# Patient Record
Sex: Male | Born: 1940 | Race: White | Hispanic: No | Smoking: Never smoker
Health system: Southern US, Community
[De-identification: ages and names within clinical notes are randomized; demographics above are authoritative.]

## PROBLEM LIST (undated history)

## (undated) DIAGNOSIS — K219 Gastro-esophageal reflux disease without esophagitis: Secondary | ICD-10-CM

## (undated) DIAGNOSIS — Z95810 Presence of automatic (implantable) cardiac defibrillator: Secondary | ICD-10-CM

## (undated) DIAGNOSIS — I219 Acute myocardial infarction, unspecified: Secondary | ICD-10-CM

## (undated) DIAGNOSIS — I10 Essential (primary) hypertension: Secondary | ICD-10-CM

## (undated) DIAGNOSIS — D369 Benign neoplasm, unspecified site: Secondary | ICD-10-CM

## (undated) DIAGNOSIS — I4891 Unspecified atrial fibrillation: Secondary | ICD-10-CM

## (undated) DIAGNOSIS — E538 Deficiency of other specified B group vitamins: Secondary | ICD-10-CM

## (undated) DIAGNOSIS — I255 Ischemic cardiomyopathy: Secondary | ICD-10-CM

## (undated) DIAGNOSIS — I499 Cardiac arrhythmia, unspecified: Secondary | ICD-10-CM

## (undated) DIAGNOSIS — D649 Anemia, unspecified: Secondary | ICD-10-CM

## (undated) DIAGNOSIS — E785 Hyperlipidemia, unspecified: Secondary | ICD-10-CM

## (undated) DIAGNOSIS — N529 Male erectile dysfunction, unspecified: Secondary | ICD-10-CM

## (undated) DIAGNOSIS — D696 Thrombocytopenia, unspecified: Secondary | ICD-10-CM

## (undated) DIAGNOSIS — G4733 Obstructive sleep apnea (adult) (pediatric): Secondary | ICD-10-CM

## (undated) DIAGNOSIS — Z8546 Personal history of malignant neoplasm of prostate: Secondary | ICD-10-CM

## (undated) DIAGNOSIS — C801 Malignant (primary) neoplasm, unspecified: Secondary | ICD-10-CM

## (undated) DIAGNOSIS — I509 Heart failure, unspecified: Secondary | ICD-10-CM

## (undated) DIAGNOSIS — C61 Malignant neoplasm of prostate: Secondary | ICD-10-CM

## (undated) DIAGNOSIS — I2510 Atherosclerotic heart disease of native coronary artery without angina pectoris: Secondary | ICD-10-CM

## (undated) DIAGNOSIS — J449 Chronic obstructive pulmonary disease, unspecified: Secondary | ICD-10-CM

## (undated) HISTORY — DX: Chronic obstructive pulmonary disease, unspecified: J44.9

## (undated) HISTORY — DX: Thrombocytopenia, unspecified: D69.6

## (undated) HISTORY — DX: Male erectile dysfunction, unspecified: N52.9

## (undated) HISTORY — DX: Ischemic cardiomyopathy: I25.5

## (undated) HISTORY — DX: Obstructive sleep apnea (adult) (pediatric): G47.33

## (undated) HISTORY — PX: CARDIAC CATHETERIZATION: SHX172

## (undated) HISTORY — DX: Personal history of malignant neoplasm of prostate: Z85.46

## (undated) HISTORY — DX: Heart failure, unspecified: I50.9

## (undated) HISTORY — DX: Malignant (primary) neoplasm, unspecified: C80.1

## (undated) HISTORY — DX: Cardiac arrhythmia, unspecified: I49.9

## (undated) HISTORY — DX: Benign neoplasm, unspecified site: D36.9

## (undated) HISTORY — DX: Unspecified atrial fibrillation: I48.91

## (undated) HISTORY — DX: Hyperlipidemia, unspecified: E78.5

## (undated) HISTORY — DX: Essential (primary) hypertension: I10

## (undated) HISTORY — DX: Anemia, unspecified: D64.9

## (undated) HISTORY — DX: Atherosclerotic heart disease of native coronary artery without angina pectoris: I25.10

## (undated) HISTORY — DX: Deficiency of other specified B group vitamins: E53.8

## (undated) HISTORY — PX: VASECTOMY: SHX75

## (undated) HISTORY — DX: Acute myocardial infarction, unspecified: I21.9

## (undated) HISTORY — DX: Malignant neoplasm of prostate: C61

## (undated) SURGERY — ICD GENERATOR CHANGE
Anesthesia: Choice | Laterality: Left

---

## 1986-12-06 DIAGNOSIS — I219 Acute myocardial infarction, unspecified: Secondary | ICD-10-CM

## 1986-12-06 HISTORY — PX: CORONARY ARTERY BYPASS GRAFT: SHX141

## 1986-12-06 HISTORY — DX: Acute myocardial infarction, unspecified: I21.9

## 1999-12-07 DIAGNOSIS — C801 Malignant (primary) neoplasm, unspecified: Secondary | ICD-10-CM

## 1999-12-07 DIAGNOSIS — Z8546 Personal history of malignant neoplasm of prostate: Secondary | ICD-10-CM

## 1999-12-07 HISTORY — DX: Personal history of malignant neoplasm of prostate: Z85.46

## 1999-12-07 HISTORY — DX: Malignant (primary) neoplasm, unspecified: C80.1

## 2006-12-28 ENCOUNTER — Encounter: Payer: Self-pay | Admitting: Internal Medicine

## 2007-01-06 ENCOUNTER — Encounter: Payer: Self-pay | Admitting: Internal Medicine

## 2007-02-04 ENCOUNTER — Encounter: Payer: Self-pay | Admitting: Internal Medicine

## 2008-04-17 ENCOUNTER — Ambulatory Visit: Payer: Self-pay | Admitting: Internal Medicine

## 2009-06-05 LAB — HM COLONOSCOPY: HM Colonoscopy: ABNORMAL

## 2009-06-12 ENCOUNTER — Ambulatory Visit: Payer: Self-pay | Admitting: Unknown Physician Specialty

## 2009-06-15 LAB — HM COLONOSCOPY: HM Colonoscopy: <1

## 2009-06-23 LAB — HM COLONOSCOPY

## 2009-11-05 ENCOUNTER — Ambulatory Visit: Payer: Self-pay | Admitting: Internal Medicine

## 2009-12-17 ENCOUNTER — Encounter: Payer: Self-pay | Admitting: Internal Medicine

## 2009-12-17 ENCOUNTER — Ambulatory Visit: Payer: Self-pay | Admitting: Internal Medicine

## 2010-01-12 ENCOUNTER — Ambulatory Visit: Payer: Self-pay | Admitting: Internal Medicine

## 2010-01-12 ENCOUNTER — Encounter: Payer: Self-pay | Admitting: Internal Medicine

## 2010-05-19 ENCOUNTER — Ambulatory Visit: Payer: Self-pay | Admitting: Internal Medicine

## 2010-09-10 ENCOUNTER — Ambulatory Visit: Payer: Self-pay | Admitting: Internal Medicine

## 2010-09-10 ENCOUNTER — Other Ambulatory Visit: Payer: Self-pay | Admitting: Internal Medicine

## 2010-12-10 ENCOUNTER — Ambulatory Visit: Payer: TRICARE For Life (TFL) | Admitting: Specialist

## 2010-12-16 LAB — HM COLONOSCOPY

## 2011-05-19 ENCOUNTER — Ambulatory Visit: Payer: TRICARE For Life (TFL) | Admitting: Specialist

## 2011-06-25 ENCOUNTER — Ambulatory Visit: Payer: TRICARE For Life (TFL) | Admitting: Ophthalmology

## 2011-07-13 ENCOUNTER — Ambulatory Visit: Payer: TRICARE For Life (TFL) | Admitting: Ophthalmology

## 2011-08-02 ENCOUNTER — Ambulatory Visit: Payer: TRICARE For Life (TFL) | Admitting: Ophthalmology

## 2011-08-05 ENCOUNTER — Other Ambulatory Visit (INDEPENDENT_AMBULATORY_CARE_PROVIDER_SITE_OTHER): Payer: Medicare Other | Admitting: *Deleted

## 2011-08-05 ENCOUNTER — Ambulatory Visit (INDEPENDENT_AMBULATORY_CARE_PROVIDER_SITE_OTHER): Payer: Medicare Other | Admitting: *Deleted

## 2011-08-05 DIAGNOSIS — E538 Deficiency of other specified B group vitamins: Secondary | ICD-10-CM

## 2011-08-05 DIAGNOSIS — Z7901 Long term (current) use of anticoagulants: Secondary | ICD-10-CM

## 2011-08-05 LAB — PROTIME-INR
INR: 1.7 ratio — ABNORMAL HIGH (ref 0.8–1.0)
Prothrombin Time: 18 s — ABNORMAL HIGH (ref 10.2–12.4)

## 2011-08-05 MED ORDER — CYANOCOBALAMIN 1000 MCG/ML IJ SOLN
1000.0000 ug | Freq: Once | INTRAMUSCULAR | Status: AC
  Administered 2011-08-05: 1000 ug via INTRAMUSCULAR

## 2011-08-16 ENCOUNTER — Other Ambulatory Visit: Payer: Self-pay | Admitting: Internal Medicine

## 2011-08-16 DIAGNOSIS — Z7901 Long term (current) use of anticoagulants: Secondary | ICD-10-CM

## 2011-08-17 ENCOUNTER — Ambulatory Visit: Payer: TRICARE For Life (TFL) | Admitting: Ophthalmology

## 2011-08-23 ENCOUNTER — Other Ambulatory Visit (INDEPENDENT_AMBULATORY_CARE_PROVIDER_SITE_OTHER): Payer: Medicare Other | Admitting: *Deleted

## 2011-08-23 DIAGNOSIS — Z7901 Long term (current) use of anticoagulants: Secondary | ICD-10-CM

## 2011-08-23 LAB — PROTIME-INR
INR: 1.9 ratio — ABNORMAL HIGH (ref 0.8–1.0)
Prothrombin Time: 19.6 s — ABNORMAL HIGH (ref 10.2–12.4)

## 2011-08-26 ENCOUNTER — Telehealth: Payer: Self-pay | Admitting: Internal Medicine

## 2011-08-26 NOTE — Telephone Encounter (Signed)
Updated patients chart °

## 2011-09-14 ENCOUNTER — Ambulatory Visit (INDEPENDENT_AMBULATORY_CARE_PROVIDER_SITE_OTHER): Payer: Medicare Other | Admitting: *Deleted

## 2011-09-14 DIAGNOSIS — E538 Deficiency of other specified B group vitamins: Secondary | ICD-10-CM

## 2011-09-14 DIAGNOSIS — Z23 Encounter for immunization: Secondary | ICD-10-CM

## 2011-09-14 MED ORDER — CYANOCOBALAMIN 1000 MCG/ML IJ SOLN
1000.0000 ug | Freq: Once | INTRAMUSCULAR | Status: AC
  Administered 2011-09-14: 1000 ug via INTRAMUSCULAR

## 2011-09-27 ENCOUNTER — Other Ambulatory Visit: Payer: Self-pay | Admitting: *Deleted

## 2011-09-27 ENCOUNTER — Ambulatory Visit: Payer: TRICARE For Life (TFL) | Admitting: Internal Medicine

## 2011-09-27 ENCOUNTER — Other Ambulatory Visit (INDEPENDENT_AMBULATORY_CARE_PROVIDER_SITE_OTHER): Payer: Medicare Other | Admitting: *Deleted

## 2011-09-27 DIAGNOSIS — Z7901 Long term (current) use of anticoagulants: Secondary | ICD-10-CM

## 2011-09-27 DIAGNOSIS — M25529 Pain in unspecified elbow: Secondary | ICD-10-CM

## 2011-09-27 LAB — PROTIME-INR
INR: 2.7 ratio — ABNORMAL HIGH (ref 0.8–1.0)
Prothrombin Time: 29.4 s — ABNORMAL HIGH (ref 10.2–12.4)

## 2011-09-27 MED ORDER — DOXAZOSIN MESYLATE 4 MG PO TABS: 4.0000 mg | ORAL_TABLET | Freq: Every day | ORAL | Status: DC

## 2011-09-27 MED ORDER — WARFARIN SODIUM 4 MG PO TABS: 4.0000 mg | ORAL_TABLET | Freq: Every day | ORAL | Status: DC

## 2011-09-30 ENCOUNTER — Other Ambulatory Visit: Payer: Self-pay | Admitting: Internal Medicine

## 2011-09-30 ENCOUNTER — Telehealth: Payer: Self-pay | Admitting: Internal Medicine

## 2011-09-30 NOTE — Telephone Encounter (Signed)
Patient notified of results.

## 2011-09-30 NOTE — Telephone Encounter (Signed)
Message copied by Andreas Ohm on Thu Sep 30, 2011  9:09 AM ------      Message from: Deborra Medina      Created: Wed Sep 29, 2011  9:09 AM      Regarding: elbow x ray       Dr walker x raye his elbow because of a fall.  There is no sign of fracture.  If the pain and welling are present after 1 more week, make appt to reeval

## 2011-10-01 MED ORDER — WARFARIN SODIUM 4 MG PO TABS: ORAL_TABLET | ORAL | Status: DC

## 2011-10-27 ENCOUNTER — Encounter: Payer: Self-pay | Admitting: Internal Medicine

## 2011-11-01 ENCOUNTER — Encounter: Payer: Self-pay | Admitting: Internal Medicine

## 2011-11-01 ENCOUNTER — Ambulatory Visit (INDEPENDENT_AMBULATORY_CARE_PROVIDER_SITE_OTHER): Payer: Medicare Other | Admitting: Internal Medicine

## 2011-11-01 VITALS — BP 100/56 | HR 92 | Temp 98.2°F | Resp 18 | Ht 75.0 in | Wt 280.5 lb

## 2011-11-01 DIAGNOSIS — Z79899 Other long term (current) drug therapy: Secondary | ICD-10-CM

## 2011-11-01 DIAGNOSIS — I2589 Other forms of chronic ischemic heart disease: Secondary | ICD-10-CM

## 2011-11-01 DIAGNOSIS — Z86010 Personal history of colonic polyps: Secondary | ICD-10-CM

## 2011-11-01 DIAGNOSIS — J40 Bronchitis, not specified as acute or chronic: Secondary | ICD-10-CM

## 2011-11-01 DIAGNOSIS — Z860101 Personal history of adenomatous and serrated colon polyps: Secondary | ICD-10-CM

## 2011-11-01 DIAGNOSIS — I255 Ischemic cardiomyopathy: Secondary | ICD-10-CM

## 2011-11-01 DIAGNOSIS — E785 Hyperlipidemia, unspecified: Secondary | ICD-10-CM

## 2011-11-01 DIAGNOSIS — Z7901 Long term (current) use of anticoagulants: Secondary | ICD-10-CM

## 2011-11-01 DIAGNOSIS — E538 Deficiency of other specified B group vitamins: Secondary | ICD-10-CM

## 2011-11-01 DIAGNOSIS — D510 Vitamin B12 deficiency anemia due to intrinsic factor deficiency: Secondary | ICD-10-CM

## 2011-11-01 MED ORDER — HYDROCODONE-ACETAMINOPHEN 5-500 MG PO TABS: 1.0000 | ORAL_TABLET | Freq: Four times a day (QID) | ORAL | Status: AC | PRN

## 2011-11-01 MED ORDER — LEVOFLOXACIN 500 MG PO TABS: 500.0000 mg | ORAL_TABLET | Freq: Every day | ORAL | Status: AC

## 2011-11-01 MED ORDER — CYANOCOBALAMIN 1000 MCG/ML IJ SOLN
1000.0000 ug | Freq: Once | INTRAMUSCULAR | Status: AC
  Administered 2011-11-01: 1000 ug via INTRAMUSCULAR

## 2011-11-01 NOTE — Progress Notes (Signed)
Subjective:    Patient ID: Rick Gilmore, male    DOB: 02-Aug-1941, 70 y.o.   MRN: [id number]  HPI 70 YO white male with history of CAD, ischemic cardiomyopathy, EF 30%, atrial fibrillation, and OSA presents with 2 wk history of productive cough without chest pain, fevers  or weight gain.  No facial pain or ear pain , no sinus congestion.  Clear  rhinorrhea.   Travelled to [facility] prior to onset of symptoms and got caught in the rain while photographing a statue of [place] around Nov 15 .    Hi is up to date on vaccines,  had flu vaccine on Oct , a well as his TdaP.Marland Kitchen Shingles, recently.. Pneumovax within the last  5 years.    Past Medical History  Diagnosis Date  . CAD (coronary artery disease)   . Atrial fibrillation     on Coumadin  . Hypertension   . Erectile dysfunction   . Hyperlipidemia   . Ischemic cardiomyopathy   . Cancer 2001    Prostate, XRT and implant  . Tubular adenoma     colon polyp  . Prostatitis    Current Outpatient Prescriptions on File Prior to Visit  Medication Sig Dispense Refill  . doxazosin (CARDURA) 4 MG tablet Take 1 tablet (4 mg total) by mouth at bedtime.  90 tablet  3  . warfarin (COUMADIN) 4 MG tablet 6 mg on Wed and  Sat,  4 mg all other days (Mon, Tues, Thurs, Fri, and Sun)  90 tablet  3  . warfarin (COUMADIN) 6 MG tablet Take 6 mg by mouth. On wednesdays and saturdays          Review of Systems  Constitutional: Positive for fever, chills and fatigue. Negative for diaphoresis, activity change, appetite change and unexpected weight change.  HENT: Negative for hearing loss, ear pain, nosebleeds, congestion, sore throat, facial swelling, rhinorrhea, sneezing, drooling, mouth sores, trouble swallowing, neck pain, neck stiffness, dental problem, voice change, postnasal drip, sinus pressure, tinnitus and ear discharge.   Eyes: Negative for photophobia, pain, discharge, redness, itching and visual disturbance.  Respiratory: Positive for cough and  shortness of breath. Negative for apnea, choking, chest tightness, wheezing and stridor.   Cardiovascular: Negative for chest pain, palpitations and leg swelling.  Gastrointestinal: Negative for nausea, vomiting, abdominal pain, diarrhea, constipation, blood in stool, abdominal distention, anal bleeding and rectal pain.  Genitourinary: Negative for dysuria, urgency, frequency, hematuria, flank pain, decreased urine volume, scrotal swelling, difficulty urinating and testicular pain.  Musculoskeletal: Negative for myalgias, back pain, joint swelling, arthralgias and gait problem.  Skin: Negative for color change, rash and wound.  Neurological: Negative for dizziness, tremors, seizures, syncope, speech difficulty, weakness, light-headedness, numbness and headaches.  Psychiatric/Behavioral: Negative for suicidal ideas, hallucinations, behavioral problems, confusion, sleep disturbance, dysphoric mood, decreased concentration and agitation. The patient is not nervous/anxious.       BP 100/56  Pulse 92  Temp(Src) 98.2 F (36.8 C) (Oral)  Resp 18  Ht 6\' 3"  (1.905 m)  Wt 280 lb 8 oz (127.234 kg)  BMI 35.06 kg/m2  SpO2 92%  Objective:   Physical Exam  Constitutional: He is oriented to person, place, and time.  HENT:  Head: Normocephalic and atraumatic.  Mouth/Throat: Oropharynx is clear and moist.  Eyes: Conjunctivae and EOM are normal.  Neck: Normal range of motion. Neck supple. No JVD present. No thyromegaly present.  Cardiovascular: Normal rate, regular rhythm and normal heart sounds.   Pulmonary/Chest: Effort  normal and breath sounds normal. He has no wheezes. He has no rales.  Abdominal: Soft. Bowel sounds are normal. He exhibits no mass. There is no tenderness. There is no rebound.  Musculoskeletal: Normal range of motion. He exhibits no edema.  Neurological: He is alert and oriented to person, place, and time.  Skin: Skin is warm and dry.  Psychiatric: He has a normal mood and affect.            Assessment & Plan:  Bronchitis:  Will treat empirically with antibiotics given length of illness and comorbidities.   Atrial fib:  Rate controlle,d  Repeat PT/INR for this Thursday.   Recent addition of spironolactone:  Will check potassium level on Thursday  Hyperliidemia:  Checking LDL on Thursday , goal < 70

## 2011-11-01 NOTE — Patient Instructions (Signed)
Return on thursday for your coumadin level instead of today  I am rx'ing levaquin (antibiotic) 1 daily for 7 days for bronchitis  The hydrocodone tablet is for cough:  One every 6 hours.    You can save the mucinex for thick sputum.  Drink lots of fluids with it.

## 2011-11-02 ENCOUNTER — Encounter: Payer: Self-pay | Admitting: Internal Medicine

## 2011-11-02 DIAGNOSIS — G4733 Obstructive sleep apnea (adult) (pediatric): Secondary | ICD-10-CM | POA: Insufficient documentation

## 2011-11-02 DIAGNOSIS — I255 Ischemic cardiomyopathy: Secondary | ICD-10-CM | POA: Insufficient documentation

## 2011-11-02 DIAGNOSIS — Z7901 Long term (current) use of anticoagulants: Secondary | ICD-10-CM | POA: Insufficient documentation

## 2011-11-02 DIAGNOSIS — Z8546 Personal history of malignant neoplasm of prostate: Secondary | ICD-10-CM | POA: Insufficient documentation

## 2011-11-02 DIAGNOSIS — D510 Vitamin B12 deficiency anemia due to intrinsic factor deficiency: Secondary | ICD-10-CM | POA: Insufficient documentation

## 2011-11-02 DIAGNOSIS — Z86010 Personal history of colonic polyps: Secondary | ICD-10-CM | POA: Insufficient documentation

## 2011-11-02 NOTE — Assessment & Plan Note (Signed)
He was recently started on spironolactone for low EF.

## 2011-11-02 NOTE — Assessment & Plan Note (Signed)
Coumadin management with monthly pT/INRs,  Next one due Thursday given use of antibiotics this week.

## 2011-11-04 ENCOUNTER — Telehealth: Payer: Self-pay | Admitting: *Deleted

## 2011-11-04 ENCOUNTER — Other Ambulatory Visit: Payer: Medicare Other

## 2011-11-04 NOTE — Telephone Encounter (Signed)
Opened in error

## 2011-11-05 ENCOUNTER — Other Ambulatory Visit (INDEPENDENT_AMBULATORY_CARE_PROVIDER_SITE_OTHER): Payer: Medicare Other | Admitting: *Deleted

## 2011-11-05 ENCOUNTER — Other Ambulatory Visit: Payer: Self-pay | Admitting: *Deleted

## 2011-11-05 DIAGNOSIS — Z79899 Other long term (current) drug therapy: Secondary | ICD-10-CM

## 2011-11-05 DIAGNOSIS — Z7901 Long term (current) use of anticoagulants: Secondary | ICD-10-CM

## 2011-11-05 DIAGNOSIS — E785 Hyperlipidemia, unspecified: Secondary | ICD-10-CM

## 2011-11-05 LAB — COMPREHENSIVE METABOLIC PANEL
ALT: 16 U/L (ref 0–53)
AST: 22 U/L (ref 0–37)
Albumin: 4.1 g/dL (ref 3.5–5.2)
Alkaline Phosphatase: 44 U/L (ref 39–117)
BUN: 18 mg/dL (ref 6–23)
CO2: 28 mEq/L (ref 19–32)
Calcium: 9 mg/dL (ref 8.4–10.5)
Chloride: 104 mEq/L (ref 96–112)
Creatinine, Ser: 1.1 mg/dL (ref 0.4–1.5)
GFR: 68.16 mL/min (ref >60.00)
Glucose, Bld: 102 mg/dL — ABNORMAL HIGH (ref 70–99)
Potassium: 4.4 mEq/L (ref 3.5–5.1)
Sodium: 138 mEq/L (ref 135–145)
Total Bilirubin: 0.6 mg/dL (ref 0.3–1.2)
Total Protein: 7.1 g/dL (ref 6.0–8.3)

## 2011-11-05 LAB — PROTIME-INR
INR: 1.9 ratio — ABNORMAL HIGH (ref 0.8–1.0)
Prothrombin Time: 21.2 s — ABNORMAL HIGH (ref 10.2–12.4)

## 2011-11-05 LAB — LDL CHOLESTEROL, DIRECT: Direct LDL: 59.9 mg/dL

## 2011-11-05 MED ORDER — WARFARIN SODIUM 4 MG PO TABS: ORAL_TABLET | ORAL | Status: DC

## 2011-11-05 MED ORDER — WARFARIN SODIUM 6 MG PO TABS: ORAL_TABLET | ORAL | Status: DC

## 2011-11-26 ENCOUNTER — Telehealth: Payer: Self-pay | Admitting: *Deleted

## 2011-11-26 NOTE — Telephone Encounter (Signed)
Patient came in office to bring you a christmas gift and said that he never heard from his pt/inr from 2 weeks ago. When I looked it up, the results are in there but they look as if it wasn't resulted to you. I explained to patient that it wasn't routed and was very apologetic, he was fine, I told him that we will call him before the end of the day.

## 2011-11-26 NOTE — Telephone Encounter (Signed)
Patient called.

## 2011-12-16 ENCOUNTER — Ambulatory Visit: Payer: Medicare Other | Admitting: *Deleted

## 2011-12-16 ENCOUNTER — Other Ambulatory Visit (INDEPENDENT_AMBULATORY_CARE_PROVIDER_SITE_OTHER): Payer: Medicare Other | Admitting: *Deleted

## 2011-12-16 DIAGNOSIS — Z7901 Long term (current) use of anticoagulants: Secondary | ICD-10-CM | POA: Diagnosis not present

## 2011-12-16 DIAGNOSIS — E538 Deficiency of other specified B group vitamins: Secondary | ICD-10-CM

## 2011-12-16 MED ORDER — CYANOCOBALAMIN 1000 MCG/ML IJ SOLN
1000.0000 ug | Freq: Once | INTRAMUSCULAR | Status: AC
  Administered 2011-12-16: 1000 ug via INTRAMUSCULAR

## 2011-12-17 LAB — PROTIME-INR
INR: 2.05 — ABNORMAL HIGH (ref <1.50)
Prothrombin Time: 23.8 seconds — ABNORMAL HIGH (ref 11.6–15.2)

## 2011-12-23 DIAGNOSIS — I5022 Chronic systolic (congestive) heart failure: Secondary | ICD-10-CM | POA: Diagnosis not present

## 2011-12-23 DIAGNOSIS — I509 Heart failure, unspecified: Secondary | ICD-10-CM | POA: Diagnosis not present

## 2011-12-23 DIAGNOSIS — I110 Hypertensive heart disease with heart failure: Secondary | ICD-10-CM | POA: Diagnosis not present

## 2011-12-23 DIAGNOSIS — I2510 Atherosclerotic heart disease of native coronary artery without angina pectoris: Secondary | ICD-10-CM | POA: Diagnosis not present

## 2011-12-23 DIAGNOSIS — E782 Mixed hyperlipidemia: Secondary | ICD-10-CM | POA: Diagnosis not present

## 2011-12-30 DIAGNOSIS — S92919A Unspecified fracture of unspecified toe(s), initial encounter for closed fracture: Secondary | ICD-10-CM | POA: Diagnosis not present

## 2012-01-11 ENCOUNTER — Encounter: Payer: Self-pay | Admitting: Internal Medicine

## 2012-01-24 ENCOUNTER — Ambulatory Visit: Payer: Medicare Other | Admitting: Internal Medicine

## 2012-02-03 ENCOUNTER — Ambulatory Visit (INDEPENDENT_AMBULATORY_CARE_PROVIDER_SITE_OTHER): Payer: Medicare Other | Admitting: Internal Medicine

## 2012-02-03 ENCOUNTER — Encounter: Payer: Self-pay | Admitting: Internal Medicine

## 2012-02-03 VITALS — BP 120/88 | HR 86 | Temp 98.6°F | Wt 279.0 lb

## 2012-02-03 DIAGNOSIS — I255 Ischemic cardiomyopathy: Secondary | ICD-10-CM

## 2012-02-03 DIAGNOSIS — Z7901 Long term (current) use of anticoagulants: Secondary | ICD-10-CM

## 2012-02-03 DIAGNOSIS — E538 Deficiency of other specified B group vitamins: Secondary | ICD-10-CM

## 2012-02-03 DIAGNOSIS — I2589 Other forms of chronic ischemic heart disease: Secondary | ICD-10-CM

## 2012-02-03 DIAGNOSIS — D510 Vitamin B12 deficiency anemia due to intrinsic factor deficiency: Secondary | ICD-10-CM

## 2012-02-03 LAB — PROTIME-INR
INR: 1.9 ratio — ABNORMAL HIGH (ref 0.8–1.0)
Prothrombin Time: 20.6 s — ABNORMAL HIGH (ref 10.2–12.4)

## 2012-02-03 MED ORDER — LISINOPRIL 10 MG PO TABS: 5.0000 mg | ORAL_TABLET | Freq: Two times a day (BID) | ORAL | Status: DC

## 2012-02-03 MED ORDER — SIMVASTATIN 20 MG PO TABS: 20.0000 mg | ORAL_TABLET | Freq: Every day | ORAL | Status: DC

## 2012-02-03 MED ORDER — SCOPOLAMINE 1 MG/3DAYS TD PT72: 1.0000 | MEDICATED_PATCH | TRANSDERMAL | Status: AC

## 2012-02-03 MED ORDER — FUROSEMIDE 80 MG PO TABS: 80.0000 mg | ORAL_TABLET | Freq: Every day | ORAL | Status: DC

## 2012-02-03 MED ORDER — METOPROLOL SUCCINATE ER 100 MG PO TB24: 100.0000 mg | ORAL_TABLET | Freq: Every day | ORAL | Status: DC

## 2012-02-03 MED ORDER — ESOMEPRAZOLE MAGNESIUM 40 MG PO CPDR: 40.0000 mg | DELAYED_RELEASE_CAPSULE | Freq: Every day | ORAL | Status: DC

## 2012-02-03 MED ORDER — CYANOCOBALAMIN 1000 MCG/ML IJ SOLN
1000.0000 ug | Freq: Once | INTRAMUSCULAR | Status: AC
  Administered 2012-02-03: 1000 ug via INTRAMUSCULAR

## 2012-02-03 NOTE — Assessment & Plan Note (Signed)
Stage III, with EF 25%, with stable symptoms.  He leads an active life in spite of his cardiomyopathy, and his weight has been stable.  No changes to regimen.

## 2012-02-03 NOTE — Patient Instructions (Addendum)
Eucerin  And Aveeno are the best OTC mositurizers.  I have given you a prescription for the motion sickness patch if you need it  Be careful not to overeat spinach, kale and broccoli on your cruise (it will lower your coumadin level)

## 2012-02-03 NOTE — Assessment & Plan Note (Signed)
His monthly INR has been stable.

## 2012-02-03 NOTE — Progress Notes (Signed)
Subjective:    Patient ID: Rick Gilmore, male    DOB: October 24, 1941, 71 y.o.   MRN: [id number]  HPI  Rick Gilmore is a 71 yr old white male with a history of ischemic cardiomyopathy, atrial fibrillation, on chronic coumadin ,  B12 deficiency who presents for followup on chronic conditions.  His weight has been stable by home surveillance and he denies chest pain, orthopnea and dyspnea at rest.   Past Medical History  Diagnosis Date  . CAD (coronary artery disease)   . Atrial fibrillation     on Coumadin  . Hypertension   . Erectile dysfunction   . Hyperlipidemia   . Ischemic cardiomyopathy     s/p AICD/pacer 2009, replaced 2010 [facility]  . Cancer 2001    Prostate, XRT and implant  . Tubular adenoma     colon polyp  . Personal history of prostate cancer 2001    s/p XRT and implant ([facility])  . Sleep apnea, obstructive     uses CPAP nightly   Current Outpatient Prescriptions on File Prior to Visit  Medication Sig Dispense Refill  . aspirin 81 MG tablet Take 81 mg by mouth daily.        Marland Kitchen doxazosin (CARDURA) 4 MG tablet Take 1 tablet (4 mg total) by mouth at bedtime.  90 tablet  3  . HYDROcodone-acetaminophen (VICODIN) 5-500 MG per tablet Take 1 tablet by mouth every 6 (six) hours as needed (cough control).  30 tablet  0  . spironolactone (ALDACTONE) 25 MG tablet Take 25 mg by mouth daily.        Marland Kitchen warfarin (COUMADIN) 4 MG tablet Take 6 mg on Monday, Wed, and Sat and 4 mg on Tues, Thursday, Friday, and Sunday.  90 tablet  0  . warfarin (COUMADIN) 6 MG tablet Take 6mg  on Monday, Wed, and Saturday  30 tablet  0   No current facility-administered medications on file prior to visit.    Review of Systems  Constitutional: Negative for fever, chills, diaphoresis, activity change, appetite change, fatigue and unexpected weight change.  HENT: Negative for hearing loss, ear pain, nosebleeds, congestion, sore throat, facial swelling, rhinorrhea, sneezing, drooling, mouth sores, trouble swallowing, neck  pain, neck stiffness, dental problem, voice change, postnasal drip, sinus pressure, tinnitus and ear discharge.   Eyes: Negative for photophobia, pain, discharge, redness, itching and visual disturbance.  Respiratory: Negative for apnea, cough, choking, chest tightness, shortness of breath, wheezing and stridor.   Cardiovascular: Negative for chest pain, palpitations and leg swelling.  Gastrointestinal: Negative for nausea, vomiting, abdominal pain, diarrhea, constipation, blood in stool, abdominal distention, anal bleeding and rectal pain.  Genitourinary: Negative for dysuria, urgency, frequency, hematuria, flank pain, decreased urine volume, scrotal swelling, difficulty urinating and testicular pain.  Musculoskeletal: Negative for myalgias, back pain, joint swelling, arthralgias and gait problem.  Skin: Negative for color change, rash and wound.  Neurological: Negative for dizziness, tremors, seizures, syncope, speech difficulty, weakness, light-headedness, numbness and headaches.  Psychiatric/Behavioral: Negative for suicidal ideas, hallucinations, behavioral problems, confusion, sleep disturbance, dysphoric mood, decreased concentration and agitation. The patient is not nervous/anxious.        Objective:   Physical Exam  Constitutional: He is oriented to person, place, and time.  HENT:  Head: Normocephalic and atraumatic.  Mouth/Throat: Oropharynx is clear and moist.  Eyes: Conjunctivae and EOM are normal.  Neck: Normal range of motion. Neck supple. No JVD present. No thyromegaly present.  Cardiovascular: Normal rate, regular rhythm and normal heart sounds.  Pulmonary/Chest: Effort normal and breath sounds normal. He has no wheezes. He has no rales.  Abdominal: Soft. Bowel sounds are normal. He exhibits no mass. There is no tenderness. There is no rebound.  Musculoskeletal: Normal range of motion. He exhibits no edema.  Neurological: He is alert and oriented to person, place, and time.    Skin: Skin is warm and dry.  Psychiatric: He has a normal mood and affect.      Assessment & Plan:   Ischemic cardiomyopathy Stage III, with EF 25%, with stable symptoms.  He leads an active life in spite of his cardiomyopathy, and his weight has been stable.  No changes to regimen.  Encounter for long-term (current) use of anticoagulants His monthly INR has been stable.   Pernicious anemia He is due for his monthly injection.     Updated Medication List Outpatient Encounter Prescriptions as of 02/03/2012  Medication Sig Dispense Refill  . aspirin 81 MG tablet Take 81 mg by mouth daily.        Marland Kitchen doxazosin (CARDURA) 4 MG tablet Take 1 tablet (4 mg total) by mouth at bedtime.  90 tablet  3  . esomeprazole (NEXIUM) 40 MG capsule Take 1 capsule (40 mg total) by mouth daily before breakfast.  90 capsule  2  . furosemide (LASIX) 80 MG tablet Take 1 tablet (80 mg total) by mouth daily.  90 tablet  2  . HYDROcodone-acetaminophen (VICODIN) 5-500 MG per tablet Take 1 tablet by mouth every 6 (six) hours as needed (cough control).  30 tablet  0  . lisinopril (PRINIVIL,ZESTRIL) 10 MG tablet Take 0.5 tablets (5 mg total) by mouth 2 (two) times daily.  180 tablet  2  . metoprolol succinate (TOPROL-XL) 100 MG 24 hr tablet Take 1 tablet (100 mg total) by mouth daily.  90 tablet  2  . simvastatin (ZOCOR) 20 MG tablet Take 1 tablet (20 mg total) by mouth at bedtime.  90 tablet  2  . spironolactone (ALDACTONE) 25 MG tablet Take 25 mg by mouth daily.        Marland Kitchen warfarin (COUMADIN) 4 MG tablet Take 6 mg on Monday, Wed, and Sat and 4 mg on Tues, Thursday, Friday, and Sunday.  90 tablet  0  . warfarin (COUMADIN) 6 MG tablet Take 6mg  on Monday, Wed, and Saturday  30 tablet  0  . DISCONTD: esomeprazole (NEXIUM) 40 MG capsule Take 40 mg by mouth daily before breakfast.        . DISCONTD: furosemide (LASIX) 80 MG tablet Take 80 mg by mouth daily.        Marland Kitchen DISCONTD: lisinopril (PRINIVIL,ZESTRIL) 10 MG tablet Take  5 mg by mouth 2 (two) times daily.        Marland Kitchen DISCONTD: metoprolol (TOPROL-XL) 100 MG 24 hr tablet Take 100 mg by mouth daily.        Marland Kitchen DISCONTD: simvastatin (ZOCOR) 20 MG tablet Take 20 mg by mouth at bedtime.        Marland Kitchen scopolamine (TRANSDERM-SCOP) 1.5 MG Place 1 patch (1.5 mg total) onto the skin every 3 (three) days.  4 patch  0   Facility-Administered Encounter Medications as of 02/03/2012  Medication Dose Route Frequency Provider Last Rate Last Dose  . cyanocobalamin ((VITAMIN B-12)) injection 1,000 mcg  1,000 mcg Intramuscular Once Deborra Medina, MD   1,000 mcg at 02/03/12 1115

## 2012-02-03 NOTE — Assessment & Plan Note (Signed)
He is due for his monthly injection.

## 2012-02-09 ENCOUNTER — Telehealth: Payer: Self-pay | Admitting: Internal Medicine

## 2012-02-09 NOTE — Telephone Encounter (Signed)
Patient called and wanted to know his coumadin level.  I asked if he got your MyChart message, he stated his computer is being repaired right now so he doesn't have access.  He stated his current coumadin regimen is 6 mg 3 days a week and 4 mg 4 days a week.  Please advise.

## 2012-02-09 NOTE — Telephone Encounter (Signed)
His level was a little low so I would increase his dose to 6 mg every day but Saturday and Tuesdays.  Take 4 mg on those two days.  Repeat INR in 2 weeks

## 2012-02-10 NOTE — Telephone Encounter (Signed)
Patient notified of new regimen, chart has been updated.

## 2012-02-28 ENCOUNTER — Other Ambulatory Visit (INDEPENDENT_AMBULATORY_CARE_PROVIDER_SITE_OTHER): Payer: Medicare Other | Admitting: *Deleted

## 2012-02-28 DIAGNOSIS — Z7901 Long term (current) use of anticoagulants: Secondary | ICD-10-CM

## 2012-02-28 LAB — PROTIME-INR
INR: 2.5 ratio — ABNORMAL HIGH (ref 0.8–1.0)
Prothrombin Time: 28.2 s — ABNORMAL HIGH (ref 10.2–12.4)

## 2012-03-10 DIAGNOSIS — M779 Enthesopathy, unspecified: Secondary | ICD-10-CM | POA: Diagnosis not present

## 2012-03-10 DIAGNOSIS — M109 Gout, unspecified: Secondary | ICD-10-CM | POA: Diagnosis not present

## 2012-03-10 DIAGNOSIS — R609 Edema, unspecified: Secondary | ICD-10-CM | POA: Diagnosis not present

## 2012-03-17 DIAGNOSIS — M109 Gout, unspecified: Secondary | ICD-10-CM | POA: Diagnosis not present

## 2012-03-20 DIAGNOSIS — H2510 Age-related nuclear cataract, unspecified eye: Secondary | ICD-10-CM | POA: Diagnosis not present

## 2012-03-20 DIAGNOSIS — Z961 Presence of intraocular lens: Secondary | ICD-10-CM | POA: Diagnosis not present

## 2012-03-30 DIAGNOSIS — I2510 Atherosclerotic heart disease of native coronary artery without angina pectoris: Secondary | ICD-10-CM | POA: Diagnosis not present

## 2012-03-30 DIAGNOSIS — I10 Essential (primary) hypertension: Secondary | ICD-10-CM | POA: Diagnosis not present

## 2012-03-30 DIAGNOSIS — I5022 Chronic systolic (congestive) heart failure: Secondary | ICD-10-CM | POA: Diagnosis not present

## 2012-03-30 DIAGNOSIS — I252 Old myocardial infarction: Secondary | ICD-10-CM | POA: Diagnosis not present

## 2012-04-04 ENCOUNTER — Other Ambulatory Visit (INDEPENDENT_AMBULATORY_CARE_PROVIDER_SITE_OTHER): Payer: Medicare Other | Admitting: *Deleted

## 2012-04-04 DIAGNOSIS — Z7901 Long term (current) use of anticoagulants: Secondary | ICD-10-CM | POA: Diagnosis not present

## 2012-04-04 DIAGNOSIS — E538 Deficiency of other specified B group vitamins: Secondary | ICD-10-CM

## 2012-04-04 LAB — PROTIME-INR
INR: 2.4 ratio — ABNORMAL HIGH (ref 0.8–1.0)
Prothrombin Time: 26 s — ABNORMAL HIGH (ref 10.2–12.4)

## 2012-04-04 MED ORDER — CYANOCOBALAMIN 1000 MCG/ML IJ SOLN
1000.0000 ug | Freq: Once | INTRAMUSCULAR | Status: AC
  Administered 2012-04-04: 1000 ug via INTRAMUSCULAR

## 2012-04-27 ENCOUNTER — Telehealth: Payer: Self-pay | Admitting: *Deleted

## 2012-04-27 ENCOUNTER — Other Ambulatory Visit (INDEPENDENT_AMBULATORY_CARE_PROVIDER_SITE_OTHER): Payer: Medicare Other

## 2012-04-27 DIAGNOSIS — Z7901 Long term (current) use of anticoagulants: Secondary | ICD-10-CM

## 2012-04-27 LAB — PROTIME-INR
INR: 2.5 ratio — ABNORMAL HIGH (ref 0.8–1.0)
Prothrombin Time: 27.3 s — ABNORMAL HIGH (ref 10.2–12.4)

## 2012-04-27 NOTE — Telephone Encounter (Signed)
Thank you :)

## 2012-04-27 NOTE — Telephone Encounter (Signed)
FYI ,Pt walked in, c/o of waking up with tablespoon of blood in his mouth. He was not in any acute distress when he arrived. He states that he past due for his inr check and he possibly took an extra dose of coumadin a few days ago. Denies any other bleeding, unusual bruising. I drew his blood for inr and sent it off with am pickup. I advised we would call with instructions when we get results.

## 2012-04-28 ENCOUNTER — Telehealth: Payer: Self-pay | Admitting: *Deleted

## 2012-04-28 ENCOUNTER — Encounter: Payer: Self-pay | Admitting: Internal Medicine

## 2012-04-28 NOTE — Telephone Encounter (Signed)
Informed patient that his levels are his 'normal therapeutic range', and that it can differ with each patient as to what the "normal range" is: requesting response from My Chart email about having the blood in mouth on 05.23.13 & if there needs to be any changes in dosages of coumadin: [facility] MESSAGE REPORT  Message [[id number]]    From  [name]   To  Crecencio Mc, MD [P [phone]   Composed  04/28/2012 12:08 AM   For Delivery On  04/28/2012 12:08 AM   Subject  Visit Follow-Up Question   Message Type  Patient Medical Advice Request   Read Status  N   Message Body  NR standard. 0.8-1.9. Mine was 2.5. ALMOST DOUBLE AND NO CHANGE??????   Protime standard. 10.4-12.4. Mine was 27.3. ALMOST TRIPLE AND NO CHANGE??????   I do not understand.   PLEASE CALL MY ASAP TO EXPLAIN!!!! !  This morning you thought .this was important enough to go to the emergency room.

## 2012-04-28 NOTE — Telephone Encounter (Signed)
I called Rick Gilmore at 5:45 PM because this is the first time I had seen this message.    Apparently a Triage Nurse told him to go to the ER when he called back to followup on the coumadin level that was therapeutic because he tried to interpret it himself.

## 2012-05-11 ENCOUNTER — Ambulatory Visit (INDEPENDENT_AMBULATORY_CARE_PROVIDER_SITE_OTHER): Payer: Medicare Other | Admitting: *Deleted

## 2012-05-11 DIAGNOSIS — E538 Deficiency of other specified B group vitamins: Secondary | ICD-10-CM

## 2012-05-11 MED ORDER — CYANOCOBALAMIN 1000 MCG/ML IJ SOLN
1000.0000 ug | Freq: Once | INTRAMUSCULAR | Status: AC
  Administered 2012-05-11: 1000 ug via INTRAMUSCULAR

## 2012-05-24 ENCOUNTER — Other Ambulatory Visit: Payer: Self-pay | Admitting: Internal Medicine

## 2012-06-14 ENCOUNTER — Other Ambulatory Visit (INDEPENDENT_AMBULATORY_CARE_PROVIDER_SITE_OTHER): Payer: Medicare Other | Admitting: *Deleted

## 2012-06-14 DIAGNOSIS — E538 Deficiency of other specified B group vitamins: Secondary | ICD-10-CM

## 2012-06-14 DIAGNOSIS — Z7901 Long term (current) use of anticoagulants: Secondary | ICD-10-CM | POA: Diagnosis not present

## 2012-06-14 LAB — PROTIME-INR
INR: 2.3 ratio — ABNORMAL HIGH (ref 0.8–1.0)
Prothrombin Time: 25.4 s — ABNORMAL HIGH (ref 10.2–12.4)

## 2012-06-14 MED ORDER — CYANOCOBALAMIN 1000 MCG/ML IJ SOLN
1000.0000 ug | Freq: Once | INTRAMUSCULAR | Status: AC
  Administered 2012-06-14: 1000 ug via INTRAMUSCULAR

## 2012-07-11 DIAGNOSIS — I519 Heart disease, unspecified: Secondary | ICD-10-CM | POA: Diagnosis not present

## 2012-07-11 DIAGNOSIS — I2789 Other specified pulmonary heart diseases: Secondary | ICD-10-CM | POA: Diagnosis not present

## 2012-07-11 DIAGNOSIS — I059 Rheumatic mitral valve disease, unspecified: Secondary | ICD-10-CM | POA: Diagnosis not present

## 2012-07-11 DIAGNOSIS — Z95810 Presence of automatic (implantable) cardiac defibrillator: Secondary | ICD-10-CM | POA: Diagnosis not present

## 2012-07-21 ENCOUNTER — Other Ambulatory Visit (INDEPENDENT_AMBULATORY_CARE_PROVIDER_SITE_OTHER): Payer: Medicare Other | Admitting: *Deleted

## 2012-07-21 ENCOUNTER — Other Ambulatory Visit: Payer: Self-pay | Admitting: *Deleted

## 2012-07-21 DIAGNOSIS — Z7901 Long term (current) use of anticoagulants: Secondary | ICD-10-CM | POA: Diagnosis not present

## 2012-07-21 LAB — PROTIME-INR
INR: 2.4 ratio — ABNORMAL HIGH (ref 0.8–1.0)
Prothrombin Time: 26.2 s — ABNORMAL HIGH (ref 10.2–12.4)

## 2012-07-21 MED ORDER — DOXAZOSIN MESYLATE 4 MG PO TABS: 4.0000 mg | ORAL_TABLET | Freq: Every day | ORAL | Status: DC

## 2012-08-14 DIAGNOSIS — C61 Malignant neoplasm of prostate: Secondary | ICD-10-CM | POA: Diagnosis not present

## 2012-08-14 DIAGNOSIS — N139 Obstructive and reflux uropathy, unspecified: Secondary | ICD-10-CM | POA: Diagnosis not present

## 2012-08-14 DIAGNOSIS — R339 Retention of urine, unspecified: Secondary | ICD-10-CM | POA: Diagnosis not present

## 2012-08-31 ENCOUNTER — Other Ambulatory Visit (INDEPENDENT_AMBULATORY_CARE_PROVIDER_SITE_OTHER): Payer: Medicare Other

## 2012-08-31 ENCOUNTER — Ambulatory Visit (INDEPENDENT_AMBULATORY_CARE_PROVIDER_SITE_OTHER): Payer: Medicare Other | Admitting: Internal Medicine

## 2012-08-31 ENCOUNTER — Encounter: Payer: Self-pay | Admitting: Internal Medicine

## 2012-08-31 DIAGNOSIS — E538 Deficiency of other specified B group vitamins: Secondary | ICD-10-CM

## 2012-08-31 DIAGNOSIS — Z7901 Long term (current) use of anticoagulants: Secondary | ICD-10-CM

## 2012-08-31 DIAGNOSIS — Z23 Encounter for immunization: Secondary | ICD-10-CM | POA: Diagnosis not present

## 2012-08-31 LAB — PROTIME-INR
INR: 1.8 ratio — ABNORMAL HIGH (ref 0.8–1.0)
Prothrombin Time: 19.2 s — ABNORMAL HIGH (ref 10.2–12.4)

## 2012-08-31 MED ORDER — CYANOCOBALAMIN 1000 MCG/ML IJ SOLN
1000.0000 ug | Freq: Once | INTRAMUSCULAR | Status: AC
  Administered 2012-08-31: 1000 ug via INTRAMUSCULAR

## 2012-08-31 NOTE — Addendum Note (Signed)
Addended by: Deborra Medina on: 08/31/2012 03:26 PM   Modules accepted: Orders

## 2012-09-08 ENCOUNTER — Telehealth: Payer: Self-pay | Admitting: Internal Medicine

## 2012-09-08 NOTE — Telephone Encounter (Signed)
Pt called checking on his protime results

## 2012-09-11 NOTE — Telephone Encounter (Signed)
Called patient gave him his lab results. He stated that he has not missed any doses.

## 2012-09-11 NOTE — Telephone Encounter (Signed)
Spoke to patient, he did not receive the message and he stated that from this day forward he would like to be contacted by phone instead of the my chart. I gave him instructions as directed.

## 2012-09-11 NOTE — Telephone Encounter (Signed)
He was sent an e mail by me on Sept 26 th with instructions on changing his coumadin dose on repeat INR in 2 weeks. Please review with patient .  Please have him check his e mail to see if he got it and whether he wants to use MyCahrt in the future or receive a telephone call

## 2012-09-26 ENCOUNTER — Other Ambulatory Visit (INDEPENDENT_AMBULATORY_CARE_PROVIDER_SITE_OTHER): Payer: Medicare Other

## 2012-09-26 DIAGNOSIS — Z7901 Long term (current) use of anticoagulants: Secondary | ICD-10-CM | POA: Diagnosis not present

## 2012-09-27 LAB — PROTIME-INR
INR: 3.3 ratio — ABNORMAL HIGH (ref 0.8–1.0)
Prothrombin Time: 33.7 s — ABNORMAL HIGH (ref 10.2–12.4)

## 2012-09-28 ENCOUNTER — Encounter: Payer: Self-pay | Admitting: Internal Medicine

## 2012-10-04 ENCOUNTER — Telehealth: Payer: Self-pay

## 2012-10-04 NOTE — Telephone Encounter (Signed)
Patient came in to office stated that he has been taking 10 mg tablets of lisinopril twice a day. Hi directions says take 1/2 5mg  twice daily. He wants to know if he can just 1 10 mg tablet per day. Please advise respond to him through my chart.

## 2012-10-26 ENCOUNTER — Other Ambulatory Visit: Payer: Self-pay | Admitting: Internal Medicine

## 2012-10-26 ENCOUNTER — Ambulatory Visit (INDEPENDENT_AMBULATORY_CARE_PROVIDER_SITE_OTHER): Payer: Medicare Other | Admitting: *Deleted

## 2012-10-26 ENCOUNTER — Other Ambulatory Visit (INDEPENDENT_AMBULATORY_CARE_PROVIDER_SITE_OTHER): Payer: Medicare Other

## 2012-10-26 DIAGNOSIS — E538 Deficiency of other specified B group vitamins: Secondary | ICD-10-CM | POA: Diagnosis not present

## 2012-10-26 DIAGNOSIS — I059 Rheumatic mitral valve disease, unspecified: Secondary | ICD-10-CM | POA: Diagnosis not present

## 2012-10-26 DIAGNOSIS — Z7901 Long term (current) use of anticoagulants: Secondary | ICD-10-CM | POA: Diagnosis not present

## 2012-10-26 DIAGNOSIS — I519 Heart disease, unspecified: Secondary | ICD-10-CM | POA: Diagnosis not present

## 2012-10-26 DIAGNOSIS — I5022 Chronic systolic (congestive) heart failure: Secondary | ICD-10-CM | POA: Diagnosis not present

## 2012-10-26 DIAGNOSIS — I2510 Atherosclerotic heart disease of native coronary artery without angina pectoris: Secondary | ICD-10-CM | POA: Diagnosis not present

## 2012-10-26 LAB — PROTIME-INR
INR: 2.2 ratio — ABNORMAL HIGH (ref 0.8–1.0)
Prothrombin Time: 23.3 s — ABNORMAL HIGH (ref 10.2–12.4)

## 2012-10-26 MED ORDER — CYANOCOBALAMIN 1000 MCG/ML IJ SOLN
1000.0000 ug | Freq: Once | INTRAMUSCULAR | Status: AC
  Administered 2012-10-26: 1000 ug via INTRAMUSCULAR

## 2012-10-26 NOTE — Telephone Encounter (Signed)
Dr. Derrel Nip,  Last OV was 01-14-12 Requesting refills for Nexium , Lasix, Metoprolol Succinate and Zocor. Refill?

## 2012-11-10 DIAGNOSIS — M109 Gout, unspecified: Secondary | ICD-10-CM | POA: Diagnosis not present

## 2012-11-10 DIAGNOSIS — M7750 Other enthesopathy of unspecified foot: Secondary | ICD-10-CM | POA: Diagnosis not present

## 2012-11-17 DIAGNOSIS — M109 Gout, unspecified: Secondary | ICD-10-CM | POA: Diagnosis not present

## 2012-12-01 ENCOUNTER — Other Ambulatory Visit (INDEPENDENT_AMBULATORY_CARE_PROVIDER_SITE_OTHER): Payer: Medicare Other

## 2012-12-01 ENCOUNTER — Telehealth: Payer: Self-pay | Admitting: Internal Medicine

## 2012-12-01 DIAGNOSIS — Z79899 Other long term (current) drug therapy: Secondary | ICD-10-CM | POA: Diagnosis not present

## 2012-12-01 DIAGNOSIS — Z7901 Long term (current) use of anticoagulants: Secondary | ICD-10-CM

## 2012-12-01 LAB — PROTIME-INR
INR: 2 ratio — ABNORMAL HIGH (ref 0.8–1.0)
Prothrombin Time: 21.3 s — ABNORMAL HIGH (ref 10.2–12.4)

## 2012-12-01 NOTE — Telephone Encounter (Signed)
Please let pt know when b12 comes in

## 2012-12-04 DIAGNOSIS — M7750 Other enthesopathy of unspecified foot: Secondary | ICD-10-CM | POA: Diagnosis not present

## 2012-12-05 NOTE — Telephone Encounter (Signed)
Left message for pt call office

## 2012-12-05 NOTE — Telephone Encounter (Signed)
Pt out of town.  Pt will call back when in gets back in town to schedule

## 2012-12-05 NOTE — Telephone Encounter (Signed)
We have B12 now if he wants to be scheduled.

## 2012-12-06 HISTORY — PX: INSERT / REPLACE / REMOVE PACEMAKER: SUR710

## 2012-12-15 ENCOUNTER — Ambulatory Visit (INDEPENDENT_AMBULATORY_CARE_PROVIDER_SITE_OTHER): Payer: Medicare Other | Admitting: *Deleted

## 2012-12-15 DIAGNOSIS — D510 Vitamin B12 deficiency anemia due to intrinsic factor deficiency: Secondary | ICD-10-CM | POA: Diagnosis not present

## 2012-12-15 MED ORDER — CYANOCOBALAMIN 1000 MCG/ML IJ SOLN
1000.0000 ug | Freq: Once | INTRAMUSCULAR | Status: AC
  Administered 2012-12-15: 1000 ug via INTRAMUSCULAR

## 2013-01-09 DIAGNOSIS — I5020 Unspecified systolic (congestive) heart failure: Secondary | ICD-10-CM | POA: Diagnosis not present

## 2013-01-09 DIAGNOSIS — I5022 Chronic systolic (congestive) heart failure: Secondary | ICD-10-CM | POA: Diagnosis not present

## 2013-01-09 DIAGNOSIS — I059 Rheumatic mitral valve disease, unspecified: Secondary | ICD-10-CM | POA: Diagnosis not present

## 2013-01-09 DIAGNOSIS — I4891 Unspecified atrial fibrillation: Secondary | ICD-10-CM | POA: Diagnosis not present

## 2013-01-09 DIAGNOSIS — I10 Essential (primary) hypertension: Secondary | ICD-10-CM | POA: Diagnosis not present

## 2013-01-09 DIAGNOSIS — I2510 Atherosclerotic heart disease of native coronary artery without angina pectoris: Secondary | ICD-10-CM | POA: Diagnosis not present

## 2013-01-25 ENCOUNTER — Other Ambulatory Visit (INDEPENDENT_AMBULATORY_CARE_PROVIDER_SITE_OTHER): Payer: Medicare Other

## 2013-01-25 DIAGNOSIS — Z7901 Long term (current) use of anticoagulants: Secondary | ICD-10-CM | POA: Diagnosis not present

## 2013-01-25 LAB — PROTIME-INR
INR: 1.8 ratio — ABNORMAL HIGH (ref 0.8–1.0)
Prothrombin Time: 19.2 s — ABNORMAL HIGH (ref 10.2–12.4)

## 2013-01-26 ENCOUNTER — Encounter: Payer: Self-pay | Admitting: Internal Medicine

## 2013-01-28 ENCOUNTER — Other Ambulatory Visit: Payer: Self-pay | Admitting: Internal Medicine

## 2013-01-30 ENCOUNTER — Other Ambulatory Visit: Payer: Self-pay | Admitting: Internal Medicine

## 2013-01-30 NOTE — Telephone Encounter (Signed)
Pt has not been seen since 02/03/12 and had an abnormal INR on 01/25/13. Please advise.

## 2013-02-06 DIAGNOSIS — I4891 Unspecified atrial fibrillation: Secondary | ICD-10-CM | POA: Diagnosis not present

## 2013-02-07 ENCOUNTER — Encounter: Payer: Self-pay | Admitting: Internal Medicine

## 2013-02-13 DIAGNOSIS — M109 Gout, unspecified: Secondary | ICD-10-CM | POA: Diagnosis not present

## 2013-02-16 DIAGNOSIS — L923 Foreign body granuloma of the skin and subcutaneous tissue: Secondary | ICD-10-CM | POA: Diagnosis not present

## 2013-02-16 DIAGNOSIS — M109 Gout, unspecified: Secondary | ICD-10-CM | POA: Diagnosis not present

## 2013-02-21 ENCOUNTER — Telehealth: Payer: Self-pay | Admitting: Internal Medicine

## 2013-02-21 DIAGNOSIS — R5383 Other fatigue: Secondary | ICD-10-CM

## 2013-02-21 DIAGNOSIS — I2510 Atherosclerotic heart disease of native coronary artery without angina pectoris: Secondary | ICD-10-CM | POA: Diagnosis not present

## 2013-02-21 DIAGNOSIS — Z79899 Other long term (current) drug therapy: Secondary | ICD-10-CM

## 2013-02-21 DIAGNOSIS — I5022 Chronic systolic (congestive) heart failure: Secondary | ICD-10-CM | POA: Diagnosis not present

## 2013-02-21 DIAGNOSIS — I059 Rheumatic mitral valve disease, unspecified: Secondary | ICD-10-CM | POA: Diagnosis not present

## 2013-02-21 DIAGNOSIS — I4891 Unspecified atrial fibrillation: Secondary | ICD-10-CM | POA: Diagnosis not present

## 2013-02-21 DIAGNOSIS — I25810 Atherosclerosis of coronary artery bypass graft(s) without angina pectoris: Secondary | ICD-10-CM

## 2013-02-21 DIAGNOSIS — Z7901 Long term (current) use of anticoagulants: Secondary | ICD-10-CM

## 2013-02-21 DIAGNOSIS — R5381 Other malaise: Secondary | ICD-10-CM

## 2013-02-21 DIAGNOSIS — D510 Vitamin B12 deficiency anemia due to intrinsic factor deficiency: Secondary | ICD-10-CM

## 2013-02-21 NOTE — Telephone Encounter (Signed)
Can you please place these orders.

## 2013-02-21 NOTE — Telephone Encounter (Signed)
Pt advised and stated he would get fasting lipids done tomorrow.

## 2013-02-21 NOTE — Telephone Encounter (Signed)
Pt made lab appt for 3/20 to have Coumadin check.  Pt also asking for B12.

## 2013-02-21 NOTE — Telephone Encounter (Signed)
He needs more than that upon review of chart.  I have added others that are needed including fast lipids if he is coming in the am. Please notify patient

## 2013-02-22 ENCOUNTER — Other Ambulatory Visit (INDEPENDENT_AMBULATORY_CARE_PROVIDER_SITE_OTHER): Payer: Medicare Other

## 2013-02-22 DIAGNOSIS — Z7901 Long term (current) use of anticoagulants: Secondary | ICD-10-CM | POA: Diagnosis not present

## 2013-02-22 DIAGNOSIS — I25810 Atherosclerosis of coronary artery bypass graft(s) without angina pectoris: Secondary | ICD-10-CM

## 2013-02-22 DIAGNOSIS — R5381 Other malaise: Secondary | ICD-10-CM

## 2013-02-22 DIAGNOSIS — Z79899 Other long term (current) drug therapy: Secondary | ICD-10-CM

## 2013-02-22 DIAGNOSIS — D510 Vitamin B12 deficiency anemia due to intrinsic factor deficiency: Secondary | ICD-10-CM

## 2013-02-22 DIAGNOSIS — R5383 Other fatigue: Secondary | ICD-10-CM | POA: Diagnosis not present

## 2013-02-22 LAB — COMPREHENSIVE METABOLIC PANEL
ALT: 16 U/L (ref 0–53)
AST: 16 U/L (ref 0–37)
Albumin: 4.2 g/dL (ref 3.5–5.2)
Alkaline Phosphatase: 43 U/L (ref 39–117)
BUN: 24 mg/dL — ABNORMAL HIGH (ref 6–23)
CO2: 26 mEq/L (ref 19–32)
Calcium: 9 mg/dL (ref 8.4–10.5)
Chloride: 103 mEq/L (ref 96–112)
Creatinine, Ser: 1.4 mg/dL (ref 0.4–1.5)
GFR: 55.3 mL/min — ABNORMAL LOW (ref >60.00)
Glucose, Bld: 113 mg/dL — ABNORMAL HIGH (ref 70–99)
Potassium: 4.4 mEq/L (ref 3.5–5.1)
Sodium: 137 mEq/L (ref 135–145)
Total Bilirubin: 1 mg/dL (ref 0.3–1.2)
Total Protein: 7 g/dL (ref 6.0–8.3)

## 2013-02-22 LAB — LIPID PANEL
Cholesterol: 109 mg/dL (ref 0–200)
HDL: 28.3 mg/dL — ABNORMAL LOW (ref >39.00)
LDL Cholesterol: 60 mg/dL (ref 0–99)
Total CHOL/HDL Ratio: 4
Triglycerides: 104 mg/dL (ref 0.0–149.0)
VLDL: 20.8 mg/dL (ref 0.0–40.0)

## 2013-02-22 LAB — VITAMIN B12: Vitamin B-12: 347 pg/mL (ref 211–911)

## 2013-02-22 LAB — PROTIME-INR
INR: 2.1 ratio — ABNORMAL HIGH (ref 0.8–1.0)
Prothrombin Time: 22.2 s — ABNORMAL HIGH (ref 10.2–12.4)

## 2013-02-22 LAB — TSH: TSH: 2.11 u[IU]/mL (ref 0.35–5.50)

## 2013-02-23 ENCOUNTER — Encounter: Payer: Self-pay | Admitting: Internal Medicine

## 2013-03-06 HISTORY — PX: IMPLANTABLE CARDIOVERTER DEFIBRILLATOR GENERATOR CHANGE: SHX5859

## 2013-03-12 ENCOUNTER — Ambulatory Visit: Payer: Self-pay | Admitting: Cardiovascular Disease

## 2013-03-12 DIAGNOSIS — I10 Essential (primary) hypertension: Secondary | ICD-10-CM | POA: Diagnosis not present

## 2013-03-12 DIAGNOSIS — Z01818 Encounter for other preprocedural examination: Secondary | ICD-10-CM | POA: Diagnosis not present

## 2013-03-12 DIAGNOSIS — I428 Other cardiomyopathies: Secondary | ICD-10-CM | POA: Diagnosis not present

## 2013-03-12 DIAGNOSIS — E785 Hyperlipidemia, unspecified: Secondary | ICD-10-CM | POA: Diagnosis not present

## 2013-03-12 DIAGNOSIS — Z01812 Encounter for preprocedural laboratory examination: Secondary | ICD-10-CM | POA: Diagnosis not present

## 2013-03-12 DIAGNOSIS — R0602 Shortness of breath: Secondary | ICD-10-CM | POA: Diagnosis not present

## 2013-03-12 DIAGNOSIS — I4891 Unspecified atrial fibrillation: Secondary | ICD-10-CM | POA: Diagnosis not present

## 2013-03-12 DIAGNOSIS — Z01810 Encounter for preprocedural cardiovascular examination: Secondary | ICD-10-CM | POA: Diagnosis not present

## 2013-03-12 LAB — CBC WITH DIFFERENTIAL/PLATELET
Basophil #: 0 10*3/uL (ref 0.0–0.1)
Basophil %: 0.6 %
Eosinophil #: 0.1 10*3/uL (ref 0.0–0.7)
Eosinophil %: 1.3 %
HCT: 31.6 % — ABNORMAL LOW (ref 40.0–52.0)
HGB: 10.5 g/dL — ABNORMAL LOW (ref 13.0–18.0)
Lymphocyte #: 1.2 10*3/uL (ref 1.0–3.6)
Lymphocyte %: 17.7 %
MCH: 31.8 pg (ref 26.0–34.0)
MCHC: 33.2 g/dL (ref 32.0–36.0)
MCV: 96 fL (ref 80–100)
Monocyte #: 0.7 x10 3/mm (ref 0.2–1.0)
Monocyte %: 10.1 %
Neutrophil #: 4.7 10*3/uL (ref 1.4–6.5)
Neutrophil %: 70.3 %
Platelet: 126 10*3/uL — ABNORMAL LOW (ref 150–440)
RBC: 3.31 10*6/uL — ABNORMAL LOW (ref 4.40–5.90)
RDW: 15.3 % — ABNORMAL HIGH (ref 11.5–14.5)
WBC: 6.7 10*3/uL (ref 3.8–10.6)

## 2013-03-12 LAB — BASIC METABOLIC PANEL
Anion Gap: 5 — ABNORMAL LOW (ref 7–16)
BUN: 20 mg/dL — ABNORMAL HIGH (ref 7–18)
Calcium, Total: 8.9 mg/dL (ref 8.5–10.1)
Chloride: 103 mmol/L (ref 98–107)
Co2: 29 mmol/L (ref 21–32)
Creatinine: 1.34 mg/dL — ABNORMAL HIGH (ref 0.60–1.30)
EGFR (African American): >60
EGFR (Non-African Amer.): 53 — ABNORMAL LOW
Glucose: 100 mg/dL — ABNORMAL HIGH (ref 65–99)
Osmolality: 277 (ref 275–301)
Potassium: 4.5 mmol/L (ref 3.5–5.1)
Sodium: 137 mmol/L (ref 136–145)

## 2013-03-12 LAB — PROTIME-INR
INR: 2.2
Prothrombin Time: 23.7 secs — ABNORMAL HIGH (ref 11.5–14.7)

## 2013-03-12 LAB — APTT: Activated PTT: 50.6 secs — ABNORMAL HIGH (ref 23.6–35.9)

## 2013-03-13 ENCOUNTER — Encounter: Payer: Self-pay | Admitting: Internal Medicine

## 2013-03-14 ENCOUNTER — Encounter: Payer: Self-pay | Admitting: Internal Medicine

## 2013-03-16 ENCOUNTER — Ambulatory Visit: Payer: Self-pay | Admitting: Cardiovascular Disease

## 2013-03-16 DIAGNOSIS — Z7982 Long term (current) use of aspirin: Secondary | ICD-10-CM | POA: Diagnosis not present

## 2013-03-16 DIAGNOSIS — Z8546 Personal history of malignant neoplasm of prostate: Secondary | ICD-10-CM | POA: Diagnosis not present

## 2013-03-16 DIAGNOSIS — Z4502 Encounter for adjustment and management of automatic implantable cardiac defibrillator: Secondary | ICD-10-CM | POA: Diagnosis not present

## 2013-03-16 DIAGNOSIS — I4891 Unspecified atrial fibrillation: Secondary | ICD-10-CM | POA: Diagnosis not present

## 2013-03-16 DIAGNOSIS — I252 Old myocardial infarction: Secondary | ICD-10-CM | POA: Diagnosis not present

## 2013-03-16 DIAGNOSIS — E785 Hyperlipidemia, unspecified: Secondary | ICD-10-CM | POA: Diagnosis not present

## 2013-03-16 DIAGNOSIS — I447 Left bundle-branch block, unspecified: Secondary | ICD-10-CM | POA: Diagnosis not present

## 2013-03-16 DIAGNOSIS — I5022 Chronic systolic (congestive) heart failure: Secondary | ICD-10-CM | POA: Diagnosis not present

## 2013-03-16 DIAGNOSIS — Z45018 Encounter for adjustment and management of other part of cardiac pacemaker: Secondary | ICD-10-CM | POA: Diagnosis not present

## 2013-03-16 DIAGNOSIS — G4730 Sleep apnea, unspecified: Secondary | ICD-10-CM | POA: Diagnosis not present

## 2013-03-16 DIAGNOSIS — I2589 Other forms of chronic ischemic heart disease: Secondary | ICD-10-CM | POA: Diagnosis not present

## 2013-03-16 DIAGNOSIS — Z7901 Long term (current) use of anticoagulants: Secondary | ICD-10-CM | POA: Diagnosis not present

## 2013-03-16 DIAGNOSIS — Z951 Presence of aortocoronary bypass graft: Secondary | ICD-10-CM | POA: Diagnosis not present

## 2013-03-16 DIAGNOSIS — Z79899 Other long term (current) drug therapy: Secondary | ICD-10-CM | POA: Diagnosis not present

## 2013-03-16 DIAGNOSIS — R609 Edema, unspecified: Secondary | ICD-10-CM | POA: Diagnosis not present

## 2013-03-16 DIAGNOSIS — I10 Essential (primary) hypertension: Secondary | ICD-10-CM | POA: Diagnosis not present

## 2013-03-16 LAB — PROTIME-INR
INR: 1.3
Prothrombin Time: 16.4 secs — ABNORMAL HIGH (ref 11.5–14.7)

## 2013-03-16 LAB — APTT: Activated PTT: 39.3 secs — ABNORMAL HIGH (ref 23.6–35.9)

## 2013-03-30 DIAGNOSIS — I10 Essential (primary) hypertension: Secondary | ICD-10-CM | POA: Diagnosis not present

## 2013-03-30 DIAGNOSIS — I2510 Atherosclerotic heart disease of native coronary artery without angina pectoris: Secondary | ICD-10-CM | POA: Diagnosis not present

## 2013-03-30 DIAGNOSIS — E785 Hyperlipidemia, unspecified: Secondary | ICD-10-CM | POA: Diagnosis not present

## 2013-03-30 DIAGNOSIS — G4730 Sleep apnea, unspecified: Secondary | ICD-10-CM | POA: Diagnosis not present

## 2013-04-10 ENCOUNTER — Other Ambulatory Visit (INDEPENDENT_AMBULATORY_CARE_PROVIDER_SITE_OTHER): Payer: Medicare Other

## 2013-04-10 DIAGNOSIS — Z7901 Long term (current) use of anticoagulants: Secondary | ICD-10-CM

## 2013-04-10 LAB — PROTIME-INR
INR: 2.8 ratio — ABNORMAL HIGH (ref 0.8–1.0)
Prothrombin Time: 29.4 s — ABNORMAL HIGH (ref 10.2–12.4)

## 2013-04-11 ENCOUNTER — Encounter: Payer: Self-pay | Admitting: Internal Medicine

## 2013-04-26 ENCOUNTER — Encounter: Payer: Self-pay | Admitting: Adult Health

## 2013-04-26 ENCOUNTER — Telehealth: Payer: Self-pay | Admitting: *Deleted

## 2013-04-26 ENCOUNTER — Ambulatory Visit (INDEPENDENT_AMBULATORY_CARE_PROVIDER_SITE_OTHER): Payer: Medicare Other | Admitting: Adult Health

## 2013-04-26 VITALS — BP 112/58 | HR 67 | Resp 12 | Wt 295.5 lb

## 2013-04-26 DIAGNOSIS — B369 Superficial mycosis, unspecified: Secondary | ICD-10-CM

## 2013-04-26 DIAGNOSIS — Z79899 Other long term (current) drug therapy: Secondary | ICD-10-CM

## 2013-04-26 DIAGNOSIS — Z7901 Long term (current) use of anticoagulants: Secondary | ICD-10-CM

## 2013-04-26 MED ORDER — KETOCONAZOLE 200 MG PO TABS: 200.0000 mg | ORAL_TABLET | Freq: Every day | ORAL | Status: DC

## 2013-04-26 NOTE — Patient Instructions (Addendum)
  Start Ketoconazole 200 mg twice a day for 14 days.  Hold the Nexium & Simvastatin while taking this medication. Resume right after you are done.  Return for PT/INR tomorrow at around 1:30 pm then return again on Tuesday morning.

## 2013-04-26 NOTE — Telephone Encounter (Signed)
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    Please call pt when this has been called into Pharmacy   [facility] [phone]

## 2013-04-26 NOTE — Telephone Encounter (Signed)
I can't do this without a visit.,  I have no record of the "fungus" in his chart and I do not know what I am treating.  He will have to be seen by me or another provider.

## 2013-04-26 NOTE — Telephone Encounter (Signed)
Patient has an appointment to see Rick Gilmore at 3:45 today .

## 2013-04-27 ENCOUNTER — Other Ambulatory Visit (INDEPENDENT_AMBULATORY_CARE_PROVIDER_SITE_OTHER): Payer: Medicare Other

## 2013-04-27 ENCOUNTER — Encounter: Payer: Self-pay | Admitting: Adult Health

## 2013-04-27 DIAGNOSIS — Z7901 Long term (current) use of anticoagulants: Secondary | ICD-10-CM | POA: Diagnosis not present

## 2013-04-27 DIAGNOSIS — B369 Superficial mycosis, unspecified: Secondary | ICD-10-CM | POA: Insufficient documentation

## 2013-04-27 LAB — PROTIME-INR
INR: 3.2 ratio — ABNORMAL HIGH (ref 0.8–1.0)
Prothrombin Time: 33.1 s — ABNORMAL HIGH (ref 10.2–12.4)

## 2013-04-27 NOTE — Progress Notes (Signed)
  Subjective:    Patient ID: Rick Gilmore, male    DOB: 01-28-1941, 72 y.o.   MRN: [id number]  HPI  Patient is a pleasant 72 year old male who presents to clinic for refill of medication, ketoconazole, for recurrent fungal infection of upper arm. Patient reports he usually has a yearly occurrence around this time every year. He he uses ketoconazole with good results however he has run out of this medication. Otherwise, patient is feeling well. He reports planning to go on vacation to the beach and would like this taken care of prior to leaving.   Current Outpatient Prescriptions on File Prior to Visit  Medication Sig Dispense Refill  . aspirin 81 MG tablet Take 81 mg by mouth daily.        Marland Kitchen doxazosin (CARDURA) 4 MG tablet Take 1 tablet (4 mg total) by mouth at bedtime.  90 tablet  3  . furosemide (LASIX) 80 MG tablet TAKE 1 TABLET BY MOUTH DAILY  90 tablet  3  . lisinopril (PRINIVIL,ZESTRIL) 10 MG tablet TAKE 1/2 TABLET (5MG ) BY MOUTH 2 TIMES DAILY  90 tablet  1  . metoprolol succinate (TOPROL-XL) 100 MG 24 hr tablet TAKE 1 TABLET BY MOUTH DAILY  90 tablet  3  . NEXIUM 40 MG capsule TAKE 1 CAPSULE BY MOUTH DAILY BEFORE BREAKFAST  90 capsule  3  . simvastatin (ZOCOR) 20 MG tablet TAKE 1 TABLET BY MOUTH AT BEDTIME  90 tablet  3  . warfarin (COUMADIN) 4 MG tablet TAKE 6MG  (1 AND 1/2 TABLETS) ON WEDNESDAY AND SATURDAY AND 4 MG(1 TABLET) ON ALL OTHER DAYS  90 tablet  2  . warfarin (COUMADIN) 6 MG tablet        No current facility-administered medications on file prior to visit.     Review of Systems  Skin:       Small fungal infection of the left upper arm  All other systems reviewed and are negative.       Objective:   Physical Exam  Constitutional: He is oriented to person, place, and time. He appears well-developed and well-nourished. No distress.  Cardiovascular: Normal rate and regular rhythm.   Pulmonary/Chest: Effort normal and breath sounds normal.  Neurological: He is alert  and oriented to person, place, and time.  Skin: Skin is warm and dry.  Approximate 1 cm white patch on the left upper arm. Not really noticeable unless pointed out by patient.  Psychiatric: He has a normal mood and affect. His behavior is normal. Thought content normal.          Assessment & Plan:

## 2013-04-27 NOTE — Assessment & Plan Note (Addendum)
Ketoconazole 200 mg twice a day x14 days. Hold Nexium and simvastatin while taking ketoconazole. Nexium inactivates ketoconazole and ketoconazole may potentiate simvastatin causing myalgias. PT/INR tomorrow afternoon and Tuesday morning as medication may increase its effects of Coumadin.

## 2013-05-01 ENCOUNTER — Other Ambulatory Visit (INDEPENDENT_AMBULATORY_CARE_PROVIDER_SITE_OTHER): Payer: Medicare Other

## 2013-05-01 ENCOUNTER — Telehealth: Payer: Self-pay | Admitting: Internal Medicine

## 2013-05-01 DIAGNOSIS — Z7901 Long term (current) use of anticoagulants: Secondary | ICD-10-CM

## 2013-05-01 DIAGNOSIS — R0600 Dyspnea, unspecified: Secondary | ICD-10-CM

## 2013-05-01 DIAGNOSIS — R0609 Other forms of dyspnea: Secondary | ICD-10-CM

## 2013-05-01 DIAGNOSIS — R0989 Other specified symptoms and signs involving the circulatory and respiratory systems: Secondary | ICD-10-CM

## 2013-05-01 DIAGNOSIS — Z79899 Other long term (current) drug therapy: Secondary | ICD-10-CM

## 2013-05-01 DIAGNOSIS — R0689 Other abnormalities of breathing: Secondary | ICD-10-CM

## 2013-05-01 LAB — COMPREHENSIVE METABOLIC PANEL
ALT: 13 U/L (ref 0–53)
AST: 18 U/L (ref 0–37)
Albumin: 3.8 g/dL (ref 3.5–5.2)
Alkaline Phosphatase: 50 U/L (ref 39–117)
BUN: 22 mg/dL (ref 6–23)
CO2: 28 mEq/L (ref 19–32)
Calcium: 8.8 mg/dL (ref 8.4–10.5)
Chloride: 102 mEq/L (ref 96–112)
Creatinine, Ser: 1.7 mg/dL — ABNORMAL HIGH (ref 0.4–1.5)
GFR: 42.36 mL/min — ABNORMAL LOW (ref >60.00)
Glucose, Bld: 94 mg/dL (ref 70–99)
Potassium: 4.3 mEq/L (ref 3.5–5.1)
Sodium: 137 mEq/L (ref 135–145)
Total Bilirubin: 1.2 mg/dL (ref 0.3–1.2)
Total Protein: 6.2 g/dL (ref 6.0–8.3)

## 2013-05-01 LAB — CBC WITH DIFFERENTIAL/PLATELET
Basophils Absolute: 0 10*3/uL (ref 0.0–0.1)
Basophils Relative: 0.3 % (ref 0.0–3.0)
Eosinophils Absolute: 0 10*3/uL (ref 0.0–0.7)
Eosinophils Relative: 0.4 % (ref 0.0–5.0)
HCT: 29.2 % — ABNORMAL LOW (ref 39.0–52.0)
Hemoglobin: 9.7 g/dL — ABNORMAL LOW (ref 13.0–17.0)
Lymphocytes Relative: 15.6 % (ref 12.0–46.0)
Lymphs Abs: 1 10*3/uL (ref 0.7–4.0)
MCHC: 33.3 g/dL (ref 30.0–36.0)
MCV: 94.6 fl (ref 78.0–100.0)
Monocytes Absolute: 0.7 10*3/uL (ref 0.1–1.0)
Monocytes Relative: 10.2 % (ref 3.0–12.0)
Neutro Abs: 4.7 10*3/uL (ref 1.4–7.7)
Neutrophils Relative %: 73.5 % (ref 43.0–77.0)
Platelets: 95 10*3/uL — ABNORMAL LOW (ref 150.0–400.0)
RBC: 3.09 Mil/uL — ABNORMAL LOW (ref 4.22–5.81)
RDW: 16.5 % — ABNORMAL HIGH (ref 11.5–14.6)
WBC: 6.4 10*3/uL (ref 4.5–10.5)

## 2013-05-01 LAB — PROTIME-INR
INR: 3.4 ratio — ABNORMAL HIGH (ref 0.8–1.0)
Prothrombin Time: 35.4 s — ABNORMAL HIGH (ref 10.2–12.4)

## 2013-05-01 NOTE — Telephone Encounter (Signed)
Patient Information:  Caller Name: [facility]  Phone: [phone]  Patient: Rick Gilmore, Rick Gilmore  Gender: Male  DOB: [birth year]  Age: 72 Years  PCP: Deborra Medina (Adults only)  Office Follow Up:  Does the office need to follow up with this patient?: Yes, no appt available.  Instructions For The Office: Per disposition see in office now, pt has appt at 11:45 AM for lab work.  PLEASE F/U WITH PT CONCERNING APPT NEEDS OR TO ADVISE.  THANK YOU.   Symptoms  Reason For Call & Symptoms: Pt states he is feeling very tired and slightly SOB.  Pt reports he did start a new medication Ketoconazole and these side effects are noted. .  Reviewed Health History In EMR: Yes  Reviewed Medications In EMR: Yes  Reviewed Allergies In EMR: Yes  Reviewed Surgeries / Procedures: Yes  Date of Onset of Symptoms: 04/27/2013  Guideline(s) Used:  Breathing Difficulty  Disposition Per Guideline:   Go to Office Now  Reason For Disposition Reached:   Mild difficulty breathing (e.g., minimal/no SOB at rest, SOB with walking, pulse < 100) of new onset or worse than normal  Advice Given:  Call Back If:  Severe difficulty breathing occurs  You become worse.  Patient Will Follow Care Advice:  YES

## 2013-05-01 NOTE — Telephone Encounter (Signed)
Yes, I added them..  Can one of the CMAS check an ambulatory 02 sat on him today ?  If it is < 92 he needs to be seen.

## 2013-05-01 NOTE — Telephone Encounter (Signed)
Spoke with patient, started ketoconazole 04/26/13 for yearly fungal outbreak. Has used numerous times in the past with no symptoms. Pt complaining of increased fatigue since 04/29/13, wants to "sleep all the time". Also complaining of shortness of breath with minimal exertion, walking across his house, starting 2 days ago. Denies any chest pain or other symptoms. Shortness of breath is relieved once he sits down and rests. Pt does not want to stop taking the ketoconazole if possible. Pt has lab appointment today at 11:45 for repeat PT/INR, was elevated on 04/27/13. Any other labwork to add? Pt states taking Coumadin 6 mg Monday, Wednesday, Friday and 4 mg the other days. Advised to hold today's dose, pt had not read his myChart message yet from Charolette Forward, NP.

## 2013-05-01 NOTE — Telephone Encounter (Signed)
Pt arrived to office for bloodwork. Resting, HR 65, pulse ox 96% and BP 104/60. Ambulatory pulse ox 93-96%, HR 93. Pt visibly short of breath, denies chest pain, dizziness. Pt improved after sitting one minute. Advised to rest at home, to call back with worsening symptoms or chest pain. Pt verbalized understanding to hold Coumadin dose tonight and advised we will call with results of blood work.

## 2013-05-01 NOTE — Telephone Encounter (Signed)
Is he weighing himself daily at home?  He has ischemic cardiomyopathy and may nned to taek an additional lasxi dose but only of he notes wt gain overnight. If not,  Has he had a sleep study in the last 2 years?

## 2013-05-01 NOTE — Telephone Encounter (Signed)
Left message for pt, advising of results and Coumadin regimen. Requested call back in the morning to verify he received directions. Also left message regarding a daily weight check and sleep study.

## 2013-05-01 NOTE — Telephone Encounter (Signed)
INR is still going up.  Stop the coumadin until Thursday,  Then resume at 4 mg daily and repeat INR  on Monday

## 2013-05-02 ENCOUNTER — Ambulatory Visit: Payer: Medicare Other

## 2013-05-02 DIAGNOSIS — R0989 Other specified symptoms and signs involving the circulatory and respiratory systems: Secondary | ICD-10-CM

## 2013-05-02 DIAGNOSIS — R0609 Other forms of dyspnea: Secondary | ICD-10-CM | POA: Diagnosis not present

## 2013-05-02 DIAGNOSIS — Z79899 Other long term (current) drug therapy: Secondary | ICD-10-CM

## 2013-05-02 LAB — BRAIN NATRIURETIC PEPTIDE: Brain Natriuretic Peptide: 296.3 pg/mL — ABNORMAL HIGH (ref 0.0–100.0)

## 2013-05-02 NOTE — Telephone Encounter (Signed)
Spoke with Rick Gilmore, appointment scheduled 05/07/13 with D. Tullo to evaluate. Advised to call back if symptoms worsen prior to appointment.

## 2013-05-02 NOTE — Telephone Encounter (Signed)
Spoke with pt, verbalized understanding on Coumadin regimen. Appt scheduled for recheck PT/INR on 05/07/13. Pt does not have a scale at home, does not check daily weights. States he will get one. Denies any lower extremity swelling. States last sleep study 12/17/2009. Continues to have shortness of breath with minimal exertion. I can schedule him an appointment with either you or Raquel to evaluate?

## 2013-05-02 NOTE — Telephone Encounter (Signed)
We should try to put him (and other patients who have seen Raquel for [place]) back in our clinic  For the follow up so they don't feel they have lost continuitiyt with their PCP.  Thanks

## 2013-05-03 DIAGNOSIS — I4891 Unspecified atrial fibrillation: Secondary | ICD-10-CM | POA: Diagnosis not present

## 2013-05-07 ENCOUNTER — Ambulatory Visit (INDEPENDENT_AMBULATORY_CARE_PROVIDER_SITE_OTHER): Payer: Medicare Other | Admitting: Internal Medicine

## 2013-05-07 ENCOUNTER — Encounter: Payer: Self-pay | Admitting: Internal Medicine

## 2013-05-07 ENCOUNTER — Other Ambulatory Visit (INDEPENDENT_AMBULATORY_CARE_PROVIDER_SITE_OTHER): Payer: Medicare Other

## 2013-05-07 VITALS — BP 118/62 | HR 88 | Temp 98.0°F | Resp 14 | Wt 282.5 lb

## 2013-05-07 DIAGNOSIS — D649 Anemia, unspecified: Secondary | ICD-10-CM | POA: Diagnosis not present

## 2013-05-07 DIAGNOSIS — D510 Vitamin B12 deficiency anemia due to intrinsic factor deficiency: Secondary | ICD-10-CM

## 2013-05-07 DIAGNOSIS — R197 Diarrhea, unspecified: Secondary | ICD-10-CM | POA: Insufficient documentation

## 2013-05-07 DIAGNOSIS — Z7901 Long term (current) use of anticoagulants: Secondary | ICD-10-CM | POA: Diagnosis not present

## 2013-05-07 DIAGNOSIS — R0989 Other specified symptoms and signs involving the circulatory and respiratory systems: Secondary | ICD-10-CM

## 2013-05-07 DIAGNOSIS — R0600 Dyspnea, unspecified: Secondary | ICD-10-CM

## 2013-05-07 DIAGNOSIS — G4733 Obstructive sleep apnea (adult) (pediatric): Secondary | ICD-10-CM

## 2013-05-07 DIAGNOSIS — B369 Superficial mycosis, unspecified: Secondary | ICD-10-CM

## 2013-05-07 DIAGNOSIS — R0689 Other abnormalities of breathing: Secondary | ICD-10-CM | POA: Insufficient documentation

## 2013-05-07 DIAGNOSIS — R0609 Other forms of dyspnea: Secondary | ICD-10-CM

## 2013-05-07 LAB — PROTIME-INR
INR: 2.2 ratio — ABNORMAL HIGH (ref 0.8–1.0)
Prothrombin Time: 23.3 s — ABNORMAL HIGH (ref 10.2–12.4)

## 2013-05-07 LAB — FERRITIN: Ferritin: 118.7 ng/mL (ref 22.0–322.0)

## 2013-05-07 MED ORDER — CYANOCOBALAMIN 1000 MCG/ML IJ SOLN
1000.0000 ug | Freq: Once | INTRAMUSCULAR | Status: AC
  Administered 2013-05-07: 1000 ug via INTRAMUSCULAR

## 2013-05-07 NOTE — Assessment & Plan Note (Signed)
Suspend furosemide until loose stools resolve, as he has prerenal azotemia on recent labs due to diarrhea and concurrent use of diuretic

## 2013-05-07 NOTE — Patient Instructions (Addendum)
You have developed an anemia  that may be causing your shortness of breat  We will check some additional labs today to look for causes  Suspend your furosemide  (lasix)  Until the diarrhea resolves,  Then resume at 1/2 tablet dialy for 2 days,

## 2013-05-07 NOTE — Assessment & Plan Note (Signed)
Treated annually with ketoconazole, per patient.  This year it is causing diarrhea and prolonged INR>  Will recommend he stop the ketoconazole

## 2013-05-07 NOTE — Assessment & Plan Note (Signed)
Etiology does not appear to be chf despite EF 25% because his lungs are clear,  Weight is stable and BNP < 500.  I suspect his anemia is a major contributor , as well as his obesity and low EF.

## 2013-05-07 NOTE — Progress Notes (Signed)
Patient ID: Rick Gilmore, male   DOB: [birth year], 72 y.o.   MRN: [id number]   Patient Active Problem List   Diagnosis Date Noted  . Diarrhea 05/07/2013  . Anemia 05/07/2013  . Superficial fungus infection of skin 04/27/2013  . Encounter for long-term (current) use of anticoagulants 11/02/2011  . Pernicious anemia 11/02/2011  . Hx of adenomatous colonic polyps 11/02/2011  . Personal history of prostate cancer   . Ischemic cardiomyopathy   . Sleep apnea, obstructive     Subjective:  CC:   Chief Complaint  Patient presents with  . Acute Visit    Follow up for a fungus    HPI:   Maliek Dundee a 72 y.o. male who presentswith dyspnea with exertion of recent onset.  He denies wheezing but notes increased fatigue and loose stools since he started taking ketonazole 10 days ago for his recurrent fungal infection (skin).    Stools have been brown. Has been having 3 loose stools daily, Recently his  his INR was polonged due to concurrent use of oral  ketoconazole and he developed a large ecchymosis on his abdomen and his warfarin was suspended for 2 days.   Has occasional blood in his mouth when he wakes up in the morning but denies that his gums are bleeding.  He has OSA and is wearing his CPAP at least 8 or 9 hours per night,  Overnight recordings in April suggest AHI  Of 5.6.  Last sleep study was 2011.  CPAP setting is 12 .  No datyime hypersomnolence .    Past Medical History  Diagnosis Date  . CAD (coronary artery disease)   . Atrial fibrillation     on Coumadin  . Hypertension   . Erectile dysfunction   . Hyperlipidemia   . Ischemic cardiomyopathy     s/p AICD/pacer 2009, replaced 2010 [facility]  . Cancer 2001    Prostate, XRT and implant  . Tubular adenoma     colon polyp  . Personal history of prostate cancer 2001    s/p XRT and implant ([facility])  . Sleep apnea, obstructive     uses CPAP nightly    Past Surgical History  Procedure Laterality Date  . Vasectomy    . Coronary  artery bypass graft  1988       The following portions of the patient's history were reviewed and updated as appropriate: Allergies, current medications, and problem list.    Review of Systems:   12 Pt  review of systems was negative except those addressed in the HPI,     History   Social History  . Marital Status: Divorced    Spouse Name: [name]    Number of Children: [name]  . Years of Education: [name]   Occupational History  . Not on file.   Social History Main Topics  . Smoking status: Never Smoker   . Smokeless tobacco: Never Used  . Alcohol Use: 1.0 oz/week    2 drink(s) per week  . Drug Use: No  . Sexually Active: Not on file   Other Topics Concern  . Not on file   Social History Narrative  . No narrative on file    Objective:  BP 118/62  Pulse 88  Temp(Src) 98 F (36.7 C) (Oral)  Resp 14  Wt 282 lb 8 oz (128.141 kg)  BMI 35.31 kg/m2  SpO2 98%  General appearance: alert, cooperative and appears stated age Ears: normal TM's and external ear canals both ears  Throat: lips, mucosa, and tongue normal; teeth and gums normal Neck: no adenopathy, no carotid bruit, supple, symmetrical, trachea midline and thyroid not enlarged, symmetric, no tenderness/mass/nodules Back: symmetric, no curvature. ROM normal. No CVA tenderness. Lungs: clear to auscultation bilaterally Heart: regular rate and rhythm, S1, S2 normal, no murmur, click, rub or gallop Abdomen: soft, non-tender; bowel sounds normal; no masses,  no organomegaly Pulses: 2+ and symmetric Skin: Skin color, texture, turgor normal. No rashes or lesions Lymph nodes: Cervical, supraclavicular, and axillary nodes normal.  Assessment and Plan:  Anemia New onset  hgb is 9.7 which is down from 12.2 in 2012.   Started 10 days ago with the ketoconazole, this has never happened before.  History of b12 deficiency,  But checking iron studies,  retic ct,  FOBT  and epo.   B12 shot given today  Diarrhea Suspend  furosemide until loose stools resolve, as he has prerenal azotemia on recent labs due to diarrhea and concurrent use of diuretic   Superficial fungus infection of skin Treated annually with ketoconazole, per patient.  This year it is causing diarrhea and prolonged INR>  Will recommend he stop the ketoconazole   Sleep apnea, obstructive Will consider an overnight autotitration once he is back from beach.   Long term (current) use of anticoagulants Repeat INR due today ,  After suspension of coumadin for two days.   Dyspnea and respiratory abnormality Etiology does not appear to be chf despite EF 25% because his lungs are clear,  Weight is stable and BNP < 500.  I suspect his anemia is a major contributor , as well as his obesity and low EF.     A total of 40 minutes was spent with patient more than half of which was spent in counseling, reviewing records from other prviders and coordination of care Updated Medication List Outpatient Encounter Prescriptions as of 05/07/2013  Medication Sig Dispense Refill  . allopurinol (ZYLOPRIM) 300 MG tablet Take 300 mg by mouth daily.      Marland Kitchen aspirin 81 MG tablet Take 81 mg by mouth daily.        . colchicine 0.6 MG tablet Take 0.6 mg by mouth as needed.      . cyanocobalamin (,VITAMIN B-12,) 1000 MCG/ML injection Inject 1,000 mcg into the muscle every 30 (thirty) days.      Marland Kitchen doxazosin (CARDURA) 4 MG tablet Take 1 tablet (4 mg total) by mouth at bedtime.  90 tablet  3  . furosemide (LASIX) 80 MG tablet TAKE 1 TABLET BY MOUTH DAILY  90 tablet  3  . ketoconazole (NIZORAL) 200 MG tablet Take 1 tablet (200 mg total) by mouth daily.  28 tablet  2  . lisinopril (PRINIVIL,ZESTRIL) 10 MG tablet TAKE 1/2 TABLET (5MG ) BY MOUTH 2 TIMES DAILY  90 tablet  1  . metoprolol succinate (TOPROL-XL) 100 MG 24 hr tablet TAKE 1 TABLET BY MOUTH DAILY  90 tablet  3  . NEXIUM 40 MG capsule TAKE 1 CAPSULE BY MOUTH DAILY BEFORE BREAKFAST  90 capsule  3  . simvastatin (ZOCOR) 20  MG tablet TAKE 1 TABLET BY MOUTH AT BEDTIME  90 tablet  3  . warfarin (COUMADIN) 4 MG tablet TAKE 6MG  (1 AND 1/2 TABLETS) ON WEDNESDAY AND SATURDAY AND 4 MG(1 TABLET) ON ALL OTHER DAYS  90 tablet  2  . warfarin (COUMADIN) 6 MG tablet       . [EXPIRED] cyanocobalamin ((VITAMIN B-12)) injection 1,000 mcg  No facility-administered encounter medications on file as of 05/07/2013.

## 2013-05-07 NOTE — Assessment & Plan Note (Signed)
Repeat INR due today ,  After suspension of coumadin for two days.

## 2013-05-07 NOTE — Assessment & Plan Note (Signed)
Will consider an overnight autotitration once he is back from beach.

## 2013-05-07 NOTE — Assessment & Plan Note (Addendum)
New onset  hgb is 9.7 which is down from 12.2 in 2012.   Started 10 days ago with the ketoconazole, this has never happened before.  History of b12 deficiency,  But checking iron studies,  retic ct,  FOBT  and epo.   B12 shot given today

## 2013-05-08 DIAGNOSIS — D510 Vitamin B12 deficiency anemia due to intrinsic factor deficiency: Secondary | ICD-10-CM | POA: Diagnosis not present

## 2013-05-08 LAB — RETICULOCYTES
ABS Retic: 53.9 10*3/uL (ref 19.0–186.0)
RBC.: 3.17 MIL/uL — ABNORMAL LOW (ref 4.22–5.81)
Retic Ct Pct: 1.7 % (ref 0.4–2.3)

## 2013-05-08 LAB — ERYTHROPOIETIN: Erythropoietin: 23.9 m[IU]/mL — ABNORMAL HIGH (ref 2.6–18.5)

## 2013-05-08 LAB — IRON AND TIBC
%SAT: 15 % — ABNORMAL LOW (ref 20–55)
Iron: 52 ug/dL (ref 42–165)
TIBC: 341 ug/dL (ref 215–435)
UIBC: 289 ug/dL (ref 125–400)

## 2013-05-08 NOTE — Addendum Note (Signed)
Addended by: Marchia Bond on: 05/08/2013 08:33 AM   Modules accepted: Orders

## 2013-05-08 NOTE — Addendum Note (Signed)
Addended by: Marchia Bond on: 05/08/2013 08:28 AM   Modules accepted: Orders

## 2013-05-09 ENCOUNTER — Other Ambulatory Visit: Payer: Medicare Other

## 2013-05-10 ENCOUNTER — Other Ambulatory Visit: Payer: Medicare Other

## 2013-05-10 ENCOUNTER — Encounter: Payer: Self-pay | Admitting: Internal Medicine

## 2013-05-11 ENCOUNTER — Ambulatory Visit (INDEPENDENT_AMBULATORY_CARE_PROVIDER_SITE_OTHER): Payer: Medicare Other | Admitting: *Deleted

## 2013-05-11 ENCOUNTER — Encounter: Payer: Self-pay | Admitting: Internal Medicine

## 2013-05-11 ENCOUNTER — Other Ambulatory Visit: Payer: Self-pay | Admitting: *Deleted

## 2013-05-11 DIAGNOSIS — D649 Anemia, unspecified: Secondary | ICD-10-CM

## 2013-05-11 DIAGNOSIS — Z1211 Encounter for screening for malignant neoplasm of colon: Secondary | ICD-10-CM

## 2013-05-11 DIAGNOSIS — D696 Thrombocytopenia, unspecified: Secondary | ICD-10-CM

## 2013-05-11 LAB — FECAL OCCULT BLOOD, IMMUNOCHEMICAL: Fecal Occult Bld: NEGATIVE

## 2013-05-12 ENCOUNTER — Encounter: Payer: Self-pay | Admitting: Internal Medicine

## 2013-05-14 ENCOUNTER — Telehealth: Payer: Self-pay

## 2013-05-14 NOTE — Telephone Encounter (Signed)
My Chart Message: Your fecal occult blood test was normal . I noticed that your platelets were also slightly low as well. I would like you to see Dr. Grayland Ormond, A hematologist when you get back to continue the workup on your anemia. I will have Amber call you when you get back/    Left message for patient to call office back or to check his my chart message.

## 2013-05-15 ENCOUNTER — Telehealth: Payer: Self-pay | Admitting: Internal Medicine

## 2013-05-15 NOTE — Telephone Encounter (Signed)
Pt aware of the referral in process, he cannot check his mychart from the beach. He is ok with the referral to Grayland Ormond but will not be in town until Monday, he will call me then.

## 2013-05-25 ENCOUNTER — Encounter: Payer: Self-pay | Admitting: Internal Medicine

## 2013-05-29 ENCOUNTER — Ambulatory Visit: Payer: Self-pay | Admitting: Oncology

## 2013-05-29 DIAGNOSIS — D696 Thrombocytopenia, unspecified: Secondary | ICD-10-CM | POA: Diagnosis not present

## 2013-05-29 DIAGNOSIS — I2589 Other forms of chronic ischemic heart disease: Secondary | ICD-10-CM | POA: Diagnosis not present

## 2013-05-29 DIAGNOSIS — I10 Essential (primary) hypertension: Secondary | ICD-10-CM | POA: Diagnosis not present

## 2013-05-29 DIAGNOSIS — R0602 Shortness of breath: Secondary | ICD-10-CM | POA: Diagnosis not present

## 2013-05-29 DIAGNOSIS — G4733 Obstructive sleep apnea (adult) (pediatric): Secondary | ICD-10-CM | POA: Diagnosis not present

## 2013-05-29 DIAGNOSIS — I2510 Atherosclerotic heart disease of native coronary artery without angina pectoris: Secondary | ICD-10-CM | POA: Diagnosis not present

## 2013-05-29 DIAGNOSIS — Z951 Presence of aortocoronary bypass graft: Secondary | ICD-10-CM | POA: Diagnosis not present

## 2013-05-29 DIAGNOSIS — D509 Iron deficiency anemia, unspecified: Secondary | ICD-10-CM | POA: Diagnosis not present

## 2013-05-29 DIAGNOSIS — Z923 Personal history of irradiation: Secondary | ICD-10-CM | POA: Diagnosis not present

## 2013-05-29 DIAGNOSIS — Z79899 Other long term (current) drug therapy: Secondary | ICD-10-CM | POA: Diagnosis not present

## 2013-05-29 DIAGNOSIS — I4891 Unspecified atrial fibrillation: Secondary | ICD-10-CM | POA: Diagnosis not present

## 2013-05-29 DIAGNOSIS — E538 Deficiency of other specified B group vitamins: Secondary | ICD-10-CM | POA: Diagnosis not present

## 2013-05-29 DIAGNOSIS — Z859 Personal history of malignant neoplasm, unspecified: Secondary | ICD-10-CM | POA: Diagnosis not present

## 2013-05-29 LAB — LACTATE DEHYDROGENASE: LDH: 206 U/L (ref 85–241)

## 2013-05-29 LAB — CBC CANCER CENTER
Basophil #: 0.1 x10 3/mm (ref 0.0–0.1)
Basophil %: 0.8 %
Eosinophil #: 0.1 x10 3/mm (ref 0.0–0.7)
Eosinophil %: 1 %
HCT: 31.6 % — ABNORMAL LOW (ref 40.0–52.0)
HGB: 10.7 g/dL — ABNORMAL LOW (ref 13.0–18.0)
Lymphocyte #: 1.4 x10 3/mm (ref 1.0–3.6)
Lymphocyte %: 21.2 %
MCH: 32.3 pg (ref 26.0–34.0)
MCHC: 34 g/dL (ref 32.0–36.0)
MCV: 95 fL (ref 80–100)
Monocyte #: 0.6 x10 3/mm (ref 0.2–1.0)
Monocyte %: 9.7 %
Neutrophil #: 4.4 x10 3/mm (ref 1.4–6.5)
Neutrophil %: 67.3 %
Platelet: 124 x10 3/mm — ABNORMAL LOW (ref 150–440)
RBC: 3.33 10*6/uL — ABNORMAL LOW (ref 4.40–5.90)
RDW: 16.3 % — ABNORMAL HIGH (ref 11.5–14.5)
WBC: 6.6 x10 3/mm (ref 3.8–10.6)

## 2013-05-29 LAB — IRON AND TIBC
Iron Bind.Cap.(Total): 393 ug/dL (ref 250–450)
Iron Saturation: 21 %
Iron: 81 ug/dL (ref 65–175)
Unbound Iron-Bind.Cap.: 312 ug/dL

## 2013-05-29 LAB — FERRITIN: Ferritin (ARMC): 90 ng/mL (ref 8–388)

## 2013-05-30 ENCOUNTER — Other Ambulatory Visit: Payer: Self-pay | Admitting: Internal Medicine

## 2013-05-30 LAB — PROT IMMUNOELECTROPHORES(ARMC)

## 2013-06-05 ENCOUNTER — Encounter: Payer: Self-pay | Admitting: Adult Health

## 2013-06-05 ENCOUNTER — Ambulatory Visit: Payer: Self-pay | Admitting: Oncology

## 2013-06-05 ENCOUNTER — Ambulatory Visit (INDEPENDENT_AMBULATORY_CARE_PROVIDER_SITE_OTHER): Payer: Medicare Other | Admitting: Adult Health

## 2013-06-05 ENCOUNTER — Ambulatory Visit (INDEPENDENT_AMBULATORY_CARE_PROVIDER_SITE_OTHER)
Admission: RE | Admit: 2013-06-05 | Discharge: 2013-06-05 | Disposition: A | Discharge status: Home / Self Care | Payer: Medicare Other | Source: Ambulatory Visit | Attending: Internal Medicine | Admitting: Internal Medicine

## 2013-06-05 ENCOUNTER — Telehealth: Payer: Self-pay | Admitting: Internal Medicine

## 2013-06-05 ENCOUNTER — Other Ambulatory Visit: Payer: Self-pay | Admitting: *Deleted

## 2013-06-05 VITALS — BP 122/60 | HR 83 | Resp 12 | Wt 297.5 lb

## 2013-06-05 DIAGNOSIS — M25559 Pain in unspecified hip: Secondary | ICD-10-CM | POA: Diagnosis not present

## 2013-06-05 DIAGNOSIS — S79919A Unspecified injury of unspecified hip, initial encounter: Secondary | ICD-10-CM | POA: Diagnosis not present

## 2013-06-05 DIAGNOSIS — S7012XA Contusion of left thigh, initial encounter: Secondary | ICD-10-CM

## 2013-06-05 DIAGNOSIS — D696 Thrombocytopenia, unspecified: Secondary | ICD-10-CM | POA: Diagnosis not present

## 2013-06-05 DIAGNOSIS — S7010XA Contusion of unspecified thigh, initial encounter: Secondary | ICD-10-CM

## 2013-06-05 DIAGNOSIS — R1030 Lower abdominal pain, unspecified: Secondary | ICD-10-CM

## 2013-06-05 DIAGNOSIS — S79929A Unspecified injury of unspecified thigh, initial encounter: Secondary | ICD-10-CM | POA: Diagnosis not present

## 2013-06-05 DIAGNOSIS — D509 Iron deficiency anemia, unspecified: Secondary | ICD-10-CM | POA: Diagnosis not present

## 2013-06-05 DIAGNOSIS — R109 Unspecified abdominal pain: Secondary | ICD-10-CM | POA: Diagnosis not present

## 2013-06-05 LAB — CBC WITH DIFFERENTIAL/PLATELET
Basophils Absolute: 0 10*3/uL (ref 0.0–0.1)
Basophils Relative: 0 % (ref 0–1)
Eosinophils Absolute: 0 10*3/uL (ref 0.0–0.7)
Eosinophils Relative: 0 % (ref 0–5)
HCT: 25.1 % — ABNORMAL LOW (ref 39.0–52.0)
Hemoglobin: 8.6 g/dL — ABNORMAL LOW (ref 13.0–17.0)
Lymphocytes Relative: 19 % (ref 12–46)
Lymphs Abs: 1.9 10*3/uL (ref 0.7–4.0)
MCH: 31.3 pg (ref 26.0–34.0)
MCHC: 34.3 g/dL (ref 30.0–36.0)
MCV: 91.3 fL (ref 78.0–100.0)
Monocytes Absolute: 0.8 10*3/uL (ref 0.1–1.0)
Monocytes Relative: 8 % (ref 3–12)
Neutro Abs: 7.1 10*3/uL (ref 1.7–7.7)
Neutrophils Relative %: 73 % (ref 43–77)
Platelets: 123 10*3/uL — ABNORMAL LOW (ref 150–400)
RBC: 2.75 MIL/uL — ABNORMAL LOW (ref 4.22–5.81)
RDW: 16.5 % — ABNORMAL HIGH (ref 11.5–15.5)
WBC: 9.8 10*3/uL (ref 4.0–10.5)

## 2013-06-05 LAB — PROTIME-INR
INR: 2.51 — ABNORMAL HIGH (ref <1.50)
Prothrombin Time: 26.1 seconds — ABNORMAL HIGH (ref 11.6–15.2)

## 2013-06-05 NOTE — Assessment & Plan Note (Addendum)
Possibility that patient is still bleeding into the area. Check cbc, PT/INR. Will stop coumadin for ~ 2 days. Await results for further instruction. RTC for f/u on Thursday.

## 2013-06-05 NOTE — Telephone Encounter (Signed)
Tried calling patient no answer left message to call office.

## 2013-06-05 NOTE — Telephone Encounter (Signed)
Patient fell at the pool needing to be seen he thinks he pulled a muscle in his groin. He will see N.P Rey at 4:00. Unless Dr. Derrel Nip wants to work him in.

## 2013-06-05 NOTE — Telephone Encounter (Signed)
Please advise if needs to be seen sooner will see Raquel Rey NP at 4 PM

## 2013-06-05 NOTE — Patient Instructions (Addendum)
  Do not take coumadin today or tomorrow.  Return for follow up on Thursday.  Please have lab work done prior to leaving the office.

## 2013-06-05 NOTE — Telephone Encounter (Signed)
Patient returned call and is going to [facility] for X-ray of left groin prior to visit.

## 2013-06-05 NOTE — Telephone Encounter (Signed)
i have no work in [employer]

## 2013-06-05 NOTE — Progress Notes (Signed)
  Subjective:    Patient ID: Rick Gilmore, male    DOB: [birth year], 72 y.o.   MRN: [id number]  HPI  Patient is a very pleasant 72 year old male with atrial fibrillation currently on coumadin therapy presents to clinic s/p fall on Sunday. He reports pulling his left inner thigh muscle when falling. He did not hit anything. Shortly after he noted a very large hematoma on the left thigh. He reports noticing this hematoma worsening.  Current Outpatient Prescriptions on File Prior to Visit  Medication Sig Dispense Refill  . allopurinol (ZYLOPRIM) 300 MG tablet Take 300 mg by mouth daily.      Marland Kitchen aspirin 81 MG tablet Take 81 mg by mouth daily.        . colchicine 0.6 MG tablet Take 0.6 mg by mouth as needed.      . cyanocobalamin (,VITAMIN B-12,) 1000 MCG/ML injection Inject 1,000 mcg into the muscle every 30 (thirty) days.      Marland Kitchen doxazosin (CARDURA) 4 MG tablet TAKE 1 TABLET AT BEDTIME  90 tablet  1  . furosemide (LASIX) 80 MG tablet TAKE 1 TABLET BY MOUTH DAILY  90 tablet  3  . lisinopril (PRINIVIL,ZESTRIL) 10 MG tablet TAKE 1/2 TABLET (5MG ) BY MOUTH 2 TIMES DAILY  90 tablet  1  . metoprolol succinate (TOPROL-XL) 100 MG 24 hr tablet TAKE 1 TABLET BY MOUTH DAILY  90 tablet  3  . NEXIUM 40 MG capsule TAKE 1 CAPSULE BY MOUTH DAILY BEFORE BREAKFAST  90 capsule  3  . simvastatin (ZOCOR) 20 MG tablet TAKE 1 TABLET BY MOUTH AT BEDTIME  90 tablet  3  . warfarin (COUMADIN) 4 MG tablet TAKE 6MG  (1 AND 1/2 TABLETS) ON WEDNESDAY AND SATURDAY AND 4 MG(1 TABLET) ON ALL OTHER DAYS  90 tablet  2  . warfarin (COUMADIN) 6 MG tablet        No current facility-administered medications on file prior to visit.    Review of Systems  Constitutional: Negative.   Respiratory: Negative.   Cardiovascular: Negative.   Gastrointestinal: Negative.   Skin:       Very large bruise on the upper left thigh  Neurological: Negative.   Hematological:       On long term coumadin therapy       Objective:   Physical Exam   Constitutional: He is oriented to person, place, and time.  Overweight male in no apparent distress  Cardiovascular: Normal rate and regular rhythm.   Pulmonary/Chest: Effort normal. No respiratory distress.  Musculoskeletal:  Left upper inner thigh with large hematoma from mid thigh to groin  Neurological: He is alert and oriented to person, place, and time.  Skin: Skin is warm and dry.  Large hematoma on left thigh s/p fall. Area is hard to touch, skin taut  Psychiatric: He has a normal mood and affect. His behavior is normal. Judgment and thought content normal.          Assessment & Plan:

## 2013-06-05 NOTE — Telephone Encounter (Signed)
See prior message,  No arluer appts available.  But Have him get a hip x ray at [facility] prior to his appt with Raquel.  Find out which hip is botherind  him and order it,  thank

## 2013-06-07 ENCOUNTER — Ambulatory Visit (INDEPENDENT_AMBULATORY_CARE_PROVIDER_SITE_OTHER): Payer: Medicare Other | Admitting: Adult Health

## 2013-06-07 VITALS — BP 106/58 | HR 85 | Resp 12 | Wt 297.5 lb

## 2013-06-07 DIAGNOSIS — S7010XA Contusion of unspecified thigh, initial encounter: Secondary | ICD-10-CM | POA: Diagnosis not present

## 2013-06-07 DIAGNOSIS — S7012XA Contusion of left thigh, initial encounter: Secondary | ICD-10-CM

## 2013-06-07 DIAGNOSIS — T148XXA Other injury of unspecified body region, initial encounter: Secondary | ICD-10-CM

## 2013-06-07 LAB — CBC WITH DIFFERENTIAL/PLATELET
Basophils Absolute: 0 10*3/uL (ref 0.0–0.1)
Basophils Relative: 0.4 % (ref 0.0–3.0)
Eosinophils Absolute: 0.1 10*3/uL (ref 0.0–0.7)
Eosinophils Relative: 0.7 % (ref 0.0–5.0)
HCT: 26.1 % — ABNORMAL LOW (ref 39.0–52.0)
Hemoglobin: 8.6 g/dL — ABNORMAL LOW (ref 13.0–17.0)
Lymphocytes Relative: 17.4 % (ref 12.0–46.0)
Lymphs Abs: 1.3 10*3/uL (ref 0.7–4.0)
MCHC: 32.9 g/dL (ref 30.0–36.0)
MCV: 98.8 fl (ref 78.0–100.0)
Monocytes Absolute: 0.7 10*3/uL (ref 0.1–1.0)
Monocytes Relative: 9.1 % (ref 3.0–12.0)
Neutro Abs: 5.5 10*3/uL (ref 1.4–7.7)
Neutrophils Relative %: 72.4 % (ref 43.0–77.0)
Platelets: 123 10*3/uL — ABNORMAL LOW (ref 150.0–400.0)
RBC: 2.64 Mil/uL — ABNORMAL LOW (ref 4.22–5.81)
RDW: 16.8 % — ABNORMAL HIGH (ref 11.5–14.6)
WBC: 7.5 10*3/uL (ref 4.5–10.5)

## 2013-06-07 LAB — PROTIME-INR
INR: 2.2 ratio — ABNORMAL HIGH (ref 0.8–1.0)
Prothrombin Time: 22.9 s — ABNORMAL HIGH (ref 10.2–12.4)

## 2013-06-07 NOTE — Assessment & Plan Note (Signed)
Area appears to be improving. There is less tightness. Repeat cbc and PT/INR. Patient has been off of his coumadin x 2 days. Will resume at half dose today (2mg ) and resume normal dose tomorrow of 4 mg unless otherwise instructed.

## 2013-06-07 NOTE — Progress Notes (Signed)
  Subjective:    Patient ID: Rick Gilmore, male    DOB: 26-Dec-1940, 72 y.o.   MRN: [id number]  HPI  Patient presents for followup of large hematoma in the left upper thigh. He has stopped his Coumadin x2 days. Recent hemoglobin had dropped to 1 point. He is not feeling any significant shortness of breath or fatigue other than his usual. Hematoma is not larger. Some tightness.   Current Outpatient Prescriptions on File Prior to Visit  Medication Sig Dispense Refill  . allopurinol (ZYLOPRIM) 300 MG tablet Take 300 mg by mouth daily.      Marland Kitchen aspirin 81 MG tablet Take 81 mg by mouth daily.        . colchicine 0.6 MG tablet Take 0.6 mg by mouth as needed.      . cyanocobalamin (,VITAMIN B-12,) 1000 MCG/ML injection Inject 1,000 mcg into the muscle every 30 (thirty) days.      Marland Kitchen doxazosin (CARDURA) 4 MG tablet TAKE 1 TABLET AT BEDTIME  90 tablet  1  . furosemide (LASIX) 80 MG tablet TAKE 1 TABLET BY MOUTH DAILY  90 tablet  3  . lisinopril (PRINIVIL,ZESTRIL) 10 MG tablet TAKE 1/2 TABLET (5MG ) BY MOUTH 2 TIMES DAILY  90 tablet  1  . metoprolol succinate (TOPROL-XL) 100 MG 24 hr tablet TAKE 1 TABLET BY MOUTH DAILY  90 tablet  3  . NEXIUM 40 MG capsule TAKE 1 CAPSULE BY MOUTH DAILY BEFORE BREAKFAST  90 capsule  3  . simvastatin (ZOCOR) 20 MG tablet TAKE 1 TABLET BY MOUTH AT BEDTIME  90 tablet  3  . warfarin (COUMADIN) 4 MG tablet TAKE 6MG  (1 AND 1/2 TABLETS) ON WEDNESDAY AND SATURDAY AND 4 MG(1 TABLET) ON ALL OTHER DAYS  90 tablet  2  . warfarin (COUMADIN) 6 MG tablet        No current facility-administered medications on file prior to visit.      Review of Systems  Constitutional: Negative.   Respiratory: Positive for shortness of breath.        Usual shortness of breath with exertion. No increase in sob  Cardiovascular: Negative.   Skin:       Large bruise in left upper thigh  Psychiatric/Behavioral: Negative.        Objective:   Physical Exam  Constitutional: He is oriented to  person, place, and time. He appears well-developed and well-nourished. No distress.  Cardiovascular: Normal rate and regular rhythm.   Pulmonary/Chest: Effort normal and breath sounds normal.  Neurological: He is alert and oriented to person, place, and time.  Skin:  Area of left upper thigh is improved. Less taut.   Psychiatric: He has a normal mood and affect. His behavior is normal. Judgment and thought content normal.          Assessment & Plan:  ppppp

## 2013-06-11 ENCOUNTER — Ambulatory Visit (INDEPENDENT_AMBULATORY_CARE_PROVIDER_SITE_OTHER): Payer: Medicare Other | Admitting: Adult Health

## 2013-06-11 ENCOUNTER — Encounter: Payer: Self-pay | Admitting: Adult Health

## 2013-06-11 VITALS — BP 108/56 | HR 81 | Resp 12 | Wt 292.0 lb

## 2013-06-11 DIAGNOSIS — T148XXA Other injury of unspecified body region, initial encounter: Secondary | ICD-10-CM | POA: Diagnosis not present

## 2013-06-11 DIAGNOSIS — S7010XA Contusion of unspecified thigh, initial encounter: Secondary | ICD-10-CM | POA: Diagnosis not present

## 2013-06-11 DIAGNOSIS — S7012XA Contusion of left thigh, initial encounter: Secondary | ICD-10-CM

## 2013-06-11 NOTE — Progress Notes (Signed)
  Subjective:    Patient ID: Rick Gilmore, male    DOB: [birth year], 72 y.o.   MRN: [id number]  HPI  Patient presents to clinic for followup large hematoma left upper thigh. He is also here for repeat blood work of CBC and PT/INR. Bruising now more evident on the posterior aspect of left upper extremity as well as upper portion of the lower extremity. Denies fatigue or shortness of breath.   Current Outpatient Prescriptions on File Prior to Visit  Medication Sig Dispense Refill  . allopurinol (ZYLOPRIM) 300 MG tablet Take 300 mg by mouth daily.      Marland Kitchen aspirin 81 MG tablet Take 81 mg by mouth daily.        . colchicine 0.6 MG tablet Take 0.6 mg by mouth as needed.      . cyanocobalamin (,VITAMIN B-12,) 1000 MCG/ML injection Inject 1,000 mcg into the muscle every 30 (thirty) days.      Marland Kitchen doxazosin (CARDURA) 4 MG tablet TAKE 1 TABLET AT BEDTIME  90 tablet  1  . furosemide (LASIX) 80 MG tablet TAKE 1 TABLET BY MOUTH DAILY  90 tablet  3  . lisinopril (PRINIVIL,ZESTRIL) 10 MG tablet TAKE 1/2 TABLET (5MG ) BY MOUTH 2 TIMES DAILY  90 tablet  1  . metoprolol succinate (TOPROL-XL) 100 MG 24 hr tablet TAKE 1 TABLET BY MOUTH DAILY  90 tablet  3  . NEXIUM 40 MG capsule TAKE 1 CAPSULE BY MOUTH DAILY BEFORE BREAKFAST  90 capsule  3  . simvastatin (ZOCOR) 20 MG tablet TAKE 1 TABLET BY MOUTH AT BEDTIME  90 tablet  3  . warfarin (COUMADIN) 4 MG tablet TAKE 6MG  (1 AND 1/2 TABLETS) ON WEDNESDAY AND SATURDAY AND 4 MG(1 TABLET) ON ALL OTHER DAYS  90 tablet  2  . warfarin (COUMADIN) 6 MG tablet        No current facility-administered medications on file prior to visit.    Review of Systems  Skin:       Bruising left upper extremity and now also posterior aspect of thigh and upper portion of lower extremity   BP 108/56  Pulse 81  Resp 12  Wt 292 lb (132.45 kg)  BMI 36.5 kg/m2  SpO2 97%    Objective:   Physical Exam  Constitutional: He is oriented to person, place, and time. No distress.   Cardiovascular: Normal rate.   Pulmonary/Chest: No respiratory distress.  Neurological: He is alert and oriented to person, place, and time.  Skin: Skin is warm and dry.  Large hematoma left upper extremity which appears to be resolving. There is now ecchymosis in the posterior aspect of the thigh and upper portion of lower extremity.  Psychiatric: He has a normal mood and affect. His behavior is normal. Judgment and thought content normal.      Assessment & Plan:

## 2013-06-11 NOTE — Assessment & Plan Note (Signed)
Hematoma improving. Now there is also ecchymosis in the posterior aspect of the thigh and running down to upper portion of lower extremity. Recheck CBC and PT/INR. I have asked patient to resume normal dose of Coumadin unless otherwise advised. Note, patient wanted Dr. Grayland Ormond notified about incident. I called his office and left a message with his nurse.

## 2013-06-12 LAB — CBC WITH DIFFERENTIAL/PLATELET
Basophils Absolute: 0 10*3/uL (ref 0.0–0.1)
Basophils Relative: 0.3 % (ref 0.0–3.0)
Eosinophils Absolute: 0.1 10*3/uL (ref 0.0–0.7)
Eosinophils Relative: 1.3 % (ref 0.0–5.0)
HCT: 26.7 % — ABNORMAL LOW (ref 39.0–52.0)
Hemoglobin: 9.1 g/dL — ABNORMAL LOW (ref 13.0–17.0)
Lymphocytes Relative: 16.7 % (ref 12.0–46.0)
Lymphs Abs: 1.5 10*3/uL (ref 0.7–4.0)
MCHC: 33.9 g/dL (ref 30.0–36.0)
MCV: 96.9 fl (ref 78.0–100.0)
Monocytes Absolute: 0.5 10*3/uL (ref 0.1–1.0)
Monocytes Relative: 6 % (ref 3.0–12.0)
Neutro Abs: 6.9 10*3/uL (ref 1.4–7.7)
Neutrophils Relative %: 75.7 % (ref 43.0–77.0)
Platelets: 163 10*3/uL (ref 150.0–400.0)
RBC: 2.76 Mil/uL — ABNORMAL LOW (ref 4.22–5.81)
RDW: 17.4 % — ABNORMAL HIGH (ref 11.5–14.6)
WBC: 9.1 10*3/uL (ref 4.5–10.5)

## 2013-06-12 LAB — PROTIME-INR
INR: 1.9 ratio — ABNORMAL HIGH (ref 0.8–1.0)
Prothrombin Time: 19.5 s — ABNORMAL HIGH (ref 10.2–12.4)

## 2013-07-06 ENCOUNTER — Ambulatory Visit: Payer: Self-pay | Admitting: Oncology

## 2013-07-11 ENCOUNTER — Other Ambulatory Visit: Payer: Self-pay

## 2013-07-13 ENCOUNTER — Other Ambulatory Visit (INDEPENDENT_AMBULATORY_CARE_PROVIDER_SITE_OTHER): Payer: Medicare Other

## 2013-07-13 ENCOUNTER — Ambulatory Visit: Payer: Medicare Other | Admitting: *Deleted

## 2013-07-13 ENCOUNTER — Telehealth: Payer: Self-pay | Admitting: *Deleted

## 2013-07-13 ENCOUNTER — Ambulatory Visit: Payer: Self-pay | Admitting: Internal Medicine

## 2013-07-13 DIAGNOSIS — D649 Anemia, unspecified: Secondary | ICD-10-CM

## 2013-07-13 DIAGNOSIS — Z23 Encounter for immunization: Secondary | ICD-10-CM

## 2013-07-13 DIAGNOSIS — Z7901 Long term (current) use of anticoagulants: Secondary | ICD-10-CM

## 2013-07-13 LAB — CBC WITH DIFFERENTIAL/PLATELET
Basophils Absolute: 0.1 10*3/uL (ref 0.0–0.1)
Basophils Relative: 0.5 % (ref 0.0–3.0)
Eosinophils Absolute: 0.5 10*3/uL (ref 0.0–0.7)
Eosinophils Relative: 4.3 % (ref 0.0–5.0)
HCT: 36.3 % — ABNORMAL LOW (ref 39.0–52.0)
Hemoglobin: 11.9 g/dL — ABNORMAL LOW (ref 13.0–17.0)
Lymphocytes Relative: 33.3 % (ref 12.0–46.0)
Lymphs Abs: 3.7 10*3/uL (ref 0.7–4.0)
MCHC: 32.6 g/dL (ref 30.0–36.0)
MCV: 92.3 fl (ref 78.0–100.0)
Monocytes Absolute: 0.9 10*3/uL (ref 0.1–1.0)
Monocytes Relative: 7.9 % (ref 3.0–12.0)
Neutro Abs: 6 10*3/uL (ref 1.4–7.7)
Neutrophils Relative %: 54 % (ref 43.0–77.0)
Platelets: 263 10*3/uL (ref 150.0–400.0)
RBC: 3.93 Mil/uL — ABNORMAL LOW (ref 4.22–5.81)
RDW: 14.1 % (ref 11.5–14.6)
WBC: 11.2 10*3/uL — ABNORMAL HIGH (ref 4.5–10.5)

## 2013-07-13 LAB — PROTIME-INR
INR: 3.4 ratio — ABNORMAL HIGH (ref 0.8–1.0)
Prothrombin Time: 35 s — ABNORMAL HIGH (ref 10.2–12.4)

## 2013-07-13 MED ORDER — CYANOCOBALAMIN 1000 MCG/ML IJ SOLN
1000.0000 ug | Freq: Once | INTRAMUSCULAR | Status: AC
  Administered 2013-07-13: 1000 ug via INTRAMUSCULAR

## 2013-07-13 NOTE — Telephone Encounter (Signed)
What labs and dx?  

## 2013-07-14 ENCOUNTER — Telehealth: Payer: Self-pay | Admitting: Internal Medicine

## 2013-07-14 ENCOUNTER — Encounter: Payer: Self-pay | Admitting: Internal Medicine

## 2013-07-14 NOTE — Telephone Encounter (Signed)
Please confirm on Monday and Rick Gilmore received a message to decrease his Coumadin dose to 4 mg all days but one day of  6 mg repeat PT/INR in one month.

## 2013-07-14 NOTE — Assessment & Plan Note (Signed)
INR 3.4. Reduce dose from 30 mg weekly to 30 mg weekly using 4 mg x 6 days and 6 mg x 1 day  regimen

## 2013-07-16 NOTE — Telephone Encounter (Signed)
Patient confirmed receiving message and voiced understanding of dosage change.

## 2013-08-10 ENCOUNTER — Other Ambulatory Visit: Payer: Self-pay | Admitting: Internal Medicine

## 2013-08-10 NOTE — Telephone Encounter (Signed)
Eprescribed.

## 2013-08-21 ENCOUNTER — Other Ambulatory Visit (INDEPENDENT_AMBULATORY_CARE_PROVIDER_SITE_OTHER): Payer: Medicare Other

## 2013-08-21 ENCOUNTER — Encounter: Payer: Self-pay | Admitting: Internal Medicine

## 2013-08-21 ENCOUNTER — Ambulatory Visit (INDEPENDENT_AMBULATORY_CARE_PROVIDER_SITE_OTHER): Payer: Medicare Other | Admitting: *Deleted

## 2013-08-21 DIAGNOSIS — E538 Deficiency of other specified B group vitamins: Secondary | ICD-10-CM | POA: Diagnosis not present

## 2013-08-21 DIAGNOSIS — Z7901 Long term (current) use of anticoagulants: Secondary | ICD-10-CM

## 2013-08-21 DIAGNOSIS — D649 Anemia, unspecified: Secondary | ICD-10-CM

## 2013-08-21 LAB — PROTIME-INR
INR: 2 ratio — ABNORMAL HIGH (ref 0.8–1.0)
Prothrombin Time: 20.6 s — ABNORMAL HIGH (ref 10.2–12.4)

## 2013-08-21 MED ORDER — CYANOCOBALAMIN 1000 MCG/ML IJ SOLN
1000.0000 ug | Freq: Once | INTRAMUSCULAR | Status: AC
  Administered 2013-08-21: 1000 ug via INTRAMUSCULAR

## 2013-08-23 NOTE — Telephone Encounter (Signed)
Called and advised patient of results. Appt for PT/INR scheduled 09/05/13

## 2013-09-04 ENCOUNTER — Other Ambulatory Visit: Payer: Self-pay | Admitting: Internal Medicine

## 2013-09-04 DIAGNOSIS — I4891 Unspecified atrial fibrillation: Secondary | ICD-10-CM | POA: Diagnosis not present

## 2013-09-05 ENCOUNTER — Other Ambulatory Visit (INDEPENDENT_AMBULATORY_CARE_PROVIDER_SITE_OTHER): Payer: Medicare Other

## 2013-09-05 ENCOUNTER — Ambulatory Visit (INDEPENDENT_AMBULATORY_CARE_PROVIDER_SITE_OTHER): Payer: Medicare Other | Admitting: *Deleted

## 2013-09-05 DIAGNOSIS — Z7901 Long term (current) use of anticoagulants: Secondary | ICD-10-CM | POA: Diagnosis not present

## 2013-09-05 DIAGNOSIS — Z23 Encounter for immunization: Secondary | ICD-10-CM

## 2013-09-05 LAB — PROTIME-INR
INR: 1.9 ratio — ABNORMAL HIGH (ref 0.8–1.0)
Prothrombin Time: 19.8 s — ABNORMAL HIGH (ref 10.2–12.4)

## 2013-09-06 ENCOUNTER — Encounter: Payer: Self-pay | Admitting: Internal Medicine

## 2013-09-25 ENCOUNTER — Other Ambulatory Visit (INDEPENDENT_AMBULATORY_CARE_PROVIDER_SITE_OTHER): Payer: Medicare Other

## 2013-09-25 ENCOUNTER — Ambulatory Visit (INDEPENDENT_AMBULATORY_CARE_PROVIDER_SITE_OTHER): Payer: Medicare Other | Admitting: *Deleted

## 2013-09-25 DIAGNOSIS — Z7901 Long term (current) use of anticoagulants: Secondary | ICD-10-CM

## 2013-09-25 DIAGNOSIS — E538 Deficiency of other specified B group vitamins: Secondary | ICD-10-CM | POA: Diagnosis not present

## 2013-09-25 LAB — PROTIME-INR
INR: 3.1 ratio — ABNORMAL HIGH (ref 0.8–1.0)
Prothrombin Time: 32.2 s — ABNORMAL HIGH (ref 10.2–12.4)

## 2013-09-25 MED ORDER — CYANOCOBALAMIN 1000 MCG/ML IJ SOLN
1000.0000 ug | Freq: Once | INTRAMUSCULAR | Status: AC
  Administered 2013-09-25: 1000 ug via INTRAMUSCULAR

## 2013-09-26 ENCOUNTER — Encounter: Payer: Self-pay | Admitting: Internal Medicine

## 2013-10-02 DIAGNOSIS — H0019 Chalazion unspecified eye, unspecified eyelid: Secondary | ICD-10-CM | POA: Diagnosis not present

## 2013-10-03 DIAGNOSIS — I5022 Chronic systolic (congestive) heart failure: Secondary | ICD-10-CM | POA: Diagnosis not present

## 2013-10-03 DIAGNOSIS — G4730 Sleep apnea, unspecified: Secondary | ICD-10-CM | POA: Diagnosis not present

## 2013-10-03 DIAGNOSIS — E782 Mixed hyperlipidemia: Secondary | ICD-10-CM | POA: Diagnosis not present

## 2013-10-03 DIAGNOSIS — I059 Rheumatic mitral valve disease, unspecified: Secondary | ICD-10-CM | POA: Diagnosis not present

## 2013-10-13 ENCOUNTER — Other Ambulatory Visit: Payer: Self-pay | Admitting: Internal Medicine

## 2013-10-21 ENCOUNTER — Other Ambulatory Visit: Payer: Self-pay | Admitting: Internal Medicine

## 2013-10-23 ENCOUNTER — Ambulatory Visit (INDEPENDENT_AMBULATORY_CARE_PROVIDER_SITE_OTHER): Payer: Medicare Other | Admitting: *Deleted

## 2013-10-23 ENCOUNTER — Other Ambulatory Visit (INDEPENDENT_AMBULATORY_CARE_PROVIDER_SITE_OTHER): Payer: Medicare Other

## 2013-10-23 DIAGNOSIS — E538 Deficiency of other specified B group vitamins: Secondary | ICD-10-CM | POA: Diagnosis not present

## 2013-10-23 DIAGNOSIS — Z7901 Long term (current) use of anticoagulants: Secondary | ICD-10-CM | POA: Diagnosis not present

## 2013-10-23 DIAGNOSIS — H0019 Chalazion unspecified eye, unspecified eyelid: Secondary | ICD-10-CM | POA: Diagnosis not present

## 2013-10-23 LAB — PROTIME-INR
INR: 2.9 ratio — ABNORMAL HIGH (ref 0.8–1.0)
Prothrombin Time: 29.6 s — ABNORMAL HIGH (ref 10.2–12.4)

## 2013-10-23 MED ORDER — CYANOCOBALAMIN 1000 MCG/ML IJ SOLN
1000.0000 ug | Freq: Once | INTRAMUSCULAR | Status: AC
  Administered 2013-10-23: 1000 ug via INTRAMUSCULAR

## 2013-10-24 ENCOUNTER — Encounter: Payer: Self-pay | Admitting: Internal Medicine

## 2013-11-26 ENCOUNTER — Telehealth: Payer: Self-pay | Admitting: *Deleted

## 2013-11-26 DIAGNOSIS — D649 Anemia, unspecified: Secondary | ICD-10-CM

## 2013-11-26 DIAGNOSIS — I25810 Atherosclerosis of coronary artery bypass graft(s) without angina pectoris: Secondary | ICD-10-CM

## 2013-11-26 DIAGNOSIS — Z79899 Other long term (current) drug therapy: Secondary | ICD-10-CM

## 2013-11-26 DIAGNOSIS — Z7901 Long term (current) use of anticoagulants: Secondary | ICD-10-CM

## 2013-11-26 NOTE — Telephone Encounter (Signed)
Pt is coming in tomorrow what labs and dx?  

## 2013-11-27 ENCOUNTER — Ambulatory Visit (INDEPENDENT_AMBULATORY_CARE_PROVIDER_SITE_OTHER): Payer: Medicare Other | Admitting: *Deleted

## 2013-11-27 ENCOUNTER — Other Ambulatory Visit (INDEPENDENT_AMBULATORY_CARE_PROVIDER_SITE_OTHER): Payer: Medicare Other

## 2013-11-27 DIAGNOSIS — D649 Anemia, unspecified: Secondary | ICD-10-CM

## 2013-11-27 DIAGNOSIS — E538 Deficiency of other specified B group vitamins: Secondary | ICD-10-CM | POA: Diagnosis not present

## 2013-11-27 DIAGNOSIS — Z79899 Other long term (current) drug therapy: Secondary | ICD-10-CM

## 2013-11-27 DIAGNOSIS — Z7901 Long term (current) use of anticoagulants: Secondary | ICD-10-CM

## 2013-11-27 LAB — COMPREHENSIVE METABOLIC PANEL
ALT: 13 U/L (ref 0–53)
AST: 20 U/L (ref 0–37)
Albumin: 4.6 g/dL (ref 3.5–5.2)
Alkaline Phosphatase: 45 U/L (ref 39–117)
BUN: 38 mg/dL — ABNORMAL HIGH (ref 6–23)
CO2: 27 mEq/L (ref 19–32)
Calcium: 9.5 mg/dL (ref 8.4–10.5)
Chloride: 102 mEq/L (ref 96–112)
Creatinine, Ser: 1.7 mg/dL — ABNORMAL HIGH (ref 0.4–1.5)
GFR: 41.73 mL/min — ABNORMAL LOW (ref >60.00)
Glucose, Bld: 94 mg/dL (ref 70–99)
Potassium: 5.6 mEq/L — ABNORMAL HIGH (ref 3.5–5.1)
Sodium: 136 mEq/L (ref 135–145)
Total Bilirubin: 0.6 mg/dL (ref 0.3–1.2)
Total Protein: 7.1 g/dL (ref 6.0–8.3)

## 2013-11-27 LAB — FERRITIN: Ferritin: 250.3 ng/mL (ref 22.0–322.0)

## 2013-11-27 LAB — IRON AND TIBC
%SAT: 24 % (ref 20–55)
Iron: 81 ug/dL (ref 42–165)
TIBC: 340 ug/dL (ref 215–435)
UIBC: 259 ug/dL (ref 125–400)

## 2013-11-27 LAB — PROTIME-INR
INR: 2.5 ratio — ABNORMAL HIGH (ref 0.8–1.0)
Prothrombin Time: 25.5 s — ABNORMAL HIGH (ref 10.2–12.4)

## 2013-11-27 MED ORDER — CYANOCOBALAMIN 1000 MCG/ML IJ SOLN
1000.0000 ug | Freq: Once | INTRAMUSCULAR | Status: AC
  Administered 2013-11-27: 1000 ug via INTRAMUSCULAR

## 2013-11-27 NOTE — Telephone Encounter (Signed)
He has not had a fasting lipid panel in a while, but if he has not fasted this morning, please omit from the orders. thanks

## 2013-11-28 DIAGNOSIS — S61209A Unspecified open wound of unspecified finger without damage to nail, initial encounter: Secondary | ICD-10-CM | POA: Diagnosis not present

## 2013-11-28 DIAGNOSIS — S6980XA Other specified injuries of unspecified wrist, hand and finger(s), initial encounter: Secondary | ICD-10-CM | POA: Diagnosis not present

## 2013-12-02 ENCOUNTER — Encounter: Payer: Self-pay | Admitting: Internal Medicine

## 2013-12-03 ENCOUNTER — Encounter: Payer: Self-pay | Admitting: Adult Health

## 2013-12-04 ENCOUNTER — Ambulatory Visit (INDEPENDENT_AMBULATORY_CARE_PROVIDER_SITE_OTHER): Payer: Medicare Other | Admitting: Adult Health

## 2013-12-04 ENCOUNTER — Other Ambulatory Visit (INDEPENDENT_AMBULATORY_CARE_PROVIDER_SITE_OTHER): Payer: Medicare Other

## 2013-12-04 ENCOUNTER — Encounter: Payer: Self-pay | Admitting: Internal Medicine

## 2013-12-04 ENCOUNTER — Other Ambulatory Visit: Payer: Self-pay | Admitting: *Deleted

## 2013-12-04 DIAGNOSIS — IMO0002 Reserved for concepts with insufficient information to code with codable children: Secondary | ICD-10-CM

## 2013-12-04 DIAGNOSIS — R799 Abnormal finding of blood chemistry, unspecified: Secondary | ICD-10-CM | POA: Diagnosis not present

## 2013-12-04 DIAGNOSIS — S61019A Laceration without foreign body of unspecified thumb without damage to nail, initial encounter: Secondary | ICD-10-CM | POA: Insufficient documentation

## 2013-12-04 DIAGNOSIS — S61011S Laceration without foreign body of right thumb without damage to nail, sequela: Secondary | ICD-10-CM

## 2013-12-04 LAB — POTASSIUM: Potassium: 5.1 mEq/L (ref 3.5–5.1)

## 2013-12-04 MED ORDER — CEPHALEXIN 500 MG PO CAPS: 500.0000 mg | ORAL_CAPSULE | Freq: Three times a day (TID) | ORAL | Status: DC

## 2013-12-04 NOTE — Patient Instructions (Signed)
  Start Keflex 500 mg three times a day for 7 days.  Also take a probiotic such as TEFL teacher.  Return to office on Monday for suture removal.

## 2013-12-04 NOTE — Assessment & Plan Note (Addendum)
Stitches to right thumb. He experienced laceration while opening a can. Patient was seen in urgent care on 11/28/2013. Start Keflex 500 mg 3 times a day empirically. Return to clinic on Monday for evaluation and possible removal of stitches. Dressing reapplied

## 2013-12-04 NOTE — Progress Notes (Signed)
   Subjective:    Patient ID: Rick Gilmore, male    DOB: 1940-12-25, 72 y.o.   MRN: [id number]  HPI  Patient presents to clinic for labs. He reports that he experienced a laceration of his right thumb while opening up a 10. He was seen in urgent care on 11/28/2013 and head area sutured. He reports tenderness to the area. It is slightly red; however, does not appear to have signs of infection. The area is macerated from being covered and moist for extended periods of time. Denies fever or chills. Denies drainage from site.  Review of Systems Positive for laceration and sutures to right thumb Negative for pain, swelling, drainage    Objective:   Physical Exam Sutures in place. No s/s of infection of right thumb. The area is warm to touch and slightly erythematous. No drainage noted. Skin appears well approximated.       Assessment & Plan:

## 2013-12-10 ENCOUNTER — Encounter: Payer: Self-pay | Admitting: Adult Health

## 2013-12-10 ENCOUNTER — Ambulatory Visit (INDEPENDENT_AMBULATORY_CARE_PROVIDER_SITE_OTHER): Payer: Medicare Other | Admitting: Adult Health

## 2013-12-10 VITALS — BP 106/60 | HR 73 | Temp 98.2°F | Resp 12 | Ht 75.0 in | Wt 287.5 lb

## 2013-12-10 DIAGNOSIS — Z5189 Encounter for other specified aftercare: Secondary | ICD-10-CM | POA: Diagnosis not present

## 2013-12-10 DIAGNOSIS — S61011D Laceration without foreign body of right thumb without damage to nail, subsequent encounter: Secondary | ICD-10-CM

## 2013-12-10 NOTE — Progress Notes (Signed)
   Subjective:    Patient ID: Rick Gilmore, male    DOB: 10-20-1941, 73 y.o.   MRN: [id number]  HPI  Removal of stitches to right thumb  Review of Systems Skin: right thumb positive for bruising. Tightness of skin at site of laceration. Negative for swelling, redness or drainage.    Objective:   Physical Exam  Skin is well approximated. Right thumb with 3 stitches in place. No s/s infection. Healing nicely.  BP 106/60  Pulse 73  Temp(Src) 98.2 F (36.8 C) (Oral)  Resp 12  Ht 6\' 3"  (1.905 m)  Wt 287 lb 8 oz (130.409 kg)  BMI 35.93 kg/m2  SpO2 97%       Assessment & Plan:

## 2013-12-10 NOTE — Progress Notes (Signed)
Pre visit review using our clinic review tool, if applicable. No additional management support is needed unless otherwise documented below in the visit note. 

## 2013-12-10 NOTE — Assessment & Plan Note (Signed)
Removed 3 stitches from right dorsal thumb after cleaning area with betadine. Pt tolerated well. Skin well approximated. Left open to air. RTC if any problems or concerns with area.

## 2013-12-19 ENCOUNTER — Other Ambulatory Visit: Payer: Self-pay | Admitting: Internal Medicine

## 2013-12-27 ENCOUNTER — Encounter: Payer: Self-pay | Admitting: Adult Health

## 2013-12-27 ENCOUNTER — Ambulatory Visit (INDEPENDENT_AMBULATORY_CARE_PROVIDER_SITE_OTHER): Payer: Medicare Other | Admitting: Adult Health

## 2013-12-27 VITALS — BP 106/60 | HR 74 | Temp 98.8°F | Resp 12 | Wt 284.0 lb

## 2013-12-27 DIAGNOSIS — J209 Acute bronchitis, unspecified: Secondary | ICD-10-CM | POA: Diagnosis not present

## 2013-12-27 MED ORDER — AMOXICILLIN-POT CLAVULANATE 875-125 MG PO TABS: 1.0000 | ORAL_TABLET | Freq: Two times a day (BID) | ORAL | Status: DC

## 2013-12-27 MED ORDER — DEXTROMETHORPHAN POLISTIREX 30 MG/5ML PO LQCR: 15.0000 mg | ORAL | Status: DC | PRN

## 2013-12-27 MED ORDER — GUAIFENESIN-CODEINE 100-10 MG/5ML PO SOLN: 5.0000 mL | Freq: Three times a day (TID) | ORAL | Status: DC | PRN

## 2013-12-27 NOTE — Progress Notes (Signed)
   Subjective:    Patient ID: Rick Gilmore, male    DOB: [birth year], 73 y.o.   MRN: [id number]  HPI  Nonproductive cough x 2 days, runny nose , fatigue. He is always short of breath and now just a little more. No wheezing.  Current Outpatient Prescriptions on File Prior to Visit  Medication Sig Dispense Refill  . allopurinol (ZYLOPRIM) 300 MG tablet Take 300 mg by mouth daily.      Marland Kitchen aspirin 81 MG tablet Take 81 mg by mouth daily.        . colchicine 0.6 MG tablet Take 0.6 mg by mouth as needed.      . cyanocobalamin (,VITAMIN B-12,) 1000 MCG/ML injection Inject 1,000 mcg into the muscle every 30 (thirty) days.      Marland Kitchen doxazosin (CARDURA) 4 MG tablet TAKE 1 TABLET AT BEDTIME  90 tablet  1  . furosemide (LASIX) 80 MG tablet TAKE 1 TABLET DAILY  90 tablet  1  . lisinopril (PRINIVIL,ZESTRIL) 10 MG tablet TAKE ONE-HALF (1/2) TABLET TWICE A DAY  90 tablet  1  . metoprolol succinate (TOPROL-XL) 100 MG 24 hr tablet TAKE 1 TABLET DAILY  90 tablet  1  . NEXIUM 40 MG capsule TAKE 1 CAPSULE DAILY BEFORE BREAKFAST  90 capsule  3  . simvastatin (ZOCOR) 20 MG tablet TAKE 1 TABLET AT BEDTIME  90 tablet  1  . warfarin (COUMADIN) 4 MG tablet TAKE ONE AND ONE-HALF TABLETS (6 MG) ON WEDNESDAY AND SATURDAY AND 1 TABLET ON ALL OTHER DAYS  90 tablet  2   No current facility-administered medications on file prior to visit.     Review of Systems  Constitutional: Positive for fatigue.  HENT: Positive for postnasal drip and rhinorrhea. Negative for congestion, ear discharge, ear pain, sinus pressure and sore throat.   Respiratory: Positive for cough and shortness of breath. Negative for chest tightness and wheezing.   Cardiovascular: Negative.   Gastrointestinal: Negative.   Neurological: Negative.   Psychiatric/Behavioral: Negative.        Objective:   Physical Exam  Constitutional: He is oriented to person, place, and time.  Acutely ill  HENT:  Head: Normocephalic and atraumatic.  Mouth/Throat:  Oropharynx is clear and moist.  Cardiovascular: Normal rate and regular rhythm.  Exam reveals no gallop.   No murmur heard. Pulmonary/Chest: Effort normal and breath sounds normal. No respiratory distress. He has no wheezes. He has no rales.  Coughing throughout visit. Shortness of breath with exertion. Breath sounds clear with good air movement  Musculoskeletal: Normal range of motion.  Neurological: He is oriented to person, place, and time.  Skin: Skin is warm and dry.  Psychiatric: He has a normal mood and affect. His behavior is normal. Judgment and thought content normal.   BP 106/60  Pulse 74  Temp(Src) 98.8 F (37.1 C) (Oral)  Resp 12  Wt 284 lb (128.822 kg)  SpO2 98%        Assessment & Plan:

## 2013-12-27 NOTE — Assessment & Plan Note (Signed)
Acute bronchitis and URI. Comorbidities. Start Robitussin AC and Augmentin. See pt instructions for further POC

## 2013-12-27 NOTE — Progress Notes (Signed)
Pre visit review using our clinic review tool, if applicable. No additional management support is needed unless otherwise documented below in the visit note. 

## 2013-12-27 NOTE — Patient Instructions (Signed)
  Start Augmentin twice a day for 10 days.  Robitussin AC for severe cough. Medication contains codeine and may cause sedation. Do not drive while taking this medication.  If you want to take an over-the-counter cough medication you can try Delsym with guaifenesin and dextromethorphan.  If severe nasal stuffiness should occur, you may use Afrin nasal spray every 12 hours as needed only for 3 days.  For general discomfort you may take Tylenol Cold and Sinus. Avoid products containing ibuprofen since you are on coumadin.

## 2013-12-28 ENCOUNTER — Encounter: Payer: Self-pay | Admitting: Adult Health

## 2014-01-01 ENCOUNTER — Ambulatory Visit (INDEPENDENT_AMBULATORY_CARE_PROVIDER_SITE_OTHER): Payer: Medicare Other | Admitting: *Deleted

## 2014-01-01 ENCOUNTER — Other Ambulatory Visit (INDEPENDENT_AMBULATORY_CARE_PROVIDER_SITE_OTHER): Payer: Medicare Other

## 2014-01-01 ENCOUNTER — Other Ambulatory Visit: Payer: Self-pay | Admitting: Internal Medicine

## 2014-01-01 DIAGNOSIS — I25810 Atherosclerosis of coronary artery bypass graft(s) without angina pectoris: Secondary | ICD-10-CM

## 2014-01-01 DIAGNOSIS — E538 Deficiency of other specified B group vitamins: Secondary | ICD-10-CM

## 2014-01-01 LAB — LIPID PANEL
Cholesterol: 88 mg/dL (ref 0–200)
HDL: 23.3 mg/dL — ABNORMAL LOW (ref >39.00)
LDL Cholesterol: 38 mg/dL (ref 0–99)
Total CHOL/HDL Ratio: 4
Triglycerides: 136 mg/dL (ref 0.0–149.0)
VLDL: 27.2 mg/dL (ref 0.0–40.0)

## 2014-01-01 MED ORDER — CYANOCOBALAMIN 1000 MCG/ML IJ SOLN
1000.0000 ug | Freq: Once | INTRAMUSCULAR | Status: AC
  Administered 2014-01-01: 1000 ug via INTRAMUSCULAR

## 2014-01-03 ENCOUNTER — Other Ambulatory Visit: Payer: Self-pay | Admitting: Internal Medicine

## 2014-01-03 ENCOUNTER — Encounter: Payer: Self-pay | Admitting: Internal Medicine

## 2014-01-03 MED ORDER — SIMVASTATIN 10 MG PO TABS: ORAL_TABLET | ORAL | Status: DC

## 2014-01-03 MED ORDER — WARFARIN SODIUM 4 MG PO TABS: ORAL_TABLET | ORAL | Status: DC

## 2014-01-03 NOTE — Addendum Note (Signed)
Addended by: Crecencio Mc on: 01/03/2014 12:31 PM   Modules accepted: Orders

## 2014-01-07 NOTE — Telephone Encounter (Signed)
Mailed unread message to pt  

## 2014-01-27 ENCOUNTER — Other Ambulatory Visit: Payer: Self-pay | Admitting: Internal Medicine

## 2014-02-05 ENCOUNTER — Ambulatory Visit (INDEPENDENT_AMBULATORY_CARE_PROVIDER_SITE_OTHER): Payer: Medicare Other | Admitting: *Deleted

## 2014-02-05 ENCOUNTER — Other Ambulatory Visit (INDEPENDENT_AMBULATORY_CARE_PROVIDER_SITE_OTHER): Payer: Medicare Other

## 2014-02-05 DIAGNOSIS — D519 Vitamin B12 deficiency anemia, unspecified: Secondary | ICD-10-CM

## 2014-02-05 DIAGNOSIS — D518 Other vitamin B12 deficiency anemias: Secondary | ICD-10-CM

## 2014-02-05 DIAGNOSIS — Z7901 Long term (current) use of anticoagulants: Secondary | ICD-10-CM

## 2014-02-05 LAB — PROTIME-INR
INR: 2.6 ratio — ABNORMAL HIGH (ref 0.8–1.0)
Prothrombin Time: 27.2 s — ABNORMAL HIGH (ref 10.2–12.4)

## 2014-02-05 MED ORDER — CYANOCOBALAMIN 1000 MCG/ML IJ SOLN
1000.0000 ug | Freq: Once | INTRAMUSCULAR | Status: AC
  Administered 2014-02-05: 1000 ug via INTRAMUSCULAR

## 2014-02-06 ENCOUNTER — Encounter: Payer: Self-pay | Admitting: Internal Medicine

## 2014-02-14 DIAGNOSIS — I4891 Unspecified atrial fibrillation: Secondary | ICD-10-CM | POA: Diagnosis not present

## 2014-02-14 DIAGNOSIS — Z95810 Presence of automatic (implantable) cardiac defibrillator: Secondary | ICD-10-CM | POA: Diagnosis not present

## 2014-02-14 DIAGNOSIS — Z7901 Long term (current) use of anticoagulants: Secondary | ICD-10-CM | POA: Diagnosis not present

## 2014-02-14 DIAGNOSIS — G4730 Sleep apnea, unspecified: Secondary | ICD-10-CM | POA: Diagnosis not present

## 2014-03-11 ENCOUNTER — Telehealth: Payer: Self-pay | Admitting: *Deleted

## 2014-03-11 DIAGNOSIS — Z7901 Long term (current) use of anticoagulants: Secondary | ICD-10-CM

## 2014-03-11 DIAGNOSIS — Z79899 Other long term (current) drug therapy: Secondary | ICD-10-CM

## 2014-03-11 NOTE — Telephone Encounter (Signed)
Pt is coming in tomorrow what labs and dx?  

## 2014-03-12 ENCOUNTER — Encounter: Payer: Self-pay | Admitting: Internal Medicine

## 2014-03-12 ENCOUNTER — Other Ambulatory Visit (INDEPENDENT_AMBULATORY_CARE_PROVIDER_SITE_OTHER): Payer: Medicare Other

## 2014-03-12 ENCOUNTER — Ambulatory Visit (INDEPENDENT_AMBULATORY_CARE_PROVIDER_SITE_OTHER): Payer: Medicare Other | Admitting: *Deleted

## 2014-03-12 DIAGNOSIS — Z7901 Long term (current) use of anticoagulants: Secondary | ICD-10-CM | POA: Diagnosis not present

## 2014-03-12 DIAGNOSIS — Z79899 Other long term (current) drug therapy: Secondary | ICD-10-CM

## 2014-03-12 DIAGNOSIS — E538 Deficiency of other specified B group vitamins: Secondary | ICD-10-CM | POA: Diagnosis not present

## 2014-03-12 LAB — COMPREHENSIVE METABOLIC PANEL
ALT: 11 U/L (ref 0–53)
AST: 20 U/L (ref 0–37)
Albumin: 4.3 g/dL (ref 3.5–5.2)
Alkaline Phosphatase: 44 U/L (ref 39–117)
BUN: 33 mg/dL — ABNORMAL HIGH (ref 6–23)
CO2: 24 mEq/L (ref 19–32)
Calcium: 9.2 mg/dL (ref 8.4–10.5)
Chloride: 105 mEq/L (ref 96–112)
Creatinine, Ser: 1.3 mg/dL (ref 0.4–1.5)
GFR: 58.11 mL/min — ABNORMAL LOW (ref >60.00)
Glucose, Bld: 100 mg/dL — ABNORMAL HIGH (ref 70–99)
Potassium: 5.1 mEq/L (ref 3.5–5.1)
Sodium: 136 mEq/L (ref 135–145)
Total Bilirubin: 0.9 mg/dL (ref 0.3–1.2)
Total Protein: 7.1 g/dL (ref 6.0–8.3)

## 2014-03-12 LAB — PROTIME-INR
INR: 2.7 ratio — ABNORMAL HIGH (ref 0.8–1.0)
Prothrombin Time: 28.1 s — ABNORMAL HIGH (ref 10.2–12.4)

## 2014-03-12 MED ORDER — CYANOCOBALAMIN 1000 MCG/ML IJ SOLN
1000.0000 ug | Freq: Once | INTRAMUSCULAR | Status: AC
  Administered 2014-03-12: 1000 ug via INTRAMUSCULAR

## 2014-03-12 NOTE — Telephone Encounter (Signed)
Can you set it him up for a monthly INR for a year? He also needs  CMET,  I will enter both for today

## 2014-03-12 NOTE — Progress Notes (Signed)
Patient came in with large discolored ecchymosis to left arm measuring 7.5 cm X 9 CM Patient could not recall any injury advised Dr. Derrel Nip due to patient on coumadin therapy patient having INR checked today. Advised patient if area increased to call office .

## 2014-03-15 ENCOUNTER — Other Ambulatory Visit: Payer: Self-pay | Admitting: Internal Medicine

## 2014-04-10 ENCOUNTER — Other Ambulatory Visit: Payer: Self-pay | Admitting: Internal Medicine

## 2014-04-11 ENCOUNTER — Other Ambulatory Visit: Payer: Self-pay | Admitting: *Deleted

## 2014-04-11 MED ORDER — METOPROLOL SUCCINATE ER 100 MG PO TB24
ORAL_TABLET | ORAL | Status: DC
Start: 2014-04-11 — End: 2014-12-19

## 2014-04-11 NOTE — Telephone Encounter (Signed)
Sent mychart message regarding need for appointment. Requested call to office to schedule

## 2014-04-18 ENCOUNTER — Other Ambulatory Visit: Payer: Self-pay | Admitting: *Deleted

## 2014-04-18 ENCOUNTER — Telehealth: Payer: Self-pay | Admitting: Internal Medicine

## 2014-04-18 NOTE — Telephone Encounter (Signed)
Pt wants to inform that he will call back to set up f/u appt when meds are low.

## 2014-04-19 MED ORDER — SIMVASTATIN 10 MG PO TABS: ORAL_TABLET | ORAL | Status: DC

## 2014-04-19 NOTE — Telephone Encounter (Signed)
FYI

## 2014-04-23 ENCOUNTER — Ambulatory Visit (INDEPENDENT_AMBULATORY_CARE_PROVIDER_SITE_OTHER): Payer: Medicare Other | Admitting: *Deleted

## 2014-04-23 ENCOUNTER — Other Ambulatory Visit (INDEPENDENT_AMBULATORY_CARE_PROVIDER_SITE_OTHER): Payer: Medicare Other

## 2014-04-23 DIAGNOSIS — E538 Deficiency of other specified B group vitamins: Secondary | ICD-10-CM

## 2014-04-23 DIAGNOSIS — Z7901 Long term (current) use of anticoagulants: Secondary | ICD-10-CM | POA: Diagnosis not present

## 2014-04-23 LAB — PROTIME-INR
INR: 2.6 ratio — ABNORMAL HIGH (ref 0.8–1.0)
Prothrombin Time: 28.3 s — ABNORMAL HIGH (ref 9.6–13.1)

## 2014-04-23 MED ORDER — CYANOCOBALAMIN 1000 MCG/ML IJ SOLN
1000.0000 ug | Freq: Once | INTRAMUSCULAR | Status: AC
  Administered 2014-04-23: 1000 ug via INTRAMUSCULAR

## 2014-04-24 ENCOUNTER — Encounter: Payer: Self-pay | Admitting: Internal Medicine

## 2014-04-24 ENCOUNTER — Other Ambulatory Visit: Payer: Self-pay | Admitting: *Deleted

## 2014-04-24 MED ORDER — ALLOPURINOL 300 MG PO TABS: 300.0000 mg | ORAL_TABLET | Freq: Every day | ORAL | Status: DC

## 2014-04-24 NOTE — Telephone Encounter (Signed)
[facility] sent fax requesting allopurinol 300 mg tabs #90 with 3 refills. Approved by dr Paulla Dolly.

## 2014-05-25 ENCOUNTER — Other Ambulatory Visit: Payer: Self-pay | Admitting: Internal Medicine

## 2014-05-27 ENCOUNTER — Telehealth: Payer: Self-pay | Admitting: *Deleted

## 2014-05-27 DIAGNOSIS — Z7901 Long term (current) use of anticoagulants: Secondary | ICD-10-CM

## 2014-05-27 NOTE — Telephone Encounter (Signed)
Pt is coming in tomorrow what labs and dx?  

## 2014-05-27 NOTE — Telephone Encounter (Signed)
He needs a PT/INR.,  Can you set it up as a monthly repeat?  thanks

## 2014-05-28 ENCOUNTER — Ambulatory Visit (INDEPENDENT_AMBULATORY_CARE_PROVIDER_SITE_OTHER): Payer: Medicare Other | Admitting: *Deleted

## 2014-05-28 ENCOUNTER — Other Ambulatory Visit (INDEPENDENT_AMBULATORY_CARE_PROVIDER_SITE_OTHER): Payer: Medicare Other

## 2014-05-28 DIAGNOSIS — E538 Deficiency of other specified B group vitamins: Secondary | ICD-10-CM

## 2014-05-28 DIAGNOSIS — Z7901 Long term (current) use of anticoagulants: Secondary | ICD-10-CM

## 2014-05-28 LAB — PROTIME-INR
INR: 2.8 ratio — ABNORMAL HIGH (ref 0.8–1.0)
Prothrombin Time: 29.9 s — ABNORMAL HIGH (ref 9.6–13.1)

## 2014-05-28 MED ORDER — CYANOCOBALAMIN 1000 MCG/ML IJ SOLN
1000.0000 ug | Freq: Once | INTRAMUSCULAR | Status: AC
Start: 2014-05-28 — End: 2014-05-28
  Administered 2014-05-28: 1000 ug via INTRAMUSCULAR

## 2014-05-29 ENCOUNTER — Encounter: Payer: Self-pay | Admitting: Internal Medicine

## 2014-06-03 ENCOUNTER — Telehealth: Payer: Self-pay | Admitting: Internal Medicine

## 2014-06-03 DIAGNOSIS — S91009A Unspecified open wound, unspecified ankle, initial encounter: Secondary | ICD-10-CM | POA: Diagnosis not present

## 2014-06-03 DIAGNOSIS — IMO0002 Reserved for concepts with insufficient information to code with codable children: Secondary | ICD-10-CM | POA: Diagnosis not present

## 2014-06-03 DIAGNOSIS — T1490XA Injury, unspecified, initial encounter: Secondary | ICD-10-CM | POA: Diagnosis not present

## 2014-06-03 DIAGNOSIS — S81009A Unspecified open wound, unspecified knee, initial encounter: Secondary | ICD-10-CM | POA: Diagnosis not present

## 2014-06-03 DIAGNOSIS — S99919A Unspecified injury of unspecified ankle, initial encounter: Secondary | ICD-10-CM | POA: Diagnosis not present

## 2014-06-03 DIAGNOSIS — S81809A Unspecified open wound, unspecified lower leg, initial encounter: Secondary | ICD-10-CM | POA: Diagnosis not present

## 2014-06-03 DIAGNOSIS — S8990XA Unspecified injury of unspecified lower leg, initial encounter: Secondary | ICD-10-CM | POA: Diagnosis not present

## 2014-06-03 NOTE — Telephone Encounter (Signed)
Pt states he is on a cruise in [place] and has fallen.  States he needs to know what his coumadin level was last week.  He is going to the doctor this afternoon and needs to know the level.

## 2014-06-03 NOTE — Telephone Encounter (Signed)
Left detailed message on VM of 6.23.15 PT/INR results.

## 2014-06-12 ENCOUNTER — Encounter: Payer: Self-pay | Admitting: Internal Medicine

## 2014-06-12 ENCOUNTER — Inpatient Hospital Stay: Payer: Self-pay | Admitting: Internal Medicine

## 2014-06-12 ENCOUNTER — Telehealth: Payer: Self-pay | Admitting: Internal Medicine

## 2014-06-12 ENCOUNTER — Ambulatory Visit (INDEPENDENT_AMBULATORY_CARE_PROVIDER_SITE_OTHER): Payer: Medicare Other | Admitting: Internal Medicine

## 2014-06-12 VITALS — BP 114/48 | HR 70 | Temp 97.9°F | Resp 18 | Ht 75.0 in | Wt 283.0 lb

## 2014-06-12 DIAGNOSIS — IMO0002 Reserved for concepts with insufficient information to code with codable children: Secondary | ICD-10-CM | POA: Diagnosis not present

## 2014-06-12 DIAGNOSIS — N170 Acute kidney failure with tubular necrosis: Secondary | ICD-10-CM | POA: Diagnosis present

## 2014-06-12 DIAGNOSIS — Z8546 Personal history of malignant neoplasm of prostate: Secondary | ICD-10-CM | POA: Diagnosis not present

## 2014-06-12 DIAGNOSIS — G4733 Obstructive sleep apnea (adult) (pediatric): Secondary | ICD-10-CM | POA: Diagnosis present

## 2014-06-12 DIAGNOSIS — N189 Chronic kidney disease, unspecified: Secondary | ICD-10-CM | POA: Diagnosis present

## 2014-06-12 DIAGNOSIS — I509 Heart failure, unspecified: Secondary | ICD-10-CM | POA: Diagnosis present

## 2014-06-12 DIAGNOSIS — Z9181 History of falling: Secondary | ICD-10-CM | POA: Diagnosis not present

## 2014-06-12 DIAGNOSIS — I2589 Other forms of chronic ischemic heart disease: Secondary | ICD-10-CM | POA: Diagnosis present

## 2014-06-12 DIAGNOSIS — E8771 Transfusion associated circulatory overload: Secondary | ICD-10-CM | POA: Diagnosis not present

## 2014-06-12 DIAGNOSIS — D500 Iron deficiency anemia secondary to blood loss (chronic): Secondary | ICD-10-CM | POA: Insufficient documentation

## 2014-06-12 DIAGNOSIS — Z951 Presence of aortocoronary bypass graft: Secondary | ICD-10-CM | POA: Diagnosis not present

## 2014-06-12 DIAGNOSIS — I495 Sick sinus syndrome: Secondary | ICD-10-CM | POA: Diagnosis present

## 2014-06-12 DIAGNOSIS — Z7901 Long term (current) use of anticoagulants: Secondary | ICD-10-CM | POA: Diagnosis not present

## 2014-06-12 DIAGNOSIS — Z95810 Presence of automatic (implantable) cardiac defibrillator: Secondary | ICD-10-CM | POA: Diagnosis not present

## 2014-06-12 DIAGNOSIS — R262 Difficulty in walking, not elsewhere classified: Secondary | ICD-10-CM | POA: Diagnosis not present

## 2014-06-12 DIAGNOSIS — I4891 Unspecified atrial fibrillation: Secondary | ICD-10-CM | POA: Diagnosis not present

## 2014-06-12 DIAGNOSIS — Z7982 Long term (current) use of aspirin: Secondary | ICD-10-CM | POA: Diagnosis not present

## 2014-06-12 DIAGNOSIS — R5381 Other malaise: Secondary | ICD-10-CM | POA: Diagnosis not present

## 2014-06-12 DIAGNOSIS — D509 Iron deficiency anemia, unspecified: Secondary | ICD-10-CM | POA: Diagnosis not present

## 2014-06-12 DIAGNOSIS — D696 Thrombocytopenia, unspecified: Secondary | ICD-10-CM | POA: Diagnosis not present

## 2014-06-12 DIAGNOSIS — E875 Hyperkalemia: Secondary | ICD-10-CM | POA: Diagnosis not present

## 2014-06-12 DIAGNOSIS — R0989 Other specified symptoms and signs involving the circulatory and respiratory systems: Secondary | ICD-10-CM | POA: Diagnosis not present

## 2014-06-12 DIAGNOSIS — N179 Acute kidney failure, unspecified: Secondary | ICD-10-CM | POA: Diagnosis not present

## 2014-06-12 DIAGNOSIS — E538 Deficiency of other specified B group vitamins: Secondary | ICD-10-CM | POA: Diagnosis present

## 2014-06-12 DIAGNOSIS — S8391XA Sprain of unspecified site of right knee, initial encounter: Secondary | ICD-10-CM | POA: Insufficient documentation

## 2014-06-12 DIAGNOSIS — I2510 Atherosclerotic heart disease of native coronary artery without angina pectoris: Secondary | ICD-10-CM | POA: Diagnosis not present

## 2014-06-12 DIAGNOSIS — I129 Hypertensive chronic kidney disease with stage 1 through stage 4 chronic kidney disease, or unspecified chronic kidney disease: Secondary | ICD-10-CM | POA: Diagnosis present

## 2014-06-12 DIAGNOSIS — E872 Acidosis, unspecified: Secondary | ICD-10-CM | POA: Diagnosis not present

## 2014-06-12 DIAGNOSIS — I5022 Chronic systolic (congestive) heart failure: Secondary | ICD-10-CM | POA: Diagnosis present

## 2014-06-12 DIAGNOSIS — N183 Chronic kidney disease, stage 3 unspecified: Secondary | ICD-10-CM | POA: Diagnosis not present

## 2014-06-12 DIAGNOSIS — Z5189 Encounter for other specified aftercare: Secondary | ICD-10-CM | POA: Diagnosis not present

## 2014-06-12 DIAGNOSIS — M109 Gout, unspecified: Secondary | ICD-10-CM | POA: Diagnosis present

## 2014-06-12 DIAGNOSIS — M25469 Effusion, unspecified knee: Secondary | ICD-10-CM | POA: Diagnosis not present

## 2014-06-12 DIAGNOSIS — D62 Acute posthemorrhagic anemia: Secondary | ICD-10-CM

## 2014-06-12 DIAGNOSIS — M6281 Muscle weakness (generalized): Secondary | ICD-10-CM | POA: Diagnosis not present

## 2014-06-12 DIAGNOSIS — M25461 Effusion, right knee: Secondary | ICD-10-CM

## 2014-06-12 DIAGNOSIS — S8000XA Contusion of unspecified knee, initial encounter: Secondary | ICD-10-CM | POA: Diagnosis not present

## 2014-06-12 DIAGNOSIS — D649 Anemia, unspecified: Secondary | ICD-10-CM | POA: Diagnosis not present

## 2014-06-12 DIAGNOSIS — E876 Hypokalemia: Secondary | ICD-10-CM | POA: Diagnosis present

## 2014-06-12 LAB — BASIC METABOLIC PANEL
Anion Gap: 9 (ref 7–16)
BUN: 79 mg/dL — ABNORMAL HIGH (ref 7–18)
Calcium, Total: 9.1 mg/dL (ref 8.5–10.1)
Chloride: 106 mmol/L (ref 98–107)
Co2: 20 mmol/L — ABNORMAL LOW (ref 21–32)
Creatinine: 2.5 mg/dL — ABNORMAL HIGH (ref 0.60–1.30)
EGFR (African American): 29 — ABNORMAL LOW
EGFR (Non-African Amer.): 25 — ABNORMAL LOW
Glucose: 114 mg/dL — ABNORMAL HIGH (ref 65–99)
Osmolality: 295 (ref 275–301)
Potassium: 5.3 mmol/L — ABNORMAL HIGH (ref 3.5–5.1)
Sodium: 135 mmol/L — ABNORMAL LOW (ref 136–145)

## 2014-06-12 LAB — CBC WITH DIFFERENTIAL/PLATELET
Basophil #: 0 10*3/uL (ref 0.0–0.1)
Basophil %: 0.7 %
Basophils Absolute: 0 10*3/uL (ref 0.0–0.1)
Basophils Relative: 0.4 % (ref 0.0–3.0)
Eosinophil #: 0.1 10*3/uL (ref 0.0–0.7)
Eosinophil %: 1 %
Eosinophils Absolute: 0 10*3/uL (ref 0.0–0.7)
Eosinophils Relative: 0.7 % (ref 0.0–5.0)
HCT: 22.6 % — CL (ref 39.0–52.0)
HCT: 24 % — ABNORMAL LOW (ref 40.0–52.0)
HGB: 7.7 g/dL — ABNORMAL LOW (ref 13.0–18.0)
Hemoglobin: 7.7 g/dL — CL (ref 13.0–17.0)
Lymphocyte #: 0.9 10*3/uL — ABNORMAL LOW (ref 1.0–3.6)
Lymphocyte %: 12.3 %
Lymphocytes Relative: 10.8 % — ABNORMAL LOW (ref 12.0–46.0)
Lymphs Abs: 0.7 10*3/uL (ref 0.7–4.0)
MCH: 32.1 pg (ref 26.0–34.0)
MCHC: 34 g/dL (ref 30.0–36.0)
MCHC: 32 g/dL (ref 32.0–36.0)
MCV: 98.7 fl (ref 78.0–100.0)
MCV: 100 fL (ref 80–100)
Monocyte #: 0.6 x10 3/mm (ref 0.2–1.0)
Monocyte %: 9 %
Monocytes Absolute: 0.6 10*3/uL (ref 0.1–1.0)
Monocytes Relative: 8.3 % (ref 3.0–12.0)
Neutro Abs: 5.5 10*3/uL (ref 1.4–7.7)
Neutrophil #: 5.4 10*3/uL (ref 1.4–6.5)
Neutrophil %: 77 %
Neutrophils Relative %: 79.8 % — ABNORMAL HIGH (ref 43.0–77.0)
Platelet: 135 10*3/uL — ABNORMAL LOW (ref 150–440)
Platelets: 137 10*3/uL — ABNORMAL LOW (ref 150.0–400.0)
RBC: 2.29 Mil/uL — ABNORMAL LOW (ref 4.22–5.81)
RBC: 2.39 10*6/uL — ABNORMAL LOW (ref 4.40–5.90)
RDW: 16.6 % — ABNORMAL HIGH (ref 11.5–15.5)
RDW: 15.9 % — ABNORMAL HIGH (ref 11.5–14.5)
WBC: 6.9 10*3/uL (ref 4.0–10.5)
WBC: 7 10*3/uL (ref 3.8–10.6)

## 2014-06-12 LAB — URINALYSIS, COMPLETE
Bilirubin,UR: NEGATIVE
Blood: NEGATIVE
Glucose,UR: NEGATIVE mg/dL (ref 0–75)
Ketone: NEGATIVE
Leukocyte Esterase: NEGATIVE
Nitrite: NEGATIVE
Ph: 5 (ref 4.5–8.0)
Protein: NEGATIVE
RBC,UR: NONE SEEN /HPF (ref 0–5)
Specific Gravity: 1.008 (ref 1.003–1.030)
Squamous Epithelial: <1
WBC UR: <1 /HPF (ref 0–5)

## 2014-06-12 LAB — PROTIME-INR
INR: 5.5 ratio (ref 0.8–1.0)
INR: 4.3
Prothrombin Time: 58.4 s (ref 9.6–13.1)
Prothrombin Time: 40.1 secs — ABNORMAL HIGH (ref 11.5–14.7)

## 2014-06-12 LAB — LACTATE DEHYDROGENASE: LDH: 236 U/L (ref 85–241)

## 2014-06-12 LAB — IRON AND TIBC
Iron Bind.Cap.(Total): 315 ug/dL (ref 250–450)
Iron Saturation: 10 %
Iron: 33 ug/dL — ABNORMAL LOW (ref 65–175)
Unbound Iron-Bind.Cap.: 282 ug/dL

## 2014-06-12 LAB — FOLATE: Folic Acid: 11.1 ng/mL (ref 3.1–100.0)

## 2014-06-12 LAB — HEMOGLOBIN: HGB: 7.1 g/dL — ABNORMAL LOW (ref 13.0–18.0)

## 2014-06-12 LAB — FERRITIN: Ferritin (ARMC): 440 ng/mL — ABNORMAL HIGH (ref 8–388)

## 2014-06-12 MED ORDER — HYDROCODONE-ACETAMINOPHEN 5-325 MG PO TABS: 1.0000 | ORAL_TABLET | ORAL | Status: DC | PRN

## 2014-06-12 NOTE — Assessment & Plan Note (Signed)
By exam. Ortho consult for drainage.

## 2014-06-12 NOTE — Telephone Encounter (Signed)
Called patient and notified patient immediately after receiving instructions from dr. Derrel Nip patient voiced understanding and faxed copy of office note to [facility] ED.

## 2014-06-12 NOTE — Telephone Encounter (Signed)
[facility] called Santiago Glad with critical Hgb = 7.7 ant hct = 22.6 please advise

## 2014-06-12 NOTE — Telephone Encounter (Signed)
Patient has become severely anemic and his coumadin level is way too high. He needs to go to the ER today and tell them his hgb is 7.7 and INR is 5.8 ,  They will repeat it and admit him for a blood transfusion if it is < 8

## 2014-06-12 NOTE — Progress Notes (Signed)
Pre-visit discussion using our clinic review tool. No additional management support is needed unless otherwise documented below in the visit note.  

## 2014-06-12 NOTE — Progress Notes (Addendum)
Patient ID: Rick Gilmore, male   DOB: 12-Mar-1941, 73 y.o.   MRN: [id number] Patient Active Problem List   Diagnosis Date Noted  . Sprain of right knee/leg 06/12/2014  . Effusion of prepatellar bursa 06/12/2014  . Acute blood loss anemia 06/12/2014  . Acute bronchitis 12/27/2013  . Anemia 05/07/2013  . Dyspnea and respiratory abnormality 05/07/2013  . Superficial fungus infection of skin 04/27/2013  . Long term (current) use of anticoagulants 11/02/2011  . Pernicious anemia 11/02/2011  . Hx of adenomatous colonic polyps 11/02/2011  . Personal history of prostate cancer   . Ischemic cardiomyopathy   . Sleep apnea, obstructive     Subjective:  CC:   Chief Complaint  Patient presents with  . Follow-up    hospital, from fall  was in [place] when he fell. Patient right knee.    HPI:   Rick Gilmore is a 73 y.o. male who presents for Knee swelling.  Patient had a fall onto his right knee on Friday June 24th during a vacation in [place],  [place] on a shoddy sidewalk that had a hole in it.   Went to an Geneticist, molecular a few days later , [facility] in [place], [place]. For perisistent right knee and right ankle pain.  His Right knee was swollen and red and warm.  X rays ruled out fractures.  He was not told to stop his coumadin.  He was given vicodin and clindamycin to take four times daily.Marland Kitchen  He has  only been taking clindamycin bid due to soft stools.  He is using crutches but having a difficult time ambulating because he is getting short of breath.  Has noticed blood in his mouth when waking up for the last few days,  Thinks his INR may be high   He is having trouble walking without assistance due to knee injury and history of congestive heart failure. He has tried using a cane and a regular walker but the strain on his upper body and other knee causes him to become short of breath requiring him to stop and sit down,.  He is requesting a rolling walker with wheels and a seat.    2) His friend has  been noticing  irritability,   Patient wants to sleep a lot.  Has OSA on CPAP last study has been around 7 years ago done at [facility]    Past Medical History  Diagnosis Date  . CAD (coronary artery disease)   . Atrial fibrillation     on Coumadin  . Hypertension   . Erectile dysfunction   . Hyperlipidemia   . Ischemic cardiomyopathy     s/p AICD/pacer 2009, replaced 2010 [facility]  . Cancer 2001    Prostate, XRT and implant  . Tubular adenoma     colon polyp  . Personal history of prostate cancer 2001    s/p XRT and implant ([facility])  . Sleep apnea, obstructive     uses CPAP nightly    Past Surgical History  Procedure Laterality Date  . Vasectomy    . Coronary artery bypass graft  1988       The following portions of the patient's history were reviewed and updated as appropriate: Allergies, current medications, and problem list.    Review of Systems:   Patient denies headache, fevers, malaise, unintentional weight loss, skin rash, eye pain, sinus congestion and sinus pain, sore throat, dysphagia,  hemoptysis , cough, dyspnea, wheezing, chest pain, palpitations, orthopnea, edema, abdominal pain, nausea,  melena, diarrhea, constipation, flank pain, dysuria, hematuria, urinary  Frequency, nocturia, numbness, tingling, seizures,  Focal weakness, Loss of consciousness,  Tremor, insomnia, depression, anxiety, and suicidal ideation.     History   Social History  . Marital Status: Divorced    Spouse Name: [name]    Number of Children: [name]  . Years of Education: [name]   Occupational History  . Not on file.   Social History Main Topics  . Smoking status: Never Smoker   . Smokeless tobacco: Never Used  . Alcohol Use: 1.0 oz/week    2 drink(s) per week  . Drug Use: No  . Sexual Activity: Not on file   Other Topics Concern  . Not on file   Social History Narrative  . No narrative on file    Objective:  Filed Vitals:   06/12/14 0838  BP: 114/48  Pulse: 70  Temp: 97.9 F  (36.6 C)  Resp: 18     General appearance: alert, cooperative and appears stated age Ears: normal TM's and external ear canals both ears Throat: lips, mucosa, and tongue normal; teeth and gums normal Neck: no adenopathy, no carotid bruit, supple, symmetrical, trachea midline and thyroid not enlarged, symmetric, no tenderness/mass/nodules Back: symmetric, no curvature. ROM normal. No CVA tenderness. Lungs: clear to auscultation bilaterally Heart: regular rate and rhythm, S1, S2 normal, no murmur, click, rub or gallop Abdomen: soft, non-tender; bowel sounds normal; no masses,  no organomegaly Pulses: 2+ and symmetric Skin: skin over right knee is warm and hot pink.   Large resolving ecchymosis right inner thigh.  Venous stasis changes to both extremities.  MSK: right prepatellar effusion  Lymph nodes: Cervical, supraclavicular, and axillary nodes normal.  Assessment and Plan:  Acute blood loss anemia Patient was sent for labs including hgb and INR.  Criticial values were received, and he was called and advised to go to ER for repeat assessment of  severe anemia in the setting of supratherapuetic INR secondary to recent use of antibiotics prescribed by outside physician.  Lab Results  Component Value Date   HGB 7.7 Repeated and verified X2.* 06/12/2014    Lab Results  Component Value Date   INR 5.5 Repeated and verified X2.* 06/12/2014   INR 2.8* 05/28/2014   INR 2.6* 04/23/2014     Sprain of right knee/leg Refill vicodin,  Ortho consult.  Rolling walker with seat.  Ice knee often.   Effusion of prepatellar bursa By exam. Ortho consult for drainage.    Updated Medication List Outpatient Encounter Prescriptions as of 06/12/2014  Medication Sig  . allopurinol (ZYLOPRIM) 300 MG tablet Take 1 tablet (300 mg total) by mouth daily.  Marland Kitchen aspirin 81 MG tablet Take 81 mg by mouth daily.    . clindamycin (CLEOCIN) 150 MG capsule Take 300 mg by mouth 4 (four) times daily.  . colchicine 0.6  MG tablet Take 0.6 mg by mouth as needed.  . cyanocobalamin (,VITAMIN B-12,) 1000 MCG/ML injection Inject 1,000 mcg into the muscle every 30 (thirty) days.  Marland Kitchen dextromethorphan (DELSYM) 30 MG/5ML liquid Take 2.5 mLs (15 mg total) by mouth as needed for cough.  . doxazosin (CARDURA) 4 MG tablet TAKE 1 TABLET AT BEDTIME  . furosemide (LASIX) 80 MG tablet TAKE 1 TABLET DAILY  . guaiFENesin-codeine 100-10 MG/5ML syrup Take 5 mLs by mouth 3 (three) times daily as needed.  Marland Kitchen HYDROcodone-acetaminophen (NORCO/VICODIN) 5-325 MG per tablet Take 1-2 tablets by mouth every 4 (four) hours as needed for moderate pain.  Marland Kitchen  lisinopril (PRINIVIL,ZESTRIL) 10 MG tablet TAKE ONE-HALF (1/2) TABLET TWICE A DAY  . metoprolol succinate (TOPROL-XL) 100 MG 24 hr tablet TAKE 1 TABLET DAILY  . NEXIUM 40 MG capsule TAKE 1 CAPSULE DAILY BEFORE BREAKFAST  . simvastatin (ZOCOR) 10 MG tablet TAKE 1 TABLET AT BEDTIME  . simvastatin (ZOCOR) 10 MG tablet TAKE 1 TABLET DAILY (CALL OFFICE SOON FOR APPOINTMENT WITH PRIMARY CARE PHYSICIAN)  . warfarin (COUMADIN) 4 MG tablet 1.5 tablets 3 days per week,  1 tablet 4 days per week  . [DISCONTINUED] HYDROcodone-acetaminophen (NORCO/VICODIN) 5-325 MG per tablet Take 1-2 tablets by mouth every 4 (four) hours as needed for moderate pain.  Marland Kitchen amoxicillin-clavulanate (AUGMENTIN) 875-125 MG per tablet Take 1 tablet by mouth 2 (two) times daily.     Orders Placed This Encounter  Procedures  . DME Other see comment  . INR/PT  . CBC with Differential  . Ambulatory referral to Orthopedic Surgery    No Follow-up on file.

## 2014-06-12 NOTE — Assessment & Plan Note (Addendum)
He requires a Rolling walker with seat for ambulation currently dut to knee spraain and concurrent CHF.   Will Refill vicodin,  Refer to Orthopedics.  Rx given for  Rolling walker with seat.  Ice knee often.

## 2014-06-12 NOTE — Assessment & Plan Note (Signed)
Patient advised to go to ER for repeat assessment severe anemia in the setting of supratherapuetic INR secondary to recent use of antibiotics prescribed by outside physician.  Lab Results  Component Value Date   HGB 7.7 Repeated and verified X2.* 06/12/2014    Lab Results  Component Value Date   INR 5.5 Repeated and verified X2.* 06/12/2014   INR 2.8* 05/28/2014   INR 2.6* 04/23/2014

## 2014-06-12 NOTE — Patient Instructions (Addendum)
.  Please take a probiotic ( Align, Floraque or Culturelle)  For the next two weeks  to prevent a serious antibiotic associated diarrhea  Called clostirudium dificile colitis and a vaginal yeast infection   Stop the clindamycin  INR today  Orthopedics referral to drain the prepatellar  effusion .  We will suspend the coumadin 3 days prior to your appt. To keep it from recurring   Ok to use ice for 15 minutes every couple of hours and elevated leg when possible.

## 2014-06-13 LAB — CBC WITH DIFFERENTIAL/PLATELET
Basophil #: 0.1 10*3/uL (ref 0.0–0.1)
Basophil %: 1 %
Eosinophil #: 0.1 10*3/uL (ref 0.0–0.7)
Eosinophil %: 1 %
HCT: 21.6 % — ABNORMAL LOW (ref 40.0–52.0)
HGB: 7.1 g/dL — ABNORMAL LOW (ref 13.0–18.0)
Lymphocyte #: 0.9 10*3/uL — ABNORMAL LOW (ref 1.0–3.6)
Lymphocyte %: 14.1 %
MCH: 34 pg (ref 26.0–34.0)
MCHC: 32.8 g/dL (ref 32.0–36.0)
MCV: 104 fL — ABNORMAL HIGH (ref 80–100)
Monocyte #: 0.6 x10 3/mm (ref 0.2–1.0)
Monocyte %: 8.7 %
Neutrophil #: 4.9 10*3/uL (ref 1.4–6.5)
Neutrophil %: 75.2 %
Platelet: 106 10*3/uL — ABNORMAL LOW (ref 150–440)
RBC: 2.08 10*6/uL — ABNORMAL LOW (ref 4.40–5.90)
RDW: 15.9 % — ABNORMAL HIGH (ref 11.5–14.5)
WBC: 6.5 10*3/uL (ref 3.8–10.6)

## 2014-06-13 LAB — BASIC METABOLIC PANEL
Anion Gap: 8 (ref 7–16)
BUN: 62 mg/dL — ABNORMAL HIGH (ref 7–18)
Calcium, Total: 8.7 mg/dL (ref 8.5–10.1)
Chloride: 109 mmol/L — ABNORMAL HIGH (ref 98–107)
Co2: 19 mmol/L — ABNORMAL LOW (ref 21–32)
Creatinine: 2.12 mg/dL — ABNORMAL HIGH (ref 0.60–1.30)
EGFR (African American): 35 — ABNORMAL LOW
EGFR (Non-African Amer.): 30 — ABNORMAL LOW
Glucose: 105 mg/dL — ABNORMAL HIGH (ref 65–99)
Osmolality: 290 (ref 275–301)
Potassium: 5.6 mmol/L — ABNORMAL HIGH (ref 3.5–5.1)
Sodium: 136 mmol/L (ref 136–145)

## 2014-06-13 LAB — PROTIME-INR
INR: 4.3
Prothrombin Time: 39.6 secs — ABNORMAL HIGH (ref 11.5–14.7)

## 2014-06-13 LAB — OCCULT BLOOD X 1 CARD TO LAB, STOOL: Occult Blood, Feces: NEGATIVE

## 2014-06-14 LAB — IRON AND TIBC
Iron Bind.Cap.(Total): 305 ug/dL (ref 250–450)
Iron Saturation: 7 %
Iron: 20 ug/dL — ABNORMAL LOW (ref 65–175)
Unbound Iron-Bind.Cap.: 285 ug/dL

## 2014-06-14 LAB — CBC WITH DIFFERENTIAL/PLATELET
Basophil #: 0 10*3/uL (ref 0.0–0.1)
Basophil %: 0.4 %
Eosinophil #: 0 10*3/uL (ref 0.0–0.7)
Eosinophil %: 0.4 %
HCT: 22.4 % — ABNORMAL LOW (ref 40.0–52.0)
HGB: 7.6 g/dL — ABNORMAL LOW (ref 13.0–18.0)
Lymphocyte #: 0.8 10*3/uL — ABNORMAL LOW (ref 1.0–3.6)
Lymphocyte %: 9.9 %
MCH: 34.5 pg — ABNORMAL HIGH (ref 26.0–34.0)
MCHC: 34 g/dL (ref 32.0–36.0)
MCV: 102 fL — ABNORMAL HIGH (ref 80–100)
Monocyte #: 0.7 x10 3/mm (ref 0.2–1.0)
Monocyte %: 9.1 %
Neutrophil #: 6.2 10*3/uL (ref 1.4–6.5)
Neutrophil %: 80.2 %
Platelet: 99 10*3/uL — ABNORMAL LOW (ref 150–440)
RBC: 2.2 10*6/uL — ABNORMAL LOW (ref 4.40–5.90)
RDW: 15.8 % — ABNORMAL HIGH (ref 11.5–14.5)
WBC: 7.7 10*3/uL (ref 3.8–10.6)

## 2014-06-14 LAB — BASIC METABOLIC PANEL
Anion Gap: 9 (ref 7–16)
BUN: 51 mg/dL — ABNORMAL HIGH (ref 7–18)
Calcium, Total: 8.3 mg/dL — ABNORMAL LOW (ref 8.5–10.1)
Chloride: 108 mmol/L — ABNORMAL HIGH (ref 98–107)
Co2: 21 mmol/L (ref 21–32)
Creatinine: 1.66 mg/dL — ABNORMAL HIGH (ref 0.60–1.30)
EGFR (African American): 47 — ABNORMAL LOW
EGFR (Non-African Amer.): 41 — ABNORMAL LOW
Glucose: 115 mg/dL — ABNORMAL HIGH (ref 65–99)
Osmolality: 290 (ref 275–301)
Potassium: 5.2 mmol/L — ABNORMAL HIGH (ref 3.5–5.1)
Sodium: 138 mmol/L (ref 136–145)

## 2014-06-14 LAB — PRO B NATRIURETIC PEPTIDE: B-Type Natriuretic Peptide: 6589 pg/mL — ABNORMAL HIGH (ref 0–125)

## 2014-06-14 LAB — FERRITIN: Ferritin (ARMC): 448 ng/mL — ABNORMAL HIGH (ref 8–388)

## 2014-06-15 LAB — BASIC METABOLIC PANEL
Anion Gap: 8 (ref 7–16)
BUN: 36 mg/dL — AB (ref 4–21)
BUN: 36 mg/dL — ABNORMAL HIGH (ref 7–18)
Calcium, Total: 8.5 mg/dL (ref 8.5–10.1)
Chloride: 109 mmol/L — ABNORMAL HIGH (ref 98–107)
Co2: 19 mmol/L — ABNORMAL LOW (ref 21–32)
Creatinine: 1.38 mg/dL — ABNORMAL HIGH (ref 0.60–1.30)
Creatinine: 1.4 mg/dL — AB (ref 0.6–1.3)
EGFR (African American): 59 — ABNORMAL LOW
EGFR (Non-African Amer.): 51 — ABNORMAL LOW
Glucose: 110 mg/dL
Glucose: 110 mg/dL — ABNORMAL HIGH (ref 65–99)
Osmolality: 281 (ref 275–301)
Potassium: 5 mmol/L (ref 3.4–5.3)
Potassium: 5 mmol/L (ref 3.5–5.1)
Sodium: 136 mmol/L (ref 136–145)
Sodium: 136 mmol/L — AB (ref 137–147)

## 2014-06-15 LAB — CBC WITH DIFFERENTIAL/PLATELET
Basophil #: 0 10*3/uL (ref 0.0–0.1)
Basophil %: 0.6 %
Eosinophil #: 0 10*3/uL (ref 0.0–0.7)
Eosinophil %: 0.4 %
HCT: 24.4 % — ABNORMAL LOW (ref 40.0–52.0)
HGB: 8.4 g/dL — ABNORMAL LOW (ref 13.0–18.0)
Lymphocyte #: 0.6 10*3/uL — ABNORMAL LOW (ref 1.0–3.6)
Lymphocyte %: 8.2 %
MCH: 33.5 pg (ref 26.0–34.0)
MCHC: 34.2 g/dL (ref 32.0–36.0)
MCV: 98 fL (ref 80–100)
Monocyte #: 0.8 x10 3/mm (ref 0.2–1.0)
Monocyte %: 10 %
Neutrophil #: 6.1 10*3/uL (ref 1.4–6.5)
Neutrophil %: 80.8 %
Platelet: 96 10*3/uL — ABNORMAL LOW (ref 150–440)
RBC: 2.5 10*6/uL — ABNORMAL LOW (ref 4.40–5.90)
RDW: 15.9 % — ABNORMAL HIGH (ref 11.5–14.5)
WBC: 7.5 10*3/uL (ref 3.8–10.6)

## 2014-06-15 LAB — PROTIME-INR
INR: 3.1
Prothrombin Time: 31.3 secs — ABNORMAL HIGH (ref 11.5–14.7)
Protime: 31.3 seconds — AB (ref 10.0–13.8)

## 2014-06-15 LAB — POCT INR: INR: 3.1 — AB (ref 0.9–1.1)

## 2014-06-15 LAB — CBC AND DIFFERENTIAL
HCT: 24 % — AB (ref 41–53)
Hemoglobin: 8.4 g/dL — AB (ref 13.5–17.5)
Neutrophils Absolute: 8 /uL
Platelets: 96 10*3/uL — AB (ref 150–399)

## 2014-06-16 LAB — BASIC METABOLIC PANEL
Anion Gap: 7 (ref 7–16)
BUN: 37 mg/dL — ABNORMAL HIGH (ref 7–18)
Calcium, Total: 8.5 mg/dL (ref 8.5–10.1)
Chloride: 104 mmol/L (ref 98–107)
Co2: 23 mmol/L (ref 21–32)
Creatinine: 1.41 mg/dL — ABNORMAL HIGH (ref 0.60–1.30)
EGFR (African American): 57 — ABNORMAL LOW
EGFR (Non-African Amer.): 49 — ABNORMAL LOW
Glucose: 103 mg/dL — ABNORMAL HIGH (ref 65–99)
Osmolality: 277 (ref 275–301)
Potassium: 4.6 mmol/L (ref 3.5–5.1)
Sodium: 134 mmol/L — ABNORMAL LOW (ref 136–145)

## 2014-06-16 LAB — HEMOGLOBIN: HGB: 8 g/dL — ABNORMAL LOW (ref 13.0–18.0)

## 2014-06-16 LAB — PROTIME-INR
INR: 3
Prothrombin Time: 29.9 secs — ABNORMAL HIGH (ref 11.5–14.7)

## 2014-06-17 DIAGNOSIS — I4891 Unspecified atrial fibrillation: Secondary | ICD-10-CM | POA: Diagnosis not present

## 2014-06-17 DIAGNOSIS — Z9181 History of falling: Secondary | ICD-10-CM | POA: Diagnosis not present

## 2014-06-17 DIAGNOSIS — I2510 Atherosclerotic heart disease of native coronary artery without angina pectoris: Secondary | ICD-10-CM | POA: Diagnosis not present

## 2014-06-17 DIAGNOSIS — E8771 Transfusion associated circulatory overload: Secondary | ICD-10-CM | POA: Diagnosis not present

## 2014-06-17 DIAGNOSIS — E872 Acidosis, unspecified: Secondary | ICD-10-CM | POA: Diagnosis not present

## 2014-06-17 DIAGNOSIS — IMO0002 Reserved for concepts with insufficient information to code with codable children: Secondary | ICD-10-CM | POA: Diagnosis not present

## 2014-06-17 DIAGNOSIS — Z5189 Encounter for other specified aftercare: Secondary | ICD-10-CM | POA: Diagnosis not present

## 2014-06-17 DIAGNOSIS — M6281 Muscle weakness (generalized): Secondary | ICD-10-CM | POA: Diagnosis not present

## 2014-06-17 DIAGNOSIS — R262 Difficulty in walking, not elsewhere classified: Secondary | ICD-10-CM | POA: Diagnosis not present

## 2014-06-17 DIAGNOSIS — R5381 Other malaise: Secondary | ICD-10-CM | POA: Diagnosis not present

## 2014-06-17 DIAGNOSIS — R5383 Other fatigue: Secondary | ICD-10-CM | POA: Diagnosis not present

## 2014-06-17 DIAGNOSIS — D696 Thrombocytopenia, unspecified: Secondary | ICD-10-CM | POA: Diagnosis not present

## 2014-06-17 DIAGNOSIS — D649 Anemia, unspecified: Secondary | ICD-10-CM | POA: Diagnosis not present

## 2014-06-17 DIAGNOSIS — N179 Acute kidney failure, unspecified: Secondary | ICD-10-CM | POA: Diagnosis not present

## 2014-06-17 DIAGNOSIS — I129 Hypertensive chronic kidney disease with stage 1 through stage 4 chronic kidney disease, or unspecified chronic kidney disease: Secondary | ICD-10-CM | POA: Diagnosis not present

## 2014-06-17 DIAGNOSIS — M25469 Effusion, unspecified knee: Secondary | ICD-10-CM | POA: Diagnosis not present

## 2014-06-17 LAB — KAPPA/LAMBDA FREE LIGHT CHAINS (ARMC)

## 2014-06-17 LAB — PROTIME-INR
INR: 2.3
Prothrombin Time: 24.4 secs — ABNORMAL HIGH (ref 11.5–14.7)

## 2014-06-17 LAB — UR PROT ELECTROPHORESIS, URINE RANDOM

## 2014-06-17 LAB — HEMOGLOBIN: HGB: 8.2 g/dL — ABNORMAL LOW (ref 13.0–18.0)

## 2014-06-17 LAB — PROTEIN ELECTROPHORESIS(ARMC)

## 2014-06-18 ENCOUNTER — Encounter: Payer: Self-pay | Admitting: Internal Medicine

## 2014-06-19 ENCOUNTER — Other Ambulatory Visit: Payer: Self-pay | Admitting: Internal Medicine

## 2014-06-19 DIAGNOSIS — I4891 Unspecified atrial fibrillation: Secondary | ICD-10-CM | POA: Diagnosis not present

## 2014-06-19 DIAGNOSIS — D649 Anemia, unspecified: Secondary | ICD-10-CM | POA: Diagnosis not present

## 2014-06-22 ENCOUNTER — Other Ambulatory Visit: Payer: Self-pay | Admitting: Internal Medicine

## 2014-06-23 ENCOUNTER — Other Ambulatory Visit: Payer: Self-pay | Admitting: Internal Medicine

## 2014-06-23 DIAGNOSIS — D500 Iron deficiency anemia secondary to blood loss (chronic): Secondary | ICD-10-CM

## 2014-06-23 NOTE — Assessment & Plan Note (Signed)
Admitted to [facility] July 8 with hgb 7.7 INR > 5

## 2014-06-24 LAB — BASIC METABOLIC PANEL
Anion Gap: 8 (ref 7–16)
BUN: 22 mg/dL — ABNORMAL HIGH (ref 7–18)
Calcium, Total: 8.3 mg/dL — ABNORMAL LOW (ref 8.5–10.1)
Chloride: 108 mmol/L — ABNORMAL HIGH (ref 98–107)
Co2: 24 mmol/L (ref 21–32)
Creatinine: 1.11 mg/dL (ref 0.60–1.30)
EGFR (African American): >60
EGFR (Non-African Amer.): >60
Glucose: 93 mg/dL (ref 65–99)
Osmolality: 282 (ref 275–301)
Potassium: 4.3 mmol/L (ref 3.5–5.1)
Sodium: 140 mmol/L (ref 136–145)

## 2014-06-24 LAB — HEMOGLOBIN A1C: Hemoglobin A1C: 5.6 % (ref 4.2–6.3)

## 2014-06-25 LAB — CBC WITH DIFFERENTIAL/PLATELET
Basophil #: 0 10*3/uL (ref 0.0–0.1)
Basophil %: 0.6 %
Eosinophil #: 0.1 10*3/uL (ref 0.0–0.7)
Eosinophil %: 1.3 %
HCT: 26.8 % — ABNORMAL LOW (ref 40.0–52.0)
HGB: 8.7 g/dL — ABNORMAL LOW (ref 13.0–18.0)
Lymphocyte #: 0.7 10*3/uL — ABNORMAL LOW (ref 1.0–3.6)
Lymphocyte %: 14.8 %
MCH: 32.8 pg (ref 26.0–34.0)
MCHC: 32.5 g/dL (ref 32.0–36.0)
MCV: 101 fL — ABNORMAL HIGH (ref 80–100)
Monocyte #: 0.5 x10 3/mm (ref 0.2–1.0)
Monocyte %: 9.6 %
Neutrophil #: 3.6 10*3/uL (ref 1.4–6.5)
Neutrophil %: 73.7 %
Platelet: 120 10*3/uL — ABNORMAL LOW (ref 150–440)
RBC: 2.65 10*6/uL — ABNORMAL LOW (ref 4.40–5.90)
RDW: 16.2 % — ABNORMAL HIGH (ref 11.5–14.5)
WBC: 4.9 10*3/uL (ref 3.8–10.6)

## 2014-06-28 DIAGNOSIS — S8000XA Contusion of unspecified knee, initial encounter: Secondary | ICD-10-CM | POA: Diagnosis not present

## 2014-07-01 ENCOUNTER — Telehealth: Payer: Self-pay | Admitting: Internal Medicine

## 2014-07-01 NOTE — Telephone Encounter (Signed)
Pt just called in and stated they was just seen in the hospital and needs to make a hospital follow up appt after aug 3rd but needs something soon. Rick Gilmore he has changed a lot of his meds.

## 2014-07-01 NOTE — Telephone Encounter (Signed)
Patient scheduled for august 11.

## 2014-07-01 NOTE — Telephone Encounter (Signed)
Give him this Thursday 8 am  It is currently blocked

## 2014-07-01 NOTE — Telephone Encounter (Signed)
We could do 2.30 on 8/19 is this ok?

## 2014-07-01 NOTE — Telephone Encounter (Signed)
Give Rick Gilmore the 11:30 slot on Thursday

## 2014-07-02 NOTE — Telephone Encounter (Signed)
Patient scheduled for 2.30 on 07/16/14

## 2014-07-04 ENCOUNTER — Ambulatory Visit: Payer: Self-pay | Admitting: Oncology

## 2014-07-04 DIAGNOSIS — I25810 Atherosclerosis of coronary artery bypass graft(s) without angina pectoris: Secondary | ICD-10-CM | POA: Diagnosis not present

## 2014-07-04 DIAGNOSIS — Z8546 Personal history of malignant neoplasm of prostate: Secondary | ICD-10-CM | POA: Diagnosis not present

## 2014-07-04 DIAGNOSIS — I428 Other cardiomyopathies: Secondary | ICD-10-CM | POA: Diagnosis not present

## 2014-07-04 DIAGNOSIS — I10 Essential (primary) hypertension: Secondary | ICD-10-CM | POA: Diagnosis not present

## 2014-07-04 DIAGNOSIS — Z79899 Other long term (current) drug therapy: Secondary | ICD-10-CM | POA: Diagnosis not present

## 2014-07-04 DIAGNOSIS — I2589 Other forms of chronic ischemic heart disease: Secondary | ICD-10-CM | POA: Diagnosis not present

## 2014-07-04 DIAGNOSIS — Z951 Presence of aortocoronary bypass graft: Secondary | ICD-10-CM | POA: Diagnosis not present

## 2014-07-04 DIAGNOSIS — Z923 Personal history of irradiation: Secondary | ICD-10-CM | POA: Diagnosis not present

## 2014-07-04 DIAGNOSIS — I4891 Unspecified atrial fibrillation: Secondary | ICD-10-CM | POA: Diagnosis not present

## 2014-07-04 DIAGNOSIS — I4430 Unspecified atrioventricular block: Secondary | ICD-10-CM | POA: Diagnosis not present

## 2014-07-04 DIAGNOSIS — I2510 Atherosclerotic heart disease of native coronary artery without angina pectoris: Secondary | ICD-10-CM | POA: Diagnosis not present

## 2014-07-04 DIAGNOSIS — E538 Deficiency of other specified B group vitamins: Secondary | ICD-10-CM | POA: Diagnosis not present

## 2014-07-04 DIAGNOSIS — D696 Thrombocytopenia, unspecified: Secondary | ICD-10-CM | POA: Diagnosis not present

## 2014-07-04 DIAGNOSIS — D649 Anemia, unspecified: Secondary | ICD-10-CM | POA: Diagnosis not present

## 2014-07-04 DIAGNOSIS — G4733 Obstructive sleep apnea (adult) (pediatric): Secondary | ICD-10-CM | POA: Diagnosis not present

## 2014-07-04 LAB — LACTATE DEHYDROGENASE: LDH: 207 U/L (ref 85–241)

## 2014-07-04 LAB — CBC CANCER CENTER
Basophil #: 0.1 x10 3/mm (ref 0.0–0.1)
Basophil %: 1.2 %
Eosinophil #: 0.1 x10 3/mm (ref 0.0–0.7)
Eosinophil %: 1.4 %
HCT: 30 % — ABNORMAL LOW (ref 40.0–52.0)
HGB: 9.8 g/dL — ABNORMAL LOW (ref 13.0–18.0)
Lymphocyte #: 1.1 x10 3/mm (ref 1.0–3.6)
Lymphocyte %: 20.8 %
MCH: 32.5 pg (ref 26.0–34.0)
MCHC: 32.8 g/dL (ref 32.0–36.0)
MCV: 99 fL (ref 80–100)
Monocyte #: 0.6 x10 3/mm (ref 0.2–1.0)
Monocyte %: 10.6 %
Neutrophil #: 3.6 x10 3/mm (ref 1.4–6.5)
Neutrophil %: 66 %
Platelet: 121 x10 3/mm — ABNORMAL LOW (ref 150–440)
RBC: 3.03 10*6/uL — ABNORMAL LOW (ref 4.40–5.90)
RDW: 16 % — ABNORMAL HIGH (ref 11.5–14.5)
WBC: 5.5 x10 3/mm (ref 3.8–10.6)

## 2014-07-04 LAB — FOLATE: Folic Acid: 11.5 ng/mL (ref 3.1–100.0)

## 2014-07-04 LAB — IRON AND TIBC
Iron Bind.Cap.(Total): 307 ug/dL (ref 250–450)
Iron Saturation: 15 %
Iron: 46 ug/dL — ABNORMAL LOW (ref 65–175)
Unbound Iron-Bind.Cap.: 261 ug/dL

## 2014-07-04 LAB — FERRITIN: Ferritin (ARMC): 363 ng/mL (ref 8–388)

## 2014-07-05 DIAGNOSIS — I10 Essential (primary) hypertension: Secondary | ICD-10-CM | POA: Diagnosis not present

## 2014-07-05 DIAGNOSIS — D649 Anemia, unspecified: Secondary | ICD-10-CM | POA: Diagnosis not present

## 2014-07-05 DIAGNOSIS — N179 Acute kidney failure, unspecified: Secondary | ICD-10-CM | POA: Diagnosis not present

## 2014-07-05 DIAGNOSIS — R609 Edema, unspecified: Secondary | ICD-10-CM | POA: Diagnosis not present

## 2014-07-06 ENCOUNTER — Ambulatory Visit: Payer: Self-pay | Admitting: Oncology

## 2014-07-06 DIAGNOSIS — E538 Deficiency of other specified B group vitamins: Secondary | ICD-10-CM | POA: Diagnosis not present

## 2014-07-06 DIAGNOSIS — D649 Anemia, unspecified: Secondary | ICD-10-CM | POA: Diagnosis not present

## 2014-07-06 DIAGNOSIS — D696 Thrombocytopenia, unspecified: Secondary | ICD-10-CM | POA: Diagnosis not present

## 2014-07-11 ENCOUNTER — Other Ambulatory Visit: Payer: Self-pay | Admitting: Internal Medicine

## 2014-07-16 ENCOUNTER — Ambulatory Visit (INDEPENDENT_AMBULATORY_CARE_PROVIDER_SITE_OTHER): Payer: Medicare Other | Admitting: Internal Medicine

## 2014-07-16 ENCOUNTER — Encounter: Payer: Self-pay | Admitting: Internal Medicine

## 2014-07-16 VITALS — BP 110/58 | HR 85 | Temp 98.4°F | Resp 16 | Ht 75.0 in | Wt 282.8 lb

## 2014-07-16 DIAGNOSIS — I255 Ischemic cardiomyopathy: Secondary | ICD-10-CM

## 2014-07-16 DIAGNOSIS — I2589 Other forms of chronic ischemic heart disease: Secondary | ICD-10-CM

## 2014-07-16 DIAGNOSIS — Z86010 Personal history of colonic polyps: Secondary | ICD-10-CM | POA: Diagnosis not present

## 2014-07-16 DIAGNOSIS — D62 Acute posthemorrhagic anemia: Secondary | ICD-10-CM | POA: Diagnosis not present

## 2014-07-16 DIAGNOSIS — Z7901 Long term (current) use of anticoagulants: Secondary | ICD-10-CM

## 2014-07-16 MED ORDER — ESCITALOPRAM OXALATE 10 MG PO TABS: 10.0000 mg | ORAL_TABLET | Freq: Every day | ORAL | Status: DC

## 2014-07-16 MED ORDER — POLYSACCHARIDE IRON COMPLEX 150 MG PO CAPS: 150.0000 mg | ORAL_CAPSULE | Freq: Every day | ORAL | Status: DC

## 2014-07-16 MED ORDER — COLCHICINE 0.6 MG PO TABS: 0.6000 mg | ORAL_TABLET | Freq: Two times a day (BID) | ORAL | Status: DC

## 2014-07-16 MED ORDER — APIXABAN 5 MG PO TABS: 5.0000 mg | ORAL_TABLET | Freq: Two times a day (BID) | ORAL | Status: DC

## 2014-07-16 NOTE — Assessment & Plan Note (Signed)
He is due for 5 year follow up on colonic polyps.

## 2014-07-16 NOTE — Assessment & Plan Note (Signed)
Warfarin has been discontinued and Eliquis bid started.

## 2014-07-16 NOTE — Assessment & Plan Note (Signed)
S/p transfusion of 2 units recently for large hematoma knee.  wil resuem quarterly CBCs and iron studies. Starting in October.

## 2014-07-16 NOTE — Assessment & Plan Note (Signed)
Advised to increase lasix as needed for weight  gain of 2  lbs overnight

## 2014-07-16 NOTE — Patient Instructions (Addendum)
Take an extra lasix  dose either this afternoon or at noon tomorrow.  Going forward,  take one  extra dose of lasix anytime you see a  wt gain of 2 lbs or more overnight or 5 lbs in one  week,   We will repeat iron studies in October   Flu vaccine will be available soon  You do need a colonoscopy this year.    Trial of generic lexapro to take for depression. Start with 1/2 tablet daily after dinner,  Increase to full tablet if tolerated after 4 days   Returnin 1 month  Repeat iron labs in October here.

## 2014-07-16 NOTE — Progress Notes (Signed)
Patient ID: Rick Gilmore, male   DOB: 07/20/1941, 73 y.o.   MRN: [id number]  Patient Active Problem List   Diagnosis Date Noted  . Sprain of right knee/leg 06/12/2014  . Effusion of prepatellar bursa 06/12/2014  . Acute blood loss anemia 06/12/2014  . Acute bronchitis 12/27/2013  . Anemia 05/07/2013  . Dyspnea and respiratory abnormality 05/07/2013  . Long term (current) use of anticoagulants 11/02/2011  . Pernicious anemia 11/02/2011  . Hx of adenomatous colonic polyps 11/02/2011  . Personal history of prostate cancer   . Ischemic cardiomyopathy   . Sleep apnea, obstructive     Subjective:  CC:   Chief Complaint  Patient presents with  . Follow-up    hopital follow up , anemic was given 2 units whole blood reported by patient.    HPI:   Rick Gilmore is a 73 y.o. 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  Received 2 units of blood,    Heme negative stool.  Got volume overloaded,   allopurinol dose decreased, and Lisinopril dose reduced Coumadin was stopped and eliquis started.   Knee feels fine,.  Ankles feel swollen . Weight started increasing last week  But yesterday had salt .  takign lasxi 80 mg once daily not taking sprionoilactone anymore.   GFR was 56 last week at nephrology  Had 2 units of iron transfused since discharge.  Friend noticed symptoms of depression, including fatigue, pessimism,   Discussed a trial of lexapro   Past Medical History  Diagnosis Date  . CAD (coronary artery disease)   . Atrial fibrillation     on Coumadin  . Hypertension   . Erectile  dysfunction   . Hyperlipidemia   . Ischemic cardiomyopathy     s/p AICD/pacer 2009, replaced 2010 [facility]  . Cancer 2001    Prostate, XRT and implant  . Tubular adenoma     colon polyp  . Personal history of prostate cancer 2001    s/p XRT and implant ([facility])  . Sleep apnea, obstructive     uses CPAP nightly    Past Surgical History  Procedure Laterality Date  . Vasectomy    . Coronary artery bypass graft  1988       The following portions of the patient's history were reviewed and updated as appropriate: Allergies, current medications, and problem list.    Review of Systems:   Patient denies headache, fevers, malaise, unintentional weight loss, skin rash, eye pain, sinus congestion and sinus pain, sore throat, dysphagia,  hemoptysis , cough, dyspnea, wheezing, chest pain, palpitations, orthopnea, edema, abdominal pain, nausea, melena, diarrhea, constipation, flank pain, dysuria, hematuria, urinary  Frequency, nocturia, numbness, tingling, seizures,  Focal weakness, Loss of consciousness,  Tremor, insomnia, depression, anxiety, and suicidal ideation.     History   Social History  . Marital Status: Single    Spouse Name: [name]    Number of Children: [name]  . Years of Education: [name]   Occupational History  . Not on file.   Social History Main Topics  . Smoking status: Never Smoker   . Smokeless  tobacco: Never Used  . Alcohol Use: 1.0 oz/week    2 drink(s) per week  . Drug Use: No  . Sexual Activity: Not on file   Other Topics Concern  . Not on file   Social History Narrative  . No narrative on file    Objective:  Filed Vitals:   07/16/14 1428  BP: 110/58  Pulse: 85  Temp: 98.4 F (36.9 C)  Resp: 16     General appearance: alert, cooperative and appears stated age Ears: normal TM's and external ear canals both ears Throat: lips, mucosa, and tongue normal; teeth and gums normal Neck: no adenopathy, no carotid bruit, supple, symmetrical, trachea midline and  thyroid not enlarged, symmetric, no tenderness/mass/nodules Back: symmetric, no curvature. ROM normal. No CVA tenderness. Lungs: clear to auscultation bilaterally Heart: regular rate and rhythm, S1, S2 normal, no murmur, click, rub or gallop Abdomen: soft, non-tender; bowel sounds normal; no masses,  no organomegaly Pulses: 2+ and symmetric Skin: Skin color, texture, turgor normal. No rashes or lesions Lymph nodes: Cervical, supraclavicular, and axillary nodes normal.  Assessment and Plan:  Ischemic cardiomyopathy Advised to increase lasix as needed for weight  gain of 2  lbs overnight  Acute blood loss anemia S/p transfusion of 2 units recently for large hematoma knee.  wil resuem quarterly CBCs and iron studies. Starting in October.   Long term (current) use of anticoagulants Warfarin has been discontinued and Eliquis bid started.   Hx of adenomatous colonic polyps He is due for 5 year follow up on colonic polyps.    Updated Medication List Outpatient Encounter Prescriptions as of 07/16/2014  Medication Sig  . allopurinol (ZYLOPRIM) 300 MG tablet Take 1 tablet (300 mg total) by mouth daily.  Marland Kitchen apixaban (ELIQUIS) 5 MG TABS tablet Take 1 tablet (5 mg total) by mouth 2 (two) times daily.  . clindamycin (CLEOCIN) 150 MG capsule Take 300 mg by mouth 4 (four) times daily.  . colchicine 0.6 MG tablet Take 0.6 mg by mouth as needed.  . cyanocobalamin (,VITAMIN B-12,) 1000 MCG/ML injection Inject 1,000 mcg into the muscle every 30 (thirty) days.  Marland Kitchen doxazosin (CARDURA) 4 MG tablet TAKE 1 TABLET AT BEDTIME  . furosemide (LASIX) 80 MG tablet TAKE 1 TABLET DAILY  . iron polysaccharides (FERREX 150) 150 MG capsule Take 1 capsule (150 mg total) by mouth daily.  Marland Kitchen lactobacillus acidophilus (BACID) TABS tablet Take 2 tablets by mouth daily.  Marland Kitchen lisinopril (PRINIVIL,ZESTRIL) 10 MG tablet Take 0.5 mg daily one half tablet  . metoprolol succinate (TOPROL-XL) 100 MG 24 hr tablet TAKE 1 TABLET DAILY   . NEXIUM 40 MG capsule TAKE 1 CAPSULE DAILY BEFORE BREAKFAST  . simvastatin (ZOCOR) 10 MG tablet TAKE 1 TABLET AT BEDTIME  . simvastatin (ZOCOR) 10 MG tablet TAKE 1 TABLET DAILY (CALL OFFICE SOON FOR APPOINTMENT WITH PRIMARY CARE PHYSICIAN)  . [DISCONTINUED] apixaban (ELIQUIS) 5 MG TABS tablet Take 5 mg by mouth 2 (two) times daily.  . [DISCONTINUED] apixaban (ELIQUIS) 5 MG TABS tablet Take 1 tablet (5 mg total) by mouth 2 (two) times daily.  . [DISCONTINUED] iron polysaccharides (FERREX 150) 150 MG capsule Take 150 mg by mouth daily.  . [DISCONTINUED] iron polysaccharides (FERREX 150) 150 MG capsule Take 1 capsule (150 mg total) by mouth daily.  . [DISCONTINUED] lisinopril (PRINIVIL,ZESTRIL) 10 MG tablet TAKE ONE-HALF (1/2) TABLET TWICE A DAY  . amoxicillin-clavulanate (AUGMENTIN) 875-125 MG per tablet Take 1 tablet by mouth 2 (two) times daily.  Marland Kitchen  aspirin 81 MG tablet Take 81 mg by mouth daily.    . colchicine 0.6 MG tablet Take 1 tablet (0.6 mg total) by mouth 2 (two) times daily.  Marland Kitchen dextromethorphan (DELSYM) 30 MG/5ML liquid Take 2.5 mLs (15 mg total) by mouth as needed for cough.  . escitalopram (LEXAPRO) 10 MG tablet Take 1 tablet (10 mg total) by mouth daily.  Marland Kitchen guaiFENesin-codeine 100-10 MG/5ML syrup Take 5 mLs by mouth 3 (three) times daily as needed.  . [DISCONTINUED] HYDROcodone-acetaminophen (NORCO/VICODIN) 5-325 MG per tablet Take 1-2 tablets by mouth every 4 (four) hours as needed for moderate pain.  . [DISCONTINUED] warfarin (COUMADIN) 4 MG tablet 1.5 tablets 3 days per week,  1 tablet 4 days per week  . [DISCONTINUED] warfarin (COUMADIN) 4 MG tablet TAKE ONE AND ONE-HALF TABLETS (6 MG) ON WEDNESDAY AND SATURDAY, AND TAKE 1 TABLET ON ALL OTHER DAYS     No orders of the defined types were placed in this encounter.    Return in about 3 months (around 10/16/2014).

## 2014-07-17 ENCOUNTER — Other Ambulatory Visit: Payer: Self-pay | Admitting: Internal Medicine

## 2014-07-17 ENCOUNTER — Encounter: Payer: Self-pay | Admitting: Internal Medicine

## 2014-07-17 ENCOUNTER — Telehealth: Payer: Self-pay | Admitting: Internal Medicine

## 2014-07-18 ENCOUNTER — Encounter: Payer: Self-pay | Admitting: Internal Medicine

## 2014-07-23 ENCOUNTER — Encounter: Payer: Self-pay | Admitting: Internal Medicine

## 2014-07-23 DIAGNOSIS — Z79899 Other long term (current) drug therapy: Secondary | ICD-10-CM

## 2014-07-23 DIAGNOSIS — D62 Acute posthemorrhagic anemia: Secondary | ICD-10-CM

## 2014-07-25 ENCOUNTER — Other Ambulatory Visit: Payer: Self-pay | Admitting: Internal Medicine

## 2014-07-25 LAB — CBC CANCER CENTER
Basophil #: 0.1 x10 3/mm (ref 0.0–0.1)
Basophil %: 1.1 %
Eosinophil #: 0.1 x10 3/mm (ref 0.0–0.7)
Eosinophil %: 0.8 %
HCT: 31.5 % — ABNORMAL LOW (ref 40.0–52.0)
HGB: 10.2 g/dL — ABNORMAL LOW (ref 13.0–18.0)
Lymphocyte #: 0.9 x10 3/mm — ABNORMAL LOW (ref 1.0–3.6)
Lymphocyte %: 14.6 %
MCH: 32 pg (ref 26.0–34.0)
MCHC: 32.4 g/dL (ref 32.0–36.0)
MCV: 99 fL (ref 80–100)
Monocyte #: 0.5 x10 3/mm (ref 0.2–1.0)
Monocyte %: 8.1 %
Neutrophil #: 4.7 x10 3/mm (ref 1.4–6.5)
Neutrophil %: 75.4 %
Platelet: 102 x10 3/mm — ABNORMAL LOW (ref 150–440)
RBC: 3.18 10*6/uL — ABNORMAL LOW (ref 4.40–5.90)
RDW: 16.2 % — ABNORMAL HIGH (ref 11.5–14.5)
WBC: 6.3 x10 3/mm (ref 3.8–10.6)

## 2014-07-25 LAB — IRON AND TIBC
Iron Bind.Cap.(Total): 281 ug/dL (ref 250–450)
Iron Saturation: 25 %
Iron: 71 ug/dL (ref 65–175)
Unbound Iron-Bind.Cap.: 210 ug/dL

## 2014-07-25 LAB — FERRITIN: Ferritin (ARMC): 639 ng/mL — ABNORMAL HIGH (ref 8–388)

## 2014-07-30 ENCOUNTER — Encounter: Payer: Self-pay | Admitting: Internal Medicine

## 2014-07-30 DIAGNOSIS — I255 Ischemic cardiomyopathy: Secondary | ICD-10-CM

## 2014-07-30 DIAGNOSIS — R5382 Chronic fatigue, unspecified: Secondary | ICD-10-CM

## 2014-07-30 DIAGNOSIS — I420 Dilated cardiomyopathy: Principal | ICD-10-CM

## 2014-08-05 ENCOUNTER — Other Ambulatory Visit: Payer: Self-pay | Admitting: *Deleted

## 2014-08-05 MED ORDER — ESCITALOPRAM OXALATE 10 MG PO TABS: 10.0000 mg | ORAL_TABLET | Freq: Every day | ORAL | Status: DC

## 2014-08-06 ENCOUNTER — Ambulatory Visit: Payer: Self-pay | Admitting: Oncology

## 2014-08-06 ENCOUNTER — Other Ambulatory Visit: Payer: Self-pay | Admitting: Internal Medicine

## 2014-08-08 ENCOUNTER — Ambulatory Visit (INDEPENDENT_AMBULATORY_CARE_PROVIDER_SITE_OTHER): Payer: Medicare Other | Admitting: *Deleted

## 2014-08-08 DIAGNOSIS — E538 Deficiency of other specified B group vitamins: Secondary | ICD-10-CM | POA: Diagnosis not present

## 2014-08-08 DIAGNOSIS — Z23 Encounter for immunization: Secondary | ICD-10-CM | POA: Diagnosis not present

## 2014-08-08 MED ORDER — CYANOCOBALAMIN 1000 MCG/ML IJ SOLN
1000.0000 ug | Freq: Once | INTRAMUSCULAR | Status: AC
  Administered 2014-08-08: 1000 ug via INTRAMUSCULAR

## 2014-08-08 NOTE — Telephone Encounter (Signed)
Patient will be out of town for most of September.. But would like to start in October.

## 2014-08-26 ENCOUNTER — Encounter: Payer: Self-pay | Admitting: Internal Medicine

## 2014-09-02 ENCOUNTER — Encounter: Payer: Self-pay | Admitting: Internal Medicine

## 2014-09-02 DIAGNOSIS — Z5189 Encounter for other specified aftercare: Secondary | ICD-10-CM | POA: Diagnosis not present

## 2014-09-02 DIAGNOSIS — Z951 Presence of aortocoronary bypass graft: Secondary | ICD-10-CM | POA: Diagnosis not present

## 2014-09-02 DIAGNOSIS — I5022 Chronic systolic (congestive) heart failure: Secondary | ICD-10-CM | POA: Diagnosis not present

## 2014-09-04 ENCOUNTER — Encounter: Payer: Self-pay | Admitting: Internal Medicine

## 2014-09-04 DIAGNOSIS — R0602 Shortness of breath: Secondary | ICD-10-CM | POA: Diagnosis not present

## 2014-09-04 DIAGNOSIS — J811 Chronic pulmonary edema: Secondary | ICD-10-CM | POA: Diagnosis not present

## 2014-09-04 DIAGNOSIS — J449 Chronic obstructive pulmonary disease, unspecified: Secondary | ICD-10-CM | POA: Diagnosis not present

## 2014-09-04 DIAGNOSIS — R918 Other nonspecific abnormal finding of lung field: Secondary | ICD-10-CM | POA: Diagnosis not present

## 2014-09-04 DIAGNOSIS — J90 Pleural effusion, not elsewhere classified: Secondary | ICD-10-CM | POA: Diagnosis not present

## 2014-09-04 DIAGNOSIS — I428 Other cardiomyopathies: Secondary | ICD-10-CM | POA: Diagnosis not present

## 2014-09-05 ENCOUNTER — Encounter: Payer: Self-pay | Admitting: Internal Medicine

## 2014-09-05 ENCOUNTER — Ambulatory Visit: Payer: Self-pay | Admitting: Oncology

## 2014-09-05 DIAGNOSIS — I5022 Chronic systolic (congestive) heart failure: Secondary | ICD-10-CM | POA: Diagnosis not present

## 2014-09-05 DIAGNOSIS — R5383 Other fatigue: Secondary | ICD-10-CM | POA: Diagnosis not present

## 2014-09-05 DIAGNOSIS — R531 Weakness: Secondary | ICD-10-CM | POA: Diagnosis not present

## 2014-09-05 DIAGNOSIS — D509 Iron deficiency anemia, unspecified: Secondary | ICD-10-CM | POA: Diagnosis not present

## 2014-09-05 DIAGNOSIS — D696 Thrombocytopenia, unspecified: Secondary | ICD-10-CM | POA: Diagnosis not present

## 2014-09-05 DIAGNOSIS — Z951 Presence of aortocoronary bypass graft: Secondary | ICD-10-CM | POA: Diagnosis not present

## 2014-09-05 LAB — CBC CANCER CENTER
Basophil #: 0 x10 3/mm (ref 0.0–0.1)
Basophil %: 0.8 %
Eosinophil #: 0 x10 3/mm (ref 0.0–0.7)
Eosinophil %: 0.7 %
HCT: 34.7 % — ABNORMAL LOW (ref 40.0–52.0)
HGB: 11.3 g/dL — ABNORMAL LOW (ref 13.0–18.0)
Lymphocyte #: 0.8 x10 3/mm — ABNORMAL LOW (ref 1.0–3.6)
Lymphocyte %: 14.3 %
MCH: 31.9 pg (ref 26.0–34.0)
MCHC: 32.7 g/dL (ref 32.0–36.0)
MCV: 98 fL (ref 80–100)
Monocyte #: 0.5 x10 3/mm (ref 0.2–1.0)
Monocyte %: 8.8 %
Neutrophil #: 4 x10 3/mm (ref 1.4–6.5)
Neutrophil %: 75.4 %
Platelet: 94 x10 3/mm — ABNORMAL LOW (ref 150–440)
RBC: 3.56 10*6/uL — ABNORMAL LOW (ref 4.40–5.90)
RDW: 15.9 % — ABNORMAL HIGH (ref 11.5–14.5)
WBC: 5.3 x10 3/mm (ref 3.8–10.6)

## 2014-09-05 LAB — FERRITIN: Ferritin (ARMC): 458 ng/mL — ABNORMAL HIGH (ref 8–388)

## 2014-09-05 LAB — IRON AND TIBC
Iron Bind.Cap.(Total): 269 ug/dL (ref 250–450)
Iron Saturation: 10 %
Iron: 27 ug/dL — ABNORMAL LOW (ref 65–175)
Unbound Iron-Bind.Cap.: 242 ug/dL

## 2014-09-09 ENCOUNTER — Other Ambulatory Visit (INDEPENDENT_AMBULATORY_CARE_PROVIDER_SITE_OTHER): Payer: Medicare Other

## 2014-09-09 ENCOUNTER — Ambulatory Visit (INDEPENDENT_AMBULATORY_CARE_PROVIDER_SITE_OTHER): Payer: Medicare Other | Admitting: *Deleted

## 2014-09-09 ENCOUNTER — Telehealth: Payer: Self-pay | Admitting: *Deleted

## 2014-09-09 DIAGNOSIS — E538 Deficiency of other specified B group vitamins: Secondary | ICD-10-CM

## 2014-09-09 DIAGNOSIS — D62 Acute posthemorrhagic anemia: Secondary | ICD-10-CM | POA: Diagnosis not present

## 2014-09-09 DIAGNOSIS — Z79899 Other long term (current) drug therapy: Secondary | ICD-10-CM

## 2014-09-09 LAB — BASIC METABOLIC PANEL
BUN: 14 mg/dL (ref 6–23)
CO2: 31 mEq/L (ref 19–32)
Calcium: 9 mg/dL (ref 8.4–10.5)
Chloride: 101 mEq/L (ref 96–112)
Creatinine, Ser: 1 mg/dL (ref 0.4–1.5)
GFR: 79.69 mL/min (ref >60.00)
Glucose, Bld: 99 mg/dL (ref 70–99)
Potassium: 3.5 mEq/L (ref 3.5–5.1)
Sodium: 138 mEq/L (ref 135–145)

## 2014-09-09 LAB — CBC WITH DIFFERENTIAL/PLATELET
Basophils Absolute: 0 10*3/uL (ref 0.0–0.1)
Basophils Relative: 0.2 % (ref 0.0–3.0)
Eosinophils Absolute: 0.1 10*3/uL (ref 0.0–0.7)
Eosinophils Relative: 1 % (ref 0.0–5.0)
HCT: 33.7 % — ABNORMAL LOW (ref 39.0–52.0)
Hemoglobin: 11 g/dL — ABNORMAL LOW (ref 13.0–17.0)
Lymphocytes Relative: 15.5 % (ref 12.0–46.0)
Lymphs Abs: 0.8 10*3/uL (ref 0.7–4.0)
MCHC: 32.7 g/dL (ref 30.0–36.0)
MCV: 97.4 fl (ref 78.0–100.0)
Monocytes Absolute: 0.4 10*3/uL (ref 0.1–1.0)
Monocytes Relative: 8 % (ref 3.0–12.0)
Neutro Abs: 4 10*3/uL (ref 1.4–7.7)
Neutrophils Relative %: 75.3 % (ref 43.0–77.0)
Platelets: 102 10*3/uL — ABNORMAL LOW (ref 150.0–400.0)
RBC: 3.46 Mil/uL — ABNORMAL LOW (ref 4.22–5.81)
RDW: 16.5 % — ABNORMAL HIGH (ref 11.5–15.5)
WBC: 5.3 10*3/uL (ref 4.0–10.5)

## 2014-09-09 MED ORDER — CYANOCOBALAMIN 1000 MCG/ML IJ SOLN
1000.0000 ug | Freq: Once | INTRAMUSCULAR | Status: AC
  Administered 2014-09-09: 1000 ug via INTRAMUSCULAR

## 2014-09-09 NOTE — Telephone Encounter (Signed)
No,  He is no longer on coumadin

## 2014-09-09 NOTE — Telephone Encounter (Signed)
Does pt need a Pt.inr? Collect the tube just in case

## 2014-09-10 ENCOUNTER — Encounter: Payer: Self-pay | Admitting: Internal Medicine

## 2014-09-10 ENCOUNTER — Other Ambulatory Visit: Payer: Self-pay | Admitting: Internal Medicine

## 2014-09-10 DIAGNOSIS — Z862 Personal history of diseases of the blood and blood-forming organs and certain disorders involving the immune mechanism: Secondary | ICD-10-CM

## 2014-09-10 DIAGNOSIS — Z79899 Other long term (current) drug therapy: Secondary | ICD-10-CM

## 2014-09-12 DIAGNOSIS — J449 Chronic obstructive pulmonary disease, unspecified: Secondary | ICD-10-CM | POA: Diagnosis not present

## 2014-10-02 DIAGNOSIS — R0609 Other forms of dyspnea: Secondary | ICD-10-CM | POA: Diagnosis not present

## 2014-10-02 DIAGNOSIS — I255 Ischemic cardiomyopathy: Secondary | ICD-10-CM | POA: Diagnosis not present

## 2014-10-02 DIAGNOSIS — G4733 Obstructive sleep apnea (adult) (pediatric): Secondary | ICD-10-CM | POA: Diagnosis not present

## 2014-10-02 DIAGNOSIS — R0989 Other specified symptoms and signs involving the circulatory and respiratory systems: Secondary | ICD-10-CM | POA: Diagnosis not present

## 2014-10-06 ENCOUNTER — Ambulatory Visit: Payer: Self-pay | Admitting: Oncology

## 2014-10-07 ENCOUNTER — Ambulatory Visit: Payer: Self-pay | Admitting: Specialist

## 2014-10-07 DIAGNOSIS — I2510 Atherosclerotic heart disease of native coronary artery without angina pectoris: Secondary | ICD-10-CM | POA: Diagnosis not present

## 2014-10-07 DIAGNOSIS — K8020 Calculus of gallbladder without cholecystitis without obstruction: Secondary | ICD-10-CM | POA: Diagnosis not present

## 2014-10-07 DIAGNOSIS — I10 Essential (primary) hypertension: Secondary | ICD-10-CM | POA: Diagnosis not present

## 2014-10-07 DIAGNOSIS — I517 Cardiomegaly: Secondary | ICD-10-CM | POA: Diagnosis not present

## 2014-10-07 DIAGNOSIS — E782 Mixed hyperlipidemia: Secondary | ICD-10-CM | POA: Diagnosis not present

## 2014-10-07 DIAGNOSIS — J90 Pleural effusion, not elsewhere classified: Secondary | ICD-10-CM | POA: Diagnosis not present

## 2014-10-07 DIAGNOSIS — I5033 Acute on chronic diastolic (congestive) heart failure: Secondary | ICD-10-CM | POA: Diagnosis not present

## 2014-10-07 DIAGNOSIS — I482 Chronic atrial fibrillation: Secondary | ICD-10-CM | POA: Diagnosis not present

## 2014-10-07 DIAGNOSIS — R188 Other ascites: Secondary | ICD-10-CM | POA: Diagnosis not present

## 2014-10-07 DIAGNOSIS — J449 Chronic obstructive pulmonary disease, unspecified: Secondary | ICD-10-CM | POA: Diagnosis not present

## 2014-10-07 DIAGNOSIS — J9811 Atelectasis: Secondary | ICD-10-CM | POA: Diagnosis not present

## 2014-10-08 ENCOUNTER — Encounter: Payer: Self-pay | Admitting: Internal Medicine

## 2014-10-08 ENCOUNTER — Ambulatory Visit: Payer: Self-pay | Admitting: Oncology

## 2014-10-08 DIAGNOSIS — R079 Chest pain, unspecified: Secondary | ICD-10-CM | POA: Diagnosis not present

## 2014-10-08 DIAGNOSIS — Z951 Presence of aortocoronary bypass graft: Secondary | ICD-10-CM | POA: Diagnosis not present

## 2014-10-08 DIAGNOSIS — R9439 Abnormal result of other cardiovascular function study: Secondary | ICD-10-CM | POA: Diagnosis not present

## 2014-10-08 DIAGNOSIS — I5022 Chronic systolic (congestive) heart failure: Secondary | ICD-10-CM | POA: Insufficient documentation

## 2014-10-08 DIAGNOSIS — G4733 Obstructive sleep apnea (adult) (pediatric): Secondary | ICD-10-CM | POA: Diagnosis not present

## 2014-10-08 DIAGNOSIS — I482 Chronic atrial fibrillation: Secondary | ICD-10-CM | POA: Diagnosis not present

## 2014-10-10 DIAGNOSIS — I5022 Chronic systolic (congestive) heart failure: Secondary | ICD-10-CM | POA: Diagnosis not present

## 2014-10-10 DIAGNOSIS — Z951 Presence of aortocoronary bypass graft: Secondary | ICD-10-CM | POA: Diagnosis not present

## 2014-10-15 ENCOUNTER — Other Ambulatory Visit: Payer: Self-pay | Admitting: Internal Medicine

## 2014-10-16 ENCOUNTER — Encounter: Payer: Self-pay | Admitting: Internal Medicine

## 2014-10-16 ENCOUNTER — Ambulatory Visit: Payer: Self-pay | Admitting: Internal Medicine

## 2014-10-16 ENCOUNTER — Ambulatory Visit (INDEPENDENT_AMBULATORY_CARE_PROVIDER_SITE_OTHER): Payer: Medicare Other | Admitting: Internal Medicine

## 2014-10-16 VITALS — BP 104/56 | HR 70 | Temp 97.8°F | Resp 18 | Ht 75.0 in | Wt 273.2 lb

## 2014-10-16 DIAGNOSIS — J90 Pleural effusion, not elsewhere classified: Secondary | ICD-10-CM

## 2014-10-16 DIAGNOSIS — I255 Ischemic cardiomyopathy: Secondary | ICD-10-CM | POA: Diagnosis not present

## 2014-10-16 DIAGNOSIS — D518 Other vitamin B12 deficiency anemias: Secondary | ICD-10-CM

## 2014-10-16 DIAGNOSIS — Z7901 Long term (current) use of anticoagulants: Secondary | ICD-10-CM

## 2014-10-16 DIAGNOSIS — G4733 Obstructive sleep apnea (adult) (pediatric): Secondary | ICD-10-CM

## 2014-10-16 DIAGNOSIS — I5022 Chronic systolic (congestive) heart failure: Secondary | ICD-10-CM | POA: Diagnosis not present

## 2014-10-16 DIAGNOSIS — Z951 Presence of aortocoronary bypass graft: Secondary | ICD-10-CM | POA: Diagnosis not present

## 2014-10-16 MED ORDER — CYANOCOBALAMIN 1000 MCG/ML IJ SOLN
1000.0000 ug | Freq: Once | INTRAMUSCULAR | Status: AC
  Administered 2014-10-16: 1000 ug via INTRAMUSCULAR

## 2014-10-16 NOTE — Progress Notes (Signed)
Patient ID: Rick Gilmore, male   DOB: [birth year], 73 y.o.   MRN: [id number]  Patient Active Problem List   Diagnosis Date Noted  . Pleural effusion, bilateral 10/17/2014  . Sprain of right knee/leg 06/12/2014  . Effusion of prepatellar bursa 06/12/2014  . Acute blood loss anemia 06/12/2014  . Acute bronchitis 12/27/2013  . Anemia 05/07/2013  . Dyspnea and respiratory abnormality 05/07/2013  . Long term current use of anticoagulant therapy 11/02/2011  . Pernicious anemia 11/02/2011  . Hx of adenomatous colonic polyps 11/02/2011  . Personal history of prostate cancer   . Ischemic cardiomyopathy   . Sleep apnea, obstructive     Subjective:  CC:   Chief Complaint  Patient presents with  . Acute Visit    SOB, started the first of October since the fall, patient has O2 at 2L min at home for night time use.  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    HPI:   Rick Gilmore is a 73 y.o. male who presents for  Follow up on chronic issues including ischemic cardiomyopathy, OSA, atrial fibrillation  and obesity  Since his last visit he was referred by his cardiologist to Dr Raul Del for evaluation of dyspnea.  Bilateral pleural effusions were found on chest  CT , moderate sized,  R > L  With R lung ATX.  No thoracentesis was ordered or planned .  He is now wearing supplemental oxygen at night .  The initiation of Imdur at night has made a considerablre improvement in his dyspnea,  EF is still  35%  By repeat ECHO,  Taking Eliquis for mitigation of CVA risk .     Past Medical History  Diagnosis Date  . CAD (coronary artery disease)   . Atrial fibrillation     on Coumadin  . Hypertension   . Erectile dysfunction   . Hyperlipidemia   . Ischemic cardiomyopathy     s/p AICD/pacer 2009, replaced 2010 [facility]  . Cancer 2001    Prostate, XRT and implant  . Tubular adenoma     colon polyp  . Personal history of prostate cancer 2001    s/p  XRT and implant ([facility])  . Sleep apnea, obstructive     uses CPAP nightly    Past Surgical History  Procedure Laterality Date  . Vasectomy    . Coronary artery bypass graft  1988       The following portions of the patient's history were reviewed and updated as appropriate: Allergies, current medications, and problem list.    Review of Systems:   Patient denies headache, fevers, malaise, unintentional weight loss, skin rash, eye pain, sinus congestion and sinus pain, sore throat, dysphagia,  hemoptysis , cough, dyspnea, wheezing, chest pain, palpitations, orthopnea, edema, abdominal pain, nausea, melena, diarrhea, constipation, flank pain, dysuria, hematuria, urinary  Frequency, nocturia, numbness, tingling, seizures,  Focal weakness, Loss of consciousness,  Tremor, insomnia, depression, anxiety, and suicidal ideation.     History   Social History  . Marital Status: Single    Spouse Name: [name]    Number of Children: [name]  . Years of Education: [name]   Occupational History  . Not on file.   Social History Main Topics  . Smoking status: Never Smoker   . Smokeless tobacco: Never Used  . Alcohol Use: 1.0 oz/week    2 drink(s) per week  . Drug Use: No  . Sexual Activity: Not on  file   Other Topics Concern  . Not on file   Social History Narrative    Objective:  Filed Vitals:   10/16/14 1453  BP: 104/56  Pulse: 70  Temp: 97.8 F (36.6 C)  Resp: 18     General appearance: alert, cooperative and appears stated age Ears: normal TM's and external ear canals both ears Throat: lips, mucosa, and tongue normal; teeth and gums normal Neck: no adenopathy, no carotid bruit, supple, symmetrical, trachea midline and thyroid not enlarged, symmetric, no tenderness/mass/nodules Back: symmetric, no curvature. ROM normal. No CVA tenderness. Lungs: clear to auscultation bilaterally Heart: regular rate and rhythm, S1, S2 normal, no murmur, click, rub or gallop Abdomen: soft,  non-tender; bowel sounds normal; no masses,  no organomegaly Pulses: 2+ and symmetric Skin: Skin color, texture, turgor normal. No rashes or lesions Lymph nodes: Cervical, supraclavicular, and axillary nodes normal.  Assessment and Plan:  Ischemic cardiomyopathy His weight has remained stable on current doses of Lasix,  His dyspnea has improved with addition of Imdur,  No changes today   Sleep apnea, obstructive Diagnosed by sleep study. he is wearing her CPAP every night a minimum of 6 hours per night and notes improved daytime wakefulness and decreased fatigue   Long term current use of anticoagulant therapy For PAF, stroke risk mitigation. Warfarin has been discontinued and Eliquis bid started.     Pleural effusion, bilateral Noted on chest CT done several weeks ago at [facility] secondary to persistent  dyspnea.  I have ordered a right lateral decubitus film for follow up which shows persistent bilateral effusions with free and loculated components.  Patient is not happy with current pulmonology follow up (Dr Dorna Mai has offered no plan except follow up in [place]) and is asking for second opinion .  Referral to [facility]. In progress    Updated Medication List Outpatient Encounter Prescriptions as of 10/16/2014  Medication Sig  . allopurinol (ZYLOPRIM) 300 MG tablet Take 1 tablet (300 mg total) by mouth daily.  Jearl Klinefelter ELLIPTA 62.5-25 MCG/INH AEPB Take 1 puff by mouth daily.  Marland Kitchen apixaban (ELIQUIS) 5 MG TABS tablet Take 1 tablet (5 mg total) by mouth 2 (two) times daily.  . colchicine 0.6 MG tablet Take 1 tablet (0.6 mg total) by mouth 2 (two) times daily.  . cyanocobalamin (,VITAMIN B-12,) 1000 MCG/ML injection Inject 1,000 mcg into the muscle every 30 (thirty) days.  Marland Kitchen escitalopram (LEXAPRO) 10 MG tablet TAKE 1 TABLET DAILY  . furosemide (LASIX) 80 MG tablet TAKE 1 TABLET DAILY  . iron polysaccharides (FERREX 150) 150 MG capsule Take 1 capsule (150 mg total) by mouth daily.  .  isosorbide mononitrate (IMDUR) 30 MG 24 hr tablet Take 30 mg by mouth daily.  Marland Kitchen lisinopril (PRINIVIL,ZESTRIL) 10 MG tablet Take 0.5 mg daily one half tablet  . metoprolol succinate (TOPROL-XL) 100 MG 24 hr tablet TAKE 1 TABLET DAILY  . NEXIUM 40 MG capsule TAKE 1 CAPSULE DAILY BEFORE BREAKFAST  . simvastatin (ZOCOR) 10 MG tablet TAKE 1 TABLET AT BEDTIME  . [DISCONTINUED] colchicine 0.6 MG tablet Take 0.6 mg by mouth as needed.  . [DISCONTINUED] doxazosin (CARDURA) 4 MG tablet TAKE 1 TABLET AT BEDTIME  . aspirin 81 MG tablet Take 81 mg by mouth daily.    . clindamycin (CLEOCIN) 150 MG capsule Take 300 mg by mouth 4 (four) times daily.  Marland Kitchen dextromethorphan (DELSYM) 30 MG/5ML liquid Take 2.5 mLs (15 mg total) by mouth as needed for cough.  Marland Kitchen  guaiFENesin-codeine 100-10 MG/5ML syrup Take 5 mLs by mouth 3 (three) times daily as needed.  . [EXPIRED] cyanocobalamin ((VITAMIN B-12)) injection 1,000 mcg      Orders Placed This Encounter  Procedures  . DG Chest Right Decubitus    Return in about 3 months (around 01/16/2015).

## 2014-10-16 NOTE — Patient Instructions (Signed)
I am referring you to Dr Stevenson Clinch for follow up on your dyspnea and pleural effusions  Your chest x ray today may show that the fluid (effusion) has resolved.

## 2014-10-16 NOTE — Progress Notes (Signed)
Pre-visit discussion using our clinic review tool. No additional management support is needed unless otherwise documented below in the visit note.  

## 2014-10-17 ENCOUNTER — Other Ambulatory Visit: Payer: Self-pay | Admitting: Internal Medicine

## 2014-10-17 DIAGNOSIS — J90 Pleural effusion, not elsewhere classified: Secondary | ICD-10-CM | POA: Insufficient documentation

## 2014-10-17 DIAGNOSIS — Z951 Presence of aortocoronary bypass graft: Secondary | ICD-10-CM | POA: Diagnosis not present

## 2014-10-17 DIAGNOSIS — I5022 Chronic systolic (congestive) heart failure: Secondary | ICD-10-CM | POA: Diagnosis not present

## 2014-10-17 NOTE — Assessment & Plan Note (Signed)
Noted on chest CT done several weeks ago at [facility] secondary to persistent  dyspnea.  I have ordered a right lateral decubitus film for follow up which shows persistent bilateral effusions with free and loculated components.  Patient is not happy with current pulmonology follow up (Dr Dorna Mai has offered no plan except follow up in [place]) and is asking for second opinion .  Referral to [facility]. In progress

## 2014-10-17 NOTE — Assessment & Plan Note (Signed)
Diagnosed by sleep study. he is wearing her CPAP every night a minimum of 6 hours per night and notes improved daytime wakefulness and decreased fatigue  

## 2014-10-17 NOTE — Assessment & Plan Note (Signed)
For PAF, stroke risk mitigation. Warfarin has been discontinued and Eliquis bid started.

## 2014-10-17 NOTE — Assessment & Plan Note (Signed)
His weight has remained stable on current doses of Lasix,  His dyspnea has improved with addition of Imdur,  No changes today

## 2014-10-21 DIAGNOSIS — I5022 Chronic systolic (congestive) heart failure: Secondary | ICD-10-CM | POA: Diagnosis not present

## 2014-10-21 DIAGNOSIS — Z951 Presence of aortocoronary bypass graft: Secondary | ICD-10-CM | POA: Diagnosis not present

## 2014-10-22 ENCOUNTER — Other Ambulatory Visit: Payer: Self-pay | Admitting: *Deleted

## 2014-10-22 MED ORDER — COLCHICINE 0.6 MG PO TABS: 0.6000 mg | ORAL_TABLET | Freq: Two times a day (BID) | ORAL | Status: DC

## 2014-10-23 DIAGNOSIS — Z951 Presence of aortocoronary bypass graft: Secondary | ICD-10-CM | POA: Diagnosis not present

## 2014-10-23 DIAGNOSIS — I5022 Chronic systolic (congestive) heart failure: Secondary | ICD-10-CM | POA: Diagnosis not present

## 2014-10-24 DIAGNOSIS — I5022 Chronic systolic (congestive) heart failure: Secondary | ICD-10-CM | POA: Diagnosis not present

## 2014-10-24 DIAGNOSIS — Z951 Presence of aortocoronary bypass graft: Secondary | ICD-10-CM | POA: Diagnosis not present

## 2014-10-28 ENCOUNTER — Institutional Professional Consult (permissible substitution): Admitting: Internal Medicine

## 2014-10-28 DIAGNOSIS — Z951 Presence of aortocoronary bypass graft: Secondary | ICD-10-CM | POA: Diagnosis not present

## 2014-10-28 DIAGNOSIS — I5022 Chronic systolic (congestive) heart failure: Secondary | ICD-10-CM | POA: Diagnosis not present

## 2014-10-29 ENCOUNTER — Ambulatory Visit (INDEPENDENT_AMBULATORY_CARE_PROVIDER_SITE_OTHER): Payer: Medicare Other | Admitting: Internal Medicine

## 2014-10-29 ENCOUNTER — Encounter: Payer: Self-pay | Admitting: Internal Medicine

## 2014-10-29 VITALS — BP 102/70 | HR 102 | Ht 75.0 in | Wt 269.1 lb

## 2014-10-29 DIAGNOSIS — J90 Pleural effusion, not elsewhere classified: Secondary | ICD-10-CM

## 2014-10-29 DIAGNOSIS — I255 Ischemic cardiomyopathy: Secondary | ICD-10-CM | POA: Diagnosis not present

## 2014-10-29 NOTE — Patient Instructions (Signed)
Please follow up as needed 

## 2014-10-29 NOTE — Progress Notes (Signed)
Date: 10/29/2014  MRN# [id number] Rick Gilmore 05/22/1941  Referring Physician:  Dr. Marylyn Ishihara Rick Gilmore is a 73 y.o. old male seen in consultation for bilateral pleural effusion and second opinion.  CC:  Chief Complaint  Patient presents with  . Advice Only    Referred by Dr. Derrel Nip Pleural Effusuion pt c/o sob denies cough,wheeze and chest tightness.    HPI:  Patient is a pleasant 73 year old male referred by Dr. Derrel Nip for second opinion about bilateral pleural effusion and shortness of breath. Brief history - history of ischemic cardiomyopathy, CHF NYFC III, OSA, BiV ICD, atrial fibrillation, Iron Def Anemia, Mild thrombocytopenia, prostate cancer (s\p seed implants and XRT) and obesity.  He was referred by his cardiologist to Dr. Raul Del [facility]) for evaluation of dyspnea.  Bilateral pleural effusions were found on chest CT, moderate sized,  R > L , with R lung atelectasis.  No thoracentesis was ordered or planned by Dr. Raul Del.  He is now wearing supplemental oxygen at night (2L).  The initiation of Imdur at night has made a considerable improvement in his dyspnea, EF is still  35%  by repeat ECHO, taking Eliquis for mitigation of CVA risk. Patient has no history of chest pain, pleurisy, calf pain or cough, no hemoptysis, no syncope, he doesn't a history of initial fibrillation with a decreased ejection fraction on anticoagulation. He also has a history of ulcerative sleep apnea, on CPAP, and is compliant with his CPAP machine. Review of records showed he saw Dr. Raul Del on 09/24/2014 for shortness of breath, Dr. Raul Del was working him up for possible pulmonary hypertension due to severely elevated RVSP (70.2) on an echo in July 2015, workup would include weight loss, continue CPAP, overnight oximetry, trial of anoro ellipta/albuterol, pulmonary rehabilitation, which strict salt and fluids, possible evaluation for right heart cardiac catheterization and follow-up in February  2016.  Patient was not satisfied with the above plan outlined by Dr. Raul Del, according to patient it was not clearly stated, and even though his shortness of breath was moderately improving, he did not understand why he was having bilateral pleural effusions. Currently he can walk about 30 yards, still has 1-2+ pitting edema, but wears compression stockings. Patient also follows with cardiology closely, [facility] (Dr. Nehemiah Massed).  He denies any fevers, chills, night sweats, rapid weight loss. PMHX:   Past Medical History  Diagnosis Date  . CAD (coronary artery disease)   . Atrial fibrillation     on Coumadin  . Hypertension   . Erectile dysfunction   . Hyperlipidemia   . Ischemic cardiomyopathy     s/p AICD/pacer 2009, replaced 2010 [facility]  . Cancer 2001    Prostate, XRT and implant  . Tubular adenoma     colon polyp  . Personal history of prostate cancer 2001    s/p XRT and implant ([facility])  . Sleep apnea, obstructive     uses CPAP nightly   Surgical Hx:  Past Surgical History  Procedure Laterality Date  . Vasectomy    . Coronary artery bypass graft  1988   Family Hx:  Family History  Problem Relation Age of Onset  . Hypertension Mother   . Hypertension Father   . Cancer Father     prostate, throat  . Emphysema Father   . Heart disease Maternal Grandmother   . Heart disease Paternal Grandmother    Social Hx:   History  Substance Use Topics  . Smoking status: Never Smoker   .  Smokeless tobacco: Never Used  . Alcohol Use: 1.0 oz/week    2 drink(s) per week   Medication:   Current Outpatient Rx  Name  Route  Sig  Dispense  Refill  . allopurinol (ZYLOPRIM) 300 MG tablet   Oral   Take 1 tablet (300 mg total) by mouth daily.   90 tablet   3   . ANORO ELLIPTA 62.5-25 MCG/INH AEPB   Oral   Take 1 puff by mouth daily.      0   . apixaban (ELIQUIS) 5 MG TABS tablet   Oral   Take 1 tablet (5 mg total) by mouth 2 (two) times daily.   180 tablet   2   .  aspirin 81 MG tablet   Oral   Take 81 mg by mouth daily.           . clindamycin (CLEOCIN) 150 MG capsule   Oral   Take 300 mg by mouth 4 (four) times daily.         . colchicine 0.6 MG tablet   Oral   Take 1 tablet (0.6 mg total) by mouth 2 (two) times daily.   180 tablet   0   . cyanocobalamin (,VITAMIN B-12,) 1000 MCG/ML injection   Intramuscular   Inject 1,000 mcg into the muscle every 30 (thirty) days.         Marland Kitchen dextromethorphan (DELSYM) 30 MG/5ML liquid   Oral   Take 2.5 mLs (15 mg total) by mouth as needed for cough.   89 mL   0   . doxazosin (CARDURA) 4 MG tablet      TAKE 1 TABLET AT BEDTIME   90 tablet   1   . escitalopram (LEXAPRO) 10 MG tablet      TAKE 1 TABLET DAILY   90 tablet   1   . furosemide (LASIX) 80 MG tablet      TAKE 1 TABLET DAILY   90 tablet   1   . guaiFENesin-codeine 100-10 MG/5ML syrup   Oral   Take 5 mLs by mouth 3 (three) times daily as needed.   120 mL   0   . iron polysaccharides (FERREX 150) 150 MG capsule   Oral   Take 1 capsule (150 mg total) by mouth daily.   90 capsule   2   . isosorbide mononitrate (IMDUR) 30 MG 24 hr tablet   Oral   Take 30 mg by mouth daily.      11   . lisinopril (PRINIVIL,ZESTRIL) 10 MG tablet      Take 0.5 mg daily one half tablet         . metoprolol succinate (TOPROL-XL) 100 MG 24 hr tablet      TAKE 1 TABLET DAILY   90 tablet   0   . NEXIUM 40 MG capsule      TAKE 1 CAPSULE DAILY BEFORE BREAKFAST   90 capsule   2   . simvastatin (ZOCOR) 10 MG tablet      TAKE 1 TABLET AT BEDTIME   90 tablet   1     NOTE DOSE REDUCTION WITH NEXT REFILL       Allergies:  Review of patient's allergies indicates no known allergies.  Review of Systems: Gen:  Denies  fever, sweats, chills HEENT: Denies blurred vision, double vision, ear pain, eye pain, hearing loss, nose bleeds, sore throat Cvc:  No dizziness, chest pain or heaviness Resp:   Admits to  mild to moderate sob  with exertion Gi: Denies swallowing difficulty, stomach pain, nausea or vomiting, diarrhea, constipation, bowel incontinence Gu:  Denies bladder incontinence, burning urine Ext:   No Joint pain Skin: No skin rash, easy bruising or bleeding or hives Endoc:  No polyuria, polydipsia , polyphagia or weight change Psych: No depression, insomnia or hallucinations  Other:  All other systems negative  Physical Examination:   VS: BP 102/70 mmHg  Pulse 102  Ht 6\' 3"  (1.905 m)  Wt 269 lb 1.6 oz (122.063 kg)  BMI 33.64 kg/m2  SpO2 94%  General Appearance: No distress  Neuro:without focal findings, mental status, speech normal, alert and oriented, cranial nerves 2-12 intact, reflexes normal and symmetric, sensation grossly normal  HEENT: PERRLA, EOM intact, no ptosis, no other lesions noticed; Mallampati 3 Pulmonary: good respiratory effort, dec BS at the bilateral bases to the mid lung, with mild crackles, no wheezing;   Sputum Production:  none CardiovascularNormal S1,S2.  No m/r/g.  Abdominal aorta pulsation normal.    Abdomen: Benign, Soft, non-tender, No masses, hepatosplenomegaly, No lymphadenopathy Renal:  No costovertebral tenderness  GU:  No performed at this time. Endoc: No evident thyromegaly, no signs of acromegaly or Cushing features Skin:   warm, no rashes, no ecchymosis  Extremities: normal, no cyanosis, no clubbing, warm with normal capillary refill, 1+ pitting edema. Other findings:none   Rad results: (The following images and results were reviewed by Dr. Stevenson Clinch).   CT Chest 10/07/14 COMPARISON:  Chest CT 05/19/2011.  FINDINGS: Mediastinum: Heart size is mildly enlarged. There is no significant pericardial fluid, thickening or pericardial calcification. There is atherosclerosis of the thoracic aorta, the great vessels of the mediastinum and the coronary arteries, including calcified atherosclerotic plaque in the left main, left anterior descending, left circumflex and right  coronary arteries. Status post median sternotomy for CABG. Left-sided all biventricular pacemaker/AICD with lead tips terminating in the right atrial appendage, right ventricular apex, and overlying the lateral wall of the left ventricle via the coronary sinus and coronary veins. No pathologically enlarged mediastinal or hilar lymph nodes. Please note that accurate exclusion of hilar adenopathy is limited on noncontrast CT scans. Esophagus is unremarkable in appearance.   Lungs/Pleura: Moderate bilateral pleural effusions layering dependently (right greater than left) with associated areas of passive atelectasis in the lower lobes of the lungs bilaterally. No definite consolidative airspace disease. No suspicious appearing pulmonary nodules or masses in the well aerated portions of the lungs.  Upper Abdomen: Large splenule incidentally noted. High attenuation material lying dependently in the gallbladder, presumably biliary sludge and/or small gallstones. Trace volume of perihepatic ascites. Musculoskeletal: Median sternotomy wires There are no aggressive appearing lytic or blastic lesions noted in the visualized portions of the skeleton.   IMPRESSION: 1. Cardiomegaly with moderate bilateral pleural effusions, and some associated passive atelectasis in the lower lobes of the lungs bilaterally. Although there is no overt pulmonary edema at this time, findings may be related to congestive heart failure. 2. Trace volume of perihepatic ascites. 3. Cholelithiasis or biliary sludge lying dependently in the gallbladder. 4. Atherosclerosis, including left main and 3 vessel coronary artery disease. Status post median sternotomy for CABG.  The following results were reviewed by Dr. Stevenson Clinch and discussed with the patient:  ECHO July 2015 Summary:  1. Left ventricular ejection fraction, by visual estimation, is 30 to  35%.  2. Moderately decreased global left ventricular systolic function.  3. Severely  increased left ventricular internal cavity size.  4. Right  ventricular volume overload.  5. Moderately reduced RV systolic function.  6. Severely enlarged right ventricle.  7. Severely dilated left atrium.  8. Severely dilated right atrium.  9. Severe tricuspid regurgitation. 10. Mild to moderate mitral valve regurgitation. 11. Severely elevated pulmonary artery systolic pressure. 12. Mildly increased left ventricular posterior wall thickness. 13. Inferior hypo. PHYSICIAN INTERPRETATION: Left Ventricle: The left ventricular internal cavity size was severely  increased. LV septal wall thickness was normal. LV posterior wall thickness was mildly increased. Global LV systolic function was moderately decreased. Left ventricular ejection fraction, by visual estimation, is 30 to 35%. The interventricular septum is flattened in diastole ('D' shaped left ventricle), consistent with right ventricular volume overload. Right Ventricle: The right ventricular size is severely enlarged. Global  RV systolic function is moderately reduced. Left Atrium: The left atrium is severely dilated. Right Atrium: The right atrium is severely dilated. Pericardium: There is no evidence of pericardial effusion. Mitral Valve: The mitral valve is normal in structure. Mild to moderate mitral valve regurgitation is seen. Tricuspid Valve: Severe tricuspid regurgitation is visualized. The  tricuspid regurgitant velocity is 3.88 m/s, and with an assumed right atrial pressure of 10 mmHg, the estimated right ventricular systolic pressure is severely elevated at 70.2 mmHg. Aortic Valve: The aortic valve is normal. No evidence of aortic valve regurgitation is seen. Pulmonic Valve: The pulmonic valve is normal. Additional Comments: A pacer wire is visualized. Nuclear Medicine Study 10/08/14 FINDINGS:   Regional wall motion:  demonstrates  akinesis of the ANTERIOR APICAL AND    LATERAL MYOCARDIAL WALLS.  The overall quality of the  study is good.    Artifacts noted: no   Left ventricular cavity: enlarged.    Perfusion Analysis:  SPECT images demonstrate large perfusion abnormality   of severe intensity is present in  the  distal anterior apical and lateral   myocardial  region on the stress images.  As well as images at rest consistent with previous myocardial infarction and no current evidence of myocardial ischemia   EKG:     Other:   Assessment and Plan: No problem-specific assessment & plan notes found for this encounter.   Updated Medication List Outpatient Encounter Prescriptions as of 10/29/2014  Medication Sig  . allopurinol (ZYLOPRIM) 300 MG tablet Take 1 tablet (300 mg total) by mouth daily.  Jearl Klinefelter ELLIPTA 62.5-25 MCG/INH AEPB Take 1 puff by mouth daily.  Marland Kitchen apixaban (ELIQUIS) 5 MG TABS tablet Take 1 tablet (5 mg total) by mouth 2 (two) times daily.  Marland Kitchen aspirin 81 MG tablet Take 81 mg by mouth daily.    . clindamycin (CLEOCIN) 150 MG capsule Take 300 mg by mouth 4 (four) times daily.  . colchicine 0.6 MG tablet Take 1 tablet (0.6 mg total) by mouth 2 (two) times daily.  . cyanocobalamin (,VITAMIN B-12,) 1000 MCG/ML injection Inject 1,000 mcg into the muscle every 30 (thirty) days.  Marland Kitchen dextromethorphan (DELSYM) 30 MG/5ML liquid Take 2.5 mLs (15 mg total) by mouth as needed for cough.  . doxazosin (CARDURA) 4 MG tablet TAKE 1 TABLET AT BEDTIME  . escitalopram (LEXAPRO) 10 MG tablet TAKE 1 TABLET DAILY  . furosemide (LASIX) 80 MG tablet TAKE 1 TABLET DAILY  . guaiFENesin-codeine 100-10 MG/5ML syrup Take 5 mLs by mouth 3 (three) times daily as needed.  . iron polysaccharides (FERREX 150) 150 MG capsule Take 1 capsule (150 mg total) by mouth daily.  . isosorbide mononitrate (IMDUR) 30 MG 24 hr tablet Take 30 mg  by mouth daily.  Marland Kitchen lisinopril (PRINIVIL,ZESTRIL) 10 MG tablet Take 0.5 mg daily one half tablet  . metoprolol succinate (TOPROL-XL) 100 MG 24 hr tablet TAKE 1 TABLET DAILY  . NEXIUM 40 MG capsule TAKE  1 CAPSULE DAILY BEFORE BREAKFAST  . simvastatin (ZOCOR) 10 MG tablet TAKE 1 TABLET AT BEDTIME    Orders for this visit: No orders of the defined types were placed in this encounter.     Thank  you for the consultation and for allowing [facility] to assist in the care of your patient. Our recommendations are noted above.  Please contact us if we can be of further service.   Vilinda Boehringer, MD [facility] Office Number: [phone]

## 2014-10-30 DIAGNOSIS — I5022 Chronic systolic (congestive) heart failure: Secondary | ICD-10-CM | POA: Diagnosis not present

## 2014-10-30 DIAGNOSIS — I10 Essential (primary) hypertension: Secondary | ICD-10-CM | POA: Diagnosis not present

## 2014-10-30 DIAGNOSIS — I25709 Atherosclerosis of coronary artery bypass graft(s), unspecified, with unspecified angina pectoris: Secondary | ICD-10-CM | POA: Diagnosis not present

## 2014-10-30 DIAGNOSIS — E782 Mixed hyperlipidemia: Secondary | ICD-10-CM | POA: Diagnosis not present

## 2014-10-30 DIAGNOSIS — Z951 Presence of aortocoronary bypass graft: Secondary | ICD-10-CM | POA: Diagnosis not present

## 2014-11-04 DIAGNOSIS — Z951 Presence of aortocoronary bypass graft: Secondary | ICD-10-CM | POA: Diagnosis not present

## 2014-11-04 DIAGNOSIS — I5022 Chronic systolic (congestive) heart failure: Secondary | ICD-10-CM | POA: Diagnosis not present

## 2014-11-05 ENCOUNTER — Encounter: Payer: Self-pay | Admitting: Internal Medicine

## 2014-11-05 DIAGNOSIS — Z981 Arthrodesis status: Secondary | ICD-10-CM | POA: Diagnosis not present

## 2014-11-05 DIAGNOSIS — I5022 Chronic systolic (congestive) heart failure: Secondary | ICD-10-CM | POA: Diagnosis not present

## 2014-11-05 NOTE — Assessment & Plan Note (Signed)
Multifactorial - CHF, OSA, Cardiomyopathy, poor EF, Afib, elevated RSVP, possible PAH or pHTN  Discussed in detail pathophysiology of these effusions and the multifactorial etiologies associated with them.  Explained to patient in great length and detail that these effusions are most likely related to his severe heart disease, poor ejection fraction, and possible pulmonary hypertension. Pulmonary hypertension and its subclasses were also discussed with the patient including pulmonary arterial hypertension. Explain reasons for not performing a thoracentesis; given his poor cardiac function, and this effusion most is likely transudative related to CHF; draining the effusion will create negative pressure and reoccurrence of the effusion. While a thoracocentesis may provide temporary relief, it is not curative, and likelihood of effusion recurring and becoming more massive is increased with thoracentesis. Reviewed his cardiac echo, his CAT scan of chest with the patient, explained elevated pressures on echo and concern for pulmonary hypertension. Explained and reviewed Dr. Leane Platt note of the patient's bilateral pulmonary effusion and elevated RVSP, and appropriate plan by Dr. Raul Del. At this time patient is in agreement with no further workup for effusions unless he becomes moderately or severely symptomatic. I have spoken to him about Dr. Leane Platt plan for reimaging and possible right cardiac catheterization to evaluate for pulmonary arterial hypertension and subsequent therapies; recommended that patient continue follow-up with Dr. Raul Del for continuity of care and appropriate plan of care by Dr. Raul Del. Patient is in agreement with this and will follow-up with Dr. Stevenson Clinch as needed.

## 2014-11-15 ENCOUNTER — Other Ambulatory Visit: Payer: Self-pay | Admitting: Internal Medicine

## 2014-11-20 ENCOUNTER — Inpatient Hospital Stay: Payer: Self-pay | Admitting: Internal Medicine

## 2014-11-20 DIAGNOSIS — Z8546 Personal history of malignant neoplasm of prostate: Secondary | ICD-10-CM | POA: Diagnosis not present

## 2014-11-20 DIAGNOSIS — Z951 Presence of aortocoronary bypass graft: Secondary | ICD-10-CM | POA: Diagnosis not present

## 2014-11-20 DIAGNOSIS — J9601 Acute respiratory failure with hypoxia: Secondary | ICD-10-CM | POA: Diagnosis not present

## 2014-11-20 DIAGNOSIS — I2510 Atherosclerotic heart disease of native coronary artery without angina pectoris: Secondary | ICD-10-CM | POA: Diagnosis present

## 2014-11-20 DIAGNOSIS — J90 Pleural effusion, not elsewhere classified: Secondary | ICD-10-CM | POA: Diagnosis not present

## 2014-11-20 DIAGNOSIS — I5023 Acute on chronic systolic (congestive) heart failure: Secondary | ICD-10-CM | POA: Diagnosis not present

## 2014-11-20 DIAGNOSIS — J189 Pneumonia, unspecified organism: Secondary | ICD-10-CM | POA: Diagnosis not present

## 2014-11-20 DIAGNOSIS — Z95810 Presence of automatic (implantable) cardiac defibrillator: Secondary | ICD-10-CM | POA: Diagnosis not present

## 2014-11-20 DIAGNOSIS — I509 Heart failure, unspecified: Secondary | ICD-10-CM | POA: Diagnosis not present

## 2014-11-20 DIAGNOSIS — E876 Hypokalemia: Secondary | ICD-10-CM | POA: Diagnosis not present

## 2014-11-20 DIAGNOSIS — J810 Acute pulmonary edema: Secondary | ICD-10-CM | POA: Diagnosis not present

## 2014-11-20 DIAGNOSIS — Z7901 Long term (current) use of anticoagulants: Secondary | ICD-10-CM | POA: Diagnosis not present

## 2014-11-20 DIAGNOSIS — I10 Essential (primary) hypertension: Secondary | ICD-10-CM | POA: Diagnosis not present

## 2014-11-20 DIAGNOSIS — I4891 Unspecified atrial fibrillation: Secondary | ICD-10-CM | POA: Diagnosis present

## 2014-11-20 DIAGNOSIS — G4733 Obstructive sleep apnea (adult) (pediatric): Secondary | ICD-10-CM | POA: Diagnosis not present

## 2014-11-20 DIAGNOSIS — Z66 Do not resuscitate: Secondary | ICD-10-CM | POA: Diagnosis not present

## 2014-11-20 DIAGNOSIS — R0602 Shortness of breath: Secondary | ICD-10-CM | POA: Diagnosis not present

## 2014-11-20 LAB — BASIC METABOLIC PANEL
Anion Gap: 9 (ref 7–16)
BUN: 17 mg/dL (ref 7–18)
Calcium, Total: 8.7 mg/dL (ref 8.5–10.1)
Chloride: 100 mmol/L (ref 98–107)
Co2: 29 mmol/L (ref 21–32)
Creatinine: 1.15 mg/dL (ref 0.60–1.30)
EGFR (African American): >60
EGFR (Non-African Amer.): >60
Glucose: 118 mg/dL — ABNORMAL HIGH (ref 65–99)
Osmolality: 278 (ref 275–301)
Potassium: 3.3 mmol/L — ABNORMAL LOW (ref 3.5–5.1)
Sodium: 138 mmol/L (ref 136–145)

## 2014-11-20 LAB — CBC
HCT: 33 % — ABNORMAL LOW (ref 40.0–52.0)
HGB: 10.8 g/dL — ABNORMAL LOW (ref 13.0–18.0)
MCH: 31.4 pg (ref 26.0–34.0)
MCHC: 32.6 g/dL (ref 32.0–36.0)
MCV: 96 fL (ref 80–100)
Platelet: 83 10*3/uL — ABNORMAL LOW (ref 150–440)
RBC: 3.43 10*6/uL — ABNORMAL LOW (ref 4.40–5.90)
RDW: 17.4 % — ABNORMAL HIGH (ref 11.5–14.5)
WBC: 4.9 10*3/uL (ref 3.8–10.6)

## 2014-11-20 LAB — PRO B NATRIURETIC PEPTIDE: B-Type Natriuretic Peptide: 10968 pg/mL — ABNORMAL HIGH (ref 0–125)

## 2014-11-20 LAB — TROPONIN I
Troponin-I: 0.03 ng/mL
Troponin-I: 0.04 ng/mL

## 2014-11-20 LAB — MAGNESIUM: Magnesium: 1.9 mg/dL

## 2014-11-21 LAB — BASIC METABOLIC PANEL
Anion Gap: 6 — ABNORMAL LOW (ref 7–16)
BUN: 17 mg/dL (ref 7–18)
Calcium, Total: 8.3 mg/dL — ABNORMAL LOW (ref 8.5–10.1)
Chloride: 99 mmol/L (ref 98–107)
Co2: 31 mmol/L (ref 21–32)
Creatinine: 1.17 mg/dL (ref 0.60–1.30)
EGFR (African American): >60
EGFR (Non-African Amer.): >60
Glucose: 109 mg/dL — ABNORMAL HIGH (ref 65–99)
Osmolality: 274 (ref 275–301)
Potassium: 3 mmol/L — ABNORMAL LOW (ref 3.5–5.1)
Sodium: 136 mmol/L (ref 136–145)

## 2014-11-21 LAB — CBC WITH DIFFERENTIAL/PLATELET
Basophil #: 0 10*3/uL (ref 0.0–0.1)
Basophil %: 0.5 %
Eosinophil #: 0 10*3/uL (ref 0.0–0.7)
Eosinophil %: 0.5 %
HCT: 30.4 % — ABNORMAL LOW (ref 40.0–52.0)
HGB: 9.9 g/dL — ABNORMAL LOW (ref 13.0–18.0)
Lymphocyte #: 0.6 10*3/uL — ABNORMAL LOW (ref 1.0–3.6)
Lymphocyte %: 17.9 %
MCH: 31.7 pg (ref 26.0–34.0)
MCHC: 32.5 g/dL (ref 32.0–36.0)
MCV: 98 fL (ref 80–100)
Monocyte #: 0.5 x10 3/mm (ref 0.2–1.0)
Monocyte %: 13.4 %
Neutrophil #: 2.4 10*3/uL (ref 1.4–6.5)
Neutrophil %: 67.7 %
Platelet: 71 10*3/uL — ABNORMAL LOW (ref 150–440)
RBC: 3.11 10*6/uL — ABNORMAL LOW (ref 4.40–5.90)
RDW: 17.6 % — ABNORMAL HIGH (ref 11.5–14.5)
WBC: 3.6 10*3/uL — ABNORMAL LOW (ref 3.8–10.6)

## 2014-11-21 LAB — CBC AND DIFFERENTIAL
HCT: 30 % — AB (ref 41–53)
Hemoglobin: 9.9 g/dL — AB (ref 13.5–17.5)
Platelets: 71 10*3/uL — AB (ref 150–399)
WBC: 3.6 10^3/mL

## 2014-11-21 LAB — HEMOGLOBIN A1C: Hemoglobin A1C: 5.9 % (ref 4.2–6.3)

## 2014-11-21 LAB — LIPID PANEL
Cholesterol: 79 mg/dL (ref 0–200)
HDL Cholesterol: 22 mg/dL — ABNORMAL LOW (ref 40–60)
Ldl Cholesterol, Calc: 43 mg/dL (ref 0–100)
Triglycerides: 72 mg/dL (ref 0–200)
VLDL Cholesterol, Calc: 14 mg/dL (ref 5–40)

## 2014-11-21 LAB — MAGNESIUM: Magnesium: 1.7 mg/dL — ABNORMAL LOW

## 2014-11-22 LAB — MAGNESIUM: Magnesium: 1.8 mg/dL

## 2014-11-22 LAB — POTASSIUM: Potassium: 3.7 mmol/L (ref 3.5–5.1)

## 2014-11-23 LAB — POTASSIUM: Potassium: 3.4 mmol/L — ABNORMAL LOW (ref 3.5–5.1)

## 2014-11-23 LAB — LIPID PANEL
Cholesterol: 79 mg/dL (ref 0–200)
HDL: 22 mg/dL — AB (ref 35–70)
LDL Cholesterol: 43 mg/dL
Triglycerides: 72 mg/dL (ref 40–160)

## 2014-11-23 LAB — BASIC METABOLIC PANEL
BUN: 17 mg/dL (ref 4–21)
Creatinine: 1.2 mg/dL (ref 0.6–1.3)
Glucose: 109 mg/dL
Potassium: 3 mmol/L — AB (ref 3.4–5.3)

## 2014-11-23 LAB — CBC AND DIFFERENTIAL
HCT: 30 % — AB (ref 41–53)
HCT: 33 % — AB (ref 41–53)
Hemoglobin: 10.8 g/dL — AB (ref 13.5–17.5)
Hemoglobin: 9.9 g/dL — AB (ref 13.5–17.5)
Platelets: 83 10*3/uL — AB (ref 150–399)
WBC: 3.6 10^3/mL
WBC: 4.9 10^3/mL

## 2014-11-23 LAB — CREATININE, SERUM
Creatinine: 0.91 mg/dL (ref 0.60–1.30)
EGFR (African American): >60
EGFR (Non-African Amer.): >60

## 2014-11-23 LAB — MAGNESIUM: Magnesium: 1.7 mg/dL — ABNORMAL LOW

## 2014-11-23 LAB — HEMOGLOBIN A1C: Hgb A1c MFr Bld: 5.9 % (ref 4.0–6.0)

## 2014-11-25 ENCOUNTER — Telehealth: Payer: Self-pay | Admitting: Internal Medicine

## 2014-11-25 DIAGNOSIS — I5023 Acute on chronic systolic (congestive) heart failure: Secondary | ICD-10-CM | POA: Diagnosis not present

## 2014-11-25 DIAGNOSIS — Z95810 Presence of automatic (implantable) cardiac defibrillator: Secondary | ICD-10-CM | POA: Diagnosis not present

## 2014-11-25 DIAGNOSIS — Z9181 History of falling: Secondary | ICD-10-CM | POA: Diagnosis not present

## 2014-11-25 DIAGNOSIS — I4891 Unspecified atrial fibrillation: Secondary | ICD-10-CM | POA: Diagnosis not present

## 2014-11-25 DIAGNOSIS — I10 Essential (primary) hypertension: Secondary | ICD-10-CM | POA: Diagnosis not present

## 2014-11-25 DIAGNOSIS — Z7901 Long term (current) use of anticoagulants: Secondary | ICD-10-CM | POA: Diagnosis not present

## 2014-11-25 DIAGNOSIS — I2510 Atherosclerotic heart disease of native coronary artery without angina pectoris: Secondary | ICD-10-CM | POA: Diagnosis not present

## 2014-11-25 DIAGNOSIS — Z951 Presence of aortocoronary bypass graft: Secondary | ICD-10-CM | POA: Diagnosis not present

## 2014-11-25 DIAGNOSIS — I259 Chronic ischemic heart disease, unspecified: Secondary | ICD-10-CM | POA: Diagnosis not present

## 2014-11-25 DIAGNOSIS — Z8546 Personal history of malignant neoplasm of prostate: Secondary | ICD-10-CM | POA: Diagnosis not present

## 2014-11-25 LAB — CULTURE, BLOOD (SINGLE)

## 2014-11-25 NOTE — Telephone Encounter (Signed)
Add patient to Dr. Lupita Dawn schedule 11/28/14 at 11:45am

## 2014-11-25 NOTE — Telephone Encounter (Signed)
Patient needs to be added to your schedule for a hospital follow-up. D/Ced on 11/23/14. Please advise Melissa where to add patient.

## 2014-11-25 NOTE — Telephone Encounter (Signed)
Pt left message stated that he was hospitalized and d/c on 12/19. Pt was called to find out dx, but no answer. Please advise where to add to schedule.msn

## 2014-11-25 NOTE — Telephone Encounter (Signed)
Dec 24 th at 11:45

## 2014-11-28 ENCOUNTER — Ambulatory Visit (INDEPENDENT_AMBULATORY_CARE_PROVIDER_SITE_OTHER): Payer: Medicare Other | Admitting: Internal Medicine

## 2014-11-28 ENCOUNTER — Encounter: Payer: Self-pay | Admitting: Internal Medicine

## 2014-11-28 VITALS — BP 96/58 | HR 64 | Temp 98.2°F | Resp 16 | Wt 259.5 lb

## 2014-11-28 DIAGNOSIS — D696 Thrombocytopenia, unspecified: Secondary | ICD-10-CM

## 2014-11-28 DIAGNOSIS — E538 Deficiency of other specified B group vitamins: Secondary | ICD-10-CM

## 2014-11-28 DIAGNOSIS — E876 Hypokalemia: Secondary | ICD-10-CM

## 2014-11-28 DIAGNOSIS — I259 Chronic ischemic heart disease, unspecified: Secondary | ICD-10-CM | POA: Diagnosis not present

## 2014-11-28 DIAGNOSIS — D500 Iron deficiency anemia secondary to blood loss (chronic): Secondary | ICD-10-CM

## 2014-11-28 DIAGNOSIS — E785 Hyperlipidemia, unspecified: Secondary | ICD-10-CM | POA: Diagnosis not present

## 2014-11-28 DIAGNOSIS — I4891 Unspecified atrial fibrillation: Secondary | ICD-10-CM | POA: Diagnosis not present

## 2014-11-28 DIAGNOSIS — I255 Ischemic cardiomyopathy: Secondary | ICD-10-CM

## 2014-11-28 DIAGNOSIS — I5023 Acute on chronic systolic (congestive) heart failure: Secondary | ICD-10-CM | POA: Diagnosis not present

## 2014-11-28 DIAGNOSIS — I10 Essential (primary) hypertension: Secondary | ICD-10-CM | POA: Diagnosis not present

## 2014-11-28 DIAGNOSIS — Z7901 Long term (current) use of anticoagulants: Secondary | ICD-10-CM

## 2014-11-28 DIAGNOSIS — I2510 Atherosclerotic heart disease of native coronary artery without angina pectoris: Secondary | ICD-10-CM | POA: Diagnosis not present

## 2014-11-28 LAB — BASIC METABOLIC PANEL
BUN: 10 mg/dL (ref 6–23)
CO2: 29 mEq/L (ref 19–32)
Calcium: 9 mg/dL (ref 8.4–10.5)
Chloride: 102 mEq/L (ref 96–112)
Creat: 0.84 mg/dL (ref 0.50–1.35)
Glucose, Bld: 92 mg/dL (ref 70–99)
Potassium: 4.4 mEq/L (ref 3.5–5.3)
Sodium: 138 mEq/L (ref 135–145)

## 2014-11-28 LAB — MAGNESIUM: Magnesium: 2.1 mg/dL (ref 1.5–2.5)

## 2014-11-28 MED ORDER — MAGNESIUM OXIDE 400 MG PO TABS: 400.0000 mg | ORAL_TABLET | Freq: Every day | ORAL | Status: DC

## 2014-11-28 MED ORDER — POTASSIUM CHLORIDE CRYS ER 20 MEQ PO TBCR: 20.0000 meq | EXTENDED_RELEASE_TABLET | Freq: Every day | ORAL | Status: DC

## 2014-11-28 MED ORDER — SPIRONOLACTONE 25 MG PO TABS: 25.0000 mg | ORAL_TABLET | Freq: Every day | ORAL | Status: DC

## 2014-11-28 MED ORDER — CYANOCOBALAMIN 1000 MCG/ML IJ SOLN
1000.0000 ug | Freq: Once | INTRAMUSCULAR | Status: AC
  Administered 2014-11-28: 1000 ug via INTRAMUSCULAR

## 2014-11-28 MED ORDER — ALBUTEROL SULFATE HFA 108 (90 BASE) MCG/ACT IN AERS: 2.0000 | INHALATION_SPRAY | Freq: Four times a day (QID) | RESPIRATORY_TRACT | Status: DC | PRN

## 2014-11-28 NOTE — Patient Instructions (Addendum)
Decrease the lisinopril dose to 2.5 mg twice daily instead of 5 mg once daily   Continue 80 mg daily of lasix and 25 mg spironolactone daily for now and continue to monitor your weight daily  Return  Next week for a BP checl

## 2014-11-28 NOTE — Progress Notes (Signed)
Patient ID: Rick Gilmore, male   DOB: 05-29-1941, 73 y.o.   MRN: [id number]   Patient Active Problem List   Diagnosis Date Noted  . Hypomagnesemia 12/01/2014  . Hypokalemia 12/01/2014  . Thrombocytopenia 12/01/2014  . Pleural effusion, bilateral 10/17/2014  . Sprain of right knee/leg 06/12/2014  . Iron deficiency anemia due to chronic blood loss 06/12/2014  . Acute bronchitis 12/27/2013  . Anemia 05/07/2013  . Dyspnea and respiratory abnormality 05/07/2013  . Long term current use of anticoagulant therapy 11/02/2011  . Pernicious anemia 11/02/2011  . Hx of adenomatous colonic polyps 11/02/2011  . Personal history of prostate cancer   . Ischemic cardiomyopathy   . Sleep apnea, obstructive     Subjective:  CC:   Chief Complaint  Patient presents with  . Hospitalization Follow-up    SOB     HPI:   Rick Gilmore is a 73 y.o. male who presents for   Hospital follow up.  Patient was admitted to [facility] on Dec 16 with respiratory failure /hypoxiemia and dc'd on Dec 19th by Dr Bridgett Larsson after two days of aggressive diuresis with lasix q 8 hours was effective in reducing his hypoxia and pulmonary edema. He was also treated forhypokalemia and hypomagnesemia and discharged home on spironolactone and an ACE inhibitor. Labs and x rays from hospitaliation were available and reviewed with patient today. His weight has been stable since discharge and he is weighing himself daily. He denies orthopnea and dyspnea at rest.    Past Medical History  Diagnosis Date  . CAD (coronary artery disease)   . Atrial fibrillation     on Coumadin  . Hypertension   . Erectile dysfunction   . Hyperlipidemia   . Ischemic cardiomyopathy     s/p AICD/pacer 2009, replaced 2010 [facility]  . Cancer 2001    Prostate, XRT and implant  . Tubular adenoma     colon polyp  . Personal history of prostate cancer 2001    s/p XRT and implant ([facility])  . Sleep apnea, obstructive     uses CPAP nightly    Past Surgical History   Procedure Laterality Date  . Vasectomy    . Coronary artery bypass graft  1988       The following portions of the patient's history were reviewed and updated as appropriate: Allergies, current medications, and problem list.    Review of Systems:   Patient denies headache, fevers, malaise, unintentional weight loss, skin rash, eye pain, sinus congestion and sinus pain, sore throat, dysphagia,  hemoptysis , cough, dyspnea, wheezing, chest pain, palpitations, orthopnea, edema, abdominal pain, nausea, melena, diarrhea, constipation, flank pain, dysuria, hematuria, urinary  Frequency, nocturia, numbness, tingling, seizures,  Focal weakness, Loss of consciousness,  Tremor, insomnia, depression, anxiety, and suicidal ideation.     History   Social History  . Marital Status: Single    Spouse Name: [name]    Number of Children: [name]  . Years of Education: [name]   Occupational History  . Not on file.   Social History Main Topics  . Smoking status: Never Smoker   . Smokeless tobacco: Never Used  . Alcohol Use: 1.0 oz/week    2 drink(s) per week  . Drug Use: No  . Sexual Activity: Not on file   Other Topics Concern  . Not on file   Social History Narrative    Objective:  Filed Vitals:   11/28/14 1155  BP: 96/58  Pulse: 64  Temp: 98.2 F (36.8 C)  Resp: 16     General appearance: alert, cooperative and appears stated age Ears: normal TM's and external ear canals both ears Throat: lips, mucosa, and tongue normal; teeth and gums normal Neck: no adenopathy, no carotid bruit, supple, symmetrical, trachea midline and thyroid not enlarged, symmetric, no tenderness/mass/nodules Back: symmetric, no curvature. ROM normal. No CVA tenderness. Lungs: clear to auscultation bilaterally Heart: regular rate and rhythm, S1, S2 normal, no murmur, click, rub or gallop Abdomen: soft, non-tender; bowel sounds normal; no masses,  no organomegaly Pulses: 2+ and symmetric Skin: Skin color,  texture, turgor normal. No rashes or lesions Lymph nodes: Cervical, supraclavicular, and axillary nodes normal.  Assessment and Plan:  Problem List Items Addressed This Visit    Hyperlipidemia LDL goal <70    His lipids are much lower than target on low dose simvastatin.  Given his concurrent anemia and thrombocytopenia, will recommend  suspension of simvastatin.      Relevant Medications      spironolactone (ALDACTONE) tablet   Hypokalemia - Primary    He was treated for hypokalemia and discharged homd  on supplements, spironolactone and and ARB.  Repeat potassium level  Has normalized .  Continue current regimen  Lab Results  Component Value Date   NA 138 11/28/2014   K 4.4 11/28/2014   CL 102 11/28/2014   CO2 29 11/28/2014       Relevant Orders      Magnesium (Completed)      Basic metabolic panel (Completed)   Hypomagnesemia    He was treated for  Low mg with infusions and discharged home on  oral supplementation.  Repeat level is pending      Iron deficiency anemia due to chronic blood loss    Initially Secondary to large traumatic hematoma of knee in July resulting in symptomatic anemia requiring transfusion of 2 units durig July hospitalization .Howeve, he has  been taking iron supplements once daily ,since then and his hgb dropped with 9.9 during December hospitalization despite aggressive diuresis.  He takes eliquis for embolic stroke prevention and now has thrombocytopenia as well. Will need hematology evaluation and  GI evaluation to determine the overall benefit of continued anticoagulation.     Relevant Medications      cyanocobalamin ((VITAMIN B-12)) injection 1,000 mcg (Completed)   Other Relevant Orders      Ambulatory referral to Hematology      CBC with Differential      POC Hemoccult Bld/Stl (3-Cd Home Screen)   Ischemic cardiomyopathy    With recent admission for pulmonary edema leading to respiratory distress.  Patient appears stable currently,  But his  BPs are soft,   Will divide his lisinopril dose to 2,. mg bid.  Repeat labs to monitor his mg and, cr and  potassium are due     Relevant Medications      spironolactone (ALDACTONE) tablet   Long term current use of anticoagulant therapy    His risk of embolic stroke was previously managed with coumadin until he had a large traumatic hematoma to knee requiring transfusion. He now takes Eliquis, but his worsening anemia and worsening thrombocytopenia need further evaluation     Relevant Orders      Ambulatory referral to Hematology   Thrombocytopenia    His new onset thrombocytopenia was not addressed during December hospitalization , nor was his drop in hgb from 10.8 to 9.9 , which is concerning since he was diuresed aggressively. .  Will refer back to  Dr Grayland Ormond for repeat evaluation (June 2014 for same, no BM aspiration done)    Relevant Orders      Ambulatory referral to Hematology    Other Visit Diagnoses    B12 deficiency        Relevant Medications       cyanocobalamin ((VITAMIN B-12)) injection 1,000 mcg (Completed)

## 2014-11-28 NOTE — Progress Notes (Signed)
Pre visit review using our clinic review tool, if applicable. No additional management support is needed unless otherwise documented below in the visit note. 

## 2014-11-30 ENCOUNTER — Encounter: Payer: Self-pay | Admitting: Internal Medicine

## 2014-11-30 NOTE — Assessment & Plan Note (Signed)
With recent admission for pulmonary edema leading to respiratory distress.  Patient appears stable currently,  But his BPs are soft,   Will divide his lisinopril dose to 2,. mg bid.  Repeat labs to monitor his mg and, cr and  potassium are due

## 2014-12-01 ENCOUNTER — Encounter: Payer: Self-pay | Admitting: Internal Medicine

## 2014-12-01 DIAGNOSIS — E876 Hypokalemia: Secondary | ICD-10-CM | POA: Insufficient documentation

## 2014-12-01 DIAGNOSIS — E785 Hyperlipidemia, unspecified: Secondary | ICD-10-CM | POA: Insufficient documentation

## 2014-12-01 DIAGNOSIS — D696 Thrombocytopenia, unspecified: Secondary | ICD-10-CM | POA: Insufficient documentation

## 2014-12-01 NOTE — Assessment & Plan Note (Addendum)
Initially Secondary to large traumatic hematoma of knee in July resulting in symptomatic anemia requiring transfusion of 2 units durig July hospitalization .Howeve, he has  been taking iron supplements once daily ,since then and his hgb dropped with 9.9 during December hospitalization despite aggressive diuresis.  He takes eliquis for embolic stroke prevention and now has thrombocytopenia as well. Will need hematology evaluation and  And GI evaluation to determine the overall benefit of continued anticoagulation.

## 2014-12-01 NOTE — Assessment & Plan Note (Addendum)
His new onset thrombocytopenia was not addressed during December hospitalization , nor was his drop in hgb from 10.8 to 9.9 , which is concerning since he was diuresed aggressively. .  Will refer back to Dr Grayland Ormond for repeat evaluation (June 2014 for same, no BM aspiration done)

## 2014-12-01 NOTE — Assessment & Plan Note (Signed)
He was treated for hypokalemia and discharged homd  on supplements, spironolactone and and ARB.  Repeat potassium level  Has normalized .  Continue current regimen  Lab Results  Component Value Date   NA 138 11/28/2014   K 4.4 11/28/2014   CL 102 11/28/2014   CO2 29 11/28/2014

## 2014-12-01 NOTE — Assessment & Plan Note (Signed)
His lipids are much lower than target on low dose simvastatin.  Given his concurrent anemia and thrombocytopenia, will recommend  suspension of simvastatin.

## 2014-12-01 NOTE — Assessment & Plan Note (Signed)
He was treated for  Low mg with infusions and discharged home on  oral supplementation.  Repeat level is pending

## 2014-12-01 NOTE — Assessment & Plan Note (Signed)
His risk of embolic stroke was previously managed with coumadin until he had a large traumatic hematoma to knee requiring transfusion. He now takes Eliquis, but his worsening anemia and new onset thrombocytopenia need further evaluation

## 2014-12-03 DIAGNOSIS — I2510 Atherosclerotic heart disease of native coronary artery without angina pectoris: Secondary | ICD-10-CM | POA: Diagnosis not present

## 2014-12-03 DIAGNOSIS — I259 Chronic ischemic heart disease, unspecified: Secondary | ICD-10-CM | POA: Diagnosis not present

## 2014-12-03 DIAGNOSIS — I5023 Acute on chronic systolic (congestive) heart failure: Secondary | ICD-10-CM | POA: Diagnosis not present

## 2014-12-03 DIAGNOSIS — Z7901 Long term (current) use of anticoagulants: Secondary | ICD-10-CM | POA: Diagnosis not present

## 2014-12-03 DIAGNOSIS — I10 Essential (primary) hypertension: Secondary | ICD-10-CM | POA: Diagnosis not present

## 2014-12-03 DIAGNOSIS — I4891 Unspecified atrial fibrillation: Secondary | ICD-10-CM | POA: Diagnosis not present

## 2014-12-04 ENCOUNTER — Encounter: Payer: Self-pay | Admitting: Internal Medicine

## 2014-12-04 ENCOUNTER — Other Ambulatory Visit (INDEPENDENT_AMBULATORY_CARE_PROVIDER_SITE_OTHER): Payer: Medicare Other

## 2014-12-04 ENCOUNTER — Ambulatory Visit (INDEPENDENT_AMBULATORY_CARE_PROVIDER_SITE_OTHER): Payer: Medicare Other

## 2014-12-04 VITALS — BP 138/58 | HR 73

## 2014-12-04 DIAGNOSIS — Z862 Personal history of diseases of the blood and blood-forming organs and certain disorders involving the immune mechanism: Secondary | ICD-10-CM | POA: Diagnosis not present

## 2014-12-04 DIAGNOSIS — I10 Essential (primary) hypertension: Secondary | ICD-10-CM

## 2014-12-04 DIAGNOSIS — Z79899 Other long term (current) drug therapy: Secondary | ICD-10-CM

## 2014-12-04 LAB — CBC WITH DIFFERENTIAL/PLATELET
Basophils Absolute: 0 10*3/uL (ref 0.0–0.1)
Basophils Relative: 0.4 % (ref 0.0–3.0)
Eosinophils Absolute: 0.1 10*3/uL (ref 0.0–0.7)
Eosinophils Relative: 1.2 % (ref 0.0–5.0)
HCT: 33.7 % — ABNORMAL LOW (ref 39.0–52.0)
Hemoglobin: 11 g/dL — ABNORMAL LOW (ref 13.0–17.0)
Lymphocytes Relative: 16.4 % (ref 12.0–46.0)
Lymphs Abs: 1.1 10*3/uL (ref 0.7–4.0)
MCHC: 32.7 g/dL (ref 30.0–36.0)
MCV: 94.4 fl (ref 78.0–100.0)
Monocytes Absolute: 0.5 10*3/uL (ref 0.1–1.0)
Monocytes Relative: 7.6 % (ref 3.0–12.0)
Neutro Abs: 4.8 10*3/uL (ref 1.4–7.7)
Neutrophils Relative %: 74.4 % (ref 43.0–77.0)
Platelets: 146 10*3/uL — ABNORMAL LOW (ref 150.0–400.0)
RBC: 3.57 Mil/uL — ABNORMAL LOW (ref 4.22–5.81)
RDW: 17.9 % — ABNORMAL HIGH (ref 11.5–15.5)
WBC: 6.5 10*3/uL (ref 4.0–10.5)

## 2014-12-04 LAB — COMPREHENSIVE METABOLIC PANEL
ALT: 12 U/L (ref 0–53)
AST: 18 U/L (ref 0–37)
Albumin: 4.1 g/dL (ref 3.5–5.2)
Alkaline Phosphatase: 78 U/L (ref 39–117)
BUN: 15 mg/dL (ref 6–23)
CO2: 30 mEq/L (ref 19–32)
Calcium: 9.3 mg/dL (ref 8.4–10.5)
Chloride: 101 mEq/L (ref 96–112)
Creatinine, Ser: 1 mg/dL (ref 0.4–1.5)
GFR: 81.55 mL/min (ref >60.00)
Glucose, Bld: 90 mg/dL (ref 70–99)
Potassium: 4.9 mEq/L (ref 3.5–5.1)
Sodium: 137 mEq/L (ref 135–145)
Total Bilirubin: 1 mg/dL (ref 0.2–1.2)
Total Protein: 6.9 g/dL (ref 6.0–8.3)

## 2014-12-04 LAB — IBC PANEL
Iron: 67 ug/dL (ref 42–165)
Saturation Ratios: 21.1 % (ref 20.0–50.0)
Transferrin: 226.9 mg/dL (ref 212.0–360.0)

## 2014-12-04 NOTE — Progress Notes (Signed)
Patient was seen for a blood pressure check.  Reading at 130/58, pulse 73 and confirmed he is taking Lisinopril 2.75m BID as instructed. Patient given stool kit and taught how to use.  Patient verbalized understanding with teach back.

## 2014-12-05 ENCOUNTER — Other Ambulatory Visit: Payer: Medicare Other

## 2014-12-05 ENCOUNTER — Other Ambulatory Visit: Payer: Self-pay | Admitting: Internal Medicine

## 2014-12-05 DIAGNOSIS — Z1211 Encounter for screening for malignant neoplasm of colon: Secondary | ICD-10-CM

## 2014-12-06 ENCOUNTER — Encounter: Payer: Self-pay | Admitting: Internal Medicine

## 2014-12-09 DIAGNOSIS — I5022 Chronic systolic (congestive) heart failure: Secondary | ICD-10-CM | POA: Diagnosis not present

## 2014-12-09 DIAGNOSIS — Z951 Presence of aortocoronary bypass graft: Secondary | ICD-10-CM | POA: Diagnosis not present

## 2014-12-10 ENCOUNTER — Ambulatory Visit: Payer: Self-pay | Admitting: Family

## 2014-12-10 DIAGNOSIS — I110 Hypertensive heart disease with heart failure: Secondary | ICD-10-CM | POA: Diagnosis not present

## 2014-12-10 DIAGNOSIS — J449 Chronic obstructive pulmonary disease, unspecified: Secondary | ICD-10-CM | POA: Diagnosis not present

## 2014-12-10 DIAGNOSIS — F329 Major depressive disorder, single episode, unspecified: Secondary | ICD-10-CM | POA: Diagnosis not present

## 2014-12-10 DIAGNOSIS — I5022 Chronic systolic (congestive) heart failure: Secondary | ICD-10-CM | POA: Diagnosis not present

## 2014-12-10 DIAGNOSIS — I4891 Unspecified atrial fibrillation: Secondary | ICD-10-CM | POA: Diagnosis not present

## 2014-12-10 DIAGNOSIS — G4733 Obstructive sleep apnea (adult) (pediatric): Secondary | ICD-10-CM | POA: Diagnosis not present

## 2014-12-10 DIAGNOSIS — E785 Hyperlipidemia, unspecified: Secondary | ICD-10-CM | POA: Diagnosis not present

## 2014-12-10 DIAGNOSIS — E876 Hypokalemia: Secondary | ICD-10-CM | POA: Diagnosis not present

## 2014-12-12 ENCOUNTER — Encounter: Payer: Self-pay | Admitting: Internal Medicine

## 2014-12-12 DIAGNOSIS — I5022 Chronic systolic (congestive) heart failure: Secondary | ICD-10-CM | POA: Diagnosis not present

## 2014-12-12 DIAGNOSIS — Z951 Presence of aortocoronary bypass graft: Secondary | ICD-10-CM | POA: Diagnosis not present

## 2014-12-13 DIAGNOSIS — I259 Chronic ischemic heart disease, unspecified: Secondary | ICD-10-CM | POA: Diagnosis not present

## 2014-12-13 DIAGNOSIS — Z7901 Long term (current) use of anticoagulants: Secondary | ICD-10-CM | POA: Diagnosis not present

## 2014-12-13 DIAGNOSIS — I4891 Unspecified atrial fibrillation: Secondary | ICD-10-CM | POA: Diagnosis not present

## 2014-12-13 DIAGNOSIS — I2510 Atherosclerotic heart disease of native coronary artery without angina pectoris: Secondary | ICD-10-CM | POA: Diagnosis not present

## 2014-12-13 DIAGNOSIS — I10 Essential (primary) hypertension: Secondary | ICD-10-CM | POA: Diagnosis not present

## 2014-12-13 DIAGNOSIS — I5023 Acute on chronic systolic (congestive) heart failure: Secondary | ICD-10-CM | POA: Diagnosis not present

## 2014-12-16 ENCOUNTER — Ambulatory Visit: Payer: Self-pay | Admitting: Oncology

## 2014-12-16 DIAGNOSIS — Z923 Personal history of irradiation: Secondary | ICD-10-CM | POA: Diagnosis not present

## 2014-12-16 DIAGNOSIS — I10 Essential (primary) hypertension: Secondary | ICD-10-CM | POA: Diagnosis not present

## 2014-12-16 DIAGNOSIS — D696 Thrombocytopenia, unspecified: Secondary | ICD-10-CM | POA: Diagnosis not present

## 2014-12-16 DIAGNOSIS — I2510 Atherosclerotic heart disease of native coronary artery without angina pectoris: Secondary | ICD-10-CM | POA: Diagnosis not present

## 2014-12-16 DIAGNOSIS — Z8546 Personal history of malignant neoplasm of prostate: Secondary | ICD-10-CM | POA: Diagnosis not present

## 2014-12-16 DIAGNOSIS — Z79899 Other long term (current) drug therapy: Secondary | ICD-10-CM | POA: Diagnosis not present

## 2014-12-16 DIAGNOSIS — Z951 Presence of aortocoronary bypass graft: Secondary | ICD-10-CM | POA: Diagnosis not present

## 2014-12-16 DIAGNOSIS — G4733 Obstructive sleep apnea (adult) (pediatric): Secondary | ICD-10-CM | POA: Diagnosis not present

## 2014-12-16 DIAGNOSIS — I255 Ischemic cardiomyopathy: Secondary | ICD-10-CM | POA: Diagnosis not present

## 2014-12-16 DIAGNOSIS — D509 Iron deficiency anemia, unspecified: Secondary | ICD-10-CM | POA: Diagnosis not present

## 2014-12-16 LAB — CBC AND DIFFERENTIAL
Hemoglobin: 11.2 g/dL — AB (ref 13.5–17.5)
Platelets: 122 10*3/uL — AB (ref 150–399)
WBC: 6.9 10^3/mL

## 2014-12-16 LAB — IRON AND TIBC
Iron Bind.Cap.(Total): 289 ug/dL (ref 250–450)
Iron Saturation: 17 %
Iron: 50 ug/dL — ABNORMAL LOW (ref 65–175)
Unbound Iron-Bind.Cap.: 239 ug/dL

## 2014-12-16 LAB — CBC CANCER CENTER
Basophil #: 0.1 x10 3/mm (ref 0.0–0.1)
Basophil %: 0.8 %
Eosinophil #: 0.2 x10 3/mm (ref 0.0–0.7)
Eosinophil %: 2.7 %
HCT: 34.2 % — ABNORMAL LOW (ref 40.0–52.0)
HGB: 11.2 g/dL — ABNORMAL LOW (ref 13.0–18.0)
Lymphocyte #: 1.1 x10 3/mm (ref 1.0–3.6)
Lymphocyte %: 16.3 %
MCH: 30.9 pg (ref 26.0–34.0)
MCHC: 32.9 g/dL (ref 32.0–36.0)
MCV: 94 fL (ref 80–100)
Monocyte #: 0.7 x10 3/mm (ref 0.2–1.0)
Monocyte %: 9.7 %
Neutrophil #: 4.9 x10 3/mm (ref 1.4–6.5)
Neutrophil %: 70.5 %
Platelet: 122 x10 3/mm — ABNORMAL LOW (ref 150–440)
RBC: 3.64 10*6/uL — ABNORMAL LOW (ref 4.40–5.90)
RDW: 17.4 % — ABNORMAL HIGH (ref 11.5–14.5)
WBC: 6.9 x10 3/mm (ref 3.8–10.6)

## 2014-12-16 LAB — FERRITIN: Ferritin (ARMC): 484 ng/mL — ABNORMAL HIGH (ref 8–388)

## 2014-12-19 ENCOUNTER — Other Ambulatory Visit: Payer: Self-pay | Admitting: Internal Medicine

## 2014-12-24 DIAGNOSIS — I4891 Unspecified atrial fibrillation: Secondary | ICD-10-CM | POA: Diagnosis not present

## 2014-12-24 DIAGNOSIS — I2510 Atherosclerotic heart disease of native coronary artery without angina pectoris: Secondary | ICD-10-CM | POA: Diagnosis not present

## 2014-12-24 DIAGNOSIS — I259 Chronic ischemic heart disease, unspecified: Secondary | ICD-10-CM | POA: Diagnosis not present

## 2014-12-24 DIAGNOSIS — Z7901 Long term (current) use of anticoagulants: Secondary | ICD-10-CM | POA: Diagnosis not present

## 2014-12-24 DIAGNOSIS — I10 Essential (primary) hypertension: Secondary | ICD-10-CM | POA: Diagnosis not present

## 2014-12-24 DIAGNOSIS — I5023 Acute on chronic systolic (congestive) heart failure: Secondary | ICD-10-CM | POA: Diagnosis not present

## 2014-12-26 ENCOUNTER — Encounter: Payer: Self-pay | Admitting: Internal Medicine

## 2014-12-31 DIAGNOSIS — I4891 Unspecified atrial fibrillation: Secondary | ICD-10-CM | POA: Diagnosis not present

## 2014-12-31 DIAGNOSIS — I5023 Acute on chronic systolic (congestive) heart failure: Secondary | ICD-10-CM | POA: Diagnosis not present

## 2014-12-31 DIAGNOSIS — I10 Essential (primary) hypertension: Secondary | ICD-10-CM | POA: Diagnosis not present

## 2014-12-31 DIAGNOSIS — I259 Chronic ischemic heart disease, unspecified: Secondary | ICD-10-CM | POA: Diagnosis not present

## 2014-12-31 DIAGNOSIS — Z7901 Long term (current) use of anticoagulants: Secondary | ICD-10-CM | POA: Diagnosis not present

## 2014-12-31 DIAGNOSIS — I2510 Atherosclerotic heart disease of native coronary artery without angina pectoris: Secondary | ICD-10-CM | POA: Diagnosis not present

## 2015-01-06 ENCOUNTER — Ambulatory Visit: Payer: Self-pay | Admitting: Oncology

## 2015-01-13 ENCOUNTER — Ambulatory Visit: Payer: Self-pay | Admitting: Family

## 2015-01-13 DIAGNOSIS — I5022 Chronic systolic (congestive) heart failure: Secondary | ICD-10-CM | POA: Diagnosis not present

## 2015-01-13 DIAGNOSIS — F329 Major depressive disorder, single episode, unspecified: Secondary | ICD-10-CM | POA: Diagnosis not present

## 2015-01-13 DIAGNOSIS — E785 Hyperlipidemia, unspecified: Secondary | ICD-10-CM | POA: Diagnosis not present

## 2015-01-13 DIAGNOSIS — G4733 Obstructive sleep apnea (adult) (pediatric): Secondary | ICD-10-CM | POA: Diagnosis not present

## 2015-01-13 DIAGNOSIS — J449 Chronic obstructive pulmonary disease, unspecified: Secondary | ICD-10-CM | POA: Diagnosis not present

## 2015-01-13 DIAGNOSIS — I4891 Unspecified atrial fibrillation: Secondary | ICD-10-CM | POA: Diagnosis not present

## 2015-01-13 DIAGNOSIS — I110 Hypertensive heart disease with heart failure: Secondary | ICD-10-CM | POA: Diagnosis not present

## 2015-01-17 ENCOUNTER — Ambulatory Visit (INDEPENDENT_AMBULATORY_CARE_PROVIDER_SITE_OTHER): Payer: Medicare Other | Admitting: Internal Medicine

## 2015-01-17 ENCOUNTER — Encounter: Payer: Self-pay | Admitting: Internal Medicine

## 2015-01-17 VITALS — BP 100/52 | HR 64 | Temp 98.6°F | Resp 14 | Ht 75.0 in | Wt 248.0 lb

## 2015-01-17 DIAGNOSIS — D62 Acute posthemorrhagic anemia: Secondary | ICD-10-CM

## 2015-01-17 DIAGNOSIS — I255 Ischemic cardiomyopathy: Secondary | ICD-10-CM | POA: Diagnosis not present

## 2015-01-17 DIAGNOSIS — Z7901 Long term (current) use of anticoagulants: Secondary | ICD-10-CM | POA: Diagnosis not present

## 2015-01-17 DIAGNOSIS — D500 Iron deficiency anemia secondary to blood loss (chronic): Secondary | ICD-10-CM | POA: Diagnosis not present

## 2015-01-17 DIAGNOSIS — E538 Deficiency of other specified B group vitamins: Secondary | ICD-10-CM

## 2015-01-17 DIAGNOSIS — J90 Pleural effusion, not elsewhere classified: Secondary | ICD-10-CM

## 2015-01-17 DIAGNOSIS — R0600 Dyspnea, unspecified: Secondary | ICD-10-CM | POA: Diagnosis not present

## 2015-01-17 DIAGNOSIS — R634 Abnormal weight loss: Secondary | ICD-10-CM

## 2015-01-17 DIAGNOSIS — R0689 Other abnormalities of breathing: Secondary | ICD-10-CM

## 2015-01-17 DIAGNOSIS — E785 Hyperlipidemia, unspecified: Secondary | ICD-10-CM

## 2015-01-17 MED ORDER — CYANOCOBALAMIN 1000 MCG/ML IJ SOLN
1000.0000 ug | Freq: Once | INTRAMUSCULAR | Status: AC
  Administered 2015-01-17: 1000 ug via INTRAMUSCULAR

## 2015-01-17 NOTE — Progress Notes (Signed)
Pre-visit discussion using our clinic review tool. No additional management support is needed unless otherwise documented below in the visit note.  

## 2015-01-17 NOTE — Progress Notes (Signed)
Patient ID: Rick Gilmore, male   DOB: 11-05-1941, 74 y.o.   MRN: [id number]   Patient Active Problem List   Diagnosis Date Noted  . Hypomagnesemia 12/01/2014  . Hypokalemia 12/01/2014  . Thrombocytopenia 12/01/2014  . Hyperlipidemia LDL goal <70 12/01/2014  . Pleural effusion, bilateral 10/17/2014  . Sprain of right knee/leg 06/12/2014  . Iron deficiency anemia due to chronic blood loss 06/12/2014  . Long term current use of anticoagulant therapy 11/02/2011  . Pernicious anemia 11/02/2011  . Hx of adenomatous colonic polyps 11/02/2011  . Personal history of prostate cancer   . Ischemic cardiomyopathy   . Sleep apnea, obstructive     Subjective:  CC:   Chief Complaint  Patient presents with  . Follow-up    General follow up    HPI:   Rick Gilmore is a 74 y.o. male who presents for  Follow up on chronic conditions including ischemic cardiomyopathy, obesity with OSA and presumed pulmonary hypertension  His weight is down 11 lbs since  dec 24th.   He reached a low of 236 lbs.  He has successfully finished participating in [facility]  C/P rehab program and was recenlty able to walk a mile without tiring .   His weight has stabilized and his appeti te is excellent,  Following a low salt diet .  Has had some hypotension with cardura   On Feb 8  So he stopped it over a week ago,   seeing finnegan in May again,  Last hgb here was 11.0 iron was continued  by him at last  Visit   He is using 02  With CPAP at night, But would like to discontinue it.  States that he has left the 02 off several nightls recently when going to sleep and did not wake up tachycardic, with a headache or feeling poorly.    Past Medical History  Diagnosis Date  . CAD (coronary artery disease)   . Atrial fibrillation     on Coumadin  . Hypertension   . Erectile dysfunction   . Hyperlipidemia   . Ischemic cardiomyopathy     s/p AICD/pacer 2009, replaced 2010 [facility]  . Cancer 2001    Prostate, XRT and implant   . Tubular adenoma     colon polyp  . Personal history of prostate cancer 2001    s/p XRT and implant ([facility])  . Sleep apnea, obstructive     uses CPAP nightly    Past Surgical History  Procedure Laterality Date  . Vasectomy    . Coronary artery bypass graft  1988  . Implantable cardioverter defibrillator generator change  April 2014    Bi V RRR       The following portions of the patient's history were reviewed and updated as appropriate: Allergies, current medications, and problem list.    Review of Systems:   Patient denies headache, fevers, malaise, unintentional weight loss, skin rash, eye pain, sinus congestion and sinus pain, sore throat, dysphagia,  hemoptysis , cough, dyspnea, wheezing, chest pain, palpitations, orthopnea, edema, abdominal pain, nausea, melena, diarrhea, constipation, flank pain, dysuria, hematuria, urinary  Frequency, nocturia, numbness, tingling, seizures,  Focal weakness, Loss of consciousness,  Tremor, insomnia, depression, anxiety, and suicidal ideation.     History   Social History  . Marital Status: Single    Spouse Name: [name]  . Number of Children: [name]  . Years of Education: [name]   Occupational History  . Not on file.   Social History Main  Topics  . Smoking status: Never Smoker   . Smokeless tobacco: Never Used  . Alcohol Use: 1.0 oz/week    2 drink(s) per week  . Drug Use: No  . Sexual Activity: Not on file   Other Topics Concern  . Not on file   Social History Narrative    Objective:  Filed Vitals:   01/17/15 1339  BP: 100/52  Pulse: 64  Temp: 98.6 F (37 C)  Resp: 14     General appearance: alert, cooperative and appears stated age Ears: normal TM's and external ear canals both ears Throat: lips, mucosa, and tongue normal; teeth and gums normal Neck: no adenopathy, no carotid bruit, supple, symmetrical, trachea midline and thyroid not enlarged, symmetric, no tenderness/mass/nodules Back: symmetric, no curvature.  ROM normal. No CVA tenderness. Lungs: clear to auscultation bilaterally Heart: regular rate and rhythm, S1, S2 normal, no murmur, click, rub or gallop Abdomen: soft, non-tender; bowel sounds normal; no masses,  no organomegaly Pulses: 2+ and symmetric Skin: Skin color, texture, turgor normal. No rashes or lesions Lymph nodes: Cervical, supraclavicular, and axillary nodes normal.  Assessment and Plan:  Pleural effusion, bilateral His lung exam today suggests that the effusions , which were likely transudative from CHF, have resolved.    Ischemic cardiomyopathy He has increased endurance after participating in thr C/P rehab program at [facility] and is now walking a mile daily .  His wt is stable and he is following a low salt diet. Lungs are clear on exam today    Dyspnea and respiratory abnormality Improved with supervised exercise program and wt loss. He is walking a mile daily now.  Advised to follow up with Ffleming  His regular pulmonologist to discuss whether right sided heart cath is absolutely necessary at this time.   Iron deficiency anemia due to chronic blood loss He has been referred to Dr Grayland Ormond for concurrent thrombocytopenia and GI evaluation to determine the overall benefit of continued anticoagulation.  Lab Results  Component Value Date   HGB 11.0* 12/04/2014   Lab Results  Component Value Date   PLT 146.0* 12/04/2014         Long term current use of anticoagulant therapy His hgb and platelets have remained stable on Eliquis over the last several months.   A total of 25 minutes of face to face time was spent with patient more than half of which was spent in counselling on the above mentioned issues.   Updated Medication List Outpatient Encounter Prescriptions as of 01/17/2015  Medication Sig  . albuterol (PROVENTIL HFA;VENTOLIN HFA) 108 (90 BASE) MCG/ACT inhaler Inhale 2 puffs into the lungs every 6 (six) hours as needed for wheezing or shortness of breath.   . allopurinol (ZYLOPRIM) 300 MG tablet Take 1 tablet (300 mg total) by mouth daily.  Jearl Klinefelter ELLIPTA 62.5-25 MCG/INH AEPB Take 1 puff by mouth daily.  Marland Kitchen apixaban (ELIQUIS) 5 MG TABS tablet Take 1 tablet (5 mg total) by mouth 2 (two) times daily.  . cyanocobalamin (,VITAMIN B-12,) 1000 MCG/ML injection Inject 1,000 mcg into the muscle every 30 (thirty) days.  Marland Kitchen escitalopram (LEXAPRO) 10 MG tablet TAKE 1 TABLET DAILY  . furosemide (LASIX) 80 MG tablet TAKE 1 TABLET DAILY  . iron polysaccharides (FERREX 150) 150 MG capsule Take 1 capsule (150 mg total) by mouth daily.  . isosorbide mononitrate (IMDUR) 30 MG 24 hr tablet Take 30 mg by mouth daily.  Marland Kitchen lisinopril (PRINIVIL,ZESTRIL) 5 MG tablet Take 2.5 mg by mouth  2 (two) times daily.  . magnesium oxide (MAG-OX) 400 MG tablet Take 1 tablet (400 mg total) by mouth daily.  . metoprolol succinate (TOPROL-XL) 100 MG 24 hr tablet TAKE 1 TABLET DAILY  . NEXIUM 40 MG capsule TAKE 1 CAPSULE DAILY BEFORE BREAKFAST  . OXYGEN Inhale 2 L into the lungs at bedtime.  . potassium chloride SA (K-DUR,KLOR-CON) 20 MEQ tablet Take 1 tablet (20 mEq total) by mouth daily.  . simvastatin (ZOCOR) 10 MG tablet TAKE 1 TABLET AT BEDTIME  . spironolactone (ALDACTONE) 25 MG tablet Take 1 tablet (25 mg total) by mouth daily.  . [DISCONTINUED] colchicine 0.6 MG tablet Take 1 tablet (0.6 mg total) by mouth 2 (two) times daily. (Patient not taking: Reported on 11/28/2014)  . [DISCONTINUED] doxazosin (CARDURA) 4 MG tablet TAKE 1 TABLET AT BEDTIME (Patient not taking: Reported on 01/17/2015)  . [EXPIRED] cyanocobalamin ((VITAMIN B-12)) injection 1,000 mcg      Orders Placed This Encounter  Procedures  . TSH  . Lipid panel  . Comprehensive metabolic panel  . CBC with Differential/Platelet  . Ferritin  . IBC panel  . CBC and differential  . HM COLONOSCOPY  . HM COLONOSCOPY    Return in about 3 months (around 04/17/2015).

## 2015-01-17 NOTE — Patient Instructions (Addendum)
You are doing very well!   i do not think you will need a heart cath given your significant improvement  To get off the nighttime oxygen,  You will need a repeat overnight sleep study    Do not start any new BP meds if systolic BP is s < [id number]   Return next week for fasting labs,    You can use Delsym for cough,  Gargle with salt water and flush nose with simply saline or Neil's   sinus rinse   Afrin nasal spray for sinus congestion

## 2015-01-19 ENCOUNTER — Encounter: Payer: Self-pay | Admitting: Internal Medicine

## 2015-01-19 NOTE — Assessment & Plan Note (Signed)
He has increased endurance after participating in thr C/P rehab program at [facility] and is now walking a mile daily .  His wt is stable and he is following a low salt diet. Lungs are clear on exam today

## 2015-01-19 NOTE — Assessment & Plan Note (Addendum)
Improved with supervised exercise program and wt loss. He is walking a mile daily now.  Advised to follow up with Ffleming  His regular pulmonologist to discuss whether right sided heart cath is absolutely necessary at this time.

## 2015-01-19 NOTE — Assessment & Plan Note (Signed)
He has been referred to Dr Grayland Ormond for concurrent thrombocytopenia and GI evaluation to determine the overall benefit of continued anticoagulation.  Lab Results  Component Value Date   HGB 11.0* 12/04/2014   Lab Results  Component Value Date   PLT 146.0* 12/04/2014

## 2015-01-19 NOTE — Assessment & Plan Note (Signed)
His lung exam today suggests that the effusions , which were likely transudative from CHF, have resolved.

## 2015-01-19 NOTE — Assessment & Plan Note (Signed)
His hgb and platelets have remained stable on Eliquis over the last several months.

## 2015-01-20 ENCOUNTER — Other Ambulatory Visit: Payer: Medicare Other

## 2015-01-21 ENCOUNTER — Other Ambulatory Visit: Payer: Self-pay | Admitting: *Deleted

## 2015-01-21 ENCOUNTER — Other Ambulatory Visit (INDEPENDENT_AMBULATORY_CARE_PROVIDER_SITE_OTHER): Payer: Medicare Other

## 2015-01-21 DIAGNOSIS — Z1211 Encounter for screening for malignant neoplasm of colon: Secondary | ICD-10-CM

## 2015-01-21 LAB — FECAL OCCULT BLOOD, IMMUNOCHEMICAL: Fecal Occult Bld: POSITIVE — AB

## 2015-01-22 ENCOUNTER — Telehealth: Payer: Self-pay | Admitting: Internal Medicine

## 2015-01-22 ENCOUNTER — Other Ambulatory Visit (INDEPENDENT_AMBULATORY_CARE_PROVIDER_SITE_OTHER): Payer: Medicare Other

## 2015-01-22 DIAGNOSIS — R195 Other fecal abnormalities: Secondary | ICD-10-CM

## 2015-01-22 DIAGNOSIS — E785 Hyperlipidemia, unspecified: Secondary | ICD-10-CM

## 2015-01-22 DIAGNOSIS — D62 Acute posthemorrhagic anemia: Secondary | ICD-10-CM

## 2015-01-22 DIAGNOSIS — R634 Abnormal weight loss: Secondary | ICD-10-CM

## 2015-01-22 DIAGNOSIS — D649 Anemia, unspecified: Secondary | ICD-10-CM

## 2015-01-22 LAB — COMPREHENSIVE METABOLIC PANEL
ALT: 7 U/L (ref 0–53)
AST: 12 U/L (ref 0–37)
Albumin: 3.8 g/dL (ref 3.5–5.2)
Alkaline Phosphatase: 60 U/L (ref 39–117)
BUN: 30 mg/dL — ABNORMAL HIGH (ref 6–23)
CO2: 26 mEq/L (ref 19–32)
Calcium: 9.4 mg/dL (ref 8.4–10.5)
Chloride: 102 mEq/L (ref 96–112)
Creatinine, Ser: 1.04 mg/dL (ref 0.40–1.50)
GFR: 74.33 mL/min (ref >60.00)
Glucose, Bld: 92 mg/dL (ref 70–99)
Potassium: 5 mEq/L (ref 3.5–5.1)
Sodium: 134 mEq/L — ABNORMAL LOW (ref 135–145)
Total Bilirubin: 0.6 mg/dL (ref 0.2–1.2)
Total Protein: 6.8 g/dL (ref 6.0–8.3)

## 2015-01-22 LAB — CBC WITH DIFFERENTIAL/PLATELET
Basophils Absolute: 0 10*3/uL (ref 0.0–0.1)
Basophils Relative: 0.3 % (ref 0.0–3.0)
Eosinophils Absolute: 0.1 10*3/uL (ref 0.0–0.7)
Eosinophils Relative: 1.6 % (ref 0.0–5.0)
HCT: 30.9 % — ABNORMAL LOW (ref 39.0–52.0)
Hemoglobin: 10.3 g/dL — ABNORMAL LOW (ref 13.0–17.0)
Lymphocytes Relative: 19.7 % (ref 12.0–46.0)
Lymphs Abs: 1.2 10*3/uL (ref 0.7–4.0)
MCHC: 33.5 g/dL (ref 30.0–36.0)
MCV: 94 fl (ref 78.0–100.0)
Monocytes Absolute: 0.6 10*3/uL (ref 0.1–1.0)
Monocytes Relative: 9.8 % (ref 3.0–12.0)
Neutro Abs: 4.1 10*3/uL (ref 1.4–7.7)
Neutrophils Relative %: 68.6 % (ref 43.0–77.0)
Platelets: 136 10*3/uL — ABNORMAL LOW (ref 150.0–400.0)
RBC: 3.29 Mil/uL — ABNORMAL LOW (ref 4.22–5.81)
RDW: 17.8 % — ABNORMAL HIGH (ref 11.5–15.5)
WBC: 6 10*3/uL (ref 4.0–10.5)

## 2015-01-22 LAB — LIPID PANEL
Cholesterol: 123 mg/dL (ref 0–200)
HDL: 40.8 mg/dL (ref >39.00)
LDL Cholesterol: 72 mg/dL (ref 0–99)
NonHDL: 82.2
Total CHOL/HDL Ratio: 3
Triglycerides: 53 mg/dL (ref 0.0–149.0)
VLDL: 10.6 mg/dL (ref 0.0–40.0)

## 2015-01-22 LAB — TSH: TSH: 1.82 u[IU]/mL (ref 0.35–4.50)

## 2015-01-22 LAB — IBC PANEL
Iron: 65 ug/dL (ref 42–165)
Saturation Ratios: 20.5 % (ref 20.0–50.0)
Transferrin: 226 mg/dL (ref 212.0–360.0)

## 2015-01-22 LAB — FECAL OCCULT BLOOD, GUAIAC: Fecal Occult Blood: POSITIVE

## 2015-01-22 LAB — FERRITIN: Ferritin: 309.9 ng/mL (ref 22.0–322.0)

## 2015-01-22 NOTE — Telephone Encounter (Signed)
Error

## 2015-01-27 ENCOUNTER — Encounter: Payer: Self-pay | Admitting: Internal Medicine

## 2015-01-27 NOTE — Addendum Note (Signed)
Addended by: Crecencio Mc on: 01/27/2015 11:48 PM   Modules accepted: Orders

## 2015-01-30 ENCOUNTER — Ambulatory Visit: Payer: Self-pay | Admitting: Family

## 2015-01-30 DIAGNOSIS — J449 Chronic obstructive pulmonary disease, unspecified: Secondary | ICD-10-CM | POA: Diagnosis not present

## 2015-01-30 DIAGNOSIS — E785 Hyperlipidemia, unspecified: Secondary | ICD-10-CM | POA: Diagnosis not present

## 2015-01-30 DIAGNOSIS — I5020 Unspecified systolic (congestive) heart failure: Secondary | ICD-10-CM | POA: Diagnosis not present

## 2015-01-30 DIAGNOSIS — I10 Essential (primary) hypertension: Secondary | ICD-10-CM | POA: Diagnosis not present

## 2015-01-30 DIAGNOSIS — Z79899 Other long term (current) drug therapy: Secondary | ICD-10-CM | POA: Diagnosis not present

## 2015-02-03 DIAGNOSIS — G4733 Obstructive sleep apnea (adult) (pediatric): Secondary | ICD-10-CM | POA: Diagnosis not present

## 2015-02-03 DIAGNOSIS — R0609 Other forms of dyspnea: Secondary | ICD-10-CM | POA: Diagnosis not present

## 2015-02-03 DIAGNOSIS — J449 Chronic obstructive pulmonary disease, unspecified: Secondary | ICD-10-CM | POA: Diagnosis not present

## 2015-02-03 DIAGNOSIS — I255 Ischemic cardiomyopathy: Secondary | ICD-10-CM | POA: Diagnosis not present

## 2015-02-05 DIAGNOSIS — G4733 Obstructive sleep apnea (adult) (pediatric): Secondary | ICD-10-CM | POA: Diagnosis not present

## 2015-02-07 DIAGNOSIS — G4733 Obstructive sleep apnea (adult) (pediatric): Secondary | ICD-10-CM | POA: Diagnosis not present

## 2015-02-23 ENCOUNTER — Telehealth: Payer: Self-pay | Admitting: Internal Medicine

## 2015-02-23 ENCOUNTER — Other Ambulatory Visit: Payer: Self-pay | Admitting: Internal Medicine

## 2015-02-23 NOTE — Telephone Encounter (Signed)
Letter to [facility] to orde an autotitrating CPAP trial has been drafted,  If it is not on printer, please print.

## 2015-02-24 NOTE — Telephone Encounter (Signed)
Letter signed by MD and faxed as requested to [facility].

## 2015-03-04 ENCOUNTER — Emergency Department: Payer: Self-pay | Admitting: Student

## 2015-03-04 DIAGNOSIS — S8012XA Contusion of left lower leg, initial encounter: Secondary | ICD-10-CM | POA: Diagnosis not present

## 2015-03-04 DIAGNOSIS — Z79899 Other long term (current) drug therapy: Secondary | ICD-10-CM | POA: Diagnosis not present

## 2015-03-04 DIAGNOSIS — S80212A Abrasion, left knee, initial encounter: Secondary | ICD-10-CM | POA: Diagnosis not present

## 2015-03-04 DIAGNOSIS — S8002XA Contusion of left knee, initial encounter: Secondary | ICD-10-CM | POA: Diagnosis not present

## 2015-03-05 DIAGNOSIS — I10 Essential (primary) hypertension: Secondary | ICD-10-CM | POA: Diagnosis not present

## 2015-03-05 DIAGNOSIS — I5022 Chronic systolic (congestive) heart failure: Secondary | ICD-10-CM | POA: Diagnosis not present

## 2015-03-05 DIAGNOSIS — E782 Mixed hyperlipidemia: Secondary | ICD-10-CM | POA: Diagnosis not present

## 2015-03-06 ENCOUNTER — Emergency Department
Admit: 2015-03-06 | Disposition: A | Discharge status: Home / Self Care | Payer: Self-pay | Admitting: Emergency Medicine

## 2015-03-06 DIAGNOSIS — Z4801 Encounter for change or removal of surgical wound dressing: Secondary | ICD-10-CM | POA: Diagnosis not present

## 2015-03-06 DIAGNOSIS — Z7951 Long term (current) use of inhaled steroids: Secondary | ICD-10-CM | POA: Diagnosis not present

## 2015-03-06 DIAGNOSIS — Z4800 Encounter for change or removal of nonsurgical wound dressing: Secondary | ICD-10-CM | POA: Diagnosis not present

## 2015-03-06 DIAGNOSIS — Z79899 Other long term (current) drug therapy: Secondary | ICD-10-CM | POA: Diagnosis not present

## 2015-03-08 ENCOUNTER — Other Ambulatory Visit: Payer: Self-pay | Admitting: Internal Medicine

## 2015-03-10 ENCOUNTER — Emergency Department
Admit: 2015-03-10 | Disposition: A | Discharge status: Home / Self Care | Payer: Self-pay | Admitting: Emergency Medicine

## 2015-03-10 DIAGNOSIS — Z79899 Other long term (current) drug therapy: Secondary | ICD-10-CM | POA: Diagnosis not present

## 2015-03-10 DIAGNOSIS — S8012XA Contusion of left lower leg, initial encounter: Secondary | ICD-10-CM | POA: Diagnosis not present

## 2015-03-10 DIAGNOSIS — Z4800 Encounter for change or removal of nonsurgical wound dressing: Secondary | ICD-10-CM | POA: Diagnosis not present

## 2015-03-10 DIAGNOSIS — Z4801 Encounter for change or removal of surgical wound dressing: Secondary | ICD-10-CM | POA: Diagnosis not present

## 2015-03-10 LAB — COMPREHENSIVE METABOLIC PANEL
Albumin: 4.1 g/dL
Alkaline Phosphatase: 54 U/L
Anion Gap: 7 (ref 7–16)
BUN: 28 mg/dL — ABNORMAL HIGH
Bilirubin,Total: 0.9 mg/dL
Calcium, Total: 9 mg/dL
Chloride: 107 mmol/L
Co2: 22 mmol/L
Creatinine: 1.03 mg/dL
EGFR (African American): >60
EGFR (Non-African Amer.): >60
Glucose: 95 mg/dL
Potassium: 4.5 mmol/L
SGOT(AST): 22 U/L
SGPT (ALT): 12 U/L — ABNORMAL LOW
Sodium: 136 mmol/L
Total Protein: 7 g/dL

## 2015-03-10 LAB — CBC WITH DIFFERENTIAL/PLATELET
Basophil #: 0.1 10*3/uL (ref 0.0–0.1)
Basophil %: 0.7 %
Eosinophil #: 0.1 10*3/uL (ref 0.0–0.7)
Eosinophil %: 1.1 %
HCT: 31.5 % — ABNORMAL LOW (ref 40.0–52.0)
HGB: 10.3 g/dL — ABNORMAL LOW (ref 13.0–18.0)
Lymphocyte #: 0.8 10*3/uL — ABNORMAL LOW (ref 1.0–3.6)
Lymphocyte %: 10.2 %
MCH: 32.6 pg (ref 26.0–34.0)
MCHC: 32.6 g/dL (ref 32.0–36.0)
MCV: 100 fL (ref 80–100)
Monocyte #: 0.8 x10 3/mm (ref 0.2–1.0)
Monocyte %: 10.8 %
Neutrophil #: 5.7 10*3/uL (ref 1.4–6.5)
Neutrophil %: 77.2 %
Platelet: 104 10*3/uL — ABNORMAL LOW (ref 150–440)
RBC: 3.16 10*6/uL — ABNORMAL LOW (ref 4.40–5.90)
RDW: 16.4 % — ABNORMAL HIGH (ref 11.5–14.5)
WBC: 7.4 10*3/uL (ref 3.8–10.6)

## 2015-03-10 LAB — CREATININE, SERUM: Creatine, Serum: 1.03

## 2015-03-14 ENCOUNTER — Other Ambulatory Visit: Payer: Self-pay | Admitting: Podiatry

## 2015-03-14 ENCOUNTER — Other Ambulatory Visit: Payer: Self-pay | Admitting: Internal Medicine

## 2015-03-17 ENCOUNTER — Encounter
Admit: 2015-03-17 | Disposition: A | Discharge status: Home / Self Care | Payer: Self-pay | Attending: Surgery | Admitting: Surgery

## 2015-03-17 DIAGNOSIS — S81812A Laceration without foreign body, left lower leg, initial encounter: Secondary | ICD-10-CM | POA: Diagnosis not present

## 2015-03-17 DIAGNOSIS — L97221 Non-pressure chronic ulcer of left calf limited to breakdown of skin: Secondary | ICD-10-CM | POA: Diagnosis not present

## 2015-03-17 DIAGNOSIS — L97222 Non-pressure chronic ulcer of left calf with fat layer exposed: Secondary | ICD-10-CM | POA: Diagnosis not present

## 2015-03-24 DIAGNOSIS — S81812A Laceration without foreign body, left lower leg, initial encounter: Secondary | ICD-10-CM | POA: Diagnosis not present

## 2015-03-24 DIAGNOSIS — L97222 Non-pressure chronic ulcer of left calf with fat layer exposed: Secondary | ICD-10-CM | POA: Diagnosis not present

## 2015-03-25 ENCOUNTER — Ambulatory Visit (INDEPENDENT_AMBULATORY_CARE_PROVIDER_SITE_OTHER): Payer: Medicare Other | Admitting: *Deleted

## 2015-03-25 DIAGNOSIS — E538 Deficiency of other specified B group vitamins: Secondary | ICD-10-CM | POA: Diagnosis not present

## 2015-03-25 MED ORDER — CYANOCOBALAMIN 1000 MCG/ML IJ SOLN
1000.0000 ug | Freq: Once | INTRAMUSCULAR | Status: AC
  Administered 2015-03-25: 1000 ug via INTRAMUSCULAR

## 2015-03-28 NOTE — Op Note (Signed)
PATIENT NAME:  Rick Gilmore, Rick Gilmore MR#:  [id number] DATE OF BIRTH:  November 27, 1941  DATE OF PROCEDURE:  03/16/2013  OPERATIVE NOTE:  Biventricular ICD generator change out (Medtronic).  PREPROCEDURE DIAGNOSES: 1.  Ischemic cardiomyopathy with biventricular implantable cardioverter/defibrillator device at recommended replacement time.  2.  Permanent atrial fibrillation on chronic anticoagulation with Coumadin held for today's procedure.   POSTPROCEDURE DIAGNOSES: 1.  Ischemic cardiomyopathy with biventricular implantable cardioverter/defibrillator device at recommended replacement time.  2.  Permanent atrial fibrillation on chronic anticoagulation with Coumadin held for today's procedure.  3.  Status post left-sided biventricular implantable cardioverter/defibrillator device generator change out (Medtronic).  PRIMARY OPERATOR:  Arliss Journey. Renesme Kerrigan, MD  PROCEDURES: 1.  Implantable cardioverter/defibrillator device generator removal. 2.  Pocket revision (capsulectomy). 3.  Implantable cardioverter/defibrillator device generator insertion, primary prevention, multi (CRTD).  ESTIMATED BLOOD LOSS:  Minimal.  SEDATION:  Monitored anesthesia care.  LOCATION:  OR #2.  LOCAL ANESTHETIC:  30 mL lidocaine 1%.  SEDATION MEDICATIONS:  See Anesthesia records.  INDICATIONS AND CONSENTS:  The patient was evaluated in [facility] for a biventricular ICD generator at recommended replacement time with recommendations made for consideration of biventricular ICD generator change out. Following review of the risks, benefits and alternatives, the decision made in favor of biventricular ICD generator change out. The patient had been instructed to hold his Coumadin in preparation for today's procedure. The procedure and description were reviewed and questions answered.   DESCRIPTION OF PROCEDURE:  The patient's procedure plan, the risks, benefits and alternatives were reviewed prior to  transportation to the OR in a fasting and nonsedated state. A time-out was performed. The proper resuscitative equipment was attached. The left infraclavicular region was prepped with chlorhexidine followed by ChloraPrep and draped in the usual sterile manner with Ioban over the surgical site. An incision was made in the left infraclavicular region and extended to prepectoralis fascia over the ICD generator site. The anterior aspect of the capsule was freed, the generator was removed from the pocket. The remainder of the capsule was excised with only a small portion of the region adherent to the prepectoralis fascia left intact. Hemostasis was good. Kanamycin antibiotic-soaked gauze were placed in the pocket. The leads were removed from the ICD generator and checked with PSA cables with all values remaining stable. The antibiotic-soaked gauze was removed from the pocket. The leads were connected to the ICD generator with visual confirmation of the pins completely. The pin block screws tightened, the leads tested with gentle traction to confirm stability, and a recheck to confirm pins completely through the pin block. The pocket was flushed with copious amounts of bacitracin and hemostasis was then confirmed to be good and the ICD hardware was placed in the pocket with the header facing medially and the leads posterior to the ICD generator. The incision was closed with running layers of 2-0 and 3-0 Vicryl. A 4-0 V-lock was used for the subcuticular layer. Steri-Strips and a sterile dressing were applied. A pressure dressing was applied in light of the patient's need for anticoagulation.   SUMMARY OF EXPLANTED HARDWARE:  ICD generator Medtronic CRTD model J6648950, serial number [id number] implanted 06/13/2008.  SUMMARY OF CHRONIC UTILIZED HARDWARE:  The right atrial lead model 5076, serial number [id number]. Right ventricular lead Medtronic [id number], serial number [id number]. Left ventricular lead Medtronic model B1241610,  serial number [id number], implanted 06/13/2008.Marland Kitchen  SUMMARY OF NEWLY IMPLANTED HARDWARD:  Medtronic biventricular ICD generator Viva S CRT-D, model DTBB1D1, serial number [id number].  SUMMARY OF DATA  OBTAINED THROUGH THE DEVICE:  A-fib waves measured at 1.8 mV. R-waves measured at 19.9 mV. RV threshold 0.75 volts at 0.4 msec. LV threshold 1.0 volts at 0.4 msec. Atrial pacing impedance 437 ohms. RV pacing impedance 456 ohms, and LV pacing impedance (LV tip to LV ring) 893 ohms. High voltage impedance RV equals 38 ohms, SVC equals 45 ohms.   SUMMARY OF PROGRAM PARAMETERS:  The brady pacing mode is VVIR lower rate of 60, upper sensory rate of 130, paced AV delay LV early 40 msec, RV output 2.5 volts at 0.4 msec for adaptive capture management enabled and minimum adapted amplitude of 1.5 volts (1.5 amplitude margin). Sensitivity 0.3 mV. Left ventricular lead is programmed 2.5 volts at 0.4 msec. Adaptive capture management enabled to +0.5 volts with a maximum adapted amplitude of 5.0 volts and an LV tip to LV ring configuration.   Tachy therapy is programmed as a single zone device detecting ventricular fibrillation for rates greater than 200 beats per minute, 30 out of 40 for initial detection with SVT P-R logic wavelet enhancements enabled. Ventricular fibrillation is treated with ATP during charging followed by 35 joules shocks x 6. The is a VT monitor zone from 150 to 200 beats per minute (monitor only). Mode switch is programmed on at a rate of 171 beats per minute.  ____________________________ Arliss Journey. Lena Gores, MD ddh:jm D: 03/16/2013 16:33:01 ET T: 03/17/2013 13:02:39 ET JOB#: [id number]  cc: Arliss Journey. Shakayla Hickox, MD, <Dictator> Corey Skains, MD March Rummage MD ELECTRONICALLY SIGNED 04/20/2013 13:26

## 2015-03-29 NOTE — Consult Note (Signed)
Brief Consult Note: Diagnosis: Macrocytic anemia/Thrombocytopenia.   Comments: 74 year old Caucasian male with past medical history of ischemic cardiomyopathy status post AICD placement, obstructive sleep apnea, coronary disease status post CABG, hypertension, gout, atrial fibrillation, sick sinus syndrome, B12 deficiency, prostate cancer, and chronic kidney disease stage III with Hb drop from 10 June 14 to 7 on this admission. Patient has been previously diagnosed with B12 deficiency. Anemia likely multifactorial and may be exacerbated by acute on chronic renal impairment. Agree with Transfusion. SIEP pending Will need to rule out GI bleed. Will benefit from further hematology workup depending on above results.  Electronic Signatures: Georges Mouse (MD)  (Signed 09-Jul-15 16:47)  Authored: Brief Consult Note   Last Updated: 09-Jul-15 16:47 by Georges Mouse (MD)

## 2015-03-29 NOTE — H&P (Signed)
PATIENT NAME:  Rick Gilmore, RATHMAN MR#:  [id number] DATE OF BIRTH:  12/15/1940  DATE OF ADMISSION:  11/20/2014  PRIMARY CARE PHYSICIAN: Deborra Medina, MD  REFERRING PHYSICIAN: Baird Cancer. Quale, MD  CHIEF COMPLAINT: Shortness of breath for 1 week.   HISTORY OF PRESENT ILLNESS: A 74 year old Caucasian male with a history of congestive heart failure, hypertension, atrial fibrillation, presents to the ED with the above chief complaint. The patient is alert, awake, oriented, in no acute distress. The patient has had shortness of breath for the past 7 days, which has been worsening. In addition, the patient developed a cough with dark sputum  today. The patient denies any fever or chills. The patient denies any chest pain, palpitation, orthopnea, or nocturnal dyspnea, but always has bilateral lower extremity edema, which is stable. The patient denies any other symptoms.   PAST MEDICAL HISTORY:  1.  AFib, sick sinus syndrome, status post AICD.  2.  Ischemic cardiomyopathy.  3.  B12 insufficiency.  4.  Prostate cancer.  5.  Obstructive sleep apnea with oxygen at night p.r.n.  6.  Coronary artery disease, status post CABG.  7.  Gout. 8.  Hypertension.   PAST SURGICAL HISTORY: CABG, pacemaker placement, AICD placement, in remission with surgeries done: prostate seed implants, mastectomy.   SOCIAL HISTORY: No smoking or drinking or illicit drugs.   FAMILY HISTORY: His father had throat cancer. Brother had lung cancer.   ALLERGIES: None.    HOME MEDICATIONS: Medication reconciliation list is not done yet. We will upgrade later.   REVIEW OF SYSTEMS:  CONSTITUTIONAL: The patient denies any fever or chills. No headache or dizziness. No weakness. EYES: No double vision, blurred vision.  EARS, NOSE, AND THROAT: No postnasal drip, slurred speech or dysphagia.  CARDIOVASCULAR: No chest pain, palpitation, orthopnea, or nocturnal dyspnea but has leg edema.   PULMONARY: Positive for cough, sputum, shortness of  breath; no hemoptysis.  GASTROINTESTINAL: No abdominal pain, nausea, vomiting, or diarrhea.  GENITOURINARY: No dysuria, hematuria, or incontinence.  SKIN: No rash or jaundice.  NEUROLOGIC: No syncope, loss of consciousness, or seizure.  ENDOCRINOLOGY: No polyuria, polydipsia, heat or cold intolerance.  HEMATOLOGIC: No easy bruising or bleeding.   PHYSICAL EXAMINATION:  VITAL SIGNS: Temperature 99.3, blood pressure 108/77, pulse 69, oxygen saturation 90% on oxygen 2 L.  GENERAL: The patient is alert, awake, oriented; in no acute distress.  HEENT: Pupils round, equal, reactive to light and accommodation. Moist oral mucosa. Clear oropharynx.  NECK: Supple. No JVD or carotid bruits are noted. No lymphadenopathy. No thyromegaly.   CARDIOVASCULAR: S1, S2. Regular rate and rhythm. No murmurs or gallops.  PULMONARY: Bilateral air entry, bilateral rales with right side weakness.  No use of accessory muscle to breathe.  ABDOMEN: Soft. No distention or tenderness. No organomegaly. Bowel sounds present.  EXTREMITIES: Positive for bilateral leg edema. No clubbing. No cyanosis. No calf tenderness. Bilateral pedal pulses present.  SKIN: No rash or jaundice.  NEUROLOGY: A and O x 3. No focal deficit. Power 5/5. Sensation intact.   LABORATORY DATA: EKG showed a ventricular paced rhythm with occasional PVC at 71 BPM. Chest x-ray showed moderate right and small left pleural effusions with basilar collapse or consolidation, pulmonary vascular congestion versus mild interstitial edema. WBCs 4.9, hemoglobin 10.8, platelets 83,000. Glucose 118, BUN 17, creatinine 1.15, sodium 138, potassium 3.3, chloride 100, bicarbonate 29. Troponin 0.03. BNP 10,968.   IMPRESSIONS:  1.  Acute on chronic systolic congestive heart failure.  2.  Acute respiratory failure  with hypoxemia, with oxygen saturation 88%.  3.  Hypokalemia.  4.  Obstructive sleep apnea.  5.  Hypertension.  6.  Coronary artery disease.   PLAN OF  TREATMENT:  1.  The patient will be admitted to medical floor with telemonitor. For congestive heart failure, since the patient's ejection fraction is 30-35%, in addition to ACE inhibitor, I will add Spirolactone. The patient was on Lasix 80 mg daily, I will increase to 40 mg IV q. 8 hours, start a CHF protocol, monitor urine output. 2.  For hypokalemia, the patient was given potassium in the ED. We need to follow up her BMP and a magnesium level.  4.  For history of atrial fibrillation, status post automatic implantable cardiac defibrillator, the patient is on Eliquis 5 mg t.i.d. We will continue.  5.  Obstructive sleep apnea. The patient needs oxygen at night.  6.  Anemia, stable.  7.  I discussed the patient's condition and plan of treatment with the patient. The patient wants DNR.   TIME SPENT: About 60 minutes.    ____________________________ Demetrios Loll, MD qc:MT D: 11/20/2014 15:43:47 ET T: 11/20/2014 16:31:06 ET JOB#: [id number]  cc: Demetrios Loll, MD, <Dictator> Demetrios Loll MD ELECTRONICALLY SIGNED 11/21/2014 13:15

## 2015-03-29 NOTE — Discharge Summary (Signed)
PATIENT NAME:  Rick Gilmore, Rick Gilmore MR#:  [id number] DATE OF BIRTH:  05-Feb-1941  DATE OF ADMISSION:  11/20/2014 DATE OF DISCHARGE:  11/23/2014    PRIMARY CARE PHYSICIAN:  Deborra Medina, MD   DISCHARGE DIAGNOSES:  1.  Acute on chronic systolic congestive heart failure with ejection fraction of 30% to 35%.  2.  Acute respiratory failure with hypoxemia.  3.  History of atrial fibrillation status post automatic implantable cardiac defibrillator on Eliquis.  4.  Hypertension.  5.  Obstructive sleep apnea.   CONDITION: Stable.   CODE STATUS: DNR.   HOME MEDICATIONS: Please refer to the medication reconciliation list.   The patient is in need of home health and physical therapy with oxygen by nasal cannula, 2 liters p.r.n. and at night.  DIET: Low-sodium, low-fat, low-cholesterol diet.   ACTIVITY: As tolerated.   FOLLOW-UP CARE: Follow with primary care physician within 1 to 2 weeks. The patient also needs follow-up BMP with magnesium level with the primary care physician.   REASON FOR ADMISSION: Shortness of breath for 1 week.   HOSPITAL COURSE: The patient is a 74 year old Caucasian male with a history of congestive heart failure, hypertension, atrial fibrillation presents to the ED with shortness of breath for 1 week.  For a detailed history and physical examination, please refer to the admission note dictated by me. On admission date, the patient's chest x-ray showed moderate right and a small left pleural effusion with bibasilar collapse or consolidation, pulmonary vascular congestion versus mild interstitial edema. The patient's BMP was normal except potassium 3.3, troponin 0.03 and BNP was 10,968.  1.  Acute on chronic systolic congestive heart failure with ejection fraction 30% to 35%. After admission, the patient has been treated with Lasix 40 mg every 8 hours. In addition, spironolactone and ACE inhibitor were due to low ejection fraction.  The patient is on congestive heart failure  protocol, had a good urine output.  The patient's symptoms are getting better, shortness of breath is better.  2.  For hypokalemia, the patient has been treated with potassium supplement. The patient also has a low magnesium which is treated with magnesium supplement.  3.  For history of atrial fibrillation, status post automatic implantable cardiac defibrillator, the patient has been on Eliquis.   4.  The patient also has hypertension, which is controlled. 5.  Obstructive sleep apnea.  The patient is on oxygen at night.    PLAN:  The patient's symptoms have much improved. His vital signs are stable.  He is clinically stable and will be discharged to home with home health and PT today.  I discussed the patient's discharge plan with the patient and nurse.   TIME SPENT: About 37 minutes.     ____________________________ Demetrios Loll, MD qc:DT D: 11/23/2014 12:08:34 ET T: 11/23/2014 14:03:16 ET JOB#: [id number]  cc: Demetrios Loll, MD, <Dictator> Demetrios Loll MD ELECTRONICALLY SIGNED 11/23/2014 15:07

## 2015-03-29 NOTE — H&P (Signed)
PATIENT NAME:  Rick Gilmore, Rick Gilmore MR#:  [id number] DATE OF BIRTH:  29-May-1941  DATE OF ADMISSION:  06/12/2014  ADMITTING PHYSICIAN: Gladstone Lighter, MD.  PRIMARY CARE PHYSICIAN:  Dr. Derrel Nip.   PRIMARY CARDIOLOGIST: Dr. Nehemiah Massed.   CHIEF COMPLAINT: Sent in from Dr. Lupita Dawn office for abnormal blood counts.  HISTORY OF PRESENT ILLNESS: Rick Gilmore is a very pleasant 74 year old Caucasian male with past medical history significant for atrial fibrillation on Coumadin, status post pacemaker, ischemic cardiomyopathy, status post AICD, CAD status post bypass graft surgery, prostate cancer, hypertension, chronic anemia and thrombocytopenia, presents from Dr. Lupita Dawn office with the above-mentioned complaints. The patient said he has been feeling tired and fatigued lately.  He went on cruise ship about 2 weeks ago to the state of [place].  There he had a mechanical fall and hurt his right knee. He went to the Emergency Room, they did an x-ray, did not show any fracture. He was advised to follow up with his PCP. He was started on clindamycin for possible cellulitis changes.  He saw Dr. Derrel Nip in the office today, and she stopped the antibiotics and did some routine blood work in which the INR was greater than 4 and he was also advised to go to the ER because his hemoglobin has dropped.  His hemoglobin here in the ER is  7.5, whereas his baseline hemoglobin seems to be around 10. He denies active bleeding though his Hemoccult was positive with his Coumadin being high. He denies any active diarrhea. He denies any active bleeding, though his stool was Hemoccult positive at this time. He saw Dr. Grayland Ormond for thrombocytopenia last June 2014, at which time his hemoglobin was 9.8 and he received Ion infusion as the work up indicated iron deficiency at the time. The patient had an EGD and colonoscopy in 2010.  The EGD was showing gastritis and esophagitis and the colonoscopy at that time showing polyps, which were benign, and also  diverticulosis. Dr. Grayland Ormond felt that if  patient continues to be anemic, he might need a bone marrow biopsy to rule out MDS.  PRIMARY CARE PHYSICIAN: The patient denies any chest pain or cardiac symptoms at this time.   PAST MEDICAL HISTORY:  1.  Atrial fibrillation on Coumadin.  2.  Sick sinus syndrome, status post pacemaker.  3.  B12 insufficiency.   4.  Prostate cancer.  5.  Ischemic cardiomyopathy status post AICD.  6.  Obstructive sleep apnea.  7.  CAD, status post bypass graft surgery.  8.  Hypertension.  9.  Gout.  PAST SURGICAL HISTORY:  1.  Bypass graft surgery.  2.  Pacemaker placement.  3.  AICD placement and then revision surgeries done. 4.  Prostate seed implants.  5.  Mastectomy done.   ALLERGIES TO MEDICATIONS: NO KNOWN DRUG ALLERGIES.   CURRENT HOME MEDICATIONS:  1.  Allopurinol 300 mg p.o. daily.  2.  Aspirin 81 mg p.o. daily.  3.  Clindamycin 300 mg p.o. t.i.d., which was just stopped today.  4.  Colchicine 0.6 mg p.o. daily.  5.  Cyanocobalamin 1000 mcg intramuscularly monthly injection.  6.  Doxazosin 4 mg p.o. at bedtime.  7.  Lasix 80 mg p.o. daily.  8.  Norco 5/325 mg 1 tablet q. 4 hours p.r.n. 9.  Lisinopril 5 mg p.o. b.i.d.  10.  Toprol 100 mg p.o. daily.  11.  Nexium 40 mg p.o. daily.  12.  Simvastatin 10 mg p.o. daily.  13.  Coumadin 4 mg 4 times a  week and 6 mg the rest of the 3 days a week.   SOCIAL HISTORY: Lives at home by himself, has been pretty functional prior to the fall and right knee effusion. No smoking. The patient denies alcohol use.   FAMILY HISTORY: Father with throat cancer. Brother with lung cancer.  REVIEW OF SYSTEMS:  CONSTITUTIONAL: No fever. Positive for fatigue and weakness.  EYES: No blurred vision, double vision, pain, inflammation or glaucoma. Had status post cataract surgery done.  ENT: No tinnitus, ear pain, hearing loss, epistaxis or discharge.  RESPIRATORY: No cough, wheeze, hemoptysis, or COPD.   CARDIOVASCULAR: No chest pain, orthopnea, edema, arrhythmia, palpitations, or syncope.  GASTROINTESTINAL: No nausea, vomiting, diarrhea, abdominal pain, hematemesis, or melena.  GENITOURINARY: No dysuria, hematochezia, renal calculus, frequency, or incontinence.  ENDOCRINE: No polyuria, nocturia, thyroid problems, heat or cold intolerance.  HEMATOLOGY: Positive for anemia and epistaxis. No easy bruising.  SKIN: No acne, rash or lesions.  MUSCULOSKELETAL: Has a history of gout and arthritis.  NEUROLOGIC: No numbness, weakness, CVA, TIA or seizures.  PSYCHOLOGIC: No anxiety, insomnia, depression.   PHYSICAL EXAMINATION:  VITAL SIGNS: Temperature 97.8 degrees Fahrenheit, pulse 70, respirations 18, blood pressure 102/51, pulse oximetry 98% on room air.  GENERAL: Well-built, well-nourished male, sitting in bed, not in any acute distress.  HEENT: Normocephalic, atraumatic. Pupils equal, round, reacting to light. Anicteric sclerae. Extraocular movements intact.  Oropharynx is clear without erythema, mass or exudates.  NECK: Supple. No thyromegaly, JVD or carotid bruits. No lymphadenopathy.  LUNGS: Moving air bilaterally. No wheeze or crackles. No use of accessory muscles for breathing. CARDIOVASCULAR: S1, S2, regular rate and rhythm. A 3/6 systolic murmur present. No rubs or gallops.  ABDOMEN: Soft, nontender, nondistended. No hepatosplenomegaly. Normal bowel sounds.  EXTREMITIES: Left lower extremity has no pedal edema, 2+ dorsalis pedis pulses palpable.  No clubbing or cyanosis. The right knee is swollen, tender, and erythematous with scabbed wounds on the knee and the lower part of the leg also is warm to touch, erythematous and mildly tender with 1+ edema.  SKIN: No acne, rash or lesions.  LYMPHATICS: No cervical lymphadenopathy.  NEUROLOGIC: Cranial nerves intact. No focal motor or sensory deficits.  PSYCHOLOGICAL: The patient is awake, alert, oriented x 3.   LABORATORY DATA: WBC is 7.0,  hemoglobin 7.7, hematocrit 24.0, platelet count 135,000.   Sodium 135, potassium 5.3, chloride 106, bicarbonate 20, BUN 79, creatinine 2.5, glucose 114, and calcium of 9.1.   Urinalysis negative for any infection.  Right knee x-ray showing soft tissue swelling in the infrapatellar region, mild osteoarthritic changes.  No acute osseous abnormalities noted. INR is 4.3. EKG is paced rhythm.   ASSESSMENT AND PLAN: A 74 year old male with past medical history significant for atrial fibrillation, coronary artery disease, ischemic cardiomyopathy, chronic anemia send in for abnormal laboratory results from primary care physician's office.   1.  Acute on chronic anemia.  Baseline hemoglobin seems to be around now 10, now at 7.7 with no active bleeding. Last EGD in 2010 with esophagitis and gastritis and colonoscopy showing polyps and diverticulosis at the same time.  Recent hematology evaluation last year and they were considering bone marrow biopsy after IV iron infusion. Since he does have history of fine deficiency anemia, we will check iron laboratories.  If hemoglobin is less than 7, we will transfuse.  If not, we can do iron infusion again. Check hemoglobin q. 8 hours, hold the Coumadin at this time. Hold the aspirin,  IV Protonix b.i.d., and  consult hematology. We will hold off on gastrointestinal consultation and will be seen by hematology.  2.  Acute renal failure.   IV fluids has BUN elevated.  It could be pre-renal.  Get a renal ultrasound.  Nephrology has been consulted. Hold Lasix and lisinopril.   3.  Metabolic acidosis and hypokalemia from renal failure likely and continue IV fluids then recheck. If not improving, then we have to consider sodium bicarbonate drip.   4.  Right knee effusion after fall.  X-ray's showing some prepatellar effusion.  Orthopedics has been consulted and Ancef early cellulitis changes.   5.  Atrial fibrillation on Toprol.  Decrease the dose because of the anemia and  low-normal blood pressure.  Hold Coumadin. The patient is already status post pacemaker.  6.  Coronary artery disease on statin and metoprolol. Hold aspirin.   7.  Gout. Continue colchicine.  8.  Deep vein thrombosis prophylaxis, TEDS and SCDs.   9.  CODE STATUS: FULL CODE.   TIME SPENT ON ADMISSION: 50 minutes.     ____________________________ Gladstone Lighter, MD rk:ts D: 06/12/2014 18:23:27 ET T: 06/12/2014 19:08:35 ET JOB#: [id number]  cc: Gladstone Lighter, MD, <Dictator> Deborra Medina, MD Corey Skains, MD Gladstone Lighter MD ELECTRONICALLY SIGNED 06/13/2014 10:50

## 2015-03-29 NOTE — Consult Note (Signed)
PATIENT NAME:  Rick Gilmore, Rick Gilmore MR#:  [id number] DATE OF BIRTH:  12-17-1940  DATE OF CONSULTATION:  06/13/2014  CONSULTING PHYSICIAN:  Timoteo Gaul, MD  REASON FOR CONSULTATION: Right knee pain and swelling status post fall.   HISTORY OF PRESENT ILLNESS: Rick Gilmore is a 74 year old male with a past medical history significant for atrial fibrillation requiring anticoagulation with Coumadin, status post, pacemaker, ischemic cardiomyopathy, status post coronary artery disease with bypass graft and status post AICD with chronic anemia who was admitted for treatment of anemia. The patient recently had a fall 2 weeks ago while vacationing in [place]. He was walking on a sidewalk that was missing some breaks which caused him to trip and fall landing directly on his right knee. He had superficial abrasion and had significant swelling. He was able to ambulate afterwards, but continues to have pain and swelling with limitation of motion in the right knee.   PAST MEDICAL HISTORY: Atrial fibrillation, sick sinus syndrome, status post pacemaker, B12 insufficiency, prostate cancer ischemia, ischemic cardiomyopathy, status post AICD, obstructive sleep apnea, coronary artery disease status post bypass graft, hypertension, gout.   PAST SURGICAL HISTORY: Bypass graft for CAD, pacemaker implantation, AICD implantation with subsequent revision, prostate seed implantation and mastectomy.   MEDICATIONS: Allopurinol, aspirin, clindamycin, colchicine, cyanocobalamine, doxazosin, Lasix, Norco, lisinopril, Toprol, Nexium, simvastatin and Coumadin 4 mg 4 times a week and 6 mg 3 times a week.   ALLERGIES: No known drug allergies.   SOCIAL HISTORY: The patient lives at home by himself. He denies smoking or alcohol use.   PHYSICAL EXAMINATION:  RIGHT KNEE: The patient has a superficial abrasion which is healing with an eschar. There is no drainage around the eschar. The patient has prepatellar swelling with faint erythema  consistent with underlying hematoma. He can actively straight leg raise indicating that the extensor mechanism is intact. He has no palpable defect of the quadriceps tendon,  or the patella tendon. He has tenderness over the prepatellar bursa, but does not have tenderness over the medial and lateral joint lines. He has no calf tenderness or lower leg edema. Leg and thigh compartments are soft and compressible. He has palpable pedal pulses, intact sensation to light touch, and 5/5 strength of the right lower extremity.   RADIOLOGY: X-rays of the right knee demonstrate no evidence of fracture or dislocation. He does have degenerative changes.   ASSESSMENT: Right knee prepatellar hematoma.   PLAN: I recommended that Rick Gilmore receive warm compresses to mobilize the hematoma. I  also recommended physical therapy evaluation. He may weightbear as tolerated on the right lower extremity. If he is unable to participate with physical therapy due to pain, a CT scan would be considered, given that he is not a candidate for an MRI given his pacemaker. The patient and his daughter, who was with him at the bedside, understood and agreed with this plan.    ____________________________ Timoteo Gaul, MD klk:ds D: 06/15/2014 16:10:00 ET T: 06/15/2014 16:33:23 ET JOB#: [id number]  cc: Timoteo Gaul, MD, <Dictator> Timoteo Gaul MD ELECTRONICALLY SIGNED 06/15/2014 17:52

## 2015-03-29 NOTE — Consult Note (Signed)
Brief Consult Note: Diagnosis: Right knee pre-patella hematoma.   Patient was seen by consultant.   Consult note dictated.   Orders entered.   Comments: Patient fell onto right knee.  On coumadin.  Had abrasion and swelling.  Painful to walk on.  xrays negative for fracture.  Can actively extend and flex knee without significant pain.  Feels tightness with knee motion.  No signs of infection or septic knee.  Recommend warm compresses and PT evaluation.  Orders entered.  Electronic Signatures: Thornton Park (MD)  (Signed 09-Jul-15 17:46)  Authored: Brief Consult Note   Last Updated: 09-Jul-15 17:46 by Thornton Park (MD)

## 2015-03-29 NOTE — Discharge Summary (Signed)
PATIENT NAME:  Rick Gilmore, Rick Gilmore MR#:  [id number] DATE OF BIRTH:  [birth year]  DATE OF ADMISSION:  06/12/2014. DATE OF DISCHARGE:  06/17/2014.  PRIMARY CARE PHYSICIAN: Dr. Derrel Nip.  CARDIOLOGIST: Dr. Nehemiah Massed.   HEMATOLOGIST:  Dr. Grayland Ormond.   FINAL DIAGNOSES:  1.  Acute on chronic anemia, hematoma and effusion the right knee.  2.  Acute renal failure, which improved; metabolic acidosis improving.  3.  Fluid overload, chronic systolic congestive heart failure.  4.  Atrial fibrillation and coagulopathy with Coumadin.  5.  History of coronary artery disease.  6.  Gout.   MEDICATIONS ON DISCHARGE: Include: Lasix 80 mg once a day in the morning, Nexium 40 mg once a day in the morning, metoprolol 100 mg once a day at bedtime, vitamins B12 injectable 1000 mcg/mL 1 dose injectable once a month, doxazosin 4 mg daily, colchicine 0.6 mg daily, lisinopril 10 mg daily, simvastatin 20 mg 0.5 tablet at bedtime, Percocet 5/325 one tablet every four hours as needed for pain, spironolactone 25 mg once a day, allopurinol 100 mg once a day, Eliquis 5 mg 1 tablet twice a day, nystatin swish and swallow 100,000 units/mL 5 mL every 6 hours for 5 days, cephalexin 250 mg 1 capsule every 8 hours for 5 days. Iron polysaccharide 150 mg 1 capsule daily, lactobacillus 1 capsule twice a day for 14 days. Stop taking aspirin, stop taking warfarin.   DIET: Low sodium diet, regular consistency.   ACTIVITY: As tolerated. Follow up with physical therapy at rehabilitation. Follow up with Dr. Grayland Ormond in 2 to 3 weeks, with Dr. Nehemiah Massed 2 to 3 weeks, with doctor at rehabilitation in 1 to 2 days. I recommend checking weekly hemoglobin and BMP.   HOSPITAL COURSE: The patient was admitted 06/12/2014 and discharged 06/17/2014. The patient was sent in for abnormal blood counts. The patient did have a mechanical fall and hurt his right knee.  Hemoglobin in the ER was 7.5. Previous hemoglobin was 9.8 in the past. The patient was admitted with  acute on chronic anemia, hematoma of the right knee. His aspirin and Coumadin were held. For his acute renal failure he was given IV fluid hydration and Lasix and lisinopril were initially held for his metabolic acidosis, likely secondary to the acute renal failure. For his atrial fibrillation the Coumadin was on hold.   LABORATORY AND RADIOLOGICAL DATA DURING THE HOSPITAL COURSE: Included:  An LDH of 628, folic acid 36.6, ferritin 440, iron saturation 10%, iron serum 33, TIBC 315, INR 4.3. Glucose 114, BUN 79, creatinine 2.5, sodium 135, potassium 5.3, chloride 106, CO2 20. White blood cell count 7.0, H and H 7.7 and 24.0, platelet count 135.   Urinalysis was negative.   Right knee x-ray showed soft tissue swelling in the infrapatellar region. No acute osseous formation.   EKG: Paced, 60 beats per minute.   Haptoglobin 89, vitamin B12 1509. Hemoglobin dipped down to 7.1 on the 8th. INR was still elevated at 4.3. On the 9th, creatinine down to 2.12, potassium 5.6.   Ultrasound of the kidneys, study within normal limits.   Occult blood negative. Free kappa and lambda light chains, kappa/lambda ratio high at 1.9. Free kappa light chains high at 32.29. Creatinine on the 10th 1.66, hemoglobin 7.6.   Chest x-ray on the 10th showed cardiac enlargement with vascular congestion and mild perihilar interstitial edema.   Hemoglobin on the 11th 8.4, platelet count 96,000, INR 3.1, creatinine 1.38.   Echocardiogram showed ejection fraction 30% to 35%, severely increased  left ventricular internal cavity size, severely enlarged right ventricle, left atrium, and right atrium. Tricuspid regurgitation, mild to moderate mitral valve regurgitation, severely elevated pulmonary arterial systolic pressure.   CT scan of the right knee without contrast showed prominent subcutaneous hematoma. No internal derangement or fracture of the right knee. Chondromalacia of the patellofemoral compartment with tiny joint effusion.    Creatinine upon discharge 1.41, potassium 4.6, INR upon discharge 2.3, hemoglobin 8.2.   HOSPITAL COURSE BY PROBLEM LIST:  1.  Acute on chronic anemia: I think the patient bled into the knee with the hematoma and that could be the cause of the patient's blood loss; he was guaiac-negative. He does get iron infusions as an outpatient so that is probably why his ferritin is high. I will continue iron orally upon discharge, hemoglobin 8.2 and stable for the last 3 days. No active bleeding. Followup with Dr. Grayland Ormond as an outpatient. Recommend checking a hemoglobin weekly at this point. The patient can follow up in the [place] for transfusions if needed. Looking back through Dr. Gary Fleet notes, he was considering a bone marrow biopsy for myelodysplastic syndrome since a few cell lines were low. I think part of the issue was that his Coumadin level was high. We held his Coumadin the entire hospital stay.  2.  Acute renal failure: Improved with IV fluid hydration. Metabolic acidosis improved with IV fluid hydration. Initially his ACE inhibitor and spironolactone were stopped. Creatinine did improve down to 1.41. The patient does have chronic kidney disease. Creatinine must be watched with diuretics.  3.  Fluid overload: From getting IV fluids on presentation and 2 units of blood during the hospitalization. The patient does have chronic systolic congestive heart failure with cardiomyopathy. We did give the patient Lasix IV and restarted his oral Lasix and this improved the patient's shortness of breath tremendously.  4.  Atrial fibrillation with coagulopathy:  On Coumadin. His Coumadin was held the entire hospitalization. INR upon discharge was 2.3. I spoke with Dr. Nehemiah Massed. Will restart Eliquis as and outpatient at rehabilitation, hopefully tomorrow, and will give a small dose of oral vitamin K today to ensure that the INR is down tomorrow and restart Eliquis without an issue. He is heart-rate  controlled on Toprol, also has a pacemaker.  5.  History of coronary artery disease: I stopped the aspirin. He is on Toprol. No symptoms of chest pain during the hospitalization.  6.  Gout. I did decrease the dose of his allopurinol. Close clinical followup as outpatient is needed.   Time spent on discharge: 35 minutes.    ____________________________ Tana Conch. Leslye Peer, MD rjw:lt D: 06/17/2014 09:05:28 ET T: 06/17/2014 09:44:32 ET JOB#: [id number]  cc: Tana Conch. Leslye Peer, MD, <Dictator> Deborra Medina, MD Corey Skains, MD Kathlene November. Grayland Ormond, MD Marisue Brooklyn MD ELECTRONICALLY SIGNED 06/19/2014 15:18

## 2015-03-29 NOTE — Discharge Summary (Signed)
PATIENT NAME:  Rick Gilmore, Rick Gilmore MR#:  [id number] DATE OF BIRTH:  [birth year]  DATE OF ADMISSION:  06/12/2014 DATE OF DISCHARGE:    Addendum:  I am going to stop the spironolactone for right now and the Toprol-XL dose is actually 50 mg once a day in the morning rather than the evening.     ____________________________ Tana Conch. Leslye Peer, MD rjw:dmm D: 06/17/2014 09:09:13 ET T: 06/17/2014 09:38:55 ET JOB#: [id number]  cc: Tana Conch. Leslye Peer, MD, <Dictator> Marisue Brooklyn MD ELECTRONICALLY SIGNED 06/19/2014 15:18

## 2015-04-01 DIAGNOSIS — S81812A Laceration without foreign body, left lower leg, initial encounter: Secondary | ICD-10-CM | POA: Diagnosis not present

## 2015-04-01 DIAGNOSIS — L97222 Non-pressure chronic ulcer of left calf with fat layer exposed: Secondary | ICD-10-CM | POA: Diagnosis not present

## 2015-04-07 ENCOUNTER — Telehealth: Payer: Self-pay | Admitting: Internal Medicine

## 2015-04-07 NOTE — Telephone Encounter (Signed)
Pt dropped off request of rx for [facility]. Letter in Dr. Lupita Dawn box/msn

## 2015-04-07 NOTE — Telephone Encounter (Signed)
Placed in red folder  

## 2015-04-08 ENCOUNTER — Encounter: Payer: Medicare Other | Attending: Surgery | Admitting: Surgery

## 2015-04-08 ENCOUNTER — Other Ambulatory Visit: Payer: Self-pay | Admitting: Internal Medicine

## 2015-04-08 DIAGNOSIS — J449 Chronic obstructive pulmonary disease, unspecified: Secondary | ICD-10-CM | POA: Diagnosis not present

## 2015-04-08 DIAGNOSIS — Z9989 Dependence on other enabling machines and devices: Principal | ICD-10-CM

## 2015-04-08 DIAGNOSIS — R600 Localized edema: Secondary | ICD-10-CM | POA: Insufficient documentation

## 2015-04-08 DIAGNOSIS — G4733 Obstructive sleep apnea (adult) (pediatric): Secondary | ICD-10-CM

## 2015-04-08 DIAGNOSIS — L97222 Non-pressure chronic ulcer of left calf with fat layer exposed: Secondary | ICD-10-CM | POA: Insufficient documentation

## 2015-04-08 DIAGNOSIS — S81812A Laceration without foreign body, left lower leg, initial encounter: Secondary | ICD-10-CM | POA: Diagnosis not present

## 2015-04-08 DIAGNOSIS — I10 Essential (primary) hypertension: Secondary | ICD-10-CM | POA: Diagnosis not present

## 2015-04-08 DIAGNOSIS — G4730 Sleep apnea, unspecified: Secondary | ICD-10-CM | POA: Insufficient documentation

## 2015-04-08 DIAGNOSIS — I2510 Atherosclerotic heart disease of native coronary artery without angina pectoris: Secondary | ICD-10-CM | POA: Insufficient documentation

## 2015-04-08 DIAGNOSIS — X58XXXA Exposure to other specified factors, initial encounter: Secondary | ICD-10-CM | POA: Diagnosis not present

## 2015-04-08 DIAGNOSIS — L97221 Non-pressure chronic ulcer of left calf limited to breakdown of skin: Secondary | ICD-10-CM | POA: Diagnosis not present

## 2015-04-08 NOTE — Progress Notes (Signed)
Rick Gilmore, Rick Gilmore ([id number]) Visit Report for 04/08/2015 Chief Complaint Document Details Patient Name: Rick Gilmore, Rick Gilmore Date of Service: 04/08/2015 8:00 AM Medical Record Number: [id number] Patient Account Number: [id number] Date of Birth/Sex: October 10, 1941 (74 y.o. Male) Treating RN: Primary Care Physician: Deborra Medina Other Clinician: Referring Physician: Deborra Medina Treating Physician/Extender: [name] in Treatment: 3 Information Obtained from: Patient Chief Complaint Patient presents to the wound care center for a consult due non healing wound 74 year old gentleman comes with a history of having a injury to his left lower extremity on 03/04/15,which was seen in the ER about 10 days ago. Electronic Signature(s) Signed: 04/08/2015 1:44:13 PM By: [name] MD, FACS Entered By: [name] on 04/08/2015 08:41:29 Rick Gilmore ([id number]) -------------------------------------------------------------------------------- Debridement Details Patient Name: Rick Gilmore Date of Service: 04/08/2015 8:00 AM Medical Record Number: [id number] Patient Account Number: [id number] Date of Birth/Sex: [birth year] (74 y.o. Male) Treating RN: Primary Care Physician: Deborra Medina Other Clinician: Referring Physician: Deborra Medina Treating Physician/Extender: [name] in Treatment: 3 Debridement Performed for Wound #1 Left Lower Leg Assessment: Performed By: Physician [name], MD Debridement: Open Wound/Selective Debridement Selective Description: Pre-procedure Yes Verification/Time Out Taken: Start Time: 08:34 Pain Control: Lidocaine 4% Topical Solution Level: Non-Viable Tissue Total Area Debrided (L x 2.1 (cm) x 1.9 (cm) = 3.99 (cm) W): Tissue and other Non-Viable, Blood Clots, Exudate, Fibrin/Slough material debrided: Instrument: Forceps Bleeding: Minimum Hemostasis Achieved: Pressure End Time: 08:36 Procedural Pain: 0 Post Procedural Pain:  0 Response to Treatment: Procedure was tolerated well Post Debridement Measurements of Total Wound Length: (cm) 2.1 Width: (cm) 1.9 Depth: (cm) 0.6 Volume: (cm) 1.88 Electronic Signature(s) Signed: 04/08/2015 1:44:13 PM By: [name] MD, FACS Entered By: [name] on 04/08/2015 08:40:04 Rick Gilmore ([id number]) -------------------------------------------------------------------------------- HPI Details Patient Name: Rick Gilmore Date of Service: 04/08/2015 8:00 AM Medical Record Number: [id number] Patient Account Number: [id number] Date of Birth/Sex: 05-21-1941 (74 y.o. Male) Treating RN: Primary Care Physician: Deborra Medina Other Clinician: Referring Physician: Deborra Medina Treating Physician/Extender: [name] in Treatment: 3 History of Present Illness Location: left lower extremity Quality: Patient reports experiencing a dull pain to affected area(s). Severity: Patient states wound (s) are getting better. Duration: Patient has had the wound for < 2 weeks prior to presenting for treatment Timing: Pain in wound is Intermittent (comes and goes Context: The wound occurred when the patient after he had a fall and hurt himself. Modifying Factors: Patient must stand for long periods while working Associated Signs and Symptoms: Patient reports presence of swelling HPI Description: 74 year old gentleman who has a past medical history significant for gout, edema of the lower limbs, anemia, hypertension, acid reflux, depression, COPD, coronary artery disease and history of sleep apnea. He has had a bypass surgery in 1988 and has had prostate cancer with radiation and seed implants in 2002. He is also known to have a pacemaker and defibrillator placed in 2014. he has been seen in the ER on 3 different occasions since the fall and they have been applying a dry dressing and some labs were done the last time around. No x-rays or ultrasound has been done. The patient is  on an anticoagulant and this has not been held at any stage. During his ER notes from 03/10/2015 is noted that he had a wound on the left lower extremity which she's had for about 10 days now. The patient was noted to have extensive ecchymosis and tenderness and a laceration to the left lower extremity. Note is made of  the fact that he had no signs of cellulitis and his labs were normal. He was given symptomatic treatment and asked to follow-up in the wound center. 04/74/2016 -- he has been doing fine and his dressing has been done by his family members and is at no issues. His treatment the blood thinner continuous. He will be going out of town this week and will be able to see as next week on Tuesday. Electronic Signature(s) Signed: 04/08/2015 1:44:13 PM By: [name] MD, FACS Entered By: [name] on 04/08/2015 08:41:35 Rick Gilmore, Rick Gilmore ([id number]) -------------------------------------------------------------------------------- Physical Exam Details Patient Name: Rick Gilmore Date of Service: 04/08/2015 8:00 AM Medical Record Number: [id number] Patient Account Number: [id number] Date of Birth/Sex: July 23, 1941 (74 y.o. Male) Treating RN: Primary Care Physician: Deborra Medina Other Clinician: Referring Physician: Deborra Medina Treating Physician/Extender: [name] in Treatment: 3 Constitutional . Pulse regular. Respirations normal and unlabored. Afebrile. . Eyes Nonicteric. Reactive to light. Ears, Nose, Mouth, and Throat Lips, teeth, and gums WNL.Marland Kitchen Moist mucosa without lesions . Neck supple and nontender. No palpable supraclavicular or cervical adenopathy. Normal sized without goiter. Respiratory WNL. No retractions.. Cardiovascular Pedal Pulses WNL. No clubbing, cyanosis or edema. Integumentary (Hair, Skin) after debriding some of the necrotic tissue there are 2 areas of undermining one between the 6 and 8:00 position and the other between the 10 and 11:00  position. The undermining is approximately 1 cm.. Psychiatric Judgement and insight Intact.. No evidence of depression, anxiety, or agitation.. Electronic Signature(s) Signed: 04/08/2015 1:44:13 PM By: [name] MD, FACS Entered By: [name] on 04/08/2015 08:42:47 Rick Gilmore, Rick Gilmore ([id number]) -------------------------------------------------------------------------------- Physician Orders Details Patient Name: Rick Gilmore Date of Service: 04/08/2015 8:00 AM Medical Record Number: [id number] Patient Account Number: [id number] Date of Birth/Sex: [birth year] (74 y.o. Male) Treating RN: [name] Primary Care Physician: Deborra Medina Other Clinician: Referring Physician: Deborra Medina Treating Physician/Extender: [name] in Treatment: 3 Verbal / Phone Orders: Yes Clinician: [name] Read Back and Verified: Yes Diagnosis Coding Wound Cleansing Wound #1 Left Lower Leg o Clean wound with Normal Saline. Anesthetic Wound #1 Left Lower Leg o Topical Lidocaine 4% cream applied to wound bed prior to debridement Primary Wound Dressing Wound #1 Left Lower Leg o Aquacel Ag - pack undermining with silver alginate rope Secondary Dressing Wound #1 Left Lower Leg o Boardered Foam Dressing Dressing Change Frequency Wound #1 Left Lower Leg o Change dressing every other day. Follow-up Appointments Wound #1 Left Lower Leg o Return Appointment in 1 week. Additional Orders / Instructions Wound #1 Left Lower Leg o Increase protein intake. o Activity as tolerated Electronic Signature(s) Signed: 04/08/2015 1:44:13 PM By: [name] MD, FACS Signed: 04/08/2015 4:57:42 PM By: [name] RN Entered By: [name] on 04/08/2015 08:39:54 [id number] [place] ([id number]) Rick Gilmore, Rick Gilmore ([id number]) -------------------------------------------------------------------------------- Problem List Details Patient Name: Rick Gilmore Date of Service: 04/08/2015 8:00  AM Medical Record Number: [id number] Patient Account Number: [id number] Date of Birth/Sex: 06-04-1941 (74 y.o. 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 Male) Treating RN: Primary Care Physician: Deborra Medina Other Clinician: Referring Physician: Deborra Medina Treating Physician/Extender: [name] in Treatment: 3 Subjective Chief Complaint Information obtained from Patient Patient presents to the wound care center for a consult due non healing wound 74 year old gentleman comes with a history of having a injury to his left lower extremity on 03/04/15,which was seen in the ER about 10 days ago. History of Present Illness (HPI) The following HPI elements were documented for the patient's wound: Location: left lower extremity Quality: Patient reports experiencing a dull pain to affected area(s). Severity: Patient states wound (s) are getting better. Duration: Patient has had the wound for < 2 weeks prior to presenting for treatment Timing: Pain in wound is Intermittent (comes and goes Context: The wound occurred when the patient after he  had a fall and hurt himself. Modifying Factors: Patient must stand for long periods while working Associated Signs and Symptoms: Patient reports presence of swelling 74 year old gentleman who has a past medical history significant for gout, edema of the lower limbs, anemia, hypertension, acid reflux, depression, COPD, coronary artery disease and history of sleep apnea. He has had a bypass surgery in 1988 and has had prostate cancer with radiation and seed implants in 2002. He is also known to have a pacemaker and defibrillator placed in 2014. he has been seen in the ER on 3 different occasions since the fall and they have been applying a dry dressing and some labs were done the last time around. No x-rays or ultrasound has been done. The patient is on an anticoagulant and this has not been held at any stage. During his ER notes from 03/10/2015 is noted that he had a wound on the left lower extremity which she's had for about 10 days now. The patient was noted to have extensive ecchymosis and tenderness and a laceration to the left lower extremity. Note is made of the fact that he had no signs of cellulitis and his labs were normal. He was given symptomatic treatment and asked to follow-up in the wound center. 04/74/2016 -- he has been doing fine and his dressing has been done by his family members and is at no issues. His treatment the blood thinner continuous. He will be going out of town this week and will be able to see as next week on Tuesday. Rick Gilmore, Rick Gilmore ([id number]) Objective Constitutional Pulse regular. Respirations normal and unlabored. Afebrile. Vitals Time Taken: 8:13 AM, Height: 75 in, Weight: 252 lbs, BMI: 31.5, Temperature: 97.7 F, Pulse: 75 bpm, Respiratory Rate: 18 breaths/min, Blood Pressure: 103/54 mmHg. Eyes Nonicteric. Reactive to light. Ears, Nose, Mouth, and Throat Lips, teeth, and gums WNL.Marland Kitchen Moist mucosa without lesions . Neck supple and nontender. No palpable  supraclavicular or cervical adenopathy. Normal sized without goiter. Respiratory WNL. No retractions.. Cardiovascular Pedal Pulses WNL. No clubbing, cyanosis or edema. Psychiatric Judgement and insight Intact.. No evidence of depression, anxiety, or agitation.. Integumentary (Hair, Skin) after debriding some of the necrotic tissue there are 2 areas of undermining one between the 6 and 8:00 position and the other between the 10 and 11:00 position. The undermining is approximately 1 cm.. Wound #1 status is Open. Original cause of wound was Trauma. The wound is located on the Left Lower Leg. The wound measures 2.1cm length x 1.9cm width x 0.6cm depth; 3.134cm^2 area and 1.88cm^3 volume. The wound is limited to skin breakdown.  Tunneling has been noted at 11:00 with a maximum distance of 1cm. There is additional tunneling and at :00. Undermining begins at 6:00 and ends at 9:00 with a maximum distance of 0.8cm. There is a medium amount of serosanguineous drainage noted. The wound margin is indistinct and nonvisible. There is no granulation within the wound bed. There is a large (67- 100%) amount of necrotic tissue within the wound bed including Adherent Slough. The periwound skin appearance had no abnormalities noted for moisture. The periwound skin appearance exhibited: Induration, Localized Edema, Hemosiderin Staining, Erythema. The periwound skin appearance did not exhibit: Callus, Crepitus, Excoriation, Fluctuance, Friable, Rash, Scarring. The surrounding wound skin color is noted with erythema. Periwound temperature was noted as No Abnormality. Rick Gilmore, Rick Gilmore ([id number]) Assessment Active Problems ICD-10 [phone] - Non-pressure chronic ulcer of left calf with fat layer exposed S81.812A - Laceration without foreign body, left lower leg, initial encounter Diagnoses ICD-10 L97.222: Non-pressure chronic ulcer of left calf with fat layer exposed S81.812A: Laceration without foreign body, left  lower leg, initial encounter after removing some clot and debris I was able to establish a couple of undermining areas which will be packed with silver alginate rope. he has been instructed in wound care and will see [place] back next week. Procedures Wound #1 Wound #1 is a Trauma, Other located on the Left Lower Leg . There was a Non-Viable Tissue Open Wound/Selective [phone]) debridement with total area of 3.99 sq cm performed by Naaman Curro, Jackson Latino., MD. with the following instrument(s): Forceps to remove Non-Viable tissue/material including Blood Clots, Exudate, and Fibrin/Slough after achieving pain control using Lidocaine 4% Topical Solution. A time out was conducted prior to the start of the procedure. A Minimum amount of bleeding was controlled with Pressure. The procedure was tolerated well with a pain level of 0 throughout and a pain level of 0 following the procedure. Post Debridement Measurements: 2.1cm length x 1.9cm width x 0.6cm depth; 1.88cm^3 volume. Plan Wound Cleansing: Wound #1 Left Lower Leg: Clean wound with Normal Saline. Anesthetic: Wound #1 Left Lower Leg: Topical Lidocaine 4% cream applied to wound bed prior to debridement Primary Wound Dressing: Wound #1 Left Lower Leg: Aquacel Ag - pack undermining with silver alginate rope Rick Gilmore, Rick Gilmore ([id number]) Secondary Dressing: Wound #1 Left Lower Leg: Boardered Foam Dressing Dressing Change Frequency: Wound #1 Left Lower Leg: Change dressing every other day. Follow-up Appointments: Wound #1 Left Lower Leg: Return Appointment in 1 week. Additional Orders / Instructions: Wound #1 Left Lower Leg: Increase protein intake. Activity as tolerated Follow-Up Appointments: A Patient Clinical Summary of Care was provided to [facility] After removing some clot and debris I was able to establish a couple of undermining areas which will be packed with silver alginate rope. He has been instructed in wound care and will see [place] back  next week. Electronic Signature(s) Signed: 04/08/2015 11:48:10 AM By: [name] RN Signed: 04/08/2015 1:44:13 PM By: [name] MD, FACS Entered By: [name] on 04/08/2015 11:48:09

## 2015-04-08 NOTE — Telephone Encounter (Signed)
Patient notified and order faxed as requested.

## 2015-04-08 NOTE — Telephone Encounter (Signed)
ORDER MADE THIS AM

## 2015-04-09 NOTE — Progress Notes (Signed)
Rick Gilmore, Rick Gilmore ([id number]) Visit Report for 04/08/2015 Arrival Information Details Patient Name: Rick Gilmore, Rick Gilmore Date of Service: 04/08/2015 8:00 AM Medical Record Number: [id number] Patient Account Number: [id number] Date of Birth/Sex: [birth year] (74 y.o. Male) Treating RN: Cornell Barman Primary Care Physician: Deborra Medina Other Clinician: Referring Physician: Deborra Medina Treating Physician/Extender: [name] in Treatment: 3 Visit Information History Since Last Visit Added or deleted any medications: No Patient Arrived: Ambulatory Any new allergies or adverse reactions: No Arrival Time: 08:13 Had a fall or experienced change in No Accompanied By: self activities of daily living that may affect Transfer Assistance: None risk of falls: Patient Identification Verified: Yes Signs or symptoms of abuse/neglect since last No Secondary Verification Process Yes visito Completed: Hospitalized since last visit: No Patient Requires Transmission- No Has Dressing in Place as Prescribed: Yes Based Precautions: Pain Present Now: No Patient Has Alerts: Yes Patient Alerts: Patient on Blood Thinner Electronic Signature(s) Signed: 04/08/2015 9:56:11 PM By: Gretta Cool, RN, BSN, Kim RN, BSN Entered By: Gretta Cool, RN, BSN, Kim on 04/08/2015 08:13:41 Rick Gilmore ([id number]) -------------------------------------------------------------------------------- Encounter Discharge Information Details Patient Name: Rick Gilmore Date of Service: 04/08/2015 8:00 AM Medical Record Number: [id number] Patient Account Number: [id number] Date of Birth/Sex: 05-07-1941 (74 y.o. Male) Treating RN: Primary Care Physician: Deborra Medina Other Clinician: Referring Physician: Deborra Medina Treating Physician/Extender: [name] in Treatment: 3 Encounter Discharge Information Items Schedule Follow-up Appointment: No Medication Reconciliation completed No and provided to Patient/Care Taygan Connell: Provided  on Clinical Summary of Care: 04/08/2015 Form Type Recipient Paper Patient [facility] Electronic Signature(s) Signed: 04/08/2015 8:50:52 AM By: Ruthine Dose Entered By: Ruthine Dose on 04/08/2015 08:50:52 Rick Gilmore ([id number]) -------------------------------------------------------------------------------- Lower Extremity Assessment Details Patient Name: Rick Gilmore Date of Service: 04/08/2015 8:00 AM Medical Record Number: [id number] Patient Account Number: [id number] Date of Birth/Sex: 06-25-1941 (74 y.o. Male) Treating RN: Cornell Barman Primary Care Physician: Deborra Medina Other Clinician: Referring Physician: Deborra Medina Treating Physician/Extender: [name] in Treatment: 3 Edema Assessment Assessed: Shirlyn Goltz: No] [Right: No] E[Left: dema] [Right: :] Calf Left: Right: Point of Measurement: 33 cm From Medial Instep 41.2 cm cm Ankle Left: Right: Point of Measurement: 11 cm From Medial Instep 24 cm cm Vascular Assessment Pulses: Posterior Tibial Palpable: [Left:Yes] Dorsalis Pedis Palpable: [Left:Yes] Extremity colors, hair growth, and conditions: Extremity Color: [Left:Normal] Hair Growth on Extremity: [Left:Yes] Temperature of Extremity: [Left:Warm] Capillary Refill: [Left:< 3 seconds] Toe Nail Assessment Left: Right: Thick: No Discolored: No Deformed: No Improper Length and Hygiene: No Electronic Signature(s) Signed: 04/08/2015 9:56:11 PM By: Gretta Cool, RN, BSN, Kim RN, BSN Entered By: Gretta Cool, RN, BSN, Kim on 04/08/2015 [address], [place] ([id number]) Rick Gilmore, Rick Gilmore ([id number]) -------------------------------------------------------------------------------- Multi Wound Chart Details Patient Name: Rick Gilmore Date of Service: 04/08/2015 8:00 AM Medical Record Number: [id number] Patient Account Number: [id number] Date of Birth/Sex: 05-03-1941 (74 y.o. Male) Treating RN: [name] Primary Care Physician: Deborra Medina Other Clinician: Referring  Physician: Deborra Medina Treating Physician/Extender: [name] in Treatment: 3 Vital Signs Height(in): 75 Pulse(bpm): 75 Weight(lbs): 252 Blood Pressure 103/54 (mmHg): Body Mass Index(BMI): 31 Temperature(F): 97.7 Respiratory Rate 18 (breaths/min): Photos: [1:No Photos] [[name]:[name]] Wound Location: [1:Left Lower Leg] [[name]:[name]] Wounding Event: [1:Trauma] [[name]:[name]] Primary Etiology: [1:Trauma, Other] [[name]:[name]] Comorbid History: [1:Anemia, Chronic Obstructive Pulmonary Disease (COPD), Sleep Apnea, Arrhythmia, Congestive Heart Failure, Coronary Artery Disease, Hypertension, Myocardial Infarction, Gout, Received Radiation] [[name]:[name]] Date Acquired: [1:03/04/2015] [[name]:[name]] Weeks of Treatment: [1:3] [[name]:[name]] Wound Status: [1:Open] [[name]:[name]] Measurements L x W x D 2.1x1.9x0.6 [[name]:[name]] (cm) Area (cm) : [1:3.134] [[name]:[name]] Volume (  cm) : [1:1.88] [[name]:[name]] % Reduction in Area: [1:-315.60%] [[name]:[name]] % Reduction in Volume: -1145.00% [[name]:[name]] Classification: [1:Full Thickness Without Exposed Support Structures] [[name]:[name]] Exudate Amount: [1:Medium] [[name]:[name]] Exudate Type: [1:Serosanguineous] [[name]:[name]] Exudate Color: [1:red, brown] [[name]:[name]] Wound Margin: [1:Indistinct, nonvisible] [[name]:[name]] Granulation Amount: [1:None Present (0%)] [[name]:[name]] Necrotic Amount: Large (67-100%) [name] [name] Exposed Structures: Fascia: No [name] [name] Fat: No Tendon: No Muscle: No Joint: No Bone: No Limited to Skin Breakdown Epithelialization: None [name] [name] Periwound Skin Texture: Edema: Yes [name] [name] Induration: Yes Excoriation: No Callus: No Crepitus: No Fluctuance: No Friable: No Rash: No Scarring: No Periwound Skin No Abnormalities Noted [name] [name] Moisture: Periwound Skin Color: Erythema: Yes [name] [name] Hemosiderin Staining: Yes Temperature: No Abnormality [name] [name] Tenderness on No [name] [name] Palpation: Wound Preparation: Ulcer Cleansing: [name] [name] Rinsed/Irrigated with Saline Topical  Anesthetic Applied: Other: 4% lidocaine cream Treatment Notes Electronic Signature(s) Signed: 04/08/2015 4:57:42 PM By: [name] RN Entered By: [name] on 04/08/2015 08:27:20 Rick Gilmore ([id number]) -------------------------------------------------------------------------------- Multi-Disciplinary Care Plan Details Patient Name: Rick Gilmore Date of Service: 04/08/2015 8:00 AM Medical Record Number: [id number] Patient Account Number: [id number] Date of Birth/Sex: 06/08/1941 (74 y.o. Male) Treating RN: [name] Primary Care Physician: Deborra Medina Other Clinician: Referring Physician: Deborra Medina Treating Physician/Extender: [name] in Treatment: 3 Active Inactive Abuse / Safety / Falls / Self Care Management Nursing Diagnoses: Potential for falls Goals: Patient will remain injury free Date Initiated: 03/17/2015 Goal Status: Active Interventions: Assess fall risk on admission and as needed Notes: Necrotic Tissue Nursing Diagnoses: Impaired tissue integrity related to necrotic/devitalized tissue Goals: Necrotic/devitalized tissue will be minimized in the wound bed Date Initiated: 03/17/2015 Goal Status: Active Interventions: Assess patient pain level pre-, during and post procedure and prior to discharge Treatment Activities: Apply topical anesthetic as ordered : 04/08/2015 Notes: Orientation to the Wound Care Program Nursing Diagnoses: Knowledge deficit related to the wound healing center program Rick Gilmore, Rick Gilmore ([id number]) Goals: Patient/caregiver will verbalize understanding of the [place] Date Initiated: 03/17/2015 Goal Status: Active Interventions: Provide education on orientation to the wound center Notes: Wound/Skin Impairment Nursing Diagnoses: Impaired tissue integrity Goals: Patient/caregiver will verbalize understanding of skin care regimen Date Initiated: 03/17/2015 Goal Status: Active Ulcer/skin  breakdown will heal within 14 weeks Date Initiated: 03/17/2015 Goal Status: Active Interventions: Assess patient/caregiver ability to obtain necessary supplies Notes: Electronic Signature(s) Signed: 04/08/2015 4:57:42 PM By: [name] RN Entered By: [name] on 04/08/2015 08:26:55 Rick Gilmore ([id number]) -------------------------------------------------------------------------------- Pain Assessment Details Patient Name: Rick Gilmore Date of Service: 04/08/2015 8:00 AM Medical Record Number: [id number] Patient Account Number: [id number] Date of Birth/Sex: 1940-12-24 (74 y.o. Male) Treating RN: Cornell Barman Primary Care Physician: Deborra Medina Other Clinician: Referring Physician: Deborra Medina Treating Physician/Extender: [name] in Treatment: 3 Active Problems Location of Pain Severity and Description of Pain Patient Has Paino No Site Locations Pain Management and Medication Current Pain Management: Electronic Signature(s) Signed: 04/08/2015 9:56:11 PM By: Gretta Cool, RN, BSN, Kim RN, BSN Entered By: Gretta Cool, RN, BSN, Kim on 04/08/2015 08:13:48 Rick Gilmore ([id number]) -------------------------------------------------------------------------------- Wound Assessment Details Patient Name: Rick Gilmore Date of Service: 04/08/2015 8:00 AM Medical Record Number: [id number] Patient Account Number: [id number] Date of Birth/Sex: [birth year] (74 y.o. Male) Treating RN: Cornell Barman Primary Care Physician: Deborra Medina Other Clinician: Referring Physician: Deborra Medina Treating Physician/Extender: [name] in Treatment: 3 Wound Status Wound Number: 1 Primary Trauma, Other Etiology: Wound Location: Left Lower Leg Wound Open Wounding Event: Trauma Status: Date Acquired: 03/04/2015 Comorbid Anemia,  Chronic Obstructive Pulmonary Weeks Of Treatment: 3 History: Disease (COPD), Sleep Apnea, Clustered Wound: No Arrhythmia, Congestive Heart Failure, Coronary  Artery Disease, Hypertension, Myocardial Infarction, Gout, Received Radiation Photos Photo Uploaded By: Gretta Cool, RN, BSN, Kim on 04/08/2015 13:06:22 Wound Measurements Length: (cm) 2.1 Width: (cm) 1.9 Depth: (cm) 0.6 Area: (cm) 3.134 Volume: (cm) 1.88 % Reduction in Area: -315.6% % Reduction in Volume: -1145% Epithelialization: None Tunneling: Yes Location 1 Position (o'clock): 11 Maximum Distance: (cm) 1 Location 2 Undermining: Yes Starting Position (o'clock): 6 Ending Position (o'clock): 9 Maximum Distance: (cm) 0.8 Rick Gilmore, Rick Gilmore ([id number]) Wound Description Full Thickness Without Exposed Classification: Support Structures Wound Margin: Indistinct, nonvisible Exudate Medium Amount: Exudate Type: Serosanguineous Exudate Color: red, brown Foul Odor After Cleansing: No Wound Bed Granulation Amount: None Present (0%) Exposed Structure Necrotic Amount: Large (67-100%) Fascia Exposed: No Necrotic Quality: Adherent Slough Fat Layer Exposed: No Tendon Exposed: No Muscle Exposed: No Joint Exposed: No Bone Exposed: No Limited to Skin Breakdown Periwound Skin Texture Texture Color No Abnormalities Noted: No No Abnormalities Noted: No Callus: No Erythema: Yes Crepitus: No Hemosiderin Staining: Yes Excoriation: No Temperature / Pain Fluctuance: No Temperature: No Abnormality Friable: No Induration: Yes Localized Edema: Yes Rash: No Scarring: No Moisture No Abnormalities Noted: Yes Wound Preparation Ulcer Cleansing: Rinsed/Irrigated with Saline Topical Anesthetic Applied: Other: 4% lidocaine cream, Electronic Signature(s) Signed: 04/08/2015 9:56:11 PM By: Gretta Cool, RN, BSN, Kim RN, BSN Entered By: Gretta Cool, RN, BSN, Kim on 04/08/2015 08:41:07 Rick Gilmore ([id number]) -------------------------------------------------------------------------------- [place] Details Patient Name: Rick Gilmore Date of Service: 04/08/2015 8:00 AM Medical Record Number:  [id number] Patient Account Number: [id number] Date of Birth/Sex: 1941-10-19 (73 y.o. Male) Treating RN: Cornell Barman Primary Care Physician: Deborra Medina Other Clinician: Referring Physician: Deborra Medina Treating Physician/Extender: [name] in Treatment: 3 Vital Signs Time Taken: 08:13 Temperature (F): 97.7 Height (in): 75 Pulse (bpm): 75 Weight (lbs): 252 Respiratory Rate (breaths/min): 18 Body Mass Index (BMI): 31.5 Blood Pressure (mmHg): 103/54 Reference Range: 80 - 120 mg / dl Electronic Signature(s) Signed: 04/08/2015 9:56:11 PM By: Gretta Cool, RN, BSN, Kim RN, BSN Entered By: Gretta Cool, RN, BSN, Kim on 04/08/2015 08:14:13

## 2015-04-13 ENCOUNTER — Other Ambulatory Visit: Payer: Self-pay | Admitting: Oncology

## 2015-04-13 DIAGNOSIS — D509 Iron deficiency anemia, unspecified: Secondary | ICD-10-CM

## 2015-04-15 ENCOUNTER — Encounter: Payer: Medicare Other | Admitting: Surgery

## 2015-04-15 ENCOUNTER — Other Ambulatory Visit: Payer: Self-pay | Admitting: Oncology

## 2015-04-15 DIAGNOSIS — S81812A Laceration without foreign body, left lower leg, initial encounter: Secondary | ICD-10-CM | POA: Diagnosis not present

## 2015-04-15 DIAGNOSIS — J449 Chronic obstructive pulmonary disease, unspecified: Secondary | ICD-10-CM | POA: Diagnosis not present

## 2015-04-15 DIAGNOSIS — D509 Iron deficiency anemia, unspecified: Secondary | ICD-10-CM

## 2015-04-15 DIAGNOSIS — L97222 Non-pressure chronic ulcer of left calf with fat layer exposed: Secondary | ICD-10-CM | POA: Diagnosis not present

## 2015-04-15 DIAGNOSIS — L97221 Non-pressure chronic ulcer of left calf limited to breakdown of skin: Secondary | ICD-10-CM | POA: Diagnosis not present

## 2015-04-15 DIAGNOSIS — I2510 Atherosclerotic heart disease of native coronary artery without angina pectoris: Secondary | ICD-10-CM | POA: Diagnosis not present

## 2015-04-15 DIAGNOSIS — I10 Essential (primary) hypertension: Secondary | ICD-10-CM | POA: Diagnosis not present

## 2015-04-15 DIAGNOSIS — R600 Localized edema: Secondary | ICD-10-CM | POA: Diagnosis not present

## 2015-04-15 NOTE — Progress Notes (Signed)
JOHATHON, PERAGINE ([id number]) Visit Report for 04/15/2015 Chief Complaint Document Details Patient Name: Rick Gilmore, Rick Gilmore Date of Service: 04/15/2015 9:30 AM Medical Record Number: [id number] Patient Account Number: [id number] Date of Birth/Sex: [birth year] (74 y.o. Male) Treating RN: Montey Hora Primary Care Physician: Deborra Medina Other Clinician: Referring Physician: Deborra Medina Treating Physician/Extender: [name] in Treatment: 4 Information Obtained from: Patient Chief Complaint Patient presents to the wound care center for a consult due non healing wound 74 year old gentleman comes with a history of having a injury to his left lower extremity on 03/04/15,which was seen in the ER about 10 days ago. Electronic Signature(s) Signed: 04/15/2015 2:25:04 PM By: [name] MD, FACS Entered By: [name] on 04/15/2015 10:47:14 [name] ([id number]) -------------------------------------------------------------------------------- Debridement Details Patient Name: [name] Date of Service: 04/15/2015 9:30 AM Medical Record Number: [id number] Patient Account Number: [id number] Date of Birth/Sex: 08/02/1941 (74 y.o. Male) Treating RN: Montey Hora Primary Care Physician: Deborra Medina Other Clinician: Referring Physician: Deborra Medina Treating Physician/Extender: [name] in Treatment: 4 Debridement Performed for Wound #1 Left Lower Leg Assessment: Performed By: Physician [name], MD Debridement: Open Wound/Selective Debridement Selective Description: Pre-procedure Yes Verification/Time Out Taken: Start Time: 10:40 Pain Control: Lidocaine 5% topical ointment Level: Non-Viable Tissue Total Area Debrided (L x 1.9 (cm) x 1.5 (cm) = 2.85 (cm) W): Tissue and other Non-Viable, Fibrin/Slough, Skin material debrided: Instrument: Forceps Bleeding: None End Time: 10:43 Procedural Pain: 0 Post Procedural Pain: 0 Response to Treatment:  Procedure was tolerated well Post Debridement Measurements of Total Wound Length: (cm) 1.9 Width: (cm) 1.5 Depth: (cm) 0.5 Volume: (cm) 1.119 Electronic Signature(s) Signed: 04/15/2015 2:25:04 PM By: [name] MD, FACS Signed: 04/15/2015 4:53:00 PM By: Montey Hora Entered By: [name] on 04/15/2015 10:47:03 [name] ([id number]) -------------------------------------------------------------------------------- HPI Details Patient Name: [name] Date of Service: 04/15/2015 9:30 AM Medical Record Number: [id number] Patient Account Number: [id number] Date of Birth/Sex: [birth year] (74 y.o. Male) Treating RN: Montey Hora Primary Care Physician: Deborra Medina Other Clinician: Referring Physician: Deborra Medina Treating Physician/Extender: [name] in Treatment: 4 History of Present Illness Location: left lower extremity Quality: Patient reports experiencing a dull pain to affected area(s). Severity: Patient states wound (s) are getting better. Duration: Patient has had the wound for < 2 weeks prior to presenting for treatment Timing: Pain in wound is Intermittent (comes and goes Context: The wound occurred when the patient after he had a fall and hurt himself. Modifying Factors: Patient must stand for long periods while working Associated Signs and Symptoms: Patient reports presence of swelling HPI Description: 74 year old gentleman who has a past medical history significant for gout, edema of the lower limbs, anemia, hypertension, acid reflux, depression, COPD, coronary artery disease and history of sleep apnea. He has had a bypass surgery in 1988 and has had prostate cancer with radiation and seed implants in 2002. He is also known to have a pacemaker and defibrillator placed in 2014. he has been seen in the ER on 3 different occasions since the fall and they have been applying a dry dressing and some labs were done the last time around. No x-rays or  ultrasound has been done. The patient is on an anticoagulant and this has not been held at any stage. During his ER notes from 03/10/2015 is noted that he had a wound on the left lower extremity which she's had for about 10 days now. The patient was noted to have extensive ecchymosis and tenderness and a laceration to  the left lower extremity. Note is made of the fact that he had no signs of cellulitis and his labs were normal. He was given symptomatic treatment and asked to follow-up in the wound center. 03/24/2015 -- he has been doing fine and his dressing has been done by his family members and is at no issues. His treatment the blood thinner continuous. He will be going out of town this week and will be able to see as next week on Tuesday. Electronic Signature(s) Signed: 04/15/2015 2:25:04 PM By: [name] MD, FACS Entered By: [name] on 04/15/2015 10:47:24 RANCE, FRIEDLAND ([id number]) -------------------------------------------------------------------------------- Physical Exam Details Patient Name: [name] Date of Service: 04/15/2015 9:30 AM Medical Record Number: [id number] Patient Account Number: [id number] Date of Birth/Sex: 10-18-1941 (74 y.o. Male) Treating RN: Montey Hora Primary Care Physician: Deborra Medina Other Clinician: Referring Physician: Deborra Medina Treating Physician/Extender: [name] in Treatment: 4 Constitutional . Pulse regular. Respirations normal and unlabored. Afebrile. . Eyes Nonicteric. Reactive to light. Ears, Nose, Mouth, and Throat Lips, teeth, and gums WNL.Marland Kitchen Moist mucosa without lesions . Neck supple and nontender. No palpable supraclavicular or cervical adenopathy. Normal sized without goiter. Respiratory WNL. No retractions.. Cardiovascular Pedal Pulses WNL. No clubbing, cyanosis or edema. Integumentary (Hair, Skin) the hematoma has gone down nicely and the ulcerated area is fairly clean except that it has a bit  of undermining and depth at the 7:00 position.. No crepitus or fluctuance. No peri-wound warmth or erythema. No masses.Marland Kitchen Psychiatric Judgement and insight Intact.. No evidence of depression, anxiety, or agitation.. Electronic Signature(s) Signed: 04/15/2015 2:25:04 PM By: [name] MD, FACS Entered By: [name] on 04/15/2015 10:48:23 BRANDOL, BRIXEY ([id number]) -------------------------------------------------------------------------------- Physician Orders Details Patient Name: [name] Date of Service: 04/15/2015 9:30 AM Medical Record Number: [id number] Patient Account Number: [id number] Date of Birth/Sex: January 01, 1941 (74 y.o. Male) Treating RN: [name] Primary Care Physician: Deborra Medina Other Clinician: Referring Physician: Deborra Medina Treating Physician/Extender: [name] in Treatment: 4 Verbal / Phone Orders: Yes Clinician: [name] Read Back and Verified: Yes Diagnosis Coding ICD-10 Coding Code Description [phone] Non-pressure chronic ulcer of left calf with fat layer exposed S81.812A Laceration without foreign body, left lower leg, initial encounter Wound Cleansing Wound #1 Left Lower Leg o Clean wound with Normal Saline. Anesthetic Wound #1 Left Lower Leg o Topical Lidocaine 4% cream applied to wound bed prior to debridement Primary Wound Dressing Wound #1 Left Lower Leg o Aquacel Ag - pack undermining with silver alginate rope Secondary Dressing Wound #1 Left Lower Leg o Boardered Foam Dressing Dressing Change Frequency Wound #1 Left Lower Leg o Change dressing every other day. Follow-up Appointments Wound #1 Left Lower Leg o Return Appointment in 1 week. Additional Orders / Instructions Wound #1 Left Lower Leg o Increase protein intake. o Activity as tolerated CARROL, POMPLUN ([id number]) Electronic Signature(s) Signed: 04/15/2015 2:25:04 PM By: [name] MD, FACS Signed: 04/15/2015 4:31:45 PM By:  [name] RN Entered By: [name] on 04/15/2015 10:45:57 [name] ([id number]) -------------------------------------------------------------------------------- Problem List Details Patient Name: [name] Date of Service: 04/15/2015 9:30 AM Medical Record Number: [id number] Patient Account Number: [id number] Date of Birth/Sex: Aug 13, 1941 (74 y.o. Male) Treating RN: Montey Hora Primary Care Physician: Deborra Medina Other Clinician: Referring Physician: Deborra Medina Treating Physician/Extender: [name] in Treatment: 4 Active Problems ICD-10 Encounter Code Description Active Date Diagnosis [phone] Non-pressure chronic ulcer of left calf with fat layer 03/17/2015 Yes exposed S81.812A Laceration without foreign body, left lower leg, initial  03/17/2015 Yes encounter Inactive Problems Resolved Problems Electronic Signature(s) Signed: 04/15/2015 2:25:04 PM By: [name] MD, FACS Entered By: [name] on 04/15/2015 10:45:50 [name] ([id number]) -------------------------------------------------------------------------------- Progress Note Details Patient Name: [name] Date of Service: 04/15/2015 9:30 AM Medical Record Number: [id number] Patient Account Number: [id number] Date of Birth/Sex: [birth year] (74 y.o. Male) Treating RN: Montey Hora Primary Care Physician: Deborra Medina Other Clinician: Referring Physician: Deborra Medina Treating Physician/Extender: [name] in Treatment: 4 Subjective Chief Complaint Information obtained from Patient Patient presents to the wound care center for a consult due non healing wound 74 year old gentleman comes with a history of having a injury to his left lower extremity on 03/04/15,which was seen in the ER about 10 days ago. History of Present Illness (HPI) The following HPI elements were documented for the patient's wound: Location: left lower extremity Quality: Patient reports  experiencing a dull pain to affected area(s). Severity: Patient states wound (s) are getting better. Duration: Patient has had the wound for < 2 weeks prior to presenting for treatment Timing: Pain in wound is Intermittent (comes and goes Context: The wound occurred when the patient after he had a fall and hurt himself. Modifying Factors: Patient must stand for long periods while working Associated Signs and Symptoms: Patient reports presence of swelling 74 year old gentleman who has a past medical history significant for gout, edema of the lower limbs, anemia, hypertension, acid reflux, depression, COPD, coronary artery disease and history of sleep apnea. He has had a bypass surgery in 1988 and has had prostate cancer with radiation and seed implants in 2002. He is also known to have a pacemaker and defibrillator placed in 2014. he has been seen in the ER on 3 different occasions since the fall and they have been applying a dry dressing and some labs were done the last time around. No x-rays or ultrasound has been done. The patient is on an anticoagulant and this has not been held at any stage. During his ER notes from 03/10/2015 is noted that he had a wound on the left lower extremity which she's had for about 10 days now. The patient was noted to have extensive ecchymosis and tenderness and a laceration to the left lower extremity. Note is made of the fact that he had no signs of cellulitis and his labs were normal. He was given symptomatic treatment and asked to follow-up in the wound center. 03/24/2015 -- he has been doing fine and his dressing has been done by his family members and is at no issues. His treatment the blood thinner continuous. He will be going out of town this week and will be able to see as next week on Tuesday. Coyne, [place] ([id number]) Objective Constitutional Pulse regular. Respirations normal and unlabored. Afebrile. Vitals Time Taken: 10:13 AM, Height: 75 in,  Weight: 252 lbs, BMI: 31.5, Temperature: 97.9 F, Pulse: 74 bpm, Respiratory Rate: 18 breaths/min, Blood Pressure: 98/49 mmHg. Eyes Nonicteric. Reactive to light. Ears, Nose, Mouth, and Throat Lips, teeth, and gums WNL.Marland Kitchen Moist mucosa without lesions . Neck supple and nontender. No palpable supraclavicular or cervical adenopathy. Normal sized without goiter. Respiratory WNL. No retractions.. Cardiovascular Pedal Pulses WNL. No clubbing, cyanosis or edema. Psychiatric Judgement and insight Intact.. No evidence of depression, anxiety, or agitation.. Integumentary (Hair, Skin) the hematoma has gone down nicely and the ulcerated area is fairly clean except that it has a bit of undermining and depth at the 7:00 position.. No crepitus or fluctuance. No peri-wound warmth or erythema.  No masses.. Wound #1 status is Open. Original cause of wound was Trauma. The wound is located on the Left Lower Leg. The wound measures 1.9cm length x 1.5cm width x 0.5cm depth; 2.238cm^2 area and 1.119cm^3 volume. The wound is limited to skin breakdown. There is no tunneling or undermining noted. There is a medium amount of serosanguineous drainage noted. The wound margin is indistinct and nonvisible. There is large (67-100%) red granulation within the wound bed. There is a small (1-33%) amount of necrotic tissue within the wound bed including Adherent Slough. The periwound skin appearance had no abnormalities noted for moisture. The periwound skin appearance exhibited: Induration, Localized Edema, Hemosiderin Staining, Erythema. The periwound skin appearance did not exhibit: Callus, Crepitus, Excoriation, Fluctuance, Friable, Rash, Scarring. The surrounding wound skin color is noted with erythema. Periwound temperature was noted as No Abnormality. ISHAQ, BATIS ([id number]) Assessment Active Problems ICD-10 [phone] - Non-pressure chronic ulcer of left calf with fat layer exposed S81.812A - Laceration without  foreign body, left lower leg, initial encounter Diagnoses ICD-10 L97.222: Non-pressure chronic ulcer of left calf with fat layer exposed S81.812A: Laceration without foreign body, left lower leg, initial encounter Procedures Wound #1 Wound #1 is a Trauma, Other located on the Left Lower Leg . There was a Non-Viable Tissue Open Wound/Selective [phone]) debridement with total area of 2.85 sq cm performed by Lindzy Rupert, Jackson Latino., MD. with the following instrument(s): Forceps to remove Non-Viable tissue/material including Fibrin/Slough and Skin after achieving pain control using Lidocaine 5% topical ointment. A time out was conducted prior to the start of the procedure. There was no bleeding. The procedure was tolerated well with a pain level of 0 throughout and a pain level of 0 following the procedure. Post Debridement Measurements: 1.9cm length x 1.5cm width x 0.5cm depth; 1.119cm^3 volume. Plan Wound Cleansing: Wound #1 Left Lower Leg: Clean wound with Normal Saline. Anesthetic: Wound #1 Left Lower Leg: Topical Lidocaine 4% cream applied to wound bed prior to debridement Primary Wound Dressing: Wound #1 Left Lower Leg: Aquacel Ag - pack undermining with silver alginate rope Secondary Dressing: Wound #1 Left Lower Leg: Boardered Foam Dressing Dressing Change Frequency: Wound #1 Left Lower Leg: Avakian, Nikoloz ([id number]) Change dressing every other day. Follow-up Appointments: Wound #1 Left Lower Leg: Return Appointment in 1 week. Additional Orders / Instructions: Wound #1 Left Lower Leg: Increase protein intake. Activity as tolerated Follow-Up Appointments: A Patient Clinical Summary of Care was provided to [facility] He is making good progress overall and will continue to use silver alginate into the depths of the wound. He come back and see me next week. Electronic Signature(s) Signed: 04/15/2015 1:33:50 PM By: [name] RN Signed: 04/15/2015 2:25:04 PM By: [name] MD,  FACS Entered By: [name] on 04/15/2015 13:33:49 JONATHEN, CALLUM ([id number]) -------------------------------------------------------------------------------- SuperBill Details Patient Name: [name] Date of Service: 04/15/2015 Medical Record Number: [id number] Patient Account Number: [id number] Date of Birth/Sex: 01-22-1941 (74 y.o. Male) Treating RN: Montey Hora Primary Care Physician: Deborra Medina Other Clinician: Referring Physician: Deborra Medina Treating Physician/Extender: [name] in Treatment: 4 Diagnosis Coding ICD-10 Codes Code Description [phone] Non-pressure chronic ulcer of left calf with fat layer exposed S81.812A Laceration without foreign body, left lower leg, initial encounter Facility Procedures CPT4 Code Description: [id number] - DEBRIDE WOUND 1ST 20 SQ CM OR < ICD-10 Description Diagnosis L97.222 Non-pressure chronic ulcer of left calf with fat lay S81.812A Laceration without foreign body, left lower leg, ini Modifier: er exposed tial encounter Quantity: 1 Physician  Procedures CPT4 Code Description: [id number] - WC PHYS DEBR WO ANESTH 20 SQ CM ICD-10 Description Diagnosis L97.222 Non-pressure chronic ulcer of left calf with fat laye S81.812A Laceration without foreign body, left lower leg, init Modifier: r exposed ial encounter Quantity: 1 Electronic Signature(s) Signed: 04/15/2015 2:25:04 PM By: [name] MD, FACS Entered By: [name] on 04/15/2015 10:50:12

## 2015-04-15 NOTE — Progress Notes (Signed)
TOCHUKWU, BUTRON ([id number]) Visit Report for 04/15/2015 Arrival Information Details Patient Name: Rick Gilmore, Rick Gilmore Date of Service: 04/15/2015 9:30 AM Medical Record Number: [id number] Patient Account Number: [id number] Date of Birth/Sex: [birth year] (74 y.o. Male) Treating RN: Montey Hora Primary Care Physician: Deborra Medina Other Clinician: Referring Physician: Deborra Medina Treating Physician/Extender: [name] in Treatment: 4 Visit Information History Since Last Visit Added or deleted any medications: No Patient Arrived: Ambulatory Any new allergies or adverse reactions: No Arrival Time: 10:09 Had a fall or experienced change in No Accompanied By: self activities of daily living that may affect Transfer Assistance: None risk of falls: Patient Identification Verified: Yes Signs or symptoms of abuse/neglect since last No Secondary Verification Process Yes visito Completed: Hospitalized since last visit: No Patient Requires Transmission- No Pain Present Now: No Based Precautions: Patient Has Alerts: Yes Patient Alerts: Patient on Blood Thinner Electronic Signature(s) Signed: 04/15/2015 4:53:00 PM By: Montey Hora Entered By: Montey Hora on 04/15/2015 10:13:54 Rick Gilmore ([id number]) -------------------------------------------------------------------------------- Encounter Discharge Information Details Patient Name: Rick Gilmore Date of Service: 04/15/2015 9:30 AM Medical Record Number: [id number] Patient Account Number: [id number] Date of Birth/Sex: Sep 28, 1941 (73 y.o. Male) Treating RN: Montey Hora Primary Care Physician: Deborra Medina Other Clinician: Referring Physician: Deborra Medina Treating Physician/Extender: [name] in Treatment: 4 Encounter Discharge Information Items Schedule Follow-up Appointment: No Medication Reconciliation completed No and provided to Patient/Care Hanna Aultman: Provided on Clinical Summary of  Care: 04/15/2015 Form Type Recipient Paper Patient [facility] Electronic Signature(s) Signed: 04/15/2015 10:51:00 AM By: Ruthine Dose Entered By: Ruthine Dose on 04/15/2015 10:51:00 Rick Gilmore ([id number]) -------------------------------------------------------------------------------- Lower Extremity Assessment Details Patient Name: Rick Gilmore Date of Service: 04/15/2015 9:30 AM Medical Record Number: [id number] Patient Account Number: [id number] Date of Birth/Sex: 1941/05/04 (74 y.o. Male) Treating RN: Montey Hora Primary Care Physician: Deborra Medina Other Clinician: Referring Physician: Deborra Medina Treating Physician/Extender: [name] in Treatment: 4 Edema Assessment Assessed: Shirlyn Goltz: No] [Right: No] E[Left: dema] [Right: :] Calf Left: Right: Point of Measurement: 33 cm From Medial Instep 41.2 cm cm Ankle Left: Right: Point of Measurement: 11 cm From Medial Instep 23.5 cm cm Vascular Assessment Pulses: Posterior Tibial Palpable: [Left:Yes] Dorsalis Pedis Palpable: [Left:Yes] Extremity colors, hair growth, and conditions: Extremity Color: [Left:Hyperpigmented] Hair Growth on Extremity: [Left:Yes] Temperature of Extremity: [Left:Cool] Capillary Refill: [Left:< 3 seconds] Toe Nail Assessment Left: Right: Thick: No Discolored: No Deformed: No Improper Length and Hygiene: No Electronic Signature(s) Signed: 04/15/2015 4:53:00 PM By: Montey Hora Entered By: Montey Hora on 04/15/2015 [address] ([id number]) [place], [place] ([id number]) -------------------------------------------------------------------------------- Multi Wound Chart Details Patient Name: Rick Gilmore Date of Service: 04/15/2015 9:30 AM Medical Record Number: [id number] Patient Account Number: [id number] Date of Birth/Sex: Apr 10, 1941 (74 y.o. Male) Treating RN: [name] Primary Care Physician: Deborra Medina Other Clinician: Referring Physician: Deborra Medina Treating Physician/Extender: [name] in Treatment: 4 Vital Signs Height(in): 75 Pulse(bpm): 74 Weight(lbs): 252 Blood Pressure 98/49 (mmHg): Body Mass Index(BMI): 31 Temperature(F): 97.9 Respiratory Rate 18 (breaths/min): Photos: [1:No Photos] [[name]:[name]] Wound Location: [1:Left Lower Leg] [[name]:[name]] Wounding Event: [1:Trauma] [[name]:[name]] Primary Etiology: [1:Trauma, Other] [[name]:[name]] Comorbid History: [1:Anemia, Chronic Obstructive Pulmonary Disease (COPD), Sleep Apnea, Arrhythmia, Congestive Heart Failure, Coronary Artery Disease, Hypertension, Myocardial Infarction, Gout, Received Radiation] [[name]:[name]] Date Acquired: [1:03/04/2015] [[name]:[name]] Weeks of Treatment: [1:4] [[name]:[name]] Wound Status: [1:Open] [[name]:[name]] Measurements L x W x D 1.9x1.5x0.5 [[name]:[name]] (cm) Area (cm) : [1:2.238] [[name]:[name]] Volume (cm) : [1:1.119] [[name]:[name]] % Reduction in Area: [1:-196.80%] [[name]:[name]] % Reduction in Volume: -641.10% [[name]:[name]] Classification: [  1:Full Thickness Without Exposed Support Structures] [[name]:[name]] Exudate Amount: [1:Medium] [[name]:[name]] Exudate Type: [1:Serosanguineous] [[name]:[name]] Exudate Color: [1:red, brown] [[name]:[name]] Wound Margin: [1:Indistinct, nonvisible] [[name]:[name]] Granulation Amount: [1:Large (67-100%)] [[name]:[name]] Granulation Quality: Red [name] [name] Necrotic Amount: Small (1-33%) [name] [name] Exposed Structures: Fascia: No [name] [name] Fat: No Tendon: No Muscle: No Joint: No Bone: No Limited to Skin Breakdown Epithelialization: None [name] [name] Periwound Skin Texture: Edema: Yes [name] [name] Induration: Yes Excoriation: No Callus: No Crepitus: No Fluctuance: No Friable: No Rash: No Scarring: No Periwound Skin No Abnormalities Noted [name] [name] Moisture: Periwound Skin Color: Erythema: Yes [name] [name] Hemosiderin Staining: Yes Temperature: No Abnormality [name] [name] Tenderness on No [name] [name] Palpation: Wound Preparation: Ulcer Cleansing: [name] [name] Rinsed/Irrigated  with Saline Topical Anesthetic Applied: Other: 4% lidocaine cream Treatment Notes Electronic Signature(s) Signed: 04/15/2015 4:31:45 PM By: [name] RN Entered By: [name] on 04/15/2015 10:38:34 Rick Gilmore ([id number]) -------------------------------------------------------------------------------- Multi-Disciplinary Care Plan Details Patient Name: Rick Gilmore Date of Service: 04/15/2015 9:30 AM Medical Record Number: [id number] Patient Account Number: [id number] Date of Birth/Sex: 1941-05-12 (74 y.o. Male) Treating RN: [name] Primary Care Physician: Deborra Medina Other Clinician: Referring Physician: Deborra Medina Treating Physician/Extender: [name] in Treatment: 4 Active Inactive Abuse / Safety / Falls / Self Care Management Nursing Diagnoses: Potential for falls Goals: Patient will remain injury free Date Initiated: 03/17/2015 Goal Status: Active Interventions: Assess fall risk on admission and as needed Notes: Necrotic Tissue Nursing Diagnoses: Impaired tissue integrity related to necrotic/devitalized tissue Goals: Necrotic/devitalized tissue will be minimized in the wound bed Date Initiated: 03/17/2015 Goal Status: Active Interventions: Assess patient pain level pre-, during and post procedure and prior to discharge Treatment Activities: Apply topical anesthetic as ordered : 04/15/2015 Notes: Orientation to the Wound Care Program Nursing Diagnoses: Knowledge deficit related to the wound healing center program ELIA, LARMER ([id number]) Goals: Patient/caregiver will verbalize understanding of the [facility] Program Date Initiated: 03/17/2015 Goal Status: Active Interventions: Provide education on orientation to the wound center Notes: Wound/Skin Impairment Nursing Diagnoses: Impaired tissue integrity Goals: Patient/caregiver will verbalize understanding of skin care regimen Date Initiated: 03/17/2015 Goal Status:  Active Ulcer/skin breakdown will heal within 14 weeks Date Initiated: 03/17/2015 Goal Status: Active Interventions: Assess patient/caregiver ability to obtain necessary supplies Notes: Electronic Signature(s) Signed: 04/15/2015 4:31:45 PM By: [name] RN Entered By: [name] on 04/15/2015 10:38:11 Rick Gilmore ([id number]) -------------------------------------------------------------------------------- Patient/Caregiver Education Details Patient Name: Rick Gilmore Date of Service: 04/15/2015 9:30 AM Medical Record Number: [id number] Patient Account Number: [id number] Date of Birth/Gender: 12-Nov-1941 (74 y.o. Male) Treating RN: Montey Hora Primary Care Physician: Deborra Medina Other Clinician: Referring Physician: Deborra Medina Treating Physician/Extender: [name] in Treatment: 4 Education Assessment Education Provided To: Patient Education Topics Provided Wound/Skin Impairment: Handouts: Caring for Your Ulcer Methods: Demonstration, Explain/Verbal Responses: State content correctly Electronic Signature(s) Signed: 04/15/2015 4:53:00 PM By: Montey Hora Entered By: Montey Hora on 04/15/2015 10:23:14 Rick Gilmore ([id number]) -------------------------------------------------------------------------------- Wound Assessment Details Patient Name: Rick Gilmore Date of Service: 04/15/2015 9:30 AM Medical Record Number: [id number] Patient Account Number: [id number] Date of Birth/Sex: 17-May-1941 (74 y.o. Male) Treating RN: Montey Hora Primary Care Physician: Deborra Medina Other Clinician: Referring Physician: Deborra Medina Treating Physician/Extender: [name] in Treatment: 4 Wound Status Wound Number: 1 Primary Trauma, Other Etiology: Wound Location: Left Lower Leg Wound Open Wounding Event: Trauma Status: Date Acquired: 03/04/2015 Comorbid Anemia, Chronic Obstructive Pulmonary Weeks Of Treatment: 4 History: Disease (COPD), Sleep  Apnea, Clustered Wound: No Arrhythmia, Congestive Heart  Failure, Coronary Artery Disease, Hypertension, Myocardial Infarction, Gout, Received Radiation Photos Photo Uploaded By: Montey Hora on 04/15/2015 15:50:22 Wound Measurements Length: (cm) 1.9 Width: (cm) 1.5 Depth: (cm) 0.5 Area: (cm) 2.238 Volume: (cm) 1.119 % Reduction in Area: -196.8% % Reduction in Volume: -641.1% Epithelialization: None Tunneling: No Undermining: No Wound Description Full Thickness Without Exposed Classification: Support Structures Wound Margin: Indistinct, nonvisible Exudate Medium Amount: Exudate Type: Serosanguineous Exudate Color: red, brown Buehring, Ching ([id number]) Foul Odor After Cleansing: No Wound Bed Granulation Amount: Large (67-100%) Exposed Structure Granulation Quality: Red Fascia Exposed: No Necrotic Amount: Small (1-33%) Fat Layer Exposed: No Necrotic Quality: Adherent Slough Tendon Exposed: No Muscle Exposed: No Joint Exposed: No Bone Exposed: No Limited to Skin Breakdown Periwound Skin Texture Texture Color No Abnormalities Noted: No No Abnormalities Noted: No Callus: No Erythema: Yes Crepitus: No Hemosiderin Staining: Yes Excoriation: No Temperature / Pain Fluctuance: No Temperature: No Abnormality Friable: No Induration: Yes Localized Edema: Yes Rash: No Scarring: No Moisture No Abnormalities Noted: Yes Wound Preparation Ulcer Cleansing: Rinsed/Irrigated with Saline Topical Anesthetic Applied: Other: 4% lidocaine cream, Electronic Signature(s) Signed: 04/15/2015 4:53:00 PM By: Montey Hora Entered By: Montey Hora on 04/15/2015 10:22:43 CAELUM, NYGARD ([id number]) -------------------------------------------------------------------------------- Vitals Details Patient Name: Rick Gilmore Date of Service: 04/15/2015 9:30 AM Medical Record Number: [id number] Patient Account Number: [id number] Date of Birth/Sex: Oct 26, 1941 (73 y.o.  Male) Treating RN: Montey Hora Primary Care Physician: Deborra Medina Other Clinician: Referring Physician: Deborra Medina Treating Physician/Extender: [name] in Treatment: 4 Vital Signs Time Taken: 10:13 Temperature (F): 97.9 Height (in): 75 Pulse (bpm): 74 Weight (lbs): 252 Respiratory Rate (breaths/min): 18 Body Mass Index (BMI): 31.5 Blood Pressure (mmHg): 98/49 Reference Range: 80 - 120 mg / dl Electronic Signature(s) Signed: 04/15/2015 4:53:00 PM By: Montey Hora Entered By: Montey Hora on 04/15/2015 10:14:18

## 2015-04-16 ENCOUNTER — Inpatient Hospital Stay: Payer: Medicare Other

## 2015-04-16 ENCOUNTER — Inpatient Hospital Stay: Payer: Medicare Other | Attending: Oncology

## 2015-04-16 ENCOUNTER — Inpatient Hospital Stay (HOSPITAL_BASED_OUTPATIENT_CLINIC_OR_DEPARTMENT_OTHER): Payer: Medicare Other | Admitting: Oncology

## 2015-04-16 VITALS — BP 90/50 | HR 84 | Resp 18 | Wt 250.7 lb

## 2015-04-16 DIAGNOSIS — D696 Thrombocytopenia, unspecified: Secondary | ICD-10-CM | POA: Insufficient documentation

## 2015-04-16 DIAGNOSIS — E785 Hyperlipidemia, unspecified: Secondary | ICD-10-CM

## 2015-04-16 DIAGNOSIS — J449 Chronic obstructive pulmonary disease, unspecified: Secondary | ICD-10-CM

## 2015-04-16 DIAGNOSIS — G4733 Obstructive sleep apnea (adult) (pediatric): Secondary | ICD-10-CM

## 2015-04-16 DIAGNOSIS — D509 Iron deficiency anemia, unspecified: Secondary | ICD-10-CM | POA: Insufficient documentation

## 2015-04-16 DIAGNOSIS — Z79899 Other long term (current) drug therapy: Secondary | ICD-10-CM

## 2015-04-16 DIAGNOSIS — I255 Ischemic cardiomyopathy: Secondary | ICD-10-CM | POA: Insufficient documentation

## 2015-04-16 DIAGNOSIS — Z8546 Personal history of malignant neoplasm of prostate: Secondary | ICD-10-CM | POA: Insufficient documentation

## 2015-04-16 DIAGNOSIS — I10 Essential (primary) hypertension: Secondary | ICD-10-CM | POA: Insufficient documentation

## 2015-04-16 DIAGNOSIS — I2510 Atherosclerotic heart disease of native coronary artery without angina pectoris: Secondary | ICD-10-CM | POA: Diagnosis not present

## 2015-04-16 LAB — IRON AND TIBC
Iron: 72 ug/dL (ref 45–182)
Saturation Ratios: 20 % (ref 17.9–39.5)
TIBC: 368 ug/dL (ref 250–450)
UIBC: 296 ug/dL

## 2015-04-16 LAB — CBC WITH DIFFERENTIAL/PLATELET
Basophils Absolute: 0 10*3/uL (ref 0–0.1)
Basophils Relative: 0 %
Eosinophils Absolute: 0.1 10*3/uL (ref 0–0.7)
Eosinophils Relative: 2 %
HCT: 36.5 % — ABNORMAL LOW (ref 40.0–52.0)
Hemoglobin: 11.9 g/dL — ABNORMAL LOW (ref 13.0–18.0)
Lymphocytes Relative: 24 %
Lymphs Abs: 1.7 10*3/uL (ref 1.0–3.6)
MCH: 32.7 pg (ref 26.0–34.0)
MCHC: 32.7 g/dL (ref 32.0–36.0)
MCV: 100 fL (ref 80.0–100.0)
Monocytes Absolute: 0.7 10*3/uL (ref 0.2–1.0)
Monocytes Relative: 10 %
Neutro Abs: 4.6 10*3/uL (ref 1.4–6.5)
Neutrophils Relative %: 64 %
Platelets: 124 10*3/uL — ABNORMAL LOW (ref 150–440)
RBC: 3.65 MIL/uL — ABNORMAL LOW (ref 4.40–5.90)
RDW: 15 % — ABNORMAL HIGH (ref 11.5–14.5)
WBC: 7.2 10*3/uL (ref 3.8–10.6)

## 2015-04-16 LAB — FERRITIN: Ferritin: 285 ng/mL (ref 24–336)

## 2015-04-16 NOTE — Telephone Encounter (Signed)
Message sent

## 2015-04-18 ENCOUNTER — Encounter: Payer: Self-pay | Admitting: Internal Medicine

## 2015-04-18 ENCOUNTER — Ambulatory Visit (INDEPENDENT_AMBULATORY_CARE_PROVIDER_SITE_OTHER): Payer: Medicare Other | Admitting: Internal Medicine

## 2015-04-18 DIAGNOSIS — I255 Ischemic cardiomyopathy: Secondary | ICD-10-CM

## 2015-04-18 DIAGNOSIS — J439 Emphysema, unspecified: Secondary | ICD-10-CM | POA: Diagnosis not present

## 2015-04-18 DIAGNOSIS — I5022 Chronic systolic (congestive) heart failure: Secondary | ICD-10-CM

## 2015-04-18 DIAGNOSIS — J90 Pleural effusion, not elsewhere classified: Secondary | ICD-10-CM

## 2015-04-18 DIAGNOSIS — D500 Iron deficiency anemia secondary to blood loss (chronic): Secondary | ICD-10-CM | POA: Diagnosis not present

## 2015-04-18 DIAGNOSIS — Z9989 Dependence on other enabling machines and devices: Secondary | ICD-10-CM

## 2015-04-18 DIAGNOSIS — G4733 Obstructive sleep apnea (adult) (pediatric): Secondary | ICD-10-CM

## 2015-04-18 NOTE — Progress Notes (Signed)
Patient ID: Rick Gilmore, male   DOB: [birth year], 74 y.o.   MRN: [id number]  Patient Active Problem List   Diagnosis Date Noted  . Iron deficiency anemia 04/13/2015  . Hypomagnesemia 12/01/2014  . Hypokalemia 12/01/2014  . Thrombocytopenia 12/01/2014  . Hyperlipidemia LDL goal <70 12/01/2014  . Pleural effusion, bilateral 10/17/2014  . Chronic systolic heart failure [id number]  . Sprain of right knee/leg 06/12/2014  . Iron deficiency anemia due to chronic blood loss 06/12/2014  . Long term current use of anticoagulant therapy 11/02/2011  . Pernicious anemia 11/02/2011  . Hx of adenomatous colonic polyps 11/02/2011  . Personal history of prostate cancer   . Ischemic cardiomyopathy   . Sleep apnea, obstructive     Subjective:  CC:   Chief Complaint  Patient presents with  . Follow-up    3 month followup. BPs have been running low    HPI:   Juanita Zeisloft is a 74 y.o. male who presents for follow up on hypotension , ischemic cardiomyopathy .He has a GI appt  For FOBT positive and chronic loose stools  For the past month , which have been occurring in the AM .  GI appt was postponed due to leg injury from another fall.   Stopped the iron two days ago.  His last colonoscopy was 5 years ago,  2 polyps found.  He is requesting  A referral to [facility] pulmonology ,  to [place].  He is frustrated and dissatisfied with Dr. Raul Del because Dr. Raul Del is not prompt with the return of results from recent testing.    Past Medical History  Diagnosis Date  . CAD (coronary artery disease)   . Atrial fibrillation     on Coumadin  . Hypertension   . Erectile dysfunction   . Hyperlipidemia   . Ischemic cardiomyopathy     s/p AICD/pacer 2009, replaced 2010 [facility]  . Cancer 2001    Prostate, XRT and implant  . Tubular adenoma     colon polyp  . Personal history of prostate cancer 2001    s/p XRT and implant ([facility])  . Sleep apnea, obstructive     uses CPAP nightly  . Anemia   .  Thrombocytopenia   . Prostate cancer   . CHF (congestive heart failure)   . MI (myocardial infarction) 1988  . Arrhythmia     a fib  . COPD (chronic obstructive pulmonary disease)   . Vitamin B 12 deficiency     Past Surgical History  Procedure Laterality Date  . Vasectomy    . Coronary artery bypass graft  1988  . Implantable cardioverter defibrillator generator change  April 2014    Bi V RRR  . Insert / replace / remove pacemaker  2014       The following portions of the patient's history were reviewed and updated as appropriate: Allergies, current medications, and problem list.    Review of Systems:   Patient denies headache, fevers, malaise, unintentional weight loss, skin rash, eye pain, sinus congestion and sinus pain, sore throat, dysphagia,  hemoptysis , cough, dyspnea, wheezing, chest pain, palpitations, orthopnea, edema, abdominal pain, nausea, melena, diarrhea, constipation, flank pain, dysuria, hematuria, urinary  Frequency, nocturia, numbness, tingling, seizures,  Focal weakness, Loss of consciousness,  Tremor, insomnia, depression, anxiety, and suicidal ideation.     History   Social History  . Marital Status: Single    Spouse Name: [name]  . Number of Children: [name]  . Years of Education: [name]  Occupational History  . Not on file.   Social History Main Topics  . Smoking status: Never Smoker   . Smokeless tobacco: Never Used  . Alcohol Use: 1.0 oz/week    2 Standard drinks or equivalent per week  . Drug Use: No  . Sexual Activity: Not on file   Other Topics Concern  . Not on file   Social History Narrative    Objective:  Filed Vitals:   04/18/15 1335  BP: 118/58  Pulse: 77  Resp: 14     General appearance: alert, cooperative and appears stated age Ears: normal TM's and external ear canals both ears Throat: lips, mucosa, and tongue normal; teeth and gums normal Neck: no adenopathy, no carotid bruit, supple, symmetrical, trachea midline and  thyroid not enlarged, symmetric, no tenderness/mass/nodules Back: symmetric, no curvature. ROM normal. No CVA tenderness. Lungs: clear to auscultation bilaterally Heart: regular rate and rhythm, S1, S2 normal, no murmur, click, rub or gallop Abdomen: soft, non-tender; bowel sounds normal; no masses,  no organomegaly Pulses: 2+ and symmetric Skin: Skin color, texture, turgor normal. No rashes or lesions Lymph nodes: Cervical, supraclavicular, and axillary nodes normal.  Assessment and Plan:  Iron deficiency anemia due to chronic blood loss For GI evaluation due to heme positive stools and anemia .    Chronic systolic heart failure With recent admission for pulmonary edema leading to respiratory distress.  Patient appears stable currently,  And his  BP has improved    Pleural effusion, bilateral Resolved clinically.     Hypomagnesemia Advised to continue magnesium until his level is verified.     A total of 25 minutes of face to face time was spent with patient more than half of which was spent in counselling about the above mentioned conditions  and coordination of care  Updated Medication List Outpatient Encounter Prescriptions as of 04/18/2015  Medication Sig  . albuterol (PROVENTIL HFA;VENTOLIN HFA) 108 (90 BASE) MCG/ACT inhaler Inhale 2 puffs into the lungs every 6 (six) hours as needed for wheezing or shortness of breath.  . allopurinol (ZYLOPRIM) 300 MG tablet Take 1 tablet (300 mg total) by mouth daily.  . colchicine 0.6 MG tablet Take 0.6 mg by mouth daily.  . cyanocobalamin (,VITAMIN B-12,) 1000 MCG/ML injection Inject 1,000 mcg into the muscle every 30 (thirty) days.  Marland Kitchen doxazosin (CARDURA) 4 MG tablet TAKE 1 TABLET AT BEDTIME  . ELIQUIS 5 MG TABS tablet TAKE 1 TABLET TWICE A DAY  . escitalopram (LEXAPRO) 10 MG tablet TAKE 1 TABLET DAILY  . furosemide (LASIX) 40 MG tablet Take 40 mg by mouth daily. Takes 40 mg or 80 mg  . furosemide (LASIX) 80 MG tablet TAKE 1 TABLET  DAILY  . HYDROcodone-acetaminophen (NORCO/VICODIN) 5-325 MG per tablet Take 1-2 tablets by mouth every 4 (four) hours as needed for moderate pain.  . isosorbide mononitrate (IMDUR) 30 MG 24 hr tablet Take 30 mg by mouth daily.  Marland Kitchen lisinopril (PRINIVIL,ZESTRIL) 5 MG tablet Take 2.5 mg by mouth 2 (two) times daily.  . magnesium oxide (MAG-OX) 400 MG tablet Take 1 tablet (400 mg total) by mouth daily.  . metoprolol succinate (TOPROL-XL) 100 MG 24 hr tablet TAKE 1 TABLET DAILY  . NEXIUM 40 MG capsule TAKE 1 CAPSULE DAILY BEFORE BREAKFAST  . potassium chloride SA (K-DUR,KLOR-CON) 20 MEQ tablet Take 1 tablet (20 mEq total) by mouth daily.  . simvastatin (ZOCOR) 10 MG tablet TAKE 1 TABLET AT BEDTIME  . spironolactone (ALDACTONE) 25 MG tablet  Take 1 tablet (25 mg total) by mouth daily.  . [DISCONTINUED] ANORO ELLIPTA 62.5-25 MCG/INH AEPB Take 1 puff by mouth daily.  . [DISCONTINUED] dextromethorphan (DELSYM) 30 MG/5ML liquid Take 30 mg by mouth as needed for cough (10 mls orally every 12 hours as needed for cough).  . [DISCONTINUED] iron polysaccharides (FERREX 150) 150 MG capsule Take 1 capsule (150 mg total) by mouth daily.  . [DISCONTINUED] mupirocin ointment (BACTROBAN) 2 % Place 1 application into the nose 3 (three) times daily.  . [DISCONTINUED] OXYGEN Inhale 2 L into the lungs at bedtime.   No facility-administered encounter medications on file as of 04/18/2015.     Orders Placed This Encounter  Procedures  . Basic metabolic panel  . Magnesium  . Ambulatory referral to Pulmonology    Return in about 6 months (around 10/19/2015).

## 2015-04-18 NOTE — Progress Notes (Signed)
Pre visit review using our clinic review tool, if applicable. No additional management support is needed unless otherwise documented below in the visit note. 

## 2015-04-18 NOTE — Patient Instructions (Addendum)
If  You taking  the cardura,  And tolerating it,  Stay on it,  If you are not taking it,  Do not resume bc it will lower you bp even more.   Continue the once daily mg oxide for now pending review of mg level from today  Referral to [facility] pulmonology in process

## 2015-04-20 DIAGNOSIS — J449 Chronic obstructive pulmonary disease, unspecified: Secondary | ICD-10-CM | POA: Insufficient documentation

## 2015-04-20 NOTE — Assessment & Plan Note (Signed)
For GI evaluation due to heme positive stools and anemia .

## 2015-04-20 NOTE — Assessment & Plan Note (Signed)
Resolved clinically 

## 2015-04-20 NOTE — Assessment & Plan Note (Signed)
Advised to continue magnesium until his level is verified.

## 2015-04-20 NOTE — Assessment & Plan Note (Signed)
With recent admission for pulmonary edema leading to respiratory distress.  Patient appears stable currently,  And his  BP has improved

## 2015-04-20 NOTE — Assessment & Plan Note (Addendum)
Mild,  Complicated by OSA.  Referral to [facility] for ongoing follow up per pateint request /dissatisfation with current pulmonologist.

## 2015-04-21 ENCOUNTER — Encounter: Payer: Medicare Other | Admitting: Surgery

## 2015-04-21 DIAGNOSIS — J449 Chronic obstructive pulmonary disease, unspecified: Secondary | ICD-10-CM | POA: Diagnosis not present

## 2015-04-21 DIAGNOSIS — I2510 Atherosclerotic heart disease of native coronary artery without angina pectoris: Secondary | ICD-10-CM | POA: Diagnosis not present

## 2015-04-21 DIAGNOSIS — L97221 Non-pressure chronic ulcer of left calf limited to breakdown of skin: Secondary | ICD-10-CM | POA: Diagnosis not present

## 2015-04-21 DIAGNOSIS — I10 Essential (primary) hypertension: Secondary | ICD-10-CM | POA: Diagnosis not present

## 2015-04-21 DIAGNOSIS — S81812A Laceration without foreign body, left lower leg, initial encounter: Secondary | ICD-10-CM | POA: Diagnosis not present

## 2015-04-21 DIAGNOSIS — L97222 Non-pressure chronic ulcer of left calf with fat layer exposed: Secondary | ICD-10-CM | POA: Diagnosis not present

## 2015-04-21 DIAGNOSIS — R600 Localized edema: Secondary | ICD-10-CM | POA: Diagnosis not present

## 2015-04-21 NOTE — Progress Notes (Signed)
[name], [name] ([id number]) Visit Report for 04/21/2015 Chief Complaint Document Details Patient Name: Rick Gilmore, Rick Gilmore Date of Service: 04/21/2015 11:30 AM Medical Record Number: [id number] Patient Account Number: [id number] Date of Birth/Sex: 06-06-1941 (74 y.o. Male) Treating RN: [name] Primary Rick Gilmore Physician: Deborra Medina Other Clinician: Referring Physician: Deborra Medina Treating Physician/Extender: [name] in Treatment: 5 Information Obtained from: Patient Chief Complaint Patient presents to the wound [facility]more center for a consult due non healing wound 74 year old gentleman comes with a history of having a injury to his left lower extremity on 03/04/15,which was seen in the ER about 10 days ago. Electronic Signature(s) Signed: 04/21/2015 12:30:09 PM By: [name] MD, FACS Entered By: [name] on 04/21/2015 11:56:43 Rick Gilmore ([id number]) -------------------------------------------------------------------------------- Debridement Details Patient Name: Rick Gilmore Date of Service: 04/21/2015 11:30 AM Medical Record Number: [id number] Patient Account Number: [id number] Date of Birth/Sex: 1941/04/30 (74 y.o. Male) Treating RN: [name] Primary Rick Gilmore Physician: Deborra Medina Other Clinician: Referring Physician: Deborra Medina Treating Physician/Extender: [name] in Treatment: 5 Debridement Performed for Wound #1 Left Lower Leg Assessment: Performed By: Physician [name], MD Debridement: Open Wound/Selective Debridement Selective Description: Pre-procedure Yes Verification/Time Out Taken: Start Time: 11:50 Pain Control: Lidocaine 4% Topical Solution Level: Non-Viable Tissue Total Area Debrided (L x 1.2 (cm) x 1.5 (cm) = 1.8 (cm) W): Tissue and other Non-Viable, Exudate, Fibrin/Slough material debrided: Bleeding: Minimum Hemostasis Achieved: Pressure End Time: 11:52 Procedural Pain: 0 Post Procedural Pain:  0 Response to Treatment: Procedure was tolerated well Post Debridement Measurements of Total Wound Length: (cm) 1.2 Width: (cm) 1.5 Depth: (cm) 0.8 Volume: (cm) 1.131 Electronic Signature(s) Signed: 04/21/2015 12:30:09 PM By: [name] MD, FACS Signed: 04/21/2015 4:30:08 PM By: [name] RN Entered By: [name] on 04/21/2015 11:56:35 Rick Gilmore, Rick Gilmore ([id number]) -------------------------------------------------------------------------------- HPI Details Patient Name: Rick Gilmore Date of Service: 04/21/2015 11:30 AM Medical Record Number: [id number] Patient Account Number: [id number] Date of Birth/Sex: 03/19/1941 (74 y.o. Male) Treating RN: [name] Primary Rick Gilmore Physician: Deborra Medina Other Clinician: Referring Physician: Deborra Medina Treating Physician/Extender: [name] in Treatment: 5 History of Present Illness Location: left lower extremity Quality: Patient reports experiencing a dull pain to affected area(s). Severity: Patient states wound (s) are getting better. Duration: Patient has had the wound for < 2 weeks prior to presenting for treatment Timing: Pain in wound is Intermittent (comes and goes Context: The wound occurred when the patient after he had a fall and hurt himself. Modifying Factors: Patient must stand for long periods while working Associated Signs and Symptoms: Patient reports presence of swelling HPI Description: 74 year old gentleman who has a past medical history significant for gout, edema of the lower limbs, anemia, hypertension, acid reflux, depression, COPD, coronary artery disease and history of sleep apnea. He has had a bypass surgery in 1988 and has had prostate cancer with radiation and seed implants in 2002. He is also known to have a pacemaker and defibrillator placed in 2014. he has been seen in the ER on 3 different occasions since the fall and they have been applying a dry dressing and some labs were done the  last time around. No x-rays or ultrasound has been done. The patient is on an anticoagulant and this has not been held at any stage. During his ER notes from 03/10/2015 is noted that he had a wound on the left lower extremity which she's had for about 10 days now. The patient was noted to have extensive ecchymosis and tenderness and a  laceration to the left lower extremity. Note is made of the fact that he had no signs of cellulitis and his labs were normal. He was given symptomatic treatment and asked to follow-up in the wound center. 03/24/2015 -- he has been doing fine and his dressing has been done by his family members and is at no issues. His treatment the blood thinner continuous. He will be going out of town this week and will be able to see as next week on Tuesday. Electronic Signature(s) Signed: 04/21/2015 12:30:09 PM By: [name] MD, FACS Entered By: [name] on 04/21/2015 11:56:49 Rick Gilmore, Rick Gilmore ([id number]) -------------------------------------------------------------------------------- Physical Exam Details Patient Name: Rick Gilmore Date of Service: 04/21/2015 11:30 AM Medical Record Number: [id number] Patient Account Number: [id number] Date of Birth/Sex: 06/07/1941 (74 y.o. Male) Treating RN: [name] Primary Rick Gilmore Physician: Deborra Medina Other Clinician: Referring Physician: Deborra Medina Treating Physician/Extender: [name] in Treatment: 5 Constitutional . Pulse regular. Respirations normal and unlabored. Afebrile. . Eyes Nonicteric. Reactive to light. Ears, Nose, Mouth, and Throat Lips, teeth, and gums WNL.Marland Kitchen Moist mucosa without lesions . Neck supple and nontender. No palpable supraclavicular or cervical adenopathy. Normal sized without goiter. Respiratory WNL. No retractions.. Cardiovascular Pedal Pulses WNL. No clubbing, cyanosis or edema. Integumentary (Hair, Skin) there is still some undermining between the 6 to 9:00 position but  other than that the wound is healing nicely.Marland Kitchen No crepitus or fluctuance. No peri-wound warmth or erythema. No masses.Marland Kitchen Psychiatric Judgement and insight Intact.. No evidence of depression, anxiety, or agitation.. Electronic Signature(s) Signed: 04/21/2015 12:30:09 PM By: [name] MD, FACS Entered By: [name] on 04/21/2015 11:57:33 Rick Gilmore ([id number]) -------------------------------------------------------------------------------- Physician Orders Details Patient Name: Rick Gilmore Date of Service: 04/21/2015 11:30 AM Medical Record Number: [id number] Patient Account Number: [id number] Date of Birth/Sex: 09/11/1941 (74 y.o. Male) Treating RN: [name] Primary Rick Gilmore Physician: Deborra Medina Other Clinician: Referring Physician: Deborra Medina Treating Physician/Extender: [name] in Treatment: 5 Verbal / Phone Orders: Yes Clinician: [name] Read Back and Verified: Yes Diagnosis Coding Wound Cleansing Wound #1 Left Lower Leg o Clean wound with Normal Saline. Anesthetic Wound #1 Left Lower Leg o Topical Lidocaine 4% cream applied to wound bed prior to debridement Primary Wound Dressing Wound #1 Left Lower Leg o Iodosorb Ointment - on packing strip into undermined area o Plain packing gauze Secondary Dressing Wound #1 Left Lower Leg o Boardered Foam Dressing Dressing Change Frequency Wound #1 Left Lower Leg o Change dressing every other day. Follow-up Appointments Wound #1 Left Lower Leg o Return Appointment in 1 week. Additional Orders / Instructions Wound #1 Left Lower Leg o Increase protein intake. o Activity as tolerated Electronic Signature(s) Signed: 04/21/2015 12:30:09 PM By: [name] MD, FACS Signed: 04/21/2015 4:30:08 PM By: [name] RN Rick Gilmore, Rick Gilmore ([id number]) Entered By: [name] on 04/21/2015 11:55:16 Rick Gilmore, Rick Gilmore  ([id number]) -------------------------------------------------------------------------------- Problem List Details Patient Name: Rick Gilmore Date of Service: 04/21/2015 11:30 AM Medical Record Number: [id number] Patient Account Number: [id number] Date of Birth/Sex: Mar 16, 1941 (74 y.o. 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 Male) Treating RN: [name] Primary Rick Gilmore Physician: Deborra Medina Other Clinician: Referring Physician: Deborra Medina Treating Physician/Extender: [name] in Treatment: 5 Subjective Chief Complaint Information obtained from Patient Patient presents to the wound Rick Gilmore center for a consult due non healing wound 74 year old gentleman comes with a history of having a injury to his left lower extremity on 03/04/15,which was seen in the ER about 10 days ago. History of Present Illness (HPI) The following HPI elements were documented for the patient's wound: Location: left lower extremity Quality: Patient reports experiencing a dull pain to affected area(s). Severity: Patient states wound (s)  are getting better. Duration: Patient has had the wound for < 2 weeks prior to presenting for treatment Timing: Pain in wound is Intermittent (comes and goes Context: The wound occurred when the patient after he had a fall and hurt himself. Modifying Factors: Patient must stand for long periods while working Associated Signs and Symptoms: Patient reports presence of swelling 74 year old gentleman who has a past medical history significant for gout, edema of the lower limbs, anemia, hypertension, acid reflux, depression, COPD, coronary artery disease and history of sleep apnea. He has had a bypass surgery in 1988 and has had prostate cancer with radiation and seed implants in 2002. He is also known to have a pacemaker and defibrillator placed in 2014. he has been seen in the ER on 3 different occasions since the fall and they have been applying a dry dressing and some labs were done the last time around. No x-rays or ultrasound has been done. The patient is on an anticoagulant and this has not been held at any stage. During his ER notes from 03/10/2015 is noted that he had a wound on the left lower extremity which she's had for about 10 days now. The patient was noted to have extensive ecchymosis and tenderness and a laceration to the left lower extremity. Note is made of the fact that he had no signs of cellulitis and his labs were normal. He was given symptomatic treatment and asked to follow-up in the wound center. 03/24/2015 -- he has been doing fine and his dressing has been done by his family members and is at no issues. His treatment the blood thinner continuous. He will be going out of town this week and will be able to see as next week on Tuesday. Rick Gilmore, Rick Gilmore ([id number]) Objective Constitutional Pulse regular. Respirations normal and unlabored. Afebrile. Vitals Time Taken: 11:35 AM, Height: 75 in, Weight: 252 lbs, BMI: 31.5, Temperature: 97.8 F, Pulse: 81 bpm, Respiratory Rate:  16 breaths/min, Blood Pressure: 91/62 mmHg. Eyes Nonicteric. Reactive to light. Ears, Nose, Mouth, and Throat Lips, teeth, and gums WNL.Marland Kitchen Moist mucosa without lesions . Neck supple and nontender. No palpable supraclavicular or cervical adenopathy. Normal sized without goiter. Respiratory WNL. No retractions.. Cardiovascular Pedal Pulses WNL. No clubbing, cyanosis or edema. Psychiatric Judgement and insight Intact.. No evidence of depression, anxiety, or agitation.. Integumentary (Hair, Skin) there is still some undermining between the 6 to 9:00 position but other than that the wound is healing nicely.Marland Kitchen No crepitus or fluctuance. No peri-wound warmth or erythema. No masses.. Wound #1 status is Open. Original cause of wound was Trauma. The wound is located on the Left Lower Leg. The wound measures 1.2cm length x 1.5cm width x 0.8cm depth;  1.414cm^2 area and 1.131cm^3 volume. The wound is limited to skin breakdown. There is undermining starting at 6:00 and ending at 9:00 with a maximum distance of 0.8cm. There is a medium amount of serosanguineous drainage noted. The wound margin is indistinct and nonvisible. There is large (67-100%) red granulation within the wound bed. There is a small (1-33%) amount of necrotic tissue within the wound bed including Eschar. The periwound skin appearance had no abnormalities noted for moisture. The periwound skin appearance exhibited: Induration, Localized Edema, Hemosiderin Staining, Erythema. The periwound skin appearance did not exhibit: Callus, Crepitus, Excoriation, Fluctuance, Friable, Rash, Scarring. The surrounding wound skin color is noted with erythema. Periwound temperature was noted as No Abnormality. Assessment Rick Gilmore, Rick Gilmore ([id number]) Active Problems ICD-10 [phone] - Non-pressure chronic ulcer of left calf with fat layer exposed S81.812A - Laceration without foreign body, left lower leg, initial encounter I'm going to change over the  dressing material to Iodosorb with a packing strip. He will come back and see as next week. Procedures Wound #1 Wound #1 is a Trauma, Other located on the Left Lower Leg . There was a Non-Viable Tissue Open Wound/Selective [phone]) debridement with total area of 1.8 sq cm performed by Jasai Sorg, Jackson Latino., MD. to remove Non-Viable tissue/material including Exudate and Fibrin/Slough after achieving pain control using Lidocaine 4% Topical Solution. A time out was conducted prior to the start of the procedure. A Minimum amount of bleeding was controlled with Pressure. The procedure was tolerated well with a pain level of 0 throughout and a pain level of 0 following the procedure. Post Debridement Measurements: 1.2cm length x 1.5cm width x 0.8cm depth; 1.131cm^3 volume. Plan Wound Cleansing: Wound #1 Left Lower Leg: Clean wound with Normal Saline. Anesthetic: Wound #1 Left Lower Leg: Topical Lidocaine 4% cream applied to wound bed prior to debridement Primary Wound Dressing: Wound #1 Left Lower Leg: Iodosorb Ointment - on packing strip into undermined area Plain packing gauze Secondary Dressing: Wound #1 Left Lower Leg: Boardered Foam Dressing Dressing Change Frequency: Wound #1 Left Lower Leg: Change dressing every other day. Follow-up Appointments: Rick Gilmore, Rick Gilmore ([id number]) Wound #1 Left Lower Leg: Return Appointment in 1 week. Additional Orders / Instructions: Wound #1 Left Lower Leg: Increase protein intake. Activity as tolerated I'm going to change over the dressing material to Iodosorb with a packing strip. He will come back and see as next week. Electronic Signature(s) Signed: 04/21/2015 12:30:09 PM By: [name] MD, FACS Entered By: [name] on 04/21/2015 11:58:24 Rick Gilmore ([id number]) -------------------------------------------------------------------------------- [facility] Details Patient Name: Rick Gilmore Date of Service: 04/21/2015 Medical Record  Number: [id number] Patient Account Number: [id number] Date of Birth/Sex: January 12, 1941 (74 y.o. Male) Treating RN: [name] Primary Rick Gilmore Physician: Deborra Medina Other Clinician: Referring Physician: Deborra Medina Treating Physician/Extender: [name] in Treatment: 5 Diagnosis Coding ICD-10 Codes Code Description [phone] Non-pressure chronic ulcer of left calf with fat layer exposed S81.812A Laceration without foreign body, left lower leg, initial encounter Facility Procedures CPT4 Code Description: [id number] - DEBRIDE WOUND 1ST 20 SQ CM OR < ICD-10 Description Diagnosis L97.222 Non-pressure chronic ulcer of left calf with fat lay S81.812A Laceration without foreign body, left lower leg, ini Modifier: er exposed tial encounter Quantity: 1 Physician Procedures CPT4 Code Description: [id number] - WC PHYS DEBR WO ANESTH 20 SQ CM ICD-10 Description Diagnosis L97.222 Non-pressure chronic ulcer of left calf with fat laye S81.812A Laceration without foreign body, left lower leg, init Modifier: r exposed ial encounter Quantity: 1 Electronic Signature(s)  Signed: 04/21/2015 12:30:09 PM By: [name] MD, FACS Entered By: [name] on 04/21/2015 11:58:35

## 2015-04-21 NOTE — Progress Notes (Signed)
Rick, Gilmore ([id number]) Visit Report for 04/21/2015 Arrival Information Details Patient Name: Rick Gilmore, Rick Gilmore Date of Service: 04/21/2015 11:30 AM Medical Record Number: [id number] Patient Account Number: [id number] Date of Birth/Sex: 09-07-1941 (74 y.o. Male) Treating RN: [name] Primary Care Physician: Deborra Medina Other Clinician: Referring Physician: Deborra Medina Treating Physician/Extender: [name] in Treatment: 5 Visit Information History Since Last Visit Added or deleted any medications: No Patient Arrived: Ambulatory Any new allergies or adverse reactions: No Arrival Time: 11:34 Had a fall or experienced change in No Accompanied By: self activities of daily living that may affect Transfer Assistance: None risk of falls: Patient Identification Verified: Yes Signs or symptoms of abuse/neglect since last No Secondary Verification Process Yes visito Completed: Hospitalized since last visit: No Patient Requires Transmission- No Has Dressing in Place as Prescribed: Yes Based Precautions: Pain Present Now: No Patient Has Alerts: Yes Patient Alerts: Patient on Blood Thinner Electronic Signature(s) Signed: 04/21/2015 4:30:08 PM By: [name] RN Entered By: [name] on 04/21/2015 11:36:21 Rick Gilmore ([id number]) -------------------------------------------------------------------------------- Encounter Discharge Information Details Patient Name: Rick Gilmore Date of Service: 04/21/2015 11:30 AM Medical Record Number: [id number] Patient Account Number: [id number] Date of Birth/Sex: 1941-01-23 (73 y.o. Male) Treating RN: [name] Primary Care Physician: Deborra Medina Other Clinician: Referring Physician: Deborra Medina Treating Physician/Extender: [name] in Treatment: 5 Encounter Discharge Information Items Schedule Follow-up Appointment: No Medication Reconciliation completed No and provided to Patient/Care  April Colter: Provided on Clinical Summary of Care: 04/21/2015 Form Type Recipient Paper Patient [facility] Electronic Signature(s) Signed: 04/21/2015 12:06:26 PM By: Ruthine Dose Entered By: Ruthine Dose on 04/21/2015 12:06:26 Rick Gilmore ([id number]) -------------------------------------------------------------------------------- Lower Extremity Assessment Details Patient Name: Rick Gilmore Date of Service: 04/21/2015 11:30 AM Medical Record Number: [id number] Patient Account Number: [id number] Date of Birth/Sex: 1941/03/21 (73 y.o. Male) Treating RN: [name] Primary Care Physician: Deborra Medina Other Clinician: Referring Physician: Deborra Medina Treating Physician/Extender: [name] in Treatment: 5 Edema Assessment Assessed: Shirlyn Goltz: Yes] [Right: No] Edema: [Left: Ye] [Right: s] Calf Left: Right: Point of Measurement: 33 cm From Medial Instep 41 cm cm Ankle Left: Right: Point of Measurement: 11 cm From Medial Instep 25.2 cm cm Vascular Assessment Claudication: Claudication Assessment [Left:None] Pulses: Posterior Tibial Palpable: [Left:Yes] Dorsalis Pedis Palpable: [Left:Yes] Extremity colors, hair growth, and conditions: Extremity Color: [Left:Hyperpigmented] Hair Growth on Extremity: [Left:Yes] Temperature of Extremity: [Left:Warm] Capillary Refill: [Left:< 3 seconds] Dependent Rubor: [Left:No] Blanched when Elevated: [Left:No] Toe Nail Assessment Left: Right: Thick: No Discolored: No Deformed: No Improper Length and Hygiene: No RENOLD, SUITE ([id number]) Electronic Signature(s) Signed: 04/21/2015 4:30:08 PM By: [name] RN Entered By: [name] on 04/21/2015 11:40:24 Rick Gilmore ([id number]) -------------------------------------------------------------------------------- Multi Wound Chart Details Patient Name: Rick Gilmore Date of Service: 04/21/2015 11:30 AM Medical Record Number: [id number] Patient Account Number: [id number] Date of  Birth/Sex: October 28, 1941 (74 y.o. Male) Treating RN: [name] Primary Care Physician: Deborra Medina Other Clinician: Referring Physician: Deborra Medina Treating Physician/Extender: [name] in Treatment: 5 Vital Signs Height(in): 75 Pulse(bpm): 81 Weight(lbs): 252 Blood Pressure 91/62 (mmHg): Body Mass Index(BMI): 31 Temperature(F): 97.8 Respiratory Rate 16 (breaths/min): Photos: [1:No Photos] [[name]:[name]] Wound Location: [1:Left Lower Leg] [[name]:[name]] Wounding Event: [1:Trauma] [[name]:[name]] Primary Etiology: [1:Trauma, Other] [[name]:[name]] Comorbid History: [1:Anemia, Chronic Obstructive Pulmonary Disease (COPD), Sleep Apnea, Arrhythmia, Congestive Heart Failure, Coronary Artery Disease, Hypertension, Myocardial Infarction, Gout, Received Radiation] [[name]:[name]] Date Acquired: [1:03/04/2015] [[name]:[name]] Weeks of Treatment: [1:5] [[name]:[name]] Wound Status: [1:Open] [[name]:[name]] Measurements L x W x D 1.2x1.5x0.8 [[name]:[name]] (cm) Area (cm) : [  1:1.414] [[name]:[name]] Volume (cm) : [1:1.131] [[name]:[name]] % Reduction in Area: [1:-87.50%] [[name]:[name]] % Reduction in Volume: -649.00% [[name]:[name]] Starting Position 1 6 (o'clock): Ending Position 1 [1:9] (o'clock): Maximum Distance 1 0.8 (cm): Undermining: [1:Yes] [[name]:[name]] Classification: [[name]:[name]] Full Thickness Without Exposed Support Structures Exudate Amount: Medium [name] [name] Exudate Type: Serosanguineous [name] [name] Exudate Color: red, brown [name] [name] Wound Margin: Indistinct, nonvisible [name] [name] Granulation Amount: Large (67-100%) [name] [name] Granulation Quality: Red [name] [name] Necrotic Amount: Small (1-33%) [name] [name] Necrotic Tissue: Eschar [name] [name] Exposed Structures: Fascia: No [name] [name] Fat: No Tendon: No Muscle: No Joint: No Bone: No Limited to Skin Breakdown Epithelialization: None [name] [name] Debridement: Open Wound/Selective [name] [name] [id number]) - Selective Time-Out Taken: Yes [name] [name] Pain Control: Lidocaine 4% Topical [name]  [name] Solution Tissue Debrided: Fibrin/Slough, Exudates [name] [name] Level: Non-Viable Tissue [name] [name] Debridement Area (sq 1.8 [name] [name] cm): Bleeding: Minimum [name] [name] Hemostasis Achieved: Pressure [name] [name] Procedural Pain: 0 [name] [name] Post Procedural Pain: 0 [name] [name] Debridement Treatment Procedure was tolerated [name] [name] Response: well Post Debridement 1.2x1.5x0.8 [name] [name] Measurements L x W x D (cm) Post Debridement 1.131 [name] [name] Volume: (cm) Periwound Skin Texture: Edema: Yes [name] [name] Induration: Yes Excoriation: No Callus: No Crepitus: No Fluctuance: No Friable: No Rash: No Scarring: No No Abnormalities Noted [name] [name] Marrocco, Salahuddin ([id number]) Periwound Skin Moisture: Periwound Skin Color: Erythema: Yes [name] [name] Hemosiderin Staining: Yes Temperature: No Abnormality [name] [name] Tenderness on No [name] [name] Palpation: Wound Preparation: Ulcer Cleansing: [name] [name] Rinsed/Irrigated with Saline Topical Anesthetic Applied: Other: 4% lidocaine cream Procedures Performed: Debridement [name] [name] Treatment Notes Electronic Signature(s) Signed: 04/21/2015 4:30:08 PM By: [name] RN Entered By: [name] on 04/21/2015 11:54:28 Rick Gilmore ([id number]) -------------------------------------------------------------------------------- Multi-Disciplinary Care Plan Details Patient Name: Rick Gilmore Date of Service: 04/21/2015 11:30 AM Medical Record Number: [id number] Patient Account Number: [id number] Date of Birth/Sex: 1941/09/17 (74 y.o. Male) Treating RN: [name] Primary Care Physician: Deborra Medina Other Clinician: Referring Physician: Deborra Medina Treating Physician/Extender: [name] in Treatment: 5 Active Inactive Abuse / Safety / Falls / Self Care Management Nursing Diagnoses: Potential for falls Goals: Patient will remain injury free Date Initiated: 03/17/2015 Goal Status: Active Interventions: Assess fall risk on admission and as  needed Notes: Necrotic Tissue Nursing Diagnoses: Impaired tissue integrity related to necrotic/devitalized tissue Goals: Necrotic/devitalized tissue will be minimized in the wound bed Date Initiated: 03/17/2015 Goal Status: Active Interventions: Assess patient pain level pre-, during and post procedure and prior to discharge Treatment Activities: Apply topical anesthetic as ordered : 04/21/2015 Notes: Orientation to the Wound Care Program Nursing Diagnoses: Knowledge deficit related to the wound healing center program FINDLEY, EWTON ([id number]) Goals: Patient/caregiver will verbalize understanding of the [place] Program Date Initiated: 03/17/2015 Goal Status: Active Interventions: Provide education on orientation to the wound center Notes: Wound/Skin Impairment Nursing Diagnoses: Impaired tissue integrity Goals: Patient/caregiver will verbalize understanding of skin care regimen Date Initiated: 03/17/2015 Goal Status: Active Ulcer/skin breakdown will heal within 14 weeks Date Initiated: 03/17/2015 Goal Status: Active Interventions: Assess patient/caregiver ability to obtain necessary supplies Notes: Electronic Signature(s) Signed: 04/21/2015 4:30:08 PM By: [name] RN Entered By: [name] on 04/21/2015 11:54:19 Rick Gilmore ([id number]) -------------------------------------------------------------------------------- Pain Assessment Details Patient Name: Rick Gilmore Date of Service: 04/21/2015 11:30 AM Medical Record Number: [id number] Patient Account Number: [id number] Date of Birth/Sex: [birth year] (74 y.o. Male) Treating RN: [name] Primary Care Physician: Deborra Medina Other Clinician: Referring Physician: Deborra Medina Treating Physician/Extender: Con Memos,  Errol Weeks in Treatment: 5 Active Problems Location of Pain Severity and Description of Pain Patient Has Paino No Site Locations Pain Management and Medication Current Pain  Management: Electronic Signature(s) Signed: 04/21/2015 4:30:08 PM By: [name] RN Entered By: [name] on 04/21/2015 11:36:27 Etherington, Hurlock ([id number]) -------------------------------------------------------------------------------- Patient/Caregiver Education Details Patient Name: Rick Gilmore Date of Service: 04/21/2015 11:30 AM Medical Record Number: [id number] Patient Account Number: [id number] Date of Birth/Gender: 07/24/1941 (74 y.o. Male) Treating RN: [name] Primary Care Physician: Deborra Medina Other Clinician: Referring Physician: Deborra Medina Treating Physician/Extender: [name] in Treatment: 5 Education Assessment Education Provided To: Patient Education Topics Provided Safety: Methods: Explain/Verbal Responses: State content correctly Wound Debridement: Methods: Explain/Verbal Responses: State content correctly Wound/Skin Impairment: Methods: Explain/Verbal Responses: State content correctly Electronic Signature(s) Signed: 04/21/2015 4:30:08 PM By: [name] RN Entered By: [name] on 04/21/2015 11:56:41 Roell, Mil[place] ([id number]) -------------------------------------------------------------------------------- Wound Assessment Details Patient Name: Rick Gilmore Date of Service: 04/21/2015 11:30 AM Medical Record Number: [id number] Patient Account Number: [id number] Date of Birth/Sex: 05-09-1941 (74 y.o. Male) Treating RN: [name] Primary Care Physician: Deborra Medina Other Clinician: Referring Physician: Deborra Medina Treating Physician/Extender: [name] in Treatment: 5 Wound Status Wound Number: 1 Primary Trauma, Other Etiology: Wound Location: Left Lower Leg Wound Open Wounding Event: Trauma Status: Date Acquired: 03/04/2015 Comorbid Anemia, Chronic Obstructive Pulmonary Weeks Of Treatment: 5 History: Disease (COPD), Sleep Apnea, Clustered Wound: No Arrhythmia, Congestive Heart  Failure, Coronary Artery Disease, Hypertension, Myocardial Infarction, Gout, Received Radiation Photos Photo Uploaded By: [name] on 04/21/2015 14:32:41 Wound Measurements Length: (cm) 1.2 Width: (cm) 1.5 Depth: (cm) 0.8 Area: (cm) 1.414 Volume: (cm) 1.131 % Reduction in Area: -87.5% % Reduction in Volume: -649% Epithelialization: None Undermining: Yes Starting Position (o'clock): 6 Ending Position (o'clock): 9 Maximum Distance: (cm) 0.8 Wound Description Full Thickness Without Exposed Classification: Support Structures Wound Margin: Indistinct, nonvisible Exudate Medium Amount: Dusseau, Cristhian ([id number]) Foul Odor After Cleansing: No Exudate Type: Serosanguineous Exudate Color: red, brown Wound Bed Granulation Amount: Large (67-100%) Exposed Structure Granulation Quality: Red Fascia Exposed: No Necrotic Amount: Small (1-33%) Fat Layer Exposed: No Necrotic Quality: Eschar Tendon Exposed: No Muscle Exposed: No Joint Exposed: No Bone Exposed: No Limited to Skin Breakdown Periwound Skin Texture Texture Color No Abnormalities Noted: No No Abnormalities Noted: No Callus: No Erythema: Yes Crepitus: No Hemosiderin Staining: Yes Excoriation: No Temperature / Pain Fluctuance: No Temperature: No Abnormality Friable: No Induration: Yes Localized Edema: Yes Rash: No Scarring: No Moisture No Abnormalities Noted: Yes Wound Preparation Ulcer Cleansing: Rinsed/Irrigated with Saline Topical Anesthetic Applied: Other: 4% lidocaine cream, Treatment Notes Wound #1 (Left Lower Leg) 1. Cleansed with: Clean wound with Normal Saline 2. Anesthetic Topical Lidocaine 4% cream to wound bed prior to debridement 4. Dressing Applied: Other dressing (specify in notes) 5. Secondary Dressing Applied Bordered Foam Dressing Notes iodosorb onto plain packing strip. stretch net used to secure dressing Electronic Signature(s) ZACHREY, FRISCO ([id number]) Signed:  04/21/2015 4:30:08 PM By: [name] RN Entered By: [name] on 04/21/2015 11:45:07 Rick Gilmore ([id number]) -------------------------------------------------------------------------------- Vitals Details Patient Name: Rick Gilmore Date of Service: 04/21/2015 11:30 AM Medical Record Number: [id number] Patient Account Number: [id number] Date of Birth/Sex: [birth year] (74 y.o. Male) Treating RN: [name] Primary Care Physician: Deborra Medina Other Clinician: Referring Physician: Deborra Medina Treating Physician/Extender: [name] in Treatment: 5 Vital Signs Time Taken: 11:35 Temperature (F): 97.8 Height (in): 75 Pulse (bpm): 81 Weight (lbs): 252 Respiratory Rate (breaths/min): 16 Body Mass Index (BMI): 31.5  Blood Pressure (mmHg): 91/62 Reference Range: 80 - 120 mg / dl Electronic Signature(s) Signed: 04/21/2015 4:30:08 PM By: [name] RN Entered By: [name] on 04/21/2015 11:37:40

## 2015-04-21 NOTE — Addendum Note (Signed)
Addended by: Karlene Einstein D on: 04/21/2015 03:28 PM   Modules accepted: Orders

## 2015-04-22 LAB — BASIC METABOLIC PANEL
BUN: 38 mg/dL — ABNORMAL HIGH (ref 6–23)
CO2: 28 mEq/L (ref 19–32)
Calcium: 9.8 mg/dL (ref 8.4–10.5)
Chloride: 98 mEq/L (ref 96–112)
Creatinine, Ser: 1.29 mg/dL (ref 0.40–1.50)
GFR: 57.93 mL/min — ABNORMAL LOW (ref >60.00)
Glucose, Bld: 70 mg/dL (ref 70–99)
Potassium: 5.3 mEq/L — ABNORMAL HIGH (ref 3.5–5.1)
Sodium: 131 mEq/L — ABNORMAL LOW (ref 135–145)

## 2015-04-22 LAB — MAGNESIUM: Magnesium: 2.6 mg/dL — ABNORMAL HIGH (ref 1.5–2.5)

## 2015-04-23 ENCOUNTER — Other Ambulatory Visit: Payer: Self-pay | Admitting: Internal Medicine

## 2015-04-23 ENCOUNTER — Telehealth: Payer: Self-pay | Admitting: Internal Medicine

## 2015-04-23 ENCOUNTER — Encounter: Payer: Self-pay | Admitting: Internal Medicine

## 2015-04-23 DIAGNOSIS — E875 Hyperkalemia: Secondary | ICD-10-CM

## 2015-04-23 NOTE — Telephone Encounter (Signed)
Mr Seago has requested a change in pulmonologist  to Dr. Stevenson Clinch from Dr Raul Del

## 2015-04-24 ENCOUNTER — Other Ambulatory Visit (INDEPENDENT_AMBULATORY_CARE_PROVIDER_SITE_OTHER): Payer: Medicare Other

## 2015-04-24 ENCOUNTER — Encounter: Payer: Self-pay | Admitting: Internal Medicine

## 2015-04-24 DIAGNOSIS — E875 Hyperkalemia: Secondary | ICD-10-CM | POA: Diagnosis not present

## 2015-04-24 LAB — BASIC METABOLIC PANEL
BUN: 32 mg/dL — ABNORMAL HIGH (ref 6–23)
CO2: 27 mEq/L (ref 19–32)
Calcium: 9.4 mg/dL (ref 8.4–10.5)
Chloride: 102 mEq/L (ref 96–112)
Creatinine, Ser: 1.15 mg/dL (ref 0.40–1.50)
GFR: 66.14 mL/min (ref >60.00)
Glucose, Bld: 76 mg/dL (ref 70–99)
Potassium: 4.6 mEq/L (ref 3.5–5.1)
Sodium: 134 mEq/L — ABNORMAL LOW (ref 135–145)

## 2015-04-26 ENCOUNTER — Other Ambulatory Visit: Payer: Self-pay | Admitting: Internal Medicine

## 2015-04-28 ENCOUNTER — Encounter: Payer: Medicare Other | Admitting: Surgery

## 2015-04-28 DIAGNOSIS — R600 Localized edema: Secondary | ICD-10-CM | POA: Diagnosis not present

## 2015-04-28 DIAGNOSIS — J449 Chronic obstructive pulmonary disease, unspecified: Secondary | ICD-10-CM | POA: Diagnosis not present

## 2015-04-28 DIAGNOSIS — L97222 Non-pressure chronic ulcer of left calf with fat layer exposed: Secondary | ICD-10-CM | POA: Diagnosis not present

## 2015-04-28 DIAGNOSIS — S81812A Laceration without foreign body, left lower leg, initial encounter: Secondary | ICD-10-CM | POA: Diagnosis not present

## 2015-04-28 DIAGNOSIS — I2510 Atherosclerotic heart disease of native coronary artery without angina pectoris: Secondary | ICD-10-CM | POA: Diagnosis not present

## 2015-04-28 DIAGNOSIS — I10 Essential (primary) hypertension: Secondary | ICD-10-CM | POA: Diagnosis not present

## 2015-04-29 ENCOUNTER — Ambulatory Visit: Payer: TRICARE For Life (TFL) | Admitting: Family

## 2015-04-29 NOTE — Progress Notes (Signed)
Rick Gilmore, Rick Gilmore ([id number]) Visit Report for 04/28/2015 Arrival Information Details Patient Name: Rick Gilmore, Rick Gilmore Date of Service: 04/28/2015 8:45 AM Medical Record Number: [id number] Patient Account Number: [id number] Date of Birth/Sex: 21-Dec-1940 (74 y.o. Male) Treating RN: Rick Gilmore Primary Care Physician: Rick Gilmore Other Clinician: Referring Physician: Deborra Gilmore Treating Physician/Extender: Rick Gilmore in Treatment: 6 Visit Information History Since Last Visit Added or deleted any medications: Yes Patient Arrived: Ambulatory Any new allergies or adverse reactions: No Arrival Time: 11:30 Had a fall or experienced change in No Accompanied By: self activities of daily living that may affect Transfer Assistance: None risk of falls: Patient Identification Verified: Yes Signs or symptoms of abuse/neglect since last No Secondary Verification Process Yes visito Completed: Hospitalized since last visit: No Patient Requires Transmission- No Has Dressing in Place as Prescribed: Yes Based Precautions: Pain Present Now: No Patient Has Alerts: Yes Patient Alerts: Patient on Blood Thinner Electronic Signature(s) Signed: 04/28/2015 5:30:05 PM By: Rick Dresser RN Entered By: Rick Gilmore on 04/28/2015 11:31:38 Rick Gilmore ([id number]) -------------------------------------------------------------------------------- Clinic Level of Care Assessment Details Patient Name: Rick Gilmore, Rick Gilmore Date of Service: 04/28/2015 8:45 AM Medical Record Number: [id number] Patient Account Number: [id number] Date of Birth/Sex: 1941/04/02 (73 y.o. Male) Treating RN: Rick Gilmore Primary Care Physician: Rick Gilmore Other Clinician: Referring Physician: Deborra Gilmore Treating Physician/Extender: Rick Gilmore in Treatment: 6 Clinic Level of Care Assessment Items TOOL 4 Quantity Score []  - Use when only an EandM is performed on FOLLOW-UP visit 0 ASSESSMENTS - Nursing Assessment  / Reassessment X - Reassessment of Co-morbidities (includes updates in patient status) 1 10 X - Reassessment of Adherence to Treatment Plan 1 5 ASSESSMENTS - Wound and Skin Assessment / Reassessment X - Simple Wound Assessment / Reassessment - one wound 1 5 []  - Complex Wound Assessment / Reassessment - multiple wounds 0 []  - Dermatologic / Skin Assessment (not related to wound area) 0 ASSESSMENTS - Focused Assessment X - Circumferential Edema Measurements - multi extremities 1 5 []  - Nutritional Assessment / Counseling / Intervention 0 X - Lower Extremity Assessment (monofilament, tuning fork, pulses) 1 5 []  - Peripheral Arterial Disease Assessment (using hand held doppler) 0 ASSESSMENTS - Ostomy and/or Continence Assessment and Care []  - Incontinence Assessment and Management 0 []  - Ostomy Care Assessment and Management (repouching, etc.) 0 PROCESS - Coordination of Care X - Simple Patient / Family Education for ongoing care 1 15 []  - Complex (extensive) Patient / Family Education for ongoing care 0 []  - Staff obtains Programmer, systems, Records, Test Results / Process Orders 0 []  - Staff telephones HHA, Nursing Homes / Clarify orders / etc 0 []  - Routine Transfer to another Facility (non-emergent condition) 0 Rick Gilmore ([id number]) []  - Routine Hospital Admission (non-emergent condition) 0 []  - New Admissions / Biomedical engineer / Ordering NPWT, Apligraf, etc. 0 []  - Emergency Hospital Admission (emergent condition) 0 X - Simple Discharge Coordination 1 10 []  - Complex (extensive) Discharge Coordination 0 PROCESS - Special Needs []  - Pediatric / Minor Patient Management 0 []  - Isolation Patient Management 0 []  - Hearing / Language / Visual special needs 0 []  - Assessment of Community assistance (transportation, D/C planning, etc.) 0 []  - Additional assistance / Altered mentation 0 []  - Support Surface(s) Assessment (bed, cushion, seat, etc.) 0 INTERVENTIONS - Wound Cleansing /  Measurement X - Simple Wound Cleansing - one wound 1 5 []  - Complex Wound Cleansing - multiple wounds 0 X - Wound Imaging (photographs - any number of wounds)  1 5 []  - Wound Tracing (instead of photographs) 0 X - Simple Wound Measurement - one wound 1 5 []  - Complex Wound Measurement - multiple wounds 0 INTERVENTIONS - Wound Dressings X - Small Wound Dressing one or multiple wounds 1 10 []  - Medium Wound Dressing one or multiple wounds 0 []  - Large Wound Dressing one or multiple wounds 0 []  - Application of Medications - topical 0 []  - Application of Medications - injection 0 INTERVENTIONS - Miscellaneous []  - External ear exam 0 Rick Gilmore ([id number]) []  - Specimen Collection (cultures, biopsies, blood, body fluids, etc.) 0 []  - Specimen(s) / Culture(s) sent or taken to Lab for analysis 0 []  - Patient Transfer (multiple staff / [employer] fast streamer / Similar devices) 0 []  - Simple Staple / Suture removal (25 or less) 0 []  - Complex Staple / Suture removal (26 or more) 0 []  - Hypo / Hyperglycemic Management (close monitor of Blood Glucose) 0 []  - Ankle / Brachial Index (ABI) - do not check if billed separately 0 X - Vital Signs 1 5 Has the patient been seen at the hospital within the last three years: Yes Total Score: 85 Level Of Care: New/Established - Level 3 Electronic Signature(s) Signed: 04/28/2015 3:58:57 PM By: Rick Gilmore Entered By: Rick Gilmore on 04/28/2015 15:58:55 Rick Gilmore ([id number]) -------------------------------------------------------------------------------- Encounter Discharge Information Details Patient Name: Rick Gilmore Date of Service: 04/28/2015 8:45 AM Medical Record Number: [id number] Patient Account Number: [id number] Date of Birth/Sex: September 19, 1941 (74 y.o. Male) Treating RN: Primary Care Physician: Rick Gilmore Other Clinician: Referring Physician: Deborra Gilmore Treating Physician/Extender: Rick Gilmore in Treatment: 6 Encounter  Discharge Information Items Schedule Follow-up Appointment: No Medication Reconciliation completed No and provided to Patient/Care Rick Gilmore: Provided on Clinical Summary of Care: 04/28/2015 Form Type Recipient Paper Patient [facility] Electronic Signature(s) Signed: 04/28/2015 11:59:08 AM By: Ruthine Dose Entered By: Ruthine Dose on 04/28/2015 11:59:08 Rick Gilmore ([id number]) -------------------------------------------------------------------------------- Lower Extremity Assessment Details Patient Name: Rick Gilmore Date of Service: 04/28/2015 8:45 AM Medical Record Number: [id number] Patient Account Number: [id number] Date of Birth/Sex: 10/23/1941 (73 y.o. Male) Treating RN: Rick Gilmore Primary Care Physician: Rick Gilmore Other Clinician: Referring Physician: Deborra Gilmore Treating Physician/Extender: Rick Gilmore in Treatment: 6 Edema Assessment Assessed: Shirlyn Goltz: Yes] [Right: No] Edema: [Left: Ye] [Right: s] Calf Left: Right: Point of Measurement: 33 cm From Medial Instep 40.7 cm cm Ankle Left: Right: Point of Measurement: 11 cm From Medial Instep 23.6 cm cm Vascular Assessment Claudication: Claudication Assessment [Left:None] Pulses: Posterior Tibial Palpable: [Left:Yes] Dorsalis Pedis Palpable: [Left:Yes] Extremity colors, hair growth, and conditions: Extremity Color: [Left:Normal] Hair Growth on Extremity: [Left:No] Temperature of Extremity: [Left:Warm] Capillary Refill: [Left:< 3 seconds] Dependent Rubor: [Left:No] Blanched when Elevated: [Left:No] Toe Nail Assessment Left: Right: Thick: No Discolored: No Deformed: No Improper Length and Hygiene: No Rick Gilmore, Rick Gilmore ([id number]) Electronic Signature(s) Signed: 04/28/2015 5:30:05 PM By: Rick Dresser RN Entered By: Rick Gilmore on 04/28/2015 11:36:42 Rick Gilmore, Rick Gilmore ([id number]) -------------------------------------------------------------------------------- Multi Wound Chart Details Patient Name:  Rick Gilmore Date of Service: 04/28/2015 8:45 AM Medical Record Number: [id number] Patient Account Number: [id number] Date of Birth/Sex: 1941/06/02 (74 y.o. Male) Treating RN: Rick Gilmore Primary Care Physician: Rick Gilmore Other Clinician: Referring Physician: Deborra Gilmore Treating Physician/Extender: Rick Gilmore in Treatment: 6 Vital Signs Height(in): 75 Pulse(bpm): 76 Weight(lbs): 252 Blood Pressure 115/54 (mmHg): Body Mass Index(BMI): 31 Temperature(F): 97.8 Respiratory Rate 12 (breaths/min): Photos: [1:No Photos] [[name]:[name]] Wound Location: [1:Left Lower Leg] [[name]:[name]] Wounding Event: [1:Trauma] [[name]:[name]] Primary Etiology: [1:Trauma, Other] [[name]:[name]]  Comorbid History: [1:Anemia, Chronic Obstructive Pulmonary Disease (COPD), Sleep Apnea, Arrhythmia, Congestive Heart Failure, Coronary Artery Disease, Hypertension, Myocardial Infarction, Gout, Received Radiation] [[name]:[name]] Date Acquired: [1:03/04/2015] [[name]:[name]] Weeks of Treatment: [1:6] [[name]:[name]] Wound Status: [1:Open] [[name]:[name]] Measurements L x W x D 1.9x1.1x0.7 [[name]:[name]] (cm) Area (cm) : [1:1.641] [[name]:[name]] Volume (cm) : [1:1.149] [[name]:[name]] % Reduction in Area: [1:-117.60%] [[name]:[name]] % Reduction in Volume: -660.90% [[name]:[name]] Starting Position 1 6 (o'clock): Ending Position 1 [1:9] (o'clock): Maximum Distance 1 0.7 (cm): Undermining: [1:Yes] [[name]:[name]] Classification: [[name]:[name]] Full Thickness Without Exposed Support Structures Exudate Amount: Medium [name] [name] Exudate Type: Serosanguineous [name] [name] Exudate Color: red, brown [name] [name] Wound Margin: Indistinct, nonvisible [name] [name] Granulation Amount: Large (67-100%) [name] [name] Granulation Quality: Red [name] [name] Necrotic Amount: Small (1-33%) [name] [name] Necrotic Tissue: Eschar [name] [name] Exposed Structures: Fascia: No [name] [name] Fat: No Tendon: No Muscle: No Joint: No Bone: No Limited to Skin Breakdown Epithelialization: Small (1-33%) [name] [name] Periwound Skin  Texture: Edema: Yes [name] [name] Induration: Yes Excoriation: No Callus: No Crepitus: No Fluctuance: No Friable: No Rash: No Scarring: No Periwound Skin No Abnormalities Noted [name] [name] Moisture: Periwound Skin Color: Erythema: Yes [name] [name] Hemosiderin Staining: Yes Temperature: No Abnormality [name] [name] Tenderness on No [name] [name] Palpation: Wound Preparation: Ulcer Cleansing: [name] [name] Rinsed/Irrigated with Saline Topical Anesthetic Applied: Other: 4% lidocaine cream Treatment Notes Electronic Signature(s) Signed: 04/28/2015 5:30:05 PM By: Rick Dresser RN Entered By: Rick Gilmore on 04/28/2015 11:49:15 Rick Gilmore, Rick Gilmore ([id number]) Rick Gilmore, Rick Gilmore ([id number]) -------------------------------------------------------------------------------- Multi-Disciplinary Care Plan Details Patient Name: Rick Gilmore, Rick Gilmore Date of Service: 04/28/2015 8:45 AM Medical Record Number: [id number] Patient Account Number: [id number] Date of Birth/Sex: November 23, 1941 (74 y.o. Male) Treating RN: Rick Gilmore Primary Care Physician: Rick Gilmore Other Clinician: Referring Physician: Deborra Gilmore Treating Physician/Extender: Rick Gilmore in Treatment: 6 Active Inactive Abuse / Safety / Falls / Self Care Management Nursing Diagnoses: Potential for falls Goals: Patient will remain injury free Date Initiated: 03/17/2015 Goal Status: Active Interventions: Assess fall risk on admission and as needed Notes: Necrotic Tissue Nursing Diagnoses: Impaired tissue integrity related to necrotic/devitalized tissue Goals: Necrotic/devitalized tissue will be minimized in the wound bed Date Initiated: 03/17/2015 Goal Status: Active Interventions: Assess patient pain level pre-, during and post procedure and prior to discharge Treatment Activities: Apply topical anesthetic as ordered : 04/28/2015 Notes: Orientation to the Wound Care Program Nursing Diagnoses: Knowledge deficit related to the wound healing center  program Rick Gilmore, Rick Gilmore ([id number]) Goals: Patient/caregiver will verbalize understanding of the [facility] Date Initiated: 03/17/2015 Goal Status: Active Interventions: Provide education on orientation to the wound center Notes: Wound/Skin Impairment Nursing Diagnoses: Impaired tissue integrity Goals: Patient/caregiver will verbalize understanding of skin care regimen Date Initiated: 03/17/2015 Goal Status: Active Ulcer/skin breakdown will heal within 14 weeks Date Initiated: 03/17/2015 Goal Status: Active Interventions: Assess patient/caregiver ability to obtain necessary supplies Notes: Electronic Signature(s) Signed: 04/28/2015 5:30:05 PM By: Rick Dresser RN Entered By: Rick Gilmore on 04/28/2015 11:49:00 Rick Gilmore ([id number]) -------------------------------------------------------------------------------- Pain Assessment Details Patient Name: Rick Gilmore Date of Service: 04/28/2015 8:45 AM Medical Record Number: [id number] Patient Account Number: [id number] Date of Birth/Sex: 1941/10/18 (74 y.o. Male) Treating RN: Rick Gilmore Primary Care Physician: Rick Gilmore Other Clinician: Referring Physician: Deborra Gilmore Treating Physician/Extender: Rick Gilmore in Treatment: 6 Active Problems Location of Pain Severity and Description of Pain Patient Has Paino Yes Site Locations Pain Management and Medication Current Pain Management: Electronic Signature(s) Signed: 04/28/2015 5:30:05 PM By: Rick Dresser RN Entered By: Rick Gilmore  on 04/28/2015 11:32:09 Rick Gilmore, Rick Gilmore ([id number]) -------------------------------------------------------------------------------- Patient/Caregiver Education Details Patient Name: Rick Gilmore, LOPES Date of Service: 04/28/2015 8:45 AM Medical Record Number: [id number] Patient Account Number: [id number] Date of Birth/Gender: Apr 09, 1941 (75 y.o. Male) Treating RN: Rick Gilmore Primary Care Physician: Rick Gilmore Other Clinician: Referring Physician: Deborra Gilmore Treating Physician/Extender: Rick Gilmore in Treatment: 6 Education Assessment Education Provided To: Patient Education Topics Provided Safety: Methods: Explain/Verbal Responses: State content correctly Wound/Skin Impairment: Methods: Explain/Verbal Responses: State content correctly Electronic Signature(s) Signed: 04/28/2015 5:30:05 PM By: Rick Dresser RN Entered By: Rick Gilmore on 04/28/2015 11:59:30 Rick Gilmore, Rick Gilmore ([id number]) -------------------------------------------------------------------------------- Wound Assessment Details Patient Name: Rick Gilmore Date of Service: 04/28/2015 8:45 AM Medical Record Number: [id number] Patient Account Number: [id number] Date of Birth/Sex: 13-Apr-1941 (74 y.o. Male) Treating RN: Rick Gilmore Primary Care Physician: Rick Gilmore Other Clinician: Referring Physician: Deborra Gilmore Treating Physician/Extender: Rick Gilmore in Treatment: 6 Wound Status Wound Number: 1 Primary Trauma, Other Etiology: Wound Location: Left Lower Leg Wound Open Wounding Event: Trauma Status: Date Acquired: 03/04/2015 Comorbid Anemia, Chronic Obstructive Pulmonary Weeks Of Treatment: 6 History: Disease (COPD), Sleep Apnea, Clustered Wound: No Arrhythmia, Congestive Heart Failure, Coronary Artery Disease, Hypertension, Myocardial Infarction, Gout, Received Radiation Photos Photo Uploaded By: Rick Gilmore on 04/28/2015 16:03:48 Wound Measurements Length: (cm) 1.9 Width: (cm) 1.1 Depth: (cm) 0.7 Area: (cm) 1.641 Volume: (cm) 1.149 % Reduction in Area: -117.6% % Reduction in Volume: -660.9% Epithelialization: Small (1-33%) Tunneling: No Undermining: Yes Starting Position (o'clock): 6 Ending Position (o'clock): 9 Maximum Distance: (cm) 0.7 Wound Description Full Thickness Without Exposed Classification: Support Structures Wound Margin: Indistinct,  nonvisible Medium Dina, Whitley ([id number]) Foul Odor After Cleansing: No Exudate Amount: Exudate Type: Serosanguineous Exudate Color: red, brown Wound Bed Granulation Amount: Large (67-100%) Exposed Structure Granulation Quality: Red Fascia Exposed: No Necrotic Amount: Small (1-33%) Fat Layer Exposed: No Necrotic Quality: Eschar Tendon Exposed: No Muscle Exposed: No Joint Exposed: No Bone Exposed: No Limited to Skin Breakdown Periwound Skin Texture Texture Color No Abnormalities Noted: No No Abnormalities Noted: No Callus: No Erythema: Yes Crepitus: No Hemosiderin Staining: Yes Excoriation: No Temperature / Pain Fluctuance: No Temperature: No Abnormality Friable: No Induration: Yes Localized Edema: Yes Rash: No Scarring: No Moisture No Abnormalities Noted: Yes Wound Preparation Ulcer Cleansing: Rinsed/Irrigated with Saline Topical Anesthetic Applied: Other: 4% lidocaine cream, Treatment Notes Wound #1 (Left Lower Leg) 1. Cleansed with: Clean wound with Normal Saline 2. Anesthetic Topical Lidocaine 4% cream to wound bed prior to debridement 4. Dressing Applied: Iodosorb Ointment Plain packing gauze 5. Secondary Dressing Applied Bordered Foam Dressing Notes BENEDETTO, VIDAURRI ([id number]) iodosorb onto plain packing strip. stretch net used to secure dressing Electronic Signature(s) Signed: 04/28/2015 5:30:05 PM By: Rick Dresser RN Entered By: Rick Gilmore on 04/28/2015 11:39:05 Rick Gilmore ([id number]) -------------------------------------------------------------------------------- [facility] Details Patient Name: Rick Gilmore Date of Service: 04/28/2015 8:45 AM Medical Record Number: [id number] Patient Account Number: [id number] Date of Birth/Sex: 1941/04/12 (74 y.o. Male) Treating RN: Rick Gilmore Primary Care Physician: Rick Gilmore Other Clinician: Referring Physician: Deborra Gilmore Treating Physician/Extender: Rick Gilmore in Treatment:  6 Vital Signs Time Taken: 11:30 Temperature (F): 97.8 Height (in): 75 Pulse (bpm): 76 Weight (lbs): 252 Respiratory Rate (breaths/min): 12 Body Mass Index (BMI): 31.5 Blood Pressure (mmHg): 115/54 Reference Range: 80 - 120 mg / dl Electronic Signature(s) Signed: 04/28/2015 5:30:05 PM By: Rick Dresser RN Entered By: Rick Gilmore on 04/28/2015 11:32:34

## 2015-04-29 NOTE — Progress Notes (Signed)
Rick Gilmore ([id number]) Visit Report for 04/28/2015 Chief Complaint Document Details Patient Name: Rick Gilmore, Rick Gilmore Date of Service: 04/28/2015 8:45 AM Medical Record Number: [id number] Patient Account Number: [id number] Date of Birth/Sex: August 30, 1941 (74 y.o. Male) Treating RN: Primary Care Physician: Deborra Medina Other Clinician: Referring Physician: Deborra Medina Treating Physician/Extender: [name] in Treatment: 6 Information Obtained from: Patient Chief Complaint Patient presents to the wound care center for a consult due non healing wound 74 year old gentleman comes with a history of having a injury to his left lower extremity on 03/04/15,which was seen in the ER about 10 days ago. Electronic Signature(s) Signed: 04/28/2015 12:55:50 PM By: [name] MD, FACS Entered By: [name] on 04/28/2015 11:55:19 Rick Gilmore, Rick Gilmore ([id number]) -------------------------------------------------------------------------------- HPI Details Patient Name: Rick Gilmore Date of Service: 04/28/2015 8:45 AM Medical Record Number: [id number] Patient Account Number: [id number] Date of Birth/Sex: 1941/04/07 (74 y.o. Male) Treating RN: Primary Care Physician: Deborra Medina Other Clinician: Referring Physician: Deborra Medina Treating Physician/Extender: [name] in Treatment: 6 History of Present Illness Location: left lower extremity Quality: Patient reports experiencing a dull pain to affected area(s). Severity: Patient states wound (s) are getting better. Duration: Patient has had the wound for < 2 weeks prior to presenting for treatment Timing: Pain in wound is Intermittent (comes and goes Context: The wound occurred when the patient after he had a fall and hurt himself. Modifying Factors: Patient must stand for long periods while working Associated Signs and Symptoms: Patient reports presence of swelling HPI Description: 74 year old gentleman who has a past medical  history significant for gout, edema of the lower limbs, anemia, hypertension, acid reflux, depression, COPD, coronary artery disease and history of sleep apnea. He has had a bypass surgery in 1988 and has had prostate cancer with radiation and seed implants in 2002. He is also known to have a pacemaker and defibrillator placed in 2014. he has been seen in the ER on 3 different occasions since the fall and they have been applying a dry dressing and some labs were done the last time around. No x-rays or ultrasound has been done. The patient is on an anticoagulant and this has not been held at any stage. During his ER notes from 03/10/2015 is noted that he had a wound on the left lower extremity which she's had for about 10 days now. The patient was noted to have extensive ecchymosis and tenderness and a laceration to the left lower extremity. Note is made of the fact that he had no signs of cellulitis and his labs were normal. He was given symptomatic treatment and asked to follow-up in the wound center. 03/24/2015 -- he has been doing fine and his dressing has been done by his family members and is at no issues. His treatment the blood thinner continuous. He will be going out of town this week and will be able to see as next week on Tuesday. Electronic Signature(s) Signed: 04/28/2015 12:55:50 PM By: [name] MD, FACS Entered By: [name] on 04/28/2015 11:55:28 Rick Gilmore, Rick Gilmore ([id number]) -------------------------------------------------------------------------------- Physical Exam Details Patient Name: Rick Gilmore Date of Service: 04/28/2015 8:45 AM Medical Record Number: [id number] Patient Account Number: [id number] Date of Birth/Sex: Apr 23, 1941 (74 y.o. Male) Treating RN: Primary Care Physician: Deborra Medina Other Clinician: Referring Physician: Deborra Medina Treating Physician/Extender: [name] in Treatment: 6 Constitutional . Pulse regular. Respirations normal  and unlabored. Afebrile. . Eyes Nonicteric. Reactive to light. Ears, Nose, Mouth, and Throat Lips, teeth, and gums WNL.Marland Kitchen Moist mucosa without lesions .  Neck supple and nontender. No palpable supraclavicular or cervical adenopathy. Normal sized without goiter. Respiratory WNL. No retractions.. Cardiovascular Pedal Pulses WNL. No clubbing, cyanosis or edema. Integumentary (Hair, Skin) the wound is clean and granulating well and there is undermining between the 6 to the 8:00 position.. No crepitus or fluctuance. No peri-wound warmth or erythema. No masses.Marland Kitchen Psychiatric Judgement and insight Intact.. No evidence of depression, anxiety, or agitation.. Electronic Signature(s) Signed: 04/28/2015 12:55:50 PM By: [name] MD, FACS Entered By: [name] on 04/28/2015 11:56:20 Rick Gilmore ([id number]) -------------------------------------------------------------------------------- Physician Orders Details Patient Name: Rick Gilmore Date of Service: 04/28/2015 8:45 AM Medical Record Number: [id number] Patient Account Number: [id number] Date of Birth/Sex: [birth year] (74 y.o. Male) Treating RN: [name] Primary Care Physician: Deborra Medina Other Clinician: Referring Physician: Deborra Medina Treating Physician/Extender: [name] in Treatment: 6 Verbal / Phone Orders: Yes Clinician: [name] Read Back and Verified: Yes Diagnosis Coding Wound Cleansing Wound #1 Left Lower Leg o Clean wound with Normal Saline. Anesthetic Wound #1 Left Lower Leg o Topical Lidocaine 4% cream applied to wound bed prior to debridement Primary Wound Dressing Wound #1 Left Lower Leg o Iodosorb Ointment - on packing strip into undermined area o Plain packing gauze Secondary Dressing Wound #1 Left Lower Leg o Boardered Foam Dressing Dressing Change Frequency Wound #1 Left Lower Leg o Change dressing every other day. Follow-up Appointments Wound #1 Left Lower  Leg o Return Appointment in 1 week. Additional Orders / Instructions Wound #1 Left Lower Leg o Increase protein intake. o Activity as tolerated Electronic Signature(s) Signed: 04/28/2015 12:55:50 PM By: [name] MD, FACS Signed: 04/28/2015 5:30:05 PM By: [name] RN Rick Gilmore, Rick Gilmore ([id number]) Entered By: [name] on 04/28/2015 11:50:28 Rick Gilmore, Rick Gilmore ([id number]) -------------------------------------------------------------------------------- Problem List Details Patient Name: Rick Gilmore Date of Service: 04/28/2015 8:45 AM Medical Record Number: [id number] Patient Account Number: [id number] Date of Birth/Sex: 29-Apr-1941 (74 y.o. 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 Male) Treating RN: Primary Care Physician: Deborra Medina Other Clinician: Referring Physician: Deborra Medina Treating Physician/Extender: [name] in Treatment: 6 Subjective Chief Complaint Information obtained from Patient Patient presents to the wound care center for a consult due non healing wound 74 year old gentleman comes with a  history of having a injury to his left lower extremity on 03/04/15,which was seen in the ER about 10 days ago. History of Present Illness (HPI) The following HPI elements were documented for the patient's wound: Location: left lower extremity Quality: Patient reports experiencing a dull pain to affected area(s). Severity: Patient states wound (s) are getting better. Duration: Patient has had the wound for < 2 weeks prior to presenting for treatment Timing: Pain in wound is Intermittent (comes and goes Context: The wound occurred when the patient after he had a fall and hurt himself. Modifying Factors: Patient must stand for long periods while working Associated Signs and Symptoms: Patient reports presence  of swelling 74 year old gentleman who has a past medical history significant for gout, edema of the lower limbs, anemia, hypertension, acid reflux, depression, COPD, coronary artery disease and history of sleep apnea. He has had a bypass surgery in 1988 and has had prostate cancer with radiation and seed implants in 2002. He is also known to have a pacemaker and defibrillator placed in 2014. he has been seen in the ER on 3 different occasions since the fall and they have been applying a dry dressing and some labs were done the last time around. No x-rays or ultrasound has been done. The patient is on an anticoagulant and this has not been held at any stage. During his ER notes from 03/10/2015 is noted that he had a wound on the left lower extremity which she's had for about 10 days now. The patient was noted to have extensive ecchymosis and tenderness and a laceration to the left lower extremity. Note is made of the fact that he had no signs of cellulitis and his labs were normal. He was given symptomatic treatment and asked to follow-up in the wound center. 03/24/2015 -- he has been doing fine and his dressing has been done by his family members and is at no issues. His treatment the blood  thinner continuous. He will be going out of town this week and will be able to see as next week on Tuesday. Rick Gilmore, Rick Gilmore ([id number]) Objective Constitutional Pulse regular. Respirations normal and unlabored. Afebrile. Vitals Time Taken: 11:30 AM, Height: 75 in, Weight: 252 lbs, BMI: 31.5, Temperature: 97.8 F, Pulse: 76 bpm, Respiratory Rate: 12 breaths/min, Blood Pressure: 115/54 mmHg. Eyes Nonicteric. Reactive to light. Ears, Nose, Mouth, and Throat Lips, teeth, and gums WNL.Marland Kitchen Moist mucosa without lesions . Neck supple and nontender. No palpable supraclavicular or cervical adenopathy. Normal sized without goiter. Respiratory WNL. No retractions.. Cardiovascular Pedal Pulses WNL. No clubbing, cyanosis or edema. Psychiatric Judgement and insight Intact.. No evidence of depression, anxiety, or agitation.. Integumentary (Hair, Skin) the wound is clean and granulating well and there is undermining between the 6 to the 8:00 position.. No crepitus or fluctuance. No peri-wound warmth or erythema. No masses.. Wound #1 status is Open. Original cause of wound was Trauma. The wound is located on the Left Lower Leg. The wound measures 1.9cm length x 1.1cm width x 0.7cm depth; 1.641cm^2 area and 1.149cm^3 volume. The wound is limited to skin breakdown. There is no tunneling noted, however, there is undermining starting at 6:00 and ending at 9:00 with a maximum distance of 0.7cm. There is a medium amount of serosanguineous drainage noted. The wound margin is indistinct and nonvisible. There is large (67-100%) red granulation within the wound bed. There is a small (1-33%) amount of necrotic tissue within the wound bed including Eschar. The periwound skin appearance had no abnormalities noted for moisture. The periwound skin appearance exhibited: Induration, Localized Edema, Hemosiderin Staining, Erythema. The periwound skin appearance did not exhibit: Callus, Crepitus, Excoriation, Fluctuance,  Friable, Rash, Scarring. The surrounding wound skin color is noted with erythema. Periwound temperature was noted as No Abnormality. Rick Gilmore, Rick Gilmore ([id number]) The wound is clean and granulating well and there is undermining between the 6 to the 8:00 position. Assessment Active Problems ICD-10 L97.222 - Non-pressure chronic ulcer of left calf with fat layer exposed S81.812A - Laceration without foreign body, left lower leg, initial encounter Plan Wound Cleansing: Wound #1 Left Lower Leg: Clean wound with Normal Saline. Anesthetic: Wound #1 Left Lower Leg: Topical Lidocaine 4%  cream applied to wound bed prior to debridement Primary Wound Dressing: Wound #1 Left Lower Leg: Iodosorb Ointment - on packing strip into undermined area Plain packing gauze Secondary Dressing: Wound #1 Left Lower Leg: Boardered Foam Dressing Dressing Change Frequency: Wound #1 Left Lower Leg: Change dressing every other day. Follow-up Appointments: Wound #1 Left Lower Leg: Return Appointment in 1 week. Additional Orders / Instructions: Wound #1 Left Lower Leg: Increase protein intake. 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 He will come back and see me next week on Tuesday. Rick Gilmore, Rick Gilmore ([id number]) Electronic Signature(s) Signed: 04/28/2015 12:55:50 PM By: [name] MD, FACS Entered By: [name] on 04/28/2015 11:57:09 Rick Gilmore ([id number]) -------------------------------------------------------------------------------- [facility] Details Patient Name: Rick Gilmore Date of Service: 04/28/2015 Medical Record Number: [id number] Patient Account Number: [id number] Date of Birth/Sex: 1941/07/05 (74 y.o. Male) Treating RN: Primary Care Physician: Deborra Medina Other Clinician: Referring Physician: Deborra Medina Treating Physician/Extender: [name] in Treatment: 6 Diagnosis Coding ICD-10  Codes Code Description [phone] Non-pressure chronic ulcer of left calf with fat layer exposed S81.812A Laceration without foreign body, left lower leg, initial encounter Facility Procedures CPT4 Code: [id number] Description: 99213 - WOUND CARE VISIT-LEV 3 EST PT Modifier: Quantity: 1 Physician Procedures CPT4 Code Description: [id number] - WC PHYS LEVEL 3 - EST PT ICD-10 Description Diagnosis L97.222 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

## 2015-05-02 ENCOUNTER — Encounter: Payer: Self-pay | Admitting: Family

## 2015-05-02 ENCOUNTER — Ambulatory Visit: Payer: Medicare Other | Attending: Family | Admitting: Family

## 2015-05-02 VITALS — BP 93/41 | HR 76 | Resp 20 | Ht 75.0 in | Wt 252.0 lb

## 2015-05-02 DIAGNOSIS — I5020 Unspecified systolic (congestive) heart failure: Secondary | ICD-10-CM | POA: Diagnosis not present

## 2015-05-02 DIAGNOSIS — J449 Chronic obstructive pulmonary disease, unspecified: Secondary | ICD-10-CM | POA: Insufficient documentation

## 2015-05-02 DIAGNOSIS — I959 Hypotension, unspecified: Secondary | ICD-10-CM | POA: Insufficient documentation

## 2015-05-02 DIAGNOSIS — I5022 Chronic systolic (congestive) heart failure: Secondary | ICD-10-CM

## 2015-05-02 NOTE — Patient Instructions (Signed)
Continue to weigh daily and call for an overnight weight gain of >2 pounds or a weekly weight gain of >5 pounds.

## 2015-05-02 NOTE — Progress Notes (Signed)
   Subjective:    Patient ID: Rick Gilmore, male    DOB: 1940/12/13, 74 y.o.   MRN: [id number]  Congestive Heart Failure Presents for follow-up visit. The disease course has been stable. Associated symptoms include fatigue (miild ). Pertinent negatives include no chest pain, edema, orthopnea, palpitations, paroxysmal nocturnal dyspnea or shortness of breath. The symptoms have been stable. Past treatments include salt and fluid restriction, ACE inhibitors, beta blockers and exercise. The treatment provided significant relief. Compliance with prior treatments has been good. His past medical history is significant for anemia and chronic lung disease.      Review of Systems  Constitutional: Positive for fatigue (miild ). Negative for appetite change.  HENT: Positive for trouble swallowing. Negative for congestion and sore throat.   Eyes: Negative.   Respiratory: Negative for cough, shortness of breath and wheezing.   Cardiovascular: Negative for chest pain, palpitations and leg swelling.  Gastrointestinal: Negative.   Endocrine: Negative.   Genitourinary: Negative.   Musculoskeletal: Negative for back pain and neck pain.  Skin: Positive for wound (left lateral calf). Negative for rash.  Allergic/Immunologic: Negative.   Neurological: Negative for dizziness and headaches.  Hematological: Negative for adenopathy. Bruises/bleeds easily.  Psychiatric/Behavioral: Negative for sleep disturbance and agitation.       Objective:   Physical Exam  Constitutional: He is oriented to person, place, and time. He appears well-developed and well-nourished.  HENT:  Head: Normocephalic and atraumatic.  Eyes: Conjunctivae are normal. Pupils are equal, round, and reactive to light.  Neck: Normal range of motion. Neck supple.  Cardiovascular: Normal rate and regular rhythm.   Pulmonary/Chest: Effort normal and breath sounds normal. He has no rales. He exhibits no tenderness.  Abdominal: Soft. He exhibits  no distension. There is no tenderness.  Musculoskeletal: He exhibits edema (trace amount bilateral lower legs). He exhibits no tenderness.  Neurological: He is oriented to person, place, and time.  Skin: Skin is warm and dry. Rash (healing wound on left lateral lower leg) noted.  Psychiatric: He has a normal mood and affect. His behavior is normal. Thought content normal.  Nursing note and vitals reviewed.         Assessment & Plan:  1: Chronic heart failure with reduced ejection fraction- Patient presents without any shortness of breath. Does endorse a mild amount of fatigue with exertion but says that it resolves quickly. Continues to weigh himself daily and brought his weight chart for review. Has gradually gained some weight but says that it's because he's been eating more. Reminded him to call for an overnight weight gain of >2 pounds or a weekly weight gain of >5 pounds. He says that he rarely adds salt to anything and mostly uses Mrs. Dash seasonings. Takes between 40-80mg  furosemide daily. Overall says that he feels the best he's felt in 15 years. 2: Hypotension- Blood pressure remains on the low side but patient is currently without any dizziness. Continue medications at this time. 3: COPD- Currently denies any shortness of breath. Continues with his inhalers.  Return in 6 months or sooner for any questions/problems before the next office visit.

## 2015-05-06 ENCOUNTER — Encounter: Payer: Medicare Other | Admitting: Surgery

## 2015-05-06 DIAGNOSIS — R600 Localized edema: Secondary | ICD-10-CM | POA: Diagnosis not present

## 2015-05-06 DIAGNOSIS — J449 Chronic obstructive pulmonary disease, unspecified: Secondary | ICD-10-CM | POA: Diagnosis not present

## 2015-05-06 DIAGNOSIS — S81812A Laceration without foreign body, left lower leg, initial encounter: Secondary | ICD-10-CM | POA: Diagnosis not present

## 2015-05-06 DIAGNOSIS — L97222 Non-pressure chronic ulcer of left calf with fat layer exposed: Secondary | ICD-10-CM | POA: Diagnosis not present

## 2015-05-06 DIAGNOSIS — I2510 Atherosclerotic heart disease of native coronary artery without angina pectoris: Secondary | ICD-10-CM | POA: Diagnosis not present

## 2015-05-06 DIAGNOSIS — Z86010 Personal history of colonic polyps: Secondary | ICD-10-CM | POA: Diagnosis not present

## 2015-05-06 DIAGNOSIS — I10 Essential (primary) hypertension: Secondary | ICD-10-CM | POA: Diagnosis not present

## 2015-05-06 DIAGNOSIS — R195 Other fecal abnormalities: Secondary | ICD-10-CM | POA: Diagnosis not present

## 2015-05-06 DIAGNOSIS — D649 Anemia, unspecified: Secondary | ICD-10-CM | POA: Diagnosis not present

## 2015-05-06 NOTE — Progress Notes (Signed)
[facility]  [phone] [phone]) [phone]  ID: Rick Gilmore OB: Mar 23, 1941  MR#: [id number]  [id number]  Patient Care Team: Crecencio Mc, MD as PCP - General (Internal Medicine) Manya Silvas, MD (Gastroenterology) Corey Skains, MD as Consulting Physician (Cardiology)  CHIEF COMPLAINT:  Chief Complaint  Patient presents with  . Follow-up    anemia/thrombocytopenia    INTERVAL HISTORY: Patient returns to clinic today for repeat laboratory work and further evaluation. He currently feels well and is asymptomatic. He does not complain of weakness and fatigue today.  He has a good appetite and denies weight loss.  He has no neurologic complaints.  He denies any chest pain or shortness of breath.  He denies any nausea, vomiting, constipation, or diarrhea.   He has no melena or hematochezia.  He has no urinary complaints.  Patient offers no specific complaints today.  REVIEW OF SYSTEMS:   Review of Systems  Constitutional: Negative.   Respiratory: Negative.   Cardiovascular: Negative.     As per HPI. Otherwise, a complete review of systems is negatve.  PAST MEDICAL HISTORY: Past Medical History  Diagnosis Date  . CAD (coronary artery disease)   . Atrial fibrillation     on Coumadin  . Hypertension   . Erectile dysfunction   . Hyperlipidemia   . Ischemic cardiomyopathy     s/p AICD/pacer 2009, replaced 2010 [facility]  . Cancer 2001    Prostate, XRT and implant  . Tubular adenoma     colon polyp  . Personal history of prostate cancer 2001    s/p XRT and implant ([facility])  . Sleep apnea, obstructive     uses CPAP nightly  . Anemia   . Thrombocytopenia   . Prostate cancer   . CHF (congestive heart failure)   . MI (myocardial infarction) 1988  . Arrhythmia     a fib  . COPD (chronic obstructive pulmonary disease)   . Vitamin B 12 deficiency     PAST SURGICAL HISTORY: Past Surgical History  Procedure Laterality Date  . Vasectomy     . Coronary artery bypass graft  1988  . Implantable cardioverter defibrillator generator change  April 2014    Bi V RRR  . Insert / replace / remove pacemaker  2014    FAMILY HISTORY Family History  Problem Relation Age of Onset  . Hypertension Mother   . Hypertension Father   . Cancer Father     prostate, throat  . Emphysema Father   . Heart disease Maternal Grandmother   . Heart disease Paternal Grandmother        ADVANCED DIRECTIVES:    HEALTH MAINTENANCE: History  Substance Use Topics  . Smoking status: Never Smoker   . Smokeless tobacco: Never Used  . Alcohol Use: 1.0 oz/week    2 Standard drinks or equivalent per week     Colonoscopy:  PAP:  Bone density:  Lipid panel:  Allergies  Allergen Reactions  . No Known Allergies     Current Outpatient Prescriptions  Medication Sig Dispense Refill  . allopurinol (ZYLOPRIM) 300 MG tablet Take 1 tablet (300 mg total) by mouth daily. 90 tablet 3  . cyanocobalamin (,VITAMIN B-12,) 1000 MCG/ML injection Inject 1,000 mcg into the muscle every 30 (thirty) days.    Marland Kitchen ELIQUIS 5 MG TABS tablet TAKE 1 TABLET TWICE A DAY 180 tablet 1  . escitalopram (LEXAPRO) 10 MG tablet TAKE 1 TABLET DAILY 90 tablet 0  . HYDROcodone-acetaminophen (  NORCO/VICODIN) 5-325 MG per tablet Take 1-2 tablets by mouth every 4 (four) hours as needed for moderate pain.    . isosorbide mononitrate (IMDUR) 30 MG 24 hr tablet Take 30 mg by mouth daily.  11  . lisinopril (PRINIVIL,ZESTRIL) 5 MG tablet Take 2.5 mg by mouth 2 (two) times daily.    . potassium chloride SA (K-DUR,KLOR-CON) 20 MEQ tablet Take 1 tablet (20 mEq total) by mouth daily. 90 tablet 2  . spironolactone (ALDACTONE) 25 MG tablet Take 1 tablet (25 mg total) by mouth daily. 90 tablet 2  . albuterol (PROVENTIL HFA;VENTOLIN HFA) 108 (90 BASE) MCG/ACT inhaler Inhale 2 puffs into the lungs every 6 (six) hours as needed for wheezing or shortness of breath. (Patient not taking: Reported on  05/02/2015) 8 g 3  . colchicine 0.6 MG tablet Take 0.6 mg by mouth daily.    . furosemide (LASIX) 80 MG tablet TAKE 1 TABLET DAILY 90 tablet 1  . metoprolol succinate (TOPROL-XL) 50 MG 24 hr tablet Take 50 mg by mouth daily. Take with or immediately following a meal.     No current facility-administered medications for this visit.    OBJECTIVE: Filed Vitals:   04/16/15 1506  BP: 90/50  Pulse: 84  Resp: 18     Body mass index is 31.33 kg/(m^2).    ECOG FS:0 - Asymptomatic  General: Well-developed, well-nourished, no acute distress. Eyes: anicteric sclera. Lungs: Clear to auscultation bilaterally. Heart: Regular rate and rhythm. No rubs, murmurs, or gallops. Abdomen: Soft, nontender, nondistended. No organomegaly noted, normoactive bowel sounds. Musculoskeletal: No edema, cyanosis, or clubbing. Neuro: Alert, answering all questions appropriately. Cranial nerves grossly intact. Skin: No rashes or petechiae noted. Psych: Normal affect.    LAB RESULTS:  Lab Results  Component Value Date   NA 134* 04/24/2015   K 4.6 04/24/2015   CL 102 04/24/2015   CO2 27 04/24/2015   GLUCOSE 76 04/24/2015   BUN 32* 04/24/2015   CREATININE 1.15 04/24/2015   CALCIUM 9.4 04/24/2015   PROT 7.0 03/10/2015   ALBUMIN 4.1 03/10/2015   AST 22 03/10/2015   ALT 12* 03/10/2015   ALKPHOS 54 03/10/2015   BILITOT 0.6 01/22/2015   GFRNONAA >60 03/10/2015   GFRAA >60 03/10/2015    Lab Results  Component Value Date   WBC 7.2 04/16/2015   NEUTROABS 4.6 04/16/2015   HGB 11.9* 04/16/2015   HCT 36.5* 04/16/2015   MCV 100.0 04/16/2015   PLT 124* 04/16/2015     STUDIES: No results found.  ASSESSMENT: Iron deficiency anemia, thrombocytopenia.  PLAN:    1.  Iron deficiency anemia: Patient's hemoglobin and iron stores are essentially within normal limits today. He does not require additional IV Feraheme today. Patient last received 510 mg IV Feraheme in July 2015. The remainder of his anemia workup  was negative including SIEP and hemolysis labs. Patient will return to clinic in 4 months for repeat laboratory work and further evaluation. If his iron stores remain within normal limits at that time, he likely can be discharged from clinic. He expressed understanding and was in agreement with this plan. 2.  Thrombocytopenia: Mild. Unchanged for over a year, monitor.   Patient expressed understanding and was in agreement with this plan. He also understands that He can call clinic at any time with any questions, concerns, or complaints.   No matching staging information was found for the patient.  Lloyd Huger, MD   05/06/2015 9:16 AM

## 2015-05-06 NOTE — Progress Notes (Signed)
CHOICE, MOUDRY ([id number]) Visit Report for 05/06/2015 Chief Complaint Document Details Patient Name: Rick Gilmore, Rick Gilmore Date of Service: 05/06/2015 1:00 PM Medical Record Number: [id number] Patient Account Number: [id number] Date of Birth/Sex: 1941-02-07 (74 y.o. Male) Treating RN: Primary Care Physician: Deborra Medina Other Clinician: Referring Physician: Deborra Medina Treating Physician/Extender: [name] in Treatment: 7 Information Obtained from: Patient Chief Complaint Patient presents to the wound care center for a consult due non healing wound 74 year old gentleman comes with a history of having a injury to his left lower extremity on 03/04/15,which was seen in the ER about 10 days ago. Electronic Signature(s) Signed: 05/06/2015 3:59:11 PM By: [name] MD, FACS Entered By: [name] on 05/06/2015 13:35:11 Rick Gilmore ([id number]) -------------------------------------------------------------------------------- Debridement Details Patient Name: Rick Gilmore Date of Service: 05/06/2015 1:00 PM Medical Record Number: [id number] Patient Account Number: [id number] Date of Birth/Sex: [birth year] (74 y.o. Male) Treating RN: Primary Care Physician: Deborra Medina Other Clinician: Referring Physician: Deborra Medina Treating Physician/Extender: [name] in Treatment: 7 Debridement Performed for Wound #1 Left Lower Leg Assessment: Performed By: Physician [name], MD Debridement: Open Wound/Selective Debridement Selective Description: Pre-procedure Yes Verification/Time Out Taken: Start Time: 13:30 Pain Control: Lidocaine 4% Topical Solution Total Area Debrided (L x 1.9 (cm) x 1 (cm) = 1.9 (cm) W): Tissue and other Non-Viable, Exudate, Fibrin/Slough material debrided: Bleeding: Minimum Hemostasis Achieved: Pressure End Time: 13:32 Procedural Pain: 2 Post Procedural Pain: 0 Response to Treatment: Procedure was tolerated well Post  Debridement Measurements of Total Wound Length: (cm) 1.9 Width: (cm) 17 Depth: (cm) 0.9 Volume: (cm) 22.832 Electronic Signature(s) Signed: 05/06/2015 3:59:11 PM By: [name] MD, FACS Entered By: [name] on 05/06/2015 13:35:04 Rick Gilmore ([id number]) -------------------------------------------------------------------------------- HPI Details Patient Name: Rick Gilmore Date of Service: 05/06/2015 1:00 PM Medical Record Number: [id number] Patient Account Number: [id number] Date of Birth/Sex: 07/05/1941 (74 y.o. Male) Treating RN: Primary Care Physician: Deborra Medina Other Clinician: Referring Physician: Deborra Medina Treating Physician/Extender: [name] in Treatment: 7 History of Present Illness Location: left lower extremity Quality: Patient reports experiencing a dull pain to affected area(s). Severity: Patient states wound (s) are getting better. Duration: Patient has had the wound for < 2 weeks prior to presenting for treatment Timing: Pain in wound is Intermittent (comes and goes Context: The wound occurred when the patient after he had a fall and hurt himself. Modifying Factors: Patient must stand for long periods while working Associated Signs and Symptoms: Patient reports presence of swelling HPI Description: 74 year old gentleman who has a past medical history significant for gout, edema of the lower limbs, anemia, hypertension, acid reflux, depression, COPD, coronary artery disease and history of sleep apnea. He has had a bypass surgery in 1988 and has had prostate cancer with radiation and seed implants in 2002. He is also known to have a pacemaker and defibrillator placed in 2014. he has been seen in the ER on 3 different occasions since the fall and they have been applying a dry dressing and some labs were done the last time around. No x-rays or ultrasound has been done. The patient is on an anticoagulant and this has not been held at any  stage. During his ER notes from 03/10/2015 is noted that he had a wound on the left lower extremity which she's had for about 10 days now. The patient was noted to have extensive ecchymosis and tenderness and a laceration to the left lower extremity. Note is made of the fact that he had no signs  of cellulitis and his labs were normal. He was given symptomatic treatment and asked to follow-up in the wound center. 03/24/2015 -- he has been doing fine and his dressing has been done by his family members and is at no issues. His treatment the blood thinner continuous. He will be going out of town this week and will be able to see as next week on Tuesday. Electronic Signature(s) Signed: 05/06/2015 3:59:11 PM By: [name] MD, FACS Entered By: [name] on 05/06/2015 13:35:18 JOHNNELL, MICHEAU ([id number]) -------------------------------------------------------------------------------- Physical Exam Details Patient Name: Rick Gilmore Date of Service: 05/06/2015 1:00 PM Medical Record Number: [id number] Patient Account Number: [id number] Date of Birth/Sex: 1941-07-06 (74 y.o. Male) Treating RN: Primary Care Physician: Deborra Medina Other Clinician: Referring Physician: Deborra Medina Treating Physician/Extender: [name] in Treatment: 7 Constitutional . Pulse regular. Respirations normal and unlabored. Afebrile. . Eyes Nonicteric. Reactive to light. Ears, Nose, Mouth, and Throat Lips, teeth, and gums WNL.Marland Kitchen Moist mucosa without lesions . Neck supple and nontender. No palpable supraclavicular or cervical adenopathy. Normal sized without goiter. Respiratory WNL. No retractions.. Cardiovascular Pedal Pulses WNL. No clubbing, cyanosis or edema. Integumentary (Hair, Skin) the superior part of the wound is healing nicely. He still has some undermining between the 6 and 8:00 position.. No crepitus or fluctuance. No peri-wound warmth or erythema. No masses.Marland Kitchen Psychiatric Judgement  and insight Intact.. No evidence of depression, anxiety, or agitation.. Electronic Signature(s) Signed: 05/06/2015 3:59:11 PM By: [name] MD, FACS Entered By: [name] on 05/06/2015 13:36:11 SEKOU, ATTEBERY ([id number]) -------------------------------------------------------------------------------- Physician Orders Details Patient Name: Rick Gilmore Date of Service: 05/06/2015 1:00 PM Medical Record Number: [id number] Patient Account Number: [id number] Date of Birth/Sex: [birth year] (74 y.o. Male) Treating RN: [name] Primary Care Physician: Deborra Medina Other Clinician: Referring Physician: Deborra Medina Treating Physician/Extender: [name] in Treatment: 7 Verbal / Phone Orders: Yes Clinician: [name] Read Back and Verified: Yes Diagnosis Coding Wound Cleansing Wound #1 Left Lower Leg o Clean wound with Normal Saline. Anesthetic Wound #1 Left Lower Leg o Topical Lidocaine 4% cream applied to wound bed prior to debridement Primary Wound Dressing Wound #1 Left Lower Leg o Iodosorb Ointment - on packing strip into undermined area o Plain packing gauze Secondary Dressing Wound #1 Left Lower Leg o Boardered Foam Dressing Dressing Change Frequency Wound #1 Left Lower Leg o Change dressing every other day. Follow-up Appointments Wound #1 Left Lower Leg o Return Appointment in 1 week. Additional Orders / Instructions Wound #1 Left Lower Leg o Increase protein intake. o Activity as tolerated Electronic Signature(s) Signed: 05/06/2015 2:10:43 PM By: [name] RN Signed: 05/06/2015 3:59:11 PM By: [name] MD, FACS [address] [place] ([id number]) Entered By: [name] on 05/06/2015 13:30:40 NASARIO, MASEK ([id number]) -------------------------------------------------------------------------------- Problem List Details Patient Name: Rick Gilmore Date of Service: 05/06/2015 1:00 PM Medical Record Number:  [id number] Patient Account Number: [id number] Date of Birth/Sex: Apr 20, 1941 (74 y.o. 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 Male) Treating RN: Primary Care Physician: Deborra Medina Other Clinician: Referring Physician: Deborra Medina Treating Physician/Extender: [name] in Treatment: 7 Subjective Chief Complaint Information obtained from Patient Patient presents to the wound care center for a consult due non healing wound 74 year old gentleman comes with a history of having a injury to his left lower extremity on 03/04/15,which was seen in the ER about 10 days ago. History of Present Illness (HPI) The following HPI elements were documented for the patient's wound: Location: left lower extremity Quality: Patient reports experiencing a dull pain to affected area(s). Severity: Patient states wound (s) are getting better. Duration: Patient has had the wound for < 2 weeks prior to presenting for treatment Timing: Pain in wound is Intermittent (comes and goes Context: The wound occurred when the patient after he had a fall and hurt  himself. Modifying Factors: Patient must stand for long periods while working Associated Signs and Symptoms: Patient reports presence of swelling 74 year old gentleman who has a past medical history significant for gout, edema of the lower limbs, anemia, hypertension, acid reflux, depression, COPD, coronary artery disease and history of sleep apnea. He has had a bypass surgery in 1988 and has had prostate cancer with radiation and seed implants in 2002. He is also known to have a pacemaker and defibrillator placed in 2014. he has been seen in the ER on 3 different occasions since the fall and they have been applying a dry dressing and some labs were done the last time around. No x-rays or ultrasound has been done. The patient is on an anticoagulant and this has not been held at any stage. During his ER notes from 03/10/2015 is noted that he had a wound on the left lower extremity which she's had for about 10 days now. The patient was noted to have extensive ecchymosis and tenderness and a laceration to the left lower extremity. Note is made of the fact that he had no signs of cellulitis and his labs were normal. He was given symptomatic treatment and asked to follow-up in the wound center. 03/24/2015 -- he has been doing fine and his dressing has been done by his family members and is at no issues. His treatment the blood thinner continuous. He will be going out of town this week and will be able to see as next week on Tuesday. Hsiao, Grayling ([id number]) Objective Constitutional Pulse regular. Respirations normal and unlabored. Afebrile. Vitals Time Taken: 1:24 PM, Height: 75 in, Weight: 252 lbs, BMI: 31.5, Temperature: 98.5 F, Pulse: 60 bpm, Respiratory Rate: 17 breaths/min. Eyes Nonicteric. Reactive to light. Ears, Nose, Mouth, and Throat Lips, teeth, and gums WNL.Marland Kitchen Moist mucosa without lesions . Neck supple and nontender. No palpable supraclavicular or cervical adenopathy. Normal  sized without goiter. Respiratory WNL. No retractions.. Cardiovascular Pedal Pulses WNL. No clubbing, cyanosis or edema. Psychiatric Judgement and insight Intact.. No evidence of depression, anxiety, or agitation.. Integumentary (Hair, Skin) the superior part of the wound is healing nicely. He still has some undermining between the 6 and 8:00 position.. No crepitus or fluctuance. No peri-wound warmth or erythema. No masses.. Wound #1 status is Open. Original cause of wound was Trauma. The wound is located on the Left Lower Leg. The wound measures 1.9cm length x 1cm width x 0.9cm depth; 1.492cm^2 area and 1.343cm^3 volume. The wound is limited to skin breakdown. There is no undermining noted, however, there is tunneling at 6:00 with a maximum distance  of 1cm. There is a medium amount of serosanguineous drainage noted. The wound margin is distinct with the outline attached to the wound base. There is large (67-100%) red granulation within the wound bed. There is a small (1-33%) amount of necrotic tissue within the wound bed including Eschar. The periwound skin appearance had no abnormalities noted for moisture. The periwound skin appearance exhibited: Induration, Localized Edema, Hemosiderin Staining. The periwound skin appearance did not exhibit: Callus, Crepitus, Excoriation, Fluctuance, Friable, Rash, Scarring, Erythema. Periwound temperature was noted as No Abnormality. Assessment DERWIN, POKORNEY ([id number]) Active Problems ICD-10 [phone] - Non-pressure chronic ulcer of left calf with fat layer exposed S81.812A - Laceration without foreign body, left lower leg, initial encounter Except for the undermining between the 6 and 8:00 position the rest of the wound is filled out nicely and he is getting good epithelization. We will continue packing with Iodosorb and a packing strip. He will come back and see as next week. Procedures Wound #1 Wound #1 is a Trauma, Other located on the Left  Lower Leg . There was an Open Wound debridement with total area of 1.9 sq cm performed by Stephaniemarie Stoffel, Jackson Latino., MD. to remove Non-Viable tissue/material including Exudate and Fibrin/Slough after achieving pain control using Lidocaine 4% Topical Solution. A time out was conducted prior to the start of the procedure. A Minimum amount of bleeding was controlled with Pressure. The procedure was tolerated well with a pain level of 2 throughout and a pain level of 0 following the procedure. Post Debridement Measurements: 1.9cm length x 17cm width x 0.9cm depth; 22.832cm^3 volume. Plan Wound Cleansing: Wound #1 Left Lower Leg: Clean wound with Normal Saline. Anesthetic: Wound #1 Left Lower Leg: Topical Lidocaine 4% cream applied to wound bed prior to debridement Primary Wound Dressing: Wound #1 Left Lower Leg: Iodosorb Ointment - on packing strip into undermined area Plain packing gauze Secondary Dressing: Wound #1 Left Lower Leg: Boardered Foam Dressing Dressing Change Frequency: Beechy, Kristofor ([id number]) Wound #1 Left Lower Leg: Change dressing every other day. Follow-up Appointments: Wound #1 Left Lower Leg: Return Appointment in 1 week. Additional Orders / Instructions: Wound #1 Left Lower Leg: Increase protein intake. Activity as tolerated Except for the undermining between the 6 and 8:00 position the rest of the wound is filled out nicely and he is getting good epithelization. We will continue packing with Iodosorb and a packing strip. He will come back and see as next week. Electronic Signature(s) Signed: 05/06/2015 3:59:11 PM By: [name] MD, FACS Entered By: [name] on 05/06/2015 13:37:58 Rick Gilmore ([id number]) -------------------------------------------------------------------------------- SuperBill Details Patient Name: Rick Gilmore Date of Service: 05/06/2015 Medical Record Number: [id number] Patient Account Number: [id number] Date of Birth/Sex: Nov 04, 1941  (74 y.o. Male) Treating RN: Primary Care Physician: Deborra Medina Other Clinician: Referring Physician: Deborra Medina Treating Physician/Extender: [name] in Treatment: 7 Diagnosis Coding ICD-10 Codes Code Description [id number] Non-pressure chronic ulcer of left calf with fat layer exposed S81.812A Laceration without foreign body, left lower leg, initial encounter Facility Procedures CPT4 Code Description: [id number] - DEBRIDE WOUND 1ST 20 SQ CM OR < ICD-10 Description Diagnosis L97.222 Non-pressure chronic ulcer of left calf with fat lay S81.812A Laceration without foreign body, left lower leg, ini Modifier: er exposed tial encounter Quantity: 1 Physician Procedures CPT4 Code Description: [id number] - WC PHYS DEBR WO ANESTH 20 SQ CM ICD-10 Description Diagnosis L97.222 Non-pressure chronic ulcer of left calf with fat laye S81.812A Laceration without foreign body, left lower leg, init Modifier:  r exposed ial encounter Quantity: 1 Electronic Signature(s) Signed: 05/06/2015 3:59:11 PM By: [name] MD, FACS Entered By: [name] on 05/06/2015 13:38:09

## 2015-05-08 NOTE — Progress Notes (Signed)
Rick Gilmore, Rick Gilmore ([id number]) Visit Report for 05/06/2015 Arrival Information Details Patient Name: Rick Gilmore, Rick Gilmore Date of Service: 05/06/2015 1:00 PM Medical Record Number: [id number] Patient Account Number: [id number] Date of Birth/Sex: [birth year] (74 y.o. Male) Treating RN: Baruch Gouty, RN, BSN, [place] Primary Care Physician: Deborra Medina Other Clinician: Referring Physician: Deborra Medina Treating Physician/Extender: [name] in Treatment: 7 Visit Information History Since Last Visit Any new allergies or adverse reactions: No Patient Arrived: Ambulatory Had a fall or experienced change in No Arrival Time: 13:23 activities of daily living that may affect Accompanied By: self risk of falls: Transfer Assistance: None Signs or symptoms of abuse/neglect since last No Patient Identification Verified: Yes visito Secondary Verification Process Yes Hospitalized since last visit: No Completed: Has Dressing in Place as Prescribed: Yes Patient Requires Transmission- No Pain Present Now: No Based Precautions: Patient Has Alerts: Yes Patient Alerts: Patient on Blood Thinner Electronic Signature(s) Signed: 05/07/2015 4:53:09 PM By: Regan Lemming BSN, RN Entered By: Regan Lemming on 05/06/2015 13:23:55 Rick Gilmore ([id number]) -------------------------------------------------------------------------------- Encounter Discharge Information Details Patient Name: Rick Gilmore Date of Service: 05/06/2015 1:00 PM Medical Record Number: [id number] Patient Account Number: [id number] Date of Birth/Sex: [birth year] (74 y.o. Male) Treating RN: Baruch Gouty, RN, BSN, [place] Primary Care Physician: Deborra Medina Other Clinician: Referring Physician: Deborra Medina Treating Physician/Extender: [name] in Treatment: 7 Encounter Discharge Information Items Discharge Pain Level: 0 Discharge Condition: Stable Ambulatory Status: Ambulatory Discharge Destination: Home Transportation: Private  Auto Accompanied By: self Schedule Follow-up Appointment: No Medication Reconciliation completed and provided to Patient/Care No Tangi Shroff: Provided on Clinical Summary of Care: 05/06/2015 Form Type Recipient Paper Patient [facility] Electronic Signature(s) Signed: 05/07/2015 4:53:09 PM By: Regan Lemming BSN, RN Previous Signature: 05/06/2015 1:39:07 PM Version By: Ruthine Dose Entered By: Regan Lemming on 05/06/2015 13:44:02 Rick Gilmore ([id number]) -------------------------------------------------------------------------------- Lower Extremity Assessment Details Patient Name: Rick Gilmore Date of Service: 05/06/2015 1:00 PM Medical Record Number: [id number] Patient Account Number: [id number] Date of Birth/Sex: Feb 01, 1941 (74 y.o. Male) Treating RN: Baruch Gouty, RN, BSN, [place] Primary Care Physician: Deborra Medina Other Clinician: Referring Physician: Deborra Medina Treating Physician/Extender: [name] in Treatment: 7 Edema Assessment Assessed: Shirlyn Goltz: No] [Right: No] E[Left: dema] [Right: :] Calf Left: Right: Point of Measurement: 33 cm From Medial Instep 40.5 cm cm Ankle Left: Right: Point of Measurement: 11 cm From Medial Instep 22.5 cm cm Vascular Assessment Claudication: Claudication Assessment [Left:None] Pulses: Posterior Tibial Dorsalis Pedis Palpable: [Left:Yes] Extremity colors, hair growth, and conditions: Extremity Color: [Left:Normal] Hair Growth on Extremity: [Left:Yes] Temperature of Extremity: [Left:Warm] Capillary Refill: [Left:< 3 seconds] Dependent Rubor: [Left:No] Blanched when Elevated: [Left:No] Lipodermatosclerosis: [Left:No] Toe Nail Assessment Left: Right: Thick: No Discolored: No Deformed: No Improper Length and Hygiene: No Rick Gilmore, Rick Gilmore ([id number]) Electronic Signature(s) Signed: 05/07/2015 4:53:09 PM By: Regan Lemming BSN, RN Entered By: Regan Lemming on 05/06/2015 13:26:21 Rick Gilmore, Rick Gilmore  ([id number]) -------------------------------------------------------------------------------- Multi Wound Chart Details Patient Name: Rick Gilmore Date of Service: 05/06/2015 1:00 PM Medical Record Number: [id number] Patient Account Number: [id number] Date of Birth/Sex: July 28, 1941 (74 y.o. Male) Treating RN: [name] Primary Care Physician: Deborra Medina Other Clinician: Referring Physician: Deborra Medina Treating Physician/Extender: [name] in Treatment: 7 Vital Signs Height(in): 75 Pulse(bpm): 60 Weight(lbs): 252 Blood Pressure (mmHg): Body Mass Index(BMI): 31 Temperature(F): 98.5 Respiratory Rate 17 (breaths/min): Photos: [1:No Photos] [[name]:[name]] Wound Location: [1:Left Lower Leg] [[name]:[name]] Wounding Event: [1:Trauma] [[name]:[name]] Primary Etiology: [1:Trauma, Other] [[name]:[name]] Comorbid History: [1:Anemia, Chronic Obstructive Pulmonary Disease (COPD), Sleep Apnea, Arrhythmia, Congestive Heart Failure, Coronary Artery Disease, Hypertension,  Myocardial Infarction, Gout, Received Radiation] [[name]:[name]] Date Acquired: [1:03/04/2015] [[name]:[name]] Weeks of Treatment: [1:7] [[name]:[name]] Wound Status: [1:Open] [[name]:[name]] Measurements L x W x D 1.9x1x0.9 [[name]:[name]] (cm) Area (cm) : [1:1.492] [[name]:[name]] Volume (cm) : [1:1.343] [[name]:[name]] % Reduction in Area: [1:-97.90%] [[name]:[name]] % Reduction in Volume: -789.40% [[name]:[name]] Position 1 (o'clock): 6 Maximum Distance 1 1 (cm): Tunneling: [1:Yes] [[name]:[name]] Classification: [1:Full Thickness Without Exposed Support Structures] [[name]:[name]] Exudate Amount: [1:Medium] [[name]:[name]] Exudate Type: Serosanguineous [name] [name] Exudate Color: red, brown [name] [name] Wound Margin: Distinct, outline attached [name] [name] Granulation Amount: Large (67-100%) [name] [name] Granulation Quality: Red [name] [name] Necrotic Amount: Small (1-33%) [name] [name] Necrotic Tissue: Eschar [name] [name] Exposed Structures: Fascia: No [name] [name] Fat: No Tendon: No Muscle: No Joint: No Bone:  No Limited to Skin Breakdown Epithelialization: Small (1-33%) [name] [name] Periwound Skin Texture: Edema: Yes [name] [name] Induration: Yes Excoriation: No Callus: No Crepitus: No Fluctuance: No Friable: No Rash: No Scarring: No Periwound Skin No Abnormalities Noted [name] [name] Moisture: Periwound Skin Color: Hemosiderin Staining: Yes [name] [name] Erythema: No Temperature: No Abnormality [name] [name] Tenderness on No [name] [name] Palpation: Wound Preparation: Ulcer Cleansing: [name] [name] Rinsed/Irrigated with Saline Topical Anesthetic Applied: Other: 4% lidocaine cream Treatment Notes Electronic Signature(s) Signed: 05/06/2015 2:10:43 PM By: [name] RN Entered By: [name] on 05/06/2015 13:28:18 Rick Gilmore ([id number]) -------------------------------------------------------------------------------- Multi-Disciplinary Care Plan Details Patient Name: Rick Gilmore Date of Service: 05/06/2015 1:00 PM Medical Record Number: [id number] Patient Account Number: [id number] Date of Birth/Sex: 16-Sep-1941 (74 y.o. Male) Treating RN: [name] Primary Care Physician: Deborra Medina Other Clinician: Referring Physician: Deborra Medina Treating Physician/Extender: [name] in Treatment: 7 Active Inactive Abuse / Safety / Falls / Self Care Management Nursing Diagnoses: Potential for falls Goals: Patient will remain injury free Date Initiated: 03/17/2015 Goal Status: Active Interventions: Assess fall risk on admission and as needed Notes: Necrotic Tissue Nursing Diagnoses: Impaired tissue integrity related to necrotic/devitalized tissue Goals: Necrotic/devitalized tissue will be minimized in the wound bed Date Initiated: 03/17/2015 Goal Status: Active Interventions: Assess patient pain level pre-, during and post procedure and prior to discharge Treatment Activities: Apply topical anesthetic as ordered : 05/06/2015 Notes: Orientation to the Wound Care Program Nursing  Diagnoses: Knowledge deficit related to the wound healing center program Rick Gilmore, Rick Gilmore ([id number]) Goals: Patient/caregiver will verbalize understanding of the [place] Program Date Initiated: 03/17/2015 Goal Status: Active Interventions: Provide education on orientation to the wound center Notes: Wound/Skin Impairment Nursing Diagnoses: Impaired tissue integrity Goals: Patient/caregiver will verbalize understanding of skin care regimen Date Initiated: 03/17/2015 Goal Status: Active Ulcer/skin breakdown will heal within 14 weeks Date Initiated: 03/17/2015 Goal Status: Active Interventions: Assess patient/caregiver ability to obtain necessary supplies Notes: Electronic Signature(s) Signed: 05/06/2015 2:10:43 PM By: [name] RN Entered By: [name] on 05/06/2015 13:28:11 Rick Gilmore ([id number]) -------------------------------------------------------------------------------- Pain Assessment Details Patient Name: Rick Gilmore Date of Service: 05/06/2015 1:00 PM Medical Record Number: [id number] Patient Account Number: [id number] Date of Birth/Sex: 1940-12-27 (74 y.o. Male) Treating RN: Baruch Gouty, RN, BSN, [place] Primary Care Physician: Deborra Medina Other Clinician: Referring Physician: Deborra Medina Treating Physician/Extender: [name] in Treatment: 7 Active Problems Location of Pain Severity and Description of Pain Patient Has Paino No Site Locations Pain Management and Medication Current Pain Management: Electronic Signature(s) Signed: 05/07/2015 4:53:09 PM By: Regan Lemming BSN, RN Entered By: Regan Lemming on 05/06/2015 13:24:01 Rick Gilmore ([id number]) -------------------------------------------------------------------------------- Patient/Caregiver Education Details Patient Name: Rick Gilmore Date of Service: 05/06/2015 1:00 PM Medical Record Number: [id number]  Patient Account Number: [id number] Date of Birth/Gender: 1941/03/10 (74 y.o.  Male) Treating RN: Baruch Gouty, RN, BSN, [place] Primary Care Physician: Deborra Medina Other Clinician: Referring Physician: Deborra Medina Treating Physician/Extender: [name] in Treatment: 7 Education Assessment Education Provided To: Patient Education Topics Provided Wound/Skin Impairment: Methods: Explain/Verbal Responses: State content correctly Electronic Signature(s) Signed: 05/07/2015 4:53:09 PM By: Regan Lemming BSN, RN Entered By: Regan Lemming on 05/06/2015 13:44:12 Rick Gilmore ([id number]) -------------------------------------------------------------------------------- Wound Assessment Details Patient Name: Rick Gilmore Date of Service: 05/06/2015 1:00 PM Medical Record Number: [id number] Patient Account Number: [id number] Date of Birth/Sex: 1941-07-10 (74 y.o. Male) Treating RN: Baruch Gouty, RN, BSN, [place] Primary Care Physician: Deborra Medina Other Clinician: Referring Physician: Deborra Medina Treating Physician/Extender: [name] in Treatment: 7 Wound Status Wound Number: 1 Primary Trauma, Other Etiology: Wound Location: Left Lower Leg Wound Open Wounding Event: Trauma Status: Date Acquired: 03/04/2015 Comorbid Anemia, Chronic Obstructive Pulmonary Weeks Of Treatment: 7 History: Disease (COPD), Sleep Apnea, Clustered Wound: No Arrhythmia, Congestive Heart Failure, Coronary Artery Disease, Hypertension, Myocardial Infarction, Gout, Received Radiation Photos Photo Uploaded By: Regan Lemming on 05/06/2015 16:24:36 Wound Measurements Length: (cm) 1.9 Width: (cm) 1 Depth: (cm) 0.9 Area: (cm) 1.492 Volume: (cm) 1.343 % Reduction in Area: -97.9% % Reduction in Volume: -789.4% Epithelialization: Small (1-33%) Tunneling: Yes Position (o'clock): 6 Maximum Distance: (cm) 1 Undermining: No Wound Description Full Thickness Without Exposed Classification: Support Structures Wound Margin: Distinct, outline attached Exudate Medium Amount: Rick Gilmore,  Rick Gilmore ([id number]) Foul Odor After Cleansing: No Exudate Type: Serosanguineous Exudate Color: red, brown Wound Bed Granulation Amount: Large (67-100%) Exposed Structure Granulation Quality: Red Fascia Exposed: No Necrotic Amount: Small (1-33%) Fat Layer Exposed: No Necrotic Quality: Eschar Tendon Exposed: No Muscle Exposed: No Joint Exposed: No Bone Exposed: No Limited to Skin Breakdown Periwound Skin Texture Texture Color No Abnormalities Noted: No No Abnormalities Noted: No Callus: No Erythema: No Crepitus: No Hemosiderin Staining: Yes Excoriation: No Temperature / Pain Fluctuance: No Temperature: No Abnormality Friable: No Induration: Yes Localized Edema: Yes Rash: No Scarring: No Moisture No Abnormalities Noted: Yes Wound Preparation Ulcer Cleansing: Rinsed/Irrigated with Saline Topical Anesthetic Applied: Other: 4% lidocaine cream, Treatment Notes Wound #1 (Left Lower Leg) 1. Cleansed with: Clean wound with Normal Saline 2. Anesthetic Topical Lidocaine 4% cream to wound bed prior to debridement 4. Dressing Applied: Iodoform packing Gauze 5. Secondary Dressing Applied Bordered Foam Dressing Electronic Signature(s) Signed: 05/07/2015 4:53:09 PM By: Regan Lemming BSN, RN Entered By: Regan Lemming on 05/06/2015 13:23:36 Rick Gilmore, Rick Gilmore ([id number]) Rick Gilmore, Rick Gilmore ([id number]) -------------------------------------------------------------------------------- [place] Details Patient Name: Rick Gilmore Date of Service: 05/06/2015 1:00 PM Medical Record Number: [id number] Patient Account Number: [id number] Date of Birth/Sex: Nov 05, 1941 (74 y.o. Male) Treating RN: [place], RN, BSN, [place] Primary Care Physician: Deborra Medina Other Clinician: Referring Physician: Deborra Medina Treating Physician/Extender: [name] in Treatment: 7 Vital Signs Time Taken: 13:24 Temperature (F): 98.5 Height (in): 75 Pulse (bpm): 60 Weight (lbs): 252 Respiratory Rate  (breaths/min): 17 Body Mass Index (BMI): 31.5 Reference Range: 80 - 120 mg / dl Electronic Signature(s) Signed: 05/07/2015 4:53:09 PM By: Regan Lemming BSN, RN Entered By: Regan Lemming on 05/06/2015 13:26:59

## 2015-05-12 ENCOUNTER — Encounter: Payer: Medicare Other | Attending: Surgery | Admitting: Surgery

## 2015-05-12 DIAGNOSIS — I10 Essential (primary) hypertension: Secondary | ICD-10-CM | POA: Insufficient documentation

## 2015-05-12 DIAGNOSIS — I2510 Atherosclerotic heart disease of native coronary artery without angina pectoris: Secondary | ICD-10-CM | POA: Insufficient documentation

## 2015-05-12 DIAGNOSIS — S81812A Laceration without foreign body, left lower leg, initial encounter: Secondary | ICD-10-CM | POA: Insufficient documentation

## 2015-05-12 DIAGNOSIS — J449 Chronic obstructive pulmonary disease, unspecified: Secondary | ICD-10-CM | POA: Diagnosis not present

## 2015-05-12 DIAGNOSIS — L97222 Non-pressure chronic ulcer of left calf with fat layer exposed: Secondary | ICD-10-CM | POA: Diagnosis not present

## 2015-05-12 DIAGNOSIS — G4730 Sleep apnea, unspecified: Secondary | ICD-10-CM | POA: Diagnosis not present

## 2015-05-12 DIAGNOSIS — R600 Localized edema: Secondary | ICD-10-CM | POA: Diagnosis not present

## 2015-05-12 DIAGNOSIS — S81812D Laceration without foreign body, left lower leg, subsequent encounter: Secondary | ICD-10-CM | POA: Diagnosis not present

## 2015-05-13 NOTE — Progress Notes (Signed)
COLLIN, CLARA ([id number]) Visit Report for 05/12/2015 Arrival Information Details Patient Name: Rick Gilmore, Rick Gilmore Date of Service: 05/12/2015 8:45 AM Medical Record Number: [id number] Patient Account Number: [id number] Date of Birth/Sex: 1940/12/30 (74 y.o. Male) Treating RN: Cornell Barman Primary Care Physician: Deborra Medina Other Clinician: Referring Physician: Deborra Medina Treating Physician/Extender: [name] in Treatment: 8 Visit Information History Since Last Visit Added or deleted any medications: No Patient Arrived: Ambulatory Any new allergies or adverse reactions: No Arrival Time: 09:13 Had a fall or experienced change in No Accompanied By: self activities of daily living that may affect Transfer Assistance: None risk of falls: Patient Identification Verified: Yes Signs or symptoms of abuse/neglect since last No Secondary Verification Process Yes visito Completed: Has Dressing in Place as Prescribed: Yes Patient Requires Transmission- No Pain Present Now: No Based Precautions: Patient Has Alerts: Yes Patient Alerts: Patient on Blood Thinner Electronic Signature(s) Signed: 05/12/2015 5:16:59 PM By: Gretta Cool, RN, BSN, Kim RN, BSN Entered By: Gretta Cool, RN, BSN, Kim on 05/12/2015 09:14:07 Rick Gilmore ([id number]) -------------------------------------------------------------------------------- Clinic Level of Care Assessment Details Patient Name: Rick Gilmore Date of Service: 05/12/2015 8:45 AM Medical Record Number: [id number] Patient Account Number: [id number] Date of Birth/Sex: 08/22/1941 (74 y.o. Male) Treating RN: Montey Hora Primary Care Physician: Deborra Medina Other Clinician: Referring Physician: Deborra Medina Treating Physician/Extender: [name] in Treatment: 8 Clinic Level of Care Assessment Items TOOL 4 Quantity Score []  - Use when only an EandM is performed on FOLLOW-UP visit 0 ASSESSMENTS - Nursing Assessment / Reassessment X -  Reassessment of Co-morbidities (includes updates in patient status) 1 10 X - Reassessment of Adherence to Treatment Plan 1 5 ASSESSMENTS - Wound and Skin Assessment / Reassessment X - Simple Wound Assessment / Reassessment - one wound 1 5 []  - Complex Wound Assessment / Reassessment - multiple wounds 0 []  - Dermatologic / Skin Assessment (not related to wound area) 0 ASSESSMENTS - Focused Assessment X - Circumferential Edema Measurements - multi extremities 1 5 []  - Nutritional Assessment / Counseling / Intervention 0 X - Lower Extremity Assessment (monofilament, tuning fork, pulses) 1 5 []  - Peripheral Arterial Disease Assessment (using hand held doppler) 0 ASSESSMENTS - Ostomy and/or Continence Assessment and Care []  - Incontinence Assessment and Management 0 []  - Ostomy Care Assessment and Management (repouching, etc.) 0 PROCESS - Coordination of Care X - Simple Patient / Family Education for ongoing care 1 15 []  - Complex (extensive) Patient / Family Education for ongoing care 0 X - Staff obtains Programmer, systems, Records, Test Results / Process Orders 1 10 []  - Staff telephones HHA, Nursing Homes / Clarify orders / etc 0 []  - Routine Transfer to another Facility (non-emergent condition) 0 Rick Gilmore, Rick Gilmore ([id number]) []  - Routine Hospital Admission (non-emergent condition) 0 []  - New Admissions / Biomedical engineer / Ordering NPWT, Apligraf, etc. 0 []  - Emergency Hospital Admission (emergent condition) 0 X - Simple Discharge Coordination 1 10 []  - Complex (extensive) Discharge Coordination 0 PROCESS - Special Needs []  - Pediatric / Minor Patient Management 0 []  - Isolation Patient Management 0 []  - Hearing / Language / Visual special needs 0 []  - Assessment of Community assistance (transportation, D/C planning, etc.) 0 []  - Additional assistance / Altered mentation 0 []  - Support Surface(s) Assessment (bed, cushion, seat, etc.) 0 INTERVENTIONS - Wound Cleansing / Measurement X -  Simple Wound Cleansing - one wound 1 5 []  - Complex Wound Cleansing - multiple wounds 0 X - Wound Imaging (photographs - any number of  wounds) 1 5 []  - Wound Tracing (instead of photographs) 0 X - Simple Wound Measurement - one wound 1 5 []  - Complex Wound Measurement - multiple wounds 0 INTERVENTIONS - Wound Dressings X - Small Wound Dressing one or multiple wounds 1 10 []  - Medium Wound Dressing one or multiple wounds 0 []  - Large Wound Dressing one or multiple wounds 0 []  - Application of Medications - topical 0 []  - Application of Medications - injection 0 INTERVENTIONS - Miscellaneous []  - External ear exam 0 Rick Gilmore, Rick Gilmore ([id number]) []  - Specimen Collection (cultures, biopsies, blood, body fluids, etc.) 0 []  - Specimen(s) / Culture(s) sent or taken to Lab for analysis 0 []  - Patient Transfer (multiple staff / Civil Service fast streamer / Similar devices) 0 []  - Simple Staple / Suture removal (25 or less) 0 []  - Complex Staple / Suture removal (26 or more) 0 []  - Hypo / Hyperglycemic Management (close monitor of Blood Glucose) 0 []  - Ankle / Brachial Index (ABI) - do not check if billed separately 0 X - Vital Signs 1 5 Has the patient been seen at the hospital within the last three years: Yes Total Score: 95 Level Of Care: New/Established - Level 3 Electronic Signature(s) Signed: 05/12/2015 10:06:01 AM By: Montey Hora Entered By: Montey Hora on 05/12/2015 10:06:00 Rick Gilmore ([id number]) -------------------------------------------------------------------------------- Encounter Discharge Information Details Patient Name: Rick Gilmore Date of Service: 05/12/2015 8:45 AM Medical Record Number: [id number] Patient Account Number: [id number] Date of Birth/Sex: 1941-07-04 (74 y.o. Male) Treating RN: Primary Care Physician: Deborra Medina Other Clinician: Referring Physician: Deborra Medina Treating Physician/Extender: [name] in Treatment: 8 Encounter Discharge Information  Items Discharge Pain Level: 0 Discharge Condition: Stable Ambulatory Status: Ambulatory Discharge Destination: Home Private Transportation: Auto Accompanied By: self Schedule Follow-up Appointment: Yes Medication Reconciliation completed and Yes provided to Patient/Care Nialah Saravia: Clinical Summary of Care: Provided Form Type Recipient Paper Patient [facility] Electronic Signature(s) Signed: 05/12/2015 5:16:59 PM By: Gretta Cool, RN, BSN, Kim RN, BSN Previous Signature: 05/12/2015 9:39:25 AM Version By: Sharon Mt Entered By: Gretta Cool RN, BSN, Kim on 05/12/2015 09:42:18 Rick Gilmore ([id number]) -------------------------------------------------------------------------------- Lower Extremity Assessment Details Patient Name: Rick Gilmore Date of Service: 05/12/2015 8:45 AM Medical Record Number: [id number] Patient Account Number: [id number] Date of Birth/Sex: [birth year] (73 y.o. Male) Treating RN: Cornell Barman Primary Care Physician: Deborra Medina Other Clinician: Referring Physician: Deborra Medina Treating Physician/Extender: [name] in Treatment: 8 Edema Assessment Assessed: Shirlyn Goltz: No] [Right: No] E[Left: dema] [Right: :] Calf Left: Right: Point of Measurement: cm From Medial Instep 40 cm cm Ankle Left: Right: Point of Measurement: cm From Medial Instep 24.8 cm cm Vascular Assessment Pulses: Posterior Tibial Dorsalis Pedis Palpable: [Left:Yes] Extremity colors, hair growth, and conditions: Extremity Color: [Left:Normal] Hair Growth on Extremity: [Left:Yes] Temperature of Extremity: [Left:Warm] Capillary Refill: [Left:< 3 seconds] Toe Nail Assessment Left: Right: Thick: No Discolored: No Deformed: No Improper Length and Hygiene: No Electronic Signature(s) Signed: 05/12/2015 5:16:59 PM By: Gretta Cool, RN, BSN, Kim RN, BSN Entered By: Gretta Cool, RN, BSN, Kim on 05/12/2015 09:17:46 Rick Gilmore  ([id number]) -------------------------------------------------------------------------------- Multi Wound Chart Details Patient Name: Rick Gilmore Date of Service: 05/12/2015 8:45 AM Medical Record Number: [id number] Patient Account Number: [id number] Date of Birth/Sex: 06-15-1941 (74 y.o. Male) Treating RN: Montey Hora Primary Care Physician: Deborra Medina Other Clinician: Referring Physician: Deborra Medina Treating Physician/Extender: [name] in Treatment: 8 Vital Signs Height(in): 75 Pulse(bpm): 66 Weight(lbs): 252 Blood Pressure 90/58 (mmHg): Body Mass Index(BMI): 31 Temperature(F): 98.0 Respiratory Rate 16 (breaths/min):  Photos: [1:No Photos] [[name]:[name]] Wound Location: [1:Left Lower Leg] [[name]:[name]] Wounding Event: [1:Trauma] [[name]:[name]] Primary Etiology: [1:Trauma, Other] [[name]:[name]] Comorbid History: [1:Anemia, Chronic Obstructive Pulmonary Disease (COPD), Sleep Apnea, Arrhythmia, Congestive Heart Failure, Coronary Artery Disease, Hypertension, Myocardial Infarction, Gout, Received Radiation] [[name]:[name]] Date Acquired: [1:03/04/2015] [[name]:[name]] Weeks of Treatment: [1:8] [[name]:[name]] Wound Status: [1:Open] [[name]:[name]] Measurements L x W x D 1.3x0.9x0.7 [[name]:[name]] (cm) Area (cm) : [1:0.919] [[name]:[name]] Volume (cm) : [1:0.643] [[name]:[name]] % Reduction in Area: [1:-21.90%] [[name]:[name]] % Reduction in Volume: -325.80% [[name]:[name]] Starting Position 1 6 (o'clock): Ending Position 1 [1:9] (o'clock): Maximum Distance 1 0.7 (cm): Undermining: [1:Yes] [[name]:[name]] Classification: [[name]:[name]] Full Thickness Without Exposed Support Structures Exudate Amount: Medium [name] [name] Exudate Type: Serosanguineous [name] [name] Exudate Color: red, brown [name] [name] Wound Margin: Distinct, outline attached [name] [name] Granulation Amount: Large (67-100%) [name] [name] Granulation Quality: Red [name] [name] Necrotic Amount: Small (1-33%) [name] [name] Exposed Structures: Fascia: No [name] [name] Fat: No Tendon: No Muscle:  No Joint: No Bone: No Limited to Skin Breakdown Epithelialization: Small (1-33%) [name] [name] Periwound Skin Texture: Edema: Yes [name] [name] Induration: Yes Excoriation: No Callus: No Crepitus: No Fluctuance: No Friable: No Rash: No Scarring: No Periwound Skin No Abnormalities Noted [name] [name] Moisture: Periwound Skin Color: Hemosiderin Staining: Yes [name] [name] Erythema: No Temperature: No Abnormality [name] [name] Tenderness on No [name] [name] Palpation: Wound Preparation: Ulcer Cleansing: [name] [name] Rinsed/Irrigated with Saline Topical Anesthetic Applied: Other: 4% lidocaine cream Treatment Notes Electronic Signature(s) Signed: 05/12/2015 5:11:42 PM By: Montey Hora Entered By: Montey Hora on 05/12/2015 09:28:21 Rick Gilmore, Rick Gilmore ([id number]) Rick Gilmore, Rick Gilmore ([id number]) -------------------------------------------------------------------------------- Multi-Disciplinary Care Plan Details Patient Name: Rick Gilmore, Rick Gilmore Date of Service: 05/12/2015 8:45 AM Medical Record Number: [id number] Patient Account Number: [id number] Date of Birth/Sex: 04/13/1941 (74 y.o. Male) Treating RN: Montey Hora Primary Care Physician: Deborra Medina Other Clinician: Referring Physician: Deborra Medina Treating Physician/Extender: [name] in Treatment: 8 Active Inactive Abuse / Safety / Falls / Self Care Management Nursing Diagnoses: Potential for falls Goals: Patient will remain injury free Date Initiated: 03/17/2015 Goal Status: Active Interventions: Assess fall risk on admission and as needed Notes: Necrotic Tissue Nursing Diagnoses: Impaired tissue integrity related to necrotic/devitalized tissue Goals: Necrotic/devitalized tissue will be minimized in the wound bed Date Initiated: 03/17/2015 Goal Status: Active Interventions: Assess patient pain level pre-, during and post procedure and prior to discharge Treatment Activities: Apply topical anesthetic as ordered : 05/12/2015 Notes: Orientation  to the Wound Care Program Nursing Diagnoses: Knowledge deficit related to the wound healing center program Rick Gilmore, Rick Gilmore ([id number]) Goals: Patient/caregiver will verbalize understanding of the [place] Program Date Initiated: 03/17/2015 Goal Status: Active Interventions: Provide education on orientation to the wound center Notes: Wound/Skin Impairment Nursing Diagnoses: Impaired tissue integrity Goals: Patient/caregiver will verbalize understanding of skin care regimen Date Initiated: 03/17/2015 Goal Status: Active Ulcer/skin breakdown will heal within 14 weeks Date Initiated: 03/17/2015 Goal Status: Active Interventions: Assess patient/caregiver ability to obtain necessary supplies Notes: Electronic Signature(s) Signed: 05/12/2015 5:11:42 PM By: Montey Hora Entered By: Montey Hora on 05/12/2015 09:28:09 Rick Gilmore ([id number]) -------------------------------------------------------------------------------- Pain Assessment Details Patient Name: Rick Gilmore Date of Service: 05/12/2015 8:45 AM Medical Record Number: [id number] Patient Account Number: [id number] Date of Birth/Sex: [birth year] (74 y.o. Male) Treating RN: Cornell Barman Primary Care Physician: Deborra Medina Other Clinician: Referring Physician: Deborra Medina Treating Physician/Extender: [name] in Treatment: 8 Active Problems Location of Pain Severity and Description of Pain Patient Has Paino No Site Locations Pain Management and Medication Current Pain Management: Electronic  Signature(s) Signed: 05/12/2015 5:16:59 PM By: Gretta Cool, RN, BSN, Kim RN, BSN Entered By: Gretta Cool, RN, BSN, Kim on 05/12/2015 09:15:04 Rick Gilmore ([id number]) -------------------------------------------------------------------------------- Patient/Caregiver Education Details Patient Name: Rick Gilmore, Rick Gilmore Date of Service: 05/12/2015 8:45 AM Medical Record Number: [id number] Patient Account Number: [id number] Date  of Birth/Gender: [birth year] (74 y.o. Male) Treating RN: Montey Hora Primary Care Physician: Deborra Medina Other Clinician: Referring Physician: Deborra Medina Treating Physician/Extender: [name] in Treatment: 8 Education Assessment Education Provided To: Patient Education Topics Provided Wound/Skin Impairment: Handouts: Other: wound healing cycle by MD Methods: Explain/Verbal Responses: State content correctly Electronic Signature(s) Signed: 05/12/2015 5:11:42 PM By: Montey Hora Entered By: Montey Hora on 05/12/2015 09:30:14 Rick Gilmore ([id number]) -------------------------------------------------------------------------------- Wound Assessment Details Patient Name: Rick Gilmore Date of Service: 05/12/2015 8:45 AM Medical Record Number: [id number] Patient Account Number: [id number] Date of Birth/Sex: March 30, 1941 (74 y.o. Male) Treating RN: Cornell Barman Primary Care Physician: Deborra Medina Other Clinician: Referring Physician: Deborra Medina Treating Physician/Extender: [name] in Treatment: 8 Wound Status Wound Number: 1 Primary Trauma, Other Etiology: Wound Location: Left Lower Leg Wound Open Wounding Event: Trauma Status: Date Acquired: 03/04/2015 Comorbid Anemia, Chronic Obstructive Pulmonary Weeks Of Treatment: 8 History: Disease (COPD), Sleep Apnea, Clustered Wound: No Arrhythmia, Congestive Heart Failure, Coronary Artery Disease, Hypertension, Myocardial Infarction, Gout, Received Radiation Photos Photo Uploaded By: Gretta Cool, RN, BSN, Kim on 05/12/2015 11:06:32 Wound Measurements Length: (cm) 1.3 Width: (cm) 0.9 Depth: (cm) 0.7 Area: (cm) 0.919 Volume: (cm) 0.643 % Reduction in Area: -21.9% % Reduction in Volume: -325.8% Epithelialization: Small (1-33%) Undermining: Yes Starting Position (o'clock): 6 Ending Position (o'clock): 9 Maximum Distance: (cm) 0.7 Wound Description Full Thickness Without  Exposed Classification: Support Structures Wound Margin: Distinct, outline attached Exudate Medium Amount: Rick Gilmore, Rick Gilmore ([id number]) Foul Odor After Cleansing: No Exudate Type: Serosanguineous Exudate Color: red, brown Wound Bed Granulation Amount: Large (67-100%) Exposed Structure Granulation Quality: Red Fascia Exposed: No Necrotic Amount: Small (1-33%) Fat Layer Exposed: No Necrotic Quality: Adherent Slough Tendon Exposed: No Muscle Exposed: No Joint Exposed: No Bone Exposed: No Limited to Skin Breakdown Periwound Skin Texture Texture Color No Abnormalities Noted: No No Abnormalities Noted: No Callus: No Erythema: No Crepitus: No Hemosiderin Staining: Yes Excoriation: No Temperature / Pain Fluctuance: No Temperature: No Abnormality Friable: No Induration: Yes Localized Edema: Yes Rash: No Scarring: No Moisture No Abnormalities Noted: Yes Wound Preparation Ulcer Cleansing: Rinsed/Irrigated with Saline Topical Anesthetic Applied: Other: 4% lidocaine cream, Treatment Notes Wound #1 (Left Lower Leg) 1. Cleansed with: Clean wound with Normal Saline 2. Anesthetic Topical Lidocaine 4% cream to wound bed prior to debridement 4. Dressing Applied: Iodoform packing Gauze 5. Secondary Dressing Applied Bordered Foam Dressing Notes Skin sub authorizations Electronic Signature(s) Rick Gilmore, Rick Gilmore ([id number]) Signed: 05/12/2015 5:16:59 PM By: Gretta Cool, RN, BSN, Kim RN, BSN Entered By: Gretta Cool, RN, BSN, Kim on 05/12/2015 09:20:38 Rick Gilmore ([id number]) -------------------------------------------------------------------------------- [place] Details Patient Name: Rick Gilmore Date of Service: 05/12/2015 8:45 AM Medical Record Number: [id number] Patient Account Number: [id number] Date of Birth/Sex: 04/04/1941 (73 y.o. Male) Treating RN: Cornell Barman Primary Care Physician: Deborra Medina Other Clinician: Referring Physician: Deborra Medina Treating Physician/Extender:  [name] in Treatment: 8 Vital Signs Time Taken: 09:15 Temperature (F): 98.0 Height (in): 75 Pulse (bpm): 66 Weight (lbs): 252 Respiratory Rate (breaths/min): 16 Body Mass Index (BMI): 31.5 Blood Pressure (mmHg): 90/58 Reference Range: 80 - 120 mg / dl Electronic Signature(s) Signed: 05/12/2015 5:16:59 PM By: Gretta Cool, RN, BSN, Kim RN, BSN Entered By: Gretta Cool, RN, BSN, Kim on 05/12/2015  09:18:03 

## 2015-05-13 NOTE — Progress Notes (Signed)
DIMARCO, HOLSTAD ([id number]) Visit Report for 05/12/2015 Chief Complaint Document Details Patient Name: Rick Gilmore, Rick Gilmore Date of Service: 05/12/2015 8:45 AM Medical Record Number: [id number] Patient Account Number: [id number] Date of Birth/Sex: Jul 23, 1941 (74 y.o. Male) Treating RN: Primary Care Physician: Deborra Medina Other Clinician: Referring Physician: Deborra Medina Treating Physician/Extender: [name] in Treatment: 8 Information Obtained from: Patient Chief Complaint Patient presents to the wound care center for a consult due non healing wound 74 year old gentleman comes with a history of having a injury to his left lower extremity on 03/04/15,which was seen in the ER about 10 days ago. Electronic Signature(s) Signed: 05/12/2015 12:34:34 PM By: [name] MD, FACS Entered By: [name] on 05/12/2015 09:36:09 Rick Gilmore, Rick Gilmore ([id number]) -------------------------------------------------------------------------------- HPI Details Patient Name: Rick Gilmore Date of Service: 05/12/2015 8:45 AM Medical Record Number: [id number] Patient Account Number: [id number] Date of Birth/Sex: [birth year] (74 y.o. Male) Treating RN: Primary Care Physician: Deborra Medina Other Clinician: Referring Physician: Deborra Medina Treating Physician/Extender: [name] in Treatment: 8 History of Present Illness Location: left lower extremity Quality: Patient reports experiencing a dull pain to affected area(s). Severity: Patient states wound (s) are getting better. Duration: Patient has had the wound for < 2 weeks prior to presenting for treatment Timing: Pain in wound is Intermittent (comes and goes Context: The wound occurred when the patient after he had a fall and hurt himself. Modifying Factors: Patient must stand for long periods while working Associated Signs and Symptoms: Patient reports presence of swelling HPI Description: 74 year old gentleman who has a past medical history  significant for gout, edema of the lower limbs, anemia, hypertension, acid reflux, depression, COPD, coronary artery disease and history of sleep apnea. He has had a bypass surgery in 1988 and has had prostate cancer with radiation and seed implants in 2002. He is also known to have a pacemaker and defibrillator placed in 2014. he has been seen in the ER on 3 different occasions since the fall and they have been applying a dry dressing and some labs were done the last time around. No x-rays or ultrasound has been done. The patient is on an anticoagulant and this has not been held at any stage. During his ER notes from 03/10/2015 is noted that he had a wound on the left lower extremity which she's had for about 10 days now. The patient was noted to have extensive ecchymosis and tenderness and a laceration to the left lower extremity. Note is made of the fact that he had no signs of cellulitis and his labs were normal. He was given symptomatic treatment and asked to follow-up in the wound center. 03/24/2015 -- he has been doing fine and his dressing has been done by his family members and is at no issues. His treatment the blood thinner continuous. He will be going out of town this week and will be able to see as next week on Tuesday. 05/12/2015 -- it has been about 8 weeks since we are treating him with conservative therapy and is doing pretty well though there is some depth to the area and he continues to have to pack it. Electronic Signature(s) Signed: 05/12/2015 12:34:34 PM By: [name] MD, FACS Entered By: [name] on 05/12/2015 09:36:47 Rick Gilmore, Rick Gilmore ([id number]) -------------------------------------------------------------------------------- Physical Exam Details Patient Name: Rick Gilmore Date of Service: 05/12/2015 8:45 AM Medical Record Number: [id number] Patient Account Number: [id number] Date of Birth/Sex: 05/09/1941 (74 y.o. Male) Treating RN: Primary Care Physician:  Deborra Medina Other Clinician: Referring Physician: Deborra Medina  Treating Physician/Extender: [name] in Treatment: 8 Constitutional . Pulse regular. Respirations normal and unlabored. Afebrile. . Eyes Nonicteric. Reactive to light. Ears, Nose, Mouth, and Throat Lips, teeth, and gums WNL.Marland Kitchen Moist mucosa without lesions . Neck supple and nontender. No palpable supraclavicular or cervical adenopathy. Normal sized without goiter. Respiratory WNL. No retractions.. Cardiovascular Pedal Pulses WNL. No clubbing, cyanosis or edema. Integumentary (Hair, Skin) there is good epithelialization from the lateral to the medial side and superiorly. He has an undermining between the 6 to 8:00 position and this is about 1.5 cm in depth.. No crepitus or fluctuance. No peri-wound warmth or erythema. No masses.Marland Kitchen Psychiatric Judgement and insight Intact.. No evidence of depression, anxiety, or agitation.. Electronic Signature(s) Signed: 05/12/2015 12:34:34 PM By: [name] MD, FACS Entered By: [name] on 05/12/2015 09:37:51 Rick Gilmore ([id number]) -------------------------------------------------------------------------------- Physician Orders Details Patient Name: Rick Gilmore Date of Service: 05/12/2015 8:45 AM Medical Record Number: [id number] Patient Account Number: [id number] Date of Birth/Sex: [birth year] (74 y.o. Male) Treating RN: Montey Hora Primary Care Physician: Deborra Medina Other Clinician: Referring Physician: Deborra Medina Treating Physician/Extender: [name] in Treatment: 8 Verbal / Phone Orders: Yes Clinician: Montey Hora Read Back and Verified: Yes Diagnosis Coding Wound Cleansing Wound #1 Left Lower Leg o Clean wound with Normal Saline. Anesthetic Wound #1 Left Lower Leg o Topical Lidocaine 4% cream applied to wound bed prior to debridement Primary Wound Dressing Wound #1 Left Lower Leg o Iodoform packing Gauze Secondary  Dressing Wound #1 Left Lower Leg o Boardered Foam Dressing Dressing Change Frequency Wound #1 Left Lower Leg o Change dressing every other day. Follow-up Appointments Wound #1 Left Lower Leg o Return Appointment in 1 week. Additional Orders / Instructions Wound #1 Left Lower Leg o Increase protein intake. o Activity as tolerated Electronic Signature(s) Signed: 05/12/2015 12:34:34 PM By: [name] MD, FACS Signed: 05/12/2015 5:11:42 PM By: Montey Hora Entered By: Montey Hora on 05/12/2015 09:31:34 Rick Gilmore, Rick Gilmore ([id number]) [place], [place] ([id number]) -------------------------------------------------------------------------------- Problem List Details Patient Name: Rick Gilmore Date of Service: 05/12/2015 8:45 AM Medical Record Number: [id number] Patient Account Number: [id number] Date of Birth/Sex: 11/22/1941 (74 y.o. 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 Male) Treating RN: Primary Care Physician: Deborra Medina Other Clinician: Referring Physician: Deborra Medina Treating Physician/Extender: [name] in  Treatment: 8 Subjective Chief Complaint Information obtained from Patient Patient presents to the wound care center for a consult due non healing wound 74 year old gentleman comes with a history of having a injury to his left lower extremity on 03/04/15,which was seen in the ER about 10 days ago. History of Present Illness (HPI) The following HPI elements were documented for the patient's wound: Location: left lower extremity Quality: Patient reports experiencing a dull pain to affected area(s). Severity: Patient states wound (s) are getting better. Duration: Patient has had the wound for < 2 weeks prior to presenting for treatment Timing:  Pain in wound is Intermittent (comes and goes Context: The wound occurred when the patient after he had a fall and hurt himself. Modifying Factors: Patient must stand for long periods while working Associated Signs and Symptoms: Patient reports presence of swelling 74 year old gentleman who has a past medical history significant for gout, edema of the lower limbs, anemia, hypertension, acid reflux, depression, COPD, coronary artery disease and history of sleep apnea. He has had a bypass surgery in 1988 and has had prostate cancer with radiation and seed implants in 2002. He is also known to have a pacemaker and defibrillator placed in 2014. he has been seen in the ER on 3 different occasions since the fall and they have been applying a dry dressing and some labs were done the last time around. No x-rays or ultrasound has been done. The patient is on an anticoagulant and this has not been held at any stage. During his ER notes from 03/10/2015 is noted that he had a wound on the left lower extremity which she's had for about 10 days now. The patient was noted to have extensive ecchymosis and tenderness and a laceration to the left lower extremity. Note is made of the fact that he had no signs of cellulitis and his labs were normal. He was given symptomatic  treatment and asked to follow-up in the wound center. 03/24/2015 -- he has been doing fine and his dressing has been done by his family members and is at no issues. His treatment the blood thinner continuous. He will be going out of town this week and will be able to see as next week on Tuesday. 05/12/2015 -- it has been about 8 weeks since we are treating him with conservative therapy and is doing pretty well though there is some depth to the area and he continues to have to pack it. Rick Gilmore, Rick Gilmore ([id number]) Objective Constitutional Pulse regular. Respirations normal and unlabored. Afebrile. Vitals Time Taken: 9:15 AM, Height: 75 in, Weight: 252 lbs, BMI: 31.5, Temperature: 98.0 F, Pulse: 66 bpm, Respiratory Rate: 16 breaths/min, Blood Pressure: 90/58 mmHg. Eyes Nonicteric. Reactive to light. Ears, Nose, Mouth, and Throat Lips, teeth, and gums WNL.Marland Kitchen Moist mucosa without lesions . Neck supple and nontender. No palpable supraclavicular or cervical adenopathy. Normal sized without goiter. Respiratory WNL. No retractions.. Cardiovascular Pedal Pulses WNL. No clubbing, cyanosis or edema. Psychiatric Judgement and insight Intact.. No evidence of depression, anxiety, or agitation.. Integumentary (Hair, Skin) there is good epithelialization from the lateral to the medial side and superiorly. He has an undermining between the 6 to 8:00 position and this is about 1.5 cm in depth.. No crepitus or fluctuance. No peri-wound warmth or erythema. No masses.. Wound #1 status is Open. Original cause of wound was Trauma. The wound is located on the Left Lower Leg. The wound measures 1.3cm length x 0.9cm width x 0.7cm depth; 0.919cm^2 area and 0.643cm^3 volume. The wound is limited to skin breakdown. There is undermining starting at 6:00 and ending at 9:00 with a maximum distance of 0.7cm. There is a medium amount of serosanguineous drainage noted. The wound margin is distinct with the outline  attached to the wound base. There is large (67-100%) red granulation within the wound bed. There is a small (1-33%) amount of necrotic tissue within the wound bed including Adherent Slough. The periwound skin appearance had no abnormalities noted for moisture. The periwound skin appearance exhibited: Induration, Localized Edema, Hemosiderin Staining. The periwound skin appearance did not exhibit: Callus, Crepitus, Excoriation,  Fluctuance, Friable, Rash, Scarring, Erythema. Periwound temperature was noted as No Abnormality. Rick Gilmore, Rick Gilmore ([id number]) There is good epithelialization from the superio-lateral to the medial side and superiorly. He has an undermining between the 6 to 8:00 position and this is about 1.5 cm in depth. Assessment Active Problems ICD-10 L97.222 - Non-pressure chronic ulcer of left calf with fat layer exposed S81.812A - Laceration without foreign body, left lower leg, initial encounter At this stage the wound is slow to fill up and it may be beneficial to try and see if a skin substitute like Epifix will hasten the process of healing. We will apply for this and see what his insurance will cover. I will see him back next week. Plan Wound Cleansing: Wound #1 Left Lower Leg: Clean wound with Normal Saline. Anesthetic: Wound #1 Left Lower Leg: Topical Lidocaine 4% cream applied to wound bed prior to debridement Primary Wound Dressing: Wound #1 Left Lower Leg: Iodoform packing Gauze Secondary Dressing: Wound #1 Left Lower Leg: Boardered Foam Dressing Dressing Change Frequency: Wound #1 Left Lower Leg: Change dressing every other day. Follow-up Appointments: Wound #1 Left Lower Leg: Return Appointment in 1 week. Additional Orders / Instructions: Wound #1 Left Lower Leg: Increase protein intake. Activity as tolerated Rick Gilmore, Rick Gilmore ([id number]) At this stage the wound is slow to fill up and it may be beneficial to try and see if a skin substitute like  Epifix will hasten the process of healing. We will apply for this and see what his insurance will cover. I will see him back next week. Electronic Signature(s) Signed: 05/12/2015 12:34:34 PM By: [name] MD, FACS Entered By: [name] on 05/12/2015 09:46:18 Rick Gilmore, Rick Gilmore ([id number]) -------------------------------------------------------------------------------- SuperBill Details Patient Name: Rick Gilmore Date of Service: 05/12/2015 Medical Record Number: [id number] Patient Account Number: [id number] Date of Birth/Sex: [birth year] (74 y.o. Male) Treating RN: Primary Care Physician: Deborra Medina Other Clinician: Referring Physician: Deborra Medina Treating Physician/Extender: [name] in Treatment: 8 Diagnosis Coding ICD-10 Codes Code Description [phone] Non-pressure chronic ulcer of left calf with fat layer exposed S81.812A Laceration without foreign body, left lower leg, initial encounter Facility Procedures CPT4 Code: [id number] Description: [id number] - WOUND CARE VISIT-LEV 3 EST PT Modifier: Quantity: 1 Physician Procedures CPT4 Code Description: [id number] - WC PHYS LEVEL 3 - EST PT ICD-10 Description Diagnosis L97.222 Non-pressure chronic ulcer of left calf with fat lay S81.812A Laceration without foreign body, left lower leg, ini Modifier: er exposed tial encounter Quantity: 1 Electronic Signature(s) Signed: 05/12/2015 10:06:11 AM By: Montey Hora Signed: 05/12/2015 12:34:34 PM By: [name] MD, FACS Entered By: Montey Hora on 05/12/2015 10:06:11

## 2015-05-19 ENCOUNTER — Encounter: Payer: Medicare Other | Admitting: Surgery

## 2015-05-19 DIAGNOSIS — J449 Chronic obstructive pulmonary disease, unspecified: Secondary | ICD-10-CM | POA: Diagnosis not present

## 2015-05-19 DIAGNOSIS — S81802A Unspecified open wound, left lower leg, initial encounter: Secondary | ICD-10-CM | POA: Diagnosis not present

## 2015-05-19 DIAGNOSIS — L97222 Non-pressure chronic ulcer of left calf with fat layer exposed: Secondary | ICD-10-CM | POA: Diagnosis not present

## 2015-05-19 DIAGNOSIS — S81812A Laceration without foreign body, left lower leg, initial encounter: Secondary | ICD-10-CM | POA: Diagnosis not present

## 2015-05-19 DIAGNOSIS — I10 Essential (primary) hypertension: Secondary | ICD-10-CM | POA: Diagnosis not present

## 2015-05-19 DIAGNOSIS — I2510 Atherosclerotic heart disease of native coronary artery without angina pectoris: Secondary | ICD-10-CM | POA: Diagnosis not present

## 2015-05-19 DIAGNOSIS — R600 Localized edema: Secondary | ICD-10-CM | POA: Diagnosis not present

## 2015-05-19 NOTE — Progress Notes (Signed)
Rick Gilmore, Rick Gilmore ([id number]) Visit Report for 05/19/2015 Arrival Information Details Patient Name: Rick Gilmore, Rick Gilmore Date of Service: 05/19/2015 8:45 AM Medical Record Number: [id number] Patient Account Number: [id number] Date of Birth/Sex: 03-31-1941 (74 y.o. Male) Treating RN: Baruch Gouty, RN, BSN, Velva Harman Primary Care Physician: Deborra Medina Other Clinician: Referring Physician: Deborra Medina Treating Physician/Extender: [name] in Treatment: 9 Visit Information History Since Last Visit Any new allergies or adverse reactions: No Patient Arrived: Ambulatory Had a fall or experienced change in No Arrival Time: 08:47 activities of daily living that may affect Accompanied By: self risk of falls: Transfer Assistance: None Signs or symptoms of abuse/neglect since last No Patient Identification Verified: Yes visito Secondary Verification Process Yes Hospitalized since last visit: No Completed: Has Dressing in Place as Prescribed: Yes Patient Requires Transmission- No Pain Present Now: No Based Precautions: Patient Has Alerts: Yes Patient Alerts: Patient on Blood Thinner Electronic Signature(s) Signed: 05/19/2015 3:19:52 PM By: Regan Lemming BSN, RN Entered By: Regan Lemming on 05/19/2015 08:47:34 Rick Gilmore ([id number]) -------------------------------------------------------------------------------- Clinic Level of Care Assessment Details Patient Name: Rick Gilmore Date of Service: 05/19/2015 8:45 AM Medical Record Number: [id number] Patient Account Number: [id number] Date of Birth/Sex: [birth year] (74 y.o. Male) Treating RN: Montey Hora Primary Care Physician: Deborra Medina Other Clinician: Referring Physician: Deborra Medina Treating Physician/Extender: [name] in Treatment: 9 Clinic Level of Care Assessment Items TOOL 4 Quantity Score []  - Use when only an EandM is performed on FOLLOW-UP visit 0 ASSESSMENTS - Nursing Assessment / Reassessment X -  Reassessment of Co-morbidities (includes updates in patient status) 1 10 X - Reassessment of Adherence to Treatment Plan 1 5 ASSESSMENTS - Wound and Skin Assessment / Reassessment []  - Simple Wound Assessment / Reassessment - one wound 0 X - Complex Wound Assessment / Reassessment - multiple wounds 2 5 []  - Dermatologic / Skin Assessment (not related to wound area) 0 ASSESSMENTS - Focused Assessment X - Circumferential Edema Measurements - multi extremities 1 5 []  - Nutritional Assessment / Counseling / Intervention 0 X - Lower Extremity Assessment (monofilament, tuning fork, pulses) 1 5 []  - Peripheral Arterial Disease Assessment (using hand held doppler) 0 ASSESSMENTS - Ostomy and/or Continence Assessment and Care []  - Incontinence Assessment and Management 0 []  - Ostomy Care Assessment and Management (repouching, etc.) 0 PROCESS - Coordination of Care X - Simple Patient / Family Education for ongoing care 1 15 []  - Complex (extensive) Patient / Family Education for ongoing care 0 []  - Staff obtains Programmer, systems, Records, Test Results / Process Orders 0 []  - Staff telephones HHA, Nursing Homes / Clarify orders / etc 0 []  - Routine Transfer to another Facility (non-emergent condition) 0 Rick Gilmore, Rick Gilmore ([id number]) []  - Routine Hospital Admission (non-emergent condition) 0 []  - New Admissions / Biomedical engineer / Ordering NPWT, Apligraf, etc. 0 []  - Emergency Hospital Admission (emergent condition) 0 X - Simple Discharge Coordination 1 10 []  - Complex (extensive) Discharge Coordination 0 PROCESS - Special Needs []  - Pediatric / Minor Patient Management 0 []  - Isolation Patient Management 0 []  - Hearing / Language / Visual special needs 0 []  - Assessment of Community assistance (transportation, D/C planning, etc.) 0 []  - Additional assistance / Altered mentation 0 []  - Support Surface(s) Assessment (bed, cushion, seat, etc.) 0 INTERVENTIONS - Wound Cleansing / Measurement []  -  Simple Wound Cleansing - one wound 0 X - Complex Wound Cleansing - multiple wounds 2 5 X - Wound Imaging (photographs - any number of wounds) 1 5 []  -  Wound Tracing (instead of photographs) 0 []  - Simple Wound Measurement - one wound 0 X - Complex Wound Measurement - multiple wounds 2 5 INTERVENTIONS - Wound Dressings X - Small Wound Dressing one or multiple wounds 2 10 []  - Medium Wound Dressing one or multiple wounds 0 []  - Large Wound Dressing one or multiple wounds 0 []  - Application of Medications - topical 0 []  - Application of Medications - injection 0 INTERVENTIONS - Miscellaneous []  - External ear exam 0 Rick Gilmore, Rick Gilmore ([id number]) []  - Specimen Collection (cultures, biopsies, blood, body fluids, etc.) 0 []  - Specimen(s) / Culture(s) sent or taken to Lab for analysis 0 []  - Patient Transfer (multiple staff / Civil Service fast streamer / Similar devices) 0 []  - Simple Staple / Suture removal (25 or less) 0 []  - Complex Staple / Suture removal (26 or more) 0 []  - Hypo / Hyperglycemic Management (close monitor of Blood Glucose) 0 []  - Ankle / Brachial Index (ABI) - do not check if billed separately 0 X - Vital Signs 1 5 Has the patient been seen at the hospital within the last three years: Yes Total Score: 110 Level Of Care: New/Established - Level 3 Electronic Signature(s) Signed: 05/19/2015 9:55:15 AM By: Montey Hora Entered By: Montey Hora on 05/19/2015 09:55:15 Rick Gilmore ([id number]) -------------------------------------------------------------------------------- Encounter Discharge Information Details Patient Name: Rick Gilmore Date of Service: 05/19/2015 8:45 AM Medical Record Number: [id number] Patient Account Number: [id number] Date of Birth/Sex: Aug 04, 1941 (74 y.o. Male) Treating RN: Baruch Gouty, RN, BSN, [place] Primary Care Physician: Deborra Medina Other Clinician: Referring Physician: Deborra Medina Treating Physician/Extender: [name] in Treatment: 9 Encounter  Discharge Information Items Discharge Pain Level: 0 Discharge Condition: Stable Ambulatory Status: Ambulatory Discharge Destination: Home Transportation: Private Auto Accompanied By: self Schedule Follow-up Appointment: No Medication Reconciliation completed and provided to Patient/Care No Keean Wilmeth: Provided on Clinical Summary of Care: 05/19/2015 Form Type Recipient Paper Patient [facility] Electronic Signature(s) Signed: 05/19/2015 3:19:52 PM By: Regan Lemming BSN, RN Previous Signature: 05/19/2015 9:33:01 AM Version By: Ruthine Dose Entered By: Regan Lemming on 05/19/2015 09:35:46 Rick Gilmore ([id number]) -------------------------------------------------------------------------------- Lower Extremity Assessment Details Patient Name: Rick Gilmore Date of Service: 05/19/2015 8:45 AM Medical Record Number: [id number] Patient Account Number: [id number] Date of Birth/Sex: [birth year] (74 y.o. Male) Treating RN: Baruch Gouty, RN, BSN, [place] Primary Care Physician: Deborra Medina Other Clinician: Referring Physician: Deborra Medina Treating Physician/Extender: [name] in Treatment: 9 Vascular Assessment Claudication: Claudication Assessment [Left:None] Pulses: Posterior Tibial Dorsalis Pedis Palpable: [Left:Yes] Extremity colors, hair growth, and conditions: Extremity Color: [Left:Mottled] Hair Growth on Extremity: [Left:Yes] Temperature of Extremity: [Left:Warm] Capillary Refill: [Left:< 3 seconds] Dependent Rubor: [Left:No] Blanched when Elevated: [Left:No] Lipodermatosclerosis: [Left:No] Toe Nail Assessment Left: Right: Thick: No Discolored: No Deformed: No Improper Length and Hygiene: No Electronic Signature(s) Signed: 05/19/2015 3:19:52 PM By: Regan Lemming BSN, RN Entered By: Regan Lemming on 05/19/2015 08:48:56 Rick Gilmore ([id number]) -------------------------------------------------------------------------------- Multi Wound Chart Details Patient Name: Rick Gilmore Date of Service: 05/19/2015 8:45 AM Medical Record Number: [id number] Patient Account Number: [id number] Date of Birth/Sex: 01-31-1941 (74 y.o. Male) Treating RN: Montey Hora Primary Care Physician: Deborra Medina Other Clinician: Referring Physician: Deborra Medina Treating Physician/Extender: [name] in Treatment: 9 Vital Signs Height(in): 75 Pulse(bpm): 68 Weight(lbs): 252 Blood Pressure 92/40 (mmHg): Body Mass Index(BMI): 31 Temperature(F): 97.8 Respiratory Rate 16 (breaths/min): Photos: [1:No Photos] [2:No Photos] [[name]:[name]] Wound Location: [1:Left Lower Leg] [2:Left Lower Leg - Anterior [name]] Wounding Event: [1:Trauma] [2:Shear/Friction] [[name]:[name]] Primary Etiology: [1:Trauma, Other] [2:Trauma, Other] [[name]:[name]]  Comorbid History: [1:Anemia, Chronic Obstructive Pulmonary Disease (COPD), Sleep Disease (COPD), Sleep Apnea, Arrhythmia, Congestive Heart Failure, Congestive Heart Failure, Coronary Artery Disease, Coronary Artery Disease, Hypertension, Myocardial  Hypertension, Myocardial Infarction, Gout, Received Infarction, Gout, Received Radiation] [2:Anemia, Chronic Obstructive Pulmonary Apnea, Arrhythmia, Radiation] [[name]:[name]] Date Acquired: [1:03/04/2015] [2:05/16/2015] [[name]:[name]] Weeks of Treatment: [1:9] [2:0] [[name]:[name]] Wound Status: [1:Open] [2:Open] [[name]:[name]] Measurements L x W x D 0.8x0.8x0.2 [2:6.6x2.4x0.1] [[name]:[name]] (cm) Area (cm) : [1:0.503] [2:12.441] [[name]:[name]] Volume (cm) : [1:0.101] [2:1.244] [[name]:[name]] % Reduction in Area: [1:33.30%] [2:0.00%] [[name]:[name]] % Reduction in Volume: 33.10% [2:0.00%] [[name]:[name]] Classification: [1:Full Thickness Without Exposed Support Structures] [2:Partial Thickness] [[name]:[name]] Exudate Amount: [1:Medium] [2:[name]] [[name]:[name]] Exudate Type: [1:Serosanguineous] [2:[name]] [[name]:[name]] Exudate Color: [1:red, brown] [2:[name]] [[name]:[name]] Wound Margin: [1:Distinct, outline attached Distinct, outline attached [name]] Granulation Amount:  [1:Large (67-100%)] [2:Large (67-100%)] [[name]:[name]] Granulation Quality: Red Red, Pink [name] Necrotic Amount: Small (1-33%) None Present (0%) [name] Exposed Structures: Fascia: No Fascia: No [name] Fat: No Fat: No Tendon: No Tendon: No Muscle: No Muscle: No Joint: No Joint: No Bone: No Bone: No Limited to Skin Limited to Skin Breakdown Breakdown Epithelialization: Small (1-33%) Medium (34-66%) [name] Periwound Skin Texture: Edema: Yes Edema: Yes [name] Induration: Yes Excoriation: No Excoriation: No Induration: No Callus: No Callus: No Crepitus: No Crepitus: No Fluctuance: No Fluctuance: No Friable: No Friable: No Rash: No Rash: No Scarring: No Scarring: No Periwound Skin No Abnormalities Noted Moist: Yes [name] Moisture: Maceration: No Dry/Scaly: No Periwound Skin Color: Hemosiderin Staining: Yes Atrophie Blanche: No [name] Erythema: No Cyanosis: No Ecchymosis: No Erythema: No Hemosiderin Staining: No Mottled: No Pallor: No Rubor: No Temperature: No Abnormality No Abnormality [name] Tenderness on No No [name] Palpation: Wound Preparation: Ulcer Cleansing: Ulcer Cleansing: [name] Rinsed/Irrigated with Rinsed/Irrigated with Saline Saline Topical Anesthetic Topical Anesthetic Applied: Other: 4% Applied: Other: lidocaine lidocaine cream 4% Treatment Notes Wound #1 (Left Lower Leg) 1. Cleansed with: Clean wound with Normal Saline 3. Peri-wound Care: Skin Prep 4. Dressing Applied: ROMULUS, CRAFTS ([id number]) Iodoform packing Gauze 5. Secondary Dressing Applied Dry Gauze 7. Secured with Tape Wound #2 (Left, Anterior Lower Leg) 1. Cleansed with: Clean wound with Normal Saline 3. Peri-wound Care: Skin Prep 4. Dressing Applied: Prisma Ag 5. Secondary Dressing Applied Dry Gauze Non-Adherent pad 7. Secured with Recruitment consultant) Signed: 05/19/2015 9:46:06 AM By: Montey Hora Entered By: Montey Hora on 05/19/2015 09:46:06 Rick Gilmore  ([id number]) -------------------------------------------------------------------------------- Multi-Disciplinary Care Plan Details Patient Name: Rick Gilmore Date of Service: 05/19/2015 8:45 AM Medical Record Number: [id number] Patient Account Number: [id number] Date of Birth/Sex: Jun 27, 1941 (74 y.o. Male) Treating RN: Montey Hora Primary Care Physician: Deborra Medina Other Clinician: Referring Physician: Deborra Medina Treating Physician/Extender: [name] in Treatment: 9 Active Inactive Abuse / Safety / Falls / Self Care Management Nursing Diagnoses: Potential for falls Goals: Patient will remain injury free Date Initiated: 03/17/2015 Goal Status: Active Interventions: Assess fall risk on admission and as needed Notes: Necrotic Tissue Nursing Diagnoses: Impaired tissue integrity related to necrotic/devitalized tissue Goals: Necrotic/devitalized tissue will be minimized in the wound bed Date Initiated: 03/17/2015 Goal Status: Active Interventions: Assess patient pain level pre-, during and post procedure and prior to discharge Treatment Activities: Apply topical anesthetic as ordered : 05/19/2015 Notes: Orientation to the Wound Care Program Nursing Diagnoses: Knowledge deficit related to the wound healing center program Rick Gilmore, Rick Gilmore ([id number]) Goals: Patient/caregiver will verbalize understanding of the [facility] Program Date Initiated: 03/17/2015 Goal Status: Active Interventions: Provide education on orientation to the wound center Notes: Wound/Skin Impairment  Nursing Diagnoses: Impaired tissue integrity Goals: Patient/caregiver will verbalize understanding of skin care regimen Date Initiated: 03/17/2015 Goal Status: Active Ulcer/skin breakdown will heal within 14 weeks Date Initiated: 03/17/2015 Goal Status: Active Interventions: Assess patient/caregiver ability to obtain necessary supplies Notes: Electronic Signature(s) Signed:  05/19/2015 9:45:59 AM By: Montey Hora Entered By: Montey Hora on 05/19/2015 09:45:58 Rick Gilmore, Rick Gilmore ([id number]) -------------------------------------------------------------------------------- Pain Assessment Details Patient Name: Rick Gilmore Date of Service: 05/19/2015 8:45 AM Medical Record Number: [id number] Patient Account Number: [id number] Date of Birth/Sex: 12-Feb-1941 (74 y.o. Male) Treating RN: Baruch Gouty, RN, BSN, [place] Primary Care Physician: Deborra Medina Other Clinician: Referring Physician: Deborra Medina Treating Physician/Extender: [name] in Treatment: 9 Active Problems Location of Pain Severity and Description of Pain Patient Has Paino No Site Locations Pain Management and Medication Current Pain Management: Electronic Signature(s) Signed: 05/19/2015 3:19:52 PM By: Regan Lemming BSN, RN Entered By: Regan Lemming on 05/19/2015 08:47:41 Rick Gilmore ([id number]) -------------------------------------------------------------------------------- Patient/Caregiver Education Details Patient Name: Rick Gilmore Date of Service: 05/19/2015 8:45 AM Medical Record Number: [id number] Patient Account Number: [id number] Date of Birth/Gender: 02-May-1941 (74 y.o. Male) Treating RN: Baruch Gouty, RN, BSN, [place] Primary Care Physician: Deborra Medina Other Clinician: Referring Physician: Deborra Medina Treating Physician/Extender: [name] in Treatment: 9 Education Assessment Education Provided To: Patient Education Topics Provided Basic Hygiene: Methods: Explain/Verbal Responses: State content correctly Wound/Skin Impairment: Handouts: Caring for Your Ulcer Methods: Explain/Verbal Responses: State content correctly Electronic Signature(s) Signed: 05/19/2015 3:19:52 PM By: Regan Lemming BSN, RN Entered By: Regan Lemming on 05/19/2015 09:35:59 Rick Gilmore ([id number]) -------------------------------------------------------------------------------- Wound Assessment  Details Patient Name: Rick Gilmore Date of Service: 05/19/2015 8:45 AM Medical Record Number: [id number] Patient Account Number: [id number] Date of Birth/Sex: Apr 04, 1941 (74 y.o. Male) Treating RN: Baruch Gouty, RN, BSN, [place] Primary Care Physician: Deborra Medina Other Clinician: Referring Physician: Deborra Medina Treating Physician/Extender: [name] in Treatment: 9 Wound Status Wound Number: 1 Primary Trauma, Other Etiology: Wound Location: Left Lower Leg Wound Open Wounding Event: Trauma Status: Date Acquired: 03/04/2015 Comorbid Anemia, Chronic Obstructive Pulmonary Weeks Of Treatment: 9 History: Disease (COPD), Sleep Apnea, Clustered Wound: No Arrhythmia, Congestive Heart Failure, Coronary Artery Disease, Hypertension, Myocardial Infarction, Gout, Received Radiation Photos Photo Uploaded By: Regan Lemming on 05/19/2015 15:16:15 Wound Measurements Length: (cm) 0.8 Width: (cm) 0.8 Depth: (cm) 0.2 Area: (cm) 0.503 Volume: (cm) 0.101 % Reduction in Area: 33.3% % Reduction in Volume: 33.1% Epithelialization: Small (1-33%) Tunneling: No Undermining: No Wound Description Full Thickness Without Exposed Foul Odor Aft Classification: Support Structures Wound Margin: Distinct, outline attached Exudate Medium Amount: Exudate Type: Serosanguineous Exudate Color: red, brown Lesure, Saheed ([id number]) er Cleansing: No Wound Bed Granulation Amount: Large (67-100%) Exposed Structure Granulation Quality: Red Fascia Exposed: No Necrotic Amount: Small (1-33%) Fat Layer Exposed: No Necrotic Quality: Adherent Slough Tendon Exposed: No Muscle Exposed: No Joint Exposed: No Bone Exposed: No Limited to Skin Breakdown Periwound Skin Texture Texture Color No Abnormalities Noted: No No Abnormalities Noted: No Callus: No Erythema: No Crepitus: No Hemosiderin Staining: Yes Excoriation: No Temperature / Pain Fluctuance: No Temperature: No Abnormality Friable:  No Induration: Yes Localized Edema: Yes Rash: No Scarring: No Moisture No Abnormalities Noted: Yes Wound Preparation Ulcer Cleansing: Rinsed/Irrigated with Saline Topical Anesthetic Applied: Other: 4% lidocaine cream, Treatment Notes Wound #1 (Left Lower Leg) 1. Cleansed with: Clean wound with Normal Saline 3. Peri-wound Care: Skin Prep 4. Dressing Applied: Iodoform packing Gauze 5. Secondary Dressing Applied Dry Gauze 7. Secured with Recruitment consultant) Signed: 05/19/2015 3:19:52 PM By: Regan Lemming BSN,  RN Entered By: Regan Lemming on 05/19/2015 08:50:16 Rick Gilmore, Rick Gilmore ([id number]) Rick Gilmore, Rick Gilmore ([id number]) -------------------------------------------------------------------------------- Wound Assessment Details Patient Name: Rick Gilmore, Rick Gilmore Date of Service: 05/19/2015 8:45 AM Medical Record Number: [id number] Patient Account Number: [id number] Date of Birth/Sex: [birth year] (74 y.o. Male) Treating RN: Baruch Gouty, RN, BSN, [place] Primary Care Physician: Deborra Medina Other Clinician: Referring Physician: Deborra Medina Treating Physician/Extender: [name] in Treatment: 9 Wound Status Wound Number: 2 Primary Trauma, Other Etiology: Wound Location: Left Lower Leg - Anterior Wound Open Wounding Event: Shear/Friction Status: Date Acquired: 05/16/2015 Comorbid Anemia, Chronic Obstructive Pulmonary Weeks Of Treatment: 0 History: Disease (COPD), Sleep Apnea, Clustered Wound: No Arrhythmia, Congestive Heart Failure, Coronary Artery Disease, Hypertension, Myocardial Infarction, Gout, Received Radiation Photos Photo Uploaded By: Regan Lemming on 05/19/2015 15:16:16 Wound Measurements Length: (cm) 6.6 Width: (cm) 2.4 Depth: (cm) 0.1 Area: (cm) 12.441 Volume: (cm) 1.244 % Reduction in Area: 0% % Reduction in Volume: 0% Epithelialization: Medium (34-66%) Tunneling: No Undermining: No Wound Description Classification: Partial Thickness Wound Margin:  Distinct, outline attached Foul Odor After Cleansing: No Wound Bed Granulation Amount: Large (67-100%) Exposed Structure Granulation Quality: Red, Pink Fascia Exposed: No Necrotic Amount: None Present (0%) Fat Layer Exposed: No Tendon Exposed: No Rick Gilmore, Rick Gilmore ([id number]) Muscle Exposed: No Joint Exposed: No Bone Exposed: No Limited to Skin Breakdown Periwound Skin Texture Texture Color No Abnormalities Noted: No No Abnormalities Noted: No Callus: No Atrophie Blanche: No Crepitus: No Cyanosis: No Excoriation: No Ecchymosis: No Fluctuance: No Erythema: No Friable: No Hemosiderin Staining: No Induration: No Mottled: No Localized Edema: Yes Pallor: No Rash: No Rubor: No Scarring: No Temperature / Pain Moisture Temperature: No Abnormality No Abnormalities Noted: No Dry / Scaly: No Maceration: No Moist: Yes Wound Preparation Ulcer Cleansing: Rinsed/Irrigated with Saline Topical Anesthetic Applied: Other: lidocaine 4%, Treatment Notes Wound #2 (Left, Anterior Lower Leg) 1. Cleansed with: Clean wound with Normal Saline 3. Peri-wound Care: Skin Prep 4. Dressing Applied: Prisma Ag 5. Secondary Dressing Applied Dry Gauze Non-Adherent pad 7. Secured with Recruitment consultant) Signed: 05/19/2015 3:19:52 PM By: Regan Lemming BSN, RN Entered By: Regan Lemming on 05/19/2015 08:57:43 Rick Gilmore ([id number]) -------------------------------------------------------------------------------- Vitals Details Patient Name: Rick Gilmore Date of Service: 05/19/2015 8:45 AM Medical Record Number: [id number] Patient Account Number: [id number] Date of Birth/Sex: 08/30/1941 (74 y.o. Male) Treating RN: Baruch Gouty, RN, BSN, [place] Primary Care Physician: Deborra Medina Other Clinician: Referring Physician: Deborra Medina Treating Physician/Extender: [name] in Treatment: 9 Vital Signs Time Taken: 08:47 Temperature (F): 97.8 Height (in): 75 Pulse (bpm):  68 Weight (lbs): 252 Respiratory Rate (breaths/min): 16 Body Mass Index (BMI): 31.5 Blood Pressure (mmHg): 92/40 Reference Range: 80 - 120 mg / dl Electronic Signature(s) Signed: 05/19/2015 3:19:52 PM By: Regan Lemming BSN, RN Entered By: Regan Lemming on 05/19/2015 08:48:10

## 2015-05-19 NOTE — Progress Notes (Addendum)
CIEL, ARRENDONDO ([id number]) Visit Report for 05/19/2015 Chief Complaint Document Details Patient Name: Rick Gilmore, Rick Gilmore Date of Service: 05/19/2015 8:45 AM Medical Record Number: [id number] Patient Account Number: [id number] Date of Birth/Sex: [birth year] (74 y.o. Male) Treating RN: Primary Care Physician: Deborra Medina Other Clinician: Referring Physician: Deborra Medina Treating Physician/Extender: [name] in Treatment: 9 Information Obtained from: Patient Chief Complaint Patient presents to the wound care center for a consult due non healing wound 74 year old gentleman comes with a history of having a injury to his left lower extremity on 03/04/15,which was seen in the ER about 10 days ago. 05/19/2015 -- he hit the same left lower extremity against a wooden plank and abraded this a few days ago. He has sustained a new superficial wound very close to the previous one. Electronic Signature(s) Signed: 05/19/2015 12:20:11 PM By: [name] MD, FACS Entered By: [name] on 05/19/2015 09:30:43 Rick Gilmore, Rick Gilmore ([id number]) -------------------------------------------------------------------------------- HPI Details Patient Name: Rick Gilmore Date of Service: 05/19/2015 8:45 AM Medical Record Number: [id number] Patient Account Number: [id number] Date of Birth/Sex: [birth year] (74 y.o. Male) Treating RN: Primary Care Physician: Deborra Medina Other Clinician: Referring Physician: Deborra Medina Treating Physician/Extender: [name] in Treatment: 9 History of Present Illness Location: left lower extremity Quality: Patient reports experiencing a dull pain to affected area(s). Severity: Patient states wound (s) are getting better. Duration: Patient has had the wound for < 2 weeks prior to presenting for treatment Timing: Pain in wound is Intermittent (comes and goes Context: The wound occurred when the patient after he had a fall and hurt himself. Modifying Factors:  Patient must stand for long periods while working Associated Signs and Symptoms: Patient reports presence of swelling HPI Description: 74 year old gentleman who has a past medical history significant for gout, edema of the lower limbs, anemia, hypertension, acid reflux, depression, COPD, coronary artery disease and history of sleep apnea. He has had a bypass surgery in 1988 and has had prostate cancer with radiation and seed implants in 2002. He is also known to have a pacemaker and defibrillator placed in 2014. he has been seen in the ER on 3 different occasions since the fall and they have been applying a dry dressing and some labs were done the last time around. No x-rays or ultrasound has been done. The patient is on an anticoagulant and this has not been held at any stage. During his ER notes from 03/10/2015 is noted that he had a wound on the left lower extremity which she's had for about 10 days now. The patient was noted to have extensive ecchymosis and tenderness and a laceration to the left lower extremity. Note is made of the fact that he had no signs of cellulitis and his labs were normal. He was given symptomatic treatment and asked to follow-up in the wound center. 03/24/2015 -- he has been doing fine and his dressing has been done by his family members and is at no issues. His treatment the blood thinner continuous. He will be going out of town this week and will be able to see as next week on Tuesday. 05/12/2015 -- it has been about 8 weeks since we are treating him with conservative therapy and is doing pretty well though there is some depth to the area and he continues to have to pack it. 05/19/2015 -- he inadvertently hit his left lower extremity against a wooden plank and had a fresh abrasion. Other than that he's been doing fine. Electronic Signature(s) Signed: 05/19/2015 12:20:11 PM  By: [name] MD, FACS Entered By: [name] on 05/19/2015 09:31:10 Rick Gilmore, Rick Gilmore  ([id number]) -------------------------------------------------------------------------------- Physical Exam Details Patient Name: Rick Gilmore Date of Service: 05/19/2015 8:45 AM Medical Record Number: [id number] Patient Account Number: [id number] Date of Birth/Sex: 1941-04-01 (74 y.o. Male) Treating RN: Primary Care Physician: Deborra Medina Other Clinician: Referring Physician: Deborra Medina Treating Physician/Extender: [name] in Treatment: 9 Constitutional . Pulse regular. Respirations normal and unlabored. Afebrile. . Eyes Nonicteric. Reactive to light. Ears, Nose, Mouth, and Throat Lips, teeth, and gums WNL.Marland Kitchen Moist mucosa without lesions . Neck supple and nontender. No palpable supraclavicular or cervical adenopathy. Normal sized without goiter. Respiratory WNL. No retractions.. Cardiovascular Pedal Pulses WNL. No clubbing, cyanosis or edema. Musculoskeletal Adexa without tenderness or enlargement.. Digits and nails w/o clubbing, cyanosis, infection, petechiae, ischemia, or inflammatory conditions.. Integumentary (Hair, Skin) The original wound is healing nicely from the superior lateral part but has a fairly deep tunnel in the region of the 7:00 position and this measured up to about 1.5 cm.. No crepitus or fluctuance. No peri-wound warmth or erythema. No masses.Marland Kitchen Psychiatric Judgement and insight Intact.. No evidence of depression, anxiety, or agitation.. Electronic Signature(s) Signed: 05/19/2015 12:20:11 PM By: [name] MD, FACS Entered By: [name] on 05/19/2015 09:32:44 Rick Gilmore ([id number]) -------------------------------------------------------------------------------- Physician Orders Details Patient Name: Rick Gilmore Date of Service: 05/19/2015 8:45 AM Medical Record Number: [id number] Patient Account Number: [id number] Date of Birth/Sex: [birth year] (74 y.o. Male) Treating RN: Montey Hora Primary Care Physician: Deborra Medina Other Clinician: Referring Physician: Deborra Medina Treating Physician/Extender: [name] in Treatment: 9 Verbal / Phone Orders: Yes Clinician: Montey Hora Read Back and Verified: Yes Diagnosis Coding ICD-10 Coding Code Description [phone] Non-pressure chronic ulcer of left calf with fat layer exposed S81.812A Laceration without foreign body, left lower leg, initial encounter S81.801A Unspecified open wound, right lower leg, initial encounter Wound Cleansing Wound #1 Left Lower Leg o Clean wound with Normal Saline. Wound #2 Left,Anterior Lower Leg o Clean wound with Normal Saline. Anesthetic Wound #1 Left Lower Leg o Topical Lidocaine 4% cream applied to wound bed prior to debridement Wound #2 Left,Anterior Lower Leg o Topical Lidocaine 4% cream applied to wound bed prior to debridement Primary Wound Dressing Wound #1 Left Lower Leg o Iodoform packing Gauze Wound #2 Left,Anterior Lower Leg o Prisma Ag Secondary Dressing Wound #1 Left Lower Leg o Non-adherent pad Wound #2 Left,Anterior Lower Leg o Non-adherent pad Dressing Change Frequency Wound #1 Left Lower Leg Strieter, Dennard ([id number]) o Change dressing every other day. Wound #2 Left,Anterior Lower Leg o Change dressing every week Follow-up Appointments Wound #1 Left Lower Leg o Return Appointment in 1 week. Additional Orders / Instructions Wound #1 Left Lower Leg o Increase protein intake. o Activity as tolerated Wound #2 Left,Anterior Lower Leg o Increase protein intake. o Activity as tolerated Electronic Signature(s) Signed: 05/19/2015 9:54:23 AM By: Montey Hora Signed: 05/19/2015 12:20:11 PM By: [name] MD, FACS Entered By: Montey Hora on 05/19/2015 09:54:23 Rick Gilmore, Rick Gilmore ([id number]) -------------------------------------------------------------------------------- Problem List Details Patient Name: Rick Gilmore Date of Service: 05/19/2015  8:45 AM Medical Record Number: [id number] Patient Account Number: [id number] Date of Birth/Sex: 1941/03/11 (74 y.o. Male) Treating RN: Primary Care Physician: Deborra Medina Other Clinician: Referring Physician: Deborra Medina Treating Physician/Extender: [name] in Treatment: 9 Active Problems ICD-10 Encounter Code Description Active Date Diagnosis [phone] Non-pressure chronic ulcer of left calf with fat layer 03/17/2015 Yes exposed S81.812A Laceration without foreign body, left lower leg, initial 03/17/2015  Yes encounter S81.801A Unspecified open wound, right lower leg, initial encounter 05/19/2015 Yes Inactive Problems Resolved Problems Electronic Signature(s) Signed: 05/19/2015 12:20:11 PM By: [name] MD, FACS Entered By: [name] on 05/19/2015 09:29:56 Rick Gilmore ([id number]) -------------------------------------------------------------------------------- Progress Note Details Patient Name: Rick Gilmore Date of Service: 05/19/2015 8:45 AM Medical Record Number: [id number] Patient Account Number: [id number] Date of Birth/Sex: [birth year] (74 y.o. Male) Treating RN: Primary Care Physician: Deborra Medina Other Clinician: Referring Physician: Deborra Medina Treating Physician/Extender: [name] in Treatment: 9 Subjective Chief Complaint Information obtained from Patient Patient presents to the wound care center for a consult due non healing wound 74 year old gentleman comes with a history of having a injury to his left lower extremity on 03/04/15,which was seen in the ER about 10 days ago. 05/19/2015 -- he hit the same left lower extremity against a wooden plank and abraded this a few days ago. He has sustained a new superficial wound very close to the previous one. History of Present Illness (HPI) The following HPI elements were documented for the patient's wound: Location: left lower extremity Quality: Patient reports experiencing a dull pain  to affected area(s). Severity: Patient states wound (s) are getting better. Duration: Patient has had the wound for < 2 weeks prior to presenting for treatment Timing: Pain in wound is Intermittent (comes and goes Context: The wound occurred when the patient after he had a fall and hurt himself. Modifying Factors: Patient must stand for long periods while working Associated Signs and Symptoms: Patient reports presence of swelling 74 year old gentleman who has a past medical history significant for gout, edema of the lower limbs, anemia, hypertension, acid reflux, depression, COPD, coronary artery disease and history of sleep apnea. He has had a bypass surgery in 1988 and has had prostate cancer with radiation and seed implants in 2002. He is also known to have a pacemaker and defibrillator placed in 2014. he has been seen in the ER on 3 different occasions since the fall and they have been applying a dry dressing and some labs were done the last time around. No x-rays or ultrasound has been done. The patient is on an anticoagulant and this has not been held at any stage. During his ER notes from 03/10/2015 is noted that he had a wound on the left lower extremity which she's had for about 10 days now. The patient was noted to have extensive ecchymosis and tenderness and a laceration to the left lower extremity. Note is made of the fact that he had no signs of cellulitis and his labs were normal. He was given symptomatic treatment and asked to follow-up in the wound center. 03/24/2015 -- he has been doing fine and his dressing has been done by his family members and is at no issues. His treatment the blood thinner continuous. He will be going out of town this week and will be able to see as next week on Tuesday. 05/12/2015 -- it has been about 8 weeks since we are treating him with conservative therapy and is doing pretty well though there is some depth to the area and he continues to have to  pack it. 05/19/2015 -- he inadvertently hit his left lower extremity against a wooden plank and had a fresh abrasion. Rick Gilmore, Rick Gilmore ([id number]) Other than that he's been doing fine. Objective Constitutional Pulse regular. Respirations normal and unlabored. Afebrile. Vitals Time Taken: 8:47 AM, Height: 75 in, Weight: 252 lbs, BMI: 31.5, Temperature: 97.8 F, Pulse: 68 bpm, Respiratory Rate: 16  breaths/min, Blood Pressure: 92/40 mmHg. Eyes Nonicteric. Reactive to light. Ears, Nose, Mouth, and Throat Lips, teeth, and gums WNL.Marland Kitchen Moist mucosa without lesions . Neck supple and nontender. No palpable supraclavicular or cervical adenopathy. Normal sized without goiter. Respiratory WNL. No retractions.. Cardiovascular Pedal Pulses WNL. No clubbing, cyanosis or edema. Musculoskeletal Adexa without tenderness or enlargement.. Digits and nails w/o clubbing, cyanosis, infection, petechiae, ischemia, or inflammatory conditions.Marland Kitchen Psychiatric Judgement and insight Intact.. No evidence of depression, anxiety, or agitation.. Integumentary (Hair, Skin) The original wound is healing nicely from the superior lateral part but has a fairly deep tunnel in the region of the 7:00 position and this measured up to about 1.5 cm.. No crepitus or fluctuance. No peri-wound warmth or erythema. No masses.. Wound #1 status is Open. Original cause of wound was Trauma. The wound is located on the Left Lower Leg. The wound measures 0.8cm length x 0.8cm width x 0.2cm depth; 0.503cm^2 area and 0.101cm^3 volume. The wound is limited to skin breakdown. There is no tunneling or undermining noted. There is a medium amount of serosanguineous drainage noted. The wound margin is distinct with the outline attached to the wound base. There is large (67-100%) red granulation within the wound bed. There is a small Alarie, Ladarrious ([id number]) (1-33%) amount of necrotic tissue within the wound bed including Adherent Slough. The  periwound skin appearance had no abnormalities noted for moisture. The periwound skin appearance exhibited: Induration, Localized Edema, Hemosiderin Staining. The periwound skin appearance did not exhibit: Callus, Crepitus, Excoriation, Fluctuance, Friable, Rash, Scarring, Erythema. Periwound temperature was noted as No Abnormality. Wound #2 status is Open. Original cause of wound was Shear/Friction. The wound is located on the Left,Anterior Lower Leg. The wound measures 6.6cm length x 2.4cm width x 0.1cm depth; 12.441cm^2 area and 1.244cm^3 volume. The wound is limited to skin breakdown. There is no tunneling or undermining noted. The wound margin is distinct with the outline attached to the wound base. There is large (67-100%) red, pink granulation within the wound bed. There is no necrotic tissue within the wound bed. The periwound skin appearance exhibited: Localized Edema, Moist. The periwound skin appearance did not exhibit: Callus, Crepitus, Excoriation, Fluctuance, Friable, Induration, Rash, Scarring, Dry/Scaly, Maceration, Atrophie Blanche, Cyanosis, Ecchymosis, Hemosiderin Staining, Mottled, Pallor, Rubor, Erythema. Periwound temperature was noted as No Abnormality. The original wound is healing nicely from the superior lateral part but has a fairly deep tunnel in the region of the 7:00 position and this measured up to about 1.5 cm. He has a superficial abrasion just medial to his original wound and this is measured and it is clean without any cellulitis. Assessment Active Problems ICD-10 L97.222 - Non-pressure chronic ulcer of left calf with fat layer exposed S81.812A - Laceration without foreign body, left lower leg, initial encounter S81.801A - Unspecified open wound, right lower leg, initial encounter We will treat the original wound as before with iodoform packing. This will be changed on alternate days. The new wound will be treated with Prisma and then a nonstick adherent  pad over this and we will leave this intact for the next week. We are awaiting insurance clearance regarding his skin substitute. Plan Wound Cleansing: Rick Gilmore, Rick Gilmore ([id number]) Wound #1 Left Lower Leg: Clean wound with Normal Saline. Wound #2 Left,Anterior Lower Leg: Clean wound with Normal Saline. Anesthetic: Wound #1 Left Lower Leg: Topical Lidocaine 4% cream applied to wound bed prior to debridement Wound #2 Left,Anterior Lower Leg: Topical Lidocaine 4% cream applied to wound bed prior to  debridement Primary Wound Dressing: Wound #1 Left Lower Leg: Iodoform packing Gauze Wound #2 Left,Anterior Lower Leg: Prisma Ag Secondary Dressing: Wound #1 Left Lower Leg: Non-adherent pad Wound #2 Left,Anterior Lower Leg: Non-adherent pad Dressing Change Frequency: Wound #1 Left Lower Leg: Change dressing every other day. Wound #2 Left,Anterior Lower Leg: Change dressing every week Follow-up Appointments: Wound #1 Left Lower Leg: Return Appointment in 1 week. Additional Orders / Instructions: Wound #1 Left Lower Leg: Increase protein intake. Activity as tolerated Wound #2 Left,Anterior Lower Leg: Increase protein intake. Activity as tolerated We will treat the original wound as before with iodoform packing. This will be changed on alternate days. The new wound will be treated with Prisma and then a nonstick adherent pad over this and we will leave this intact for the next week. We are awaiting insurance clearance regarding his skin substitute. Electronic Signature(s) Rick Gilmore, Rick Gilmore ([id number]) Signed: 05/19/2015 4:43:13 PM By: [name] MD, FACS Previous Signature: 05/19/2015 12:20:11 PM Version By: [name] MD, FACS Entered By: [name] on 05/19/2015 16:41:55 Rick Gilmore ([id number]) -------------------------------------------------------------------------------- SuperBill Details Patient Name: Rick Gilmore Date of Service: 05/19/2015 Medical Record  Number: [id number] Patient Account Number: [id number] Date of Birth/Sex: [birth year] (74 y.o. Male) Treating RN: Primary Care Physician: Deborra Medina Other Clinician: Referring Physician: Deborra Medina Treating Physician/Extender: [name] in Treatment: 9 Diagnosis Coding ICD-10 Codes Code Description [phone] Non-pressure chronic ulcer of left calf with fat layer exposed S81.812A Laceration without foreign body, left lower leg, initial encounter S81.801A Unspecified open wound, right lower leg, initial encounter Facility Procedures CPT4 Code: [id number] Description: 99213 - WOUND CARE VISIT-LEV 3 EST PT Modifier: Quantity: 1 Physician Procedures CPT4 Code Description: [id number] - WC PHYS LEVEL 3 - EST PT ICD-10 Description Diagnosis L97.222 Non-pressure chronic ulcer of left calf with fat lay S81.801A Unspecified open wound, right lower leg, initial enc S81.812A Laceration without foreign  body, left lower leg, ini Modifier: 25 er exposed ounter tial encounter Quantity: 1 Electronic Signature(s) Signed: 05/19/2015 9:55:28 AM By: Montey Hora Signed: 05/19/2015 12:20:11 PM By: [name] MD, FACS Entered By: Montey Hora on 05/19/2015 09:55:28

## 2015-05-20 ENCOUNTER — Encounter: Payer: Self-pay | Admitting: Internal Medicine

## 2015-05-20 ENCOUNTER — Ambulatory Visit (INDEPENDENT_AMBULATORY_CARE_PROVIDER_SITE_OTHER): Payer: Medicare Other | Admitting: Internal Medicine

## 2015-05-20 VITALS — BP 99/60 | HR 89 | Temp 98.1°F | Ht 75.0 in | Wt 256.0 lb

## 2015-05-20 DIAGNOSIS — G4733 Obstructive sleep apnea (adult) (pediatric): Secondary | ICD-10-CM | POA: Diagnosis not present

## 2015-05-20 DIAGNOSIS — R0600 Dyspnea, unspecified: Secondary | ICD-10-CM | POA: Diagnosis not present

## 2015-05-20 DIAGNOSIS — J90 Pleural effusion, not elsewhere classified: Secondary | ICD-10-CM

## 2015-05-20 NOTE — Assessment & Plan Note (Signed)
Factorial: Obstructive lung disease, heart failure (ejection fraction 30-35%), deconditioning, advanced age, fluid overload.  Plan: -Continue follow-up with cardiology, continue with diabetics and BP control -Optimization of CHF will be paramount in controlling dyspnea and pleural effusion.

## 2015-05-20 NOTE — Progress Notes (Signed)
MRN# [id number] Rick Gilmore Oct 08, 1941   CC: Chief Complaint  Patient presents with  . Follow-up    Pt here f/u he is no longer seeing Dr. Raul Del. Pt says he has had a hard time getting call back or updates on procedure in a timely fashion. Pt states breathing doing well.      Brief History: 10/29/14 Patient is a pleasant 74 year old male referred by Dr. Derrel Nip for second opinion about bilateral pleural effusion and shortness of breath. Brief history - history of ischemic cardiomyopathy, CHF NYFC III, OSA, BiV ICD, atrial fibrillation, Iron Def Anemia, Mild thrombocytopenia, prostate cancer (s\p seed implants and XRT) and obesity. He was referred by his cardiologist to Dr. Raul Del [facility]) for evaluation of dyspnea. Bilateral pleural effusions were found on chestCT, moderate sized, R > L, with R lung atelectasis. No thoracentesis was ordered or planned by Dr. Raul Del. He is now wearing supplemental oxygen at night (2L). The initiation of Imdur at night has made a considerable improvement in his dyspnea,EF is still 35% by repeat ECHO,taking Eliquis for mitigation of CVA risk. Patient has no history of chest pain, pleurisy, calf pain or cough, no hemoptysis, no syncope, he doesn't a history of initial fibrillation with a decreased ejection fraction on anticoagulation. He also has a history of ulcerative sleep apnea, on CPAP, and is compliant with his CPAP machine. Review of records showed he saw Dr. Raul Del on 09/24/2014 for shortness of breath, Dr. Raul Del was working him up for possible pulmonary hypertension due to severely elevated RVSP (70.2) on an echo in July 2015, workup would include weight loss, continue CPAP, overnight oximetry, trial of anoro ellipta/albuterol, pulmonary rehabilitation, which strict salt and fluids, possible evaluation for right heart cardiac catheterization and follow-up in February 2016.  Patient was not satisfied with the above plan outlined by  Dr. Raul Del, according to patient it was not clearly stated, and even though his shortness of breath was moderately improving, he did not understand why he was having bilateral pleural effusions. Currently he can walk about 30 yards, still has 1-2+ pitting edema, but wears compression stockings. Patient also follows with cardiology closely, [facility] (Dr. Nehemiah Massed). He denies any fevers, chills, night sweats, rapid weight loss. Plan - poor cardiac function, no thoracentesis, continue follow up with Dr. Raul Del.    Events since last clinic visit: Patient here for follow up visit. States that he would like to transition care to [facility]. Lost 40lbs since last visit, with exercise, dietary control; also got ill in 11/2014, hospitalized for 3 days, and lost weight at that time, admitted for respiratory distress secondary to [facility].  Has a hx of sleep apnea, wearing cpap nightly. Also, since the weight loss, he has no longer required supplemental at night or with exertion.  Patient states that he recently completed an autopap trial, 12cmH20 is optimal. [facility] is the DME.      Dr. Raul Del Visit Feb 01/2015 History of Present Illness: Rick Gilmore is a 74 y.o. male who presents to clinic for follow up visit. He is saying today is the best he felt in 10 years. He lost 40 pounds intentionally. He has no chest pain, pleurisy, ectopy, syncope, wheezing or coughing. He able to keep up with his chores.   Physical Exam: BP 96/59 mmHg  Pulse 69  Temp(Src) 37.6 C (99.6 F) (Tympanic)  Ht 188 cm (6\' 2" )  Wt 112.401 kg (247 lb 12.8 oz)  BMI 31.80 kg/m2  SpO2 96% 112.401  kg (247 lb 12.8 oz) 96% General: NAD. Able to speak in complete sentences without cough or dyspnea HEENT: Normocephalic, nontraumatic. Extraocular movements intact NECK: Supple. No JVD, nodes, thyromegaly, no stridor CV: RRR no murmurs, gallops, rubs PULM: Normal respiratory effort, Clear to auscultation bilaterally  without wheezing or crackles EXTREMITIES: No significant edema, cyanosis or Homans'signs SKIN: Fair turgor. No rashes LYMPHATIC: No nodes NEURO: No gross deficits PSYCH: Appropriate affect, alert   Impression: 1. Ischemic cardiomyopathy, 30-35%. No florid failure 2. Mild COPD/small airway disease. Stable. 3. Obstructive sleep apnea on CPAP. Good response 4. Obesity. 5. Beside the the above problems he also has severely elevated RVSP, ? Pulmonary hypertension (cardiac/pulmonary causes) 6. Known history of 1.1 cm paratracheal node. Does not appears to have change in size  Plan: -weight loss -continue cpap -recheck overnight oximetry -anoro ellipta/albuterol -pulmonary rehab -watch salt and fluids -hold on right cath -follow up in 18 weeks with cxr, spirometry  PFTS  And 6MWT 09/04/2014 SPIROMETRY: FVC was 2.16 liters, 48% of predicted/Post 2.31, 51%, 7% change FEV1 was 1.25, 35% of predicted/Post 1.62, 46%, 30% change  FEV1 ratio was 58/Post 70 FEF 25-75% liters per second was 12% of predicted/Post 29%, 143% change *SVN given with 2.5 mg Albuterol for Post Spirometry  LUNG VOLUMES: TLC was 59% of predicted RV was 71% of predicted  DIFFUSION CAPACITY: DLCO was 65% of predicted DLCO/VA was 111% of predicted  FLOW VOLUME LOOP: Exp flow volume loop is mildly delayed  Impression Severe obstruction with BD response Lung volumes is restricted moderately Dlco is moderately decreased but corrects for VA. ?? Extrathoracic restriction  *Compared to Previous Study, 10/11, with exception of DLCO, numbers have significantly declined.  Pulse oximetry on room air is 95% @ Rest, pt walked 246 ft. With Activity, pt maintained SpO2 92% or greater, pt did appear SOB, hence why pt did not make a third lap, felt need to stop.    Medication:   Current Outpatient Rx  Name  Route  Sig  Dispense  Refill  . allopurinol (ZYLOPRIM) 300 MG tablet   Oral   Take 1 tablet (300 mg total) by  mouth daily.   90 tablet   3   . colchicine 0.6 MG tablet   Oral   Take 0.6 mg by mouth daily as needed.          . cyanocobalamin (,VITAMIN B-12,) 1000 MCG/ML injection   Intramuscular   Inject 1,000 mcg into the muscle every 30 (thirty) days.         Marland Kitchen doxazosin (CARDURA) 4 MG tablet   Oral   Take 4 mg by mouth at bedtime.         Marland Kitchen ELIQUIS 5 MG TABS tablet      TAKE 1 TABLET TWICE A DAY   180 tablet   1   . escitalopram (LEXAPRO) 10 MG tablet      TAKE 1 TABLET DAILY   90 tablet   0   . esomeprazole (NEXIUM) 40 MG capsule   Oral   Take 40 mg by mouth daily at 12 noon.         . furosemide (LASIX) 80 MG tablet      TAKE 1 TABLET DAILY   90 tablet   1   . isosorbide mononitrate (IMDUR) 30 MG 24 hr tablet   Oral   Take 30 mg by mouth daily.      11   . lisinopril (PRINIVIL,ZESTRIL) 5 MG tablet  Oral   Take 2.5 mg by mouth 2 (two) times daily.         . metoprolol succinate (TOPROL-XL) 50 MG 24 hr tablet   Oral   Take 50 mg by mouth daily. Take with or immediately following a meal.         . potassium chloride SA (K-DUR,KLOR-CON) 20 MEQ tablet   Oral   Take 1 tablet (20 mEq total) by mouth daily.   90 tablet   2   . spironolactone (ALDACTONE) 25 MG tablet   Oral   Take 1 tablet (25 mg total) by mouth daily.   90 tablet   2      Review of Systems: Gen:  Denies  fever, sweats, chills HEENT: Denies blurred vision, double vision, ear pain, eye pain, hearing loss, nose bleeds, sore throat Cvc:  No dizziness, chest pain or heaviness Resp:   No cough,no worsening doe Gi: Denies swallowing difficulty, stomach pain, nausea or vomiting, diarrhea, constipation, bowel incontinence Gu:  Denies bladder incontinence, burning urine Ext:   No Joint pain, stiffness or swelling Skin: No skin rash, easy bruising or bleeding or hives Endoc:  No polyuria, polydipsia , polyphagia or weight change Other:  All other systems negative  Allergies:  No  known allergies  Physical Examination:  VS: BP 99/60 mmHg  Pulse 89  Temp(Src) 98.1 F (36.7 C) (Oral)  Ht 6\' 3"  (1.905 m)  Wt 256 lb (116.121 kg)  BMI 32.00 kg/m2  SpO2 97%  General Appearance: No distress  HEENT: PERRLA, no ptosis, no other lesions noticed Pulmonary:normal breath sounds., diaphragmatic excursion normal.No wheezing, No rales   Cardiovascular:  Normal S1,S2.  No m/r/g.     Abdomen:Exam: Benign, Soft, non-tender, No masses  Skin:   warm, no rashes, no ecchymosis  Extremities: normal, no cyanosis, clubbing, warm with normal capillary refill.  Trace pedal edema      Assessment and Plan:73 yo Male with PMHx of dCHF, seen in follow up today for bilateral pleural effusion and transition of care.  Pleural effusion, bilateral Multifactorial - CHF, OSA, Cardiomyopathy, poor EF, Afib, elevated RSVP, possible PAH or pHTN  Discussed in detail pathophysiology of these effusions and the multifactorial etiologies associated with them.  Explained to patient in great length and detail that these effusions are most likely related to his severe heart disease, poor ejection fraction, and possible pulmonary hypertension. Pulmonary hypertension and its subclasses were also discussed with the patient including pulmonary arterial hypertension. Explain reasons for not performing a thoracentesis; given his poor cardiac function, and this effusion most is likely transudative related to CHF; draining the effusion will create negative pressure and reoccurrence of the effusion. While a thoracocentesis may provide temporary relief, it is not curative, and likelihood of effusion recurring and becoming more massive is increased with thoracentesis. Reviewed his cardiac echo, his CAT scan of chest with the patient, explained elevated pressures on echo and concern for pulmonary hypertension. Explained and reviewed Dr. Leane Platt note of the patient's bilateral pulmonary effusion and elevated RVSP, and  appropriate plan by Dr. Raul Del. At this time patient is in agreement with no further workup for effusions unless he becomes moderately or severely symptomatic.  Since his last visit he has had moderate improvement in his dyspnea, he not felt the need to purse a RCA, which I am in agreement with today, based on his clinical status.  Plan - supportive care - 2 view chest xray prior to follow up - cont with  CHF regiment which is diuretics and BP management (see med list).     Sleep apnea, obstructive  OSA   Encouraged proper weight management.  Excessive weight may contribute to snoring.  Monitor sedative use.  Discussed driving precautions and its relationship with hypersomnolence.  Discussed operating dangerous equipment and its relationship with hypersomnolence.  Discussed sleep hygiene, and benefits of a fixed sleep waked time.  The importance of getting eight or more hours of sleep discussed with patient.  Discussed limiting the use of the computer and television before bedtime.  Decrease naps during the day, so night time sleep will become enhanced.  Limit caffeine, and sleep deprivation.  HTN, stroke, and heart failure are potential risk factors.      Plan: Continue current prescription for CPAP, 12 cm of order (per the patient) disease. [facility]     Dyspnea Factorial: Obstructive lung disease, heart failure (ejection fraction 30-35%), deconditioning, advanced age, fluid overload.  Plan: -Continue follow-up with cardiology, continue with diabetics and BP control -Optimization of CHF will be paramount in controlling dyspnea and pleural effusion.    Updated Medication List Outpatient Encounter Prescriptions as of 05/20/2015  Medication Sig  . allopurinol (ZYLOPRIM) 300 MG tablet Take 1 tablet (300 mg total) by mouth daily.  . colchicine 0.6 MG tablet Take 0.6 mg by mouth daily as needed.   . cyanocobalamin (,VITAMIN B-12,) 1000 MCG/ML  injection Inject 1,000 mcg into the muscle every 30 (thirty) days.  Marland Kitchen doxazosin (CARDURA) 4 MG tablet Take 4 mg by mouth at bedtime.  Marland Kitchen ELIQUIS 5 MG TABS tablet TAKE 1 TABLET TWICE A DAY  . escitalopram (LEXAPRO) 10 MG tablet TAKE 1 TABLET DAILY  . esomeprazole (NEXIUM) 40 MG capsule Take 40 mg by mouth daily at 12 noon.  . furosemide (LASIX) 80 MG tablet TAKE 1 TABLET DAILY  . isosorbide mononitrate (IMDUR) 30 MG 24 hr tablet Take 30 mg by mouth daily.  Marland Kitchen lisinopril (PRINIVIL,ZESTRIL) 5 MG tablet Take 2.5 mg by mouth 2 (two) times daily.  . metoprolol succinate (TOPROL-XL) 50 MG 24 hr tablet Take 50 mg by mouth daily. Take with or immediately following a meal.  . potassium chloride SA (K-DUR,KLOR-CON) 20 MEQ tablet Take 1 tablet (20 mEq total) by mouth daily.  Marland Kitchen spironolactone (ALDACTONE) 25 MG tablet Take 1 tablet (25 mg total) by mouth daily.  . [DISCONTINUED] albuterol (PROVENTIL HFA;VENTOLIN HFA) 108 (90 BASE) MCG/ACT inhaler Inhale 2 puffs into the lungs every 6 (six) hours as needed for wheezing or shortness of breath. (Patient not taking: Reported on 05/02/2015)  . [DISCONTINUED] HYDROcodone-acetaminophen (NORCO/VICODIN) 5-325 MG per tablet Take 1-2 tablets by mouth every 4 (four) hours as needed for moderate pain.   No facility-administered encounter medications on file as of 05/20/2015.    Orders for this visit: Orders Placed This Encounter  Procedures  . DG Chest 2 View    Standing Status: Future     Number of Occurrences:      Standing Expiration Date: 07/19/2016    Scheduling Instructions:     Patient knows to have done before f/u.    Order Specific Question:  Reason for Exam (SYMPTOM  OR DIAGNOSIS REQUIRED)    Answer:  sob    Order Specific Question:  Preferred imaging location?    Answer:  [facility]    Thank  you for the visitation and for allowing  [facility] to assist in the care of your patient.  Our recommendations are noted above.   Please contact us if we can be of further service.  Vilinda Boehringer, MD [facility] Office Number: [phone]

## 2015-05-20 NOTE — Patient Instructions (Addendum)
Follow up with Dr. Stevenson Clinch in 4 months - pulmonary function test and 6 minute walk test prior to follow up visit.  - cont with low salt diet - cont with with exercise and wt. Loss regiment.  - we will obtain a copy of your last sleep study performed at [facility] - 2 view CXR for chronic pleural effusion prior to follow up visit.

## 2015-05-20 NOTE — Assessment & Plan Note (Signed)
Multifactorial - CHF, OSA, Cardiomyopathy, poor EF, Afib, elevated RSVP, possible PAH or pHTN  Discussed in detail pathophysiology of these effusions and the multifactorial etiologies associated with them.  Explained to patient in great length and detail that these effusions are most likely related to his severe heart disease, poor ejection fraction, and possible pulmonary hypertension. Pulmonary hypertension and its subclasses were also discussed with the patient including pulmonary arterial hypertension. Explain reasons for not performing a thoracentesis; given his poor cardiac function, and this effusion most is likely transudative related to CHF; draining the effusion will create negative pressure and reoccurrence of the effusion. While a thoracocentesis may provide temporary relief, it is not curative, and likelihood of effusion recurring and becoming more massive is increased with thoracentesis. Reviewed his cardiac echo, his CAT scan of chest with the patient, explained elevated pressures on echo and concern for pulmonary hypertension. Explained and reviewed Dr. Leane Platt note of the patient's bilateral pulmonary effusion and elevated RVSP, and appropriate plan by Dr. Raul Del. At this time patient is in agreement with no further workup for effusions unless he becomes moderately or severely symptomatic.  Since his last visit he has had moderate improvement in his dyspnea, he not felt the need to purse a RCA, which I am in agreement with today, based on his clinical status.  Plan - supportive care - 2 view chest xray prior to follow up - cont with CHF regiment which is diuretics and BP management (see med list).

## 2015-05-20 NOTE — Assessment & Plan Note (Signed)
  OSA   Encouraged proper weight management.  Excessive weight may contribute to snoring.  Monitor sedative use.  Discussed driving precautions and its relationship with hypersomnolence.  Discussed operating dangerous equipment and its relationship with hypersomnolence.  Discussed sleep hygiene, and benefits of a fixed sleep waked time.  The importance of getting eight or more hours of sleep discussed with patient.  Discussed limiting the use of the computer and television before bedtime.  Decrease naps during the day, so night time sleep will become enhanced.  Limit caffeine, and sleep deprivation.  HTN, stroke, and heart failure are potential risk factors.      Plan: Continue current prescription for CPAP, 12 cm of order (per the patient) disease. [facility] from [facility].

## 2015-05-21 DIAGNOSIS — D500 Iron deficiency anemia secondary to blood loss (chronic): Secondary | ICD-10-CM | POA: Diagnosis not present

## 2015-05-21 DIAGNOSIS — I482 Chronic atrial fibrillation: Secondary | ICD-10-CM | POA: Diagnosis not present

## 2015-05-21 DIAGNOSIS — R195 Other fecal abnormalities: Secondary | ICD-10-CM | POA: Diagnosis not present

## 2015-05-26 ENCOUNTER — Encounter: Payer: Medicare Other | Admitting: Surgery

## 2015-05-26 DIAGNOSIS — I10 Essential (primary) hypertension: Secondary | ICD-10-CM | POA: Diagnosis not present

## 2015-05-26 DIAGNOSIS — S81812A Laceration without foreign body, left lower leg, initial encounter: Secondary | ICD-10-CM | POA: Diagnosis not present

## 2015-05-26 DIAGNOSIS — S81802A Unspecified open wound, left lower leg, initial encounter: Secondary | ICD-10-CM | POA: Diagnosis not present

## 2015-05-26 DIAGNOSIS — R600 Localized edema: Secondary | ICD-10-CM | POA: Diagnosis not present

## 2015-05-26 DIAGNOSIS — I2510 Atherosclerotic heart disease of native coronary artery without angina pectoris: Secondary | ICD-10-CM | POA: Diagnosis not present

## 2015-05-26 DIAGNOSIS — J449 Chronic obstructive pulmonary disease, unspecified: Secondary | ICD-10-CM | POA: Diagnosis not present

## 2015-05-26 DIAGNOSIS — L97222 Non-pressure chronic ulcer of left calf with fat layer exposed: Secondary | ICD-10-CM | POA: Diagnosis not present

## 2015-05-27 NOTE — Progress Notes (Signed)
ABDULMAJID, SUNN ([id number]) Visit Report for 05/26/2015 Chief Complaint Document Details Patient Name: Rick Gilmore, Rick Gilmore Date of Service: 05/26/2015 8:45 AM Medical Record Number: [id number] Patient Account Number: [id number] Date of Birth/Sex: Apr 03, 1941 (74 y.o. Male) Treating RN: Primary Care Physician: Deborra Medina Other Clinician: Referring Physician: Deborra Medina Treating Physician/Extender: [name] in Treatment: 10 Information Obtained from: Patient Chief Complaint Patient presents to the wound care center for a consult due non healing wound 74 year old gentleman comes with a history of having a injury to his left lower extremity on 03/04/15,which was seen in the ER about 10 days ago. 05/19/2015 -- he hit the same left lower extremity against a wooden plank and abraded this a few days ago. He has sustained a new superficial wound very close to the previous one. Electronic Signature(s) Signed: 05/26/2015 12:15:23 PM By: [name] MD, FACS Entered By: [name] on 05/26/2015 09:52:17 Rick Gilmore, Rick Gilmore ([id number]) -------------------------------------------------------------------------------- HPI Details Patient Name: Rick Gilmore Date of Service: 05/26/2015 8:45 AM Medical Record Number: [id number] Patient Account Number: [id number] Date of Birth/Sex: [birth year] (73 y.o. Male) Treating RN: Primary Care Physician: Deborra Medina Other Clinician: Referring Physician: Deborra Medina Treating Physician/Extender: [name] in Treatment: 10 History of Present Illness Location: left lower extremity Quality: Patient reports experiencing a dull pain to affected area(s). Severity: Patient states wound (s) are getting better. Duration: Patient has had the wound for < 2 weeks prior to presenting for treatment Timing: Pain in wound is Intermittent (comes and goes Context: The wound occurred when the patient after he had a fall and hurt himself. Modifying Factors:  Patient must stand for long periods while working Associated Signs and Symptoms: Patient reports presence of swelling HPI Description: 74 year old gentleman who has a past medical history significant for gout, edema of the lower limbs, anemia, hypertension, acid reflux, depression, COPD, coronary artery disease and history of sleep apnea. He has had a bypass surgery in 1988 and has had prostate cancer with radiation and seed implants in 2002. He is also known to have a pacemaker and defibrillator placed in 2014. he has been seen in the ER on 3 different occasions since the fall and they have been applying a dry dressing and some labs were done the last time around. No x-rays or ultrasound has been done. The patient is on an anticoagulant and this has not been held at any stage. During his ER notes from 03/10/2015 is noted that he had a wound on the left lower extremity which she's had for about 10 days now. The patient was noted to have extensive ecchymosis and tenderness and a laceration to the left lower extremity. Note is made of the fact that he had no signs of cellulitis and his labs were normal. He was given symptomatic treatment and asked to follow-up in the wound center. 03/24/2015 -- he has been doing fine and his dressing has been done by his family members and is at no issues. His treatment the blood thinner continuous. He will be going out of town this week and will be able to see as next week on Tuesday. 05/12/2015 -- it has been about 8 weeks since we are treating him with conservative therapy and is doing pretty well though there is some depth to the area and he continues to have to pack it. 05/19/2015 -- he inadvertently hit his left lower extremity against a wooden plank and had a fresh abrasion. Other than that he's been doing fine. Electronic Signature(s) Signed: 05/26/2015 12:15:23 PM  By: [name] MD, FACS Entered By: [name] on 05/26/2015 09:52:23 Rick Gilmore, Rick Gilmore  ([id number]) -------------------------------------------------------------------------------- Physical Exam Details Patient Name: Rick Gilmore, Rick Gilmore Date of Service: 05/26/2015 8:45 AM Medical Record Number: [id number] Patient Account Number: [id number] Date of Birth/Sex: 01-31-1941 (74 y.o. Male) Treating RN: Primary Care Physician: Deborra Medina Other Clinician: Referring Physician: Deborra Medina Treating Physician/Extender: [name] in Treatment: 10 Constitutional . Pulse regular. Respirations normal and unlabored. Afebrile. . Eyes Nonicteric. Reactive to light. Ears, Nose, Mouth, and Throat Lips, teeth, and gums WNL.Marland Kitchen Moist mucosa without lesions . Neck supple and nontender. No palpable supraclavicular or cervical adenopathy. Normal sized without goiter. Respiratory WNL. No retractions.. Cardiovascular Pedal Pulses WNL. No clubbing, cyanosis or edema. Musculoskeletal Adexa without tenderness or enlargement.. Digits and nails w/o clubbing, cyanosis, infection, petechiae, ischemia, or inflammatory conditions.. Integumentary (Hair, Skin) the wound on his left lower extremity has now completely healed superficially except for the inferior part which has a sinus tract going down to about 2 cm.. No crepitus or fluctuance. No peri-wound warmth or erythema. No masses.Marland Kitchen Psychiatric Judgement and insight Intact.. No evidence of depression, anxiety, or agitation.. Notes The wound which he sustained last week is completely healed. Electronic Signature(s) Signed: 05/26/2015 12:15:23 PM By: [name] MD, FACS Entered By: [name] on 05/26/2015 09:53:34 Rick Gilmore ([id number]) -------------------------------------------------------------------------------- Physician Orders Details Patient Name: Rick Gilmore Date of Service: 05/26/2015 8:45 AM Medical Record Number: [id number] Patient Account Number: [id number] Date of Birth/Sex: 1941/11/07 (74 y.o. Male) Treating RN:  Cornell Barman Primary Care Physician: Deborra Medina Other Clinician: Referring Physician: Deborra Medina Treating Physician/Extender: [name] in Treatment: 10 Verbal / Phone Orders: Yes Clinician: Cornell Barman Read Back and Verified: Yes Diagnosis Coding Wound Cleansing Wound #1 Left Lower Leg o Clean wound with Normal Saline. Anesthetic Wound #1 Left Lower Leg o Topical Lidocaine 4% cream applied to wound bed prior to debridement Primary Wound Dressing Wound #1 Left Lower Leg o Iodoform packing Gauze Secondary Dressing Wound #1 Left Lower Leg o Non-adherent pad Dressing Change Frequency Wound #1 Left Lower Leg o Change dressing every other day. Follow-up Appointments Wound #1 Left Lower Leg o Return Appointment in 1 week. Additional Orders / Instructions Wound #1 Left Lower Leg o Increase protein intake. o Activity as tolerated Electronic Signature(s) Signed: 05/26/2015 12:15:23 PM By: [name] MD, FACS Signed: 05/26/2015 5:13:21 PM By: Gretta Cool RN, BSN, Kim RN, BSN Entered By: Gretta Cool, RN, BSN, Kim on 05/26/2015 09:30:34 Rick Gilmore, Rick Gilmore ([id number]) Rick Gilmore, Rick Gilmore ([id number]) -------------------------------------------------------------------------------- Problem List Details Patient Name: Rick Gilmore, Rick Gilmore Date of Service: 05/26/2015 8:45 AM Medical Record Number: [id number] Patient Account Number: [id number] Date of Birth/Sex: 05/08/1941 (74 y.o. 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 Yes exposed S81.812A Laceration without foreign body, left lower leg, initial 03/17/2015 Yes encounter S81.801A Unspecified open wound, right lower leg, initial encounter 05/19/2015  Yes Inactive Problems Resolved Problems Electronic Signature(s) Signed: 05/26/2015 12:15:23 PM By: [name] MD, FACS Entered By: [name] on 05/26/2015 09:52:09 Rick Gilmore ([id number]) -------------------------------------------------------------------------------- Progress Note Details Patient Name: Rick Gilmore Date of Service: 05/26/2015 8:45 AM Medical Record Number: [id number] Patient Account Number: [id number] Date of Birth/Sex: 08/27/1941 (74 y.o. Male) Treating RN: Primary Care Physician: Deborra Medina Other Clinician: Referring Physician: Deborra Medina Treating Physician/Extender: [name] in Treatment: 10  Subjective Chief Complaint Information obtained from Patient Patient presents to the wound care center for a consult due non healing wound 74 year old gentleman comes with a history of having a injury to his left lower extremity on 03/04/15,which was seen in the ER about 10 days ago. 05/19/2015 -- he hit the same left lower extremity against a wooden plank and abraded this a few days ago. He has sustained a new superficial wound very close to the previous one. History of Present Illness (HPI) The following HPI elements were documented for the patient's wound: Location: left lower extremity Quality: Patient reports experiencing a dull pain to affected area(s). Severity: Patient states wound (s) are getting better. Duration: Patient has had the wound for < 2 weeks prior to presenting for treatment Timing: Pain in wound is Intermittent (comes and goes Context: The wound occurred when the patient after he had a fall and hurt himself. Modifying Factors: Patient must stand for long periods while working Associated Signs and Symptoms: Patient reports presence of swelling 74 year old gentleman who has a past medical history significant for gout, edema of the lower limbs, anemia, hypertension, acid reflux, depression, COPD, coronary artery disease and  history of sleep apnea. He has had a bypass surgery in 1988 and has had prostate cancer with radiation and seed implants in 2002. He is also known to have a pacemaker and defibrillator placed in 2014. he has been seen in the ER on 3 different occasions since the fall and they have been applying a dry dressing and some labs were done the last time around. No x-rays or ultrasound has been done. The patient is on an anticoagulant and this has not been held at any stage. During his ER notes from 03/10/2015 is noted that he had a wound on the left lower extremity which she's had for about 10 days now. The patient was noted to have extensive ecchymosis and tenderness and a laceration to the left lower extremity. Note is made of the fact that he had no signs of cellulitis and his labs were normal. He was given symptomatic treatment and asked to follow-up in the wound center. 03/24/2015 -- he has been doing fine and his dressing has been done by his family members and is at no issues. His treatment the blood thinner continuous. He will be going out of town this week and will be able to see as next week on Tuesday. 05/12/2015 -- it has been about 8 weeks since we are treating him with conservative therapy and is doing pretty well though there is some depth to the area and he continues to have to pack it. 05/19/2015 -- he inadvertently hit his left lower extremity against a wooden plank and had a fresh abrasion. Rick Gilmore, Rick Gilmore ([id number]) Other than that he's been doing fine. Objective Constitutional Pulse regular. Respirations normal and unlabored. Afebrile. Vitals Time Taken: 9:10 AM, Height: 75 in, Weight: 252 lbs, BMI: 31.5, Temperature: 98 F, Pulse: 72 bpm, Respiratory Rate: 18 breaths/min, Blood Pressure: 101/56 mmHg. Eyes Nonicteric. Reactive to light. Ears, Nose, Mouth, and Throat Lips, teeth, and gums WNL.Marland Kitchen Moist mucosa without lesions . Neck supple and nontender. No palpable  supraclavicular or cervical adenopathy. Normal sized without goiter. Respiratory WNL. No retractions.. Cardiovascular Pedal Pulses WNL. No clubbing, cyanosis or edema. Musculoskeletal Adexa without tenderness or enlargement.. Digits and nails w/o clubbing, cyanosis, infection, petechiae, ischemia, or inflammatory conditions.Marland Kitchen Psychiatric Judgement and insight Intact.. No evidence of depression, anxiety, or agitation.. General Notes: The wound which he  sustained last week is completely healed. Integumentary (Hair, Skin) the wound on his left lower extremity has now completely healed superficially except for the inferior part which has a sinus tract going down to about 2 cm.. No crepitus or fluctuance. No peri-wound warmth or erythema. No masses.. Wound #1 status is Open. Original cause of wound was Trauma. The wound is located on the Left Lower Leg. The wound measures 0.6cm length x 0.5cm width x 0.8cm depth; 0.236cm^2 area and 0.188cm^3 volume. The wound is limited to skin breakdown. There is no tunneling noted, however, there is undermining starting at 6:00 and ending at 9:00 with a maximum distance of 0.8cm. There is a medium Rick Gilmore, Rick Gilmore ([id number]) amount of serosanguineous drainage noted. The wound margin is distinct with the outline attached to the wound base. There is large (67-100%) pink granulation within the wound bed. There is a small (1-33%) amount of necrotic tissue within the wound bed including Adherent Slough. The periwound skin appearance had no abnormalities noted for moisture. The periwound skin appearance exhibited: Induration, Localized Edema, Hemosiderin Staining. The periwound skin appearance did not exhibit: Callus, Crepitus, Excoriation, Fluctuance, Friable, Rash, Scarring, Erythema. Periwound temperature was noted as No Abnormality. Wound #2 status is Open. Original cause of wound was Shear/Friction. The wound is located on the Left,Anterior Lower Leg. The  wound measures 0cm length x 0cm width x 0cm depth; 0cm^2 area and 0cm^3 volume. The wound is limited to skin breakdown. The wound margin is distinct with the outline attached to the wound base. There is large (67-100%) red, pink granulation within the wound bed. There is no necrotic tissue within the wound bed. The periwound skin appearance did not exhibit: Callus, Crepitus, Excoriation, Fluctuance, Friable, Induration, Localized Edema, Rash, Scarring, Dry/Scaly, Maceration, Moist, Atrophie Blanche, Cyanosis, Ecchymosis, Hemosiderin Staining, Mottled, Pallor, Rubor, Erythema. Periwound temperature was noted as No Abnormality. Assessment Active Problems ICD-10 L97.222 - Non-pressure chronic ulcer of left calf with fat layer exposed S81.812A - Laceration without foreign body, left lower leg, initial encounter S81.801A - Unspecified open wound, right lower leg, initial encounter The majority of the wound in the left lower extremity is completely healed except for the inferior part where he has a sinus tract which goes down about 2 cm. This part will be packed lightly with iodoform gauze and the rest of the wound just needs bordered foam dressing. He will change this daily and see me back next week. At this stage I do not believe we need a skin substitute. Plan Wound Cleansing: Wound #1 Left Lower Leg: Clean wound with Normal Saline. Anesthetic: Wound #1 Left Lower Leg: Topical Lidocaine 4% cream applied to wound bed prior to debridement Primary Wound Dressing: Wound #1 Left Lower Leg: Rick Gilmore, Rick Gilmore ([id number]) Iodoform packing Gauze Secondary Dressing: Wound #1 Left Lower Leg: Non-adherent pad Dressing Change Frequency: Wound #1 Left Lower Leg: Change dressing every other day. Follow-up Appointments: Wound #1 Left Lower Leg: Return Appointment in 1 week. Additional Orders / Instructions: Wound #1 Left Lower Leg: Increase protein intake. Activity as tolerated The majority of the  wound in the left lower extremity is completely healed except for the inferior part where he has a sinus tract which goes down about 2 cm. This part will be packed lightly with iodoform gauze and the rest of the wound just needs bordered foam dressing. He will change this daily and see me back next week. At this stage I do not believe we need a skin substitute. Electronic Signature(s) Signed:  05/26/2015 12:15:23 PM By: [name] MD, FACS Entered By: [name] on 05/26/2015 09:54:41 Rick Gilmore ([id number]) -------------------------------------------------------------------------------- SuperBill Details Patient Name: Rick Gilmore Date of Service: 05/26/2015 Medical Record Number: [id number] Patient Account Number: [id number] Date of Birth/Sex: 30-Apr-1941 (74 y.o. Male) Treating RN: Primary Care Physician: Deborra Medina Other Clinician: Referring Physician: Deborra Medina Treating Physician/Extender: [name] in Treatment: 10 Diagnosis Coding ICD-10 Codes Code Description [id number] Non-pressure chronic ulcer of left calf with fat layer exposed S81.812A Laceration without foreign body, left lower leg, initial encounter S81.801A Unspecified open wound, right lower leg, initial encounter Facility Procedures CPT4 Code: [id number] Description: [phone] - WOUND CARE VISIT-LEV 2 EST PT Modifier: Quantity: 1 Physician Procedures CPT4 Code: [id number] Description: [id number] - WC PHYS LEVEL 3 - EST PT ICD-10 Description Diagnosis L97.222 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

## 2015-05-27 NOTE — Progress Notes (Signed)
Rick Gilmore, Rick Gilmore ([id number]) Visit Report for 05/26/2015 Arrival Information Details Patient Name: Rick Gilmore, Rick Gilmore Date of Service: 05/26/2015 8:45 AM Medical Record Number: [id number] Patient Account Number: [id number] Date of Birth/Sex: 07-05-1941 (74 y.o. Male) Treating RN: Cornell Barman Primary Care Physician: Deborra Medina Other Clinician: Referring Physician: Deborra Medina Treating Physician/Extender: [name] in Treatment: 10 Visit Information History Since Last Visit Added or deleted any medications: No Patient Arrived: Ambulatory Any new allergies or adverse reactions: No Arrival Time: 09:11 Had a fall or experienced change in No Accompanied By: self activities of daily living that may affect Transfer Assistance: Manual risk of falls: Patient Identification Verified: Yes Signs or symptoms of abuse/neglect since last No Secondary Verification Process Yes visito Completed: Hospitalized since last visit: No Patient Requires Transmission- No Has Dressing in Place as Prescribed: Yes Based Precautions: Pain Present Now: No Patient Has Alerts: Yes Patient Alerts: Patient on Blood Thinner Electronic Signature(s) Signed: 05/26/2015 5:13:21 PM By: Gretta Cool, RN, BSN, Kim RN, BSN Entered By: Gretta Cool, RN, BSN, Kim on 05/26/2015 09:11:57 Rick Gilmore ([id number]) -------------------------------------------------------------------------------- Clinic Level of Care Assessment Details Patient Name: Rick Gilmore Date of Service: 05/26/2015 8:45 AM Medical Record Number: [id number] Patient Account Number: [id number] Date of Birth/Sex: [birth year] (74 y.o. Male) Treating RN: Cornell Barman Primary Care Physician: Deborra Medina Other Clinician: Referring Physician: Deborra Medina Treating Physician/Extender: [name] in Treatment: 10 Clinic Level of Care Assessment Items TOOL 4 Quantity Score []  - Use when only an EandM is performed on FOLLOW-UP visit 0 ASSESSMENTS -  Nursing Assessment / Reassessment []  - Reassessment of Co-morbidities (includes updates in patient status) 0 X - Reassessment of Adherence to Treatment Plan 1 5 ASSESSMENTS - Wound and Skin Assessment / Reassessment X - Simple Wound Assessment / Reassessment - one wound 1 5 []  - Complex Wound Assessment / Reassessment - multiple wounds 0 []  - Dermatologic / Skin Assessment (not related to wound area) 0 ASSESSMENTS - Focused Assessment []  - Circumferential Edema Measurements - multi extremities 0 []  - Nutritional Assessment / Counseling / Intervention 0 []  - Lower Extremity Assessment (monofilament, tuning fork, pulses) 0 []  - Peripheral Arterial Disease Assessment (using hand held doppler) 0 ASSESSMENTS - Ostomy and/or Continence Assessment and Care []  - Incontinence Assessment and Management 0 []  - Ostomy Care Assessment and Management (repouching, etc.) 0 PROCESS - Coordination of Care X - Simple Patient / Family Education for ongoing care 1 15 []  - Complex (extensive) Patient / Family Education for ongoing care 0 []  - Staff obtains Programmer, systems, Records, Test Results / Process Orders 0 []  - Staff telephones HHA, Nursing Homes / Clarify orders / etc 0 []  - Routine Transfer to another Facility (non-emergent condition) 0 Rick Gilmore, Rick Gilmore ([id number]) []  - Routine Hospital Admission (non-emergent condition) 0 []  - New Admissions / Biomedical engineer / Ordering NPWT, Apligraf, etc. 0 []  - Emergency Hospital Admission (emergent condition) 0 X - Simple Discharge Coordination 1 10 []  - Complex (extensive) Discharge Coordination 0 PROCESS - Special Needs []  - Pediatric / Minor Patient Management 0 []  - Isolation Patient Management 0 []  - Hearing / Language / Visual special needs 0 []  - Assessment of Community assistance (transportation, D/C planning, etc.) 0 []  - Additional assistance / Altered mentation 0 []  - Support Surface(s) Assessment (bed, cushion, seat, etc.) 0 INTERVENTIONS -  Wound Cleansing / Measurement X - Simple Wound Cleansing - one wound 1 5 []  - Complex Wound Cleansing - multiple wounds 0 X - Wound Imaging (photographs - any number  of wounds) 1 5 []  - Wound Tracing (instead of photographs) 0 X - Simple Wound Measurement - one wound 1 5 []  - Complex Wound Measurement - multiple wounds 0 INTERVENTIONS - Wound Dressings X - Small Wound Dressing one or multiple wounds 1 10 []  - Medium Wound Dressing one or multiple wounds 0 []  - Large Wound Dressing one or multiple wounds 0 []  - Application of Medications - topical 0 []  - Application of Medications - injection 0 INTERVENTIONS - Miscellaneous []  - External ear exam 0 Rick Gilmore, Rick Gilmore ([id number]) []  - Specimen Collection (cultures, biopsies, blood, body fluids, etc.) 0 []  - Specimen(s) / Culture(s) sent or taken to Lab for analysis 0 []  - Patient Transfer (multiple staff / Civil Service fast streamer / Similar devices) 0 []  - Simple Staple / Suture removal (25 or less) 0 []  - Complex Staple / Suture removal (26 or more) 0 []  - Hypo / Hyperglycemic Management (close monitor of Blood Glucose) 0 []  - Ankle / Brachial Index (ABI) - do not check if billed separately 0 X - Vital Signs 1 5 Has the patient been seen at the hospital within the last three years: Yes Total Score: 65 Level Of Care: New/Established - Level 2 Electronic Signature(s) Signed: 05/26/2015 5:13:21 PM By: Gretta Cool, RN, BSN, Kim RN, BSN Entered By: Gretta Cool, RN, BSN, Kim on 05/26/2015 09:31:05 Rick Gilmore ([id number]) -------------------------------------------------------------------------------- Encounter Discharge Information Details Patient Name: Rick Gilmore Date of Service: 05/26/2015 8:45 AM Medical Record Number: [id number] Patient Account Number: [id number] Date of Birth/Sex: 1941-03-15 (74 y.o. Male) Treating RN: Cornell Barman Primary Care Physician: Deborra Medina Other Clinician: Referring Physician: Deborra Medina Treating Physician/Extender: [name] in Treatment: 10 Encounter Discharge Information Items Discharge Pain Level: 0 Discharge Condition: Stable Ambulatory Status: Ambulatory Discharge Destination: Home Transportation: Private Auto Accompanied By: self Schedule Follow-up Appointment: Yes Medication Reconciliation completed and provided to Patient/Care Yes Giann Obara: Provided on Clinical Summary of Care: 05/26/2015 Form Type Recipient Paper Patient [facility] Electronic Signature(s) Signed: 05/26/2015 9:36:25 AM By: Ruthine Dose Entered By: Ruthine Dose on 05/26/2015 09:36:24 Rick Gilmore ([id number]) -------------------------------------------------------------------------------- Lower Extremity Assessment Details Patient Name: Rick Gilmore Date of Service: 05/26/2015 8:45 AM Medical Record Number: [id number] Patient Account Number: [id number] Date of Birth/Sex: May 25, 1941 (73 y.o. Male) Treating RN: Cornell Barman Primary Care Physician: Deborra Medina Other Clinician: Referring Physician: Deborra Medina Treating Physician/Extender: [name] in Treatment: 10 Edema Assessment Assessed: Shirlyn Goltz: No] [Right: No] Edema: [Left: N] [Right: o] Vascular Assessment Pulses: Posterior Tibial Palpable: [Left:Yes] Dorsalis Pedis Palpable: [Left:Yes] Extremity colors, hair growth, and conditions: Extremity Color: [Left:Normal] Hair Growth on Extremity: [Left:Yes] Temperature of Extremity: [Left:Warm] Capillary Refill: [Left:< 3 seconds] Toe Nail Assessment Left: Right: Thick: No Discolored: No Deformed: No Improper Length and Hygiene: No Electronic Signature(s) Signed: 05/26/2015 5:13:21 PM By: Gretta Cool, RN, BSN, Kim RN, BSN Entered By: Gretta Cool, RN, BSN, Kim on 05/26/2015 09:15:20 Rick Gilmore ([id number]) -------------------------------------------------------------------------------- Multi Wound Chart Details Patient Name: Rick Gilmore Date of Service: 05/26/2015 8:45 AM Medical Record Number:  [id number] Patient Account Number: [id number] Date of Birth/Sex: [birth year] (73 y.o. Male) Treating RN: Cornell Barman Primary Care Physician: Deborra Medina Other Clinician: Referring Physician: Deborra Medina Treating Physician/Extender: [name] in Treatment: 10 Vital Signs Height(in): 75 Pulse(bpm): 72 Weight(lbs): 252 Blood Pressure 101/56 (mmHg): Body Mass Index(BMI): 31 Temperature(F): 98 Respiratory Rate 18 (breaths/min): Photos: [[name]:[name]] Wound Location: Left Lower Leg Left Lower Leg - Anterior [name] Wounding Event: Trauma Shear/Friction [name] Primary Etiology: Trauma, Other Trauma, Other [name] Comorbid History:  Anemia, Chronic Anemia, Chronic [name] Obstructive Pulmonary Obstructive Pulmonary Disease (COPD), Sleep Disease (COPD), Sleep Apnea, Arrhythmia, Apnea, Arrhythmia, Congestive Heart Failure, Congestive Heart Failure, Coronary Artery Disease, Coronary Artery Disease, Hypertension, Myocardial Hypertension, Myocardial Infarction, Gout, Received Infarction, Gout, Received Radiation Radiation Date Acquired: 03/04/2015 05/16/2015 [name] Weeks of Treatment: 10 1 [name] Wound Status: Open Open [name] Measurements L x W x D 0.6x0.5x0.8 0x0x0 [name] (cm) Area (cm) : 0.236 0 [name] Volume (cm) : 0.188 0 [name] % Reduction in Area: 68.70% 100.00% [name] % Reduction in Volume: -24.50% 100.00% [name] Starting Position 1 6 (o'clock): Rick Gilmore, Rick Gilmore ([id number]) Ending Position 1 9 (o'clock): Maximum Distance 1 0.8 (cm): Undermining: Yes [name] [name] Classification: Full Thickness Without Partial Thickness [name] Exposed Support Structures Exudate Amount: Medium [name] [name] Exudate Type: Serosanguineous [name] [name] Exudate Color: red, brown [name] [name] Wound Margin: Distinct, outline attached Distinct, outline attached [name] Granulation Amount: Large (67-100%) Large (67-100%) [name] Granulation Quality: Pink Red, Pink [name] Necrotic Amount: Small (1-33%) None Present (0%) [name] Exposed Structures: Fascia:  No Fascia: No [name] Fat: No Fat: No Tendon: No Tendon: No Muscle: No Muscle: No Joint: No Joint: No Bone: No Bone: No Limited to Skin Limited to Skin Breakdown Breakdown Epithelialization: Small (1-33%) Large (67-100%) [name] Periwound Skin Texture: Edema: Yes Edema: No [name] Induration: Yes Excoriation: No Excoriation: No Induration: No Callus: No Callus: No Crepitus: No Crepitus: No Fluctuance: No Fluctuance: No Friable: No Friable: No Rash: No Rash: No Scarring: No Scarring: No Periwound Skin No Abnormalities Noted Maceration: No [name] Moisture: Moist: No Dry/Scaly: No Periwound Skin Color: Hemosiderin Staining: Yes Atrophie Blanche: No [name] Erythema: No Cyanosis: No Ecchymosis: No Erythema: No Hemosiderin Staining: No Mottled: No Pallor: No Rubor: No Temperature: No Abnormality No Abnormality [name] Tenderness on No No [name] Palpation: Wound Preparation: Ulcer Cleansing: Ulcer Cleansing: [name] Rinsed/Irrigated with Rinsed/Irrigated with Saline Saline Rick Gilmore, Rick Gilmore ([id number]) Topical Anesthetic Topical Anesthetic Applied: Other: 4% Applied: None lidocaine cream Treatment Notes Electronic Signature(s) Signed: 05/26/2015 5:13:21 PM By: Gretta Cool, RN, BSN, Kim RN, BSN Entered By: Gretta Cool, RN, BSN, Kim on 05/26/2015 09:27:07 Rick Gilmore ([id number]) -------------------------------------------------------------------------------- Multi-Disciplinary Care Plan Details Patient Name: Rick Gilmore, Rick Gilmore Date of Service: 05/26/2015 8:45 AM Medical Record Number: [id number] Patient Account Number: [id number] Date of Birth/Sex: [birth year] (74 y.o. Male) Treating RN: Cornell Barman Primary Care Physician: Deborra Medina Other Clinician: Referring Physician: Deborra Medina Treating Physician/Extender: [name] in Treatment: 10 Active Inactive Abuse / Safety / Falls / Self Care Management Nursing Diagnoses: Potential for falls Goals: Patient will remain injury free Date  Initiated: 03/17/2015 Goal Status: Active Interventions: Assess fall risk on admission and as needed Notes: Necrotic Tissue Nursing Diagnoses: Impaired tissue integrity related to necrotic/devitalized tissue Goals: Necrotic/devitalized tissue will be minimized in the wound bed Date Initiated: 03/17/2015 Goal Status: Active Interventions: Assess patient pain level pre-, during and post procedure and prior to discharge Treatment Activities: Apply topical anesthetic as ordered : 05/26/2015 Notes: Orientation to the Wound Care Program Nursing Diagnoses: Knowledge deficit related to the wound healing center program Rick Gilmore, Rick Gilmore ([id number]) Goals: Patient/caregiver will verbalize understanding of the [place] Date Initiated: 03/17/2015 Goal Status: Active Interventions: Provide education on orientation to the wound center Notes: Wound/Skin Impairment Nursing Diagnoses: Impaired tissue integrity Goals: Patient/caregiver will verbalize understanding of skin care regimen Date Initiated: 03/17/2015 Goal Status: Active Ulcer/skin breakdown will heal within 14 weeks Date Initiated: 03/17/2015 Goal Status: Active Interventions: Assess patient/caregiver ability to obtain necessary supplies Notes: Electronic Signature(s) Signed: 05/26/2015 5:13:21  PM By: Gretta Cool, RN, BSN, Kim RN, BSN Entered By: Gretta Cool, RN, BSN, Kim on 05/26/2015 09:26:51 Rick Gilmore ([id number]) -------------------------------------------------------------------------------- Pain Assessment Details Patient Name: Rick Gilmore, Rick Gilmore Date of Service: 05/26/2015 8:45 AM Medical Record Number: [id number] Patient Account Number: [id number] Date of Birth/Sex: November 10, 1941 (73 y.o. Male) Treating RN: Cornell Barman Primary Care Physician: Deborra Medina Other Clinician: Referring Physician: Deborra Medina Treating Physician/Extender: [name] in Treatment: 10 Active Problems Location of Pain Severity and  Description of Pain Patient Has Paino No Site Locations Pain Management and Medication Current Pain Management: Electronic Signature(s) Signed: 05/26/2015 5:13:21 PM By: Gretta Cool, RN, BSN, Kim RN, BSN Entered By: Gretta Cool, RN, BSN, Kim on 05/26/2015 09:12:02 Rick Gilmore ([id number]) -------------------------------------------------------------------------------- Patient/Caregiver Education Details Patient Name: Rick Gilmore Date of Service: 05/26/2015 8:45 AM Medical Record Number: [id number] Patient Account Number: [id number] Date of Birth/Gender: [birth year] (73 y.o. Male) Treating RN: Cornell Barman Primary Care Physician: Deborra Medina Other Clinician: Referring Physician: Deborra Medina Treating Physician/Extender: [name] in Treatment: 10 Education Assessment Education Provided To: Patient Education Topics Provided Wound/Skin Impairment: Handouts: Caring for Your Ulcer, Skin Care Do's and Dont's, Other: continue wound care as prescribed Electronic Signature(s) Signed: 05/26/2015 5:13:21 PM By: Gretta Cool, RN, BSN, Kim RN, BSN Entered By: Gretta Cool, RN, BSN, Kim on 05/26/2015 09:32:31 Rick Gilmore, Rick Gilmore ([id number]) -------------------------------------------------------------------------------- Wound Assessment Details Patient Name: Rick Gilmore, Rick Gilmore Date of Service: 05/26/2015 8:45 AM Medical Record Number: [id number] Patient Account Number: [id number] Date of Birth/Sex: 11-13-1941 (74 y.o. Male) Treating RN: Cornell Barman Primary Care Physician: Deborra Medina Other Clinician: Referring Physician: Deborra Medina Treating Physician/Extender: [name] in Treatment: 10 Wound Status Wound Number: 1 Primary Trauma, Other Etiology: Wound Location: Left Lower Leg Wound Open Wounding Event: Trauma Status: Date Acquired: 03/04/2015 Comorbid Anemia, Chronic Obstructive Pulmonary Weeks Of Treatment: 10 History: Disease (COPD), Sleep Apnea, Clustered Wound: No Arrhythmia,  Congestive Heart Failure, Coronary Artery Disease, Hypertension, Myocardial Infarction, Gout, Received Radiation Photos Photo Uploaded By: Gretta Cool, RN, BSN, Kim on 05/26/2015 09:25:48 Wound Measurements Length: (cm) 0.6 Width: (cm) 0.5 Depth: (cm) 0.8 Area: (cm) 0.236 Volume: (cm) 0.188 % Reduction in Area: 68.7% % Reduction in Volume: -24.5% Epithelialization: Small (1-33%) Tunneling: No Undermining: Yes Starting Position (o'clock): 6 Ending Position (o'clock): 9 Maximum Distance: (cm) 0.8 Wound Description Full Thickness Without Exposed Classification: Support Structures Wound Margin: Distinct, outline attached Medium Rick Gilmore, Rick Gilmore ([id number]) Foul Odor After Cleansing: No Exudate Amount: Exudate Type: Serosanguineous Exudate Color: red, brown Wound Bed Granulation Amount: Large (67-100%) Exposed Structure Granulation Quality: Pink Fascia Exposed: No Necrotic Amount: Small (1-33%) Fat Layer Exposed: No Necrotic Quality: Adherent Slough Tendon Exposed: No Muscle Exposed: No Joint Exposed: No Bone Exposed: No Limited to Skin Breakdown Periwound Skin Texture Texture Color No Abnormalities Noted: No No Abnormalities Noted: No Callus: No Erythema: No Crepitus: No Hemosiderin Staining: Yes Excoriation: No Temperature / Pain Fluctuance: No Temperature: No Abnormality Friable: No Induration: Yes Localized Edema: Yes Rash: No Scarring: No Moisture No Abnormalities Noted: Yes Wound Preparation Ulcer Cleansing: Rinsed/Irrigated with Saline Topical Anesthetic Applied: Other: 4% lidocaine cream, Treatment Notes Wound #1 (Left Lower Leg) 1. Cleansed with: Clean wound with Normal Saline 2. Anesthetic Topical Lidocaine 4% cream to wound bed prior to debridement 4. Dressing Applied: Mepitel Iodoform packing Gauze 5. Secondary Dressing Applied Bordered Foam Dressing Electronic Signature(s) Rick Gilmore, Rick Gilmore ([id number]) Signed: 05/26/2015 5:13:21 PM By:  Gretta Cool, RN, BSN, Kim RN, BSN Entered By: Gretta Cool, RN, BSN, Kim on 05/26/2015 09:21:52 Rick Gilmore ([id number]) -------------------------------------------------------------------------------- Wound Assessment Details Patient Name:  GUILLAUME, BRUMMER Date of Service: 05/26/2015 8:45 AM Medical Record Number: [id number] Patient Account Number: [id number] Date of Birth/Sex: 02-01-1941 (73 y.o. Male) Treating RN: Cornell Barman Primary Care Physician: Deborra Medina Other Clinician: Referring Physician: Deborra Medina Treating Physician/Extender: [name] in Treatment: 10 Wound Status Wound Number: 2 Primary Trauma, Other Etiology: Wound Location: Left Lower Leg - Anterior Wound Open Wounding Event: Shear/Friction Status: Date Acquired: 05/16/2015 Comorbid Anemia, Chronic Obstructive Pulmonary Weeks Of Treatment: 1 History: Disease (COPD), Sleep Apnea, Clustered Wound: No Arrhythmia, Congestive Heart Failure, Coronary Artery Disease, Hypertension, Myocardial Infarction, Gout, Received Radiation Photos Photo Uploaded By: Gretta Cool, RN, BSN, Kim on 05/26/2015 09:25:49 Wound Measurements Length: (cm) Width: (cm) Depth: (cm) Area: (cm) Volume: (cm) 0 % Reduction in Area: 100% 0 % Reduction in Volume: 100% 0 Epithelialization: Large (67-100%) 0 0 Wound Description Classification: Partial Thickness Wound Margin: Distinct, outline attached Foul Odor After Cleansing: No Wound Bed Granulation Amount: Large (67-100%) Exposed Structure Granulation Quality: Red, Pink Fascia Exposed: No Necrotic Amount: None Present (0%) Fat Layer Exposed: No Tendon Exposed: No Boxell, Troyce ([id number]) Muscle Exposed: No Joint Exposed: No Bone Exposed: No Limited to Skin Breakdown Periwound Skin Texture Texture Color No Abnormalities Noted: No No Abnormalities Noted: No Callus: No Atrophie Blanche: No Crepitus: No Cyanosis: No Excoriation: No Ecchymosis: No Fluctuance: No Erythema:  No Friable: No Hemosiderin Staining: No Induration: No Mottled: No Localized Edema: No Pallor: No Rash: No Rubor: No Scarring: No Temperature / Pain Moisture Temperature: No Abnormality No Abnormalities Noted: No Dry / Scaly: No Maceration: No Moist: No Wound Preparation Ulcer Cleansing: Rinsed/Irrigated with Saline Topical Anesthetic Applied: None Electronic Signature(s) Signed: 05/26/2015 5:13:21 PM By: Gretta Cool, RN, BSN, Kim RN, BSN Entered By: Gretta Cool, RN, BSN, Kim on 05/26/2015 09:22:19 Rick Gilmore ([id number]) -------------------------------------------------------------------------------- [place] Details Patient Name: Rick Gilmore Date of Service: 05/26/2015 8:45 AM Medical Record Number: [id number] Patient Account Number: [id number] Date of Birth/Sex: Jan 04, 1941 (73 y.o. Male) Treating RN: Cornell Barman Primary Care Physician: Deborra Medina Other Clinician: Referring Physician: Deborra Medina Treating Physician/Extender: [name] in Treatment: 10 Vital Signs Time Taken: 09:10 Temperature (F): 98 Height (in): 75 Pulse (bpm): 72 Weight (lbs): 252 Respiratory Rate (breaths/min): 18 Body Mass Index (BMI): 31.5 Blood Pressure (mmHg): 101/56 Reference Range: 80 - 120 mg / dl Electronic Signature(s) Signed: 05/26/2015 5:13:21 PM By: Gretta Cool, RN, BSN, Kim RN, BSN Entered By: Gretta Cool, RN, BSN, Kim on 05/26/2015 09:14:18

## 2015-05-28 ENCOUNTER — Other Ambulatory Visit: Payer: Self-pay | Admitting: Internal Medicine

## 2015-05-29 ENCOUNTER — Encounter: Payer: Self-pay | Admitting: *Deleted

## 2015-05-30 ENCOUNTER — Ambulatory Visit: Payer: Medicare Other | Admitting: Anesthesiology

## 2015-05-30 ENCOUNTER — Encounter: Payer: Self-pay | Admitting: Anesthesiology

## 2015-05-30 ENCOUNTER — Ambulatory Visit
Admission: RE | Admit: 2015-05-30 | Discharge: 2015-05-30 | Disposition: A | Discharge status: Home / Self Care | Payer: Medicare Other | Source: Ambulatory Visit | Attending: Unknown Physician Specialty | Admitting: Unknown Physician Specialty

## 2015-05-30 ENCOUNTER — Encounter
Admission: RE | Disposition: A | Discharge status: Home / Self Care | Payer: Self-pay | Source: Ambulatory Visit | Attending: Unknown Physician Specialty

## 2015-05-30 DIAGNOSIS — I4891 Unspecified atrial fibrillation: Secondary | ICD-10-CM | POA: Diagnosis not present

## 2015-05-30 DIAGNOSIS — Z79899 Other long term (current) drug therapy: Secondary | ICD-10-CM | POA: Diagnosis not present

## 2015-05-30 DIAGNOSIS — Z95810 Presence of automatic (implantable) cardiac defibrillator: Secondary | ICD-10-CM | POA: Insufficient documentation

## 2015-05-30 DIAGNOSIS — Z8546 Personal history of malignant neoplasm of prostate: Secondary | ICD-10-CM | POA: Insufficient documentation

## 2015-05-30 DIAGNOSIS — J449 Chronic obstructive pulmonary disease, unspecified: Secondary | ICD-10-CM | POA: Insufficient documentation

## 2015-05-30 DIAGNOSIS — I2510 Atherosclerotic heart disease of native coronary artery without angina pectoris: Secondary | ICD-10-CM | POA: Diagnosis not present

## 2015-05-30 DIAGNOSIS — I509 Heart failure, unspecified: Secondary | ICD-10-CM | POA: Diagnosis not present

## 2015-05-30 DIAGNOSIS — I252 Old myocardial infarction: Secondary | ICD-10-CM | POA: Insufficient documentation

## 2015-05-30 DIAGNOSIS — D123 Benign neoplasm of transverse colon: Secondary | ICD-10-CM | POA: Diagnosis not present

## 2015-05-30 DIAGNOSIS — K929 Disease of digestive system, unspecified: Secondary | ICD-10-CM | POA: Insufficient documentation

## 2015-05-30 DIAGNOSIS — D509 Iron deficiency anemia, unspecified: Secondary | ICD-10-CM | POA: Insufficient documentation

## 2015-05-30 DIAGNOSIS — Z86010 Personal history of colonic polyps: Secondary | ICD-10-CM | POA: Insufficient documentation

## 2015-05-30 DIAGNOSIS — D122 Benign neoplasm of ascending colon: Secondary | ICD-10-CM | POA: Diagnosis not present

## 2015-05-30 DIAGNOSIS — R195 Other fecal abnormalities: Secondary | ICD-10-CM | POA: Diagnosis not present

## 2015-05-30 DIAGNOSIS — Z951 Presence of aortocoronary bypass graft: Secondary | ICD-10-CM | POA: Diagnosis not present

## 2015-05-30 DIAGNOSIS — D126 Benign neoplasm of colon, unspecified: Secondary | ICD-10-CM

## 2015-05-30 DIAGNOSIS — K648 Other hemorrhoids: Secondary | ICD-10-CM | POA: Insufficient documentation

## 2015-05-30 DIAGNOSIS — Z7901 Long term (current) use of anticoagulants: Secondary | ICD-10-CM | POA: Diagnosis not present

## 2015-05-30 DIAGNOSIS — K210 Gastro-esophageal reflux disease with esophagitis: Secondary | ICD-10-CM | POA: Diagnosis not present

## 2015-05-30 DIAGNOSIS — K209 Esophagitis, unspecified: Secondary | ICD-10-CM | POA: Diagnosis not present

## 2015-05-30 DIAGNOSIS — D120 Benign neoplasm of cecum: Secondary | ICD-10-CM | POA: Diagnosis not present

## 2015-05-30 DIAGNOSIS — K317 Polyp of stomach and duodenum: Secondary | ICD-10-CM | POA: Diagnosis not present

## 2015-05-30 DIAGNOSIS — E785 Hyperlipidemia, unspecified: Secondary | ICD-10-CM | POA: Insufficient documentation

## 2015-05-30 DIAGNOSIS — I10 Essential (primary) hypertension: Secondary | ICD-10-CM | POA: Diagnosis not present

## 2015-05-30 DIAGNOSIS — K2960 Other gastritis without bleeding: Secondary | ICD-10-CM | POA: Diagnosis not present

## 2015-05-30 DIAGNOSIS — K3189 Other diseases of stomach and duodenum: Secondary | ICD-10-CM | POA: Diagnosis not present

## 2015-05-30 DIAGNOSIS — K640 First degree hemorrhoids: Secondary | ICD-10-CM | POA: Diagnosis not present

## 2015-05-30 DIAGNOSIS — G4733 Obstructive sleep apnea (adult) (pediatric): Secondary | ICD-10-CM | POA: Insufficient documentation

## 2015-05-30 DIAGNOSIS — K2970 Gastritis, unspecified, without bleeding: Secondary | ICD-10-CM | POA: Diagnosis not present

## 2015-05-30 DIAGNOSIS — K635 Polyp of colon: Secondary | ICD-10-CM | POA: Diagnosis not present

## 2015-05-30 HISTORY — PX: COLONOSCOPY WITH PROPOFOL: SHX5780

## 2015-05-30 HISTORY — DX: Presence of automatic (implantable) cardiac defibrillator: Z95.810

## 2015-05-30 HISTORY — PX: ESOPHAGOGASTRODUODENOSCOPY: SHX5428

## 2015-05-30 HISTORY — DX: Gastro-esophageal reflux disease without esophagitis: K21.9

## 2015-05-30 SURGERY — COLONOSCOPY WITH PROPOFOL
Anesthesia: General

## 2015-05-30 MED ORDER — LACTATED RINGERS IV SOLN
INTRAVENOUS | Status: DC
  Administered 2015-05-30: 1000 mL via INTRAVENOUS

## 2015-05-30 MED ORDER — PHENYLEPHRINE HCL 10 MG/ML IJ SOLN
INTRAMUSCULAR | Status: DC | PRN
  Administered 2015-05-30: 50 ug via INTRAVENOUS
  Administered 2015-05-30: 100 ug via INTRAVENOUS

## 2015-05-30 MED ORDER — EPHEDRINE SULFATE 50 MG/ML IJ SOLN
INTRAMUSCULAR | Status: DC | PRN
  Administered 2015-05-30: 10 mg via INTRAVENOUS
  Administered 2015-05-30 (×3): 15 mg via INTRAVENOUS

## 2015-05-30 MED ORDER — PROPOFOL INFUSION 10 MG/ML OPTIME
INTRAVENOUS | Status: DC | PRN
  Administered 2015-05-30: 140 ug/kg/min via INTRAVENOUS

## 2015-05-30 MED ORDER — LIDOCAINE HCL (CARDIAC) 20 MG/ML IV SOLN
INTRAVENOUS | Status: DC | PRN
  Administered 2015-05-30: 80 mg via INTRAVENOUS

## 2015-05-30 MED ORDER — MIDAZOLAM HCL 2 MG/2ML IJ SOLN
INTRAMUSCULAR | Status: DC | PRN
  Administered 2015-05-30: 2 mg via INTRAVENOUS

## 2015-05-30 MED ORDER — SODIUM CHLORIDE 0.9 % IV SOLN: INTRAVENOUS | Status: DC

## 2015-05-30 NOTE — Op Note (Signed)
[facility] Patient Name: Rick Gilmore Procedure Date: 05/30/2015 9:54 AM MRN: [id number] Account #: [id number] Date of Birth: 1941/10/25 Admit Type: Outpatient Age: 74 Room: [facility] ENDO ROOM 1 Gender: Male Note Status: Finalized Procedure:         Upper GI endoscopy Indications:       Iron deficiency anemia, Heme positive stool Providers:         Manya Silvas, MD Referring MD:      Deborra Medina, MD (Referring MD) Medicines:         Propofol per Anesthesia Complications:     No immediate complications. Procedure:         Pre-Anesthesia Assessment:                    - After reviewing the risks and benefits, the patient was                     deemed in satisfactory condition to undergo the procedure.                    After obtaining informed consent, the endoscope was passed                     under direct vision. Throughout the procedure, the                     patient's blood pressure, pulse, and oxygen saturations                     were monitored continuously. The Olympus GIF-160 endoscope                     (S#. [id number]) was introduced through the mouth, and                     advanced to the second part of duodenum. The upper GI                     endoscopy was accomplished without difficulty. The patient                     tolerated the procedure well. Findings:      LA Grade A (one or more mucosal breaks less than 5 mm, not extending       between tops of 2 mucosal folds) esophagitis with no bleeding was found       48 cm from the incisors. Biopsies were taken with a cold forceps for       histology.      Diffuse and patchy mild inflammation characterized by congestion       (edema), erythema and granularity was found in the gastric body. Looks       llike milld portal hypertensive gastrophophy. the antrum looked good.      A single small nodule was found in the duodenal bulb. Biopsies were       taken with a cold forceps  for histology. Scattered duodenal hyperplasia       noted. 2ed portion was normal. Impression:        - LA Grade A reflux esophagitis. Rule out Barrett's                     esophagus. Biopsied.                    -  Gastritis.                    - Nodule found in the duodenum. Biopsied. Recommendation:    - Await pathology results. Manya Silvas, MD 05/30/2015 10:19:42 AM This report has been signed electronically. Number of Addenda: 0 Note Initiated On: 05/30/2015 9:54 AM      [facility]

## 2015-05-30 NOTE — Addendum Note (Signed)
Addendum  created 05/30/15 1818 by Alvin Critchley, MD   Modules edited: Anesthesia Events, Narrator   Narrator:  Narrator: Event Log Edited

## 2015-05-30 NOTE — Op Note (Signed)
[facility] Patient Name: Rick Gilmore Procedure Date: 05/30/2015 9:54 AM MRN: [id number] Account #: [id number] Date of Birth: 1941/06/26 Admit Type: Outpatient Age: 74 Room:  [facility] ENDO ROOM 1 Gender: Male Note Status: Finalized Procedure:         Colonoscopy Indications:       Heme positive stool, Iron deficiency anemia Providers:         Manya Silvas, MD Referring MD:      Deborra Medina, MD (Referring MD) Medicines:         Propofol per Anesthesia Complications:     No immediate complications. Procedure:         Pre-Anesthesia Assessment:                    - After reviewing the risks and benefits, the patient was                     deemed in satisfactory condition to undergo the procedure.                    After obtaining informed consent, the colonoscope was                     passed under direct vision. Throughout the procedure, the                     patient's blood pressure, pulse, and oxygen saturations                     were monitored continuously. The Olympus PCF-160AL                     Colonoscope (S#. [id number]) was introduced through the anus                     and advanced to the the cecum, identified by appendiceal                     orifice and ileocecal valve. The colonoscopy was somewhat                     difficult due to a tortuous colon. Successful completion                     of the procedure was aided by applying abdominal pressure.                     The patient tolerated the procedure well. Findings:      Two sessile polyps were found in the cecum. The polyps were small in       size. These polyps were removed with a hot snare. Resection and       retrieval were complete. Three hemostatic clips were successfully       placed. Bleeding had stopped at the end of the procedure. Due to some       gas pressure in the right colon local mucosal distention was seen to       cause minimal focal bleeding.      Two  sessile polyps were found in the ascending colon. The polyps were       diminutive in size. These polyps were removed with a jumbo cold forceps.       Resection and retrieval were complete. 3 clips were used.  A diminutive polyp was found in the transverse colon. The polyp was       sessile. The polyp was removed with a jumbo cold forceps. Resection and       retrieval were complete.      Internal hemorrhoids were found during endoscopy. The hemorrhoids were       small and Grade I (internal hemorrhoids that do not prolapse). Impression:        - Two small polyps in the cecum. Resected and retrieved.                     Clips were placed.                    - Two diminutive polyps in the ascending colon. Resected                     and retrieved.                    - One diminutive polyp in the transverse colon. Resected                     and retrieved.                    - Internal hemorrhoids. Recommendation:    - Await pathology results. Manya Silvas, MD 05/30/2015 11:10:55 AM This report has been signed electronically. Number of Addenda: 0 Note Initiated On: 05/30/2015 9:54 AM Scope Withdrawal Time: 0 hours 25 minutes 24 seconds  Total Procedure Duration: 0 hours 41 minutes 55 seconds       [facility]

## 2015-05-30 NOTE — Transfer of Care (Signed)
Immediate Anesthesia Transfer of Care Note  Patient: Rick Gilmore  Procedure(s) Performed: Procedure(s): COLONOSCOPY WITH PROPOFOL ([name]) ESOPHAGOGASTRODUODENOSCOPY (EGD) ([name])  Patient Location: PACU and Endoscopy Unit  Anesthesia Type:General  Level of Consciousness: awake, alert  and oriented  Airway & Oxygen Therapy: Patient Spontanous Breathing and Patient connected to nasal cannula oxygen  Post-op Assessment: Report given to RN and Post -op Vital signs reviewed and stable  Post vital signs: Reviewed and stable  Last Vitals:  Filed Vitals:   05/30/15 1109  BP: 99/56  Pulse: 61  Temp: 35.9 C  Resp: 21    Complications: No apparent anesthesia complications

## 2015-05-30 NOTE — H&P (Signed)
Primary Care Physician:  Crecencio Mc, MD Primary Gastroenterologist:  Dr. Vira Agar  Pre-Procedure History & Physical: HPI:  Rick Gilmore is a 74 y.o. male is here for an endoscopy and colonoscopy.   Past Medical History  Diagnosis Date  . CAD (coronary artery disease)   . Atrial fibrillation     on Coumadin  . Hypertension   . Erectile dysfunction   . Hyperlipidemia   . Ischemic cardiomyopathy     s/p AICD/pacer 2009, replaced 2010 [facility]  . Cancer 2001    Prostate, XRT and implant  . Tubular adenoma     colon polyp  . Personal history of prostate cancer 2001    s/p XRT and implant ([facility])  . Sleep apnea, obstructive     uses CPAP nightly  . Anemia   . Thrombocytopenia   . Prostate cancer   . CHF (congestive heart failure)   . MI (myocardial infarction) 1988  . Arrhythmia     a fib  . COPD (chronic obstructive pulmonary disease)   . Vitamin B 12 deficiency   . AICD (automatic cardioverter/defibrillator) present   . GERD (gastroesophageal reflux disease)     Past Surgical History  Procedure Laterality Date  . Vasectomy    . Coronary artery bypass graft  1988  . Implantable cardioverter defibrillator generator change  April 2014    Bi V RRR  . Insert / replace / remove pacemaker  2014  . Cardiac catheterization      Prior to Admission medications   Medication Sig Start Date End Date Taking? Authorizing Provider  allopurinol (ZYLOPRIM) 100 MG tablet Take 100 mg by mouth daily.   Yes Historical Provider, MD  Cetirizine HCl ([employer] ALLERGY RELIEF) 10 MG CAPS Take 1 capsule by mouth daily as needed (allergies).   Yes Historical Provider, MD  colchicine 0.6 MG tablet Take 0.6 mg by mouth daily as needed (gout flare).    Yes Historical Provider, MD  cyanocobalamin (,VITAMIN B-12,) 1000 MCG/ML injection Inject 1,000 mcg into the muscle every 30 (thirty) days.   Yes Historical Provider, MD  doxazosin (CARDURA) 4 MG tablet Take 4 mg by mouth at bedtime.   Yes Historical  Provider, MD  ELIQUIS 5 MG TABS tablet TAKE 1 TABLET TWICE A DAY 03/14/15  Yes Crecencio Mc, MD  escitalopram (LEXAPRO) 10 MG tablet TAKE 1 TABLET DAILY 03/10/15  Yes Crecencio Mc, MD  esomeprazole (NEXIUM) 40 MG capsule Take 40 mg by mouth daily at 12 noon.   Yes Historical Provider, MD  furosemide (LASIX) 80 MG tablet TAKE 1 TABLET DAILY 04/28/15  Yes Crecencio Mc, MD  isosorbide mononitrate (IMDUR) 30 MG 24 hr tablet Take 30 mg by mouth daily. 10/08/14  Yes Historical Provider, MD  lisinopril (PRINIVIL,ZESTRIL) 5 MG tablet Take 2.5 mg by mouth 2 (two) times daily.   Yes Historical Provider, MD  metoprolol succinate (TOPROL-XL) 50 MG 24 hr tablet Take 100 mg by mouth daily. Take with or immediately following a meal.   Yes Historical Provider, MD  potassium chloride SA (K-DUR,KLOR-CON) 20 MEQ tablet Take 1 tablet (20 mEq total) by mouth daily. 11/28/14  Yes Crecencio Mc, MD  allopurinol (ZYLOPRIM) 300 MG tablet Take 1 tablet (300 mg total) by mouth daily. Patient not taking: Reported on 05/28/2015 04/24/14   Wallene Huh, DPM  metoprolol succinate (TOPROL-XL) 100 MG 24 hr tablet TAKE 1 TABLET DAILY Patient not taking: Reported on 05/28/2015 05/28/15   Aris Everts  Derrel Nip, MD  spironolactone (ALDACTONE) 25 MG tablet Take 1 tablet (25 mg total) by mouth daily. Patient not taking: Reported on 05/29/2015 11/28/14   Crecencio Mc, MD    Allergies as of 05/22/2015 - Review Complete 05/20/2015  Allergen Reaction Noted  . No known allergies  10/29/2014    Family History  Problem Relation Age of Onset  . Hypertension Mother   . Hypertension Father   . Cancer Father     prostate, throat  . Emphysema Father   . Heart disease Maternal Grandmother   . Heart disease Paternal Grandmother     History   Social History  . Marital Status: Single    Spouse Name: [name]  . Number of Children: [name]  . Years of Education: [name]   Occupational History  . Not on file.   Social History Main Topics  .  Smoking status: Never Smoker   . Smokeless tobacco: Never Used  . Alcohol Use: 1.0 oz/week    2 Standard drinks or equivalent per week  . Drug Use: No  . Sexual Activity: Not on file   Other Topics Concern  . Not on file   Social History Narrative    Review of Systems: See HPI, otherwise negative ROS  Physical Exam: BP 117/63 mmHg  Temp(Src) 97.7 F (36.5 C) (Tympanic)  Resp 16  Ht 6\' 3"  (1.905 m)  Wt 113.399 kg (250 lb)  BMI 31.25 kg/m2  SpO2 95% General:   Alert,  pleasant and cooperative in NAD Head:  Normocephalic and atraumatic. Neck:  Supple; no masses or thyromegaly. Lungs:  Clear throughout to auscultation.    Heart:  Regular rate and rhythm. Abdomen:  Soft, nontender and nondistended. Normal bowel sounds, without guarding, and without rebound.   Neurologic:  Alert and  oriented x4;  grossly normal neurologically.  Impression/Plan: Rick Gilmore is here for an endoscopy and colonoscopy to be performed for heme positive stool and iron def anemia and personal history of colon polyps  Risks, benefits, limitations, and alternatives regarding  endoscopy and colonoscopy have been reviewed with the patient.  Questions have been answered.  All parties agreeable.   Gaylyn Cheers, MD  05/30/2015, 10:01 AM   Primary Care Physician:  Crecencio Mc, MD Primary Gastroenterologist:  Dr. Vira Agar  Pre-Procedure History & Physical: HPI:  Rick Gilmore is a 74 y.o. male is here for an endoscopy and colonoscopy.   Past Medical History  Diagnosis Date  . CAD (coronary artery disease)   . Atrial fibrillation     on Coumadin  . Hypertension   . Erectile dysfunction   . Hyperlipidemia   . Ischemic cardiomyopathy     s/p AICD/pacer 2009, replaced 2010 [facility]  . Cancer 2001    Prostate, XRT and implant  . Tubular adenoma     colon polyp  . Personal history of prostate cancer 2001    s/p XRT and implant ([facility])  . Sleep apnea, obstructive     uses CPAP nightly  . Anemia    . Thrombocytopenia   . Prostate cancer   . CHF (congestive heart failure)   . MI (myocardial infarction) 1988  . Arrhythmia     a fib  . COPD (chronic obstructive pulmonary disease)   . Vitamin B 12 deficiency   . AICD (automatic cardioverter/defibrillator) present   . GERD (gastroesophageal reflux disease)     Past Surgical History  Procedure Laterality Date  . Vasectomy    . Coronary artery bypass  graft  1988  . Implantable cardioverter defibrillator generator change  April 2014    Bi V RRR  . Insert / replace / remove pacemaker  2014  . Cardiac catheterization      Prior to Admission medications   Medication Sig Start Date End Date Taking? Authorizing Provider  allopurinol (ZYLOPRIM) 100 MG tablet Take 100 mg by mouth daily.   Yes Historical Provider, MD  Cetirizine HCl ([facility] ALLERGY RELIEF) 10 MG CAPS Take 1 capsule by mouth daily as needed (allergies).   Yes Historical Provider, MD  colchicine 0.6 MG tablet Take 0.6 mg by mouth daily as needed (gout flare).    Yes Historical Provider, MD  cyanocobalamin (,VITAMIN B-12,) 1000 MCG/ML injection Inject 1,000 mcg into the muscle every 30 (thirty) days.   Yes Historical Provider, MD  doxazosin (CARDURA) 4 MG tablet Take 4 mg by mouth at bedtime.   Yes Historical Provider, MD  ELIQUIS 5 MG TABS tablet TAKE 1 TABLET TWICE A DAY 03/14/15  Yes Crecencio Mc, MD  escitalopram (LEXAPRO) 10 MG tablet TAKE 1 TABLET DAILY 03/10/15  Yes Crecencio Mc, MD  esomeprazole (NEXIUM) 40 MG capsule Take 40 mg by mouth daily at 12 noon.   Yes Historical Provider, MD  furosemide (LASIX) 80 MG tablet TAKE 1 TABLET DAILY 04/28/15  Yes Crecencio Mc, MD  isosorbide mononitrate (IMDUR) 30 MG 24 hr tablet Take 30 mg by mouth daily. 10/08/14  Yes Historical Provider, MD  lisinopril (PRINIVIL,ZESTRIL) 5 MG tablet Take 2.5 mg by mouth 2 (two) times daily.   Yes Historical Provider, MD  metoprolol succinate (TOPROL-XL) 50 MG 24 hr tablet Take 100 mg by mouth  daily. Take with or immediately following a meal.   Yes Historical Provider, MD  potassium chloride SA (K-DUR,KLOR-CON) 20 MEQ tablet Take 1 tablet (20 mEq total) by mouth daily. 11/28/14  Yes Crecencio Mc, MD  allopurinol (ZYLOPRIM) 300 MG tablet Take 1 tablet (300 mg total) by mouth daily. Patient not taking: Reported on 05/28/2015 04/24/14   Wallene Huh, DPM  metoprolol succinate (TOPROL-XL) 100 MG 24 hr tablet TAKE 1 TABLET DAILY Patient not taking: Reported on 05/28/2015 05/28/15   Crecencio Mc, MD  spironolactone (ALDACTONE) 25 MG tablet Take 1 tablet (25 mg total) by mouth daily. Patient not taking: Reported on 05/29/2015 11/28/14   Crecencio Mc, MD    Allergies as of 05/22/2015 - Review Complete 05/20/2015  Allergen Reaction Noted  . No known allergies  10/29/2014    Family History  Problem Relation Age of Onset  . Hypertension Mother   . Hypertension Father   . Cancer Father     prostate, throat  . Emphysema Father   . Heart disease Maternal Grandmother   . Heart disease Paternal Grandmother     History   Social History  . Marital Status: Single    Spouse Name: [name]  . Number of Children: [name]  . Years of Education: [name]   Occupational History  . Not on file.   Social History Main Topics  . Smoking status: Never Smoker   . Smokeless tobacco: Never Used  . Alcohol Use: 1.0 oz/week    2 Standard drinks or equivalent per week  . Drug Use: No  . Sexual Activity: Not on file   Other Topics Concern  . Not on file   Social History Narrative    Review of Systems: See HPI, otherwise negative ROS  Physical Exam: BP 117/63  mmHg  Temp(Src) 97.7 F (36.5 C) (Tympanic)  Resp 16  Ht 6\' 3"  (1.905 m)  Wt 113.399 kg (250 lb)  BMI 31.25 kg/m2  SpO2 95% General:   Alert,  pleasant and cooperative in NAD Head:  Normocephalic and atraumatic. Neck:  Supple; no masses or thyromegaly. Lungs:  Clear throughout to auscultation.    Heart:  Regular rate and  rhythm. Abdomen:  Soft, nontender and nondistended. Normal bowel sounds, without guarding, and without rebound.   Neurologic:  Alert and  oriented x4;  grossly normal neurologically.  Impression/Plan: Rick Gilmore is here for an endoscopy and colonoscopy to be performed for heme positive stool, iron def anemia and personal history of colon polyps  Risks, benefits, limitations, and alternatives regarding  endoscopy and colonoscopy have been reviewed with the patient.  Questions have been answered.  All parties agreeable.   Gaylyn Cheers, MD  05/30/2015, 10:01 AM

## 2015-05-30 NOTE — Anesthesia Preprocedure Evaluation (Addendum)
Anesthesia Evaluation  Patient identified by MRN, date of birth, ID band Patient awake    Reviewed: Allergy & Precautions, NPO status , Patient's Chart, lab work & pertinent test results, reviewed documented beta blocker date and time   Airway Mallampati: II  TM Distance: >3 FB     Dental  (+) Chipped   Pulmonary shortness of breath and with exertion, sleep apnea and Continuous Positive Airway Pressure Ventilation , COPD breath sounds clear to auscultation  Pulmonary exam normal       Cardiovascular hypertension, + CAD, + Past MI and +CHF + dysrhythmias Atrial Fibrillation + Cardiac Defibrillator  EF 30-35%   Neuro/Psych negative neurological ROS  negative psych ROS   GI/Hepatic Neg liver ROS, GERD-  Medicated and Controlled,  Endo/Other  negative endocrine ROS  Renal/GU negative Renal ROS  negative genitourinary   Musculoskeletal negative musculoskeletal ROS (+)   Abdominal Normal abdominal exam  (+)   Peds negative pediatric ROS (+)  Hematology  (+) anemia , Hx of thrombocytopenia   Anesthesia Other Findings Med[employer] defif/Pacemaker  Reproductive/Obstetrics                            Anesthesia Physical Anesthesia Plan  ASA: IV  Anesthesia Plan: General   Post-op Pain Management:    Induction: Intravenous  Airway Management Planned: Nasal Cannula  Additional Equipment:   Intra-op Plan:   Post-operative Plan:   Informed Consent: I have reviewed the patients History and Physical, chart, labs and discussed the procedure including the risks, benefits and alternatives for the proposed anesthesia with the patient or authorized representative who has indicated his/her understanding and acceptance.   Dental advisory given  Plan Discussed with: CRNA and Surgeon  Anesthesia Plan Comments:         Anesthesia Quick Evaluation

## 2015-05-30 NOTE — Anesthesia Postprocedure Evaluation (Signed)
  Anesthesia Post-op Note  Patient: Rick Gilmore  Procedure(s) Performed: Procedure(s): COLONOSCOPY WITH PROPOFOL ([name]) ESOPHAGOGASTRODUODENOSCOPY (EGD) ([name])  Anesthesia type:General  Patient location: PACU  Post pain: Pain level controlled  Post assessment: Post-op Vital signs reviewed, Patient's Cardiovascular Status Stable, Respiratory Function Stable, Patent Airway and No signs of Nausea or vomiting  Post vital signs: Reviewed and stable  Last Vitals:  Filed Vitals:   05/30/15 1109  BP: 99/56  Pulse: 61  Temp: 35.9 C  Resp: 21    Level of consciousness: awake, alert  and patient cooperative  Complications: No apparent anesthesia complications

## 2015-06-02 ENCOUNTER — Encounter: Payer: Self-pay | Admitting: Unknown Physician Specialty

## 2015-06-02 ENCOUNTER — Encounter: Payer: Medicare Other | Admitting: Surgery

## 2015-06-02 DIAGNOSIS — Z872 Personal history of diseases of the skin and subcutaneous tissue: Secondary | ICD-10-CM | POA: Diagnosis not present

## 2015-06-02 DIAGNOSIS — Z09 Encounter for follow-up examination after completed treatment for conditions other than malignant neoplasm: Secondary | ICD-10-CM | POA: Diagnosis not present

## 2015-06-02 DIAGNOSIS — R600 Localized edema: Secondary | ICD-10-CM | POA: Diagnosis not present

## 2015-06-02 DIAGNOSIS — S81812A Laceration without foreign body, left lower leg, initial encounter: Secondary | ICD-10-CM | POA: Diagnosis not present

## 2015-06-02 DIAGNOSIS — J449 Chronic obstructive pulmonary disease, unspecified: Secondary | ICD-10-CM | POA: Diagnosis not present

## 2015-06-02 DIAGNOSIS — L97222 Non-pressure chronic ulcer of left calf with fat layer exposed: Secondary | ICD-10-CM | POA: Diagnosis not present

## 2015-06-02 DIAGNOSIS — I2510 Atherosclerotic heart disease of native coronary artery without angina pectoris: Secondary | ICD-10-CM | POA: Diagnosis not present

## 2015-06-02 DIAGNOSIS — I10 Essential (primary) hypertension: Secondary | ICD-10-CM | POA: Diagnosis not present

## 2015-06-03 LAB — HM COLONOSCOPY: HM Colonoscopy: NEGATIVE

## 2015-06-03 LAB — SURGICAL PATHOLOGY

## 2015-06-03 NOTE — Progress Notes (Signed)
Rick Gilmore, Rick Gilmore ([id number]) Visit Report for 06/02/2015 Chief Complaint Document Details Patient Name: Rick Gilmore, Rick Gilmore Date of Service: 06/02/2015 8:45 AM Medical Record Number: [id number] Patient Account Number: [id number] Date of Birth/Sex: May 15, 1941 (74 y.o. Male) Treating RN: Primary Care Physician: Rick Gilmore Other Clinician: Referring Physician: Deborra Gilmore Treating Physician/Extender: Rick Gilmore in Treatment: 11 Information Obtained from: Patient Chief Complaint Patient presents to the wound care center for a consult due non healing wound 74 year old gentleman comes with a history of having a injury to his left lower extremity on 03/04/15,which was seen in the ER about 10 days ago. 05/19/2015 -- he hit the same left lower extremity against a wooden plank and abraded this a few days ago. He has sustained a new superficial wound very close to the previous one. Electronic Signature(s) Signed: 06/02/2015 12:13:05 PM By: Rick Fudge MD, FACS Entered By: Rick Gilmore on 06/02/2015 08:30:05 Rick Gilmore, Rick Gilmore ([id number]) -------------------------------------------------------------------------------- HPI Details Patient Name: Rick Gilmore Date of Service: 06/02/2015 8:45 AM Medical Record Number: [id number] Patient Account Number: [id number] Date of Birth/Sex: Mar 09, 1941 (74 y.o. Male) Treating RN: Primary Care Physician: Rick Gilmore Other Clinician: Referring Physician: Deborra Gilmore Treating Physician/Extender: Rick Gilmore in Treatment: 11 History of Present Illness Location: left lower extremity Quality: Patient reports experiencing a dull pain to affected area(s). Severity: Patient states wound (s) are getting better. Duration: Patient has had the wound for < 2 weeks prior to presenting for treatment Timing: Pain in wound is Intermittent (comes and goes Context: The wound occurred when the patient after he had a fall and hurt himself. Modifying Factors:  Patient must stand for long periods while working Associated Signs and Symptoms: Patient reports presence of swelling HPI Description: 74 year old gentleman who has a past medical history significant for gout, edema of the lower limbs, anemia, hypertension, acid reflux, depression, COPD, coronary artery disease and history of sleep apnea. He has had a bypass surgery in 1988 and has had prostate cancer with radiation and seed implants in 2002. He is also known to have a pacemaker and defibrillator placed in 2014. he has been seen in the ER on 3 different occasions since the fall and they have been applying a dry dressing and some labs were done the last time around. No x-rays or ultrasound has been done. The patient is on an anticoagulant and this has not been held at any stage. During his ER notes from 03/10/2015 is noted that he had a wound on the left lower extremity which she's had for about 10 days now. The patient was noted to have extensive ecchymosis and tenderness and a laceration to the left lower extremity. Note is made of the fact that he had no signs of cellulitis and his labs were normal. He was given symptomatic treatment and asked to follow-up in the wound center. 03/24/2015 -- he has been doing fine and his dressing has been done by his family members and is at no issues. His treatment the blood thinner continuous. He will be going out of town this week and will be able to see as next week on Tuesday. 05/12/2015 -- it has been about 8 weeks since we are treating him with conservative therapy and is doing pretty well though there is some depth to the area and he continues to have to pack it. 05/19/2015 -- he inadvertently hit his left lower extremity against a wooden plank and had a fresh abrasion. Other than that he's been doing fine. Electronic Signature(s) Signed: 06/02/2015 12:13:05 PM  By: Rick Fudge MD, FACS Entered By: Rick Gilmore on 06/02/2015 08:30:10 Rick Gilmore, Rick Gilmore  ([id number]) -------------------------------------------------------------------------------- Physical Exam Details Patient Name: Rick Gilmore Date of Service: 06/02/2015 8:45 AM Medical Record Number: [id number] Patient Account Number: [id number] Date of Birth/Sex: 02-Dec-1941 (74 y.o. Male) Treating RN: Primary Care Physician: Rick Gilmore Other Clinician: Referring Physician: Deborra Gilmore Treating Physician/Extender: Rick Gilmore in Treatment: 11 Constitutional . Pulse regular. Respirations normal and unlabored. Afebrile. . Eyes Nonicteric. Reactive to light. Ears, Nose, Mouth, and Throat Lips, teeth, and gums WNL.Marland Kitchen Moist mucosa without lesions . Neck supple and nontender. No palpable supraclavicular or cervical adenopathy. Normal sized without goiter. Respiratory WNL. No retractions.. Cardiovascular Pedal Pulses WNL. No clubbing, cyanosis or edema. Musculoskeletal Adexa without tenderness or enlargement.. Digits and nails w/o clubbing, cyanosis, infection, petechiae, ischemia, or inflammatory conditions.. Integumentary (Hair, Skin) No suspicious lesions. wound is completely healed.. No crepitus or fluctuance. No peri-wound warmth or erythema. No masses.Marland Kitchen Psychiatric Judgement and insight Intact.. No evidence of depression, anxiety, or agitation.. Electronic Signature(s) Signed: 06/02/2015 12:13:05 PM By: Rick Fudge MD, FACS Entered By: Rick Gilmore on 06/02/2015 08:59:32 Rick Gilmore ([id number]) -------------------------------------------------------------------------------- Physician Orders Details Patient Name: Rick Gilmore Date of Service: 06/02/2015 8:45 AM Medical Record Number: [id number] Patient Account Number: [id number] Date of Birth/Sex: 1941-01-07 (74 y.o. Male) Treating RN: Cornell Barman Primary Care Physician: Rick Gilmore Other Clinician: Referring Physician: Deborra Gilmore Treating Physician/Extender: Rick Gilmore in Treatment:  11 Verbal / Phone Orders: Yes Clinician: Cornell Barman Read Back and Verified: Yes Diagnosis Coding ICD-10 Coding Code Description [phone] Non-pressure chronic ulcer of left calf with fat layer exposed S81.812A Laceration without foreign body, left lower leg, initial encounter S81.801A Unspecified open wound, right lower leg, initial encounter Discharge From [place] o Discharge from [place] complete Electronic Signature(s) Signed: 06/02/2015 12:13:05 PM By: Rick Fudge MD, FACS Signed: 06/02/2015 5:55:26 PM By: Gretta Cool, RN, BSN, Kim RN, BSN Entered By: Gretta Cool, RN, BSN, Kim on 06/02/2015 08:58:21 Rick Gilmore ([id number]) -------------------------------------------------------------------------------- Problem List Details Patient Name: Rick Gilmore Date of Service: 06/02/2015 8:45 AM Medical Record Number: [id number] Patient Account Number: [id number] Date of Birth/Sex: 06/15/1941 (74 y.o. Male) Treating RN: Primary Care Physician: Rick Gilmore Other Clinician: Referring Physician: Deborra Gilmore Treating Physician/Extender: Rick Gilmore in Treatment: 11 Active Problems ICD-10 Encounter Code Description Active Date Diagnosis L97.222 Non-pressure chronic ulcer of left calf with fat layer 03/17/2015 Yes exposed S81.812A Laceration without foreign body, left lower leg, initial 03/17/2015 Yes encounter S81.801A Unspecified open wound, right lower leg, initial encounter 05/19/2015 Yes Inactive Problems Resolved Problems Electronic Signature(s) Signed: 06/02/2015 12:13:05 PM By: Rick Fudge MD, FACS Entered By: Rick Gilmore on 06/02/2015 08:29:57 Rick Gilmore ([id number]) -------------------------------------------------------------------------------- Progress Note Details Patient Name: Rick Gilmore Date of Service: 06/02/2015 8:45 AM Medical Record Number: [id number] Patient Account Number: [id number] Date of Birth/Sex: [birth year] (74 y.o.  Male) Treating RN: Primary Care Physician: Rick Gilmore Other Clinician: Referring Physician: Deborra Gilmore Treating Physician/Extender: Rick Gilmore in Treatment: 11 Subjective Chief Complaint Information obtained from Patient Patient presents to the wound care center for a consult due non healing wound 74 year old gentleman comes with a history of having a injury to his left lower extremity on 03/04/15,which was seen in the ER about 10 days ago. 05/19/2015 -- he hit the same left lower extremity against a wooden plank and abraded this a few days ago. He has sustained a new superficial wound very close to the previous one. History of Present Illness (HPI)  The following HPI elements were documented for the patient's wound: Location: left lower extremity Quality: Patient reports experiencing a dull pain to affected area(s). Severity: Patient states wound (s) are getting better. Duration: Patient has had the wound for < 2 weeks prior to presenting for treatment Timing: Pain in wound is Intermittent (comes and goes Context: The wound occurred when the patient after he had a fall and hurt himself. Modifying Factors: Patient must stand for long periods while working Associated Signs and Symptoms: Patient reports presence of swelling 74 year old gentleman who has a past medical history significant for gout, edema of the lower limbs, anemia, hypertension, acid reflux, depression, COPD, coronary artery disease and history of sleep apnea. He has had a bypass surgery in 1988 and has had prostate cancer with radiation and seed implants in 2002. He is also known to have a pacemaker and defibrillator placed in 2014. he has been seen in the ER on 3 different occasions since the fall and they have been applying a dry dressing and some labs were done the last time around. No x-rays or ultrasound has been done. The patient is on an anticoagulant and this has not been held at any stage. During  his ER notes from 03/10/2015 is noted that he had a wound on the left lower extremity which she's had for about 10 days now. The patient was noted to have extensive ecchymosis and tenderness and a laceration to the left lower extremity. Note is made of the fact that he had no signs of cellulitis and his labs were normal. He was given symptomatic treatment and asked to follow-up in the wound center. 03/24/2015 -- he has been doing fine and his dressing has been done by his family members and is at no issues. His treatment the blood thinner continuous. He will be going out of town this week and will be able to see as next week on Tuesday. 05/12/2015 -- it has been about 8 weeks since we are treating him with conservative therapy and is doing pretty well though there is some depth to the area and he continues to have to pack it. 05/19/2015 -- he inadvertently hit his left lower extremity against a wooden plank and had a fresh abrasion. Schommer, [place] ([id number]) Other than that he's been doing fine. Objective Constitutional Pulse regular. Respirations normal and unlabored. Afebrile. Vitals Time Taken: 8:48 AM, Height: 75 in, Weight: 252 lbs, BMI: 31.5, Temperature: 98.0 F, Pulse: 64 bpm, Respiratory Rate: 18 breaths/min, Blood Pressure: 110/46 mmHg. Eyes Nonicteric. Reactive to light. Ears, Nose, Mouth, and Throat Lips, teeth, and gums WNL.Marland Kitchen Moist mucosa without lesions . Neck supple and nontender. No palpable supraclavicular or cervical adenopathy. Normal sized without goiter. Respiratory WNL. No retractions.. Cardiovascular Pedal Pulses WNL. No clubbing, cyanosis or edema. Musculoskeletal Adexa without tenderness or enlargement.. Digits and nails w/o clubbing, cyanosis, infection, petechiae, ischemia, or inflammatory conditions.Marland Kitchen Psychiatric Judgement and insight Intact.. No evidence of depression, anxiety, or agitation.. Integumentary (Hair, Skin) No suspicious lesions. wound is  completely healed.. No crepitus or fluctuance. No peri-wound warmth or erythema. No masses.. Wound #1 status is Healed - Epithelialized. Original cause of wound was Trauma. The wound is located on the Left Lower Leg. The wound measures 0cm length x 0cm width x 0cm depth; 0cm^2 area and 0cm^3 volume. The wound is limited to skin breakdown. There is a none present amount of drainage noted. The wound margin is distinct with the outline attached to the wound base. There is large (  67-100%) pink granulation within the wound bed. There is no necrotic tissue within the wound bed. The periwound skin appearance had no abnormalities noted for moisture. The periwound skin appearance exhibited: Induration, St[place]one, [place] ([id number]) Localized Edema, Hemosiderin Staining. The periwound skin appearance did not exhibit: Callus, Crepitus, Excoriation, Fluctuance, Friable, Rash, Scarring, Erythema. Periwound temperature was noted as No Abnormality. Assessment Active Problems ICD-10 L97.222 - Non-pressure chronic ulcer of left calf with fat layer exposed S81.812A - Laceration without foreign body, left lower leg, initial encounter S81.801A - Unspecified open wound, right lower leg, initial encounter The wound is completely healed and there is good resolution of his hematoma. His discharge from the wound care services and will be seen back on a when necessary basis. Plan Discharge From [facility]: Discharge from [place] - Treatment complete The wound is completely healed and there is good resolution of his hematoma. His discharge from the wound care services and will be seen back on a when necessary basis. Electronic Signature(s) Signed: 06/02/2015 12:13:05 PM By: Rick Fudge MD, FACS Entered By: Rick Gilmore on 06/02/2015 09:00:13 Rick Gilmore ([id number]) -------------------------------------------------------------------------------- SuperBill Details Patient Name: Rick Gilmore Date  of Service: 06/02/2015 Medical Record Number: [id number] Patient Account Number: [id number] Date of Birth/Sex: Apr 05, 1941 (74 y.o. Male) Treating RN: Primary Care Physician: Rick Gilmore Other Clinician: Referring Physician: Deborra Gilmore Treating Physician/Extender: Rick Gilmore in Treatment: 11 Diagnosis Coding ICD-10 Codes Code Description [phone] Non-pressure chronic ulcer of left calf with fat layer exposed S81.812A Laceration without foreign body, left lower leg, initial encounter S81.801A Unspecified open wound, right lower leg, initial encounter Facility Procedures CPT4 Code: [id number] Description: [id number] - WOUND CARE VISIT-LEV 1 EST PT Modifier: Quantity: 1 Physician Procedures CPT4 Code Description: [id number] - WC PHYS LEVEL 3 - EST PT ICD-10 Description Diagnosis L97.222 Non-pressure chronic ulcer of left calf with fat lay S81.812A Laceration without foreign body, left lower leg, ini Modifier: er exposed tial encounter Quantity: 1 Electronic Signature(s) Signed: 06/02/2015 12:13:05 PM By: Rick Fudge MD, FACS Entered By: Rick Gilmore on 06/02/2015 09:00:27

## 2015-06-03 NOTE — Progress Notes (Signed)
Rick Gilmore ([id number]) Visit Report for 06/02/2015 Arrival Information Details Patient Name: Rick Gilmore, Rick Gilmore Date of Service: 06/02/2015 8:45 AM Medical Record Number: [id number] Patient Account Number: [id number] Date of Birth/Sex: June 27, 1941 (74 y.o. Male) Treating RN: Cornell Barman Primary Care Physician: Deborra Medina Other Clinician: Referring Physician: Deborra Medina Treating Physician/Extender: [name] in Treatment: 11 Visit Information History Since Last Visit Added or deleted any medications: No Patient Arrived: Ambulatory Any new allergies or adverse reactions: No Arrival Time: 08:45 Had a fall or experienced change in No Accompanied By: self activities of daily living that may affect Transfer Assistance: Manual risk of falls: Patient Identification Verified: Yes Signs or symptoms of abuse/neglect since last No Secondary Verification Process Yes visito Completed: Hospitalized since last visit: No Patient Requires Transmission- No Has Dressing in Place as Prescribed: Yes Based Precautions: Pain Present Now: No Patient Has Alerts: Yes Patient Alerts: Patient on Blood Thinner Electronic Signature(s) Signed: 06/02/2015 5:55:26 PM By: Gretta Cool, RN, BSN, Kim RN, BSN Entered By: Gretta Cool, RN, BSN, Kim on 06/02/2015 08:46:59 Rick Gilmore ([id number]) -------------------------------------------------------------------------------- Clinic Level of Care Assessment Details Patient Name: Rick Gilmore Date of Service: 06/02/2015 8:45 AM Medical Record Number: [id number] Patient Account Number: [id number] Date of Birth/Sex: [birth year] (73 y.o. Male) Treating RN: Cornell Barman Primary Care Physician: Deborra Medina Other Clinician: Referring Physician: Deborra Medina Treating Physician/Extender: [name] in Treatment: 11 Clinic Level of Care Assessment Items TOOL 4 Quantity Score []  - Use when only an EandM is performed on FOLLOW-UP visit 0 ASSESSMENTS -  Nursing Assessment / Reassessment []  - Reassessment of Co-morbidities (includes updates in patient status) 0 X - Reassessment of Adherence to Treatment Plan 1 5 ASSESSMENTS - Wound and Skin Assessment / Reassessment X - Simple Wound Assessment / Reassessment - one wound 1 5 []  - Complex Wound Assessment / Reassessment - multiple wounds 0 []  - Dermatologic / Skin Assessment (not related to wound area) 0 ASSESSMENTS - Focused Assessment []  - Circumferential Edema Measurements - multi extremities 0 []  - Nutritional Assessment / Counseling / Intervention 0 []  - Lower Extremity Assessment (monofilament, tuning fork, pulses) 0 []  - Peripheral Arterial Disease Assessment (using hand held doppler) 0 ASSESSMENTS - Ostomy and/or Continence Assessment and Care []  - Incontinence Assessment and Management 0 []  - Ostomy Care Assessment and Management (repouching, etc.) 0 PROCESS - Coordination of Care []  - Simple Patient / Family Education for ongoing care 0 []  - Complex (extensive) Patient / Family Education for ongoing care 0 []  - Staff obtains Programmer, systems, Records, Test Results / Process Orders 0 []  - Staff telephones HHA, Nursing Homes / Clarify orders / etc 0 []  - Routine Transfer to another Facility (non-emergent condition) 0 Dickens, Yousaf ([id number]) []  - Routine Hospital Admission (non-emergent condition) 0 []  - New Admissions / Biomedical engineer / Ordering NPWT, Apligraf, etc. 0 []  - Emergency Hospital Admission (emergent condition) 0 X - Simple Discharge Coordination 1 10 []  - Complex (extensive) Discharge Coordination 0 PROCESS - Special Needs []  - Pediatric / Minor Patient Management 0 []  - Isolation Patient Management 0 []  - Hearing / Language / Visual special needs 0 []  - Assessment of Community assistance (transportation, D/C planning, etc.) 0 []  - Additional assistance / Altered mentation 0 []  - Support Surface(s) Assessment (bed, cushion, seat, etc.) 0 INTERVENTIONS - Wound  Cleansing / Measurement []  - Simple Wound Cleansing - one wound 0 []  - Complex Wound Cleansing - multiple wounds 0 X - Wound Imaging (photographs - any number of wounds)  1 5 []  - Wound Tracing (instead of photographs) 0 []  - Simple Wound Measurement - one wound 0 []  - Complex Wound Measurement - multiple wounds 0 INTERVENTIONS - Wound Dressings []  - Small Wound Dressing one or multiple wounds 0 []  - Medium Wound Dressing one or multiple wounds 0 []  - Large Wound Dressing one or multiple wounds 0 []  - Application of Medications - topical 0 []  - Application of Medications - injection 0 INTERVENTIONS - Miscellaneous []  - External ear exam 0 Bukhari, Larance ([id number]) []  - Specimen Collection (cultures, biopsies, blood, body fluids, etc.) 0 []  - Specimen(s) / Culture(s) sent or taken to Lab for analysis 0 []  - Patient Transfer (multiple staff / Civil Service fast streamer / Similar devices) 0 []  - Simple Staple / Suture removal (25 or less) 0 []  - Complex Staple / Suture removal (26 or more) 0 []  - Hypo / Hyperglycemic Management (close monitor of Blood Glucose) 0 []  - Ankle / Brachial Index (ABI) - do not check if billed separately 0 X - Vital Signs 1 5 Has the patient been seen at the hospital within the last three years: Yes Total Score: 30 Level Of Care: New/Established - Level 1 Electronic Signature(s) Signed: 06/02/2015 5:55:26 PM By: Gretta Cool, RN, BSN, Kim RN, BSN Entered By: Gretta Cool, RN, BSN, Kim on 06/02/2015 08:58:57 Rick Gilmore ([id number]) -------------------------------------------------------------------------------- Encounter Discharge Information Details Patient Name: Rick Gilmore Date of Service: 06/02/2015 8:45 AM Medical Record Number: [id number] Patient Account Number: [id number] Date of Birth/Sex: 11-17-1941 (74 y.o. Male) Treating RN: Primary Care Physician: Deborra Medina Other Clinician: Referring Physician: Deborra Medina Treating Physician/Extender: [name] in  Treatment: 11 Encounter Discharge Information Items Discharge Pain Level: 0 Discharge Condition: Stable Ambulatory Status: Ambulatory Discharge Destination: Home Transportation: Private Auto Accompanied By: self Schedule Follow-up Appointment: No Medication Reconciliation completed and provided to Patient/Care Yes Jereme Loren: Provided on Clinical Summary of Care: 06/02/2015 Form Type Recipient Paper Patient [facility] Electronic Signature(s) Signed: 06/02/2015 9:00:03 AM By: Ruthine Dose Previous Signature: 06/02/2015 8:58:41 AM Version By: Ruthine Dose Entered By: Ruthine Dose on 06/02/2015 09:00:03 Rick Gilmore ([id number]) -------------------------------------------------------------------------------- Lower Extremity Assessment Details Patient Name: Rick Gilmore Date of Service: 06/02/2015 8:45 AM Medical Record Number: [id number] Patient Account Number: [id number] Date of Birth/Sex: 1941-10-10 (73 y.o. Male) Treating RN: Cornell Barman Primary Care Physician: Deborra Medina Other Clinician: Referring Physician: Deborra Medina Treating Physician/Extender: [name] in Treatment: 11 Vascular Assessment Pulses: Posterior Tibial Palpable: [Left:Yes] Dorsalis Pedis Palpable: [Left:Yes] Extremity colors, hair growth, and conditions: Extremity Color: [Left:Normal] Hair Growth on Extremity: [Left:Yes] Temperature of Extremity: [Left:Warm] Capillary Refill: [Left:< 3 seconds] Toe Nail Assessment Left: Right: Thick: No Discolored: No Deformed: No Improper Length and Hygiene: No Electronic Signature(s) Signed: 06/02/2015 5:55:26 PM By: Gretta Cool, RN, BSN, Kim RN, BSN Entered By: Gretta Cool, RN, BSN, Kim on 06/02/2015 08:53:20 Ewan, [place] ([id number]) -------------------------------------------------------------------------------- Multi Wound Chart Details Patient Name: Rick Gilmore Date of Service: 06/02/2015 8:45 AM Medical Record Number: [id number] Patient Account Number:  [id number] Date of Birth/Sex: Jun 22, 1941 (73 y.o. Male) Treating RN: Cornell Barman Primary Care Physician: Deborra Medina Other Clinician: Referring Physician: Deborra Medina Treating Physician/Extender: [name] in Treatment: 11 Vital Signs Height(in): 75 Pulse(bpm): 64 Weight(lbs): 252 Blood Pressure 110/46 (mmHg): Body Mass Index(BMI): 31 Temperature(F): 98.0 Respiratory Rate 18 (breaths/min): Photos: [1:No Photos] [[name]:[name]] Wound Location: [1:Left Lower Leg] [[name]:[name]] Wounding Event: [1:Trauma] [[name]:[name]] Primary Etiology: [1:Trauma, Other] [[name]:[name]] Comorbid History: [1:Anemia, Chronic Obstructive Pulmonary Disease (COPD), Sleep Apnea, Arrhythmia, Congestive Heart Failure, Coronary Artery Disease,  Hypertension, Myocardial Infarction, Gout, Received Radiation] [[name]:[name]] Date Acquired: [1:03/04/2015] [[name]:[name]] Weeks of Treatment: [1:11] [[name]:[name]] Wound Status: [1:Healed - Epithelialized] [[name]:[name]] Measurements L x W x D 0x0x0 [[name]:[name]] (cm) Area (cm) : [1:0] [[name]:[name]] Volume (cm) : [1:0] [[name]:[name]] % Reduction in Area: [1:100.00%] [[name]:[name]] % Reduction in Volume: 100.00% [[name]:[name]] Classification: [1:Full Thickness Without Exposed Support Structures] [[name]:[name]] Exudate Amount: [1:None Present] [[name]:[name]] Wound Margin: [1:Distinct, outline attached [name]] Granulation Amount: [1:Large (67-100%)] [[name]:[name]] Granulation Quality: [1:Pink] [[name]:[name]] Necrotic Amount: [1:None Present (0%)] [[name]:[name]] Exposed Structures: Fascia: No [name] [name] Fat: No Tendon: No Muscle: No Joint: No Bone: No Limited to Skin Breakdown Epithelialization: Small (1-33%) [name] [name] Periwound Skin Texture: Edema: Yes [name] [name] Induration: Yes Excoriation: No Callus: No Crepitus: No Fluctuance: No Friable: No Rash: No Scarring: No Periwound Skin No Abnormalities Noted [name] [name] Moisture: Periwound Skin Color: Hemosiderin Staining: Yes [name] [name] Erythema: No Temperature: No Abnormality [name]  [name] Tenderness on No [name] [name] Palpation: Wound Preparation: Ulcer Cleansing: Not [name] [name] Cleansed Topical Anesthetic Applied: None Treatment Notes Electronic Signature(s) Signed: 06/02/2015 5:55:26 PM By: Gretta Cool, RN, BSN, Kim RN, BSN Entered By: Gretta Cool, RN, BSN, Kim on 06/02/2015 08:57:46 Rick Gilmore ([id number]) -------------------------------------------------------------------------------- Multi-Disciplinary Care Plan Details Patient Name: Rick Gilmore Date of Service: 06/02/2015 8:45 AM Medical Record Number: [id number] Patient Account Number: [id number] Date of Birth/Sex: 03/01/1941 (73 y.o. Male) Treating RN: Cornell Barman Primary Care Physician: Deborra Medina Other Clinician: Referring Physician: Deborra Medina Treating Physician/Extender: [name] in Treatment: 11 Active Inactive Electronic Signature(s) Signed: 06/02/2015 5:55:26 PM By: Gretta Cool, RN, BSN, Kim RN, BSN Entered By: Gretta Cool, RN, BSN, Kim on 06/02/2015 08:57:24 Rick Gilmore ([id number]) -------------------------------------------------------------------------------- Pain Assessment Details Patient Name: Rick Gilmore Date of Service: 06/02/2015 8:45 AM Medical Record Number: [id number] Patient Account Number: [id number] Date of Birth/Sex: 25-Aug-1941 (73 y.o. Male) Treating RN: Cornell Barman Primary Care Physician: Deborra Medina Other Clinician: Referring Physician: Deborra Medina Treating Physician/Extender: [name] in Treatment: 11 Active Problems Location of Pain Severity and Description of Pain Patient Has Paino No Site Locations Pain Management and Medication Current Pain Management: Electronic Signature(s) Signed: 06/02/2015 5:55:26 PM By: Gretta Cool, RN, BSN, Kim RN, BSN Entered By: Gretta Cool, RN, BSN, Kim on 06/02/2015 08:48:52 Rick Gilmore ([id number]) -------------------------------------------------------------------------------- Patient/Caregiver Education Details Patient Name: Rick Gilmore Date of Service: 06/02/2015 8:45 AM Medical Record Number: [id number] Patient Account Number: [id number] Date of Birth/Gender: 1941/06/17 (73 y.o. Male) Treating RN: Cornell Barman Primary Care Physician: Deborra Medina Other Clinician: Referring Physician: Deborra Medina Treating Physician/Extender: [name] in Treatment: 11 Education Assessment Education Provided To: Patient and Caregiver Education Topics Provided Wound/Skin Impairment: Handouts: Other: healed, call with any questions or concerns Electronic Signature(s) Signed: 06/02/2015 5:55:26 PM By: Gretta Cool, RN, BSN, Kim RN, BSN Entered By: Gretta Cool, RN, BSN, Kim on 06/02/2015 09:00:10 Rick Gilmore ([id number]) -------------------------------------------------------------------------------- Wound Assessment Details Patient Name: Rick Gilmore Date of Service: 06/02/2015 8:45 AM Medical Record Number: [id number] Patient Account Number: [id number] Date of Birth/Sex: 04-Jul-1941 (74 y.o. Male) Treating RN: Cornell Barman Primary Care Physician: Deborra Medina Other Clinician: Referring Physician: Deborra Medina Treating Physician/Extender: [name] in Treatment: 11 Wound Status Wound Number: 1 Primary Trauma, Other Etiology: Wound Location: Left Lower Leg Wound Healed - Epithelialized Wounding Event: Trauma Status: Date Acquired: 03/04/2015 Comorbid Anemia, Chronic Obstructive Pulmonary Weeks Of Treatment: 11 History: Disease (COPD), Sleep Apnea, Clustered Wound: No Arrhythmia, Congestive Heart Failure, Coronary Artery Disease, Hypertension, Myocardial Infarction, Gout, Received Radiation Photos Photo Uploaded By: Gretta Cool RN, BSN, Kim on  06/02/2015 14:21:19 Wound Measurements Length: (cm) 0 Width: (cm) 0 Depth: (cm) 0 Area: (cm) 0 Volume: (cm) 0 % Reduction in Area: 100% % Reduction in Volume: 100% Epithelialization: Small (1-33%) Wound Description Full Thickness Without Exposed Foul Odor  Afte Classification: Support Structures Wound Margin: Distinct, outline attached Exudate None Present Amount: r Cleansing: No Wound Bed Granulation Amount: Large (67-100%) Exposed Structure Willner, Korbin ([id number]) Granulation Quality: Pink Fascia Exposed: No Necrotic Amount: None Present (0%) Fat Layer Exposed: No Tendon Exposed: No Muscle Exposed: No Joint Exposed: No Bone Exposed: No Limited to Skin Breakdown Periwound Skin Texture Texture Color No Abnormalities Noted: No No Abnormalities Noted: No Callus: No Erythema: No Crepitus: No Hemosiderin Staining: Yes Excoriation: No Temperature / Pain Fluctuance: No Temperature: No Abnormality Friable: No Induration: Yes Localized Edema: Yes Rash: No Scarring: No Moisture No Abnormalities Noted: Yes Wound Preparation Ulcer Cleansing: Not Cleansed Topical Anesthetic Applied: None Electronic Signature(s) Signed: 06/02/2015 5:55:26 PM By: Gretta Cool, RN, BSN, Kim RN, BSN Entered By: Gretta Cool, RN, BSN, Kim on 06/02/2015 08:56:23 Rick Gilmore ([id number]) -------------------------------------------------------------------------------- [place] Details Patient Name: Rick Gilmore Date of Service: 06/02/2015 8:45 AM Medical Record Number: [id number] Patient Account Number: [id number] Date of Birth/Sex: February 26, 1941 (73 y.o. Male) Treating RN: Cornell Barman Primary Care Physician: Deborra Medina Other Clinician: Referring Physician: Deborra Medina Treating Physician/Extender: [name] in Treatment: 11 Vital Signs Time Taken: 08:48 Temperature (F): 98.0 Height (in): 75 Pulse (bpm): 64 Weight (lbs): 252 Respiratory Rate (breaths/min): 18 Body Mass Index (BMI): 31.5 Blood Pressure (mmHg): 110/46 Reference Range: 80 - 120 mg / dl Electronic Signature(s) Signed: 06/02/2015 5:55:26 PM By: Gretta Cool, RN, BSN, Kim RN, BSN Entered By: Gretta Cool, RN, BSN, Kim on 06/02/2015 08:49:42

## 2015-06-04 DIAGNOSIS — D126 Benign neoplasm of colon, unspecified: Secondary | ICD-10-CM

## 2015-06-06 ENCOUNTER — Other Ambulatory Visit: Payer: Self-pay | Admitting: Internal Medicine

## 2015-06-30 ENCOUNTER — Telehealth: Payer: Self-pay | Admitting: *Deleted

## 2015-06-30 NOTE — Telephone Encounter (Signed)
[facility] request refill of Allopurinol 300mg  #90 +3 refills.  Pt has not been evaluated in this office in over 3 years, and needs to monitored for long-term medication therapy. Denied.

## 2015-07-04 ENCOUNTER — Telehealth: Payer: Self-pay | Admitting: *Deleted

## 2015-07-04 NOTE — Telephone Encounter (Signed)
"  My prescription for Allopurinol has ran out.  They said Dr. Paulla Dolly wants to see me.  I haven't had any problems since last year.  This is just a maintenance drug.  Do I need to see him, get it refilled or just stop it?  Give me a call.  Thank you, I'll look forward to talking to you.

## 2015-07-07 NOTE — Telephone Encounter (Signed)
Called pt-let him know appt was needed prior to appt and offered to get him scheduled. He would like to see Dr. Paulla Dolly in the [facility] office. He states that he would call back next week to schedule.

## 2015-07-16 DIAGNOSIS — I5022 Chronic systolic (congestive) heart failure: Secondary | ICD-10-CM | POA: Diagnosis not present

## 2015-07-16 DIAGNOSIS — I482 Chronic atrial fibrillation: Secondary | ICD-10-CM | POA: Diagnosis not present

## 2015-07-16 DIAGNOSIS — I25709 Atherosclerosis of coronary artery bypass graft(s), unspecified, with unspecified angina pectoris: Secondary | ICD-10-CM | POA: Diagnosis not present

## 2015-07-16 DIAGNOSIS — I4430 Unspecified atrioventricular block: Secondary | ICD-10-CM | POA: Diagnosis not present

## 2015-07-21 ENCOUNTER — Encounter: Payer: Self-pay | Admitting: Podiatry

## 2015-07-21 ENCOUNTER — Ambulatory Visit (INDEPENDENT_AMBULATORY_CARE_PROVIDER_SITE_OTHER): Payer: Medicare Other | Admitting: Podiatry

## 2015-07-21 VITALS — BP 108/62 | HR 84 | Resp 16

## 2015-07-21 DIAGNOSIS — M1000 Idiopathic gout, unspecified site: Secondary | ICD-10-CM

## 2015-07-21 DIAGNOSIS — M779 Enthesopathy, unspecified: Secondary | ICD-10-CM | POA: Diagnosis not present

## 2015-07-21 DIAGNOSIS — I255 Ischemic cardiomyopathy: Secondary | ICD-10-CM

## 2015-07-21 MED ORDER — ALLOPURINOL 100 MG PO TABS: 100.0000 mg | ORAL_TABLET | Freq: Every day | ORAL | Status: DC

## 2015-07-21 NOTE — Progress Notes (Signed)
   Subjective:    Patient ID: Rick Gilmore, male    DOB: 20-Dec-1940, 74 y.o.   MRN: [id number]  HPI  Pt presents wanting a refill on his allopurinol. He denies any pain or issues at this time  Review of Systems  All other systems reviewed and are negative.      Objective:   Physical Exam        Assessment & Plan:

## 2015-07-21 NOTE — Progress Notes (Signed)
Subjective:     Patient ID: Rick Gilmore, male   DOB: 10/27/1941, 74 y.o.   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 Patient wants to undergo this treatment and at this time will be kept on allopurinol 100 mg daily with patient to snow if symptoms persist. Reappoint in the next several years to reevaluate

## 2015-07-22 NOTE — Telephone Encounter (Signed)
Please advise do you want pt on Alloupurinol 100mg  or 300mg .

## 2015-07-29 DIAGNOSIS — I480 Paroxysmal atrial fibrillation: Secondary | ICD-10-CM | POA: Diagnosis not present

## 2015-08-05 ENCOUNTER — Other Ambulatory Visit: Payer: Self-pay | Admitting: Internal Medicine

## 2015-08-13 DIAGNOSIS — I482 Chronic atrial fibrillation: Secondary | ICD-10-CM | POA: Diagnosis not present

## 2015-08-13 DIAGNOSIS — Z95810 Presence of automatic (implantable) cardiac defibrillator: Secondary | ICD-10-CM | POA: Diagnosis not present

## 2015-08-13 DIAGNOSIS — I25810 Atherosclerosis of coronary artery bypass graft(s) without angina pectoris: Secondary | ICD-10-CM | POA: Diagnosis not present

## 2015-08-13 DIAGNOSIS — I5022 Chronic systolic (congestive) heart failure: Secondary | ICD-10-CM | POA: Diagnosis not present

## 2015-08-24 ENCOUNTER — Other Ambulatory Visit: Payer: Self-pay | Admitting: Internal Medicine

## 2015-09-02 ENCOUNTER — Other Ambulatory Visit: Payer: Self-pay | Admitting: Internal Medicine

## 2015-09-02 ENCOUNTER — Ambulatory Visit (INDEPENDENT_AMBULATORY_CARE_PROVIDER_SITE_OTHER): Payer: Medicare Other

## 2015-09-02 DIAGNOSIS — Z23 Encounter for immunization: Secondary | ICD-10-CM | POA: Diagnosis not present

## 2015-09-02 DIAGNOSIS — E538 Deficiency of other specified B group vitamins: Secondary | ICD-10-CM | POA: Diagnosis not present

## 2015-09-02 MED ORDER — CYANOCOBALAMIN 1000 MCG/ML IJ SOLN
1000.0000 ug | Freq: Once | INTRAMUSCULAR | Status: AC
  Administered 2015-09-02: 1000 ug via INTRAMUSCULAR

## 2015-09-02 NOTE — Progress Notes (Signed)
Patient came in for b12 injection.  Received in Left deltoid.  Patient tolerated well.  Patient also received a flu shot.

## 2015-09-03 ENCOUNTER — Inpatient Hospital Stay: Payer: Medicare Other | Attending: Internal Medicine

## 2015-09-03 ENCOUNTER — Inpatient Hospital Stay (HOSPITAL_BASED_OUTPATIENT_CLINIC_OR_DEPARTMENT_OTHER): Payer: Medicare Other | Admitting: Oncology

## 2015-09-03 ENCOUNTER — Inpatient Hospital Stay: Payer: Medicare Other

## 2015-09-03 VITALS — BP 105/64 | HR 83 | Temp 96.2°F | Resp 16 | Wt 256.8 lb

## 2015-09-03 DIAGNOSIS — Z8546 Personal history of malignant neoplasm of prostate: Secondary | ICD-10-CM

## 2015-09-03 DIAGNOSIS — D509 Iron deficiency anemia, unspecified: Secondary | ICD-10-CM | POA: Diagnosis not present

## 2015-09-03 DIAGNOSIS — I255 Ischemic cardiomyopathy: Secondary | ICD-10-CM | POA: Insufficient documentation

## 2015-09-03 DIAGNOSIS — I4891 Unspecified atrial fibrillation: Secondary | ICD-10-CM

## 2015-09-03 DIAGNOSIS — G4733 Obstructive sleep apnea (adult) (pediatric): Secondary | ICD-10-CM | POA: Diagnosis not present

## 2015-09-03 DIAGNOSIS — I2510 Atherosclerotic heart disease of native coronary artery without angina pectoris: Secondary | ICD-10-CM | POA: Insufficient documentation

## 2015-09-03 DIAGNOSIS — Z7901 Long term (current) use of anticoagulants: Secondary | ICD-10-CM

## 2015-09-03 DIAGNOSIS — I10 Essential (primary) hypertension: Secondary | ICD-10-CM

## 2015-09-03 DIAGNOSIS — E538 Deficiency of other specified B group vitamins: Secondary | ICD-10-CM | POA: Insufficient documentation

## 2015-09-03 DIAGNOSIS — J449 Chronic obstructive pulmonary disease, unspecified: Secondary | ICD-10-CM | POA: Diagnosis not present

## 2015-09-03 DIAGNOSIS — Z950 Presence of cardiac pacemaker: Secondary | ICD-10-CM | POA: Insufficient documentation

## 2015-09-03 DIAGNOSIS — E785 Hyperlipidemia, unspecified: Secondary | ICD-10-CM | POA: Insufficient documentation

## 2015-09-03 DIAGNOSIS — D696 Thrombocytopenia, unspecified: Secondary | ICD-10-CM

## 2015-09-03 DIAGNOSIS — K219 Gastro-esophageal reflux disease without esophagitis: Secondary | ICD-10-CM | POA: Insufficient documentation

## 2015-09-03 DIAGNOSIS — Z79899 Other long term (current) drug therapy: Secondary | ICD-10-CM | POA: Diagnosis not present

## 2015-09-03 LAB — IRON AND TIBC
Iron: 76 ug/dL (ref 45–182)
Saturation Ratios: 22 % (ref 17.9–39.5)
TIBC: 354 ug/dL (ref 250–450)
UIBC: 278 ug/dL

## 2015-09-03 LAB — CBC WITH DIFFERENTIAL/PLATELET
Basophils Absolute: 0.1 10*3/uL (ref 0–0.1)
Basophils Relative: 1 %
Eosinophils Absolute: 0.1 10*3/uL (ref 0–0.7)
Eosinophils Relative: 1 %
HCT: 36.6 % — ABNORMAL LOW (ref 40.0–52.0)
Hemoglobin: 12.4 g/dL — ABNORMAL LOW (ref 13.0–18.0)
Lymphocytes Relative: 20 %
Lymphs Abs: 1.3 10*3/uL (ref 1.0–3.6)
MCH: 32.2 pg (ref 26.0–34.0)
MCHC: 33.8 g/dL (ref 32.0–36.0)
MCV: 95.3 fL (ref 80.0–100.0)
Monocytes Absolute: 0.7 10*3/uL (ref 0.2–1.0)
Monocytes Relative: 12 %
Neutro Abs: 4.3 10*3/uL (ref 1.4–6.5)
Neutrophils Relative %: 66 %
Platelets: 113 10*3/uL — ABNORMAL LOW (ref 150–440)
RBC: 3.84 MIL/uL — ABNORMAL LOW (ref 4.40–5.90)
RDW: 15 % — ABNORMAL HIGH (ref 11.5–14.5)
WBC: 6.5 10*3/uL (ref 3.8–10.6)

## 2015-09-03 LAB — FERRITIN: Ferritin: 211 ng/mL (ref 24–336)

## 2015-09-08 ENCOUNTER — Other Ambulatory Visit: Payer: Self-pay | Admitting: Internal Medicine

## 2015-09-14 NOTE — Progress Notes (Signed)
Rick Gilmore  [phone] [phone]  ID: Rick Gilmore OB: Mar 14, 1941  MR#: [id number]  [id number]  Patient Care Team: Crecencio Mc, MD as PCP - General (Internal Medicine) Manya Silvas, MD (Gastroenterology) Corey Skains, MD as Consulting Physician (Cardiology)  CHIEF COMPLAINT:  Chief Complaint  Patient presents with  . Anemia    INTERVAL HISTORY: Patient returns to clinic today for repeat laboratory work and further evaluation. He continues to feel well and is asymptomatic. He does not complain of weakness and fatigue today.  He has a good appetite and denies weight loss.  He has no neurologic complaints.  He denies any chest pain or shortness of breath.  He denies any nausea, vomiting, constipation, or diarrhea.   He has no melena or hematochezia.  He has no urinary complaints.  Patient offers no specific complaints today.  REVIEW OF SYSTEMS:   Review of Systems  Constitutional: Negative.  Negative for malaise/fatigue.  Respiratory: Negative.   Cardiovascular: Negative.   Gastrointestinal: Negative for blood in stool and melena.  Neurological: Negative for weakness.    As per HPI. Otherwise, a complete review of systems is negatve.  PAST MEDICAL HISTORY: Past Medical History  Diagnosis Date  . CAD (coronary artery disease)   . Atrial fibrillation     on Coumadin  . Hypertension   . Erectile dysfunction   . Hyperlipidemia   . Ischemic cardiomyopathy     s/p AICD/pacer 2009, replaced 2010 [facility]  . Cancer 2001    Prostate, XRT and implant  . Tubular adenoma     colon polyp  . Personal history of prostate cancer 2001    s/p XRT and implant ([facility])  . Sleep apnea, obstructive     uses CPAP nightly  . Anemia   . Thrombocytopenia   . Prostate cancer   . CHF (congestive heart failure)   . MI (myocardial infarction) 1988  . Arrhythmia     a fib  . COPD (chronic obstructive pulmonary disease)   . Vitamin B 12  deficiency   . AICD (automatic cardioverter/defibrillator) present   . GERD (gastroesophageal reflux disease)     PAST SURGICAL HISTORY: Past Surgical History  Procedure Laterality Date  . Vasectomy    . Coronary artery bypass graft  1988  . Implantable cardioverter defibrillator generator change  April 2014    Bi V RRR  . Insert / replace / remove pacemaker  2014  . Cardiac catheterization    . Colonoscopy with propofol [name] 05/30/2015    Procedure: COLONOSCOPY WITH PROPOFOL;  Surgeon: Manya Silvas, MD;  Location: [facility] ENDOSCOPY;  Service: Endoscopy;  Laterality: [name];  . Esophagogastroduodenoscopy [name] 05/30/2015    Procedure: ESOPHAGOGASTRODUODENOSCOPY (EGD);  Surgeon: Manya Silvas, MD;  Location: [facility] ENDOSCOPY;  Service: Endoscopy;  Laterality: [name];    FAMILY HISTORY Family History  Problem Relation Age of Onset  . Hypertension Mother   . Hypertension Father   . Cancer Father     prostate, throat  . Emphysema Father   . Heart disease Maternal Grandmother   . Heart disease Paternal Grandmother        ADVANCED DIRECTIVES:    HEALTH MAINTENANCE: Social History  Substance Use Topics  . Smoking status: Never Smoker   . Smokeless tobacco: Never Used  . Alcohol Use: 1.0 oz/week    2 Standard drinks or equivalent per week     Colonoscopy:  PAP:  Bone density:  Lipid panel:  No  Known Allergies  Current Outpatient Prescriptions  Medication Sig Dispense Refill  . allopurinol (ZYLOPRIM) 100 MG tablet Take 1 tablet (100 mg total) by mouth daily. 90 tablet 3  . Cetirizine HCl ([employer] ALLERGY RELIEF) 10 MG CAPS Take 1 capsule by mouth daily as needed (allergies).    . colchicine 0.6 MG tablet Take 0.6 mg by mouth daily as needed (gout flare).     . cyanocobalamin (,VITAMIN B-12,) 1000 MCG/ML injection Inject 1,000 mcg into the muscle every 30 (thirty) days.    Marland Kitchen doxazosin (CARDURA) 4 MG tablet Take 4 mg by mouth at bedtime.    Marland Kitchen escitalopram (LEXAPRO) 10 MG tablet  TAKE 1 TABLET DAILY 90 tablet 2  . esomeprazole (NEXIUM) 40 MG capsule Take 40 mg by mouth daily at 12 noon.    . furosemide (LASIX) 80 MG tablet TAKE 1 TABLET DAILY 90 tablet 1  . isosorbide mononitrate (IMDUR) 30 MG 24 hr tablet Take 30 mg by mouth daily.  11  . KLOR-CON M20 20 MEQ tablet TAKE 1 TABLET DAILY 90 tablet 1  . lisinopril (PRINIVIL,ZESTRIL) 5 MG tablet Take 2.5 mg by mouth 2 (two) times daily.    . metoprolol succinate (TOPROL-XL) 50 MG 24 hr tablet Take 100 mg by mouth daily. Take with or immediately following a meal.    . spironolactone (ALDACTONE) 25 MG tablet Take 1 tablet (25 mg total) by mouth daily. 90 tablet 2  . TOPROL XL 100 MG 24 hr tablet TAKE 1 TABLET DAILY 90 tablet 0  . ELIQUIS 5 MG TABS tablet TAKE 1 TABLET TWICE A DAY 180 tablet 0  . NEXIUM 40 MG capsule TAKE 1 CAPSULE DAILY BEFORE BREAKFAST 90 capsule 0   No current facility-administered medications for this visit.    OBJECTIVE: Filed Vitals:   09/03/15 1425  BP: 105/64  Pulse: 83  Temp: 96.2 F (35.7 C)  Resp: 16     Body mass index is 32.1 kg/(m^2).    ECOG FS:0 - Asymptomatic  General: Well-developed, well-nourished, no acute distress. Eyes: anicteric sclera. Lungs: Clear to auscultation bilaterally. Heart: Regular rate and rhythm. No rubs, murmurs, or gallops. Abdomen: Soft, nontender, nondistended. No organomegaly noted, normoactive bowel sounds. Musculoskeletal: No edema, cyanosis, or clubbing. Neuro: Alert, answering all questions appropriately. Cranial nerves grossly intact. Skin: No rashes or petechiae noted. Psych: Normal affect.    LAB RESULTS:  Lab Results  Component Value Date   NA 134* 04/24/2015   K 4.6 04/24/2015   CL 102 04/24/2015   CO2 27 04/24/2015   GLUCOSE 76 04/24/2015   BUN 32* 04/24/2015   CREATININE 1.15 04/24/2015   CALCIUM 9.4 04/24/2015   PROT 7.0 03/10/2015   ALBUMIN 4.1 03/10/2015   AST 22 03/10/2015   ALT 12* 03/10/2015   ALKPHOS 54 03/10/2015    BILITOT 0.9 03/10/2015   GFRNONAA >60 03/10/2015   GFRAA >60 03/10/2015    Lab Results  Component Value Date   WBC 6.5 09/03/2015   NEUTROABS 4.3 09/03/2015   HGB 12.4* 09/03/2015   HCT 36.6* 09/03/2015   MCV 95.3 09/03/2015   PLT 113* 09/03/2015     STUDIES: No results found.  ASSESSMENT: Iron deficiency anemia, thrombocytopenia.  PLAN:    1.  Iron deficiency anemia: Patient's hemoglobin and iron stores continue to be within normal limits. He does not require additional IV Feraheme today. Patient last received 510 mg IV Feraheme in July 2015. The remainder of his anemia workup was negative including SIEP  and hemolysis labs. Patient has not required treatment in greater than 1 year, therefore he can be discharged from clinic. Please refer patient back if his hemoglobin and iron stores trend down or he becomes symptomatic.  2.  Thrombocytopenia: Mild. Unchanged for over a year, monitor.  Patient expressed understanding and was in agreement with this plan. He also understands that He can call clinic at any time with any questions, concerns, or complaints.    Lloyd Huger, MD   09/14/2015 11:14 AM

## 2015-10-01 DIAGNOSIS — L821 Other seborrheic keratosis: Secondary | ICD-10-CM | POA: Diagnosis not present

## 2015-10-01 DIAGNOSIS — L820 Inflamed seborrheic keratosis: Secondary | ICD-10-CM | POA: Diagnosis not present

## 2015-10-01 DIAGNOSIS — C4492 Squamous cell carcinoma of skin, unspecified: Secondary | ICD-10-CM | POA: Diagnosis not present

## 2015-10-01 DIAGNOSIS — Q828 Other specified congenital malformations of skin: Secondary | ICD-10-CM | POA: Diagnosis not present

## 2015-10-01 DIAGNOSIS — D485 Neoplasm of uncertain behavior of skin: Secondary | ICD-10-CM | POA: Diagnosis not present

## 2015-10-09 ENCOUNTER — Ambulatory Visit (INDEPENDENT_AMBULATORY_CARE_PROVIDER_SITE_OTHER): Payer: Medicare Other

## 2015-10-09 DIAGNOSIS — E538 Deficiency of other specified B group vitamins: Secondary | ICD-10-CM

## 2015-10-09 MED ORDER — CYANOCOBALAMIN 1000 MCG/ML IJ SOLN
1000.0000 ug | Freq: Once | INTRAMUSCULAR | Status: AC
  Administered 2015-10-09: 1000 ug via INTRAMUSCULAR

## 2015-10-09 NOTE — Progress Notes (Signed)
Patient ID: Rick Gilmore, male   DOB: April 04, 1941, 74 y.o.   MRN: [id number] B12 injection given L deltoid.  Tolerated well. No redness or swelling.  Patient acknowledges he is to make appointment and receive injection every 30 days.

## 2015-10-15 ENCOUNTER — Ambulatory Visit: Payer: Medicare Other | Attending: Family | Admitting: Family

## 2015-10-15 ENCOUNTER — Encounter: Payer: Self-pay | Admitting: Family

## 2015-10-15 VITALS — BP 109/53 | HR 63 | Resp 20 | Ht 75.0 in | Wt 266.0 lb

## 2015-10-15 DIAGNOSIS — I2510 Atherosclerotic heart disease of native coronary artery without angina pectoris: Secondary | ICD-10-CM | POA: Diagnosis not present

## 2015-10-15 DIAGNOSIS — I4891 Unspecified atrial fibrillation: Secondary | ICD-10-CM | POA: Insufficient documentation

## 2015-10-15 DIAGNOSIS — G4733 Obstructive sleep apnea (adult) (pediatric): Secondary | ICD-10-CM | POA: Diagnosis not present

## 2015-10-15 DIAGNOSIS — I252 Old myocardial infarction: Secondary | ICD-10-CM | POA: Diagnosis not present

## 2015-10-15 DIAGNOSIS — Z8546 Personal history of malignant neoplasm of prostate: Secondary | ICD-10-CM | POA: Insufficient documentation

## 2015-10-15 DIAGNOSIS — I255 Ischemic cardiomyopathy: Secondary | ICD-10-CM | POA: Diagnosis not present

## 2015-10-15 DIAGNOSIS — I5023 Acute on chronic systolic (congestive) heart failure: Secondary | ICD-10-CM | POA: Diagnosis not present

## 2015-10-15 DIAGNOSIS — Z95810 Presence of automatic (implantable) cardiac defibrillator: Secondary | ICD-10-CM | POA: Insufficient documentation

## 2015-10-15 DIAGNOSIS — I10 Essential (primary) hypertension: Secondary | ICD-10-CM | POA: Insufficient documentation

## 2015-10-15 DIAGNOSIS — K219 Gastro-esophageal reflux disease without esophagitis: Secondary | ICD-10-CM | POA: Insufficient documentation

## 2015-10-15 DIAGNOSIS — D649 Anemia, unspecified: Secondary | ICD-10-CM | POA: Diagnosis not present

## 2015-10-15 DIAGNOSIS — Z7901 Long term (current) use of anticoagulants: Secondary | ICD-10-CM | POA: Insufficient documentation

## 2015-10-15 DIAGNOSIS — E785 Hyperlipidemia, unspecified: Secondary | ICD-10-CM | POA: Insufficient documentation

## 2015-10-15 DIAGNOSIS — I480 Paroxysmal atrial fibrillation: Secondary | ICD-10-CM

## 2015-10-15 DIAGNOSIS — Z79899 Other long term (current) drug therapy: Secondary | ICD-10-CM | POA: Insufficient documentation

## 2015-10-15 DIAGNOSIS — R252 Cramp and spasm: Secondary | ICD-10-CM

## 2015-10-15 DIAGNOSIS — J449 Chronic obstructive pulmonary disease, unspecified: Secondary | ICD-10-CM | POA: Diagnosis not present

## 2015-10-15 LAB — BASIC METABOLIC PANEL
Anion gap: 6 (ref 5–15)
BUN: 22 mg/dL — ABNORMAL HIGH (ref 6–20)
CO2: 27 mmol/L (ref 22–32)
Calcium: 9 mg/dL (ref 8.9–10.3)
Chloride: 103 mmol/L (ref 101–111)
Creatinine, Ser: 1.02 mg/dL (ref 0.61–1.24)
GFR calc Af Amer: >60 mL/min (ref >60)
GFR calc non Af Amer: >60 mL/min (ref >60)
Glucose, Bld: 104 mg/dL — ABNORMAL HIGH (ref 65–99)
Potassium: 4.1 mmol/L (ref 3.5–5.1)
Sodium: 136 mmol/L (ref 135–145)

## 2015-10-15 MED ORDER — TORSEMIDE 20 MG PO TABS: 80.0000 mg | ORAL_TABLET | Freq: Two times a day (BID) | ORAL | Status: DC

## 2015-10-15 NOTE — Patient Instructions (Addendum)
Continue weighing daily and call for an overnight weight gain of > 2 pounds or a weekly weight gain of >5 pounds.    Stop furosemide.  Replace with torsemide 40mg  twice daily.  Checking lab work today to check kidney function and potassium levels.

## 2015-10-15 NOTE — Progress Notes (Signed)
Subjective:    Patient ID: Rick Gilmore, male    DOB: [birth year], 74 y.o.   MRN: [id number]  Congestive Heart Failure Presents for follow-up visit. The disease course has been fluctuating. Associated symptoms include edema, shortness of breath (in the mornings) and unexpected weight change. Pertinent negatives include no abdominal pain, chest pain, chest pressure, fatigue, orthopnea or palpitations. The symptoms have been stable. Past treatments include ACE inhibitors, beta blockers and salt and fluid restriction. The treatment provided moderate relief. Compliance with prior treatments has been good. His past medical history is significant for anemia, arrhythmia, CAD, chronic lung disease and HTN. Compliance with total regimen is 76-100%.  Other This is a recurrent (edema) problem. The current episode started more than 1 month ago. The problem occurs daily. The problem has been gradually worsening. Pertinent negatives include no abdominal pain, arthralgias, chest pain, congestion, coughing, fatigue, headaches, joint swelling, myalgias, numbness or sore throat. The symptoms are aggravated by walking and standing. He has tried position changes for the symptoms. The treatment provided mild relief.    Past Medical History  Diagnosis Date  . CAD (coronary artery disease)   . Atrial fibrillation ([place])     on Coumadin  . Hypertension   . Erectile dysfunction   . Hyperlipidemia   . Ischemic cardiomyopathy     s/p AICD/pacer 2009, replaced 2010 [facility]  . Cancer [facility]) 2001    Prostate, XRT and implant  . Tubular adenoma     colon polyp  . Personal history of prostate cancer 2001    s/p XRT and implant ([facility])  . Sleep apnea, obstructive     uses CPAP nightly  . Anemia   . Thrombocytopenia ([place])   . Prostate cancer ([place])   . CHF (congestive heart failure) ([place])   . MI (myocardial infarction) ([place]) 1988  . Arrhythmia     a fib  . COPD (chronic obstructive pulmonary disease) ([place])   . Vitamin  B 12 deficiency   . AICD (automatic cardioverter/defibrillator) present   . GERD (gastroesophageal reflux disease)     Past Surgical History  Procedure Laterality Date  . Vasectomy    . Coronary artery bypass graft  1988  . Implantable cardioverter defibrillator generator change  April 2014    Bi V RRR  . Insert / replace / remove pacemaker  2014  . Cardiac catheterization    . Colonoscopy with propofol [name] 05/30/2015    Procedure: COLONOSCOPY WITH PROPOFOL;  Surgeon: Manya Silvas, MD;  Location: [facility] ENDOSCOPY;  Service: Endoscopy;  Laterality: [name];  . Esophagogastroduodenoscopy [name] 05/30/2015    Procedure: ESOPHAGOGASTRODUODENOSCOPY (EGD);  Surgeon: Manya Silvas, MD;  Location: [facility] ENDOSCOPY;  Service: Endoscopy;  Laterality: [name];    Family History  Problem Relation Age of Onset  . Hypertension Mother   . Hypertension Father   . Cancer Father     prostate, throat  . Emphysema Father   . Heart disease Maternal Grandmother   . Heart disease Paternal Grandmother     Social History  Substance Use Topics  . Smoking status: Never Smoker   . Smokeless tobacco: Never Used  . Alcohol Use: 1.0 oz/week    2 Standard drinks or equivalent per week    No Known Allergies  Prior to Admission medications   Medication Sig Start Date End Date Taking? Authorizing Provider  allopurinol (ZYLOPRIM) 100 MG tablet Take 1 tablet (100 mg total) by mouth daily. 07/21/15  Yes Wallene Huh, DPM  Cetirizine HCl ([facility] ALLERGY RELIEF) 10 MG CAPS Take 1 capsule by mouth daily as needed (allergies).   Yes Historical Provider, MD  colchicine 0.6 MG tablet Take 0.6 mg by mouth daily as needed (gout flare).    Yes Historical Provider, MD  cyanocobalamin (,VITAMIN B-12,) 1000 MCG/ML injection Inject 1,000 mcg into the muscle every 30 (thirty) days.   Yes Historical Provider, MD  ELIQUIS 5 MG TABS tablet TAKE 1 TABLET TWICE A DAY 09/08/15  Yes Crecencio Mc, MD  escitalopram (LEXAPRO) 10 MG tablet  TAKE 1 TABLET DAILY 09/02/15  Yes Crecencio Mc, MD  esomeprazole (NEXIUM) 40 MG capsule Take 40 mg by mouth daily at 12 noon.   Yes Historical Provider, MD  isosorbide mononitrate (IMDUR) 30 MG 24 hr tablet Take 30 mg by mouth daily. 10/08/14  Yes Historical Provider, MD  KLOR-CON M20 20 MEQ tablet TAKE 1 TABLET DAILY 08/05/15  Yes Crecencio Mc, MD  lisinopril (PRINIVIL,ZESTRIL) 5 MG tablet Take 2.5 mg by mouth 2 (two) times daily.   Yes Historical Provider, MD  metoprolol succinate (TOPROL-XL) 50 MG 24 hr tablet Take 100 mg by mouth daily. Take with or immediately following a meal.   Yes Historical Provider, MD  NEXIUM 40 MG capsule TAKE 1 CAPSULE DAILY BEFORE BREAKFAST 09/08/15  Yes Crecencio Mc, MD  torsemide (DEMADEX) 20 MG tablet Take 4 tablets (80 mg total) by mouth 2 (two) times daily. 10/15/15   Alisa Graff, FNP       Review of Systems  Constitutional: Positive for unexpected weight change. Negative for appetite change and fatigue.  HENT: Negative for congestion, postnasal drip and sore throat.   Eyes: Negative.   Respiratory: Positive for shortness of breath (in the mornings). Negative for cough and chest tightness.   Cardiovascular: Positive for leg swelling. Negative for chest pain and palpitations.  Gastrointestinal: Negative for abdominal pain and abdominal distention.  Endocrine: Negative.   Genitourinary: Negative.   Musculoskeletal: Negative for myalgias, back pain, joint swelling and arthralgias.       Cramping in both thighs yesterday  Skin: Negative.   Allergic/Immunologic: Negative.   Neurological: Negative for dizziness, light-headedness, numbness and headaches.  Hematological: Negative for adenopathy. Bruises/bleeds easily.  Psychiatric/Behavioral: Negative for sleep disturbance (sleeping on 1 pillow with CPAP nightly) and dysphoric mood. The patient is not nervous/anxious.        Objective:   Physical Exam  Constitutional: He is oriented to person, place,  and time. He appears well-developed and well-nourished.  HENT:  Head: Normocephalic and atraumatic.  Eyes: Conjunctivae are normal. Pupils are equal, round, and reactive to light.  Neck: Normal range of motion. Neck supple.  Cardiovascular: Regular rhythm.  Bradycardia present.   Pulmonary/Chest: Effort normal. He has no wheezes. He has no rales.  Abdominal: Soft. He exhibits no distension. There is no tenderness.  Musculoskeletal: He exhibits edema (1+ pitting in bilateral lower legs). He exhibits no tenderness.  Neurological: He is alert and oriented to person, place, and time.  Skin: Skin is warm and dry.  Psychiatric: He has a normal mood and affect. His behavior is normal. Thought content normal.  Nursing note and vitals reviewed.  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 He's also noticed a little bit more swelling in his lower legs along with an almost 5 pound weight gain in the last week. Home weight chart was reviewed over the last few months and he's had a gradual weight increase but almost 5 pounds in the last week. He has been taking 120mg  furosemide daily since August 2016. By our scale, he's gained 14 pounds since he was last here in May 2016. Does admit to eating more over the last few months but says that he's monitoring his sodium content carefully. Has not been exercising any this month. Will change his diuretic to torsemide to see if he gets a better response from that. He was instructed to stop his furosemide and begin torsemide 40mg  twice daily with the goal being to get him ideally to 40mg  daily. Did receive his flu vaccine already this year. Sees his cardiologist on 11/04/15. 2: Cramps in this thighs- He says that  he had very painful thigh cramps in both of his thighs yesterday and he was quite concerned about that. He has been taking a large amount of furosemide so checked a basic metabolic panel today. Patient informed of results and that the cramps were not due to potassium being low. He is to followup with his PCP should these reoccur. He has an appointment with his PCP on 10/20/15. 3: Atrial fibrillation- Currently rate controlled at this time. Remains on eliquis and metoprolol.   Since he is seeing both his PCP and cardiologist this month and his pulmonologist in December, will have patient return in 3 months. He says that he will call back after he sees his PCP so that he can let us know when his next appointment there will be.

## 2015-10-17 ENCOUNTER — Ambulatory Visit: Payer: Medicare Other | Admitting: Internal Medicine

## 2015-10-20 ENCOUNTER — Encounter: Payer: Self-pay | Admitting: Internal Medicine

## 2015-10-20 ENCOUNTER — Ambulatory Visit (INDEPENDENT_AMBULATORY_CARE_PROVIDER_SITE_OTHER): Payer: Medicare Other | Admitting: Internal Medicine

## 2015-10-20 VITALS — BP 119/72 | HR 84 | Temp 97.1°F | Resp 16 | Wt 263.0 lb

## 2015-10-20 DIAGNOSIS — Z7901 Long term (current) use of anticoagulants: Secondary | ICD-10-CM | POA: Diagnosis not present

## 2015-10-20 DIAGNOSIS — I482 Chronic atrial fibrillation, unspecified: Secondary | ICD-10-CM

## 2015-10-20 DIAGNOSIS — I255 Ischemic cardiomyopathy: Secondary | ICD-10-CM

## 2015-10-20 DIAGNOSIS — Z79899 Other long term (current) drug therapy: Secondary | ICD-10-CM | POA: Diagnosis not present

## 2015-10-20 DIAGNOSIS — D510 Vitamin B12 deficiency anemia due to intrinsic factor deficiency: Secondary | ICD-10-CM

## 2015-10-20 DIAGNOSIS — Z950 Presence of cardiac pacemaker: Secondary | ICD-10-CM

## 2015-10-20 DIAGNOSIS — Z95810 Presence of automatic (implantable) cardiac defibrillator: Secondary | ICD-10-CM

## 2015-10-20 DIAGNOSIS — K648 Other hemorrhoids: Secondary | ICD-10-CM

## 2015-10-20 DIAGNOSIS — E785 Hyperlipidemia, unspecified: Secondary | ICD-10-CM

## 2015-10-20 DIAGNOSIS — D126 Benign neoplasm of colon, unspecified: Secondary | ICD-10-CM

## 2015-10-20 DIAGNOSIS — K5730 Diverticulosis of large intestine without perforation or abscess without bleeding: Secondary | ICD-10-CM

## 2015-10-20 DIAGNOSIS — D509 Iron deficiency anemia, unspecified: Secondary | ICD-10-CM

## 2015-10-20 NOTE — Progress Notes (Signed)
Pre visit review using our clinic review tool, if applicable. No additional management support is needed unless otherwise documented below in the visit note. 

## 2015-10-20 NOTE — Patient Instructions (Signed)
Return next monday for fasting labs   Cardura is the medication we discussed not starting because uit will lower your BP too much

## 2015-10-20 NOTE — Progress Notes (Signed)
Subjective:  Patient ID: Rick Gilmore, male    DOB: 22-Jul-1941  Age: 74 y.o. MRN: [id number]  CC: The primary encounter diagnosis was Hyperlipidemia. Diagnoses of Long-term use of high-risk medication, Ischemic cardiomyopathy, Long term current use of anticoagulant therapy, Pernicious anemia, Hypomagnesemia, Hyperlipidemia LDL goal <70, Iron deficiency anemia, Status post placement of cardiac pacemaker, S/P implantation of automatic cardioverter/defibrillator (AICD), Tubular adenoma of colon, Diverticulosis of colon without hemorrhage, Internal hemorrhoids, and Chronic atrial fibrillation ([place]) were also pertinent to this visit.  HPI Rick Gilmore presents for  Follow up  chronic issues including ischemic cardiomyopathy with systolic dysfunction , chronic atrial fibrillation,  OSA  And IDA. Last seen by me in May, prior to GI evaluation.  He has had progressive weight gain over the last 3 months, some of which was due to lower extremity edema and some due to excess caloric intake.  His diuretic was switched to torsemide40 mg bid  last week by cardiology and he has tracked his daily weights and is down 6 lbs thus far.  His goal weight is  250 lbs at which point we discussed reducing the dose  To 40 mg daily  He feels the best he has felt in a long time and denies chronic pain,  And dyspnea.  Appetite is excellent.   His regular exercise had been suspended due to recent extensive,  but we discussed resuming on a regular basis.   Outpatient Prescriptions Prior to Visit  Medication Sig Dispense Refill  . allopurinol (ZYLOPRIM) 100 MG tablet Take 1 tablet (100 mg total) by mouth daily. 90 tablet 3  . Cetirizine HCl ([employer] ALLERGY RELIEF) 10 MG CAPS Take 1 capsule by mouth daily as needed (allergies).    . colchicine 0.6 MG tablet Take 0.6 mg by mouth daily as needed (gout flare).     . cyanocobalamin (,VITAMIN B-12,) 1000 MCG/ML injection Inject 1,000 mcg into the muscle every 30 (thirty) days.    Marland Kitchen  ELIQUIS 5 MG TABS tablet TAKE 1 TABLET TWICE A DAY 180 tablet 0  . escitalopram (LEXAPRO) 10 MG tablet TAKE 1 TABLET DAILY 90 tablet 2  . isosorbide mononitrate (IMDUR) 30 MG 24 hr tablet Take 30 mg by mouth daily.  11  . KLOR-CON M20 20 MEQ tablet TAKE 1 TABLET DAILY 90 tablet 1  . lisinopril (PRINIVIL,ZESTRIL) 5 MG tablet Take 2.5 mg by mouth 2 (two) times daily.    . metoprolol succinate (TOPROL-XL) 50 MG 24 hr tablet Take 100 mg by mouth daily. Take with or immediately following a meal.    . NEXIUM 40 MG capsule TAKE 1 CAPSULE DAILY BEFORE BREAKFAST 90 capsule 0  . torsemide (DEMADEX) 20 MG tablet Take 4 tablets (80 mg total) by mouth 2 (two) times daily. 360 tablet 3  . esomeprazole (NEXIUM) 40 MG capsule Take 40 mg by mouth daily at 12 noon.     No facility-administered medications prior to visit.    Review of Systems;  Patient denies headache, fevers, malaise, unintentional weight loss, skin rash, eye pain, sinus congestion and sinus pain, sore throat, dysphagia,  hemoptysis , cough, dyspnea, wheezing, chest pain, palpitations, orthopnea, edema, abdominal pain, nausea, melena, diarrhea, constipation, flank pain, dysuria, hematuria, urinary  Frequency, nocturia, numbness, tingling, seizures,  Focal weakness, Loss of consciousness,  Tremor, insomnia, depression, anxiety, and suicidal ideation.      Objective:  BP 119/72 mmHg  Pulse 84  Temp(Src) 97.1 F (36.2 C)  Resp 16  Wt 263 lb (  119.296 kg)  SpO2 96%  BP Readings from Last 3 Encounters:  10/20/15 119/72  10/15/15 109/53  09/03/15 105/64    Wt Readings from Last 3 Encounters:  10/20/15 263 lb (119.296 kg)  10/15/15 266 lb (120.657 kg)  09/03/15 256 lb 13.4 oz (116.501 kg)    General appearance: alert, cooperative and appears stated age Ears: normal TM's and external ear canals both ears Throat: lips, mucosa, and tongue normal; teeth and gums normal Neck: no adenopathy, no carotid bruit, supple, symmetrical, trachea  midline and thyroid not enlarged, symmetric, no tenderness/mass/nodules Back: symmetric, no curvature. ROM normal. No CVA tenderness. Lungs: clear to auscultation bilaterally Heart: regular rate and rhythm, S1, S2 normal, no murmur, click, rub or gallop Abdomen: soft, non-tender; bowel sounds normal; no masses,  no organomegaly Pulses: 2+ and symmetric Skin: Skin color, texture, turgor normal. No rashes or lesions Lymph nodes: Cervical, supraclavicular, and axillary nodes normal.  Lab Results  Component Value Date   HGBA1C 5.9 11/23/2014   HGBA1C 5.9 11/21/2014   HGBA1C 5.6 06/24/2014    Lab Results  Component Value Date   CREATININE 1.02 10/15/2015   CREATININE 1.15 04/24/2015   CREATININE 1.29 04/21/2015    Lab Results  Component Value Date   WBC 6.5 09/03/2015   HGB 12.4* 09/03/2015   HCT 36.6* 09/03/2015   PLT 113* 09/03/2015   GLUCOSE 104* 10/15/2015   CHOL 123 01/22/2015   TRIG 53.0 01/22/2015   HDL 40.80 01/22/2015   LDLDIRECT 59.9 11/05/2011   LDLCALC 72 01/22/2015   ALT 12* 03/10/2015   AST 22 03/10/2015   NA 136 10/15/2015   K 4.1 10/15/2015   CL 103 10/15/2015   CREATININE 1.02 10/15/2015   BUN 22* 10/15/2015   CO2 27 10/15/2015   TSH 1.82 01/22/2015   INR 2.3 06/17/2014   HGBA1C 5.9 11/23/2014    No results found.  Assessment & Plan:   Problem List Items Addressed This Visit    Ischemic cardiomyopathy    Last admission was Dec 2015,  EF 30 to 35% .  Asymptomatic currently.  BP improved so can take entire lisinopril 5 mg dose once daily.  Edema is improving with use of torsemide 40 mg bid.  Lungs are clear today.  Continue bid dosing until home weight is 250 lbs.       Long term current use of anticoagulant therapy    His hgb and platelets have remained stable on Eliquis over the last several months.   Lab Results  Component Value Date   WBC 6.5 09/03/2015   HGB 12.4* 09/03/2015   HCT 36.6* 09/03/2015   MCV 95.3 09/03/2015   PLT 113*  09/03/2015         Pernicious anemia    Lab Results  Component Value Date   [id number] 02/22/2013   By prior testing,  Continue B12 supplementation       RESOLVED: Hypomagnesemia    Advised to continue magnesium until his level is verified.          Hyperlipidemia LDL goal <70    Fasting lipids have not been rechecked Since suspension of simvastatin for l, low platelets .  Repeat testing next week advised   Lab Results  Component Value Date   CHOL 123 01/22/2015   HDL 40.80 01/22/2015   LDLCALC 72 01/22/2015   LDLDIRECT 59.9 11/05/2011   TRIG 53.0 01/22/2015   CHOLHDL 3 01/22/2015          Iron  deficiency anemia    GI evaluation done in June with EGD and colonoscopy..  Portal hypertensive gastropathy noted,  Internal hemorrhoids,  diverticulosis and adenomatous polyps found.  Lab Results  Component Value Date   WBC 6.5 09/03/2015   HGB 12.4* 09/03/2015   HCT 36.6* 09/03/2015   MCV 95.3 09/03/2015   PLT 113* 09/03/2015    Lab Results  Component Value Date   IRON 76 09/03/2015   TIBC 354 09/03/2015   FERRITIN 211 09/03/2015          Relevant Orders   CBC with Differential/Platelet   Tubular adenoma of colon    Multiple, by colonoscopy June 2016 for IDA and heme positive stools.  3 yr follow up presumed       Atrial fibrillation [facility])    S/p pacer,  Anticoagulated with Eliquis      Status post placement of cardiac pacemaker   S/P implantation of automatic cardioverter/defibrillator (AICD)   Diverticulosis of colon without hemorrhage    Found on diagnostic colonoscopy June 2016,  [place]      Internal hemorrhoids    Other Visit Diagnoses    Hyperlipidemia    -  Primary    Relevant Orders    Lipid panel    Long-term use of high-risk medication        Relevant Orders    Comprehensive metabolic panel       I have discontinued Rick Gilmore's esomeprazole. I am also having him maintain his cyanocobalamin, isosorbide mononitrate, lisinopril,  colchicine, metoprolol succinate, Cetirizine HCl, allopurinol, KLOR-CON M20, escitalopram, ELIQUIS, NEXIUM, and torsemide.  No orders of the defined types were placed in this encounter.    Medications Discontinued During This Encounter  Medication Reason  . esomeprazole (NEXIUM) 40 MG capsule    A total of 40 minutes was spent with patient more than half of which was spent in counseling patient on the above mentioned issues , reviewing and explaining recent labs and diagnostic studies done, and coordination of care. Follow-up: No Follow-up on file.   Crecencio Mc, MD

## 2015-10-21 ENCOUNTER — Encounter: Payer: Self-pay | Admitting: Internal Medicine

## 2015-10-21 DIAGNOSIS — K648 Other hemorrhoids: Secondary | ICD-10-CM | POA: Insufficient documentation

## 2015-10-21 DIAGNOSIS — Z950 Presence of cardiac pacemaker: Secondary | ICD-10-CM | POA: Insufficient documentation

## 2015-10-21 DIAGNOSIS — Z95810 Presence of automatic (implantable) cardiac defibrillator: Secondary | ICD-10-CM | POA: Insufficient documentation

## 2015-10-21 DIAGNOSIS — K5730 Diverticulosis of large intestine without perforation or abscess without bleeding: Secondary | ICD-10-CM | POA: Insufficient documentation

## 2015-10-21 NOTE — Assessment & Plan Note (Signed)
Fasting lipids have not been rechecked Since suspension of simvastatin for l, low platelets .  Repeat testing next week advised   Lab Results  Component Value Date   CHOL 123 01/22/2015   HDL 40.80 01/22/2015   LDLCALC 72 01/22/2015   LDLDIRECT 59.9 11/05/2011   TRIG 53.0 01/22/2015   CHOLHDL 3 01/22/2015

## 2015-10-21 NOTE — Assessment & Plan Note (Signed)
S/p pacer,  Anticoagulated with Eliquis 

## 2015-10-21 NOTE — Assessment & Plan Note (Addendum)
GI evaluation done in June with EGD and colonoscopy..  Portal hypertensive gastropathy noted,  Internal hemorrhoids,  diverticulosis and adenomatous polyps found.  Lab Results  Component Value Date   WBC 6.5 09/03/2015   HGB 12.4* 09/03/2015   HCT 36.6* 09/03/2015   MCV 95.3 09/03/2015   PLT 113* 09/03/2015    Lab Results  Component Value Date   IRON 76 09/03/2015   TIBC 354 09/03/2015   FERRITIN 211 09/03/2015

## 2015-10-21 NOTE — Assessment & Plan Note (Addendum)
Found on diagnostic colonoscopy June 2016,  Rick Gilmore

## 2015-10-21 NOTE — Assessment & Plan Note (Signed)
Last admission was Dec 2015,  EF 30 to 35% .  Asymptomatic currently.  BP improved so can take entire lisinopril 5 mg dose once daily.  Edema is improving with use of torsemide 40 mg bid.  Lungs are clear today.  Continue bid dosing until home weight is 250 lbs.

## 2015-10-21 NOTE — Assessment & Plan Note (Signed)
Multiple, by colonoscopy June 2016 for IDA and heme positive stools.  3 yr follow up presumed

## 2015-10-21 NOTE — Assessment & Plan Note (Signed)
His hgb and platelets have remained stable on Eliquis over the last several months.   Lab Results  Component Value Date   WBC 6.5 09/03/2015   HGB 12.4* 09/03/2015   HCT 36.6* 09/03/2015   MCV 95.3 09/03/2015   PLT 113* 09/03/2015

## 2015-10-21 NOTE — Assessment & Plan Note (Signed)
Lab Results  Component Value Date   [id number] 02/22/2013   By prior testing,  Continue B12 supplementation

## 2015-10-21 NOTE — Assessment & Plan Note (Signed)
Advised to continue magnesium until his level is verified.

## 2015-10-23 ENCOUNTER — Other Ambulatory Visit: Payer: Self-pay | Admitting: Internal Medicine

## 2015-10-23 DIAGNOSIS — C44629 Squamous cell carcinoma of skin of left upper limb, including shoulder: Secondary | ICD-10-CM | POA: Diagnosis not present

## 2015-10-28 ENCOUNTER — Telehealth: Payer: Self-pay | Admitting: *Deleted

## 2015-10-28 ENCOUNTER — Other Ambulatory Visit (INDEPENDENT_AMBULATORY_CARE_PROVIDER_SITE_OTHER): Payer: Medicare Other

## 2015-10-28 DIAGNOSIS — Z79899 Other long term (current) drug therapy: Secondary | ICD-10-CM

## 2015-10-28 DIAGNOSIS — D509 Iron deficiency anemia, unspecified: Secondary | ICD-10-CM | POA: Diagnosis not present

## 2015-10-28 DIAGNOSIS — E785 Hyperlipidemia, unspecified: Secondary | ICD-10-CM

## 2015-10-28 LAB — CBC WITH DIFFERENTIAL/PLATELET
Basophils Absolute: 0 10*3/uL (ref 0.0–0.1)
Basophils Relative: 0.4 % (ref 0.0–3.0)
Eosinophils Absolute: 0.1 10*3/uL (ref 0.0–0.7)
Eosinophils Relative: 1.3 % (ref 0.0–5.0)
HCT: 34.2 % — ABNORMAL LOW (ref 39.0–52.0)
Hemoglobin: 11.3 g/dL — ABNORMAL LOW (ref 13.0–17.0)
Lymphocytes Relative: 16.7 % (ref 12.0–46.0)
Lymphs Abs: 1 10*3/uL (ref 0.7–4.0)
MCHC: 33.2 g/dL (ref 30.0–36.0)
MCV: 96.3 fl (ref 78.0–100.0)
Monocytes Absolute: 0.6 10*3/uL (ref 0.1–1.0)
Monocytes Relative: 10.9 % (ref 3.0–12.0)
Neutro Abs: 4.1 10*3/uL (ref 1.4–7.7)
Neutrophils Relative %: 70.7 % (ref 43.0–77.0)
Platelets: 128 10*3/uL — ABNORMAL LOW (ref 150.0–400.0)
RBC: 3.55 Mil/uL — ABNORMAL LOW (ref 4.22–5.81)
RDW: 15.6 % — ABNORMAL HIGH (ref 11.5–15.5)
WBC: 5.9 10*3/uL (ref 4.0–10.5)

## 2015-10-28 LAB — COMPREHENSIVE METABOLIC PANEL
ALT: 12 U/L (ref 0–53)
AST: 17 U/L (ref 0–37)
Albumin: 4.2 g/dL (ref 3.5–5.2)
Alkaline Phosphatase: 50 U/L (ref 39–117)
BUN: 30 mg/dL — ABNORMAL HIGH (ref 6–23)
CO2: 28 mEq/L (ref 19–32)
Calcium: 9.2 mg/dL (ref 8.4–10.5)
Chloride: 103 mEq/L (ref 96–112)
Creatinine, Ser: 1.2 mg/dL (ref 0.40–1.50)
GFR: 62.88 mL/min (ref >60.00)
Glucose, Bld: 97 mg/dL (ref 70–99)
Potassium: 4.2 mEq/L (ref 3.5–5.1)
Sodium: 139 mEq/L (ref 135–145)
Total Bilirubin: 0.7 mg/dL (ref 0.2–1.2)
Total Protein: 6.6 g/dL (ref 6.0–8.3)

## 2015-10-28 LAB — LIPID PANEL
Cholesterol: 135 mg/dL (ref 0–200)
HDL: 35.2 mg/dL — ABNORMAL LOW (ref >39.00)
LDL Cholesterol: 85 mg/dL (ref 0–99)
NonHDL: 100.11
Total CHOL/HDL Ratio: 4
Triglycerides: 74 mg/dL (ref 0.0–149.0)
VLDL: 14.8 mg/dL (ref 0.0–40.0)

## 2015-10-28 NOTE — Telephone Encounter (Signed)
Thank you for thinking ahead .  But it is no longer needed , he is no longer on coumadin ,

## 2015-10-28 NOTE — Telephone Encounter (Signed)
Ok thanks 

## 2015-10-28 NOTE — Telephone Encounter (Signed)
i collected a blue top (for pt.inr) i didn't see you order it, do you want me to throw it away

## 2015-10-29 ENCOUNTER — Telehealth: Payer: Self-pay | Admitting: Internal Medicine

## 2015-10-29 ENCOUNTER — Ambulatory Visit (INDEPENDENT_AMBULATORY_CARE_PROVIDER_SITE_OTHER): Payer: Medicare Other | Admitting: Family Medicine

## 2015-10-29 ENCOUNTER — Encounter: Payer: Self-pay | Admitting: Family Medicine

## 2015-10-29 VITALS — BP 116/64 | HR 86 | Temp 97.6°F | Ht 75.0 in | Wt 266.4 lb

## 2015-10-29 DIAGNOSIS — I255 Ischemic cardiomyopathy: Secondary | ICD-10-CM | POA: Diagnosis not present

## 2015-10-29 DIAGNOSIS — T148XXA Other injury of unspecified body region, initial encounter: Secondary | ICD-10-CM

## 2015-10-29 DIAGNOSIS — S5012XA Contusion of left forearm, initial encounter: Secondary | ICD-10-CM

## 2015-10-29 NOTE — Patient Instructions (Signed)
Nice to meet you. It appears that the area of concern is bruising. Does not appear that you have a cellulitis. Please monitor this area. If he develops spreading redness, warmth to the area, firmness to the area, tenderness, or fevers or chills or any new or change in symptoms please seek medical attention.

## 2015-10-29 NOTE — Telephone Encounter (Signed)
Patient has redened area to inner forearm from a tattoo.Needs Appointment. [place] today.

## 2015-10-29 NOTE — Progress Notes (Signed)
Pre visit review using our clinic review tool, if applicable. No additional management support is needed unless otherwise documented below in the visit note. 

## 2015-10-30 DIAGNOSIS — T148XXA Other injury of unspecified body region, initial encounter: Secondary | ICD-10-CM | POA: Insufficient documentation

## 2015-10-30 NOTE — Progress Notes (Signed)
Patient ID: Rick Gilmore, male   DOB: 01-Jan-1941, 74 y.o.   MRN: [id number]  Tommi Rumps, MD Phone: [phone]  Rick Gilmore is a 74 y.o. male who presents today for same-day visit.  Patient notes several days ago he had a tattoo of his grandchildren's first initials placed on his left forearm. He was seen yesterday for lab work and was noted to have some redness around the tattoo. There was a line drawn around the redness yesterday and patient was advised to let us know if he developed any spreading redness. He notes that the area has become less red and now has more of a purplish bluish hue to it, though this hue has spread outside of the line drawn yesterday. The area does not hurt him. He has no warmth to it. He has no fevers or chills. He feels well. He feels this is most likely a bruise. He is on call [facility].  PMH: nonsmoker.   ROS see history of present illness  Objective  Physical Exam Filed Vitals:   10/29/15 1558  BP: 116/64  Pulse: 86  Temp: 97.6 F (36.4 C)    Physical Exam  Constitutional: He is well-developed, well-nourished, and in no distress.  HENT:  Head: Normocephalic and atraumatic.  Cardiovascular: Normal rate, regular rhythm and normal heart sounds.  Exam reveals no gallop and no friction rub.   No murmur heard. Pulmonary/Chest: Effort normal and breath sounds normal. No respiratory distress. He has no wheezes. He has no rales.  Skin: He is not diaphoretic.        Assessment/Plan: Please see individual problem list.  Bruise Area on his left forearm surrounding his tattoo is consistent with bruising. This is to be expected given the patient is on Eliquis. Patient does not have fever. He feels well. His vitals are stable. There is no warmth, erythema, tenderness, induration, or fluctuance to indicate infection. He will continue to monitor this. If he develops erythema, warmth, tenderness, firmness, or fever or chills he'll seek medical attention. He  is given return precautions.    Tommi Rumps

## 2015-10-30 NOTE — Assessment & Plan Note (Signed)
Area on his left forearm surrounding his tattoo is consistent with bruising. This is to be expected given the patient is on Eliquis. Patient does not have fever. He feels well. His vitals are stable. There is no warmth, erythema, tenderness, induration, or fluctuance to indicate infection. He will continue to monitor this. If he develops erythema, warmth, tenderness, firmness, or fever or chills he'll seek medical attention. He is given return precautions.

## 2015-10-31 ENCOUNTER — Encounter: Payer: Self-pay | Admitting: Internal Medicine

## 2015-11-04 DIAGNOSIS — Z951 Presence of aortocoronary bypass graft: Secondary | ICD-10-CM | POA: Diagnosis not present

## 2015-11-04 DIAGNOSIS — I255 Ischemic cardiomyopathy: Secondary | ICD-10-CM | POA: Diagnosis not present

## 2015-11-04 DIAGNOSIS — I10 Essential (primary) hypertension: Secondary | ICD-10-CM | POA: Diagnosis not present

## 2015-11-04 DIAGNOSIS — R600 Localized edema: Secondary | ICD-10-CM | POA: Diagnosis not present

## 2015-11-04 DIAGNOSIS — I480 Paroxysmal atrial fibrillation: Secondary | ICD-10-CM | POA: Diagnosis not present

## 2015-11-04 DIAGNOSIS — E782 Mixed hyperlipidemia: Secondary | ICD-10-CM | POA: Diagnosis not present

## 2015-11-05 ENCOUNTER — Ambulatory Visit: Payer: Medicare Other | Admitting: Family

## 2015-11-06 ENCOUNTER — Encounter: Payer: Self-pay | Admitting: Internal Medicine

## 2015-11-06 ENCOUNTER — Ambulatory Visit (INDEPENDENT_AMBULATORY_CARE_PROVIDER_SITE_OTHER): Payer: Medicare Other | Admitting: Internal Medicine

## 2015-11-06 VITALS — BP 128/68 | HR 71 | Ht 75.0 in | Wt 255.0 lb

## 2015-11-06 DIAGNOSIS — J90 Pleural effusion, not elsewhere classified: Secondary | ICD-10-CM | POA: Diagnosis not present

## 2015-11-06 DIAGNOSIS — I255 Ischemic cardiomyopathy: Secondary | ICD-10-CM | POA: Diagnosis not present

## 2015-11-06 DIAGNOSIS — G4733 Obstructive sleep apnea (adult) (pediatric): Secondary | ICD-10-CM | POA: Diagnosis not present

## 2015-11-06 NOTE — Progress Notes (Signed)
MRN# [id number] Rick Gilmore October 14, 1941   CC: Chief Complaint  Patient presents with  . Follow-up    pt. states he wears CPAP 8-9 hrs everynight. pressure is good. no supplies needed at this time.  no new complaints      Brief History: 10/29/14 Patient is a pleasant 74 year old male referred by Dr. Derrel Nip for second opinion about bilateral pleural effusion and shortness of breath. Brief history - history of ischemic cardiomyopathy, CHF NYFC III, OSA, BiV ICD, atrial fibrillation, Iron Def Anemia, Mild thrombocytopenia, prostate cancer (s\p seed implants and XRT) and obesity. He was referred by his cardiologist to Dr. Raul Del [facility]) for evaluation of dyspnea. Bilateral pleural effusions were found on chestCT, moderate sized, R > L, with R lung atelectasis. No thoracentesis was ordered or planned by Dr. Raul Del. He is now wearing supplemental oxygen at night (2L). The initiation of Imdur at night has made a considerable improvement in his dyspnea,EF is still 35% by repeat ECHO,taking Eliquis for mitigation of CVA risk. Patient has no history of chest pain, pleurisy, calf pain or cough, no hemoptysis, no syncope, he doesn't a history of initial fibrillation with a decreased ejection fraction on anticoagulation. He also has a history of ulcerative sleep apnea, on CPAP, and is compliant with his CPAP machine. Review of records showed he saw Dr. Raul Del on 09/24/2014 for shortness of breath, Dr. Raul Del was working him up for possible pulmonary hypertension due to severely elevated RVSP (70.2) on an echo in July 2015, workup would include weight loss, continue CPAP, overnight oximetry, trial of anoro ellipta/albuterol, pulmonary rehabilitation, which strict salt and fluids, possible evaluation for right heart cardiac catheterization and follow-up in February 2016.  Patient was not satisfied with the above plan outlined by Dr. Raul Del, according to patient it was not clearly stated,  and even though his shortness of breath was moderately improving, he did not understand why he was having bilateral pleural effusions. Currently he can walk about 30 yards, still has 1-2+ pitting edema, but wears compression stockings. Patient also follows with cardiology closely, [facility] (Dr. Nehemiah Massed). He denies any fevers, chills, night sweats, rapid weight loss. Plan - poor cardiac function, no thoracentesis, continue follow up with Dr. Raul Del.    Events since last clinic visit: Patient here for follow up visit for chronic bilateral pleural effusions. Since his last visit he states that he has been doing well. No worsening DOE, sob at rest, chest tightness. No significant leg edema. Early this month he did have increase in his dry weight and was noted to be a little more short of breath, today he is back to baseline weight, about 256lbs. Currently using CPAP nightly.    Medication:   Current Outpatient Rx  Name  Route  Sig  Dispense  Refill  . allopurinol (ZYLOPRIM) 100 MG tablet   Oral   Take 1 tablet (100 mg total) by mouth daily.   90 tablet   3   . Cetirizine HCl ([employer] ALLERGY RELIEF) 10 MG CAPS   Oral   Take 1 capsule by mouth daily as needed (allergies).         . colchicine 0.6 MG tablet   Oral   Take 0.6 mg by mouth daily as needed (gout flare).          . cyanocobalamin (,VITAMIN B-12,) 1000 MCG/ML injection   Intramuscular   Inject 1,000 mcg into the muscle every 30 (thirty) days.         Marland Kitchen  ELIQUIS 5 MG TABS tablet      TAKE 1 TABLET TWICE A DAY   180 tablet   0   . escitalopram (LEXAPRO) 10 MG tablet      TAKE 1 TABLET DAILY   90 tablet   2   . isosorbide mononitrate (IMDUR) 30 MG 24 hr tablet   Oral   Take 30 mg by mouth daily.      11   . KLOR-CON M20 20 MEQ tablet      TAKE 1 TABLET DAILY   90 tablet   1   . lisinopril (PRINIVIL,ZESTRIL) 5 MG tablet   Oral   Take 2.5 mg by mouth 2 (two) times daily.         . metoprolol  succinate (TOPROL-XL) 50 MG 24 hr tablet   Oral   Take 100 mg by mouth daily. Take with or immediately following a meal.         . NEXIUM 40 MG capsule      TAKE 1 CAPSULE DAILY BEFORE BREAKFAST   90 capsule   0   . torsemide (DEMADEX) 20 MG tablet   Oral   Take by mouth.            Review of Systems: Gen:  Denies  fever, sweats, chills HEENT: Denies blurred vision, double vision, ear pain, eye pain, hearing loss, nose bleeds, sore throat Cvc:  No dizziness, chest pain or heaviness Resp:   No cough,no worsening doe Gi: Denies swallowing difficulty, stomach pain, nausea or vomiting, diarrhea, constipation, bowel incontinence Gu:  Denies bladder incontinence, burning urine Ext:   No Joint pain, stiffness or swelling Skin: No skin rash, easy bruising or bleeding or hives Endoc:  No polyuria, polydipsia , polyphagia or weight change Other:  All other systems negative  Allergies:  Review of patient's allergies indicates no known allergies.  Physical Examination:  VS: BP 128/68 mmHg  Pulse 71  Ht 6\' 3"  (1.905 m)  Wt 255 lb (115.667 kg)  BMI 31.87 kg/m2  SpO2 97%  General Appearance: No distress  HEENT: PERRLA, no ptosis, no other lesions noticed Pulmonary:normal breath sounds., diaphragmatic excursion normal.No wheezing, No rales   Cardiovascular:  Normal S1,S2.  No m/r/g.     Abdomen:Exam: Benign, Soft, non-tender, No masses  Skin:   warm, no rashes, no ecchymosis  Extremities: normal, no cyanosis, clubbing, warm with normal capillary refill.  Trace pedal edema      Assessment and Plan:74 yo Male with PMHx of dCHF, seen in follow up today for bilateral pleural effusion and transition of care.  Pleural effusion, bilateral Multifactorial - CHF, OSA, Cardiomyopathy, poor EF, Afib, elevated RSVP, possible PAH or pHTN  Discussed in detail pathophysiology of these effusions and the multifactorial etiologies associated with them.  Explained to patient in great length and  detail that these effusions are most likely related to his severe heart disease, poor ejection fraction, and possible pulmonary hypertension. Pulmonary hypertension and its subclasses were also discussed with the patient including pulmonary arterial hypertension. Explain reasons for not performing a thoracentesis; given his poor cardiac function, and this effusion most is likely transudative related to CHF; draining the effusion will create negative pressure and reoccurrence of the effusion. While a thoracocentesis may provide temporary relief, it is not curative, and likelihood of effusion recurring and becoming more massive is increased with thoracentesis.  At this time patient is in agreement with no further workup for effusions unless he becomes moderately or severely  symptomatic.  Since his last visit he has had moderate improvement in his dyspnea, he does not feel the need to purse a RCA, which I am in agreement with today, based on his clinical status.  Plan - supportive care - cont with CHF regiment which is diuretics and BP management (see med list).  - diet, exercise     Sleep apnea, obstructive  OSA   Encouraged proper weight management.  Excessive weight may contribute to snoring.  Monitor sedative use.  Discussed driving precautions and its relationship with hypersomnolence.  Discussed operating dangerous equipment and its relationship with hypersomnolence.  Discussed sleep hygiene, and benefits of a fixed sleep waked time.  The importance of getting eight or more hours of sleep discussed with patient.  Discussed limiting the use of the computer and television before bedtime.  Decrease naps during the day, so night time sleep will become enhanced.  Limit caffeine, and sleep deprivation.  HTN, stroke, and heart failure are potential risk factors.      Plan: Continue current prescription for CPAP, 12 cm of H2O        Updated Medication List Outpatient Encounter  Prescriptions as of 11/06/2015  Medication Sig  . allopurinol (ZYLOPRIM) 100 MG tablet Take 1 tablet (100 mg total) by mouth daily.  . Cetirizine HCl ([employer] ALLERGY RELIEF) 10 MG CAPS Take 1 capsule by mouth daily as needed (allergies).  . colchicine 0.6 MG tablet Take 0.6 mg by mouth daily as needed (gout flare).   . cyanocobalamin (,VITAMIN B-12,) 1000 MCG/ML injection Inject 1,000 mcg into the muscle every 30 (thirty) days.  Marland Kitchen ELIQUIS 5 MG TABS tablet TAKE 1 TABLET TWICE A DAY  . escitalopram (LEXAPRO) 10 MG tablet TAKE 1 TABLET DAILY  . isosorbide mononitrate (IMDUR) 30 MG 24 hr tablet Take 30 mg by mouth daily.  Marland Kitchen KLOR-CON M20 20 MEQ tablet TAKE 1 TABLET DAILY  . lisinopril (PRINIVIL,ZESTRIL) 5 MG tablet Take 2.5 mg by mouth 2 (two) times daily.  . metoprolol succinate (TOPROL-XL) 50 MG 24 hr tablet Take 100 mg by mouth daily. Take with or immediately following a meal.  . NEXIUM 40 MG capsule TAKE 1 CAPSULE DAILY BEFORE BREAKFAST  . torsemide (DEMADEX) 20 MG tablet Take by mouth.  . [DISCONTINUED] esomeprazole (NEXIUM) 40 MG capsule Take 40 mg by mouth daily at 12 noon.  . [DISCONTINUED] furosemide (LASIX) 80 MG tablet TAKE 1 TABLET DAILY (Patient not taking: Reported on 11/06/2015)  . [DISCONTINUED] torsemide (DEMADEX) 20 MG tablet Take 4 tablets (80 mg total) by mouth 2 (two) times daily. (Patient not taking: Reported on 11/06/2015)   No facility-administered encounter medications on file as of 11/06/2015.    Orders for this visit: Orders Placed This Encounter  Procedures  . DG Chest 2 View    Standing Status: Future     Number of Occurrences:      Standing Expiration Date: 01/05/2017    Order Specific Question:  Reason for Exam (SYMPTOM  OR DIAGNOSIS REQUIRED)    Answer:  pleural effusion    Order Specific Question:  Preferred imaging location?    Answer:  [facility]  . Pulmonary function test    Standing Status: Future     Number of Occurrences:      Standing Expiration  Date: 11/05/2016    Order Specific Question:  Where should this test be performed?    Answer:  [facility]    Order Specific Question:  Full PFT: includes the following:  basic spirometry, spirometry pre & post bronchodilator, diffusion capacity (DLCO), lung volumes    Answer:  Full PFT    Order Specific Question:  MIP/MEP    Answer:  No    Order Specific Question:  6 minute walk    Answer:  Yes    Order Specific Question:  ABG    Answer:  No    Order Specific Question:  Diffusion capacity (DLCO)    Answer:  Yes    Order Specific Question:  Lung volumes    Answer:  Yes    Order Specific Question:  Methacholine challenge    Answer:  No    Thank  you for the visitation and for allowing  [facility] to assist in the care of your patient. Our recommendations are noted above.  Please contact us if we can be of further service.  Vilinda Boehringer, MD [facility] Office Number: [phone]

## 2015-11-06 NOTE — Assessment & Plan Note (Signed)
Multifactorial - CHF, OSA, Cardiomyopathy, poor EF, Afib, elevated RSVP, possible PAH or pHTN  Discussed in detail pathophysiology of these effusions and the multifactorial etiologies associated with them.  Explained to patient in great length and detail that these effusions are most likely related to his severe heart disease, poor ejection fraction, and possible pulmonary hypertension. Pulmonary hypertension and its subclasses were also discussed with the patient including pulmonary arterial hypertension. Explain reasons for not performing a thoracentesis; given his poor cardiac function, and this effusion most is likely transudative related to CHF; draining the effusion will create negative pressure and reoccurrence of the effusion. While a thoracocentesis may provide temporary relief, it is not curative, and likelihood of effusion recurring and becoming more massive is increased with thoracentesis.  At this time patient is in agreement with no further workup for effusions unless he becomes moderately or severely symptomatic.  Since his last visit he has had moderate improvement in his dyspnea, he does not feel the need to purse a RCA, which I am in agreement with today, based on his clinical status.  Plan - supportive care - cont with CHF regiment which is diuretics and BP management (see med list).  - diet, exercise

## 2015-11-06 NOTE — Patient Instructions (Signed)
Follow up with Dr. Stevenson Clinch in 3 months - pulmonary function test and 6 minute walk test prior to follow up visit.  - cont with low salt diet - cont with with exercise and wt. Loss regiment.  - 2 view CXR for chronic pleural effusion prior to follow up visit.

## 2015-11-06 NOTE — Assessment & Plan Note (Signed)
  OSA   Encouraged proper weight management.  Excessive weight may contribute to snoring.  Monitor sedative use.  Discussed driving precautions and its relationship with hypersomnolence.  Discussed operating dangerous equipment and its relationship with hypersomnolence.  Discussed sleep hygiene, and benefits of a fixed sleep waked time.  The importance of getting eight or more hours of sleep discussed with patient.  Discussed limiting the use of the computer and television before bedtime.  Decrease naps during the day, so night time sleep will become enhanced.  Limit caffeine, and sleep deprivation.  HTN, stroke, and heart failure are potential risk factors.      Plan: Continue current prescription for CPAP, 12 cm of H2O

## 2015-11-11 ENCOUNTER — Ambulatory Visit (INDEPENDENT_AMBULATORY_CARE_PROVIDER_SITE_OTHER): Payer: Medicare Other

## 2015-11-11 DIAGNOSIS — E538 Deficiency of other specified B group vitamins: Secondary | ICD-10-CM

## 2015-11-11 MED ORDER — CYANOCOBALAMIN 1000 MCG/ML IJ SOLN
1000.0000 ug | Freq: Once | INTRAMUSCULAR | Status: AC
  Administered 2015-11-11: 1000 ug via INTRAMUSCULAR

## 2015-11-11 NOTE — Progress Notes (Signed)
Patient came in for B12 injection.  Received in Right deltoid.  Patient tolerated well.  

## 2015-11-12 ENCOUNTER — Encounter: Payer: Self-pay | Admitting: Internal Medicine

## 2015-11-20 ENCOUNTER — Other Ambulatory Visit: Payer: Self-pay | Admitting: Internal Medicine

## 2015-11-20 NOTE — Telephone Encounter (Signed)
Please advise on refill of Toprol. Historical provider

## 2015-11-20 NOTE — Telephone Encounter (Signed)
PLEASE CALL PATIENT AND CONFIRM THAT HE IS TAKING IT SINCE IT HAS NOT BEEN FILLED HERE

## 2015-12-05 ENCOUNTER — Other Ambulatory Visit: Payer: Self-pay | Admitting: Internal Medicine

## 2015-12-22 ENCOUNTER — Encounter: Payer: Self-pay | Admitting: Internal Medicine

## 2015-12-22 NOTE — Progress Notes (Signed)
Faxed order to sleep med with OV notes for CPAP.

## 2016-01-06 ENCOUNTER — Other Ambulatory Visit: Payer: Self-pay | Admitting: Internal Medicine

## 2016-01-07 ENCOUNTER — Other Ambulatory Visit: Payer: Self-pay | Admitting: Family Medicine

## 2016-01-07 MED ORDER — TORSEMIDE 20 MG PO TABS: 20.0000 mg | ORAL_TABLET | Freq: Every day | ORAL | Status: DC

## 2016-01-07 NOTE — Telephone Encounter (Signed)
90 day supply authorized and sent to [facility]

## 2016-01-07 NOTE — Telephone Encounter (Signed)
Patient last seen 10/20/15 this medication was filled by [facility]. I have no record you filled this RX.

## 2016-01-08 ENCOUNTER — Ambulatory Visit: Payer: Medicare Other | Attending: Family | Admitting: Family

## 2016-01-08 ENCOUNTER — Encounter: Payer: Self-pay | Admitting: Family

## 2016-01-08 VITALS — BP 106/47 | HR 63 | Resp 20 | Ht 75.0 in | Wt 266.0 lb

## 2016-01-08 DIAGNOSIS — Z95810 Presence of automatic (implantable) cardiac defibrillator: Secondary | ICD-10-CM | POA: Diagnosis not present

## 2016-01-08 DIAGNOSIS — Z8546 Personal history of malignant neoplasm of prostate: Secondary | ICD-10-CM | POA: Insufficient documentation

## 2016-01-08 DIAGNOSIS — Z9889 Other specified postprocedural states: Secondary | ICD-10-CM | POA: Insufficient documentation

## 2016-01-08 DIAGNOSIS — J449 Chronic obstructive pulmonary disease, unspecified: Secondary | ICD-10-CM | POA: Diagnosis not present

## 2016-01-08 DIAGNOSIS — I4891 Unspecified atrial fibrillation: Secondary | ICD-10-CM | POA: Diagnosis not present

## 2016-01-08 DIAGNOSIS — E785 Hyperlipidemia, unspecified: Secondary | ICD-10-CM | POA: Insufficient documentation

## 2016-01-08 DIAGNOSIS — I255 Ischemic cardiomyopathy: Secondary | ICD-10-CM | POA: Diagnosis not present

## 2016-01-08 DIAGNOSIS — Z79899 Other long term (current) drug therapy: Secondary | ICD-10-CM | POA: Insufficient documentation

## 2016-01-08 DIAGNOSIS — G4733 Obstructive sleep apnea (adult) (pediatric): Secondary | ICD-10-CM | POA: Diagnosis not present

## 2016-01-08 DIAGNOSIS — I110 Hypertensive heart disease with heart failure: Secondary | ICD-10-CM | POA: Diagnosis not present

## 2016-01-08 DIAGNOSIS — I482 Chronic atrial fibrillation, unspecified: Secondary | ICD-10-CM

## 2016-01-08 DIAGNOSIS — I252 Old myocardial infarction: Secondary | ICD-10-CM | POA: Diagnosis not present

## 2016-01-08 DIAGNOSIS — I5022 Chronic systolic (congestive) heart failure: Secondary | ICD-10-CM | POA: Diagnosis not present

## 2016-01-08 DIAGNOSIS — I2510 Atherosclerotic heart disease of native coronary artery without angina pectoris: Secondary | ICD-10-CM | POA: Insufficient documentation

## 2016-01-08 DIAGNOSIS — Z7901 Long term (current) use of anticoagulants: Secondary | ICD-10-CM | POA: Insufficient documentation

## 2016-01-08 DIAGNOSIS — K219 Gastro-esophageal reflux disease without esophagitis: Secondary | ICD-10-CM | POA: Insufficient documentation

## 2016-01-08 NOTE — Progress Notes (Signed)
Subjective:    Patient ID: Rick Gilmore, male    DOB: 02/11/1941, 75 y.o.   MRN: [id number]  Congestive Heart Failure Presents for follow-up visit. The disease course has been fluctuating. Associated symptoms include edema, fatigue, shortness of breath and unexpected weight change. Pertinent negatives include no abdominal pain, chest pain, orthopnea or palpitations. Past treatments include ACE inhibitors, beta blockers and salt and fluid restriction. The treatment provided moderate relief. Compliance with prior treatments has been good. His past medical history is significant for arrhythmia, CAD, chronic lung disease and HTN. Compliance with total regimen is 76-100%.  Other This is a recurrent (weight gain) problem. The current episode started more than 1 month ago. The problem occurs every several days. The problem has been waxing and waning. Associated symptoms include fatigue. Pertinent negatives include no abdominal pain, chest pain, congestion, coughing, headaches, nausea, neck pain, sore throat or weakness. The symptoms are aggravated by eating. He has tried nothing for the symptoms.    Past Medical History  Diagnosis Date  . CAD (coronary artery disease)   . Atrial fibrillation ([place])     on Coumadin  . Hypertension   . Erectile dysfunction   . Hyperlipidemia   . Ischemic cardiomyopathy     s/p AICD/pacer 2009, replaced 2010 [facility]  . Cancer [facility]) 2001    Prostate, XRT and implant  . Tubular adenoma     colon polyp  . Personal history of prostate cancer 2001    s/p XRT and implant ([place])  . Sleep apnea, obstructive     uses CPAP nightly  . Anemia   . Thrombocytopenia ([place])   . Prostate cancer ([place])   . CHF (congestive heart failure) ([place])   . MI (myocardial infarction) ([place]) 1988  . Arrhythmia     a fib  . COPD (chronic obstructive pulmonary disease) ([place])   . Vitamin B 12 deficiency   . AICD (automatic cardioverter/defibrillator) present   . GERD (gastroesophageal reflux  disease)     Past Surgical History  Procedure Laterality Date  . Vasectomy    . Coronary artery bypass graft  1988  . Implantable cardioverter defibrillator generator change  April 2014    Bi V RRR  . Insert / replace / remove pacemaker  2014  . Cardiac catheterization    . Colonoscopy with propofol [name] 05/30/2015    Procedure: COLONOSCOPY WITH PROPOFOL;  Surgeon: Manya Silvas, MD;  Location: [facility] ENDOSCOPY;  Service: Endoscopy;  Laterality: [name];  . Esophagogastroduodenoscopy [name] 05/30/2015    Procedure: ESOPHAGOGASTRODUODENOSCOPY (EGD);  Surgeon: Manya Silvas, MD;  Location: [facility] ENDOSCOPY;  Service: Endoscopy;  Laterality: [name];    Family History  Problem Relation Age of Onset  . Hypertension Mother   . Hypertension Father   . Cancer Father     prostate, throat  . Emphysema Father   . Heart disease Maternal Grandmother   . Heart disease Paternal Grandmother     Social History  Substance Use Topics  . Smoking status: Never Smoker   . Smokeless tobacco: Never Used  . Alcohol Use: 1.0 oz/week    2 Standard drinks or equivalent per week    No Known Allergies  Prior to Admission medications   Medication Sig Start Date End Date Taking? Authorizing Provider  allopurinol (ZYLOPRIM) 100 MG tablet Take 1 tablet (100 mg total) by mouth daily. 07/21/15  Yes Wallene Huh, DPM  Cetirizine HCl ([employer] ALLERGY RELIEF) 10 MG CAPS Take 1 capsule by mouth  daily as needed (allergies).   Yes Historical Provider, MD  colchicine 0.6 MG tablet Take 0.6 mg by mouth daily as needed (gout flare).    Yes Historical Provider, MD  cyanocobalamin (,VITAMIN B-12,) 1000 MCG/ML injection Inject 1,000 mcg into the muscle every 30 (thirty) days.   Yes Historical Provider, MD  doxazosin (CARDURA) 4 MG tablet TAKE 1 TABLET AT BEDTIME 01/06/16  Yes Crecencio Mc, MD  ELIQUIS 5 MG TABS tablet TAKE 1 TABLET TWICE A DAY 12/05/15  Yes Crecencio Mc, MD  escitalopram (LEXAPRO) 10 MG tablet TAKE 1 TABLET  DAILY 09/02/15  Yes Crecencio Mc, MD  isosorbide mononitrate (IMDUR) 30 MG 24 hr tablet Take 30 mg by mouth daily. 10/08/14  Yes Historical Provider, MD  KLOR-CON M20 20 MEQ tablet TAKE 1 TABLET DAILY 08/05/15  Yes Crecencio Mc, MD  lisinopril (PRINIVIL,ZESTRIL) 5 MG tablet Take 2.5 mg by mouth 2 (two) times daily.   Yes Historical Provider, MD  metoprolol succinate (TOPROL-XL) 50 MG 24 hr tablet Take 100 mg by mouth daily. Take with or immediately following a meal.   Yes Historical Provider, MD  NEXIUM 40 MG capsule TAKE 1 CAPSULE DAILY BEFORE BREAKFAST 12/05/15  Yes Crecencio Mc, MD  torsemide (DEMADEX) 20 MG tablet Take 1 tablet (20 mg total) by mouth daily. Patient taking differently: Take 40 mg by mouth 2 (two) times daily.  01/07/16  Yes Crecencio Mc, MD     Review of Systems  Constitutional: Positive for appetite change (eating more/increased apppetite), fatigue and unexpected weight change.  HENT: Negative for congestion, rhinorrhea and sore throat.   Eyes: Negative.   Respiratory: Positive for shortness of breath. Negative for cough and chest tightness.   Cardiovascular: Positive for leg swelling (mild amount in both lower legs). Negative for chest pain and palpitations.  Gastrointestinal: Negative for nausea, abdominal pain and abdominal distention.  Endocrine: Negative.   Genitourinary: Negative.   Musculoskeletal: Negative for back pain and neck pain.  Skin: Negative.   Allergic/Immunologic: Negative.   Neurological: Positive for light-headedness (infrequent when standing quickly). Negative for dizziness, weakness and headaches.  Hematological: Negative for adenopathy. Bruises/bleeds easily.  Psychiatric/Behavioral: Negative for sleep disturbance (sleeping on 1 pillow; wearing CPAP nightly.) and dysphoric mood. The patient is not nervous/anxious.        Objective:   Physical Exam  Constitutional: He is oriented to person, place, and time. He appears well-developed and  well-nourished.  HENT:  Head: Normocephalic and atraumatic.  Eyes: Conjunctivae are normal. Pupils are equal, round, and reactive to light.  Neck: Normal range of motion. Neck supple.  Cardiovascular: An irregularly irregular rhythm present. Bradycardia present.   Pulmonary/Chest: Effort normal. He has no wheezes. He has no rales.  Abdominal: Soft. He exhibits no distension. There is no tenderness.  Musculoskeletal: He exhibits edema (trace amount of pitting edema in bilateral lower legs). He exhibits no tenderness.  Neurological: He is alert and oriented to person, place, and time.  Skin: Skin is warm and dry.  Psychiatric: He has a normal mood and affect. His behavior is normal. Thought content normal.  Nursing note and vitals reviewed.   BP 106/47 mmHg  Pulse 63  Resp 20  Ht 6\' 3"  (1.905 m)  Wt 266 lb (120.657 kg)  BMI 33.25 kg/m2  SpO2 97%       Assessment & Plan:  1: Chronic heart failure with reduced ejection fraction- Patient presents with some shortness of breath upon exertion  and a mild amount of swelling in both lower legs. He's most concerned regarding his sporadic weight gain. He continues to weigh himself daily and upon reviewing his weight charts, he'll have an overnight weight gain of >3 pounds every 1-2 weeks. Over the last few months, he's gained a gradual amount of weight although by our scale, his weight is unchanged. He does admit to eating way more than he used to and he's trying to decrease his calories. Instructed him that should he have an overnight weight gain of >3 pounds, he can take an additional 20mg  torsemide in the morning which would then be 60mg  AM and 40mg  PM. When weight goes back down, decrease dosage back to 40mg  twice daily. Discussed possibly using metolazone as well but will try this first. Patient is not adding salt to his food and tries to pick low sodium options but does eat out often. Has seen his cardiologist in the last few months. 2: Atrial  fibrillation- Currently rate controlled at this time. Continues to take metoprolol and eliquis. 3: Obstructive sleep apnea- Continues to wear his CPAP on a nightly basis and feels like he is sleeping well. Sees his pulmonologist in a couple of weeks.   Return here in 2 months or sooner for any questions/problems before then.

## 2016-01-08 NOTE — Patient Instructions (Addendum)
Continue weighing daily and call for an overnight weight gain of > 2 pounds or a weekly weight gain of >5 pounds.  If weight goes up 3 pounds or more overnight, take 3 tablets of torsemide in the morning (total 60mg ) and the normal 2 tablets (total 40mg ) in the evening. When the weight drops back down, reduce the torsemide to 2 tablets (40mg ) twice daily.

## 2016-01-16 ENCOUNTER — Emergency Department: Payer: Medicare Other

## 2016-01-16 ENCOUNTER — Inpatient Hospital Stay
Admit: 2016-01-16 | Discharge: 2016-01-16 | Disposition: A | Discharge status: Home / Self Care | Payer: Medicare Other | Attending: Internal Medicine | Admitting: Internal Medicine

## 2016-01-16 ENCOUNTER — Encounter: Payer: Self-pay | Admitting: Emergency Medicine

## 2016-01-16 ENCOUNTER — Telehealth: Payer: Self-pay

## 2016-01-16 ENCOUNTER — Encounter: Payer: Self-pay | Admitting: Internal Medicine

## 2016-01-16 ENCOUNTER — Ambulatory Visit (INDEPENDENT_AMBULATORY_CARE_PROVIDER_SITE_OTHER): Payer: Medicare Other | Admitting: Internal Medicine

## 2016-01-16 ENCOUNTER — Inpatient Hospital Stay
Admission: EM | Admit: 2016-01-16 | Discharge: 2016-01-19 | DRG: 291 | Disposition: A | Discharge status: Home / Self Care | Payer: Medicare Other | Attending: Internal Medicine | Admitting: Internal Medicine

## 2016-01-16 VITALS — BP 128/68 | HR 69 | Temp 98.2°F | Resp 30 | Ht 75.0 in | Wt 270.5 lb

## 2016-01-16 DIAGNOSIS — I252 Old myocardial infarction: Secondary | ICD-10-CM

## 2016-01-16 DIAGNOSIS — Z951 Presence of aortocoronary bypass graft: Secondary | ICD-10-CM

## 2016-01-16 DIAGNOSIS — Z79899 Other long term (current) drug therapy: Secondary | ICD-10-CM | POA: Diagnosis not present

## 2016-01-16 DIAGNOSIS — Z95810 Presence of automatic (implantable) cardiac defibrillator: Secondary | ICD-10-CM | POA: Diagnosis not present

## 2016-01-16 DIAGNOSIS — I248 Other forms of acute ischemic heart disease: Secondary | ICD-10-CM | POA: Diagnosis present

## 2016-01-16 DIAGNOSIS — I110 Hypertensive heart disease with heart failure: Secondary | ICD-10-CM | POA: Diagnosis not present

## 2016-01-16 DIAGNOSIS — I130 Hypertensive heart and chronic kidney disease with heart failure and stage 1 through stage 4 chronic kidney disease, or unspecified chronic kidney disease: Principal | ICD-10-CM | POA: Diagnosis present

## 2016-01-16 DIAGNOSIS — R0600 Dyspnea, unspecified: Secondary | ICD-10-CM | POA: Diagnosis not present

## 2016-01-16 DIAGNOSIS — I255 Ischemic cardiomyopathy: Secondary | ICD-10-CM | POA: Diagnosis present

## 2016-01-16 DIAGNOSIS — Z9111 Patient's noncompliance with dietary regimen: Secondary | ICD-10-CM | POA: Diagnosis not present

## 2016-01-16 DIAGNOSIS — G4733 Obstructive sleep apnea (adult) (pediatric): Secondary | ICD-10-CM | POA: Diagnosis present

## 2016-01-16 DIAGNOSIS — D696 Thrombocytopenia, unspecified: Secondary | ICD-10-CM | POA: Diagnosis present

## 2016-01-16 DIAGNOSIS — I2510 Atherosclerotic heart disease of native coronary artery without angina pectoris: Secondary | ICD-10-CM | POA: Diagnosis present

## 2016-01-16 DIAGNOSIS — Z923 Personal history of irradiation: Secondary | ICD-10-CM

## 2016-01-16 DIAGNOSIS — R05 Cough: Secondary | ICD-10-CM | POA: Diagnosis not present

## 2016-01-16 DIAGNOSIS — R739 Hyperglycemia, unspecified: Secondary | ICD-10-CM | POA: Diagnosis present

## 2016-01-16 DIAGNOSIS — N189 Chronic kidney disease, unspecified: Secondary | ICD-10-CM | POA: Diagnosis present

## 2016-01-16 DIAGNOSIS — J9691 Respiratory failure, unspecified with hypoxia: Secondary | ICD-10-CM | POA: Diagnosis present

## 2016-01-16 DIAGNOSIS — R0602 Shortness of breath: Secondary | ICD-10-CM | POA: Diagnosis not present

## 2016-01-16 DIAGNOSIS — J9600 Acute respiratory failure, unspecified whether with hypoxia or hypercapnia: Secondary | ICD-10-CM | POA: Diagnosis not present

## 2016-01-16 DIAGNOSIS — Z8042 Family history of malignant neoplasm of prostate: Secondary | ICD-10-CM | POA: Diagnosis not present

## 2016-01-16 DIAGNOSIS — Z9889 Other specified postprocedural states: Secondary | ICD-10-CM

## 2016-01-16 DIAGNOSIS — E785 Hyperlipidemia, unspecified: Secondary | ICD-10-CM | POA: Diagnosis present

## 2016-01-16 DIAGNOSIS — R748 Abnormal levels of other serum enzymes: Secondary | ICD-10-CM | POA: Diagnosis not present

## 2016-01-16 DIAGNOSIS — Z825 Family history of asthma and other chronic lower respiratory diseases: Secondary | ICD-10-CM

## 2016-01-16 DIAGNOSIS — Z8546 Personal history of malignant neoplasm of prostate: Secondary | ICD-10-CM

## 2016-01-16 DIAGNOSIS — I509 Heart failure, unspecified: Secondary | ICD-10-CM | POA: Insufficient documentation

## 2016-01-16 DIAGNOSIS — Z808 Family history of malignant neoplasm of other organs or systems: Secondary | ICD-10-CM

## 2016-01-16 DIAGNOSIS — J441 Chronic obstructive pulmonary disease with (acute) exacerbation: Secondary | ICD-10-CM | POA: Diagnosis present

## 2016-01-16 DIAGNOSIS — K219 Gastro-esophageal reflux disease without esophagitis: Secondary | ICD-10-CM | POA: Diagnosis present

## 2016-01-16 DIAGNOSIS — R7989 Other specified abnormal findings of blood chemistry: Secondary | ICD-10-CM | POA: Diagnosis not present

## 2016-01-16 DIAGNOSIS — Z8249 Family history of ischemic heart disease and other diseases of the circulatory system: Secondary | ICD-10-CM

## 2016-01-16 DIAGNOSIS — I5023 Acute on chronic systolic (congestive) heart failure: Secondary | ICD-10-CM

## 2016-01-16 DIAGNOSIS — Z86010 Personal history of colonic polyps: Secondary | ICD-10-CM | POA: Diagnosis not present

## 2016-01-16 DIAGNOSIS — I959 Hypotension, unspecified: Secondary | ICD-10-CM | POA: Diagnosis present

## 2016-01-16 DIAGNOSIS — I482 Chronic atrial fibrillation: Secondary | ICD-10-CM | POA: Diagnosis present

## 2016-01-16 DIAGNOSIS — R0902 Hypoxemia: Secondary | ICD-10-CM

## 2016-01-16 DIAGNOSIS — R778 Other specified abnormalities of plasma proteins: Secondary | ICD-10-CM

## 2016-01-16 DIAGNOSIS — R0603 Acute respiratory distress: Secondary | ICD-10-CM

## 2016-01-16 DIAGNOSIS — R7303 Prediabetes: Secondary | ICD-10-CM

## 2016-01-16 LAB — CBC
HCT: 35 % — ABNORMAL LOW (ref 40.0–52.0)
Hemoglobin: 11.6 g/dL — ABNORMAL LOW (ref 13.0–18.0)
MCH: 31.3 pg (ref 26.0–34.0)
MCHC: 33.1 g/dL (ref 32.0–36.0)
MCV: 94.7 fL (ref 80.0–100.0)
Platelets: 81 10*3/uL — ABNORMAL LOW (ref 150–440)
RBC: 3.7 MIL/uL — ABNORMAL LOW (ref 4.40–5.90)
RDW: 15.4 % — ABNORMAL HIGH (ref 11.5–14.5)
WBC: 8.1 10*3/uL (ref 3.8–10.6)

## 2016-01-16 LAB — BASIC METABOLIC PANEL
Anion gap: 4 — ABNORMAL LOW (ref 5–15)
BUN: 26 mg/dL — ABNORMAL HIGH (ref 6–20)
CO2: 28 mmol/L (ref 22–32)
Calcium: 9.2 mg/dL (ref 8.9–10.3)
Chloride: 103 mmol/L (ref 101–111)
Creatinine, Ser: 1.29 mg/dL — ABNORMAL HIGH (ref 0.61–1.24)
GFR calc Af Amer: >60 mL/min (ref >60)
GFR calc non Af Amer: 53 mL/min — ABNORMAL LOW (ref >60)
Glucose, Bld: 139 mg/dL — ABNORMAL HIGH (ref 65–99)
Potassium: 4.8 mmol/L (ref 3.5–5.1)
Sodium: 135 mmol/L (ref 135–145)

## 2016-01-16 LAB — TROPONIN I
Troponin I: 0.04 ng/mL — ABNORMAL HIGH (ref <0.031)
Troponin I: 0.06 ng/mL — ABNORMAL HIGH (ref <0.031)
Troponin I: 0.09 ng/mL — ABNORMAL HIGH (ref <0.031)

## 2016-01-16 LAB — BRAIN NATRIURETIC PEPTIDE: B Natriuretic Peptide: 700 pg/mL — ABNORMAL HIGH (ref 0.0–100.0)

## 2016-01-16 MED ORDER — ESCITALOPRAM OXALATE 10 MG PO TABS
10.0000 mg | ORAL_TABLET | Freq: Every day | ORAL | Status: DC
  Administered 2016-01-16 – 2016-01-19 (×4): 10 mg via ORAL
  Filled 2016-01-16 (×5): qty 1

## 2016-01-16 MED ORDER — SODIUM CHLORIDE 0.9% FLUSH
3.0000 mL | Freq: Two times a day (BID) | INTRAVENOUS | Status: DC
  Administered 2016-01-16 – 2016-01-19 (×7): 3 mL via INTRAVENOUS

## 2016-01-16 MED ORDER — POTASSIUM CHLORIDE CRYS ER 20 MEQ PO TBCR
20.0000 meq | EXTENDED_RELEASE_TABLET | Freq: Every day | ORAL | Status: DC
Start: 2016-01-16 — End: 2016-01-19
  Administered 2016-01-16 – 2016-01-18 (×3): 20 meq via ORAL
  Filled 2016-01-16 (×3): qty 1

## 2016-01-16 MED ORDER — CYANOCOBALAMIN 1000 MCG/ML IJ SOLN
1000.0000 ug | INTRAMUSCULAR | Status: DC
  Administered 2016-01-16: 1000 ug via INTRAMUSCULAR
  Filled 2016-01-16: qty 1

## 2016-01-16 MED ORDER — DOXAZOSIN MESYLATE 4 MG PO TABS
4.0000 mg | ORAL_TABLET | Freq: Every day | ORAL | Status: DC
  Filled 2016-01-16: qty 1

## 2016-01-16 MED ORDER — METOPROLOL SUCCINATE ER 100 MG PO TB24
100.0000 mg | ORAL_TABLET | Freq: Every day | ORAL | Status: DC
  Administered 2016-01-16: 100 mg via ORAL
  Filled 2016-01-16: qty 1

## 2016-01-16 MED ORDER — ONDANSETRON HCL 4 MG PO TABS: 4.0000 mg | ORAL_TABLET | Freq: Four times a day (QID) | ORAL | Status: DC | PRN

## 2016-01-16 MED ORDER — NITROGLYCERIN 2 % TD OINT
0.5000 [in_us] | TOPICAL_OINTMENT | Freq: Four times a day (QID) | TRANSDERMAL | Status: DC
  Administered 2016-01-17 – 2016-01-19 (×9): 0.5 [in_us] via TOPICAL
  Filled 2016-01-16 (×9): qty 1

## 2016-01-16 MED ORDER — FUROSEMIDE 10 MG/ML IJ SOLN
40.0000 mg | Freq: Two times a day (BID) | INTRAMUSCULAR | Status: DC
  Filled 2016-01-16: qty 4

## 2016-01-16 MED ORDER — ISOSORBIDE MONONITRATE ER 30 MG PO TB24
30.0000 mg | ORAL_TABLET | Freq: Every day | ORAL | Status: DC
  Administered 2016-01-16: 30 mg via ORAL
  Filled 2016-01-16: qty 1

## 2016-01-16 MED ORDER — LISINOPRIL 5 MG PO TABS
5.0000 mg | ORAL_TABLET | Freq: Every day | ORAL | Status: DC
  Administered 2016-01-16: 5 mg via ORAL
  Filled 2016-01-16: qty 1

## 2016-01-16 MED ORDER — ALLOPURINOL 100 MG PO TABS
100.0000 mg | ORAL_TABLET | Freq: Every day | ORAL | Status: DC
  Administered 2016-01-17 – 2016-01-19 (×3): 100 mg via ORAL
  Filled 2016-01-16 (×4): qty 1

## 2016-01-16 MED ORDER — FUROSEMIDE 10 MG/ML IJ SOLN
40.0000 mg | Freq: Once | INTRAMUSCULAR | Status: AC
  Administered 2016-01-16: 40 mg via INTRAVENOUS
  Filled 2016-01-16: qty 4

## 2016-01-16 MED ORDER — ACETAMINOPHEN 325 MG PO TABS: 650.0000 mg | ORAL_TABLET | Freq: Four times a day (QID) | ORAL | Status: DC | PRN

## 2016-01-16 MED ORDER — PANTOPRAZOLE SODIUM 40 MG PO TBEC
40.0000 mg | DELAYED_RELEASE_TABLET | Freq: Every day | ORAL | Status: DC
  Administered 2016-01-16 – 2016-01-19 (×4): 40 mg via ORAL
  Filled 2016-01-16 (×4): qty 1

## 2016-01-16 MED ORDER — APIXABAN 5 MG PO TABS
5.0000 mg | ORAL_TABLET | Freq: Two times a day (BID) | ORAL | Status: DC
  Administered 2016-01-16 – 2016-01-19 (×7): 5 mg via ORAL
  Filled 2016-01-16 (×7): qty 1

## 2016-01-16 MED ORDER — ACETAMINOPHEN 650 MG RE SUPP: 650.0000 mg | Freq: Four times a day (QID) | RECTAL | Status: DC | PRN

## 2016-01-16 MED ORDER — ONDANSETRON HCL 4 MG/2ML IJ SOLN
4.0000 mg | Freq: Four times a day (QID) | INTRAMUSCULAR | Status: DC | PRN
Start: 2016-01-16 — End: 2016-01-19

## 2016-01-16 NOTE — Progress Notes (Signed)
2 L of oxgyen. A fib. Takes meds ok. Pt ambulated to the room and tolerated it. Pt has no further concerns at this time.

## 2016-01-16 NOTE — Progress Notes (Signed)
Pre-visit discussion using our clinic review tool. No additional management support is needed unless otherwise documented below in the visit note.  

## 2016-01-16 NOTE — Telephone Encounter (Signed)
Reason for call: Pt c/o cold Symptoms: coughing - non productive, chest tightness, SOB, and nasal congestion Duration: since Monday Medications: none Last seen for this problem: new problem Seen by: Dr. Derrel Nip

## 2016-01-16 NOTE — Patient Instructions (Addendum)
Please go to the ER and take my note with you. You are having decompensated heart failure

## 2016-01-16 NOTE — Assessment & Plan Note (Signed)
PATIENT NEEDS IV LASIX THERAPY , ISCHEMIA RULE OUT AND RESP SUPPORT. SENDING TO ER.  CARDIOLOGY OFFICE CONTACTED,  DR Erin Fulling IS OUT OF THE OFFICE

## 2016-01-16 NOTE — Progress Notes (Signed)
Did not give 2200 meds due to low BP MD notified acknowledged

## 2016-01-16 NOTE — ED Provider Notes (Signed)
[facility] Emergency Department Provider Note  ____________________________________________  Time seen: Approximately 1:08 PM  I have reviewed the triage vital signs and the nursing notes.   HISTORY  Chief Complaint Shortness of Breath    HPI Candace Deiters is a 75 y.o. male history of coronary disease, atrial fibrillation on eliquis, cardiomyopathy with reduced EF, as well as COPD.  Patient reports that he was doing well until yesterday. Started developing shortness of breath, worsened with walking and exertion. Denies any chest pain. Has had a dry nonproductive cough. No fevers chills runny nose. Patient reports he went up to the [employer] this weekend and felt fine. Size primary care doctor today was concerned about his respirations, and felt that he needed a dose of IV Lasix was referred to the emergency room.  Slight chronic swelling in his legs.  Past Medical History  Diagnosis Date  . CAD (coronary artery disease)   . Atrial fibrillation ([place])     on Coumadin  . Hypertension   . Erectile dysfunction   . Hyperlipidemia   . Ischemic cardiomyopathy     s/p AICD/pacer 2009, replaced 2010 [facility]  . Cancer [facility]) 2001    Prostate, XRT and implant  . Tubular adenoma     colon polyp  . Personal history of prostate cancer 2001    s/p XRT and implant ([place])  . Sleep apnea, obstructive     uses CPAP nightly  . Anemia   . Thrombocytopenia ([place])   . Prostate cancer ([place])   . CHF (congestive heart failure) ([place])   . MI (myocardial infarction) ([place]) 1988  . Arrhythmia     a fib  . COPD (chronic obstructive pulmonary disease) ([place])   . Vitamin B 12 deficiency   . AICD (automatic cardioverter/defibrillator) present   . GERD (gastroesophageal reflux disease)     Patient Active Problem List   Diagnosis Date Noted  . Respiratory failure, acute ([place]) 01/16/2016  . Acute decompensated heart failure ([place]) 01/16/2016  . Bruise 10/30/2015  . Status post  placement of cardiac pacemaker 10/21/2015  . S/P implantation of automatic cardioverter/defibrillator (AICD) 10/21/2015  . Diverticulosis of colon without hemorrhage 10/21/2015  . Internal hemorrhoids 10/21/2015  . Atrial fibrillation ([place]) 10/15/2015  . Tubular adenoma of colon 06/04/2015  . COPD (chronic obstructive pulmonary disease) ([place]) 04/20/2015  . Iron deficiency anemia 04/13/2015  . Thrombocytopenia ([place]) 12/01/2014  . Hyperlipidemia LDL goal <70 12/01/2014  . Pleural effusion, bilateral 10/17/2014  . Chronic systolic heart failure ([place]) 10/08/2014  . Long term current use of anticoagulant therapy 11/02/2011  . Pernicious anemia 11/02/2011  . Ischemic cardiomyopathy   . Sleep apnea, obstructive     Past Surgical History  Procedure Laterality Date  . Vasectomy    . Coronary artery bypass graft  1988  . Implantable cardioverter defibrillator generator change  April 2014    Bi V RRR  . Insert / replace / remove pacemaker  2014  . Cardiac catheterization    . Colonoscopy with propofol [name] 05/30/2015    Procedure: COLONOSCOPY WITH PROPOFOL;  Surgeon: Manya Silvas, MD;  Location: [facility] ENDOSCOPY;  Service: Endoscopy;  Laterality: [name];  . Esophagogastroduodenoscopy [name] 05/30/2015    Procedure: ESOPHAGOGASTRODUODENOSCOPY (EGD);  Surgeon: Manya Silvas, MD;  Location: [facility] ENDOSCOPY;  Service: Endoscopy;  Laterality: [name];    Current Outpatient Rx  Name  Route  Sig  Dispense  Refill  . allopurinol (ZYLOPRIM) 100 MG tablet   Oral  Take 1 tablet (100 mg total) by mouth daily.   90 tablet   3   . apixaban (ELIQUIS) 5 MG TABS tablet   Oral   Take 5 mg by mouth 2 (two) times daily.         . colchicine 0.6 MG tablet   Oral   Take 0.6 mg by mouth daily as needed (for gout flares).          . cyanocobalamin (,VITAMIN B-12,) 1000 MCG/ML injection   Intramuscular   Inject 1,000 mcg into the muscle every 30 (thirty) days.         Marland Kitchen doxazosin (CARDURA) 4 MG  tablet   Oral   Take 4 mg by mouth at bedtime.         Marland Kitchen escitalopram (LEXAPRO) 10 MG tablet   Oral   Take 10 mg by mouth daily.         Marland Kitchen esomeprazole (NEXIUM) 40 MG capsule   Oral   Take 40 mg by mouth daily before breakfast.         . isosorbide mononitrate (IMDUR) 30 MG 24 hr tablet   Oral   Take 30 mg by mouth daily.      11   . lisinopril (PRINIVIL,ZESTRIL) 5 MG tablet   Oral   Take 5 mg by mouth daily.          . potassium chloride SA (K-DUR,KLOR-CON) 20 MEQ tablet   Oral   Take 20 mEq by mouth at bedtime.         . torsemide (DEMADEX) 20 MG tablet   Oral   Take 40 mg by mouth 2 (two) times daily.         . metoprolol succinate (TOPROL-XL) 50 MG 24 hr tablet   Oral   Take 100 mg by mouth daily. Take with or immediately following a meal.           Allergies Review of patient's allergies indicates no known allergies.  Family History  Problem Relation Age of Onset  . Hypertension Mother   . Hypertension Father   . Cancer Father     prostate, throat  . Emphysema Father   . Heart disease Maternal Grandmother   . Heart disease Paternal Grandmother     Social History Social History  Substance Use Topics  . Smoking status: Never Smoker   . Smokeless tobacco: Never Used  . Alcohol Use: 1.0 oz/week    2 Standard drinks or equivalent per week    Review of Systems Constitutional: No fever/chills Eyes: No visual changes. ENT: No sore throat. Cardiovascular: Denies chest pain. Respiratory: See history of present illness Gastrointestinal: No abdominal pain.  No nausea, no vomiting.  No diarrhea.  No constipation. Genitourinary: Negative for dysuria. Musculoskeletal: Negative for back pain. Skin: Negative for rash. Neurological: Negative for headaches, focal weakness or numbness.  10-point ROS otherwise negative.  ____________________________________________   PHYSICAL EXAM:  VITAL SIGNS: ED Triage Vitals  Enc Vitals Group     BP  01/16/16 1054 122/48 mmHg     Pulse Rate 01/16/16 1054 66     Resp 01/16/16 1054 24     Temp 01/16/16 1054 98.6 F (37 C)     Temp Source 01/16/16 1054 Oral     SpO2 01/16/16 1050 96 %     Weight 01/16/16 1054 265 lb (120.203 kg)     Height 01/16/16 1054 6\' 3"  (1.905 m)     Head Cir --  Peak Flow --      Pain Score --      Pain Loc --      Pain Edu? --      Excl. in [place]? --    Constitutional: Alert and oriented. Well appearing and in no acute distress. Eyes: Conjunctivae are normal. PERRL. EOMI. Head: Atraumatic. Nose: No congestion/rhinnorhea. Mouth/Throat: Mucous membranes are moist.  Oropharynx non-erythematous. Neck: No stridor.   Cardiovascular: Normal rate, irregular rhythm. Grossly normal heart sounds.  Good peripheral circulation. Respiratory: Normal respiratory effort.  No retractions. Lungs CTAB except for mild crackles in bases bilateral. Gastrointestinal: Soft and nontender. No distention.  Musculoskeletal: No lower extremity tenderness is 1+ bilateral edema.  No joint effusions. Neurologic:  Normal speech and language. No gross focal neurologic deficits are appreciated. Skin:  Skin is warm, dry and intact. No rash noted. Psychiatric: Mood and affect are normal. Speech and behavior are normal.  ____________________________________________   LABS (all labs ordered are listed, but only abnormal results are displayed)  Labs Reviewed  BRAIN NATRIURETIC PEPTIDE - Abnormal; Notable for the following:    B Natriuretic Peptide 700.0 (*)    All other components within normal limits  CBC - Abnormal; Notable for the following:    RBC 3.70 (*)    Hemoglobin 11.6 (*)    HCT 35.0 (*)    RDW 15.4 (*)    Platelets 81 (*)    All other components within normal limits  TROPONIN I - Abnormal; Notable for the following:    Troponin I 0.04 (*)    All other components within normal limits  BASIC METABOLIC PANEL - Abnormal; Notable for the following:    Glucose, Bld 139 (*)     BUN 26 (*)    Creatinine, Ser 1.29 (*)    GFR calc non Af Amer 53 (*)    Anion gap 4 (*)    All other components within normal limits   ____________________________________________  EKG  Reviewed and interpreted by me at 1050 Atrial fibrillation Ventricular pacing spikes noted No evidence of acute ST elevation MI, QRS 120 QTc 470 Heart rate 70, paced ____________________________________________  RADIOLOGY  DG Chest 2 View (Final result) Result time: 01/16/16 13:08:17   Final result by Rad Results In Interface (01/16/16 13:08:17)   Narrative:   CLINICAL DATA: Shortness of breath, coughing  EXAM: CHEST 2 VIEW  COMPARISON: 11/20/2014  FINDINGS: There is bilateral interstitial thickening and prominence of the central pulmonary vasculature. There is no pleural effusion or pneumothorax. There is stable cardiomegaly. There is a dual lead cardiac pacemaker. There is evidence of prior CABG.  The osseous structures are unremarkable.  IMPRESSION: Cardiomegaly with mild pulmonary vascular congestion.   Electronically Signed By: Kathreen Devoid On: 01/16/2016 13:08    ____________________________________________   PROCEDURES  Procedure(s) performed: None  Critical Care performed: No  ____________________________________________   INITIAL IMPRESSION / ASSESSMENT AND PLAN / ED COURSE  Pertinent labs & imaging results that were available during my care of the patient were reviewed by me and considered in my medical decision making (see chart for details).  The patient presents for dyspnea. Associated rales in the bases bilaterally with a history of congestive heart failure. No evidence of acute wheezing or significant symptoms suggest COPD type exacerbation. No evidence of acute ischemic abnormality, though the patient is V paced his troponin is very slightly elevated 0.04 without chest pain.  The patient has a history of CHF, chest x-ray shows pulmonary  vascular edema,  and no evidence of infiltrate. The patient does become hypoxic to about 89% when going back in force commode and develops dyspnea. He has relief and saturations in the mid to low 90s on 2 L nasal cannula.  Initiate IV Lasix. Discussed with hospitalist service Dr. Clayton Bibles, we will admit him for further evaluation and anticipation of diuresis. Patient agreeable. ____________________________________________   FINAL CLINICAL IMPRESSION(S) / ED DIAGNOSES  Final diagnoses:  Acute on chronic congestive heart failure, unspecified congestive heart failure type (HCC)      Delman Kitten, MD 01/16/16 1437

## 2016-01-16 NOTE — H&P (Addendum)
[facility] [place] NAME: Rick Gilmore    MR#:  [id number]  DATE OF BIRTH:  November 11, 1941  DATE OF ADMISSION:  01/16/2016  PRIMARY CARE PHYSICIAN: Crecencio Mc, MD   REQUESTING/REFERRING PHYSICIAN:   CHIEF COMPLAINT:   Chief Complaint  Patient presents with  . Shortness of Breath    HISTORY OF PRESENT ILLNESS: Rick Gilmore  is a 75 y.o. male with a known history of coronary artery disease, chronic atrial fibrillation, ischemic cardiomyopathy with ejection fraction of around 35%, according to December 2015 echocardiogram, status post AICD placement in 2009 replacement 2010, COPD, hyperlipidemia, iron deficiency anemia, tubular adenoma, hyperlipidemia who presents to the hospital with complaints of shortness of breath, and the patient, he started feeling short of breath yesterday and especially this morning. He was seen by his primary care physician, Dr. Derrel Nip, who  diagnosed him with CHF exacerbation and recommended to come to emergency room for further evaluation. In emergency room, he was found to be borderline hypoxic with O2 sats of 89% on room air and was initiated on 2 L of oxygen through nasal cannula, he was given Lasix intravenously and hospitalist services were contacted for admission. Chest x-ray revealed cardiomegaly and mild pulmonary vascular congestion and mild elevation of troponin. Patient denied any chest pains.   PAST MEDICAL HISTORY:   Past Medical History  Diagnosis Date  . CAD (coronary artery disease)   . Atrial fibrillation ([place])     on Coumadin  . Hypertension   . Erectile dysfunction   . Hyperlipidemia   . Ischemic cardiomyopathy     s/p AICD/pacer 2009, replaced 2010 [facility]  . Cancer [facility]) 2001    Prostate, XRT and implant  . Tubular adenoma     colon polyp  . Personal history of prostate cancer 2001    s/p XRT and implant ([place])  . Sleep apnea, obstructive     uses CPAP nightly  . Anemia   .  Thrombocytopenia ([place])   . Prostate cancer ([place])   . CHF (congestive heart failure) ([place])   . MI (myocardial infarction) ([place]) 1988  . Arrhythmia     a fib  . COPD (chronic obstructive pulmonary disease) ([place])   . Vitamin B 12 deficiency   . AICD (automatic cardioverter/defibrillator) present   . GERD (gastroesophageal reflux disease)     PAST SURGICAL HISTORY: Past Surgical History  Procedure Laterality Date  . Vasectomy    . Coronary artery bypass graft  1988  . Implantable cardioverter defibrillator generator change  April 2014    Bi V RRR  . Insert / replace / remove pacemaker  2014  . Cardiac catheterization    . Colonoscopy with propofol [name] 05/30/2015    Procedure: COLONOSCOPY WITH PROPOFOL;  Surgeon: Manya Silvas, MD;  Location: [facility] ENDOSCOPY;  Service: Endoscopy;  Laterality: [name];  . Esophagogastroduodenoscopy [name] 05/30/2015    Procedure: ESOPHAGOGASTRODUODENOSCOPY (EGD);  Surgeon: Manya Silvas, MD;  Location: [facility] ENDOSCOPY;  Service: Endoscopy;  Laterality: [name];    SOCIAL HISTORY:  Social History  Substance Use Topics  . Smoking status: Never Smoker   . Smokeless tobacco: Never Used  . Alcohol Use: 1.0 oz/week    2 Standard drinks or equivalent per week    FAMILY HISTORY:  Family History  Problem Relation Age of Onset  . Hypertension Mother   . Hypertension Father   . Cancer Father     prostate, throat  . Emphysema  Father   . Heart disease Maternal Grandmother   . Heart disease Paternal Grandmother     DRUG ALLERGIES: No Known Allergies  Review of Systems  Constitutional: Negative for fever, chills, weight loss and malaise/fatigue.  HENT: Negative for congestion.   Eyes: Negative for blurred vision and double vision.  Respiratory: Positive for shortness of breath and wheezing. Negative for cough and sputum production.   Cardiovascular: Negative for chest pain, palpitations, orthopnea, leg swelling and PND.  Gastrointestinal: Negative for  nausea, vomiting, abdominal pain, diarrhea, constipation, blood in stool and melena.  Genitourinary: Negative for dysuria, urgency, frequency and hematuria.  Musculoskeletal: Negative for falls.  Skin: Negative for rash.  Neurological: Negative for dizziness and weakness.  Psychiatric/Behavioral: Negative for depression and memory loss. The patient is not nervous/anxious.     MEDICATIONS AT HOME:  Prior to Admission medications   Medication Sig Start Date End Date Taking? Authorizing Provider  allopurinol (ZYLOPRIM) 100 MG tablet Take 1 tablet (100 mg total) by mouth daily. 07/21/15  Yes Wallene Huh, DPM  apixaban (ELIQUIS) 5 MG TABS tablet Take 5 mg by mouth 2 (two) times daily.   Yes Historical Provider, MD  colchicine 0.6 MG tablet Take 0.6 mg by mouth daily as needed (for gout flares).    Yes Historical Provider, MD  cyanocobalamin (,VITAMIN B-12,) 1000 MCG/ML injection Inject 1,000 mcg into the muscle every 30 (thirty) days.   Yes Historical Provider, MD  doxazosin (CARDURA) 4 MG tablet Take 4 mg by mouth at bedtime.   Yes Historical Provider, MD  escitalopram (LEXAPRO) 10 MG tablet Take 10 mg by mouth daily.   Yes Historical Provider, MD  esomeprazole (NEXIUM) 40 MG capsule Take 40 mg by mouth daily before breakfast.   Yes Historical Provider, MD  isosorbide mononitrate (IMDUR) 30 MG 24 hr tablet Take 30 mg by mouth daily.   Yes Historical Provider, MD  lisinopril (PRINIVIL,ZESTRIL) 5 MG tablet Take 5 mg by mouth daily.    Yes Historical Provider, MD  metoprolol succinate (TOPROL-XL) 50 MG 24 hr tablet Take 100 mg by mouth daily. Take with or immediately following a meal.   Yes Historical Provider, MD  potassium chloride SA (K-DUR,KLOR-CON) 20 MEQ tablet Take 20 mEq by mouth at bedtime.   Yes Historical Provider, MD  torsemide (DEMADEX) 20 MG tablet Take 40 mg by mouth 2 (two) times daily.   Yes Historical Provider, MD      PHYSICAL EXAMINATION:   VITAL SIGNS: Blood pressure  139/68, pulse 57, temperature 98.6 F (37 C), temperature source Oral, resp. rate 28, height 6\' 3"  (1.905 m), weight 120.203 kg (265 lb), SpO2 95 %.  GENERAL:  75 y.o.-year-old patient lying in the bed with no acute distress.  EYES: Pupils equal, round, reactive to light and accommodation. No scleral icterus. Extraocular muscles intact.  HEENT: Head atraumatic, normocephalic. Oropharynx and nasopharynx clear.  NECK:  Supple, no jugular venous distention. No thyroid enlargement, no tenderness.  LUNGS: Markedly diminished breath sounds bilaterally, no wheezing, basilar rales,rhonchi and crepitations on auscultation. No use of accessory muscles of respiration.  CARDIOVASCULAR: S1, S2 , irregularly irregular. No murmurs, rubs, or gallops.  ABDOMEN: Soft, nontender, nondistended. Bowel sounds present. No organomegaly or mass.  EXTREMITIES: No pedal edema, cyanosis, or clubbing.  NEUROLOGIC: Cranial nerves II through XII are intact. Muscle strength 5/5 in all extremities. Sensation intact. Gait not checked.  PSYCHIATRIC: The patient is alert and oriented x 3.  SKIN: No obvious rash, lesion, or  ulcer.   LABORATORY PANEL:   CBC  Recent Labs Lab 01/16/16 1140  WBC 8.1  HGB 11.6*  HCT 35.0*  PLT 81*  MCV 94.7  MCH 31.3  MCHC 33.1  RDW 15.4*   ------------------------------------------------------------------------------------------------------------------  Chemistries   Recent Labs Lab 01/16/16 1140  NA 135  K 4.8  CL 103  CO2 28  GLUCOSE 139*  BUN 26*  CREATININE 1.29*  CALCIUM 9.2   ------------------------------------------------------------------------------------------------------------------  Cardiac Enzymes  Recent Labs Lab 01/16/16 1140  TROPONINI 0.04*   ------------------------------------------------------------------------------------------------------------------  RADIOLOGY: Dg Chest 2 View  01/16/2016  CLINICAL DATA:  Shortness of breath, coughing EXAM:  CHEST  2 VIEW COMPARISON:  11/20/2014 FINDINGS: There is bilateral interstitial thickening and prominence of the central pulmonary vasculature. There is no pleural effusion or pneumothorax. There is stable cardiomegaly. There is a dual lead cardiac pacemaker. There is evidence of prior CABG. The osseous structures are unremarkable. IMPRESSION: Cardiomegaly with mild pulmonary vascular congestion. Electronically Signed   By: Kathreen Devoid   On: 01/16/2016 13:08    EKG: Orders placed or performed during the hospital encounter of 01/16/16  . EKG 12-Lead  . EKG 12-Lead   EKG 10th of February 2017 revealed A. fib at a rate of 69, normal axis, nonspecific intraventricular conduction delay, diffuse T, ST depressions  IMPRESSION AND PLAN:  Principal Problem:   Acute on chronic systolic CHF (congestive heart failure) (HCC) Active Problems:   Elevated troponin   Respiratory distress, acute (HCC)   Hypoxia   Chronic renal insufficiency   Hyperglycemia  #1. Acute on chronic systolic CHF admitted to a medical floor. Initiate him on Lasix intravenously. Nitroglycerin topically, get cardiology consult, as well as echocardiogram #2. Elevated troponin, likely demand ischemia. Follow cardiac enzymes 3. If cardiologist involved for further recommendations. Get echocardiogram, continue Toprol-XL #3. Chronic renal insufficiency, follow with diuresis #4. Elevated glucose level, check hemoglobin A1c to rule out diabetes   All the records are reviewed and case discussed with ED provider. Management plans discussed with the patient, family and they are in agreement.  CODE STATUS: Code Status History    This patient does not have a recorded code status. Please follow your organizational policy for patients in this situation.    Advance Directive Documentation        Most Recent Value   Type of Advance Directive  Healthcare Power of Attorney   Pre-existing out of facility DNR order (yellow form or pink MOST  form)     "MOST" Form in Place?         TOTAL TIME TAKING CARE OF THIS PATIENT: 55 minutes.    Theodoro Grist M.D on 01/16/2016 at 2:56 PM  Between 7am to 6pm - Pager - [phone] After 6pm go to [email or web address] - password [email or web address] [facility]  Office  [phone]  CC: Primary care physician; Crecencio Mc, MD

## 2016-01-16 NOTE — Progress Notes (Signed)
Subjective:  Patient ID: Rick Gilmore, male    DOB: [birth year]  Age: 75 y.o. MRN: [id number]  CC: The primary encounter diagnosis was Acute respiratory failure, unspecified whether with hypoxia or hypercapnia ([place]). A diagnosis of Acute decompensated heart failure (HCC) was also pertinent to this visit.  HPI Rick Gilmore presents for SHORTNESS OF BREATH WHICH STARTED THIS MORNING.  PATIENT HAS ISCHEMIC CARDIOMYOPATHY,  LAST EF 35%.,  MANAGED WITH 40 MG TORSEMIDE  Two times daily.  [place].  When he got home he noted a 3 lb weight gain.  patietn is very short of breath at rest.  Cardiology office unable to give him IV lasix.     Outpatient Prescriptions Prior to Visit  Medication Sig Dispense Refill  . allopurinol (ZYLOPRIM) 100 MG tablet Take 1 tablet (100 mg total) by mouth daily. 90 tablet 3  . Cetirizine HCl ([employer] ALLERGY RELIEF) 10 MG CAPS Take 1 capsule by mouth daily as needed (allergies).    . colchicine 0.6 MG tablet Take 0.6 mg by mouth daily as needed (gout flare).     . cyanocobalamin (,VITAMIN B-12,) 1000 MCG/ML injection Inject 1,000 mcg into the muscle every 30 (thirty) days.    Marland Kitchen doxazosin (CARDURA) 4 MG tablet TAKE 1 TABLET AT BEDTIME 90 tablet 1  . ELIQUIS 5 MG TABS tablet TAKE 1 TABLET TWICE A DAY 180 tablet 2  . escitalopram (LEXAPRO) 10 MG tablet TAKE 1 TABLET DAILY 90 tablet 2  . isosorbide mononitrate (IMDUR) 30 MG 24 hr tablet Take 30 mg by mouth daily.  11  . KLOR-CON M20 20 MEQ tablet TAKE 1 TABLET DAILY 90 tablet 1  . lisinopril (PRINIVIL,ZESTRIL) 5 MG tablet Take 2.5 mg by mouth 2 (two) times daily.    . metoprolol succinate (TOPROL-XL) 50 MG 24 hr tablet Take 100 mg by mouth daily. Take with or immediately following a meal.    . NEXIUM 40 MG capsule TAKE 1 CAPSULE DAILY BEFORE BREAKFAST 90 capsule 2  . torsemide (DEMADEX) 20 MG tablet Take 1 tablet (20 mg total) by mouth daily. (Patient taking differently: Take 40 mg by mouth  2 (two) times daily. ) 90 tablet 1   No facility-administered medications prior to visit.    Review of Systems;  Patient denies headache, fevers, malaise, unintentional weight loss, skin rash, eye pain, sinus congestion and sinus pain, sore throat, dysphagia,  hemoptysis , cough, dyspnea, wheezing, chest pain, palpitations, orthopnea, edema, abdominal pain, nausea, melena, diarrhea, constipation, flank pain, dysuria, hematuria, urinary  Frequency, nocturia, numbness, tingling, seizures,  Focal weakness, Loss of consciousness,  Tremor, insomnia, depression, anxiety, and suicidal ideation.      Objective:  BP 128/68 mmHg  Pulse 69  Temp(Src) 98.2 F (36.8 C) (Oral)  Resp 30  Ht 6\' 3"  (1.905 m)  Wt 270 lb 8 oz (122.698 kg)  BMI 33.81 kg/m2  SpO2 97%  BP Readings from Last 3 Encounters:  01/16/16 128/68  01/08/16 106/47  11/06/15 128/68    Wt Readings from Last 3 Encounters:  01/16/16 270 lb 8 oz (122.698 kg)  01/08/16 266 lb (120.657 kg)  11/06/15 255 lb (115.667 kg)    General appearance: alert, cooperative and appears stated age. NOT SPEAKING IN FULL SENTENCES DUE TO DYSPNEA  neck: no adenopathy, no carotid bruit, supple, symmetrical, trachea midline and thyroid not enlarged, symmetric, no tenderness/mass/nodules Back: symmetric, no curvature. ROM normal. No CVA tenderness. Lungs: bilateral wheezes in  all lung fields Heart: regular rate and rhythm, S1, S2 normal, no murmur, click, rub or gallop Abdomen: soft, non-tender; bowel sounds normal; no masses,  no organomegaly Pulses: 2+ and symmetric Skin: Skin color, texture, 1+ edema. No rashes or lesions Lymph nodes: Cervical, supraclavicular, and axillary nodes normal.  Lab Results  Component Value Date   HGBA1C 5.9 11/23/2014   HGBA1C 5.9 11/21/2014   HGBA1C 5.6 06/24/2014    Lab Results  Component Value Date   CREATININE 1.20 10/28/2015   CREATININE 1.02 10/15/2015   CREATININE 1.15 04/24/2015    Lab Results    Component Value Date   WBC 5.9 10/28/2015   HGB 11.3* 10/28/2015   HCT 34.2* 10/28/2015   PLT 128.0* 10/28/2015   GLUCOSE 97 10/28/2015   CHOL 135 10/28/2015   TRIG 74.0 10/28/2015   HDL 35.20* 10/28/2015   LDLDIRECT 59.9 11/05/2011   LDLCALC 85 10/28/2015   ALT 12 10/28/2015   AST 17 10/28/2015   NA 139 10/28/2015   K 4.2 10/28/2015   CL 103 10/28/2015   CREATININE 1.20 10/28/2015   BUN 30* 10/28/2015   CO2 28 10/28/2015   TSH 1.82 01/22/2015   INR 2.3 06/17/2014   HGBA1C 5.9 11/23/2014    No results found.  Assessment & Plan:   Problem List Items Addressed This Visit    Respiratory failure, acute ([place]) - Primary   Acute decompensated heart failure (HCC)    PATIENT NEEDS IV LASIX THERAPY , ISCHEMIA RULE OUT AND RESP SUPPORT. SENDING TO ER.  CARDIOLOGY OFFICE CONTACTED,  DR Erin Fulling IS OUT OF THE OFFICE          I am having Mr. Barefield maintain his cyanocobalamin, isosorbide mononitrate, lisinopril, colchicine, metoprolol succinate, Cetirizine HCl, allopurinol, KLOR-CON M20, escitalopram, ELIQUIS, NEXIUM, doxazosin, and torsemide.  No orders of the defined types were placed in this encounter.    There are no discontinued medications.  Follow-up: No Follow-up on file.   Crecencio Mc, MD

## 2016-01-16 NOTE — Progress Notes (Signed)
*  PRELIMINARY RESULTS* Echocardiogram 2D Echocardiogram has been performed.  Rick Gilmore 01/16/2016, 6:50 PM

## 2016-01-16 NOTE — ED Notes (Signed)
Ems states pt was complaining of SOB that started yesterday. Pt was being seen at [facility] this morning and sent here for further evaluation. Pt states he became SOB yesterday and has gotten worse today. Pt denies recent cold or sickness. Pt does have history of COPD but does not wear oxygen at home.

## 2016-01-17 LAB — URINALYSIS COMPLETE WITH MICROSCOPIC (ARMC ONLY)
Bacteria, UA: NONE SEEN
Bilirubin Urine: NEGATIVE
Glucose, UA: NEGATIVE mg/dL
Hgb urine dipstick: NEGATIVE
Ketones, ur: NEGATIVE mg/dL
Leukocytes, UA: NEGATIVE
Nitrite: NEGATIVE
Protein, ur: NEGATIVE mg/dL
Specific Gravity, Urine: 1.014 (ref 1.005–1.030)
pH: 5 (ref 5.0–8.0)

## 2016-01-17 LAB — CBC
HCT: 33.8 % — ABNORMAL LOW (ref 40.0–52.0)
Hemoglobin: 11.2 g/dL — ABNORMAL LOW (ref 13.0–18.0)
MCH: 31.4 pg (ref 26.0–34.0)
MCHC: 33.1 g/dL (ref 32.0–36.0)
MCV: 94.9 fL (ref 80.0–100.0)
Platelets: 74 10*3/uL — ABNORMAL LOW (ref 150–440)
RBC: 3.56 MIL/uL — ABNORMAL LOW (ref 4.40–5.90)
RDW: 15.5 % — ABNORMAL HIGH (ref 11.5–14.5)
WBC: 6.2 10*3/uL (ref 3.8–10.6)

## 2016-01-17 LAB — BASIC METABOLIC PANEL WITH GFR
Anion gap: 6 (ref 5–15)
BUN: 29 mg/dL — ABNORMAL HIGH (ref 6–20)
CO2: 27 mmol/L (ref 22–32)
Calcium: 8.9 mg/dL (ref 8.9–10.3)
Chloride: 104 mmol/L (ref 101–111)
Creatinine, Ser: 1.25 mg/dL — ABNORMAL HIGH (ref 0.61–1.24)
GFR calc Af Amer: >60 mL/min
GFR calc non Af Amer: 55 mL/min — ABNORMAL LOW
Glucose, Bld: 109 mg/dL — ABNORMAL HIGH (ref 65–99)
Potassium: 3.9 mmol/L (ref 3.5–5.1)
Sodium: 137 mmol/L (ref 135–145)

## 2016-01-17 LAB — TROPONIN I: Troponin I: 0.06 ng/mL — ABNORMAL HIGH (ref <0.031)

## 2016-01-17 LAB — HEMOGLOBIN A1C: Hgb A1c MFr Bld: 5.1 % (ref 4.0–6.0)

## 2016-01-17 MED ORDER — BUDESONIDE-FORMOTEROL FUMARATE 160-4.5 MCG/ACT IN AERO
2.0000 | INHALATION_SPRAY | Freq: Two times a day (BID) | RESPIRATORY_TRACT | Status: DC
  Administered 2016-01-17 – 2016-01-19 (×5): 2 via RESPIRATORY_TRACT
  Filled 2016-01-17: qty 6

## 2016-01-17 MED ORDER — FUROSEMIDE 10 MG/ML IJ SOLN: 40.0000 mg | Freq: Two times a day (BID) | INTRAMUSCULAR | Status: DC

## 2016-01-17 MED ORDER — FUROSEMIDE 10 MG/ML IJ SOLN
40.0000 mg | Freq: Every day | INTRAMUSCULAR | Status: DC
  Administered 2016-01-17 – 2016-01-18 (×2): 40 mg via INTRAVENOUS
  Filled 2016-01-17 (×2): qty 4

## 2016-01-17 MED ORDER — IPRATROPIUM-ALBUTEROL 0.5-2.5 (3) MG/3ML IN SOLN
3.0000 mL | RESPIRATORY_TRACT | Status: DC
  Administered 2016-01-17 – 2016-01-18 (×7): 3 mL via RESPIRATORY_TRACT
  Filled 2016-01-17 (×8): qty 3

## 2016-01-17 MED ORDER — MENTHOL 3 MG MT LOZG
1.0000 | LOZENGE | OROMUCOSAL | Status: DC | PRN
  Filled 2016-01-17: qty 9

## 2016-01-17 MED ORDER — METHYLPREDNISOLONE SODIUM SUCC 40 MG IJ SOLR
40.0000 mg | Freq: Every day | INTRAMUSCULAR | Status: DC
  Administered 2016-01-17 – 2016-01-19 (×3): 40 mg via INTRAVENOUS
  Filled 2016-01-17 (×3): qty 1

## 2016-01-17 NOTE — Progress Notes (Signed)
Patient morning BP low. Medication held. MD notified. Acknowledged. No new orders given. Patient told me and Student RN he coughed up blood in sputum during night. Coughed off blood in sputum during morning med pass as well. Streaked blood in sputum. Acknowledged. New orders given. Will continue to monitor.

## 2016-01-17 NOTE — Consult Note (Signed)
Rick Gilmore  CARDIOLOGY CONSULT NOTE  Patient ID: Rick Gilmore MRN: [id number] DOB/AGE: [birth year] 75 y.o.  Admit date: 01/16/2016 Referring Physician Dr. Ether Griffins Primary Physician   Primary Cardiologist Dr. Nehemiah Massed Reason for Consultation chf exacerbation  HPI: Pt is a 75 yo male with history of ischemic cardiomyopathy with ef of 35%, history of biv aicd in place, history of atrial fibrillation anticoagulated with apixiban, cad s/p cabg with a LIMA-LAD, SVG-D1, SVG-OM1 in 1988 with a stent in the ovg to the om1 who was admitted after several days of increasing sob and a 3-5 pound weight gain. He is treated with elizuis 5 mg bid, imdur 30 mg daily, lisinopril 5 mg daily, aldactone 25 mg daily, metoprolol succ 500 mg daily. He is on cardura and is on demadex 20 mg daily for diuresis. He was doing well until approximately 1 week ago. He has been compliant with his meds [place] and admits to eating a high sodium diet during his visit. He denies any chest pain. Echo reveals ef of 30-35% similar to his previous echo. He has ruled out for an mi with no significant troponin elevation. CXR showed mild pulmonary edema.    ROS Review of Systems - History obtained from chart review and the patient General ROS: positive for  - sob and weight gain Respiratory ROS: positive for - shortness of breath Cardiovascular ROS: positive for - dyspnea on exertion and shortness of breath Gastrointestinal ROS: no abdominal pain, change in bowel habits, or black or bloody stools Musculoskeletal ROS: negative Neurological ROS: no TIA or stroke symptoms   Past Medical History  Diagnosis Date  . CAD (coronary artery disease)   . Atrial fibrillation ([place])     on Coumadin  . Hypertension   . Erectile dysfunction   . Hyperlipidemia   . Ischemic cardiomyopathy     s/p AICD/pacer 2009, replaced 2010 [facility]  . Cancer [facility]) 2001    Prostate, XRT and implant  .  Tubular adenoma     colon polyp  . Personal history of prostate cancer 2001    s/p XRT and implant ([place])  . Sleep apnea, obstructive     uses CPAP nightly  . Anemia   . Thrombocytopenia ([place])   . Prostate cancer ([place])   . CHF (congestive heart failure) ([place])   . MI (myocardial infarction) ([place]) 1988  . Arrhythmia     a fib  . COPD (chronic obstructive pulmonary disease) ([place])   . Vitamin B 12 deficiency   . AICD (automatic cardioverter/defibrillator) present   . GERD (gastroesophageal reflux disease)     Family History  Problem Relation Age of Onset  . Hypertension Mother   . Hypertension Father   . Cancer Father     prostate, throat  . Emphysema Father   . Heart disease Maternal Grandmother   . Heart disease Paternal Grandmother     Social History   Social History  . Marital Status: Single    Spouse Name: [name]  . Number of Children: [name]  . Years of Education: [name]   Occupational History  . Not on file.   Social History Main Topics  . Smoking status: Never Smoker   . Smokeless tobacco: Never Used  . Alcohol Use: 1.0 oz/week    2 Standard drinks or equivalent per week  . Drug Use: No  . Sexual Activity: Not on file   Other Topics Concern  . Not on  file   Social History Narrative    Past Surgical History  Procedure Laterality Date  . Vasectomy    . Coronary artery bypass graft  1988  . Implantable cardioverter defibrillator generator change  April 2014    Bi V RRR  . Insert / replace / remove pacemaker  2014  . Cardiac catheterization    . Colonoscopy with propofol [name] 05/30/2015    Procedure: COLONOSCOPY WITH PROPOFOL;  Surgeon: Manya Silvas, MD;  Location: [facility] ENDOSCOPY;  Service: Endoscopy;  Laterality: [name];  . Esophagogastroduodenoscopy [name] 05/30/2015    Procedure: ESOPHAGOGASTRODUODENOSCOPY (EGD);  Surgeon: Manya Silvas, MD;  Location: [facility] ENDOSCOPY;  Service: Endoscopy;  Laterality: [name];     Prescriptions prior to admission  Medication Sig  Dispense Refill Last Dose  . allopurinol (ZYLOPRIM) 100 MG tablet Take 1 tablet (100 mg total) by mouth daily. 90 tablet 3 01/15/2016 at Unknown time  . apixaban (ELIQUIS) 5 MG TABS tablet Take 5 mg by mouth 2 (two) times daily.   01/15/2016 at 2000  . colchicine 0.6 MG tablet Take 0.6 mg by mouth daily as needed (for gout flares).    Past Month at Unknown time  . cyanocobalamin (,VITAMIN B-12,) 1000 MCG/ML injection Inject 1,000 mcg into the muscle every 30 (thirty) days.   2 months ago at unknown   . doxazosin (CARDURA) 4 MG tablet Take 4 mg by mouth at bedtime.   01/15/2016 at Unknown time  . escitalopram (LEXAPRO) 10 MG tablet Take 10 mg by mouth daily.   01/15/2016 at Unknown time  . esomeprazole (NEXIUM) 40 MG capsule Take 40 mg by mouth daily before breakfast.   01/15/2016 at Unknown time  . isosorbide mononitrate (IMDUR) 30 MG 24 hr tablet Take 30 mg by mouth daily.  11 01/15/2016 at Unknown time  . lisinopril (PRINIVIL,ZESTRIL) 5 MG tablet Take 5 mg by mouth daily.    01/15/2016 at Unknown time  . metoprolol succinate (TOPROL-XL) 50 MG 24 hr tablet Take 100 mg by mouth daily. Take with or immediately following a meal.   01/15/2016 at 0800  . potassium chloride SA (K-DUR,KLOR-CON) 20 MEQ tablet Take 20 mEq by mouth at bedtime.   01/15/2016 at Unknown time  . torsemide (DEMADEX) 20 MG tablet Take 40 mg by mouth 2 (two) times daily.   01/16/2016 at Unknown time    Physical Exam: Blood pressure 105/50, pulse 60, temperature 97.8 F (36.6 C), temperature source Oral, resp. rate 16, height 6\' 3"  (1.905 m), weight 119.659 kg (263 lb 12.8 oz), SpO2 97 %.   General appearance: alert and cooperative Neck: no adenopathy, no carotid bruit, no JVD, supple, symmetrical, trachea midline and thyroid not enlarged, symmetric, no tenderness/mass/nodules Resp: diminished breath sounds bibasilar Cardio: irregularly irregular rhythm and atrial fibrillation with predominant v paced GI: soft, non-tender; bowel sounds normal;  no masses,  no organomegaly Extremities: edema 2+ edema in lower extremeties bilateral Neurologic: Grossly normal Labs:   Lab Results  Component Value Date   WBC 6.2 01/17/2016   HGB 11.2* 01/17/2016   HCT 33.8* 01/17/2016   MCV 94.9 01/17/2016   PLT 74* 01/17/2016    Recent Labs Lab 01/17/16 0419  NA 137  K 3.9  CL 104  CO2 27  BUN 29*  CREATININE 1.25*  CALCIUM 8.9  GLUCOSE 109*   Lab Results  Component Value Date   TROPONINI 0.06* 01/17/2016      Radiology: Cardiomegaly with mild pulmonary vascular congestion EKG: A fib with  ventricular paced rhythm  ASSESSMENT AND PLAN:  75 yo male with ischemic cardiomyopathy with ef of 30-35% in the past confirmed by echo yesterday with biv aicd in place who is on a good regimend of evidence based chf meds as outpatient admitted with mild exacerbation of chf. Likely due to dietary indescretion while and [place]. Ruled out for mi. WIll continue apixiban for his afib and metoprolol succ, imdur, lisinpril and spironolactone. Relatively hypotensive so will hold cardura for now and follow urine flow and hemodynamics. Increase iv lasix to 40 mg bid. Follow daily weight and i/0. Was -400 over night. Ruled out for mi. Elevated troponin was minimal and due to chf.  Signed: Teodoro Spray MD, [facility] 01/17/2016, 8:35 AM

## 2016-01-17 NOTE — Progress Notes (Addendum)
[place] at [place] NAME: Rick Gilmore    MR#:  [id number]  DATE OF BIRTH:  1941/11/02  SUBJECTIVE:  CHIEF COMPLAINT:   Chief Complaint  Patient presents with  . Shortness of Breath   - Still feels short of breath, some scattered wheezing - not on home o2, now on 4L o2 - admitted for CHF  REVIEW OF SYSTEMS:  Review of Systems  Constitutional: Positive for malaise/fatigue. Negative for fever and chills.  HENT: Negative for ear discharge, ear pain and nosebleeds.   Eyes: Negative for blurred vision.  Respiratory: Positive for cough, sputum production, shortness of breath and wheezing.   Cardiovascular: Negative for chest pain, palpitations and leg swelling.  Gastrointestinal: Negative for nausea, vomiting, abdominal pain, diarrhea and constipation.  Genitourinary: Negative for dysuria.  Musculoskeletal: Negative for myalgias, back pain and joint pain.  Neurological: Negative for dizziness, sensory change, speech change, focal weakness, seizures and headaches.  Psychiatric/Behavioral: Negative for depression.    DRUG ALLERGIES:  No Known Allergies  VITALS:  Blood pressure 105/50, pulse 60, temperature 97.8 F (36.6 C), temperature source Oral, resp. rate 16, height 6\' 3"  (1.905 m), weight 119.659 kg (263 lb 12.8 oz), SpO2 97 %.  PHYSICAL EXAMINATION:  Physical Exam  GENERAL:  75 y.o.-year-old patient lying in the bed with no acute distress.  EYES: Pupils equal, round, reactive to light and accommodation. No scleral icterus. Extraocular muscles intact.  HEENT: Head atraumatic, normocephalic. Oropharynx and nasopharynx clear.  NECK:  Supple, no jugular venous distention. No thyroid enlargement, no tenderness.  LUNGS: scattered expiratory wheezing, No rales,rhonchi or crepitation. No use of accessory muscles of respiration.  CARDIOVASCULAR: S1, S2 normal. No murmurs, rubs, or gallops.  ABDOMEN: Soft, nontender, nondistended.  Bowel sounds present. No organomegaly or mass.  EXTREMITIES: No pedal edema, cyanosis, or clubbing.  NEUROLOGIC: Cranial nerves II through XII are intact. Muscle strength 5/5 in all extremities. Sensation intact. Gait not checked.  PSYCHIATRIC: The patient is alert and oriented x 3.  SKIN: No obvious rash, lesion, or ulcer.    LABORATORY PANEL:   CBC  Recent Labs Lab 01/17/16 0419  WBC 6.2  HGB 11.2*  HCT 33.8*  PLT 74*   ------------------------------------------------------------------------------------------------------------------  Chemistries   Recent Labs Lab 01/17/16 0419  NA 137  K 3.9  CL 104  CO2 27  GLUCOSE 109*  BUN 29*  CREATININE 1.25*  CALCIUM 8.9   ------------------------------------------------------------------------------------------------------------------  Cardiac Enzymes  Recent Labs Lab 01/17/16 0419  TROPONINI 0.06*   ------------------------------------------------------------------------------------------------------------------  RADIOLOGY:  Dg Chest 2 View  01/16/2016  CLINICAL DATA:  Shortness of breath, coughing EXAM: CHEST  2 VIEW COMPARISON:  11/20/2014 FINDINGS: There is bilateral interstitial thickening and prominence of the central pulmonary vasculature. There is no pleural effusion or pneumothorax. There is stable cardiomegaly. There is a dual lead cardiac pacemaker. There is evidence of prior CABG. The osseous structures are unremarkable. IMPRESSION: Cardiomegaly with mild pulmonary vascular congestion. Electronically Signed   By: Kathreen Devoid   On: 01/16/2016 13:08    EKG:   Orders placed or performed during the hospital encounter of 01/16/16  . EKG 12-Lead  . EKG 12-Lead    ASSESSMENT AND PLAN:   75y/o M with PMH of CAD, afib on eliquis, CHF with EF 35%, I pretension, GERD, COPD, anemia and thrombocytopenia presents to the hospital from PCP office secondary to worsening shortness of breath.  #1 acute on chronic  systolic CHF exacerbation-patient has  been compliant with his diet and fluid restriction at home. He is also on torsemide 40 mg twice a day at home. -He says he's been feeling short of breath for the last couple of days that worsened yesterday and went to see his PCP and was sent in here. -Started on Lasix IV twice a day. Blood pressure low this morning, so Lasix held -Also scattered wheezing on exam. Known history of COPD. We'll start DuoNeb's and also steroids for now. -Chest x-ray on admission showing pulmonary vascular congestion.  #2 hypoxic respiratory failure-secondary to CHF exacerbation. -Not on any home oxygen, currently requiring 4 L this morning. Continue to wean oxygen as tolerated.  #3 hypertension-hold all his blood pressure medications this morning including lisinopril, Imdur, Norvasc, Lasix and metoprolol due to hypotension.  #4 chronic atrial fibrillation-rate is controlled. Metoprolol on hold. -Continue eliquis  #5 anemia and thrombocytopenia-continue to monitor while on anticoagulation. Platelets have dropped from 80,000-74,000.  #6 DVT prophylaxis-on eliquis  #7 Fever and low BP now- r/o infection, CXR with no infiltrate - check UA   Physical therapy consult when able to.   All the records are reviewed and case discussed with Care Management/Social Workerr. Management plans discussed with the patient, family and they are in agreement.  CODE STATUS: Full Code   TOTAL TIME TAKING CARE OF THIS PATIENT: 40 minutes.   POSSIBLE D/C IN 2 DAYS, DEPENDING ON CLINICAL CONDITION.   Gladstone Lighter M.D on 01/17/2016 at 8:53 AM  Between 7am to 6pm - Pager - [phone]  After 6pm go to [email or web address] - password [email or web address]  Office  [phone]  CC: Primary care physician; Crecencio Mc, MD

## 2016-01-18 ENCOUNTER — Inpatient Hospital Stay: Payer: Medicare Other

## 2016-01-18 LAB — BASIC METABOLIC PANEL
Anion gap: 5 (ref 5–15)
BUN: 33 mg/dL — ABNORMAL HIGH (ref 6–20)
CO2: 27 mmol/L (ref 22–32)
Calcium: 8.6 mg/dL — ABNORMAL LOW (ref 8.9–10.3)
Chloride: 103 mmol/L (ref 101–111)
Creatinine, Ser: 1.1 mg/dL (ref 0.61–1.24)
GFR calc Af Amer: >60 mL/min (ref >60)
GFR calc non Af Amer: >60 mL/min (ref >60)
Glucose, Bld: 112 mg/dL — ABNORMAL HIGH (ref 65–99)
Potassium: 3.6 mmol/L (ref 3.5–5.1)
Sodium: 135 mmol/L (ref 135–145)

## 2016-01-18 MED ORDER — TORSEMIDE 20 MG PO TABS
40.0000 mg | ORAL_TABLET | Freq: Two times a day (BID) | ORAL | Status: DC
  Administered 2016-01-18 – 2016-01-19 (×2): 40 mg via ORAL
  Filled 2016-01-18 (×2): qty 2

## 2016-01-18 NOTE — Progress Notes (Signed)
[facility] [place] NAME: Rick Gilmore    MR#:  [id number]  DATE OF BIRTH:  03/27/1941  SUBJECTIVE:  CHIEF COMPLAINT:   Chief Complaint  Patient presents with  . Shortness of Breath   - Still feels short of breath, some scattered wheezing - not on home o2, now on 4L o2 - admitted for CHF  REVIEW OF SYSTEMS:  Review of Systems  Constitutional: Positive for malaise/fatigue. Negative for fever and chills.  HENT: Negative for ear discharge, ear pain and nosebleeds.   Eyes: Negative for blurred vision.  Respiratory: Positive for cough, sputum production, shortness of breath and wheezing.   Cardiovascular: Negative for chest pain, palpitations and leg swelling.  Gastrointestinal: Negative for nausea, vomiting, abdominal pain, diarrhea and constipation.  Genitourinary: Negative for dysuria.  Musculoskeletal: Negative for myalgias, back pain and joint pain.  Neurological: Negative for dizziness, sensory change, speech change, focal weakness, seizures and headaches.  Psychiatric/Behavioral: Negative for depression.    DRUG ALLERGIES:  No Known Allergies  VITALS:  Blood pressure 96/46, pulse 65, temperature 97.7 F (36.5 C), temperature source Oral, resp. rate 19, height 6\' 3"  (1.905 m), weight 119.976 kg (264 lb 8 oz), SpO2 98 %.  PHYSICAL EXAMINATION:  Physical Exam  GENERAL:  75 y.o.-year-old patient lying in the bed with no acute distress.  EYES: Pupils equal, round, reactive to light and accommodation. No scleral icterus. Extraocular muscles intact.  HEENT: Head atraumatic, normocephalic. Oropharynx and nasopharynx clear.  NECK:  Supple, no jugular venous distention. No thyroid enlargement, no tenderness.  LUNGS: scattered expiratory wheezing, No rales,rhonchi or crepitation. No use of accessory muscles of respiration.  CARDIOVASCULAR: S1, S2 normal. No murmurs, rubs, or gallops.  ABDOMEN: Soft, nontender, nondistended.  Bowel sounds present. No organomegaly or mass.  EXTREMITIES: No pedal edema, cyanosis, or clubbing.  NEUROLOGIC: Cranial nerves II through XII are intact. Muscle strength 5/5 in all extremities. Sensation intact. Gait not checked.  PSYCHIATRIC: The patient is alert and oriented x 3.  SKIN: No obvious rash, lesion, or ulcer.    LABORATORY PANEL:   CBC  Recent Labs Lab 01/17/16 0419  WBC 6.2  HGB 11.2*  HCT 33.8*  PLT 74*   ------------------------------------------------------------------------------------------------------------------  Chemistries   Recent Labs Lab 01/18/16 0627  NA 135  K 3.6  CL 103  CO2 27  GLUCOSE 112*  BUN 33*  CREATININE 1.10  CALCIUM 8.6*   ------------------------------------------------------------------------------------------------------------------  Cardiac Enzymes  Recent Labs Lab 01/17/16 0419  TROPONINI 0.06*   ------------------------------------------------------------------------------------------------------------------  RADIOLOGY:  Dg Chest 2 View  01/18/2016  CLINICAL DATA:  75 year old male with cough and shortness of breath. EXAM: CHEST  2 VIEW COMPARISON:  01/16/2016 and prior exams FINDINGS: Cardiomegaly, CABG changes and left-sided ICD/pacemaker noted. Pulmonary vascular congestion has decreased. Very small bilateral pleural effusions are noted. There is no evidence of airspace disease or pneumothorax there IMPRESSION: Cardiomegaly with decreased pulmonary vascular congestion. Very small bilateral pleural effusions. Electronically Signed   By: Margarette Canada M.D.   On: 01/18/2016 10:02   Dg Chest 2 View  01/16/2016  CLINICAL DATA:  Shortness of breath, coughing EXAM: CHEST  2 VIEW COMPARISON:  11/20/2014 FINDINGS: There is bilateral interstitial thickening and prominence of the central pulmonary vasculature. There is no pleural effusion or pneumothorax. There is stable cardiomegaly. There is a dual lead cardiac pacemaker.  There is evidence of prior CABG. The osseous structures are unremarkable. IMPRESSION: Cardiomegaly with mild pulmonary vascular congestion.  Electronically Signed   By: Kathreen Devoid   On: 01/16/2016 13:08    EKG:   Orders placed or performed during the hospital encounter of 01/16/16  . EKG 12-Lead  . EKG 12-Lead    ASSESSMENT AND PLAN:   75y/o M with PMH of CAD, afib on eliquis, CHF with EF 35%, I pretension, GERD, COPD, anemia and thrombocytopenia presents to the hospital from PCP office secondary to worsening shortness of breath.  #1 acute on chronic systolic CHF exacerbation-patient has been non-compliant with his diet with his recent beach trip and fluid restriction at home.  - change lasix to home dose of torsemide today Blood pressure low this morning- monitor  #2 COPD exacerbation- cont steroids and nebs- improving - wean o2 as tolerated- not on home oxygen  We'll start DuoNeb's and also steroids for now. -Chest x-ray on admission showing pulmonary vascular congestion. Repeat CXR with improvement - discharge on inhalers  #2 hypoxic respiratory failure-secondary to CHF exacerbation. -Not on any home oxygen, requiring 4 L on admission- continue to wean as tolerated.  #3 hypertension-hold all his blood pressure medications this morning including lisinopril, Imdur, Norvasc and metoprolol due to hypotension. Adjust meds at discharge  #4 chronic atrial fibrillation-rate is controlled. Metoprolol on hold. -Continue eliquis  #5 anemia and thrombocytopenia-continue to monitor while on anticoagulation. Platelets have dropped from 80,000-74,000.  #6 DVT prophylaxis-on eliquis   Possible discharge tomorrow.   All the records are reviewed and case discussed with Care Management/Social Workerr. Management plans discussed with the patient, family and they are in agreement.  CODE STATUS: Full Code   TOTAL TIME TAKING CARE OF THIS PATIENT: 40 minutes.   POSSIBLE D/C TOMORROW,  DEPENDING ON CLINICAL CONDITION.   Gladstone Lighter M.D on 01/18/2016 at 12:31 PM  Between 7am to 6pm - Pager - [phone]  After 6pm go to [email or web address] - password [email or web address] [facility]  Office  [phone]  CC: Primary care physician; Crecencio Mc, MD

## 2016-01-18 NOTE — Progress Notes (Signed)
Patient weaned off oxygen and SPO2 is 94 on room air. Will continue to monitor.

## 2016-01-18 NOTE — Progress Notes (Signed)
Rick Gilmore  SUBJECTIVE: improved with diuresis. Pulse ox 94% off of oxygen.    Filed Vitals:   01/18/16 0358 01/18/16 0548 01/18/16 0932 01/18/16 1042  BP: 126/62   96/46  Pulse: 60   65  Temp: 97.6 F (36.4 C)   97.7 F (36.5 C)  TempSrc: Oral   Oral  Resp: 22   19  Height:      Weight:  119.976 kg (264 lb 8 oz)    SpO2: 97%  97% 98%    Intake/Output Summary (Last 24 hours) at 01/18/16 1931 Last data filed at 01/18/16 1742  Gross per 24 hour  Intake    840 ml  Output   1750 ml  Net   -910 ml    LABS: Basic Metabolic Panel:  Recent Labs  01/17/16 0419 01/18/16 0627  NA 137 135  K 3.9 3.6  CL 104 103  CO2 27 27  GLUCOSE 109* 112*  BUN 29* 33*  CREATININE 1.25* 1.10  CALCIUM 8.9 8.6*   Liver Function Tests: No results for input(s): AST, ALT, ALKPHOS, BILITOT, PROT, ALBUMIN in the last 72 hours. No results for input(s): LIPASE, AMYLASE in the last 72 hours. CBC:  Recent Labs  01/16/16 1140 01/17/16 0419  WBC 8.1 6.2  HGB 11.6* 11.2*  HCT 35.0* 33.8*  MCV 94.7 94.9  PLT 81* 74*   Cardiac Enzymes:  Recent Labs  01/16/16 1551 01/16/16 2144 01/17/16 0419  TROPONINI 0.06* 0.09* 0.06*   BNP: Invalid input(s): POCBNP D-Dimer: No results for input(s): DDIMER in the last 72 hours. Hemoglobin A1C:  Recent Labs  01/16/16 1551  HGBA1C 5.1   Fasting Lipid Panel: No results for input(s): CHOL, HDL, LDLCALC, TRIG, CHOLHDL, LDLDIRECT in the last 72 hours. Thyroid Function Tests: No results for input(s): TSH, T4TOTAL, T3FREE, THYROIDAB in the last 72 hours.  Invalid input(s): FREET3 Anemia Panel: No results for input(s): VITAMINB12, FOLATE, FERRITIN, TIBC, IRON, RETICCTPCT in the last 72 hours.   Physical Exam: Blood pressure 96/46, pulse 65, temperature 97.7 F (36.5 C), temperature source Oral, resp. rate 19, height 6\' 3"  (1.905 m), weight 119.976 kg (264 lb 8 oz), SpO2 98 %.    General appearance: alert  and cooperative Resp: clear to auscultation bilaterally Cardio: regular rate and rhythm GI: soft, non-tender; bowel sounds normal; no masses,  no organomegaly Neurologic: Grossly normal  TELEMETRY: Reviewed telemetry pt in nsr:  ASSESSMENT AND PLAN:  Principal Problem:   Acute on chronic systolic CHF (congestive heart failure) (HCC)-improved with diuresis. Will continue off of oxygen and convert to po diuresis. Exaacerbation likely scondary to dietary indiscretion Active Problems:   Elevated troponin-elevated troponin. No evidence of nstemi   Respiratory distress, acute ([place])   Hypoxia   Chronic renal insufficiency   Hyperglycemia    Teodoro Spray., MD, [facility] 01/18/2016 7:31 PM

## 2016-01-18 NOTE — Care Management (Signed)
Presents from home with congestive heart failure.  he does not have chronic home 02 and will need to be assessed for home 02 prio to discharge.  Patient will benefit from heart failure and thn referral.  He does not have frequent presentations for ED/admission in epic.He is on chronic eliquis

## 2016-01-19 LAB — BASIC METABOLIC PANEL
Anion gap: 4 — ABNORMAL LOW (ref 5–15)
BUN: 33 mg/dL — ABNORMAL HIGH (ref 6–20)
CO2: 28 mmol/L (ref 22–32)
Calcium: 8.6 mg/dL — ABNORMAL LOW (ref 8.9–10.3)
Chloride: 105 mmol/L (ref 101–111)
Creatinine, Ser: 1.12 mg/dL (ref 0.61–1.24)
GFR calc Af Amer: >60 mL/min (ref >60)
GFR calc non Af Amer: >60 mL/min (ref >60)
Glucose, Bld: 110 mg/dL — ABNORMAL HIGH (ref 65–99)
Potassium: 3.9 mmol/L (ref 3.5–5.1)
Sodium: 137 mmol/L (ref 135–145)

## 2016-01-19 LAB — CBC
HCT: 31.5 % — ABNORMAL LOW (ref 40.0–52.0)
Hemoglobin: 10.6 g/dL — ABNORMAL LOW (ref 13.0–18.0)
MCH: 31.9 pg (ref 26.0–34.0)
MCHC: 33.7 g/dL (ref 32.0–36.0)
MCV: 94.5 fL (ref 80.0–100.0)
Platelets: 83 10*3/uL — ABNORMAL LOW (ref 150–440)
RBC: 3.33 MIL/uL — ABNORMAL LOW (ref 4.40–5.90)
RDW: 15.4 % — ABNORMAL HIGH (ref 11.5–14.5)
WBC: 3.9 10*3/uL (ref 3.8–10.6)

## 2016-01-19 MED ORDER — IPRATROPIUM-ALBUTEROL 0.5-2.5 (3) MG/3ML IN SOLN: 3.0000 mL | RESPIRATORY_TRACT | Status: DC | PRN

## 2016-01-19 NOTE — Progress Notes (Signed)
SATURATION QUALIFICATIONS: (This note is used to comply with regulatory documentation for home oxygen)  Patient Saturations on Room Air at Rest = 95%  Patient Saturations on Room Air while Ambulating = 93%  Patient Saturations on 0 Liters of oxygen while Ambulating = na%  Please briefly explain why patient needs home oxygen:na

## 2016-01-19 NOTE — Care Management (Signed)
Informed that patient did not qualify for home 02 at discharge

## 2016-01-19 NOTE — Progress Notes (Signed)
Patient discharged via wheelchair and private vehicle. IV removed and catheter intact. All discharge instructions given and patient verbalizes understanding. Tele removed and returned. No prescriptions given to patient No distress noted.   

## 2016-01-19 NOTE — Progress Notes (Signed)
Patient is currently being followed at the [facility] but isn't scheduled until April. Have made a closer follow-up appointment on January 28, 2016 at 11:30am. Thank you.

## 2016-01-19 NOTE — Discharge Instructions (Signed)
Heart Failure [facility] appointment on January 28, 2016 at 11:30am with Rick Gilmore, [place]. Please call [phone] to reschedule.  Heart Failure Heart failure is a condition in which the heart has trouble pumping blood. This means your heart does not pump blood efficiently for your body to work well. In some cases of heart failure, fluid may back up into your lungs or you may have swelling (edema) in your lower legs. Heart failure is usually a long-term (chronic) condition. It is important for you to take good care of yourself and follow your health care provider's treatment plan. CAUSES  Some health conditions can cause heart failure. Those health conditions include:  High blood pressure (hypertension). Hypertension causes the heart muscle to work harder than normal. When pressure in the blood vessels is high, the heart needs to pump (contract) with more force in order to circulate blood throughout the body. High blood pressure eventually causes the heart to become stiff and weak.  Coronary artery disease (CAD). CAD is the buildup of cholesterol and fat (plaque) in the arteries of the heart. The blockage in the arteries deprives the heart muscle of oxygen and blood. This can cause chest pain and may lead to a heart attack. High blood pressure can also contribute to CAD.  Heart attack (myocardial infarction). A heart attack occurs when one or more arteries in the heart become blocked. The loss of oxygen damages the muscle tissue of the heart. When this happens, part of the heart muscle dies. The injured tissue does not contract as well and weakens the heart's ability to pump blood.  Abnormal heart valves. When the heart valves do not open and close properly, it can cause heart failure. This makes the heart muscle pump harder to keep the blood flowing.  Heart muscle disease (cardiomyopathy or myocarditis). Heart muscle disease is damage to the heart muscle from a variety of causes. These can include drug  or alcohol abuse, infections, or unknown reasons. These can increase the risk of heart failure.  Lung disease. Lung disease makes the heart work harder because the lungs do not work properly. This can cause a strain on the heart, leading it to fail.  Diabetes. Diabetes increases the risk of heart failure. High blood sugar contributes to high fat (lipid) levels in the blood. Diabetes can also cause slow damage to tiny blood vessels that carry important nutrients to the heart muscle. When the heart does not get enough oxygen and food, it can cause the heart to become weak and stiff. This leads to a heart that does not contract efficiently.  Other conditions can contribute to heart failure. These include abnormal heart rhythms, thyroid problems, and low blood counts (anemia). Certain unhealthy behaviors can increase the risk of heart failure, including:  Being overweight.  Smoking or chewing tobacco.  Eating foods high in fat and cholesterol.  Abusing illicit drugs or alcohol.  Lacking physical activity. SYMPTOMS  Heart failure symptoms may vary and can be hard to detect. Symptoms may include:  Shortness of breath with activity, such as climbing stairs.  Persistent cough.  Swelling of the feet, ankles, legs, or abdomen.  Unexplained weight gain.  Difficulty breathing when lying flat (orthopnea).  Waking from sleep because of the need to sit up and get more air.  Rapid heartbeat.  Fatigue and loss of energy.  Feeling light-headed, dizzy, or close to fainting.  Loss of appetite.  Nausea.  Increased urination during the night (nocturia). DIAGNOSIS  A diagnosis of heart  failure is based on your history, symptoms, physical examination, and diagnostic tests. Diagnostic tests for heart failure may include:  Echocardiography.  Electrocardiography.  Chest X-ray.  Blood tests.  Exercise stress test.  Cardiac angiography.  Radionuclide scans. TREATMENT  Treatment is  aimed at managing the symptoms of heart failure. Medicines, behavioral changes, or surgical intervention may be necessary to treat heart failure.  Medicines to help treat heart failure may include:  Angiotensin-converting enzyme (ACE) inhibitors. This type of medicine blocks the effects of a blood protein called angiotensin-converting enzyme. ACE inhibitors relax (dilate) the blood vessels and help lower blood pressure.  Angiotensin receptor blockers (ARBs). This type of medicine blocks the actions of a blood protein called angiotensin. Angiotensin receptor blockers dilate the blood vessels and help lower blood pressure.  Water pills (diuretics). Diuretics cause the kidneys to remove salt and water from the blood. The extra fluid is removed through urination. This loss of extra fluid lowers the volume of blood the heart pumps.  Beta blockers. These prevent the heart from beating too fast and improve heart muscle strength.  Digitalis. This increases the force of the heartbeat.  Healthy behavior changes include:  Obtaining and maintaining a healthy weight.  Stopping smoking or chewing tobacco.  Eating heart-healthy foods.  Limiting or avoiding alcohol.  Stopping illicit drug use.  Physical activity as directed by your health care provider.  Surgical treatment for heart failure may include:  A procedure to open blocked arteries, repair damaged heart valves, or remove damaged heart muscle tissue.  A pacemaker to improve heart muscle function and control certain abnormal heart rhythms.  An internal cardioverter defibrillator to treat certain serious abnormal heart rhythms.  A left ventricular assist device (LVAD) to assist the pumping ability of the heart. HOME CARE INSTRUCTIONS   Take medicines only as directed by your health care provider. Medicines are important in reducing the workload of your heart, slowing the progression of heart failure, and improving your symptoms.  Do  not stop taking your medicine unless directed by your health care provider.  Do not skip any dose of medicine.  Refill your prescriptions before you run out of medicine. Your medicines are needed every day.  Engage in moderate physical activity if directed by your health care provider. Moderate physical activity can benefit some people. The elderly and people with severe heart failure should consult with a health care provider for physical activity recommendations.  Eat heart-healthy foods. Food choices should be free of trans fat and low in saturated fat, cholesterol, and salt (sodium). Healthy choices include fresh or frozen fruits and vegetables, fish, lean meats, legumes, fat-free or low-fat dairy products, and whole grain or high fiber foods. Talk to a dietitian to learn more about heart-healthy foods.  Limit sodium if directed by your health care provider. Sodium restriction may reduce symptoms of heart failure in some people. Talk to a dietitian to learn more about heart-healthy seasonings.  Use healthy cooking methods. Healthy cooking methods include roasting, grilling, broiling, baking, poaching, steaming, or stir-frying. Talk to a dietitian to learn more about healthy cooking methods.  Limit fluids if directed by your health care provider. Fluid restriction may reduce symptoms of heart failure in some people.  Weigh yourself every day. Daily weights are important in the early recognition of excess fluid. You should weigh yourself every morning after you urinate and before you eat breakfast. Wear the same amount of clothing each time you weigh yourself. Record your daily weight. Provide your  health care provider with your weight record.  Monitor and record your blood pressure if directed by your health care provider.  Check your pulse if directed by your health care provider.  Lose weight if directed by your health care provider. Weight loss may reduce symptoms of heart failure in some  people.  Stop smoking or chewing tobacco. Nicotine makes your heart work harder by causing your blood vessels to constrict. Do not use nicotine gum or patches before talking to your health care provider.  Keep all follow-up visits as directed by your health care provider. This is important.  Limit alcohol intake to no more than 1 drink per day for nonpregnant women and 2 drinks per day for men. One drink equals 12 ounces of beer, 5 ounces of wine, or 1 ounces of hard liquor. Drinking more than that is harmful to your heart. Tell your health care provider if you drink alcohol several times a week. Talk with your health care provider about whether alcohol is safe for you. If your heart has already been damaged by alcohol or you have severe heart failure, drinking alcohol should be stopped completely.  Stop illicit drug use.  Stay up-to-date with immunizations. It is especially important to prevent respiratory infections through current pneumococcal and influenza immunizations.  Manage other health conditions such as hypertension, diabetes, thyroid disease, or abnormal heart rhythms as directed by your health care provider.  Learn to manage stress.  Plan rest periods when fatigued.  Learn strategies to manage high temperatures. If the weather is extremely hot:  Avoid vigorous physical activity.  Use air conditioning or fans or seek a cooler location.  Avoid caffeine and alcohol.  Wear loose-fitting, lightweight, and light-colored clothing.  Learn strategies to manage cold temperatures. If the weather is extremely cold:  Avoid vigorous physical activity.  Layer clothes.  Wear mittens or gloves, a hat, and a scarf when going outside.  Avoid alcohol.  Obtain ongoing education and support as needed.  Participate in or seek rehabilitation as needed to maintain or improve independence and quality of life. SEEK MEDICAL CARE IF:   You have a rapid weight gain.  You have increasing  shortness of breath that is unusual for you.  You are unable to participate in your usual physical activities.  You tire easily.  You cough more than normal, especially with physical activity.  You have any or more swelling in areas such as your hands, feet, ankles, or abdomen.  You are unable to sleep because it is hard to breathe.  You feel like your heart is beating fast (palpitations).  You become dizzy or light-headed upon standing up. SEEK IMMEDIATE MEDICAL CARE IF:   You have difficulty breathing.  There is a change in mental status such as decreased alertness or difficulty with concentration.  You have a pain or discomfort in your chest.  You have an episode of fainting (syncope). MAKE SURE YOU:   Understand these instructions.  Will watch your condition.  Will get help right away if you are not doing well or get worse.   This information is not intended to replace advice given to you by your health care provider. Make sure you discuss any questions you have with your health care provider.   Document Released: 11/22/2005 Document Revised: 04/08/2015 Document Reviewed: 12/22/2012 Elsevier Interactive Patient Education 2016 [employer] Cardiac rehabilitation is a medically supervised program that helps improve the health and well-being of people with heart problems. Cardiac  rehabilitation includes exercise training, education, and counseling to help you get stronger and return to an active lifestyle. People who participate in cardiac rehabilitation programs get better faster and reduce future hospital stays. Cardiac rehabilitation programs can help when you have had the following conditions:  Heart attack.  Heart failure.  Peripheral artery disease.  Coronary artery disease.  Angina.  Lung or breathing problems. Cardiac rehabilitation programs are also used when you have the following procedures:  Coronary artery bypass graft  surgery.  Heart valve replacement.  Heart stent placement.  Heart transplant.  Aneurysm repair. CARDIAC REHABILITATION MAY HELP YOU:  Reduce problems like chest pain and trouble breathing.  Change risk factors that contribute to heart disease, such as:  Smoking.  High blood pressure.  High cholesterol.  Diabetes.  Being out of shape or not active.  Weighing more than 30% over your ideal weight.  Diet.  Improve your mental outlook so you feel:  Less depressed or "blue."  More hopeful.  Better about yourself.  More confident about taking care of yourself.  Get support from health experts as well as other people with similar problems.  Learn how to manage and understand your medicines.  Teach your family about your condition and how to participate in your recovery. WHAT HAPPENS IN CARDIAC REHABILITATION? You will be assessed by a cardiac rehabilitation team. They will check your health history and do a physical exam. You may need blood tests, stress tests, and other evaluations. You may not start a cardiac rehabilitation program if:  You develop angina with exercise or while at rest.  You have severe heart failure that limits your activity.  You have an abnormal heart rhythm at rest.  You develop heart rhythm problems during exercise.  You have high blood pressure that is not controlled. The cardiac rehabilitation team works with you to make a plan based on your health and goals. Everyone is unique, so each program is customized and your program may change as you progress. Members of a typical cardiac rehabilitation team may include such health professionals as:  Doctors.  Nurses.  Dietitians.  Psychologists.  Exercise specialists.  Physical and occupational therapists. A typical cardiac rehabilitation program is divided into phases. You advance from one phase to the next. Most cardiac rehabilitation sessions last for 60 minutes, 3 times a week.    Phase One starts while you are still in the hospital. You may start by walking in your room and then in the hall. You may start some simple exercises with a therapist. Health care team members will give you information and ask you many questions. You may not be able to remember details, so have a family member or an advocate with you to help keep track of information.  Phase Two begins when you go home or to another facility. This phase may last 8 to 12 weeks. You will travel to a cardiac rehabilitation center or a place where it is offered. Typically, you gradually increase your activity while being closely watched by a nurse or therapist. Exercises may be a combination of strength or resistance training and "cardio" or aerobic movement on a treadmill or other machines. Your condition will determine how often and how long these sessions will last.  In phase two, you may learn how to cook healthy meals, control your blood sugar, and manage your medicines. You may need help with scheduling or planning how and when to take your medicines. Use a timer, divided pill box, or follow  a form to make taking your medicines easier. Use the method that works best for you. Some medicines should not be taken with certain foods. If you take more than one blood pressure medicine, you may need to stagger the times you take them. Taking all your blood pressure medicine at the same time may lower your blood pressure too much. If you have questions about your medicines, ask your health care provider questions until you understand.  Phase Three continues for the rest of your life. There will be less supervision. You may still participate in cardiac rehabilitation activities or become part of a group in your community. You may benefit from talking to other people about your experience if they are facing similar challenges. How soon you drive, have sex, or return to work will depend on your condition. These decisions should be made  by you and your health care provider. If you need help, ask for it. Find out where you can get the help you need. Ask questions until you get answers and understand. SEEK IMMEDIATE MEDICAL CARE IF:  Get medical help at once if you experience any of the following symptoms:  Severe chest discomfort, especially if the pain is crushing or pressure-like and spreads to the arms, back, neck, or jaw. Do not wait to see if the pain will go away.  Weakness or numbness in your face, arms, or legs, especially on one side of the body; slurred speech; confusion; sudden severe headache or loss of vision (all symptoms of stroke).  You have shortness of breath.  You are sweating and feel sick to your stomach (nausea).  You feel dizzy or faint.  You experience profound tiredness (fatigue). Call your local emergency service (911 in the [place] Do not drive yourself to the hospital.   This information is not intended to replace advice given to you by your health care provider. Make sure you discuss any questions you have with your health care provider.   Document Released: 08/31/2008 Document Revised: 12/13/2014 Document Reviewed: 02/26/2011 Elsevier Interactive Patient Education [employer].

## 2016-01-19 NOTE — Discharge Summary (Signed)
[facility] [place] NAME: Rick [facility]    MR#:  [id number]  DATE OF BIRTH:  [birth year]  DATE OF ADMISSION:  01/16/2016 ADMITTING PHYSICIAN: Theodoro Grist, MD  DATE OF DISCHARGE:01/19/2016  PRIMARY CARE PHYSICIAN: Crecencio Mc, MD    ADMISSION DIAGNOSIS:  Acute on chronic congestive heart failure, unspecified congestive heart failure type (Rick [facility]) [I50.9]  DISCHARGE DIAGNOSIS:  Principal Problem:   Acute on chronic systolic CHF (congestive heart failure) (HCC) Active Problems:   Elevated troponin   Respiratory distress, acute (HCC)   Hypoxia   Chronic renal insufficiency   Hyperglycemia   SECONDARY DIAGNOSIS:   Past Medical History  Diagnosis Date  . CAD (coronary artery disease)   . Atrial fibrillation (Rick [facility])     on Coumadin  . Hypertension   . Erectile dysfunction   . Hyperlipidemia   . Ischemic cardiomyopathy     s/p AICD/pacer 2009, replaced 2010 [facility]  . Cancer Rick [facility]) 2001    Prostate, XRT and implant  . Tubular adenoma     colon polyp  . Personal history of prostate cancer 2001    s/p XRT and implant (Rick [facility])  . Sleep apnea, obstructive     uses CPAP nightly  . Anemia   . Thrombocytopenia (Rick [facility])   . Prostate cancer (Rick [facility])   . CHF (congestive heart failure) (Rick [facility]more)   . MI (myocardial infarction) (Rick Gilmore) 1988  . Arrhythmia     a fib  . COPD (chronic obstructive pulmonary disease) (Rick Gilmore)   . Vitamin B 12 deficiency   . AICD (automatic cardioverter/defibrillator) present   . GERD (gastroesophageal reflux disease)     [facility] COURSE:   75y/o M with PMH of CAD, afib on eliquis, CHF with EF 35%, I pretension, GERD, COPD, anemia and thrombocytopenia presents to the [facility] from PCP office secondary to worsening shortness of breath.  #1 acute on chronic systolic CHF exacerbation-patient has been non-compliant with his diet with his recent beach trip and fluid restriction at home.  - change lasix to home dose of  torsemide - feels back to his baseline today.  #2 COPD exacerbation- cont steroids and nebs- improving - wean o2 as tolerated- not on home oxygen- now on room air. We'll start DuoNeb's and also steroids for now. -Chest x-ray on admission showing pulmonary vascular congestion. Repeat CXR with improvement - discharge on inhalers  #2 hypoxic respiratory failure-secondary to CHF exacerbation. -Not on any home oxygen, on room air now.  #3 hypertension-hold all his blood pressure medications this morning including lisinopril, Imdur, Norvasc and metoprolol due to hypotension.  Advised to stop some of them on d/c.  #4 chronic atrial fibrillation-rate is controlled. Metoprolol on hold. -Continue eliquis  #5 anemia and thrombocytopenia-continue to monitor while on anticoagulation. Platelets have dropped from 80,000-74,000.  DISCHARGE CONDITIONS:   Stable.  CONSULTS OBTAINED:  Treatment Team:  Teodoro Spray, MD Corey Skains, MD  DRUG ALLERGIES:  No Known Allergies  DISCHARGE MEDICATIONS:   Current Discharge Medication List    CONTINUE these medications which have NOT CHANGED   Details  allopurinol (ZYLOPRIM) 100 MG tablet Take 1 tablet (100 mg total) by mouth daily. Qty: 90 tablet, Refills: 3    apixaban (ELIQUIS) 5 MG TABS tablet Take 5 mg by mouth 2 (two) times daily.    colchicine 0.6 MG tablet Take 0.6 mg by mouth daily as needed (for gout flares).     cyanocobalamin (,VITAMIN B-12,) 1000 MCG/ML injection  Inject 1,000 mcg into the muscle every 30 (thirty) days.    doxazosin (CARDURA) 4 MG tablet Take 4 mg by mouth at bedtime.    escitalopram (LEXAPRO) 10 MG tablet Take 10 mg by mouth daily.    esomeprazole (NEXIUM) 40 MG capsule Take 40 mg by mouth daily before breakfast.    isosorbide mononitrate (IMDUR) 30 MG 24 hr tablet Take 30 mg by mouth daily. Refills: 11    lisinopril (PRINIVIL,ZESTRIL) 5 MG tablet Take 5 mg by mouth daily.     metoprolol succinate  (TOPROL-XL) 50 MG 24 hr tablet Take 100 mg by mouth daily. Take with or immediately following a meal.    potassium chloride SA (K-DUR,KLOR-CON) 20 MEQ tablet Take 20 mEq by mouth at bedtime.    torsemide (DEMADEX) 20 MG tablet Take 40 mg by mouth 2 (two) times daily.         DISCHARGE INSTRUCTIONS:    Follow with Cardiology clinic in 2 weeks. Fluid and salt restriction.   If you experience worsening of your admission symptoms, develop shortness of breath, life threatening emergency, suicidal or homicidal thoughts you must seek medical attention immediately by calling 911 or calling your MD immediately  if symptoms less severe.  You Must read complete instructions/literature along with all the possible adverse reactions/side effects for all the Medicines you take and that have been prescribed to you. Take any new Medicines after you have completely understood and accept all the possible adverse reactions/side effects.   Please note  You were cared for by a hospitalist during your [facility] stay. If you have any questions about your discharge medications or the care you received while you were in the [facility] after you are discharged, you can call the unit and asked to speak with the hospitalist on call if the hospitalist that took care of you is not available. Once you are discharged, your primary care physician will handle any further medical issues. Please note that NO REFILLS for any discharge medications will be authorized once you are discharged, as it is imperative that you return to your primary care physician (or establish a relationship with a primary care physician if you do not have one) for your aftercare needs so that they can reassess your need for medications and monitor your lab values.    Today   CHIEF COMPLAINT:   Chief Complaint  Patient presents with  . Shortness of Breath    HISTORY OF PRESENT ILLNESS:  Rick Gilmore  is a 75 y.o. male with a known history of  coronary artery disease, chronic atrial fibrillation, ischemic cardiomyopathy with ejection fraction of around 35%, according to December 2015 echocardiogram, status post AICD placement in 2009 replacement 2010, COPD, hyperlipidemia, iron deficiency anemia, tubular adenoma, hyperlipidemia who presents to the [facility] with complaints of shortness of breath, and the patient, he started feeling short of breath yesterday and especially this morning. He was seen by his primary care physician, Dr. Derrel Nip, who diagnosed him with CHF exacerbation and recommended to come to emergency room for further evaluation. In emergency room, he was found to be borderline hypoxic with O2 sats of 89% on room air and was initiated on 2 L of oxygen through nasal cannula, he was given Lasix intravenously and hospitalist services were contacted for admission. Chest x-ray revealed cardiomegaly and mild pulmonary vascular congestion and mild elevation of troponin. Patient denied any chest pains.    VITAL SIGNS:  Blood pressure 107/45, pulse 70, temperature 98.5 F (36.9  C), temperature source Oral, resp. rate 18, height 6\' 3"  (1.905 m), weight 119.976 kg (264 lb 8 oz), SpO2 95 %.  I/O:   Intake/Output Summary (Last 24 hours) at 01/19/16 1317 Last data filed at 01/19/16 1212  Gross per 24 hour  Intake    720 ml  Output   2125 ml  Net  -1405 ml    PHYSICAL EXAMINATION:   GENERAL: 75 y.o.-year-old patient lying in the bed with no acute distress.  EYES: Pupils equal, round, reactive to light and accommodation. No scleral icterus. Extraocular muscles intact.  HEENT: Head atraumatic, normocephalic. Oropharynx and nasopharynx clear.  NECK: Supple, no jugular venous distention. No thyroid enlargement, no tenderness.  LUNGS: scattered expiratory wheezing, No rales,rhonchi or crepitation. No use of accessory muscles of respiration.  CARDIOVASCULAR: S1, S2 normal. No murmurs, rubs, or gallops.  ABDOMEN: Soft, nontender,  nondistended. Bowel sounds present. No organomegaly or mass.  EXTREMITIES: No pedal edema, cyanosis, or clubbing.  NEUROLOGIC: Cranial nerves II through XII are intact. Muscle strength 5/5 in all extremities. Sensation intact. Gait not checked.  PSYCHIATRIC: The patient is alert and oriented x 3.  SKIN: No obvious rash, lesion, or ulcer.   DATA REVIEW:   CBC  Recent Labs Lab 01/19/16 0417  WBC 3.9  HGB 10.6*  HCT 31.5*  PLT 83*    Chemistries   Recent Labs Lab 01/19/16 0417  NA 137  K 3.9  CL 105  CO2 28  GLUCOSE 110*  BUN 33*  CREATININE 1.12  CALCIUM 8.6*    Cardiac Enzymes  Recent Labs Lab 01/17/16 0419  TROPONINI 0.06*    Microbiology Results  Results for orders placed or performed in visit on 01/21/15  Fecal occult blood, imunochemical     Status: Abnormal   Collection Time: 01/21/15 12:28 PM  Result Value Ref Range Status   Fecal Occult Bld Positive (A) Negative Final    Comment: called to carolyn   Results faxed to site/floor on 01/21/2015 12:53 PM by Delorise Jackson.    RADIOLOGY:  Dg Chest 2 View  01/18/2016  CLINICAL DATA:  75 year old male with cough and shortness of breath. EXAM: CHEST  2 VIEW COMPARISON:  01/16/2016 and prior exams FINDINGS: Cardiomegaly, CABG changes and left-sided ICD/pacemaker noted. Pulmonary vascular congestion has decreased. Very small bilateral pleural effusions are noted. There is no evidence of airspace disease or pneumothorax there IMPRESSION: Cardiomegaly with decreased pulmonary vascular congestion. Very small bilateral pleural effusions. Electronically Signed   By: Margarette Canada M.D.   On: 01/18/2016 10:02     Management plans discussed with the patient, family and they are in agreement.  CODE STATUS:     Code Status Orders        Start     Ordered   01/16/16 1545  Full code   Continuous     01/16/16 1544    Code Status History    Date Active Date Inactive Code Status Order ID Comments User Context    This patient has a current code status but no historical code status.    Advance Directive Documentation        Most Recent Value   Type of Advance Directive  Healthcare Power of Attorney   Pre-existing out of facility DNR order (yellow form or pink MOST form)     "MOST" Form in Place?        TOTAL TIME TAKING CARE OF THIS PATIENT: 35 minutes.    Vaughan Basta M.D on 01/19/2016  at 1:17 PM  Between 7am to 6pm - Pager - [phone]  After 6pm go to [email or web address] - password [email or web address] [facility]  Office  [phone]  CC: Primary care physician; Crecencio Mc, MD   Note: This dictation was prepared with Dragon dictation along with smaller phrase technology. Any transcriptional errors that result from this process are unintentional.

## 2016-01-27 DIAGNOSIS — K219 Gastro-esophageal reflux disease without esophagitis: Secondary | ICD-10-CM | POA: Diagnosis not present

## 2016-01-27 DIAGNOSIS — I4430 Unspecified atrioventricular block: Secondary | ICD-10-CM | POA: Diagnosis not present

## 2016-01-27 DIAGNOSIS — R600 Localized edema: Secondary | ICD-10-CM | POA: Diagnosis not present

## 2016-01-27 DIAGNOSIS — E782 Mixed hyperlipidemia: Secondary | ICD-10-CM | POA: Diagnosis not present

## 2016-01-27 DIAGNOSIS — Z951 Presence of aortocoronary bypass graft: Secondary | ICD-10-CM | POA: Diagnosis not present

## 2016-01-27 DIAGNOSIS — I255 Ischemic cardiomyopathy: Secondary | ICD-10-CM | POA: Diagnosis not present

## 2016-01-27 DIAGNOSIS — Z5181 Encounter for therapeutic drug level monitoring: Secondary | ICD-10-CM | POA: Diagnosis not present

## 2016-01-27 DIAGNOSIS — I5022 Chronic systolic (congestive) heart failure: Secondary | ICD-10-CM | POA: Diagnosis not present

## 2016-01-27 DIAGNOSIS — I10 Essential (primary) hypertension: Secondary | ICD-10-CM | POA: Diagnosis not present

## 2016-01-27 DIAGNOSIS — I480 Paroxysmal atrial fibrillation: Secondary | ICD-10-CM | POA: Diagnosis not present

## 2016-01-27 DIAGNOSIS — I25810 Atherosclerosis of coronary artery bypass graft(s) without angina pectoris: Secondary | ICD-10-CM | POA: Diagnosis not present

## 2016-01-27 DIAGNOSIS — Z95810 Presence of automatic (implantable) cardiac defibrillator: Secondary | ICD-10-CM | POA: Diagnosis not present

## 2016-01-27 DIAGNOSIS — I482 Chronic atrial fibrillation: Secondary | ICD-10-CM | POA: Diagnosis not present

## 2016-01-28 ENCOUNTER — Ambulatory Visit: Payer: Medicare Other | Attending: Family | Admitting: Family

## 2016-01-28 ENCOUNTER — Encounter: Payer: Self-pay | Admitting: Family

## 2016-01-28 VITALS — BP 116/97 | HR 79 | Resp 20 | Ht 75.0 in | Wt 257.0 lb

## 2016-01-28 DIAGNOSIS — Z951 Presence of aortocoronary bypass graft: Secondary | ICD-10-CM | POA: Diagnosis not present

## 2016-01-28 DIAGNOSIS — I5022 Chronic systolic (congestive) heart failure: Secondary | ICD-10-CM

## 2016-01-28 DIAGNOSIS — Z7901 Long term (current) use of anticoagulants: Secondary | ICD-10-CM | POA: Insufficient documentation

## 2016-01-28 DIAGNOSIS — K219 Gastro-esophageal reflux disease without esophagitis: Secondary | ICD-10-CM | POA: Insufficient documentation

## 2016-01-28 DIAGNOSIS — Z95810 Presence of automatic (implantable) cardiac defibrillator: Secondary | ICD-10-CM | POA: Insufficient documentation

## 2016-01-28 DIAGNOSIS — Z79899 Other long term (current) drug therapy: Secondary | ICD-10-CM | POA: Diagnosis not present

## 2016-01-28 DIAGNOSIS — J449 Chronic obstructive pulmonary disease, unspecified: Secondary | ICD-10-CM | POA: Diagnosis not present

## 2016-01-28 DIAGNOSIS — I4891 Unspecified atrial fibrillation: Secondary | ICD-10-CM | POA: Diagnosis not present

## 2016-01-28 DIAGNOSIS — G4733 Obstructive sleep apnea (adult) (pediatric): Secondary | ICD-10-CM | POA: Insufficient documentation

## 2016-01-28 DIAGNOSIS — D696 Thrombocytopenia, unspecified: Secondary | ICD-10-CM | POA: Insufficient documentation

## 2016-01-28 DIAGNOSIS — Z9889 Other specified postprocedural states: Secondary | ICD-10-CM | POA: Insufficient documentation

## 2016-01-28 DIAGNOSIS — I5032 Chronic diastolic (congestive) heart failure: Secondary | ICD-10-CM | POA: Diagnosis not present

## 2016-01-28 DIAGNOSIS — I252 Old myocardial infarction: Secondary | ICD-10-CM | POA: Diagnosis not present

## 2016-01-28 DIAGNOSIS — Z9852 Vasectomy status: Secondary | ICD-10-CM | POA: Diagnosis not present

## 2016-01-28 DIAGNOSIS — I255 Ischemic cardiomyopathy: Secondary | ICD-10-CM | POA: Diagnosis not present

## 2016-01-28 DIAGNOSIS — I110 Hypertensive heart disease with heart failure: Secondary | ICD-10-CM | POA: Insufficient documentation

## 2016-01-28 DIAGNOSIS — Z8546 Personal history of malignant neoplasm of prostate: Secondary | ICD-10-CM | POA: Insufficient documentation

## 2016-01-28 DIAGNOSIS — E785 Hyperlipidemia, unspecified: Secondary | ICD-10-CM | POA: Diagnosis not present

## 2016-01-28 DIAGNOSIS — I2510 Atherosclerotic heart disease of native coronary artery without angina pectoris: Secondary | ICD-10-CM | POA: Diagnosis not present

## 2016-01-28 DIAGNOSIS — I482 Chronic atrial fibrillation, unspecified: Secondary | ICD-10-CM

## 2016-01-28 MED ORDER — TORSEMIDE 20 MG PO TABS: 40.0000 mg | ORAL_TABLET | Freq: Two times a day (BID) | ORAL | Status: DC

## 2016-01-28 NOTE — Progress Notes (Signed)
Subjective:    Patient ID: Rick [facility]more, male    DOB: 23-Jun-1941, 75 y.o.   MRN: [id number]  Congestive Heart Failure Presents for follow-up visit. Associated symptoms include edema, fatigue and shortness of breath (minimal). Pertinent negatives include no abdominal pain, chest pain, chest pressure, orthopnea, palpitations or paroxysmal nocturnal dyspnea. The symptoms have been improving. Past treatments include beta blockers, salt and fluid restriction and ACE inhibitors. The treatment provided moderate relief. Compliance with prior treatments has been good. His past medical history is significant for anemia, arrhythmia, CAD, chronic lung disease and HTN. He has multiple 2nd degree relatives with heart disease. Compliance with total regimen is 76-100%.  Atrial Fibrillation Presents for follow-up visit. Symptoms include dizziness (when changing positions too quickly) and shortness of breath (minimal). Symptoms are negative for an AICD problem, bradycardia, chest pain, palpitations and tachycardia. The symptoms have been stable. Past treatments include beta blockers and anticoagulant. Compliance with prior treatments has been good. Past medical history includes atrial fibrillation, CAD, CHF and HTN. There are no medication compliance problems.   Past Medical History  Diagnosis Date  . CAD (coronary artery disease)   . Atrial fibrillation ([place])     on Coumadin  . Hypertension   . Erectile dysfunction   . Hyperlipidemia   . Ischemic cardiomyopathy     s/p AICD/pacer 2009, replaced 2010 [facility]  . Cancer [facility]) 2001    Prostate, XRT and implant  . Tubular adenoma     colon polyp  . Personal history of prostate cancer 2001    s/p XRT and implant ([place])  . Sleep apnea, obstructive     uses CPAP nightly  . Anemia   . Thrombocytopenia ([place])   . Prostate cancer ([place])   . CHF (congestive heart failure) ([place])   . MI (myocardial infarction) ([place]) 1988  . Arrhythmia     a fib  . COPD (chronic  obstructive pulmonary disease) ([place])   . Vitamin B 12 deficiency   . AICD (automatic cardioverter/defibrillator) present   . GERD (gastroesophageal reflux disease)     Past Surgical History  Procedure Laterality Date  . Vasectomy    . Coronary artery bypass graft  1988  . Implantable cardioverter defibrillator generator change  April 2014    Bi V RRR  . Insert / replace / remove pacemaker  2014  . Cardiac catheterization    . Colonoscopy with propofol [name] 05/30/2015    Procedure: COLONOSCOPY WITH PROPOFOL;  Surgeon: Manya Silvas, MD;  Location: [facility] ENDOSCOPY;  Service: Endoscopy;  Laterality: [name];  . Esophagogastroduodenoscopy [name] 05/30/2015    Procedure: ESOPHAGOGASTRODUODENOSCOPY (EGD);  Surgeon: Manya Silvas, MD;  Location: [facility] ENDOSCOPY;  Service: Endoscopy;  Laterality: [name];    Family History  Problem Relation Age of Onset  . Hypertension Mother   . Hypertension Father   . Cancer Father     prostate, throat  . Emphysema Father   . Heart disease Maternal Grandmother   . Heart disease Paternal Grandmother     Social History  Substance Use Topics  . Smoking status: Never Smoker   . Smokeless tobacco: Never Used  . Alcohol Use: 1.0 oz/week    2 Standard drinks or equivalent per week    No Known Allergies  Prior to Admission medications   Medication Sig Start Date End Date Taking? Authorizing Provider  allopurinol (ZYLOPRIM) 100 MG tablet Take 1 tablet (100 mg total) by mouth daily. 07/21/15  Yes Wallene Huh, DPM  apixaban (ELIQUIS) 5 MG TABS tablet Take 5 mg by mouth 2 (two) times daily.   Yes Historical Provider, MD  colchicine 0.6 MG tablet Take 0.6 mg by mouth daily as needed (for gout flares).    Yes Historical Provider, MD  cyanocobalamin (,VITAMIN B-12,) 1000 MCG/ML injection Inject 1,000 mcg into the muscle every 30 (thirty) days.   Yes Historical Provider, MD  escitalopram (LEXAPRO) 10 MG tablet Take 10 mg by mouth daily.   Yes Historical Provider, MD   esomeprazole (NEXIUM) 40 MG capsule Take 40 mg by mouth daily before breakfast.   Yes Historical Provider, MD  isosorbide mononitrate (IMDUR) 30 MG 24 hr tablet Take 30 mg by mouth daily.   Yes Historical Provider, MD  lisinopril (PRINIVIL,ZESTRIL) 5 MG tablet Take 5 mg by mouth daily.   Yes Historical Provider, MD  metoprolol succinate (TOPROL-XL) 100 MG 24 hr tablet Take 100 mg by mouth daily. Take with or immediately following a meal.   Yes Historical Provider, MD  potassium chloride SA (K-DUR,KLOR-CON) 20 MEQ tablet Take 20 mEq by mouth at bedtime.   Yes Historical Provider, MD  torsemide (DEMADEX) 20 MG tablet Take 2 tablets (40 mg total) by mouth 2 (two) times daily. 01/28/16  Yes Alisa Graff, FNP     Review of Systems  Constitutional: Positive for fatigue. Negative for appetite change.  HENT: Positive for rhinorrhea. Negative for congestion, postnasal drip and sore throat.   Eyes: Negative.   Respiratory: Positive for cough (dry cough) and shortness of breath (minimal). Negative for chest tightness and wheezing.   Cardiovascular: Positive for leg swelling (minimal). Negative for chest pain and palpitations.  Gastrointestinal: Negative for abdominal pain and abdominal distention.  Endocrine: Negative.   Genitourinary: Negative.   Musculoskeletal: Negative for myalgias and back pain.  Skin: Negative.   Allergic/Immunologic: Negative.   Neurological: Positive for dizziness (when changing positions too quickly). Negative for light-headedness and headaches.  Hematological: Negative for adenopathy. Bruises/bleeds easily.  Psychiatric/Behavioral: Negative for sleep disturbance and dysphoric mood. The patient is not nervous/anxious.        Objective:   Physical Exam  Constitutional: He is oriented to person, place, and time. He appears well-developed and well-nourished.  HENT:  Head: Normocephalic and atraumatic.  Eyes: Conjunctivae are normal. Pupils are equal, round, and reactive  to light.  Neck: Normal range of motion. Neck supple.  Cardiovascular: Normal rate.  An irregular rhythm present.  No murmur heard. Pulmonary/Chest: Effort normal. He has no wheezes. He has no rales.  Abdominal: Soft. He exhibits no distension. There is no tenderness.  Musculoskeletal: He exhibits edema (trace pitting edema in bilateral lower legs). He exhibits no tenderness.  Neurological: He is alert and oriented to person, place, and time.  Skin: Skin is warm and dry.  Psychiatric: He has a normal mood and affect. His behavior is normal. Thought content normal.  Nursing note and vitals reviewed.  BP 116/97 mmHg  Pulse 79  Resp 20  Ht 6\' 3"  (1.905 m)  Wt 257 lb (116.574 kg)  BMI 32.12 kg/m2  SpO2 96%        Assessment & Plan:  1: Chronic heart failure with preserved ejection fraction- Patient presents after a recent hospitalization with some fatigue and shortness of breath upon exertion. When he does experience symptoms, he will stop what he's doing to rest until his symptoms improve. Reports minimal swelling in his lower legs. Continues to weigh himself and reports a stable weight. By our  scale, he's lost 9 pounds since 01/08/16. Reminded to call for an overnight weight gain of >2 pounds or a weekly weight gain of >5 pounds. He is not adding any salt to his food and is trying to closely follow a low sodium diet. Discussed changing his lisinopril to entresto but he'd like to take up some more of his lisinopril as he's gets his medications through mail order.  2: Atrial fibrillation- Currently rate controlled at this time. Follows with cardiology and has an appointment with his cardiologist on 04/22/16. 3: Obstructive sleep apnea- Does wear his CPAP on a nightly basis and feels like he's sleeping well and waking up feeling rested. Sees his PCP on 04/19/16.  Return here in 1 month or sooner for any questions/problems before then.

## 2016-01-28 NOTE — Patient Instructions (Signed)
Continue weighing daily and call for an overnight weight gain of > 2 pounds or a weekly weight gain of >5 pounds. 

## 2016-01-29 ENCOUNTER — Ambulatory Visit (INDEPENDENT_AMBULATORY_CARE_PROVIDER_SITE_OTHER): Payer: Medicare Other | Admitting: *Deleted

## 2016-01-29 DIAGNOSIS — J439 Emphysema, unspecified: Secondary | ICD-10-CM

## 2016-01-29 DIAGNOSIS — J90 Pleural effusion, not elsewhere classified: Secondary | ICD-10-CM | POA: Diagnosis not present

## 2016-01-29 LAB — PULMONARY FUNCTION TEST
DL/VA % pred: 59 %
DL/VA: 2.88 ml/min/mmHg/L
DLCO unc % pred: 51 %
DLCO unc: 19.54 ml/min/mmHg
FEF 25-75 Post: 3.03 L/sec
FEF 25-75 Pre: 1.66 L/sec
FEF2575-%Change-Post: 82 %
FEF2575-%Pred-Post: 114 %
FEF2575-%Pred-Pre: 62 %
FEV1-%Change-Post: 26 %
FEV1-%Pred-Post: 80 %
FEV1-%Pred-Pre: 63 %
FEV1-Post: 2.89 L
FEV1-Pre: 2.29 L
FEV1FVC-%Change-Post: 8 %
FEV1FVC-%Pred-Pre: 92 %
FEV6-%Change-Post: 18 %
FEV6-%Pred-Post: 83 %
FEV6-%Pred-Pre: 70 %
FEV6-Post: 3.89 L
FEV6-Pre: 3.29 L
FEV6FVC-%Change-Post: 0 %
FEV6FVC-%Pred-Post: 104 %
FEV6FVC-%Pred-Pre: 104 %
FVC-%Change-Post: 15 %
FVC-%Pred-Post: 79 %
FVC-%Pred-Pre: 68 %
FVC-Post: 3.92 L
FVC-Pre: 3.38 L
Post FEV1/FVC ratio: 74 %
Post FEV6/FVC ratio: 99 %
Pre FEV1/FVC ratio: 68 %
Pre FEV6/FVC Ratio: 99 %

## 2016-01-29 NOTE — Progress Notes (Signed)
SMW performed today. 

## 2016-01-29 NOTE — Progress Notes (Signed)
PFT performed today with Nitrogen washout. 

## 2016-01-30 ENCOUNTER — Other Ambulatory Visit: Payer: Self-pay | Admitting: Internal Medicine

## 2016-02-02 ENCOUNTER — Encounter: Payer: Self-pay | Admitting: Internal Medicine

## 2016-02-02 ENCOUNTER — Ambulatory Visit (INDEPENDENT_AMBULATORY_CARE_PROVIDER_SITE_OTHER): Payer: Medicare Other | Admitting: Internal Medicine

## 2016-02-02 VITALS — BP 130/68 | HR 94 | Ht 75.0 in | Wt 259.4 lb

## 2016-02-02 DIAGNOSIS — J302 Other seasonal allergic rhinitis: Secondary | ICD-10-CM | POA: Diagnosis not present

## 2016-02-02 DIAGNOSIS — J309 Allergic rhinitis, unspecified: Secondary | ICD-10-CM | POA: Insufficient documentation

## 2016-02-02 DIAGNOSIS — J90 Pleural effusion, not elsewhere classified: Secondary | ICD-10-CM | POA: Diagnosis not present

## 2016-02-02 DIAGNOSIS — J439 Emphysema, unspecified: Secondary | ICD-10-CM | POA: Diagnosis not present

## 2016-02-02 MED ORDER — FLUTICASONE PROPIONATE 50 MCG/ACT NA SUSP: 1.0000 | Freq: Every day | NASAL | Status: DC

## 2016-02-02 MED ORDER — UMECLIDINIUM-VILANTEROL 62.5-25 MCG/INH IN AEPB: 1.0000 | INHALATION_SPRAY | Freq: Every day | RESPIRATORY_TRACT | Status: AC

## 2016-02-02 MED ORDER — MONTELUKAST SODIUM 10 MG PO TABS: 10.0000 mg | ORAL_TABLET | Freq: Every day | ORAL | Status: DC

## 2016-02-02 MED ORDER — UMECLIDINIUM-VILANTEROL 62.5-25 MCG/INH IN AEPB: 1.0000 | INHALATION_SPRAY | Freq: Every day | RESPIRATORY_TRACT | Status: DC

## 2016-02-02 NOTE — Assessment & Plan Note (Signed)
Multifactorial - CHF, OSA, Cardiomyopathy, poor EF, Afib, elevated RSVP, possible PAH or pHTN   Since his last visit he has had moderate improvement in his dyspnea, he does not feel the need to pursue a RCA, which I am in agreement with today, based on his clinical status.   At this time the majority of his bilateral fusions related to his CHF. Review of his x-ray today showed minimal bilateral pleural effusions. Again, diuresis along with monitoring of input/output, and salt intake will assist in improvement of his respiratory status.  Plan - supportive care - cont with CHF regiment which is diuretics and BP management (see med list).  - diet, exercise

## 2016-02-02 NOTE — Assessment & Plan Note (Signed)
Patient having allergic rhinitis given the rapid weather changes. Plan: - Singuliar 10 mg, 1 tab daily during allergy season. Disp 90, Refill -3 - flonase 60mcg - 1 spray to each nostril daily - until allergy season is over.

## 2016-02-02 NOTE — Assessment & Plan Note (Signed)
Patient is a never smoker, had significant secondhand smoking exposure from his parents for about 18 years. He has moderate obstruction on his recent PFTs. Dyspnea = 5, during 6 minute walk test.  Given his symptoms, shortness of breath with exertion, mild cough, nasal congestion, along with his very function testing findings, we'll start him on a bronchodilator. Today we discussed initiation of Anoro, he is in agreement with. Use of this medication, proper administration, proper technique, and education discussed with the patient today   Plan: -Anoro, 62.70mcg, 1 puff daily, -gargle and rinse after each use.  -Avoid allergens -Rescue inhaler, albuterol.

## 2016-02-02 NOTE — Progress Notes (Signed)
MRN# [id number] Rick Gilmore Dec 07, 1940   CC: Chief Complaint  Patient presents with  . Follow-up    pt. states breathing is baseline. occ. SOB. dry cough X2-3wk. denies wheezing or chest pain/tightness. wears CPAP 8hr. everynight. pressure is good. no supplies needed. [id number].       Brief History: 10/29/14 Patient is a pleasant 75 year old male referred by Dr. Derrel Nip for second opinion about bilateral pleural effusion and shortness of breath. Brief history - history of ischemic cardiomyopathy, CHF NYFC III, OSA, BiV ICD, atrial fibrillation, Iron Def Anemia, Mild thrombocytopenia, prostate cancer (s\p seed implants and XRT) and obesity. He was referred by his cardiologist to Dr. Raul Del [facility]) for evaluation of dyspnea. Bilateral pleural effusions were found on chestCT, moderate sized, R > L, with R lung atelectasis. No thoracentesis was ordered or planned by Dr. Raul Del. He is now wearing supplemental oxygen at night (2L). The initiation of Imdur at night has made a considerable improvement in his dyspnea,EF is still 35% by repeat ECHO,taking Eliquis for mitigation of CVA risk. Patient has no history of chest pain, pleurisy, calf pain or cough, no hemoptysis, no syncope, he doesn't a history of initial fibrillation with a decreased ejection fraction on anticoagulation. He also has a history of ulcerative sleep apnea, on CPAP, and is compliant with his CPAP machine. Review of records showed he saw Dr. Raul Del on 09/24/2014 for shortness of breath, Dr. Raul Del was working him up for possible pulmonary hypertension due to severely elevated RVSP (70.2) on an echo in July 2015, workup would include weight loss, continue CPAP, overnight oximetry, trial of anoro ellipta/albuterol, pulmonary rehabilitation, which strict salt and fluids, possible evaluation for right heart cardiac catheterization and follow-up in February 2016.  Patient was not satisfied with the above plan  outlined by Dr. Raul Del, according to patient it was not clearly stated, and even though his shortness of breath was moderately improving, he did not understand why he was having bilateral pleural effusions. Currently he can walk about 30 yards, still has 1-2+ pitting edema, but wears compression stockings. Patient also follows with cardiology closely, [facility] (Dr. Nehemiah Massed). He denies any fevers, chills, night sweats, rapid weight loss. Plan - poor cardiac function, no thoracentesis, continue follow up with Dr. Raul Del.    Events since last clinic visit: Patient here for follow up visit for chronic bilateral pleural effusions. He also had a 6 minute walk test on a pulmonary function test that he would like to discuss the results of today. History for the patient states that he was out birdwatching earlier on this month, [place]ness of breath and possible flulike illness, he was admitted to the hospital for CHF exacerbation, where he was treated with only diuresis for about 4 days. He stated [place] he did eat unhealthy, had a high salt intake and high seafood intake. Off note, one of his friends that he wouldn't permit watching with developed the flu and is currently in the ICU on a mechanical ventilator. Patient states since his hospitalization, his breathing has improved, and his pleural effusions have reduced in size per the current chest x-ray. Today he is back to baseline breathing, he does endorse a mild nonproductive cough, and mild sinus congestion, which she attributes to the rapid weather changes   Medication:   Current Outpatient Rx  Name  Route  Sig  Dispense  Refill  . allopurinol (ZYLOPRIM) 100  MG tablet   Oral   Take 1 tablet (100 mg total) by mouth daily.   90 tablet   3   . apixaban (ELIQUIS) 5 MG TABS tablet   Oral   Take 5 mg by mouth 2 (two) times daily.         . colchicine 0.6 MG  tablet   Oral   Take 0.6 mg by mouth daily as needed (for gout flares).          . cyanocobalamin (,VITAMIN B-12,) 1000 MCG/ML injection   Intramuscular   Inject 1,000 mcg into the muscle every 30 (thirty) days.         Marland Kitchen escitalopram (LEXAPRO) 10 MG tablet   Oral   Take 10 mg by mouth daily.         Marland Kitchen esomeprazole (NEXIUM) 40 MG capsule   Oral   Take 40 mg by mouth daily before breakfast.         . isosorbide mononitrate (IMDUR) 30 MG 24 hr tablet   Oral   Take 30 mg by mouth daily.      11   . KLOR-CON M20 20 MEQ tablet      TAKE 1 TABLET DAILY   90 tablet   0   . lisinopril (PRINIVIL,ZESTRIL) 5 MG tablet   Oral   Take 5 mg by mouth daily.         . metoprolol succinate (TOPROL-XL) 100 MG 24 hr tablet   Oral   Take 100 mg by mouth daily. Take with or immediately following a meal.         . torsemide (DEMADEX) 20 MG tablet   Oral   Take 2 tablets (40 mg total) by mouth 2 (two) times daily.   360 tablet   3   . fluticasone (FLONASE) 50 MCG/ACT nasal spray   Each Nare   Place 1 spray into both nostrils daily.   29.7 g   3   . fluticasone (FLONASE) 50 MCG/ACT nasal spray   Each Nare   Place 1 spray into both nostrils daily.   9.9 g   0   . montelukast (SINGULAIR) 10 MG tablet   Oral   Take 1 tablet (10 mg total) by mouth at bedtime.   90 tablet   3   . montelukast (SINGULAIR) 10 MG tablet   Oral   Take 1 tablet (10 mg total) by mouth daily.   30 tablet   0   . umeclidinium-vilanterol (ANORO ELLIPTA) 62.5-25 MCG/INH AEPB   Inhalation   Inhale 1 puff into the lungs daily.   180 each   3   . umeclidinium-vilanterol (ANORO ELLIPTA) 62.5-25 MCG/INH AEPB   Inhalation   Inhale 1 puff into the lungs daily.   7 each   0      Review of Systems: Gen:  Denies  fever, sweats, chills HEENT: Denies blurred vision, double vision, ear pain, eye pain, hearing loss, nose bleeds, sore throat Cvc:  No dizziness, chest pain or heaviness Resp:    Mild cough, mild sinus congestion, mild nasal drainage. Gi: Denies swallowing difficulty, stomach pain, nausea or vomiting, diarrhea, constipation, bowel incontinence Gu:  Denies bladder incontinence, burning urine Ext:   No Joint pain, stiffness or swelling Skin: No skin rash, easy bruising or bleeding or hives Endoc:  No polyuria, polydipsia , polyphagia or weight change Other:  All other systems negative  Allergies:  Review of patient's allergies indicates no known allergies.  Physical Examination:  VS: BP 130/68 mmHg  Pulse 94  Ht 6\' 3"  (1.905 m)  Wt 259 lb 6.4 oz (117.663 kg)  BMI 32.42 kg/m2  SpO2 95%  General Appearance: No distress  HEENT: PERRLA, no ptosis, no other lesions noticed Pulmonary:normal breath sounds., diaphragmatic excursion normal.No wheezing, No rales   Cardiovascular:  Normal S1,S2.  No m/r/g.     Abdomen:Exam: Benign, Soft, non-tender, No masses  Skin:   warm, no rashes, no ecchymosis  Extremities: normal, no cyanosis, clubbing, warm with normal capillary refill.  Trace pedal edema  Radiology: (The following images and results were reviewed by Dr. Stevenson Clinch on 02/02/2016). CXR 01/18/16 CLINICAL DATA: 75 year old male with cough and shortness of breath.  EXAM: CHEST 2 VIEW  COMPARISON: 01/16/2016 and prior exams  FINDINGS: Cardiomegaly, CABG changes and left-sided ICD/pacemaker noted.  Pulmonary vascular congestion has decreased.  Very small bilateral pleural effusions are noted.  There is no evidence of airspace disease or pneumothorax there  IMPRESSION: Cardiomegaly with decreased pulmonary vascular congestion.  Very small bilateral pleural effusions.  Assessment and Plan:74 yo Male with PMHx of dCHF, seen in follow up today for bilateral pleural effusion and COPD COPD (chronic obstructive pulmonary disease) ([place]) Patient is a never smoker, had significant secondhand smoking exposure from his parents for about 18 years. He has  moderate obstruction on his recent PFTs. Dyspnea = 5, during 6 minute walk test.  Given his symptoms, shortness of breath with exertion, mild cough, nasal congestion, along with his very function testing findings, we'll start him on a bronchodilator. Today we discussed initiation of Anoro, he is in agreement with. Use of this medication, proper administration, proper technique, and education discussed with the patient today   Plan: -Anoro, 62.75mcg, 1 puff daily, -gargle and rinse after each use.  -Avoid allergens -Rescue inhaler, albuterol.  Allergic rhinitis Patient having allergic rhinitis given the rapid weather changes. Plan: - Singuliar 10 mg, 1 tab daily during allergy season. Disp 90, Refill -3 - flonase 105mcg - 1 spray to each nostril daily - until allergy season is over.   Pleural effusion, bilateral Multifactorial - CHF, OSA, Cardiomyopathy, poor EF, Afib, elevated RSVP, possible PAH or pHTN   Since his last visit he has had moderate improvement in his dyspnea, he does not feel the need to pursue a RCA, which I am in agreement with today, based on his clinical status.   At this time the majority of his bilateral fusions related to his CHF. Review of his x-ray today showed minimal bilateral pleural effusions. Again, diuresis along with monitoring of input/output, and salt intake will assist in improvement of his respiratory status.  Plan - supportive care - cont with CHF regiment which is diuretics and BP management (see med list).  - diet, exercise         Updated Medication List Outpatient Encounter Prescriptions as of 02/02/2016  Medication Sig  . allopurinol (ZYLOPRIM) 100 MG tablet Take 1 tablet (100 mg total) by mouth daily.  Marland Kitchen apixaban (ELIQUIS) 5 MG TABS tablet Take 5 mg by mouth 2 (two) times daily.  . colchicine 0.6 MG tablet Take 0.6 mg by mouth daily as needed (for gout flares).   . cyanocobalamin (,VITAMIN B-12,) 1000 MCG/ML injection Inject 1,000  mcg into the muscle every 30 (thirty) days.  Marland Kitchen escitalopram (LEXAPRO) 10 MG tablet Take 10 mg by mouth daily.  Marland Kitchen esomeprazole (NEXIUM) 40 MG capsule Take 40 mg by mouth daily before breakfast.  . isosorbide  mononitrate (IMDUR) 30 MG 24 hr tablet Take 30 mg by mouth daily.  Marland Kitchen KLOR-CON M20 20 MEQ tablet TAKE 1 TABLET DAILY  . lisinopril (PRINIVIL,ZESTRIL) 5 MG tablet Take 5 mg by mouth daily.  . metoprolol succinate (TOPROL-XL) 100 MG 24 hr tablet Take 100 mg by mouth daily. Take with or immediately following a meal.  . torsemide (DEMADEX) 20 MG tablet Take 2 tablets (40 mg total) by mouth 2 (two) times daily.  . fluticasone (FLONASE) 50 MCG/ACT nasal spray Place 1 spray into both nostrils daily.  . fluticasone (FLONASE) 50 MCG/ACT nasal spray Place 1 spray into both nostrils daily.  . montelukast (SINGULAIR) 10 MG tablet Take 1 tablet (10 mg total) by mouth at bedtime.  . montelukast (SINGULAIR) 10 MG tablet Take 1 tablet (10 mg total) by mouth daily.  Marland Kitchen umeclidinium-vilanterol (ANORO ELLIPTA) 62.5-25 MCG/INH AEPB Inhale 1 puff into the lungs daily.  Marland Kitchen umeclidinium-vilanterol (ANORO ELLIPTA) 62.5-25 MCG/INH AEPB Inhale 1 puff into the lungs daily.   No facility-administered encounter medications on file as of 02/02/2016.    Orders for this visit: No orders of the defined types were placed in this encounter.    Thank  you for the visitation and for allowing  [facility] to assist in the care of your patient. Our recommendations are noted above.  Please contact us if we can be of further service.  Vilinda Boehringer, MD [facility] Office Number: [phone]

## 2016-02-02 NOTE — Patient Instructions (Signed)
Follow up with Dr. Stevenson Clinch in: 3 months - Anoro 62.67mcg, 1 puff in the AM, -gargle and rinse after each use.  - Singuliar 10 mg, 1 tab daily during allergy season. Disp 90, Refill -3 - cont with close monitoring of your fluid intake - flonase 51mcg - 1 spray to each nostril daily - until allergy season is over.

## 2016-03-01 ENCOUNTER — Encounter: Payer: Self-pay | Admitting: Internal Medicine

## 2016-03-01 DIAGNOSIS — H9193 Unspecified hearing loss, bilateral: Secondary | ICD-10-CM

## 2016-03-02 NOTE — Telephone Encounter (Signed)
Patient would like referral.

## 2016-03-04 ENCOUNTER — Encounter: Payer: Self-pay | Admitting: Internal Medicine

## 2016-03-09 ENCOUNTER — Ambulatory Visit (INDEPENDENT_AMBULATORY_CARE_PROVIDER_SITE_OTHER): Payer: Medicare Other

## 2016-03-09 DIAGNOSIS — E538 Deficiency of other specified B group vitamins: Secondary | ICD-10-CM | POA: Diagnosis not present

## 2016-03-09 MED ORDER — CYANOCOBALAMIN 1000 MCG/ML IJ SOLN
1000.0000 ug | Freq: Once | INTRAMUSCULAR | Status: AC
  Administered 2016-03-09: 1000 ug via INTRAMUSCULAR

## 2016-03-09 NOTE — Progress Notes (Signed)
Patient came in for b12 injection.  Patient received injection in Left deltoid.  Patient tolerated well.

## 2016-03-10 ENCOUNTER — Ambulatory Visit: Payer: Medicare Other | Attending: Family | Admitting: Family

## 2016-03-10 ENCOUNTER — Encounter: Payer: Self-pay | Admitting: Family

## 2016-03-10 VITALS — BP 92/41 | HR 88 | Resp 18 | Ht 75.0 in | Wt 257.0 lb

## 2016-03-10 DIAGNOSIS — I10 Essential (primary) hypertension: Secondary | ICD-10-CM | POA: Insufficient documentation

## 2016-03-10 DIAGNOSIS — Z79899 Other long term (current) drug therapy: Secondary | ICD-10-CM | POA: Diagnosis not present

## 2016-03-10 DIAGNOSIS — K219 Gastro-esophageal reflux disease without esophagitis: Secondary | ICD-10-CM | POA: Diagnosis not present

## 2016-03-10 DIAGNOSIS — Z9889 Other specified postprocedural states: Secondary | ICD-10-CM | POA: Insufficient documentation

## 2016-03-10 DIAGNOSIS — E785 Hyperlipidemia, unspecified: Secondary | ICD-10-CM | POA: Diagnosis not present

## 2016-03-10 DIAGNOSIS — I482 Chronic atrial fibrillation, unspecified: Secondary | ICD-10-CM

## 2016-03-10 DIAGNOSIS — Z7901 Long term (current) use of anticoagulants: Secondary | ICD-10-CM | POA: Diagnosis not present

## 2016-03-10 DIAGNOSIS — I2510 Atherosclerotic heart disease of native coronary artery without angina pectoris: Secondary | ICD-10-CM | POA: Diagnosis not present

## 2016-03-10 DIAGNOSIS — G4733 Obstructive sleep apnea (adult) (pediatric): Secondary | ICD-10-CM | POA: Diagnosis not present

## 2016-03-10 DIAGNOSIS — I959 Hypotension, unspecified: Secondary | ICD-10-CM | POA: Diagnosis not present

## 2016-03-10 DIAGNOSIS — J449 Chronic obstructive pulmonary disease, unspecified: Secondary | ICD-10-CM | POA: Insufficient documentation

## 2016-03-10 DIAGNOSIS — I252 Old myocardial infarction: Secondary | ICD-10-CM | POA: Insufficient documentation

## 2016-03-10 DIAGNOSIS — D649 Anemia, unspecified: Secondary | ICD-10-CM | POA: Insufficient documentation

## 2016-03-10 DIAGNOSIS — I950 Idiopathic hypotension: Secondary | ICD-10-CM

## 2016-03-10 DIAGNOSIS — I5022 Chronic systolic (congestive) heart failure: Secondary | ICD-10-CM | POA: Diagnosis not present

## 2016-03-10 DIAGNOSIS — J439 Emphysema, unspecified: Secondary | ICD-10-CM

## 2016-03-10 DIAGNOSIS — Z950 Presence of cardiac pacemaker: Secondary | ICD-10-CM | POA: Diagnosis not present

## 2016-03-10 DIAGNOSIS — Z95810 Presence of automatic (implantable) cardiac defibrillator: Secondary | ICD-10-CM | POA: Insufficient documentation

## 2016-03-10 DIAGNOSIS — Z8546 Personal history of malignant neoplasm of prostate: Secondary | ICD-10-CM | POA: Insufficient documentation

## 2016-03-10 DIAGNOSIS — I255 Ischemic cardiomyopathy: Secondary | ICD-10-CM | POA: Insufficient documentation

## 2016-03-10 NOTE — Patient Instructions (Signed)
Continue weighing daily and call for an overnight weight gain of > 2 pounds or a weekly weight gain of >5 pounds. 

## 2016-03-10 NOTE — Progress Notes (Signed)
Subjective:    Patient ID: Rick Gilmore, male    DOB: [birth year], 75 y.o.   MRN: [id number]  Congestive Heart Failure Presents for follow-up visit. The disease course has been improving. Pertinent negatives include no abdominal pain, chest pain, edema, fatigue, orthopnea, palpitations or shortness of breath. The symptoms have been improving. Past treatments include ACE inhibitors, beta blockers, salt and fluid restriction and exercise. The treatment provided moderate relief. Compliance with prior treatments has been good. His past medical history is significant for anemia, arrhythmia, CAD, chronic lung disease and HTN. There is no history of CVA or DM. He has multiple 2nd degree relatives with heart disease.  Atrial Fibrillation Presents for follow-up visit. Symptoms include hypotension. Symptoms are negative for chest pain, dizziness, palpitations, shortness of breath and weakness. The symptoms have been stable. Past treatments include anticoagulant, beta blockers and pacemaker. Compliance with prior treatments has been good. Past medical history includes atrial fibrillation, AICD, CAD, CHF and HTN. There are no medication compliance problems.    Past Medical History  Diagnosis Date  . CAD (coronary artery disease)   . Atrial fibrillation ([place])     on Coumadin  . Hypertension   . Erectile dysfunction   . Hyperlipidemia   . Ischemic cardiomyopathy     s/p AICD/pacer 2009, replaced 2010 [facility]  . Cancer [facility]) 2001    Prostate, XRT and implant  . Tubular adenoma     colon polyp  . Personal history of prostate cancer 2001    s/p XRT and implant ([place])  . Sleep apnea, obstructive     uses CPAP nightly  . Anemia   . Thrombocytopenia ([place])   . Prostate cancer ([place])   . CHF (congestive heart failure) ([place])   . MI (myocardial infarction) ([place]) 1988  . Arrhythmia     a fib  . COPD (chronic obstructive pulmonary disease) ([place])   . Vitamin B 12 deficiency   . AICD (automatic  cardioverter/defibrillator) present   . GERD (gastroesophageal reflux disease)     Past Surgical History  Procedure Laterality Date  . Vasectomy    . Coronary artery bypass graft  1988  . Implantable cardioverter defibrillator generator change  April 2014    Bi V RRR  . Insert / replace / remove pacemaker  2014  . Cardiac catheterization    . Colonoscopy with propofol [name] 05/30/2015    Procedure: COLONOSCOPY WITH PROPOFOL;  Surgeon: Manya Silvas, MD;  Location[facility] END[facility]COPY;  Service: Endoscopy;  Laterality: [name];  . Esophagogastroduodenoscopy [name] 05/30/2015    Procedure: ESOPHAGOGASTRODUODENOSCOPY (EGD);  Surgeon: Manya Silvas, MD;  Location: [facility] ENDOSCOPY;  Service: Endoscopy;  Laterality: [name];    Family History  Problem Relation Age of Onset  . Hypertension Mother   . Hypertension Father   . Cancer Father     prostate, throat  . Emphysema Father   . Heart disease Maternal Grandmother   . Heart disease Paternal Grandmother     Social History  Substance Use Topics  . Smoking status: Never Smoker   . Smokeless tobacco: Never Used  . Alcohol Use: 1.0 oz/week    2 Standard drinks or equivalent per week    No Known Allergies  Prior to Admission medications   Medication Sig Start Date End Date Taking? Authorizing Provider  allopurinol (ZYLOPRIM) 100 MG tablet Take 1 tablet (100 mg total) by mouth daily. 07/21/15  Yes Wallene Huh, DPM  apixaban (ELIQUIS) 5 MG TABS tablet Take 5  mg by mouth 2 (two) times daily.   Yes Historical Provider, MD  colchicine 0.6 MG tablet Take 0.6 mg by mouth daily as needed (for gout flares).    Yes Historical Provider, MD  cyanocobalamin (,VITAMIN B-12,) 1000 MCG/ML injection Inject 1,000 mcg into the muscle every 30 (thirty) days.   Yes Historical Provider, MD  escitalopram (LEXAPRO) 10 MG tablet Take 10 mg by mouth daily.   Yes Historical Provider, MD  esomeprazole (NEXIUM) 40 MG capsule Take 40 mg by mouth daily before breakfast.    Yes Historical Provider, MD  fluticasone (FLONASE) 50 MCG/ACT nasal spray Place 1 spray into both nostrils daily. 02/02/16 02/01/17 Yes Vishal Mungal, MD  isosorbide mononitrate (IMDUR) 30 MG 24 hr tablet Take 30 mg by mouth daily.   Yes Historical Provider, MD  KLOR-CON M20 20 MEQ tablet TAKE 1 TABLET DAILY 01/30/16  Yes Crecencio Mc, MD  lisinopril (PRINIVIL,ZESTRIL) 5 MG tablet Take 5 mg by mouth daily.   Yes Historical Provider, MD  metoprolol succinate (TOPROL-XL) 100 MG 24 hr tablet Take 100 mg by mouth daily. Take with or immediately following a meal.   Yes Historical Provider, MD  montelukast (SINGULAIR) 10 MG tablet Take 1 tablet (10 mg total) by mouth at bedtime. 02/02/16  Yes Vishal Mungal, MD  torsemide (DEMADEX) 20 MG tablet Take 2 tablets (40 mg total) by mouth 2 (two) times daily. 01/28/16  Yes Alisa Graff, FNP  umeclidinium-vilanterol (ANORO ELLIPTA) 62.5-25 MCG/INH AEPB Inhale 1 puff into the lungs daily. 02/02/16  Yes Vilinda Boehringer, MD      Review of Systems  Constitutional: Negative for appetite change and fatigue.  HENT: Positive for hearing loss. Negative for congestion, postnasal drip and sore throat.   Eyes: Negative.   Respiratory: Negative for cough, chest tightness, shortness of breath and wheezing.   Cardiovascular: Negative for chest pain, palpitations and leg swelling.  Gastrointestinal: Negative for abdominal pain and abdominal distention.  Endocrine: Negative.   Genitourinary: Negative.   Musculoskeletal: Negative for back pain and neck pain.  Skin: Negative.   Allergic/Immunologic: Negative.   Neurological: Negative for dizziness, weakness, light-headedness and headaches.  Hematological: Negative for adenopathy. Bruises/bleeds easily.  Psychiatric/Behavioral: Negative for sleep disturbance and dysphoric mood. The patient is not nervous/anxious.        Objective:   Physical Exam  Constitutional: He is oriented to person, place, and time. He appears  well-developed and well-nourished.  HENT:  Head: Normocephalic and atraumatic.  Eyes: Conjunctivae are normal. Pupils are equal, round, and reactive to light.  Neck: Normal range of motion. Neck supple.  Cardiovascular: Normal rate.  An irregular rhythm present.  Pulmonary/Chest: Effort normal. He has no wheezes. He has no rales.  Abdominal: Soft. He exhibits no distension. There is no tenderness.  Musculoskeletal: He exhibits no edema or tenderness.  Neurological: He is alert and oriented to person, place, and time.  Skin: Skin is warm and dry.  Psychiatric: He has a normal mood and affect. His behavior is normal. Thought content normal.  Nursing note and vitals reviewed.  BP 92/41 mmHg  Pulse 88  Resp 18  Ht 6\' 3"  (1.905 m)  Wt 257 lb (116.574 kg)  BMI 32.12 kg/m2  SpO2 96%        Assessment & Plan:  1: Chronic heart failure with reduced ejection fraction- Patient presents currently without any fatigue, shortness of breath or swelling in his legs/abdomen (Class I). He continues to weigh himself daily and says  that his weight has been stable. By our scale, his weight is unchanged. Reminded to call for an overnight weight gain of >2 pounds or a weekly weight gain of >5 pounds. He is not adding any salt to his food and does try to closely follow a low sodium diet. Is reading food labels more often than he was so that he can stay under a 2000mg  sodium diet. Discussed previously about changing his lisinopril to entresto but he is currently without any symptoms. Will keep him on lisinopril at this time and switch to entresto in the future if his symptoms return. Patient is agreeable to this.  2: Chronic atrial fibrillation- Patient is currently rate controlled with toprol XL and also takes eliquis. Follows closely with cardiology. 3: Hypotension- Blood pressure on the low side but he's currently without any dizziness or lightheadedness. Will continue to monitor. 4: COPD- Is now using an  inhaler and overall says that he feels better than he has in a long time. Follows with pulmonology.  Return here in 3 months or sooner for any questions/problems before then.

## 2016-04-04 ENCOUNTER — Encounter: Payer: Self-pay | Admitting: Internal Medicine

## 2016-04-13 ENCOUNTER — Ambulatory Visit: Payer: Medicare Other

## 2016-04-19 ENCOUNTER — Ambulatory Visit (INDEPENDENT_AMBULATORY_CARE_PROVIDER_SITE_OTHER): Payer: Medicare Other | Admitting: Internal Medicine

## 2016-04-19 VITALS — BP 100/70 | HR 77 | Temp 98.4°F | Resp 18 | Wt 260.5 lb

## 2016-04-19 DIAGNOSIS — E538 Deficiency of other specified B group vitamins: Secondary | ICD-10-CM | POA: Diagnosis not present

## 2016-04-19 DIAGNOSIS — E669 Obesity, unspecified: Secondary | ICD-10-CM

## 2016-04-19 DIAGNOSIS — H918X2 Other specified hearing loss, left ear: Secondary | ICD-10-CM | POA: Diagnosis not present

## 2016-04-19 DIAGNOSIS — H6122 Impacted cerumen, left ear: Secondary | ICD-10-CM

## 2016-04-19 DIAGNOSIS — G4733 Obstructive sleep apnea (adult) (pediatric): Secondary | ICD-10-CM | POA: Diagnosis not present

## 2016-04-19 DIAGNOSIS — Z79899 Other long term (current) drug therapy: Secondary | ICD-10-CM

## 2016-04-19 LAB — COMPREHENSIVE METABOLIC PANEL
ALT: 8 U/L (ref 0–53)
AST: 13 U/L (ref 0–37)
Albumin: 4.2 g/dL (ref 3.5–5.2)
Alkaline Phosphatase: 61 U/L (ref 39–117)
BUN: 20 mg/dL (ref 6–23)
CO2: 30 mEq/L (ref 19–32)
Calcium: 9.1 mg/dL (ref 8.4–10.5)
Chloride: 104 mEq/L (ref 96–112)
Creatinine, Ser: 1.07 mg/dL (ref 0.40–1.50)
GFR: 71.69 mL/min (ref >60.00)
Glucose, Bld: 109 mg/dL — ABNORMAL HIGH (ref 70–99)
Potassium: 4 mEq/L (ref 3.5–5.1)
Sodium: 140 mEq/L (ref 135–145)
Total Bilirubin: 0.8 mg/dL (ref 0.2–1.2)
Total Protein: 6.4 g/dL (ref 6.0–8.3)

## 2016-04-19 MED ORDER — CYANOCOBALAMIN 1000 MCG/ML IJ SOLN
1000.0000 ug | Freq: Once | INTRAMUSCULAR | Status: AC
  Administered 2016-04-19: 1000 ug via INTRAMUSCULAR

## 2016-04-19 NOTE — Progress Notes (Signed)
Pre visit review using our clinic review tool, if applicable. No additional management support is needed unless otherwise documented below in the visit note. 

## 2016-04-19 NOTE — Patient Instructions (Addendum)
Your right ear is a little irritated from cleaning,  But it is not infected.  Your left ear  is completely blocked with ear wax .  Try using Debrox in your left ear at night until you come back for an irrigation      The  diet I discussed with you today is the 10 day Green Smoothie Cleansing /Detox Diet by Linden Dolin . available on [place] for around $10.  It does require a blender, (Vita Mix, a electric juicer,  Or a Nutribullet Rx).  This is not a low carb or a weight loss diet,  It is fundamentally a "cleansing" low fat diet that eliminates sugar, gluten, caffeine, alcohol and dairy for 10 days .  What you add back after the initial ten days is entirely up to  you!  You can expect to lose 5 to 10 lbs depending on how strict you are.   I found that  drinking 2 smoothies or juices  daily and keeping one chewable meal (but keep it simple, like baked fish and salad, rice or bok choy) kept me satisfied and kept me from straying  .  You snack primarily on fresh  fruit, egg whites and judicious quantities of nuts.  You can add a  vegetable based protein powder  to any smoothie made with almond milk (nothing with whey , since whey is dairy)  [employer] has a few but  the [employer] has the greatest  selection .  Using frozen fruits is much more convenient and cost effective. You can even find plenty of organic fruit in the frozen fruit section of [employer].  Just thaw what you need for the following day the night before in the refrigerator (to avoid jamming up your machine)   The organic vegan protein powder I tried  is called Vega" and I found it at [employer] .  It is sugar free. Tastes like crap.  My advice:  Sarina Ser your protein  (eat an egg or two in the am with your smoothie or add soy yogurt for protein ) ,  Don't ruin the taste of your smoothies with protein powder unless you can find one you really love.

## 2016-04-20 DIAGNOSIS — E669 Obesity, unspecified: Secondary | ICD-10-CM | POA: Insufficient documentation

## 2016-04-20 NOTE — Progress Notes (Signed)
Subjective:  Patient ID: Rick Gilmore, male    DOB: Mar 17, 1941  Age: 75 y.o. MRN: [id number]  CC: The primary encounter diagnosis was Hearing loss of left ear due to cerumen impaction. Diagnoses of Long-term use of high-risk medication, B12 deficiency, Sleep apnea, obstructive, and Obesity were also pertinent to this visit.  HPI Rick Gilmore presents for follow up on cardiomyopathy with systolic dysfunction .  He has been monitoring his weight daily and has gained several lbs during a recent trip, but has returned to his baseline weight. .  He denies shortness of breath.   He has been indulgent in diet and feels he has gained weight due to excess calories. He does not exercise on a regular basis but is active amd IADLs/ .   He has developed decreased hearing in his left ear., without any history of trauma.     Outpatient Prescriptions Prior to Visit  Medication Sig Dispense Refill  . allopurinol (ZYLOPRIM) 100 MG tablet Take 1 tablet (100 mg total) by mouth daily. 90 tablet 3  . apixaban (ELIQUIS) 5 MG TABS tablet Take 5 mg by mouth 2 (two) times daily.    . colchicine 0.6 MG tablet Take 0.6 mg by mouth daily as needed (for gout flares).     . cyanocobalamin (,VITAMIN B-12,) 1000 MCG/ML injection Inject 1,000 mcg into the muscle every 30 (thirty) days.    Marland Kitchen escitalopram (LEXAPRO) 10 MG tablet Take 10 mg by mouth daily.    Marland Kitchen esomeprazole (NEXIUM) 40 MG capsule Take 40 mg by mouth daily before breakfast.    . fluticasone (FLONASE) 50 MCG/ACT nasal spray Place 1 spray into both nostrils daily. 9.9 g 0  . isosorbide mononitrate (IMDUR) 30 MG 24 hr tablet Take 30 mg by mouth daily.  11  . KLOR-CON M20 20 MEQ tablet TAKE 1 TABLET DAILY 90 tablet 0  . lisinopril (PRINIVIL,ZESTRIL) 5 MG tablet Take 5 mg by mouth daily.    . metoprolol succinate (TOPROL-XL) 100 MG 24 hr tablet Take 100 mg by mouth daily. Take with or immediately following a meal.    . montelukast (SINGULAIR) 10 MG tablet Take 1  tablet (10 mg total) by mouth at bedtime. 90 tablet 3  . torsemide (DEMADEX) 20 MG tablet Take 2 tablets (40 mg total) by mouth 2 (two) times daily. 360 tablet 3  . umeclidinium-vilanterol (ANORO ELLIPTA) 62.5-25 MCG/INH AEPB Inhale 1 puff into the lungs daily. 180 each 3   No facility-administered medications prior to visit.    Review of Systems;  Patient denies headache, fevers, malaise, unintentional weight loss, skin rash, eye pain, sinus congestion and sinus pain, sore throat, dysphagia,  hemoptysis , cough, dyspnea, wheezing, chest pain, palpitations, orthopnea, edema, abdominal pain, nausea, melena, diarrhea, constipation, flank pain, dysuria, hematuria, urinary  Frequency, nocturia, numbness, tingling, seizures,  Focal weakness, Loss of consciousness,  Tremor, insomnia, depression, anxiety, and suicidal ideation.      Objective:  BP 100/70 mmHg  Pulse 77  Temp(Src) 98.4 F (36.9 C) (Oral)  Resp 18  Wt 260 lb 8 oz (118.162 kg)  SpO2 97%  BP Readings from Last 3 Encounters:  04/19/16 100/70  03/10/16 92/41  02/02/16 130/68    Wt Readings from Last 3 Encounters:  04/19/16 260 lb 8 oz (118.162 kg)  03/10/16 257 lb (116.574 kg)  02/02/16 259 lb 6.4 oz (117.663 kg)    General appearance: alert, cooperative and appears stated age Ears: right ear canal cclealr of wax.  Mild erythema outer canal TM normal.  Left canal occluded by ear wax canals both ears Throat: lips, mucosa, and tongue normal; teeth and gums normal Neck: no adenopathy, no carotid bruit, supple, symmetrical, trachea midline and thyroid not enlarged, symmetric, no tenderness/mass/nodules Back: symmetric, no curvature. ROM normal. No CVA tenderness. Lungs: clear to auscultation bilaterally Heart: regular rate and rhythm, S1, S2 normal, no murmur, click, rub or gallop Abdomen: soft, non-tender; bowel sounds normal; no masses,  no organomegaly Pulses: 2+ and symmetric Skin: Skin color, texture, turgor normal. No  rashes or lesions Lymph nodes: Cervical, supraclavicular, and axillary nodes normal. Ext; mild pitting edema.  Lab Results  Component Value Date   HGBA1C 5.1 01/16/2016   HGBA1C 5.9 11/23/2014   HGBA1C 5.9 11/21/2014    Lab Results  Component Value Date   CREATININE 1.07 04/19/2016   CREATININE 1.12 01/19/2016   CREATININE 1.10 01/18/2016    Lab Results  Component Value Date   WBC 3.9 01/19/2016   HGB 10.6* 01/19/2016   HCT 31.5* 01/19/2016   PLT 83* 01/19/2016   GLUCOSE 109* 04/19/2016   CHOL 135 10/28/2015   TRIG 74.0 10/28/2015   HDL 35.20* 10/28/2015   LDLDIRECT 59.9 11/05/2011   LDLCALC 85 10/28/2015   ALT 8 04/19/2016   AST 13 04/19/2016   NA 140 04/19/2016   K 4.0 04/19/2016   CL 104 04/19/2016   CREATININE 1.07 04/19/2016   BUN 20 04/19/2016   CO2 30 04/19/2016   TSH 1.82 01/22/2015   INR 2.3 06/17/2014   HGBA1C 5.1 01/16/2016    Dg Chest 2 View  01/16/2016  CLINICAL DATA:  Shortness of breath, coughing EXAM: CHEST  2 VIEW COMPARISON:  11/20/2014 FINDINGS: There is bilateral interstitial thickening and prominence of the central pulmonary vasculature. There is no pleural effusion or pneumothorax. There is stable cardiomegaly. There is a dual lead cardiac pacemaker. There is evidence of prior CABG. The osseous structures are unremarkable. IMPRESSION: Cardiomegaly with mild pulmonary vascular congestion. Electronically Signed   By: Kathreen Devoid   On: 01/16/2016 13:08    Assessment & Plan:   Problem List Items Addressed This Visit    Sleep apnea, obstructive    Diagnosed by prior sleep study. Patient is using CPAP every night a minimum of 6 hours per night and notes improved daytime wakefulness and decreased fatigue       Obesity    I have addressed  BMI and recommended wt loss of 10% of body weight over the next 6 months using a low fat, low starch, high protein  fruit/vegetable based Mediterranean diet and 30 minutes of aerobic exercise a minimum of 5 days  per week.        Hearing loss of left ear due to cerumen impaction - Primary    Other Visit Diagnoses    Long-term use of high-risk medication        Relevant Orders    Comp Met (CMET) (Completed)    B12 deficiency        Relevant Medications    cyanocobalamin ((VITAMIN B-12)) injection 1,000 mcg (Completed)      A total of 25 minutes of face to face time was spent with patient more than half of which was spent in counselling about the above mentioned conditions  and coordination of care  I am having Mr. Makela maintain his cyanocobalamin, isosorbide mononitrate, colchicine, allopurinol, apixaban, escitalopram, esomeprazole, lisinopril, metoprolol succinate, torsemide, KLOR-CON M20, umeclidinium-vilanterol, montelukast, and fluticasone. We administered cyanocobalamin.  Meds ordered this encounter  Medications  . cyanocobalamin ((VITAMIN B-12)) injection 1,000 mcg    Sig:     There are no discontinued medications.  Follow-up: Return for rn visit for ear irrigation  3-4 days .   Crecencio Mc, MD

## 2016-04-20 NOTE — Assessment & Plan Note (Signed)
I have addressed  BMI and recommended wt loss of 10% of body weight over the next 6 months using a low fat, low starch, high protein  fruit/vegetable based Mediterranean diet and 30 minutes of aerobic exercise a minimum of 5 days per week.   

## 2016-04-20 NOTE — Assessment & Plan Note (Signed)
Diagnosed by prior sleep study. Patient is using CPAP every night a minimum of 6 hours per night and notes improved daytime wakefulness and decreased fatigue  

## 2016-04-21 ENCOUNTER — Encounter: Payer: Self-pay | Admitting: Internal Medicine

## 2016-04-22 ENCOUNTER — Ambulatory Visit (INDEPENDENT_AMBULATORY_CARE_PROVIDER_SITE_OTHER): Payer: Medicare Other

## 2016-04-22 ENCOUNTER — Telehealth: Payer: Self-pay | Admitting: Internal Medicine

## 2016-04-22 DIAGNOSIS — I482 Chronic atrial fibrillation: Secondary | ICD-10-CM | POA: Diagnosis not present

## 2016-04-22 DIAGNOSIS — I25810 Atherosclerosis of coronary artery bypass graft(s) without angina pectoris: Secondary | ICD-10-CM | POA: Diagnosis not present

## 2016-04-22 DIAGNOSIS — H9193 Unspecified hearing loss, bilateral: Secondary | ICD-10-CM | POA: Diagnosis not present

## 2016-04-22 DIAGNOSIS — I480 Paroxysmal atrial fibrillation: Secondary | ICD-10-CM | POA: Diagnosis not present

## 2016-04-22 DIAGNOSIS — I4430 Unspecified atrioventricular block: Secondary | ICD-10-CM | POA: Diagnosis not present

## 2016-04-22 DIAGNOSIS — I5022 Chronic systolic (congestive) heart failure: Secondary | ICD-10-CM | POA: Diagnosis not present

## 2016-04-22 MED ORDER — PANTOPRAZOLE SODIUM 40 MG PO TBEC: 40.0000 mg | DELAYED_RELEASE_TABLET | Freq: Every day | ORAL | Status: DC

## 2016-04-22 NOTE — Progress Notes (Signed)
Patient came in for Ear irrigation on the left ear.  Patient was instructed to use debrox for the past few days and then return for a nurse visit.  Inspected left ear, ear canal clear, no wax noted.  Patient states that when he was usin ghte debrox at home a large peanut sized amount of wax came out.    Please advise, no irrigation was done.  Thanks

## 2016-04-22 NOTE — Telephone Encounter (Signed)
My chart message set: protonix substitute for nexium

## 2016-04-26 NOTE — Progress Notes (Signed)
No irrigation needed.  No charge to patient \  Regards,   Deborra Medina, MD

## 2016-04-27 ENCOUNTER — Other Ambulatory Visit: Payer: Self-pay | Admitting: Internal Medicine

## 2016-05-18 ENCOUNTER — Ambulatory Visit (INDEPENDENT_AMBULATORY_CARE_PROVIDER_SITE_OTHER): Payer: Medicare Other | Admitting: Internal Medicine

## 2016-05-18 ENCOUNTER — Encounter: Payer: Self-pay | Admitting: Internal Medicine

## 2016-05-18 VITALS — BP 126/78 | HR 68 | Ht 75.0 in | Wt 264.0 lb

## 2016-05-18 DIAGNOSIS — J302 Other seasonal allergic rhinitis: Secondary | ICD-10-CM

## 2016-05-18 DIAGNOSIS — J439 Emphysema, unspecified: Secondary | ICD-10-CM

## 2016-05-18 DIAGNOSIS — J90 Pleural effusion, not elsewhere classified: Secondary | ICD-10-CM | POA: Diagnosis not present

## 2016-05-18 NOTE — Assessment & Plan Note (Signed)
Multifactorial - CHF, OSA, Cardiomyopathy, poor EF, Afib, elevated RSVP, possible PAH or pHTN  Chronic bilateral pleural effusion.   Since his last visit he has had moderate improvement in his dyspnea, he does not feel the need to pursue a RCA, which I am in agreement with today, based on his clinical status.   At this time the majority of his bilateral fusions related to his CHF. Review of his x-ray today showed minimal bilateral pleural effusions. Again, diuresis along with monitoring of input/output, and salt intake will assist in improvement of his respiratory status.  Plan - supportive care - cont with CHF regiment which is diuretics and BP management (see med list).  - diet, exercise

## 2016-05-18 NOTE — Assessment & Plan Note (Signed)
Patient is a never smoker, had significant secondhand smoking exposure from his parents for about 18 years. He has moderate obstruction on his recent PFTs. Dyspnea = 5, during 6 minute walk test.  Cont with  Anoro Use of this medication, proper administration, proper technique, and education discussed with the patient today   Plan: -Anoro, 62.5mcg, 1 puff daily, -gargle and rinse after each use.  -Avoid allergens -Rescue inhaler, albuterol.   

## 2016-05-18 NOTE — Progress Notes (Signed)
MRN# [id number] Rick Gilmore [birth year]   CC: Chief Complaint  Patient presents with  . Follow-up    pt states breathing is baseline. cont sob w/exertion. denies cough, wheezing or cp/tightness.       Brief History: 10/29/14 Patient is a pleasant 75 year old male referred by Dr. Derrel Nip for second opinion about bilateral pleural effusion and shortness of breath. Brief history - history of ischemic cardiomyopathy, CHF NYFC III, OSA, BiV ICD, atrial fibrillation, Iron Def Anemia, Mild thrombocytopenia, prostate cancer (s\p seed implants and XRT) and obesity. He was referred by his cardiologist to Dr. Raul Del [facility]) for evaluation of dyspnea. Bilateral pleural effusions were found on chestCT, moderate sized, R > L, with R lung atelectasis. No thoracentesis was ordered or planned by Dr. Raul Del. He is now wearing supplemental oxygen at night (2L). The initiation of Imdur at night has made a considerable improvement in his dyspnea,EF is still 35% by repeat ECHO,taking Eliquis for mitigation of CVA risk. Patient has no history of chest pain, pleurisy, calf pain or cough, no hemoptysis, no syncope, he doesn't a history of initial fibrillation with a decreased ejection fraction on anticoagulation. He also has a history of ulcerative sleep apnea, on CPAP, and is compliant with his CPAP machine. Review of records showed he saw Dr. Raul Del on 09/24/2014 for shortness of breath, Dr. Raul Del was working him up for possible pulmonary hypertension due to severely elevated RVSP (70.2) on an echo in July 2015, workup would include weight loss, continue CPAP, overnight oximetry, trial of anoro ellipta/albuterol, pulmonary rehabilitation, which strict salt and fluids, possible evaluation for right heart cardiac catheterization and follow-up in February 2016.  Patient was not satisfied with the above plan outlined by Dr. Raul Del, according to patient it was not clearly stated, and even though his  shortness of breath was moderately improving, he did not understand why he was having bilateral pleural effusions. Currently he can walk about 30 yards, still has 1-2+ pitting edema, but wears compression stockings. Patient also follows with cardiology closely, [facility] (Dr. Nehemiah Massed). He denies any fevers, chills, night sweats, rapid weight loss. Plan - poor cardiac function, no thoracentesis, continue follow up with Dr. Raul Del.    Events since last clinic visit: Patient here for follow up visit for chronic bilateral pleural effusions. Patient states since his last visit he's hada thickened worsening in his dyspnea on exertion. Is currently at baseline, no worsening cough, no fever, no sputum production. Overall he is doing well, his last chest x-ray was in February 2017 that did show small bilateral pleural effusions. He is currently monitoring his salt intake, fluid intake, and diet. He still remained somewhat inactive given his level of CHF at states that he will increase his exercise activity over the summer.   Medication:   Current Outpatient Rx  Name  Route  Sig  Dispense  Refill  . allopurinol (ZYLOPRIM) 100 MG tablet   Oral   Take 1 tablet (100 mg total) by mouth daily.   90 tablet   3   . apixaban (ELIQUIS) 5 MG TABS tablet   Oral   Take 5 mg by mouth 2 (two) times daily.         . colchicine 0.6 MG tablet   Oral   Take 0.6 mg by mouth daily as needed (for gout flares).          . cyanocobalamin (,VITAMIN B-12,) 1000 MCG/ML injection   Intramuscular   Inject 1,000 mcg into the  muscle every 30 (thirty) days.         Marland Kitchen escitalopram (LEXAPRO) 10 MG tablet   Oral   Take 10 mg by mouth daily.         . fluticasone (FLONASE) 50 MCG/ACT nasal spray   Each Nare   Place 1 spray into both nostrils daily.   9.9 g   0   . isosorbide mononitrate (IMDUR) 30 MG 24 hr tablet   Oral   Take 30 mg by mouth daily.      11   . KLOR-CON M20 20 MEQ tablet       TAKE 1 TABLET DAILY   90 tablet   1   . lisinopril (PRINIVIL,ZESTRIL) 5 MG tablet   Oral   Take 5 mg by mouth daily.         . metoprolol succinate (TOPROL-XL) 100 MG 24 hr tablet   Oral   Take 100 mg by mouth daily. Take with or immediately following a meal.         . montelukast (SINGULAIR) 10 MG tablet   Oral   Take 1 tablet (10 mg total) by mouth at bedtime.   90 tablet   3   . pantoprazole (PROTONIX) 40 MG tablet   Oral   Take 1 tablet (40 mg total) by mouth daily.   90 tablet   3   . torsemide (DEMADEX) 20 MG tablet   Oral   Take 2 tablets (40 mg total) by mouth 2 (two) times daily.   360 tablet   3   . umeclidinium-vilanterol (ANORO ELLIPTA) 62.5-25 MCG/INH AEPB   Inhalation   Inhale 1 puff into the lungs daily.   180 each   3      Review of Systems: Gen:  Denies  fever, sweats, chills HEENT: Denies blurred vision, double vision, ear pain, eye pain, hearing loss, nose bleeds, sore throat Cvc:  No dizziness, chest pain or heaviness Resp:   Mild cough, mild sinus congestion, mild nasal drainage. Gi: Denies swallowing difficulty, stomach pain, nausea or vomiting, diarrhea, constipation, bowel incontinence Gu:  Denies bladder incontinence, burning urine Ext:   No Joint pain, stiffness or swelling Skin: No skin rash, easy bruising or bleeding or hives Endoc:  No polyuria, polydipsia , polyphagia or weight change Other:  All other systems negative  Allergies:  Review of patient's allergies indicates no known allergies.  Physical Examination:  VS: There were no vitals taken for this visit.  General Appearance: No distress  HEENT: PERRLA, no ptosis, no other lesions noticed Pulmonary:normal breath sounds., diaphragmatic excursion normal.No wheezing, No rales   Cardiovascular:  Normal S1,S2.  No m/r/g.     Abdomen:Exam: Benign, Soft, non-tender, No masses  Skin:   warm, no rashes, no ecchymosis  Extremities: normal, no cyanosis, clubbing, warm with  normal capillary refill.  Trace pedal edema  Radiology: (The following images and results were reviewed by Dr. Stevenson Clinch on 05/18/2016). CXR 01/18/16 CLINICAL DATA: 75 year old male with cough and shortness of breath.  EXAM: CHEST 2 VIEW  COMPARISON: 01/16/2016 and prior exams  FINDINGS: Cardiomegaly, CABG changes and left-sided ICD/pacemaker noted.  Pulmonary vascular congestion has decreased.  Very small bilateral pleural effusions are noted.  There is no evidence of airspace disease or pneumothorax there  IMPRESSION: Cardiomegaly with decreased pulmonary vascular congestion.  Very small bilateral pleural effusions.  Assessment and Plan:74 yo Male with PMHx of dCHF, seen in follow up today for bilateral pleural effusion and COPD Pleural  effusion, bilateral Multifactorial - CHF, OSA, Cardiomyopathy, poor EF, Afib, elevated RSVP, possible PAH or pHTN  Chronic bilateral pleural effusion.   Since his last visit he has had moderate improvement in his dyspnea, he does not feel the need to pursue a RCA, which I am in agreement with today, based on his clinical status.   At this time the majority of his bilateral fusions related to his CHF. Review of his x-ray today showed minimal bilateral pleural effusions. Again, diuresis along with monitoring of input/output, and salt intake will assist in improvement of his respiratory status.  Plan - supportive care - cont with CHF regiment which is diuretics and BP management (see med list).  - diet, exercise         Allergic rhinitis Patient having allergic rhinitis given the rapid weather changes. Plan: - Singuliar 10 mg, 1 tab daily during allergy season. Disp 90, Refill -3 - flonase 44mcg - 1 spray to each nostril daily - until allergy season is over.     COPD (chronic obstructive pulmonary disease) ([place]) Patient is a never smoker, had significant secondhand smoking exposure from his parents for about 18 years. He  has moderate obstruction on his recent PFTs. Dyspnea = 5, during 6 minute walk test.  Cont with  Anoro Use of this medication, proper administration, proper technique, and education discussed with the patient today   Plan: -Anoro, 62.50mcg, 1 puff daily, -gargle and rinse after each use.  -Avoid allergens -Rescue inhaler, albuterol.      Updated Medication List Outpatient Encounter Prescriptions as of 05/18/2016  Medication Sig  . allopurinol (ZYLOPRIM) 100 MG tablet Take 1 tablet (100 mg total) by mouth daily.  Marland Kitchen apixaban (ELIQUIS) 5 MG TABS tablet Take 5 mg by mouth 2 (two) times daily.  . colchicine 0.6 MG tablet Take 0.6 mg by mouth daily as needed (for gout flares).   . cyanocobalamin (,VITAMIN B-12,) 1000 MCG/ML injection Inject 1,000 mcg into the muscle every 30 (thirty) days.  Marland Kitchen escitalopram (LEXAPRO) 10 MG tablet Take 10 mg by mouth daily.  . fluticasone (FLONASE) 50 MCG/ACT nasal spray Place 1 spray into both nostrils daily.  . isosorbide mononitrate (IMDUR) 30 MG 24 hr tablet Take 30 mg by mouth daily.  Marland Kitchen KLOR-CON M20 20 MEQ tablet TAKE 1 TABLET DAILY  . lisinopril (PRINIVIL,ZESTRIL) 5 MG tablet Take 5 mg by mouth daily.  . metoprolol succinate (TOPROL-XL) 100 MG 24 hr tablet Take 100 mg by mouth daily. Take with or immediately following a meal.  . montelukast (SINGULAIR) 10 MG tablet Take 1 tablet (10 mg total) by mouth at bedtime.  . pantoprazole (PROTONIX) 40 MG tablet Take 1 tablet (40 mg total) by mouth daily.  Marland Kitchen torsemide (DEMADEX) 20 MG tablet Take 2 tablets (40 mg total) by mouth 2 (two) times daily.  Marland Kitchen umeclidinium-vilanterol (ANORO ELLIPTA) 62.5-25 MCG/INH AEPB Inhale 1 puff into the lungs daily.   No facility-administered encounter medications on file as of 05/18/2016.    Orders for this visit: No orders of the defined types were placed in this encounter.    Thank  you for the visitation and for allowing  [facility] to assist in the  care of your patient. Our recommendations are noted above.  Please contact us if we can be of further service.  Vilinda Boehringer, MD [facility] Office Number: [phone]

## 2016-05-18 NOTE — Patient Instructions (Signed)
Follow up with Dr. Stevenson Clinch in:3 months - 2 view CXR for bilateral pleural effusion prior to next visit - cont with BP control - avoid all forms of tobacoo - allergy control  - diet and exercise as tolerated.

## 2016-05-18 NOTE — Assessment & Plan Note (Signed)
Patient having allergic rhinitis given the rapid weather changes. Plan: - Singuliar 10 mg, 1 tab daily during allergy season. Disp 90, Refill -3 - flonase 19mcg - 1 spray to each nostril daily - until allergy season is over.

## 2016-05-29 ENCOUNTER — Other Ambulatory Visit: Payer: Self-pay | Admitting: Internal Medicine

## 2016-06-22 ENCOUNTER — Ambulatory Visit: Payer: Medicare Other | Admitting: Family

## 2016-06-27 ENCOUNTER — Other Ambulatory Visit: Payer: Self-pay | Admitting: Podiatry

## 2016-06-28 ENCOUNTER — Ambulatory Visit: Payer: Medicare Other | Admitting: Family

## 2016-07-09 ENCOUNTER — Encounter: Payer: Self-pay | Admitting: Family

## 2016-07-09 ENCOUNTER — Ambulatory Visit: Payer: Medicare Other | Attending: Family | Admitting: Family

## 2016-07-09 VITALS — BP 106/51 | HR 84 | Resp 18 | Ht 75.0 in | Wt 254.0 lb

## 2016-07-09 DIAGNOSIS — Z8249 Family history of ischemic heart disease and other diseases of the circulatory system: Secondary | ICD-10-CM | POA: Diagnosis not present

## 2016-07-09 DIAGNOSIS — I959 Hypotension, unspecified: Secondary | ICD-10-CM | POA: Diagnosis not present

## 2016-07-09 DIAGNOSIS — I509 Heart failure, unspecified: Secondary | ICD-10-CM | POA: Insufficient documentation

## 2016-07-09 DIAGNOSIS — I950 Idiopathic hypotension: Secondary | ICD-10-CM

## 2016-07-09 DIAGNOSIS — G4733 Obstructive sleep apnea (adult) (pediatric): Secondary | ICD-10-CM | POA: Diagnosis not present

## 2016-07-09 DIAGNOSIS — Z9889 Other specified postprocedural states: Secondary | ICD-10-CM | POA: Diagnosis not present

## 2016-07-09 DIAGNOSIS — D649 Anemia, unspecified: Secondary | ICD-10-CM | POA: Insufficient documentation

## 2016-07-09 DIAGNOSIS — Z8546 Personal history of malignant neoplasm of prostate: Secondary | ICD-10-CM | POA: Insufficient documentation

## 2016-07-09 DIAGNOSIS — Z9852 Vasectomy status: Secondary | ICD-10-CM | POA: Diagnosis not present

## 2016-07-09 DIAGNOSIS — I2510 Atherosclerotic heart disease of native coronary artery without angina pectoris: Secondary | ICD-10-CM | POA: Diagnosis not present

## 2016-07-09 DIAGNOSIS — I110 Hypertensive heart disease with heart failure: Secondary | ICD-10-CM | POA: Insufficient documentation

## 2016-07-09 DIAGNOSIS — N529 Male erectile dysfunction, unspecified: Secondary | ICD-10-CM | POA: Insufficient documentation

## 2016-07-09 DIAGNOSIS — Z95810 Presence of automatic (implantable) cardiac defibrillator: Secondary | ICD-10-CM | POA: Insufficient documentation

## 2016-07-09 DIAGNOSIS — Z79899 Other long term (current) drug therapy: Secondary | ICD-10-CM | POA: Diagnosis not present

## 2016-07-09 DIAGNOSIS — K219 Gastro-esophageal reflux disease without esophagitis: Secondary | ICD-10-CM | POA: Insufficient documentation

## 2016-07-09 DIAGNOSIS — Z8042 Family history of malignant neoplasm of prostate: Secondary | ICD-10-CM | POA: Insufficient documentation

## 2016-07-09 DIAGNOSIS — I255 Ischemic cardiomyopathy: Secondary | ICD-10-CM | POA: Insufficient documentation

## 2016-07-09 DIAGNOSIS — I5022 Chronic systolic (congestive) heart failure: Secondary | ICD-10-CM

## 2016-07-09 DIAGNOSIS — E785 Hyperlipidemia, unspecified: Secondary | ICD-10-CM | POA: Insufficient documentation

## 2016-07-09 DIAGNOSIS — J449 Chronic obstructive pulmonary disease, unspecified: Secondary | ICD-10-CM | POA: Diagnosis not present

## 2016-07-09 DIAGNOSIS — I482 Chronic atrial fibrillation, unspecified: Secondary | ICD-10-CM

## 2016-07-09 DIAGNOSIS — I4891 Unspecified atrial fibrillation: Secondary | ICD-10-CM | POA: Insufficient documentation

## 2016-07-09 MED ORDER — UMECLIDINIUM-VILANTEROL 62.5-25 MCG/INH IN AEPB: 1.0000 | INHALATION_SPRAY | Freq: Every day | RESPIRATORY_TRACT | 4 refills | Status: DC

## 2016-07-09 MED ORDER — METOPROLOL SUCCINATE ER 100 MG PO TB24: 100.0000 mg | ORAL_TABLET | Freq: Every day | ORAL | 3 refills | Status: DC

## 2016-07-09 MED ORDER — TORSEMIDE 20 MG PO TABS: ORAL_TABLET | ORAL | 3 refills | Status: DC

## 2016-07-09 MED ORDER — ALLOPURINOL 100 MG PO TABS: 100.0000 mg | ORAL_TABLET | Freq: Every day | ORAL | 3 refills | Status: DC

## 2016-07-09 NOTE — Progress Notes (Signed)
Subjective:    Patient ID: Rick Gilmore, male    DOB: [birth year], 75 y.o.   MRN: [id number]  Congestive Heart Failure  Presents for follow-up visit. Pertinent negatives include no abdominal pain, chest pain, fatigue, orthopnea, palpitations or shortness of breath. The symptoms have been stable.  Other  This is a recurrent (hypotension) problem. The current episode started more than 1 month ago. The problem occurs every several days. The problem has been gradually improving. Pertinent negatives include no abdominal pain, chest pain, congestion, coughing, fatigue, headaches, neck pain or sore throat. The symptoms are aggravated by standing. He has tried position changes for the symptoms. The treatment provided significant relief.   Past Medical History:  Diagnosis Date  . AICD (automatic cardioverter/defibrillator) present   . Anemia   . Arrhythmia    a fib  . Atrial fibrillation ([place])    on Coumadin  . CAD (coronary artery disease)   . Cancer [facility]) 2001   Prostate, XRT and implant  . CHF (congestive heart failure) ([place])   . COPD (chronic obstructive pulmonary disease) ([place])   . Erectile dysfunction   . GERD (gastroesophageal reflux disease)   . Hyperlipidemia   . Hypertension   . Ischemic cardiomyopathy    s/p AICD/pacer 2009, replaced 2010 [facility]  . MI (myocardial infarction) ([place]) 1988  . Personal history of prostate cancer 2001   s/p XRT and implant ([place])  . Prostate cancer ([place])   . Sleep apnea, obstructive    uses CPAP nightly  . Thrombocytopenia ([address])   . Tubular adenoma    colon polyp  . Vitamin B 12 deficiency     Past Surgical History:  Procedure Laterality Date  . CARDIAC CATHETERIZATION    . COLONOSCOPY WITH PROPOFOL [name] 05/30/2015   Procedure: COLONOSCOPY WITH PROPOFOL;  Surgeon: Manya Silvas, MD;  Location: [facility] ENDOSCOPY;  Service: Endoscopy;  Laterality: [name];  . CORONARY ARTERY BYPASS GRAFT  1988  . ESOPHAGOGASTRODUODENOSCOPY [name] 05/30/2015    Procedure: ESOPHAGOGASTRODUODENOSCOPY (EGD);  Surgeon: Manya Silvas, MD;  Location: [facility] ENDOSCOPY;  Service: Endoscopy;  Laterality: [name];  . IMPLANTABLE CARDIOVERTER DEFIBRILLATOR GENERATOR CHANGE  April 2014   Bi V RRR  . INSERT / REPLACE / REMOVE PACEMAKER  2014  . VASECTOMY      Family History  Problem Relation Age of Onset  . Hypertension Mother   . Hypertension Father   . Cancer Father     prostate, throat  . Emphysema Father   . Heart disease Maternal Grandmother   . Heart disease Paternal Grandmother     Social History  Substance Use Topics  . Smoking status: Never Smoker  . Smokeless tobacco: Never Used  . Alcohol use 1.0 oz/week    2 Standard drinks or equivalent per week    No Known Allergies  Prior to Admission medications   Medication Sig Start Date End Date Taking? Authorizing Provider  allopurinol (ZYLOPRIM) 100 MG tablet Take 1 tablet (100 mg total) by mouth daily. 07/09/16  Yes Alisa Graff, FNP  apixaban (ELIQUIS) 5 MG TABS tablet Take 5 mg by mouth 2 (two) times daily.   Yes Historical Provider, MD  colchicine 0.6 MG tablet Take 0.6 mg by mouth daily as needed (for gout flares).    Yes Historical Provider, MD  cyanocobalamin (,VITAMIN B-12,) 1000 MCG/ML injection Inject 1,000 mcg into the muscle every 30 (thirty) days.   Yes Historical Provider, MD  escitalopram (LEXAPRO) 10 MG tablet TAKE 1 TABLET DAILY  05/31/16  Yes Crecencio Mc, MD  fluticasone (FLONASE) 50 MCG/ACT nasal spray Place 1 spray into both nostrils daily. 02/02/16 02/01/17 Yes Vishal Mungal, MD  isosorbide mononitrate (IMDUR) 30 MG 24 hr tablet Take 30 mg by mouth daily.   Yes Historical Provider, MD  KLOR-CON M20 20 MEQ tablet TAKE 1 TABLET DAILY 04/27/16  Yes Crecencio Mc, MD  lisinopril (PRINIVIL,ZESTRIL) 5 MG tablet Take 5 mg by mouth daily.   Yes Historical Provider, MD  metoprolol succinate (TOPROL-XL) 100 MG 24 hr tablet Take 1 tablet (100 mg total) by mouth daily. Take with or  immediately following a meal. 07/09/16  Yes Alisa Graff, FNP  montelukast (SINGULAIR) 10 MG tablet Take 1 tablet (10 mg total) by mouth at bedtime. 02/02/16  Yes Vishal Mungal, MD  pantoprazole (PROTONIX) 40 MG tablet Take 1 tablet (40 mg total) by mouth daily. 04/22/16  Yes Crecencio Mc, MD  torsemide (DEMADEX) 20 MG tablet Take 3 tablets in the AM and 2 tablets in the PM 07/09/16  Yes Alisa Graff, FNP  umeclidinium-vilanterol (ANORO ELLIPTA) 62.5-25 MCG/INH AEPB Inhale 1 puff into the lungs daily. 07/09/16  Yes Alisa Graff, FNP      Review of Systems  Constitutional: Negative for appetite change and fatigue.  HENT: Positive for hearing loss (wearing hearing aids). Negative for congestion, postnasal drip and sore throat.   Eyes: Negative.   Respiratory: Negative for cough, chest tightness and shortness of breath.   Cardiovascular: Negative for chest pain, palpitations and leg swelling.  Gastrointestinal: Negative for abdominal distention and abdominal pain.  Endocrine: Negative.   Genitourinary: Negative.   Musculoskeletal: Negative for back pain and neck pain.  Skin: Negative.   Allergic/Immunologic: Negative.   Neurological: Negative for dizziness, light-headedness and headaches.  Hematological: Negative for adenopathy. Bruises/bleeds easily.  Psychiatric/Behavioral: Negative for dysphoric mood and sleep disturbance (wearing CPAP). The patient is not nervous/anxious.        Objective:   Physical Exam  Constitutional: He is oriented to person, place, and time. He appears well-developed and well-nourished.  HENT:  Head: Normocephalic and atraumatic.  Eyes: Conjunctivae are normal. Pupils are equal, round, and reactive to light.  Neck: Normal range of motion. Neck supple.  Cardiovascular: Normal rate.  An irregular rhythm present.  Pulmonary/Chest: Effort normal. He has no wheezes. He has no rales.  Abdominal: Soft. He exhibits no distension. There is no tenderness.   Musculoskeletal: He exhibits no edema or tenderness.  Neurological: He is alert and oriented to person, place, and time.  Skin: Skin is warm and dry.  Psychiatric: He has a normal mood and affect. His behavior is normal. Thought content normal.  Nursing note and vitals reviewed.   BP (!) 106/51 (BP Location: Left Arm, Patient Position: Sitting)   Pulse 84   Resp 18   Ht 6\' 3"  (1.905 m)   Wt 254 lb (115.2 kg)   SpO2 97%   BMI 31.75 kg/m        Assessment & Plan:  1: Chronic heart failure with reduced ejection fraction- Patient presents without any fatigue, shortness of breath or swelling in his legs/abdomen (Class I). He continues to weigh himself daily and his home weight charts shows a gradual decrease. By our scale, he's lost 4 pounds since he was last here on 03/10/16. Reminded to call for an overnight weight gain of >2 pounds or a weekly weight gain of >5 pounds. He says that he's eating less and  is trying to be active but admits that he's not getting any formal exercise. He is not adding any salt to his food and is trying to closely follow a low sodium diet. Will not change any of his medications since he's currently not experiencing any symptoms. 2: Hypotension- Blood pressure on the low side although better than before. Only gets dizzy if he stands up too quickly. Encouraged slow position changes. 3: Chronic atrial fibrillation- Currently rate controlled at this time with metoprolol as well as apixaban. Has been out of metoprolol for about a week though. Refilled today for him. 4: Obstructive sleep apnea- Patient says that he wears his CPAP nightly and wakes up feeling refreshed.  Medication bottles were reviewed and refills that were needed were sent to [facility].  Return here in 6 months or sooner for any questions/problems before then.

## 2016-07-09 NOTE — Patient Instructions (Signed)
Continue weighing daily and call for an overnight weight gain of > 2 pounds or a weekly weight gain of >5 pounds. 

## 2016-08-20 ENCOUNTER — Ambulatory Visit
Admission: RE | Admit: 2016-08-20 | Discharge: 2016-08-20 | Disposition: A | Discharge status: Home / Self Care | Payer: Medicare Other | Source: Ambulatory Visit | Attending: Internal Medicine | Admitting: Internal Medicine

## 2016-08-20 ENCOUNTER — Encounter: Payer: Self-pay | Admitting: Internal Medicine

## 2016-08-20 ENCOUNTER — Ambulatory Visit (INDEPENDENT_AMBULATORY_CARE_PROVIDER_SITE_OTHER): Payer: Medicare Other | Admitting: Internal Medicine

## 2016-08-20 VITALS — BP 132/70 | HR 91 | Ht 75.0 in | Wt 262.2 lb

## 2016-08-20 DIAGNOSIS — J90 Pleural effusion, not elsewhere classified: Secondary | ICD-10-CM | POA: Insufficient documentation

## 2016-08-20 DIAGNOSIS — J439 Emphysema, unspecified: Secondary | ICD-10-CM

## 2016-08-20 DIAGNOSIS — I509 Heart failure, unspecified: Secondary | ICD-10-CM | POA: Diagnosis not present

## 2016-08-20 DIAGNOSIS — G4733 Obstructive sleep apnea (adult) (pediatric): Secondary | ICD-10-CM

## 2016-08-20 DIAGNOSIS — I700 Atherosclerosis of aorta: Secondary | ICD-10-CM | POA: Insufficient documentation

## 2016-08-20 DIAGNOSIS — Z951 Presence of aortocoronary bypass graft: Secondary | ICD-10-CM | POA: Diagnosis not present

## 2016-08-20 NOTE — Progress Notes (Signed)
MRN# [id number] Rick Gilmore 1941-01-03   CC: Chief Complaint  Patient presents with  . Follow-up    3 mo rov. pt had CXR today. pt states breathing is doing well. pt c/o sob w/exertion. denies any cough, wheezing, cp/tightness      Brief History: 10/29/14 Patient is a pleasant 75 year old male referred by Dr. Derrel Nip for second opinion about bilateral pleural effusion and shortness of breath. Brief history - history of ischemic cardiomyopathy, CHF NYFC III, OSA, BiV ICD, atrial fibrillation, Iron Def Anemia, Mild thrombocytopenia, prostate cancer (s\p seed implants and XRT) and obesity. He was referred by his cardiologist to Dr. Raul Del [facility]) for evaluation of dyspnea. Bilateral pleural effusions were found on chestCT, moderate sized, R > L, with R lung atelectasis. No thoracentesis was ordered or planned by Dr. Raul Del. He is now wearing supplemental oxygen at night (2L). The initiation of Imdur at night has made a considerable improvement in his dyspnea,EF is still 35% by repeat ECHO,taking Eliquis for mitigation of CVA risk. Patient has no history of chest pain, pleurisy, calf pain or cough, no hemoptysis, no syncope, he doesn't a history of initial fibrillation with a decreased ejection fraction on anticoagulation. He also has a history of ulcerative sleep apnea, on CPAP, and is compliant with his CPAP machine. Review of records showed he saw Dr. Raul Del on 09/24/2014 for shortness of breath, Dr. Raul Del was working him up for possible pulmonary hypertension due to severely elevated RVSP (70.2) on an echo in July 2015, workup would include weight loss, continue CPAP, overnight oximetry, trial of anoro ellipta/albuterol, pulmonary rehabilitation, which strict salt and fluids, possible evaluation for right heart cardiac catheterization and follow-up in February 2016.  Patient was not satisfied with the above plan outlined by Dr. Raul Del, according to patient it was not  clearly stated, and even though his shortness of breath was moderately improving, he did not understand why he was having bilateral pleural effusions. Currently he can walk about 30 yards, still has 1-2+ pitting edema, but wears compression stockings. Patient also follows with cardiology closely, [facility] (Dr. Nehemiah Massed). He denies any fevers, chills, night sweats, rapid weight loss. Plan - poor cardiac function, no thoracentesis, continue follow up with Dr. Raul Del.    Events since last clinic visit: Patient here for follow up visit for chronic bilateral pleural effusions. No worsening dyspnea since last visit, actually doing better. No cough, no fever, no chills, no sputum production. Still participates with bird watching    Medication:    Current Outpatient Prescriptions:  .  allopurinol (ZYLOPRIM) 100 MG tablet, Take 1 tablet (100 mg total) by mouth daily., Disp: 90 tablet, Rfl: 3 .  apixaban (ELIQUIS) 5 MG TABS tablet, Take 5 mg by mouth 2 (two) times daily., Disp: , Rfl:  .  colchicine 0.6 MG tablet, Take 0.6 mg by mouth daily as needed (for gout flares). , Disp: , Rfl:  .  cyanocobalamin (,VITAMIN B-12,) 1000 MCG/ML injection, Inject 1,000 mcg into the muscle every 30 (thirty) days., Disp: , Rfl:  .  escitalopram (LEXAPRO) 10 MG tablet, TAKE 1 TABLET DAILY, Disp: 90 tablet, Rfl: 1 .  fluticasone (FLONASE) 50 MCG/ACT nasal spray, Place 1 spray into both nostrils daily., Disp: 9.9 g, Rfl: 0 .  isosorbide mononitrate (IMDUR) 30 MG 24 hr tablet, Take 30 mg by mouth daily., Disp: , Rfl: 11 .  KLOR-CON M20 20 MEQ tablet, TAKE 1 TABLET DAILY, Disp: 90 tablet, Rfl: 1 .  lisinopril (PRINIVIL,ZESTRIL)  5 MG tablet, Take 5 mg by mouth daily., Disp: , Rfl:  .  metoprolol succinate (TOPROL-XL) 100 MG 24 hr tablet, Take 1 tablet (100 mg total) by mouth daily. Take with or immediately following a meal., Disp: 90 tablet, Rfl: 3 .  montelukast (SINGULAIR) 10 MG tablet, Take 1 tablet (10 mg total)  by mouth at bedtime., Disp: 90 tablet, Rfl: 3 .  pantoprazole (PROTONIX) 40 MG tablet, Take 1 tablet (40 mg total) by mouth daily., Disp: 90 tablet, Rfl: 3 .  torsemide (DEMADEX) 20 MG tablet, Take 3 tablets in the AM and 2 tablets in the PM, Disp: 450 tablet, Rfl: 3 .  umeclidinium-vilanterol (ANORO ELLIPTA) 62.5-25 MCG/INH AEPB, Inhale 1 puff into the lungs daily., Disp: 3 each, Rfl: 4    Review of Systems: Gen:  Denies  fever, sweats, chills HEENT: Denies blurred vision, double vision, ear pain, eye pain, hearing loss, nose bleeds, sore throat Cvc:  No dizziness, chest pain or heaviness Resp:   Mild cough, mild sinus congestion, mild nasal drainage. Gi: Denies swallowing difficulty, stomach pain, nausea or vomiting, diarrhea, constipation, bowel incontinence Gu:  Denies bladder incontinence, burning urine Ext:   No Joint pain, stiffness or swelling Skin: No skin rash, easy bruising or bleeding or hives Endoc:  No polyuria, polydipsia , polyphagia or weight change Other:  All other systems negative  Allergies:  Review of patient's allergies indicates no known allergies.  Physical Examination:  VS: BP 132/70 (BP Location: Left Arm, Cuff Size: Normal)   Pulse 91   Ht 6\' 3"  (1.905 m)   Wt 262 lb 3.2 oz (118.9 kg)   SpO2 95%   BMI 32.77 kg/m   General Appearance: No distress  HEENT: PERRLA, no ptosis, no other lesions noticed Pulmonary:normal breath sounds., diaphragmatic excursion normal.No wheezing, No rales   Cardiovascular:  Normal S1,S2.  No m/r/g.     Abdomen:Exam: Benign, Soft, non-tender, No masses  Skin:   warm, no rashes, no ecchymosis  Extremities: normal, no cyanosis, clubbing, warm with normal capillary refill.  Trace pedal edema  Radiology: (The following images and results were reviewed by Dr. Stevenson Clinch on 08/20/2016). FINDINGS: The lungs are well-expanded. There is a small right pleural effusion. There is right lower lobe atelectasis or early infiltrate. No left  pleural effusion. The interstitial markings are both lungs are coarse but stable. The cardiac silhouette is enlarged. The central pulmonary vascularity is prominent. The ICD is in stable position. There is calcification in the wall of the aortic arch. There are changes from previous CABG. The bony thorax exhibits no acute abnormality.  IMPRESSION: Chronic CHF. New small right pleural effusion with right basilar atelectasis or pneumonia. Followup PA and lateral chest X-ray is recommended in 3-4 weeks following trial of antibiotic therapy to ensure resolution and exclude underlying malignancy.   Assessment and Plan:74 yo Male with PMHx of dCHF, seen in follow up today for bilateral pleural effusion and COPD Sleep apnea, obstructive  OSA   Encouraged proper weight management.  Excessive weight may contribute to snoring.  Monitor sedative use.  Discussed driving precautions and its relationship with hypersomnolence.  Discussed operating dangerous equipment and its relationship with hypersomnolence.  Discussed sleep hygiene, and benefits of a fixed sleep waked time.  The importance of getting eight or more hours of sleep discussed with patient.  Discussed limiting the use of the computer and television before bedtime.  Decrease naps during the day, so night time sleep will become enhanced.  Limit  caffeine, and sleep deprivation.  HTN, stroke, and heart failure are potential risk factors.      Plan: Continue current prescription for CPAP, 12 cm of order (per the patient) disease.     Pleural effusion, bilateral Multifactorial - CHF, OSA, Cardiomyopathy, poor EF, Afib, elevated RSVP, possible PAH or pHTN  Today CXR with mild R>L pleural effusion, asymptomatic  Chronic bilateral pleural effusion.   Since his last visit he has had moderate improvement in his dyspnea, he does not feel the need to pursue a RCA, which I am in agreement with today, based on his clinical status.   At  this time the majority of his bilateral fusions related to his CHF, and they are mild. Review of his x-ray today showed minimal bilateral pleural effusions. Again, diuresis along with monitoring of input/output, and salt intake will assist in improvement of his respiratory status.  Plan - supportive care - cont with CHF regiment which is diuretics and BP management (see med list).  - diet, exercise         COPD (chronic obstructive pulmonary disease) ([place]) Patient is a never smoker, had significant secondhand smoking exposure from his parents for about 18 years. He has moderate obstruction on his recent PFTs. Dyspnea = 5, during 6 minute walk test.  Cont with  Anoro Use of this medication, proper administration, proper technique, and education discussed with the patient today   Plan: -Anoro, 62.30mcg, 1 puff daily, -gargle and rinse after each use.  -Avoid allergens -Rescue inhaler, albuterol.     Updated Medication List Outpatient Encounter Prescriptions as of 08/20/2016  Medication Sig  . allopurinol (ZYLOPRIM) 100 MG tablet Take 1 tablet (100 mg total) by mouth daily.  Marland Kitchen apixaban (ELIQUIS) 5 MG TABS tablet Take 5 mg by mouth 2 (two) times daily.  . colchicine 0.6 MG tablet Take 0.6 mg by mouth daily as needed (for gout flares).   . cyanocobalamin (,VITAMIN B-12,) 1000 MCG/ML injection Inject 1,000 mcg into the muscle every 30 (thirty) days.  Marland Kitchen escitalopram (LEXAPRO) 10 MG tablet TAKE 1 TABLET DAILY  . fluticasone (FLONASE) 50 MCG/ACT nasal spray Place 1 spray into both nostrils daily.  . isosorbide mononitrate (IMDUR) 30 MG 24 hr tablet Take 30 mg by mouth daily.  Marland Kitchen KLOR-CON M20 20 MEQ tablet TAKE 1 TABLET DAILY  . lisinopril (PRINIVIL,ZESTRIL) 5 MG tablet Take 5 mg by mouth daily.  . metoprolol succinate (TOPROL-XL) 100 MG 24 hr tablet Take 1 tablet (100 mg total) by mouth daily. Take with or immediately following a meal.  . montelukast (SINGULAIR) 10 MG tablet Take 1  tablet (10 mg total) by mouth at bedtime.  . pantoprazole (PROTONIX) 40 MG tablet Take 1 tablet (40 mg total) by mouth daily.  Marland Kitchen torsemide (DEMADEX) 20 MG tablet Take 3 tablets in the AM and 2 tablets in the PM  . umeclidinium-vilanterol (ANORO ELLIPTA) 62.5-25 MCG/INH AEPB Inhale 1 puff into the lungs daily.   No facility-administered encounter medications on file as of 08/20/2016.     Orders for this visit: No orders of the defined types were placed in this encounter.   Thank  you for the visitation and for allowing  [facility] to assist in the care of your patient. Our recommendations are noted above.  Please contact us if we can be of further service.  Vilinda Boehringer, MD [facility] Office Number: [phone]

## 2016-08-20 NOTE — Patient Instructions (Signed)
Follow up with Dr. Stevenson Clinch in:6 months -cont with BP control - cont with diet and exercise as tolerated - cont with low sodium diet.

## 2016-08-20 NOTE — Assessment & Plan Note (Signed)
Patient is a never smoker, had significant secondhand smoking exposure from his parents for about 18 years. He has moderate obstruction on his recent PFTs. Dyspnea = 5, during 6 minute walk test.  Cont with  Anoro Use of this medication, proper administration, proper technique, and education discussed with the patient today   Plan: -Anoro, 62.27mcg, 1 puff daily, -gargle and rinse after each use.  -Avoid allergens -Rescue inhaler, albuterol.

## 2016-08-20 NOTE — Assessment & Plan Note (Signed)
  OSA   Encouraged proper weight management.  Excessive weight may contribute to snoring.  Monitor sedative use.  Discussed driving precautions and its relationship with hypersomnolence.  Discussed operating dangerous equipment and its relationship with hypersomnolence.  Discussed sleep hygiene, and benefits of a fixed sleep waked time.  The importance of getting eight or more hours of sleep discussed with patient.  Discussed limiting the use of the computer and television before bedtime.  Decrease naps during the day, so night time sleep will become enhanced.  Limit caffeine, and sleep deprivation.  HTN, stroke, and heart failure are potential risk factors.      Plan: Continue current prescription for CPAP, 12 cm of order (per the patient) disease.

## 2016-08-20 NOTE — Assessment & Plan Note (Signed)
Multifactorial - CHF, OSA, Cardiomyopathy, poor EF, Afib, elevated RSVP, possible PAH or pHTN  Today CXR with mild R>L pleural effusion, asymptomatic  Chronic bilateral pleural effusion.   Since his last visit he has had moderate improvement in his dyspnea, he does not feel the need to pursue a RCA, which I am in agreement with today, based on his clinical status.   At this time the majority of his bilateral fusions related to his CHF, and they are mild. Review of his x-ray today showed minimal bilateral pleural effusions. Again, diuresis along with monitoring of input/output, and salt intake will assist in improvement of his respiratory status.  Plan - supportive care - cont with CHF regiment which is diuretics and BP management (see med list).  - diet, exercise

## 2016-08-25 DIAGNOSIS — H0289 Other specified disorders of eyelid: Secondary | ICD-10-CM | POA: Diagnosis not present

## 2016-08-30 DIAGNOSIS — I8311 Varicose veins of right lower extremity with inflammation: Secondary | ICD-10-CM | POA: Diagnosis not present

## 2016-08-30 DIAGNOSIS — D692 Other nonthrombocytopenic purpura: Secondary | ICD-10-CM | POA: Diagnosis not present

## 2016-08-30 DIAGNOSIS — L918 Other hypertrophic disorders of the skin: Secondary | ICD-10-CM | POA: Diagnosis not present

## 2016-08-30 DIAGNOSIS — L821 Other seborrheic keratosis: Secondary | ICD-10-CM | POA: Diagnosis not present

## 2016-08-30 DIAGNOSIS — Z85828 Personal history of other malignant neoplasm of skin: Secondary | ICD-10-CM | POA: Diagnosis not present

## 2016-08-30 DIAGNOSIS — L578 Other skin changes due to chronic exposure to nonionizing radiation: Secondary | ICD-10-CM | POA: Diagnosis not present

## 2016-08-30 DIAGNOSIS — D229 Melanocytic nevi, unspecified: Secondary | ICD-10-CM | POA: Diagnosis not present

## 2016-08-30 DIAGNOSIS — D485 Neoplasm of uncertain behavior of skin: Secondary | ICD-10-CM | POA: Diagnosis not present

## 2016-08-30 DIAGNOSIS — Z1283 Encounter for screening for malignant neoplasm of skin: Secondary | ICD-10-CM | POA: Diagnosis not present

## 2016-08-30 DIAGNOSIS — I8312 Varicose veins of left lower extremity with inflammation: Secondary | ICD-10-CM | POA: Diagnosis not present

## 2016-08-30 DIAGNOSIS — D1800 Hemangioma unspecified site: Secondary | ICD-10-CM | POA: Diagnosis not present

## 2016-08-30 DIAGNOSIS — L820 Inflamed seborrheic keratosis: Secondary | ICD-10-CM | POA: Diagnosis not present

## 2016-08-31 ENCOUNTER — Other Ambulatory Visit: Payer: Self-pay | Admitting: Internal Medicine

## 2016-08-31 NOTE — Telephone Encounter (Signed)
Historical medication last seen on 04/19/16. Please advise?

## 2016-09-02 DIAGNOSIS — I482 Chronic atrial fibrillation: Secondary | ICD-10-CM | POA: Diagnosis not present

## 2016-09-02 DIAGNOSIS — I5022 Chronic systolic (congestive) heart failure: Secondary | ICD-10-CM | POA: Diagnosis not present

## 2016-09-06 DIAGNOSIS — I25810 Atherosclerosis of coronary artery bypass graft(s) without angina pectoris: Secondary | ICD-10-CM | POA: Diagnosis not present

## 2016-09-06 DIAGNOSIS — I482 Chronic atrial fibrillation: Secondary | ICD-10-CM | POA: Diagnosis not present

## 2016-09-06 DIAGNOSIS — I4430 Unspecified atrioventricular block: Secondary | ICD-10-CM | POA: Diagnosis not present

## 2016-09-06 DIAGNOSIS — I5022 Chronic systolic (congestive) heart failure: Secondary | ICD-10-CM | POA: Diagnosis not present

## 2016-09-16 ENCOUNTER — Telehealth: Payer: Self-pay | Admitting: Family

## 2016-09-16 NOTE — Telephone Encounter (Signed)
Patient called yesterday stating that he had had a recent echocardiogram done and that his ejection fraction had dropped and he was wondering what could be done about it. Discussed that we could change his lisinipril to entresto and he asked if I would call his cardiologist to discuss. LM yesterday at Dr. Alveria Apley office and returned his nurse's message today. His nurse said that Dr. Nehemiah Massed did not want to switch him to entresto at this time and if he decided to do so, he would do it at the patient's next office visit.  Patient informed.

## 2016-09-19 ENCOUNTER — Emergency Department
Admission: EM | Admit: 2016-09-19 | Discharge: 2016-09-19 | Disposition: A | Discharge status: Home / Self Care | Payer: Medicare Other | Attending: Emergency Medicine | Admitting: Emergency Medicine

## 2016-09-19 DIAGNOSIS — Z95810 Presence of automatic (implantable) cardiac defibrillator: Secondary | ICD-10-CM | POA: Diagnosis not present

## 2016-09-19 DIAGNOSIS — Y999 Unspecified external cause status: Secondary | ICD-10-CM | POA: Insufficient documentation

## 2016-09-19 DIAGNOSIS — Z23 Encounter for immunization: Secondary | ICD-10-CM | POA: Insufficient documentation

## 2016-09-19 DIAGNOSIS — S61011A Laceration without foreign body of right thumb without damage to nail, initial encounter: Secondary | ICD-10-CM | POA: Diagnosis not present

## 2016-09-19 DIAGNOSIS — Z951 Presence of aortocoronary bypass graft: Secondary | ICD-10-CM | POA: Insufficient documentation

## 2016-09-19 DIAGNOSIS — Y929 Unspecified place or not applicable: Secondary | ICD-10-CM | POA: Insufficient documentation

## 2016-09-19 DIAGNOSIS — I2510 Atherosclerotic heart disease of native coronary artery without angina pectoris: Secondary | ICD-10-CM | POA: Insufficient documentation

## 2016-09-19 DIAGNOSIS — I130 Hypertensive heart and chronic kidney disease with heart failure and stage 1 through stage 4 chronic kidney disease, or unspecified chronic kidney disease: Secondary | ICD-10-CM | POA: Insufficient documentation

## 2016-09-19 DIAGNOSIS — N189 Chronic kidney disease, unspecified: Secondary | ICD-10-CM | POA: Diagnosis not present

## 2016-09-19 DIAGNOSIS — Z8546 Personal history of malignant neoplasm of prostate: Secondary | ICD-10-CM | POA: Insufficient documentation

## 2016-09-19 DIAGNOSIS — W269XXA Contact with unspecified sharp object(s), initial encounter: Secondary | ICD-10-CM | POA: Insufficient documentation

## 2016-09-19 DIAGNOSIS — J449 Chronic obstructive pulmonary disease, unspecified: Secondary | ICD-10-CM | POA: Insufficient documentation

## 2016-09-19 DIAGNOSIS — S65411A Laceration of blood vessel of right thumb, initial encounter: Secondary | ICD-10-CM | POA: Diagnosis not present

## 2016-09-19 DIAGNOSIS — Z79899 Other long term (current) drug therapy: Secondary | ICD-10-CM | POA: Insufficient documentation

## 2016-09-19 DIAGNOSIS — Y9389 Activity, other specified: Secondary | ICD-10-CM | POA: Diagnosis not present

## 2016-09-19 DIAGNOSIS — R58 Hemorrhage, not elsewhere classified: Secondary | ICD-10-CM

## 2016-09-19 DIAGNOSIS — I509 Heart failure, unspecified: Secondary | ICD-10-CM | POA: Diagnosis not present

## 2016-09-19 MED ORDER — TETANUS-DIPHTH-ACELL PERTUSSIS 5-2.5-18.5 LF-MCG/0.5 IM SUSP
0.5000 mL | Freq: Once | INTRAMUSCULAR | Status: AC
  Administered 2016-09-19: 0.5 mL via INTRAMUSCULAR
  Filled 2016-09-19: qty 0.5

## 2016-09-19 MED ORDER — LIDOCAINE-EPINEPHRINE (PF) 1 %-1:200000 IJ SOLN
INTRAMUSCULAR | Status: AC
  Administered 2016-09-19: 30 mL
  Filled 2016-09-19: qty 30

## 2016-09-19 MED ORDER — BACITRACIN-NEOMYCIN-POLYMYXIN 400-5-5000 EX OINT
TOPICAL_OINTMENT | Freq: Once | CUTANEOUS | Status: AC
  Administered 2016-09-19: 1 via TOPICAL
  Filled 2016-09-19: qty 1

## 2016-09-19 MED ORDER — LIDOCAINE-EPINEPHRINE 2 %-1:100000 IJ SOLN
20.0000 mL | Freq: Once | INTRAMUSCULAR | Status: DC
  Filled 2016-09-19: qty 20

## 2016-09-19 NOTE — ED Triage Notes (Signed)
Pt states he cut his right thumb while cutting carrots about 2 hrs ago and states he is unable to get it to stop bleeding, bleeding is controlled at present, new bandage applied.. States he on eliquist..

## 2016-09-19 NOTE — ED Provider Notes (Signed)
[facility] Emergency Department Provider Note  ____________________________________________  Time seen: Approximately 3:57 PM  I have reviewed the triage vital signs and the nursing notes.   HISTORY  Chief Complaint Laceration    HPI Rick Gilmore is a 75 y.o. male on Eliquis for A. fib presenting with bleeding laceration to the right lateral thumb after cutting carrots at noon today. The patient reports that he has been unable to control the bleeding.He denies any other injury. He denies any difficulty moving his thumb, or numbness or tingling. Last tetanus greater than 5 years ago as the patient will be updated in the emergency department.   Past Medical History:  Diagnosis Date  . AICD (automatic cardioverter/defibrillator) present   . Anemia   . Arrhythmia    a fib  . Atrial fibrillation ([place])    on Coumadin  . CAD (coronary artery disease)   . Cancer [facility]) 2001   Prostate, XRT and implant  . CHF (congestive heart failure) ([place])   . COPD (chronic obstructive pulmonary disease) ([place])   . Erectile dysfunction   . GERD (gastroesophageal reflux disease)   . Hyperlipidemia   . Hypertension   . Ischemic cardiomyopathy    s/p AICD/pacer 2009, replaced 2010 [facility]  . MI (myocardial infarction) 1988  . Personal history of prostate cancer 2001   s/p XRT and implant ([place])  . Prostate cancer ([place])   . Sleep apnea, obstructive    uses CPAP nightly  . Thrombocytopenia ([place])   . Tubular adenoma    colon polyp  . Vitamin B 12 deficiency     Patient Active Problem List   Diagnosis Date Noted  . Obesity 04/20/2016  . Hearing loss of left ear due to cerumen impaction 04/19/2016  . Hypotension 03/10/2016  . Allergic rhinitis 02/02/2016  . Chronic renal insufficiency 01/16/2016  . Hyperglycemia 01/16/2016  . 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 S/P implantation of automatic cardioverter/defibrillator (AICD) 10/21/2015  .  Diverticulosis of colon without hemorrhage 10/21/2015  . Internal hemorrhoids 10/21/2015  . Atrial fibrillation ([place]) 10/15/2015  . Tubular adenoma of colon 06/04/2015  . COPD (chronic obstructive pulmonary disease) ([place]) 04/20/2015  . Iron deficiency anemia 04/13/2015  . Thrombocytopenia ([place]) 12/01/2014  . Hyperlipidemia LDL goal <70 12/01/2014  . Pleural effusion, bilateral 10/17/2014  . Chronic systolic heart failure ([place]) 10/08/2014  . Long term current use of anticoagulant therapy 11/02/2011  . Pernicious anemia 11/02/2011  . Ischemic cardiomyopathy   . Sleep apnea, obstructive     Past Surgical History:  Procedure Laterality Date  . CARDIAC CATHETERIZATION    . COLONOSCOPY WITH PROPOFOL [name] 05/30/2015   Procedure: COLONOSCOPY WITH PROPOFOL;  Surgeon: Manya Silvas, MD;  Location: [facility] ENDOSCOPY;  Service: Endoscopy;  Laterality: [name];  . CORONARY ARTERY BYPASS GRAFT  1988  . ESOPHAGOGASTRODUODENOSCOPY [name] 05/30/2015   Procedure: ESOPHAGOGASTRODUODENOSCOPY (EGD);  Surgeon: Manya Silvas, MD;  Location[facility] ENDOSCOPY;  Service: Endoscopy;  Laterality: [name];  . IMPLANTABLE CARDIOVERTER DEFIBRILLATOR GENERATOR CHANGE  April 2014   Bi V RRR  . INSERT / REPLACE / REMOVE PACEMAKER  2014  . VASECTOMY      Current Outpatient Rx  . Order #: [id number] Class: Normal  . Order #: [id number] Class: Historical Med  . Order #: [id number] Class: Historical Med  . Order #: [id number] Class: Normal  . Order #: [id number] Class: Normal  . Order #: [id number] Class: Normal  . Order #: [id number] Class: Historical Med  . Order #: [id number] Class: Normal  .  Order #: [id number] Class: Historical Med  . Order #: [id number] Class: Normal  . Order #: [id number] Class: Normal  . Order #: [id number] Class: Normal  . Order #: [id number] Class: Normal  . Order #: [id number] Class: Normal    Allergies Review of patient's allergies indicates no known allergies.  Family History  Problem Relation Age of Onset  .  Hypertension Mother   . Hypertension Father   . Cancer Father     prostate, throat  . Emphysema Father   . Heart disease Maternal Grandmother   . Heart disease Paternal Grandmother     Social History Social History  Substance Use Topics  . Smoking status: Never Smoker  . Smokeless tobacco: Never Used  . Alcohol use 1.0 oz/week    2 Standard drinks or equivalent per week    Review of Systems Constitutional: No fever/chills.No lightheadedness or syncope. ENT:  No congestion or rhinorrhea. Cardiovascular: Denies chest pain.  Respiratory: Denies shortness of breath.  No cough. Musculoskeletal: No injury to the bone. No joint pain. Skin: Negative for rash. Positive for laceration to the lateral aspect of the right thumb with ongoing bleeding. Neurological:  No focal numbness, tingling or weakness.   10-point ROS otherwise negative.  ____________________________________________   PHYSICAL EXAM:  VITAL SIGNS: ED Triage Vitals  Enc Vitals Group     BP 09/19/16 1449 (!) 111/50     Pulse Rate 09/19/16 1449 80     Resp 09/19/16 1449 18     Temp 09/19/16 1449 98.1 F (36.7 C)     Temp Source 09/19/16 1449 Oral     SpO2 09/19/16 1449 95 %     Weight 09/19/16 1448 255 lb (115.7 kg)     Height 09/19/16 1448 6\' 3"  (1.905 m)     Head Circumference --      Peak Flow --      Pain Score 09/19/16 1449 0     Pain Loc --      Pain Edu? --      Excl. in [place]? --     Constitutional: Alert and oriented. Well appearing and in no acute distress. Answers questions appropriately. Eyes: Conjunctivae are normal.  EOMI. No scleral icterus. Head: Atraumatic. Nose: No congestion/rhinnorhea. Mouth/Throat: Mucous membranes are moist.  Neck: No stridor.  Supple.   Cardiovascular: Normal rate Respiratory: Normal respiratory effort. Musculoskeletal: 5 out of 5 strength with flexion and extension at the DIP joint of the right thumb.  Caprefill less than 2 seconds in the right thumb pad. Sensation  to light touch in the right thumb. Neurologic:  A&Ox3.  Speech is clear.  Face and smile are symmetric.  EOMI.  Moves all extremities well. Skin:  Skin is warm, dry. No rash noted. 1.5 cm laceration to the lateral aspect of the right thumb, not involving the nailbed. Psychiatric: Mood and affect are normal. Speech and behavior are normal.  Normal judgement.  ____________________________________________   LABS (all labs ordered are listed, but only abnormal results are displayed)  Labs Reviewed - No data to display ____________________________________________  EKG  Not indicated ____________________________________________  RADIOLOGY  No results found.  ____________________________________________   PROCEDURES  Procedure(s) performed: None  Procedures  Critical Care performed: No ____________________________________________   INITIAL IMPRESSION / ASSESSMENT AND PLAN / ED COURSE  Pertinent labs & imaging results that were available during my care of the patient were reviewed by me and considered in my medical decision making (see chart for details).  75 y.o. male on L [place], presenting  for ongoing bleeding in a right thumb laceration. On my examination the patient does have a laceration without any obvious tendon involvement. He does continue to have some minimal bleeding. There is no evidence of neurovascular compromise. We will update the patient's tetanus, and perform a laceration repair.  LACERATION REPAIR Performed by: Eula Listen Authorized by: Eula Listen Consent: Verbal consent obtained. Risks and benefits: risks, benefits and alternatives were discussed Consent given by: patient Patient identity confirmed: provided demographic data Prepped and Draped in normal sterile fashion Wound explored  Laceration Location: right lateral thumb  Laceration Length: 1.5cm  No Foreign Bodies seen or palpated  Anesthesia: local  infiltration  Local anesthetic: lidocaine 1% with epinephrine  Anesthetic total: <2 ml  Irrigation method: syringe Amount of cleaning: standard  Skin closure: 6-0 Prolene  Number of sutures: 4  Technique: simple interrupted  Patient tolerance: Patient tolerated the procedure well with no immediate complications. The wound was hemostatic after completion of laceration repair. The patient continued to be neurovascularly intact. We discussed return precautions as well as follow-up instructions. The wound was dressed with Neosporin, and a dressing prior to discharge.    ____________________________________________  FINAL CLINICAL IMPRESSION(S) / ED DIAGNOSES  Final diagnoses:  Laceration of blood vessel of right thumb, initial encounter  Bleeding    Clinical Course      NEW MEDICATIONS STARTED DURING THIS VISIT:  New Prescriptions   No medications on file      Eula Listen, MD 09/19/16 1605

## 2016-09-19 NOTE — ED Notes (Signed)
Wound approxiated. Dr. Mariea Clonts placed 4 sutures. No bleeding at this time. Neosporin applied.

## 2016-09-19 NOTE — ED Notes (Signed)
See triage note  States he was slicing some carrots and slipped   Laceration noted to right thumb

## 2016-09-19 NOTE — Discharge Instructions (Signed)
You may shower or wash her hands, but do not soak your hands until your sutures have been removed.  You may take Tylenol for pain.  Please apply Neosporin, or any triple antibiotic cream, and a thick coat to your right thumb over the sutures 3 times daily.  Return to the emergency department if you develop increased swelling, severe pain, numbness tingling or weakness, pus drainage, fever, nausea or vomiting, or any other symptoms concerning to you.

## 2016-09-27 ENCOUNTER — Ambulatory Visit (INDEPENDENT_AMBULATORY_CARE_PROVIDER_SITE_OTHER): Payer: Medicare Other | Admitting: Internal Medicine

## 2016-09-27 VITALS — BP 118/64 | HR 83 | Temp 97.9°F | Resp 15 | Ht 75.0 in | Wt 262.2 lb

## 2016-09-27 DIAGNOSIS — Z79899 Other long term (current) drug therapy: Secondary | ICD-10-CM | POA: Diagnosis not present

## 2016-09-27 DIAGNOSIS — Z23 Encounter for immunization: Secondary | ICD-10-CM | POA: Diagnosis not present

## 2016-09-27 DIAGNOSIS — E538 Deficiency of other specified B group vitamins: Secondary | ICD-10-CM

## 2016-09-27 DIAGNOSIS — E782 Mixed hyperlipidemia: Secondary | ICD-10-CM | POA: Diagnosis not present

## 2016-09-27 DIAGNOSIS — S61210D Laceration without foreign body of right index finger without damage to nail, subsequent encounter: Secondary | ICD-10-CM

## 2016-09-27 DIAGNOSIS — T162XXA Foreign body in left ear, initial encounter: Secondary | ICD-10-CM

## 2016-09-27 DIAGNOSIS — I5022 Chronic systolic (congestive) heart failure: Secondary | ICD-10-CM

## 2016-09-27 MED ORDER — CYANOCOBALAMIN 1000 MCG/ML IJ SOLN
1000.0000 ug | Freq: Once | INTRAMUSCULAR | Status: AC
  Administered 2016-09-27: 1000 ug via INTRAMUSCULAR

## 2016-09-27 NOTE — Progress Notes (Signed)
Subjective:  Patient ID: Rick Gilmore, male    DOB: 21-Feb-1941  Age: 75 y.o. MRN: [id number]  CC: The primary encounter diagnosis was Vitamin B 12 deficiency. Diagnoses of Encounter for immunization, Long-term use of high-risk medication, Mixed hyperlipidemia, Laceration of right index finger without foreign body without damage to nail, subsequent encounter, Foreign body in ear, left, initial encounter, and Chronic systolic heart failure ([place]) were also pertinent to this visit.  HPI Rick Gilmore presents for  ER follow up and foreign object in ear.  1) Finger laceration .  He was  treated in ER on Oct 15th  For self induced accidental laceration of his rght lateral thumb  That occurred while cutting carrots.  ER irrigated and explored his wound,  No tendon injury,  Tetanus booster given,. 4 sutures  Placed. Here for removal.  Finger has been painful to palpate but denies swelling, redness and drainage.   2) last seen here in May  . Weight was 260 lbs,  Up 2 lbs .  Has CHF,  No symptoms at rest  3) Left ear hearing aid has lost its silicone cuff and he believes it may be retained inside the ear ear canal .       Outpatient Medications Prior to Visit  Medication Sig Dispense Refill  . allopurinol (ZYLOPRIM) 100 MG tablet Take 1 tablet (100 mg total) by mouth daily. 90 tablet 3  . colchicine 0.6 MG tablet Take 0.6 mg by mouth daily as needed (for gout flares).     . cyanocobalamin (,VITAMIN B-12,) 1000 MCG/ML injection Inject 1,000 mcg into the muscle every 30 (thirty) days.    Marland Kitchen ELIQUIS 5 MG TABS tablet TAKE 1 TABLET TWICE A DAY 180 tablet 2  . escitalopram (LEXAPRO) 10 MG tablet TAKE 1 TABLET DAILY 90 tablet 1  . fluticasone (FLONASE) 50 MCG/ACT nasal spray Place 1 spray into both nostrils daily. 9.9 g 0  . isosorbide mononitrate (IMDUR) 30 MG 24 hr tablet Take 30 mg by mouth daily.  11  . KLOR-CON M20 20 MEQ tablet TAKE 1 TABLET DAILY 90 tablet 1  . lisinopril (PRINIVIL,ZESTRIL) 5 MG  tablet Take 5 mg by mouth daily.    . metoprolol succinate (TOPROL-XL) 100 MG 24 hr tablet Take 1 tablet (100 mg total) by mouth daily. Take with or immediately following a meal. 90 tablet 3  . montelukast (SINGULAIR) 10 MG tablet Take 1 tablet (10 mg total) by mouth at bedtime. 90 tablet 3  . pantoprazole (PROTONIX) 40 MG tablet Take 1 tablet (40 mg total) by mouth daily. 90 tablet 3  . torsemide (DEMADEX) 20 MG tablet Take 3 tablets in the AM and 2 tablets in the PM 450 tablet 3  . umeclidinium-vilanterol (ANORO ELLIPTA) 62.5-25 MCG/INH AEPB Inhale 1 puff into the lungs daily. 3 each 4   No facility-administered medications prior to visit.     Review of Systems;  Patient denies headache, fevers, malaise, unintentional weight loss, skin rash, eye pain, sinus congestion and sinus pain, sore throat, dysphagia,  hemoptysis , cough, dyspnea, wheezing, chest pain, palpitations, orthopnea, edema, abdominal pain, nausea, melena, diarrhea, constipation, flank pain, dysuria, hematuria, urinary  Frequency, nocturia, numbness, tingling, seizures,  Focal weakness, Loss of consciousness,  Tremor, insomnia, depression, anxiety, and suicidal ideation.      Objective:  BP 118/64   Pulse 83   Temp 97.9 F (36.6 C) (Oral)   Resp 15   Ht 6\' 3"  (1.905 m)   Wt  262 lb 3.2 oz (118.9 kg)   SpO2 93%   BMI 32.77 kg/m   BP Readings from Last 3 Encounters:  09/27/16 118/64  09/19/16 (!) 98/45  08/20/16 132/70    Wt Readings from Last 3 Encounters:  09/27/16 262 lb 3.2 oz (118.9 kg)  09/19/16 255 lb (115.7 kg)  08/20/16 262 lb 3.2 oz (118.9 kg)    General appearance: alert, cooperative and appears stated age Ears: Left calan obscured by rubber/silicone cuff.  Right side: normal TM and external ear canals both ears TBack: symmetric, no curvature. ROM normal. No CVA tenderness. Lungs: clear to auscultation bilaterally Heart: regular rate and rhythm, S1, S2 normal, no murmur, click, rub or  gallop Abdomen: soft, non-tender; bowel sounds normal; no masses,  no organomegaly Pulses: 2+ and symmetric Skin: right index finger with healing laceration on medila side of DIP.  No redness , tender to manipulatio of stitches skin color, texture, turgor normal. No rashes or lesions Lymph nodes: Cervical, supraclavicular, and axillary nodes normal.  Lab Results  Component Value Date   HGBA1C 5.1 01/16/2016   HGBA1C 5.9 11/23/2014   HGBA1C 5.9 11/21/2014    Lab Results  Component Value Date   CREATININE 1.07 04/19/2016   CREATININE 1.12 01/19/2016   CREATININE 1.10 01/18/2016    Lab Results  Component Value Date   WBC 3.9 01/19/2016   HGB 10.6 (L) 01/19/2016   HCT 31.5 (L) 01/19/2016   PLT 83 (L) 01/19/2016   GLUCOSE 109 (H) 04/19/2016   CHOL 135 10/28/2015   TRIG 74.0 10/28/2015   HDL 35.20 (L) 10/28/2015   LDLDIRECT 59.9 11/05/2011   LDLCALC 85 10/28/2015   ALT 8 04/19/2016   AST 13 04/19/2016   NA 140 04/19/2016   K 4.0 04/19/2016   CL 104 04/19/2016   CREATININE 1.07 04/19/2016   BUN 20 04/19/2016   CO2 30 04/19/2016   TSH 1.82 01/22/2015   INR 2.3 06/17/2014   HGBA1C 5.1 01/16/2016    No results found.  Assessment & Plan:   Problem List Items Addressed This Visit    Chronic systolic heart failure ([place])    Daily weights have been stable, and lung exam is normal today.  No changes to medications      Laceration of finger of right hand    stitches removed without complication or bleeding.  Advised to protect wound until complete reepithelialization has occurred  Tetanus shot updated.       Foreign body in ear, left, initial encounter    The rubber/silicone hearing aid cuff was visualllu identified and removed with forceps from ear canal.        Other Visit Diagnoses    Vitamin B 12 deficiency    -  Primary   Relevant Medications   cyanocobalamin ((VITAMIN B-12)) injection 1,000 mcg (Completed)   Other Relevant Orders   CBC with  Differential/Platelet   Encounter for immunization       Relevant Orders   Flu vaccine HIGH DOSE PF (Completed)   Long-term use of high-risk medication       Relevant Orders   Comprehensive metabolic panel   Mixed hyperlipidemia       Relevant Orders   Lipid panel     A total of 25 minutes of face to face time was spent with patient more than half of which was spent in counselling and coordination of care .  B12 injection and flu vaccine were both given.   I am having  Rick Gilmore maintain his cyanocobalamin, isosorbide mononitrate, colchicine, lisinopril, montelukast, fluticasone, pantoprazole, KLOR-CON M20, escitalopram, umeclidinium-vilanterol, allopurinol, metoprolol succinate, torsemide, and ELIQUIS. We administered cyanocobalamin.  Meds ordered this encounter  Medications  . cyanocobalamin ((VITAMIN B-12)) injection 1,000 mcg    There are no discontinued medications.  Follow-up: No Follow-up on file.   Crecencio Mc, MD

## 2016-09-27 NOTE — Progress Notes (Signed)
Pre visit review using our clinic review tool, if applicable. No additional management support is needed unless otherwise documented below in the visit note. 

## 2016-09-28 DIAGNOSIS — T162XXA Foreign body in left ear, initial encounter: Secondary | ICD-10-CM | POA: Insufficient documentation

## 2016-09-28 DIAGNOSIS — S61219A Laceration without foreign body of unspecified finger without damage to nail, initial encounter: Secondary | ICD-10-CM | POA: Insufficient documentation

## 2016-09-28 NOTE — Assessment & Plan Note (Signed)
Daily weights have been stable, and lung exam is normal today.  No changes to medications

## 2016-09-28 NOTE — Assessment & Plan Note (Signed)
The rubber/silicone hearing aid cuff was visualllu identified and removed with forceps from ear canal.

## 2016-09-28 NOTE — Assessment & Plan Note (Signed)
stitches removed without complication or bleeding.  Advised to protect wound until complete reepithelialization has occurred  Tetanus shot updated.

## 2016-10-05 ENCOUNTER — Telehealth: Payer: Self-pay | Admitting: Family

## 2016-10-05 NOTE — Telephone Encounter (Signed)
Received an email from patient stating that he is out of his klor-con that he takes as 38meq daily. He normally gets his medications from [facility] and he doesn't know if it got lost in the mail or what happened. He says that on the website it says that the auto refill can't be done until 10/24/16 which means that he won't receive the medication until around 10/30/16.  Responded back that he needs to call [facility] and explain that he never received the potassium and that they should be able to process it sooner.   In the meantime, he should eat 2 bananas daily. If they can't re-process the order, then he's to call me/email me back.

## 2016-10-07 ENCOUNTER — Other Ambulatory Visit: Payer: Self-pay | Admitting: Internal Medicine

## 2016-10-22 ENCOUNTER — Ambulatory Visit (INDEPENDENT_AMBULATORY_CARE_PROVIDER_SITE_OTHER): Payer: Medicare Other

## 2016-10-22 VITALS — BP 116/62 | HR 60 | Temp 97.1°F | Resp 14 | Ht 73.0 in | Wt 261.4 lb

## 2016-10-22 DIAGNOSIS — Z0000 Encounter for general adult medical examination without abnormal findings: Secondary | ICD-10-CM | POA: Diagnosis not present

## 2016-10-22 NOTE — Patient Instructions (Addendum)
[name] , Thank you for taking time to come for your Medicare Wellness Visit. I appreciate your ongoing commitment to your health goals. Please review the following plan we discussed and let me know if I can assist you in the future.   FOLLOW UP WITH DR. Derrel Nip AS NEEDED.  HAPPY THANKSGIVING!!  These are the goals we discussed: Goals    . Healthy Lif[place]yle          STAY HYDRATED AND DRINK PLENTY OF FLUIDS STAY ACTIVE AND GO BACK TO THE GYM 2-3 DAYS WEEKLY.  CHAIR EXERCISES AT [employer]       This is a list of the screening recommended for you and due dates:  Health Maintenance  Topic Date Due  . Colon Cancer Screening  06/02/2025  . Tetanus Vaccine  09/19/2026  . Flu Shot  Completed  . Shingles Vaccine  Completed  . Pneumonia vaccines  Completed      Fall Prevention in the Home Introduction Falls can cause injuries. They can happen to people of all ages. There are many things you can do to make your home safe and to help prevent falls. What can I do on the outside of my home?  Regularly fix the edges of walkways and driveways and fix any cracks.  Remove anything that might make you trip as you walk through a door, such as a raised step or threshold.  Trim any bushes or trees on the path to your home.  Use bright outdoor lighting.  Clear any walking paths of anything that might make someone trip, such as rocks or tools.  Regularly check to see if handrails are loose or broken. Make sure that both sides of any steps have handrails.  Any raised decks and porches should have guardrails on the edges.  Have any leaves, snow, or ice cleared regularly.  Use sand or salt on walking paths during winter.  Clean up any spills in your garage right away. This includes oil or grease spills. What can I do in the bathroom?  Use night lights.  Install grab bars by the toilet and in the tub and shower. Do not use towel bars as grab  bars.  Use non-skid mats or decals in the tub or shower.  If you need to sit down in the shower, use a plastic, non-slip stool.  Keep the floor dry. Clean up any water that spills on the floor as soon as it happens.  Remove soap buildup in the tub or shower regularly.  Attach bath mats securely with double-sided non-slip rug tape.  Do not have throw rugs and other things on the floor that can make you trip. What can I do in the bedroom?  Use night lights.  Make sure that you have a light by your bed that is easy to reach.  Do not use any sheets or blankets that are too big for your bed. They should not hang down onto the floor.  Have a firm chair that has side arms. You can use this for support while you get dressed.  Do not have throw rugs and other things on the floor that can make you trip. What can I do in the kitchen?  Clean up any spills right away.  Avoid walking on wet floors.  Keep items that you use a lot in easy-to-reach places.  If you need to reach something above you, use a strong step stool that has a  grab bar.  Keep electrical cords out of the way.  Do not use floor polish or wax that makes floors slippery. If you must use wax, use non-skid floor wax.  Do not have throw rugs and other things on the floor that can make you trip. What can I do with my stairs?  Do not leave any items on the stairs.  Make sure that there are handrails on both sides of the stairs and use them. Fix handrails that are broken or loose. Make sure that handrails are as long as the stairways.  Check any carpeting to make sure that it is firmly attached to the stairs. Fix any carpet that is loose or worn.  Avoid having throw rugs at the top or bottom of the stairs. If you do have throw rugs, attach them to the floor with carpet tape.  Make sure that you have a light switch at the top of the stairs and the bottom of the stairs. If you do not have them, ask someone to add them for  you. What else can I do to help prevent falls?  Wear shoes that:  Do not have high heels.  Have rubber bottoms.  Are comfortable and fit you well.  Are closed at the toe. Do not wear sandals.  If you use a stepladder:  Make sure that it is fully opened. Do not climb a closed stepladder.  Make sure that both sides of the stepladder are locked into place.  Ask someone to hold it for you, if possible.  Clearly mark and make sure that you can see:  Any grab bars or handrails.  First and last steps.  Where the edge of each step is.  Use tools that help you move around (mobility aids) if they are needed. These include:  Canes.  Walkers.  Scooters.  Crutches.  Turn on the lights when you go into a dark area. Replace any light bulbs as soon as they burn out.  Set up your furniture so you have a clear path. Avoid moving your furniture around.  If any of your floors are uneven, fix them.  If there are any pets around you, be aware of where they are.  Review your medicines with your doctor. Some medicines can make you feel dizzy. This can increase your chance of falling. Ask your doctor what other things that you can do to help prevent falls. This information is not intended to replace advice given to you by your health care provider. Make sure you discuss any questions you have with your health care provider. Document Released: 09/18/2009 Document Revised: 04/29/2016 Document Reviewed: 12/27/2014  2017 [employer]

## 2016-10-22 NOTE — Progress Notes (Signed)
Subjective:   Rick Gilmore is a 74 y.o. male who presents for an Initial Medicare Annual Wellness Visit.  Review of Systems  No ROS.  Medicare Wellness Visit.  Cardiac Risk Factors include: advanced age (>12men, >12 women);hypertension;male gender    Objective:    Today's Vitals   10/22/16 0903  BP: 116/62  Pulse: 60  Resp: 14  Temp: 97.1 F (36.2 C)  TempSrc: Oral  SpO2: 96%  Weight: 261 lb 6.4 oz (118.6 kg)  Height: 6\' 1"  (1.854 m)   Body mass index is 34.49 kg/m.  Current Medications (verified) Outpatient Encounter Prescriptions as of 10/22/2016  Medication Sig  . allopurinol (ZYLOPRIM) 100 MG tablet Take 1 tablet (100 mg total) by mouth daily.  . colchicine 0.6 MG tablet Take 0.6 mg by mouth daily as needed (for gout flares).   . cyanocobalamin (,VITAMIN B-12,) 1000 MCG/ML injection Inject 1,000 mcg into the muscle every 30 (thirty) days.  Marland Kitchen ELIQUIS 5 MG TABS tablet TAKE 1 TABLET TWICE A DAY  . escitalopram (LEXAPRO) 10 MG tablet TAKE 1 TABLET DAILY  . fluticasone (FLONASE) 50 MCG/ACT nasal spray Place 1 spray into both nostrils daily.  . isosorbide mononitrate (IMDUR) 30 MG 24 hr tablet Take 30 mg by mouth daily.  Marland Kitchen KLOR-CON M20 20 MEQ tablet TAKE 1 TABLET DAILY  . lisinopril (PRINIVIL,ZESTRIL) 5 MG tablet Take 5 mg by mouth daily.  . metoprolol succinate (TOPROL-XL) 100 MG 24 hr tablet Take 1 tablet (100 mg total) by mouth daily. Take with or immediately following a meal.  . montelukast (SINGULAIR) 10 MG tablet Take 1 tablet (10 mg total) by mouth at bedtime.  . pantoprazole (PROTONIX) 40 MG tablet Take 1 tablet (40 mg total) by mouth daily.  Marland Kitchen torsemide (DEMADEX) 20 MG tablet Take 3 tablets in the AM and 2 tablets in the PM  . umeclidinium-vilanterol (ANORO ELLIPTA) 62.5-25 MCG/INH AEPB Inhale 1 puff into the lungs daily.   No facility-administered encounter medications on file as of 10/22/2016.     Allergies (verified) Patient has no known allergies.    History: Past Medical History:  Diagnosis Date  . AICD (automatic cardioverter/defibrillator) present   . Anemia   . Arrhythmia    a fib  . Atrial fibrillation ([place])    on Coumadin  . CAD (coronary artery disease)   . Cancer [facility]) 2001   Prostate, XRT and implant  . CHF (congestive heart failure) ([place])   . COPD (chronic obstructive pulmonary disease) ([place])   . Erectile dysfunction   . GERD (gastroesophageal reflux disease)   . Hyperlipidemia   . Hypertension   . Ischemic cardiomyopathy    s/p AICD/pacer 2009, replaced 2010 [facility]  . MI (myocardial infarction) 1988  . Personal history of prostate cancer 2001   s/p XRT and implant ([place])  . Prostate cancer ([place])   . Sleep apnea, obstructive    uses CPAP nightly  . Thrombocytopenia ([place])   . Tubular adenoma    colon polyp  . Vitamin B 12 deficiency    Past Surgical History:  Procedure Laterality Date  . CARDIAC CATHETERIZATION    . COLONOSCOPY WITH PROPOFOL [name] 05/30/2015   Procedure: COLONOSCOPY WITH PROPOFOL;  Surgeon: Manya Silvas, MD;  [facility] ENDOSCOPY;  Service: Endoscopy;  Laterality: [name];  . CORONARY ARTERY BYPASS GRAFT  1988  . ESOPHAGOGASTRODUODENOSCOPY [name] 05/30/2015   Procedure: ESOPHAGOGASTRODUODENOSCOPY (EGD);  Surgeon: Manya Silvas, MD;  Location: [facility] ENDOSCOPY;  Service: Endoscopy;  Laterality:  [name];  . IMPLANTABLE CARDIOVERTER DEFIBRILLATOR GENERATOR CHANGE  April 2014   Bi V RRR  . INSERT / REPLACE / REMOVE PACEMAKER  2014  . VASECTOMY     Family History  Problem Relation Age of Onset  . Hypertension Mother   . Hypertension Father   . Cancer Father     prostate, throat  . Emphysema Father   . Heart disease Maternal Grandmother   . Heart disease Paternal Grandmother    Social History   Occupational History  . Not on file.   Social History Main Topics  . Smoking status: Never Smoker  . Smokeless tobacco: Never Used  . Alcohol use 2.4 oz/week    2 Standard drinks or equivalent,  2 Cans of beer per week     Comment: Mixed drinks  . Drug use: No  . Sexual activity: No   Tobacco Counseling Counseling given: Not Answered   Activities of Daily Living In your present state of health, do you have any difficulty performing the following activities: 10/22/2016 07/09/2016  Hearing? Tempie Donning  Vision? N N  Difficulty concentrating or making decisions? N N  Walking or climbing stairs? Y N  Dressing or bathing? N N  Doing errands, shopping? N N  Preparing Food and eating ? N -  Using the Toilet? N -  In the past six months, have you accidently leaked urine? N -  Do you have problems with loss of bowel control? N -  Managing your Medications? N -  Managing your Finances? N -  Housekeeping or managing your Housekeeping? N -  Some recent data might be hidden    Immunizations and Health Maintenance Immunization History  Administered Date(s) Administered  . Influenza Split 09/14/2011, 08/31/2012  . Influenza, High Dose Seasonal PF 09/27/2016  . Influenza,inj,Quad PF,36+ Mos 09/05/2013, 08/08/2014, 09/02/2015  . Pneumococcal Conjugate-13 06/01/2011  . Pneumococcal Polysaccharide-23 07/13/2013  . Tdap 11/03/2011, 09/19/2016  . Zoster 10/06/2011   There are no preventive care reminders to display for this patient.  Patient Care Team: Crecencio Mc, MD as PCP - General (Internal Medicine) Manya Silvas, MD (Gastroenterology) Corey Skains, MD as Consulting Physician (Cardiology) Alisa Graff, FNP as Nurse Practitioner (Family Medicine) Vilinda Boehringer, MD as Consulting Physician (Pulmonary Disease)  Indicate any recent Medical Services you may have received from other than [facility] providers in the past year (date may be approximate).    Assessment:   This is a routine wellness examination for Rick Gilmore. The goal of the wellness visit is to assist the patient how to close the gaps in care and create a preventative care plan for the patient.   Osteoporosis risk  reviewed.  Medications reviewed; taking without issues or barriers.  Safety issues reviewed; lives alone.  Smoke detectors in the home. Firearms locked in a safe within the home. Wears seatbelts when driving or riding with others. No violence in the home.  No identified risk were noted; The patient was oriented x 3; appropriate in dress and manner and no objective failures at ADL's or IADL's.   BMI; discussed the importance of a healthy diet, water intake and exercise. Educational material provided.  Health maintenance gaps; closed.  Patient Concerns: None at this time. Follow up with PCP as needed.  Hearing/Vision screen Hearing Screening Comments: Followed by K[facility]) Hearing aids Vision Screening Comments: Followed by [facility] Last OV 08/2016 Bilateral cataracts extracted  Dietary issues and exercise activities discussed: Current Exercise Habits: The  patient does not participate in regular exercise at present, Intensity: Mild  Goals    . Healthy Lifetsyle          STAY HYDRATED AND DRINK PLENTY OF FLUIDS STAY ACTIVE AND GO BACK TO THE GYM 2-3 DAYS WEEKLY.  CHAIR EXERCISES AT [employer], LEAN MEATS      Depression Screen PHQ 2/9 Scores 10/22/2016 07/09/2016 03/10/2016 01/28/2016  PHQ - 2 Score 0 0 0 0    Fall Risk Fall Risk  10/22/2016 07/09/2016 03/10/2016 01/28/2016 01/08/2016  Falls in the past year? Yes No No No No  Number falls in past yr: 2 or more - - - -  Injury with Fall? - - - - -  Risk Factor Category  - - - - -  Risk for fall due to : - History of fall(s) - - -  Follow up Falls prevention discussed;Education provided - - - -    Cognitive Function: MMSE - Mini Mental State Exam 10/22/2016  Orientation to time 5  Orientation to Place 5  Registration 3  Attention/ Calculation 5  Recall 3  Language- name 2 objects 2  Language- repeat 1  Language- follow 3 step command 3  Language- read & follow direction 1    Write a sentence 1  Copy design 1  Total score 30        Screening Tests Health Maintenance  Topic Date Due  . COLONOSCOPY  06/02/2025  . TETANUS/TDAP  09/19/2026  . INFLUENZA VACCINE  Completed  . ZOSTAVAX  Completed  . PNA vac Low Risk Adult  Completed        Plan:    End of life planning; Advance aging; Advanced directives discussed. Copy of current HCPOA/Living Will requested.  Medicare Attestation I have personally reviewed: The patient's medical and social history Their use of alcohol, tobacco or illicit drugs Their current medications and supplements The patient's functional ability including ADLs,fall risks, home safety risks, cognitive, and hearing and visual impairment Diet and physical activities Evidence for depression   The patient's weight, height, BMI, and visual acuity have been recorded in the chart.  I have made referrals and provided education to the patient based on review of the above and I have provided the patient with a written personalized care plan for preventive services.    During the course of the visit Rick Gilmore was educated and counseled about the following appropriate screening and preventive services:   Vaccines to include Pneumoccal, Influenza, Hepatitis B, Td, Zostavax, HCV  Electrocardiogram  Colorectal cancer screening  Cardiovascular disease screening  Diabetes screening  Glaucoma screening  Nutrition counseling  Prostate cancer screening  Smoking cessation counseling  Patient Instructions (the written plan) were given to the patient.   Varney Biles, LPN   [id number]

## 2016-10-24 NOTE — Progress Notes (Signed)
  I have reviewed the above information and agree with above.   Giovani Neumeister, MD 

## 2016-10-24 NOTE — Progress Notes (Signed)
  I have reviewed the above information and agree with above.   Israel Wunder, MD 

## 2016-11-18 ENCOUNTER — Telehealth: Payer: Self-pay | Admitting: Internal Medicine

## 2016-11-18 NOTE — Telephone Encounter (Signed)
Pt lvm on my ext needing to speak to [place]. That is all he said. cb [phone]

## 2016-11-19 NOTE — Telephone Encounter (Signed)
Called the number listed below.  No answer. No voice mail.

## 2016-11-27 ENCOUNTER — Other Ambulatory Visit: Payer: Self-pay | Admitting: Internal Medicine

## 2016-12-09 DIAGNOSIS — I482 Chronic atrial fibrillation: Secondary | ICD-10-CM | POA: Diagnosis not present

## 2016-12-20 DIAGNOSIS — T162XXA Foreign body in left ear, initial encounter: Secondary | ICD-10-CM | POA: Diagnosis not present

## 2017-01-11 ENCOUNTER — Ambulatory Visit: Payer: Medicare Other | Attending: Family | Admitting: Family

## 2017-01-11 ENCOUNTER — Encounter: Payer: Self-pay | Admitting: Family

## 2017-01-11 VITALS — BP 106/50 | HR 80 | Resp 18 | Ht 75.0 in | Wt 266.1 lb

## 2017-01-11 DIAGNOSIS — J029 Acute pharyngitis, unspecified: Secondary | ICD-10-CM | POA: Diagnosis not present

## 2017-01-11 DIAGNOSIS — Z8249 Family history of ischemic heart disease and other diseases of the circulatory system: Secondary | ICD-10-CM | POA: Diagnosis not present

## 2017-01-11 DIAGNOSIS — Z800 Family history of malignant neoplasm of digestive organs: Secondary | ICD-10-CM | POA: Diagnosis not present

## 2017-01-11 DIAGNOSIS — J309 Allergic rhinitis, unspecified: Secondary | ICD-10-CM | POA: Insufficient documentation

## 2017-01-11 DIAGNOSIS — D696 Thrombocytopenia, unspecified: Secondary | ICD-10-CM | POA: Diagnosis not present

## 2017-01-11 DIAGNOSIS — Z95810 Presence of automatic (implantable) cardiac defibrillator: Secondary | ICD-10-CM | POA: Diagnosis not present

## 2017-01-11 DIAGNOSIS — K219 Gastro-esophageal reflux disease without esophagitis: Secondary | ICD-10-CM | POA: Insufficient documentation

## 2017-01-11 DIAGNOSIS — I4891 Unspecified atrial fibrillation: Secondary | ICD-10-CM | POA: Insufficient documentation

## 2017-01-11 DIAGNOSIS — I110 Hypertensive heart disease with heart failure: Secondary | ICD-10-CM | POA: Insufficient documentation

## 2017-01-11 DIAGNOSIS — I482 Chronic atrial fibrillation, unspecified: Secondary | ICD-10-CM

## 2017-01-11 DIAGNOSIS — Z79899 Other long term (current) drug therapy: Secondary | ICD-10-CM | POA: Insufficient documentation

## 2017-01-11 DIAGNOSIS — E785 Hyperlipidemia, unspecified: Secondary | ICD-10-CM | POA: Insufficient documentation

## 2017-01-11 DIAGNOSIS — D649 Anemia, unspecified: Secondary | ICD-10-CM | POA: Diagnosis not present

## 2017-01-11 DIAGNOSIS — I255 Ischemic cardiomyopathy: Secondary | ICD-10-CM | POA: Insufficient documentation

## 2017-01-11 DIAGNOSIS — R42 Dizziness and giddiness: Secondary | ICD-10-CM | POA: Insufficient documentation

## 2017-01-11 DIAGNOSIS — I5022 Chronic systolic (congestive) heart failure: Secondary | ICD-10-CM

## 2017-01-11 DIAGNOSIS — G4733 Obstructive sleep apnea (adult) (pediatric): Secondary | ICD-10-CM | POA: Diagnosis not present

## 2017-01-11 DIAGNOSIS — R05 Cough: Secondary | ICD-10-CM | POA: Diagnosis not present

## 2017-01-11 DIAGNOSIS — I252 Old myocardial infarction: Secondary | ICD-10-CM | POA: Insufficient documentation

## 2017-01-11 DIAGNOSIS — Z8546 Personal history of malignant neoplasm of prostate: Secondary | ICD-10-CM | POA: Insufficient documentation

## 2017-01-11 DIAGNOSIS — J449 Chronic obstructive pulmonary disease, unspecified: Secondary | ICD-10-CM | POA: Diagnosis not present

## 2017-01-11 DIAGNOSIS — Z8042 Family history of malignant neoplasm of prostate: Secondary | ICD-10-CM | POA: Diagnosis not present

## 2017-01-11 DIAGNOSIS — Z9889 Other specified postprocedural states: Secondary | ICD-10-CM | POA: Diagnosis not present

## 2017-01-11 DIAGNOSIS — I950 Idiopathic hypotension: Secondary | ICD-10-CM

## 2017-01-11 DIAGNOSIS — I959 Hypotension, unspecified: Secondary | ICD-10-CM | POA: Diagnosis not present

## 2017-01-11 DIAGNOSIS — I2510 Atherosclerotic heart disease of native coronary artery without angina pectoris: Secondary | ICD-10-CM | POA: Diagnosis not present

## 2017-01-11 DIAGNOSIS — R0989 Other specified symptoms and signs involving the circulatory and respiratory systems: Secondary | ICD-10-CM | POA: Insufficient documentation

## 2017-01-11 DIAGNOSIS — J301 Allergic rhinitis due to pollen: Secondary | ICD-10-CM

## 2017-01-11 NOTE — Patient Instructions (Addendum)
Continue weighing daily and call for an overnight weight gain of > 2 pounds or a weekly weight gain of >5 pounds.  For cold symptoms you can take:   - head congestion  Sudafed for 2-3 days only - cough  mucinex D to loosen the cough  Delsym or robitussin for the cough - fever/pain  Tylenol - nausea  Dramamine - diarrhea  Imodium

## 2017-01-11 NOTE — Progress Notes (Signed)
Patient ID: Rick Gilmore, male    DOB: Feb 25, 1941, 76 y.o.   MRN: [id number]  HPI  Rick Gilmore is a 76 y/o male with a history of thrombocytopenia, obstructive sleep apnea (with CPAP), prostate cancer, MI, HTN, hyperlipidemia, GERD, COPD, atrial fibrillation, anemia, AICD and chronic heart failure.   Last echo was done 09/02/16 and showed an EF of 25% along with severe Rick/TR. EF has decreased from 01/16/16 which showed an EF of 30-35% along with mild Rick/TR.   Was in the ED 09/19/16 due to laceration of right lateral thumb after cutting carrots. Due to treatment with eliquis, he was unable to control the bleeding. 4 sutures were placed and he was discharged home. Last admission was 01/16/16 for exacerbation of his heart failure due to dietary noncompliance. Was treated with diuretics, nebulizers and steroids. Discharged home after 3 days.   He presents today for his follow-up visit without any fatigue or shortness of breath. Slight swelling in his ankles as he's recently returned from the [employer] and ate foods that were saltier and didn't take as much of his diuretic as he normally takes. Home now and back on his routine. Weight gradually creeping up but no overnight weight gains. Having some cold type symptoms but no fever.   Past Medical History:  Diagnosis Date  . AICD (automatic cardioverter/defibrillator) present   . Anemia   . Arrhythmia    a fib  . Atrial fibrillation ([place])    on Coumadin  . CAD (coronary artery disease)   . Cancer [facility]) 2001   Prostate, XRT and implant  . CHF (congestive heart failure) ([place])   . COPD (chronic obstructive pulmonary disease) ([place])   . Erectile dysfunction   . GERD (gastroesophageal reflux disease)   . Hyperlipidemia   . Hypertension   . Ischemic cardiomyopathy    s/p AICD/pacer 2009, replaced 2010 [facility]  . MI (myocardial infarction) 1988  . Personal history of prostate cancer 2001   s/p XRT and implant ([place])  . Prostate cancer ([place])   . Sleep  apnea, obstructive    uses CPAP nightly  . Thrombocytopenia ([place])   . Tubular adenoma    colon polyp  . Vitamin B 12 deficiency    Past Surgical History:  Procedure Laterality Date  . CARDIAC CATHETERIZATION    . COLONOSCOPY WITH PROPOFOL [name] 05/30/2015   Procedure: COLONOSCOPY WITH PROPOFOL;  Surgeon: Manya Silvas, MD;  [facility] END[facility]COPY;  Service: Endoscopy;  Laterality: [name];  . CORONARY ARTERY BYPASS GRAFT  1988  . ESOPHAGOGASTRODUODENOSCOPY [name] 05/30/2015   Procedure: ESOPHAGOGASTRODUODENOSCOPY (EGD);  Surgeon: Manya Silvas, MD;  Location: [facility] ENDOSCOPY;  Service: Endoscopy;  Laterality: [name];  . IMPLANTABLE CARDIOVERTER DEFIBRILLATOR GENERATOR CHANGE  April 2014   Bi V RRR  . INSERT / REPLACE / REMOVE PACEMAKER  2014  . VASECTOMY     Family History  Problem Relation Age of Onset  . Hypertension Mother   . Hypertension Father   . Cancer Father     prostate, throat  . Emphysema Father   . Heart disease Maternal Grandmother   . Heart disease Paternal Grandmother    Social History  Substance Use Topics  . Smoking status: Never Smoker  . Smokeless tobacco: Never Used  . Alcohol use 2.4 oz/week    2 Standard drinks or equivalent, 2 Cans of beer per week     Comment: Mixed drinks   No Known Allergies  Prior to Admission medications  Medication Sig Start Date End Date Taking? Authorizing Provider  allopurinol (ZYLOPRIM) 100 MG tablet Take 1 tablet (100 mg total) by mouth daily. 07/09/16  Yes Alisa Graff, FNP  colchicine 0.6 MG tablet Take 0.6 mg by mouth daily as needed (for gout flares).    Yes Historical Provider, MD  cyanocobalamin (,VITAMIN B-12,) 1000 MCG/ML injection Inject 1,000 mcg into the muscle every 30 (thirty) days.   Yes Historical Provider, MD  ELIQUIS 5 MG TABS tablet TAKE 1 TABLET TWICE A DAY 08/31/16  Yes Crecencio Mc, MD  escitalopram (LEXAPRO) 10 MG tablet TAKE 1 TABLET DAILY 11/30/16  Yes Crecencio Mc, MD  fluticasone (FLONASE) 50  MCG/ACT nasal spray Place 1 spray into both nostrils daily. 02/02/16 02/01/17 Yes Vishal Mungal, MD  isosorbide mononitrate (IMDUR) 30 MG 24 hr tablet Take 30 mg by mouth daily.   Yes Historical Provider, MD  KLOR-CON M20 20 MEQ tablet TAKE 1 TABLET DAILY 10/08/16  Yes Crecencio Mc, MD  lisinopril (PRINIVIL,ZESTRIL) 5 MG tablet Take 5 mg by mouth daily.   Yes Historical Provider, MD  metoprolol succinate (TOPROL-XL) 100 MG 24 hr tablet Take 1 tablet (100 mg total) by mouth daily. Take with or immediately following a meal. 07/09/16  Yes Alisa Graff, FNP  montelukast (SINGULAIR) 10 MG tablet Take 1 tablet (10 mg total) by mouth at bedtime. 02/02/16  Yes Vishal Mungal, MD  pantoprazole (PROTONIX) 40 MG tablet Take 1 tablet (40 mg total) by mouth daily. 04/22/16  Yes Crecencio Mc, MD  torsemide (DEMADEX) 20 MG tablet Take 3 tablets in the AM and 2 tablets in the PM 07/09/16  Yes Alisa Graff, FNP  umeclidinium-vilanterol (ANORO ELLIPTA) 62.5-25 MCG/INH AEPB Inhale 1 puff into the lungs daily. 07/09/16  Yes Alisa Graff, FNP     Review of Systems  Constitutional: Negative for appetite change, fatigue and fever.  HENT: Positive for congestion and sore throat. Negative for ear pain, postnasal drip and sinus pressure.   Eyes: Negative.   Respiratory: Positive for cough (dry cough). Negative for chest tightness and shortness of breath.   Cardiovascular: Positive for leg swelling (minimal at sock line). Negative for chest pain and palpitations.  Gastrointestinal: Negative for abdominal distention and abdominal pain.  Endocrine: Negative.   Genitourinary: Negative.   Musculoskeletal: Negative for back pain and neck pain.  Skin: Negative.   Allergic/Immunologic: Negative.   Neurological: Positive for light-headedness (when change positions too quickly). Negative for dizziness.  Hematological: Negative for adenopathy. Bruises/bleeds easily.  Psychiatric/Behavioral: Negative for dysphoric mood, sleep  disturbance (wearing CPAP at night. sleeping on 1 pillow) and suicidal ideas. The patient is not nervous/anxious.    Vitals:   01/11/17 0908  BP: (!) 106/50  Pulse: 80  Resp: 18  SpO2: 97%  Weight: 266 lb 2 oz (120.7 kg)  Height: 6\' 3"  (1.905 m)   Wt Readings from Last 3 Encounters:  01/11/17 266 lb 2 oz (120.7 kg)  10/22/16 261 lb 6.4 oz (118.6 kg)  09/27/16 262 lb 3.2 oz (118.9 kg)   Lab Results  Component Value Date   CREATININE 1.07 04/19/2016   CREATININE 1.12 01/19/2016   CREATININE 1.10 01/18/2016   Physical Exam  Constitutional: He is oriented to person, place, and time. He appears well-developed and well-nourished.  HENT:  Head: Normocephalic and atraumatic.  Eyes: Conjunctivae are normal. Pupils are equal, round, and reactive to light.  Neck: Normal range of motion. Neck supple. No JVD  present.  Cardiovascular: Normal rate.  An irregular rhythm present.  Pulmonary/Chest: Effort normal. He has no wheezes. He has no rales.  Abdominal: Soft. He exhibits no distension. There is no tenderness.  Musculoskeletal: He exhibits edema (trace edema around bilateral ankles). He exhibits no tenderness.  Neurological: He is alert and oriented to person, place, and time.  Skin: Skin is warm and dry.  Psychiatric: He has a normal mood and affect. His behavior is normal. Thought content normal.  Nursing note and vitals reviewed.  Assessment & Plan:  1: Chronic heart failure with reduced ejection fraction- - NYHA class I - euvolemic today - weight up 12 pounds since he was last here August 2017. Patient says that it's been a gradual increase and feels like it's because he's been travelling and eating foods that he shouldn't be eating. Also recently didn't take as much diuretic as normally because of the extensive walking that he recently did on his trip. Is back on his normal regimen of diuretic now.  - will not change his lisinopril to entresto at this time as patient is not having  any symptoms - received his flu vaccine for this season - says that he's due to see his cardiologist Nehemiah Massed) next week  2: Hypotension- - BP looks good today - does endorse lightheadedness with quick position changes - may be difficult to titrate up toprol due to hypotension  3: Atrial fibrillation- - currently rate controlled  - on eliquis and toprol XL  4: Obstructive sleep apnea- - wearing CPAP on a nightly basis - reports sleeping well  5: Allergic rhinitis- - slight cough, sore throat, runny nose since returning from [employer] trip & he says that he was outside alot - list of OTC medications that he could take for various symptoms if they develop written down for him - should symptoms worsen, he is to contact his PCP  Patient did not bring his medications nor a list. Each medication was verbally reviewed with the patient and he was encouraged to bring the bottles to every visit to confirm accuracy of list.  Return in 6 months or sooner for any questions/problems before then.

## 2017-01-12 ENCOUNTER — Telehealth: Payer: Self-pay

## 2017-01-12 NOTE — Telephone Encounter (Signed)
Mr. Rendleman called to ask about instructions provided for cold management. His instructions say to use Mucinex D and he was wanting to know if he could use Mucinex DM that he has. I spoke with pharmacist in clinic and she advised that he could use the Mucinex DM that he has on hand but not to use the Delsym for cough as that is included in the Mucinex. Pt verbalized understanding and will call back if he has any other concerns.

## 2017-01-13 ENCOUNTER — Other Ambulatory Visit: Payer: Self-pay

## 2017-01-13 ENCOUNTER — Other Ambulatory Visit: Payer: Self-pay | Admitting: Internal Medicine

## 2017-01-17 DIAGNOSIS — I482 Chronic atrial fibrillation: Secondary | ICD-10-CM | POA: Diagnosis not present

## 2017-01-17 DIAGNOSIS — G4733 Obstructive sleep apnea (adult) (pediatric): Secondary | ICD-10-CM | POA: Diagnosis not present

## 2017-01-17 DIAGNOSIS — I255 Ischemic cardiomyopathy: Secondary | ICD-10-CM | POA: Diagnosis not present

## 2017-01-17 DIAGNOSIS — I25810 Atherosclerosis of coronary artery bypass graft(s) without angina pectoris: Secondary | ICD-10-CM | POA: Diagnosis not present

## 2017-02-07 ENCOUNTER — Encounter: Payer: Self-pay | Admitting: Podiatry

## 2017-02-07 ENCOUNTER — Ambulatory Visit (INDEPENDENT_AMBULATORY_CARE_PROVIDER_SITE_OTHER): Payer: Medicare Other | Admitting: Podiatry

## 2017-02-07 DIAGNOSIS — M79676 Pain in unspecified toe(s): Secondary | ICD-10-CM | POA: Diagnosis not present

## 2017-02-07 DIAGNOSIS — B351 Tinea unguium: Secondary | ICD-10-CM | POA: Diagnosis not present

## 2017-02-07 DIAGNOSIS — Q828 Other specified congenital malformations of skin: Secondary | ICD-10-CM | POA: Diagnosis not present

## 2017-02-07 NOTE — Progress Notes (Signed)
This patient presents the office with chief complaint of long thick nails and a painful callus on the bottom of the right foot. He states the nails are painful walking and wearing his shoes. He is unable to self treat. He also describes a very painful lesion on the bottom of his right forefoot which is painful when he walks presents the office today for an evaluation and treatment of his nails and callus. He has a history of gout  GENERAL APPEARANCE: Alert, conversant. Appropriately groomed. No acute distress.  VASCULAR: Pedal pulses are  palpable at  [facility] and PT bilateral.  Capillary refill time is immediate to all digits,  Normal temperature gradient.  Digital hair growth is present bilateral  NEUROLOGIC: sensation is normal to 5.07 monofilament at 5/5 sites bilateral.  Light touch is intact bilateral, Muscle strength normal.  MUSCULOSKELETAL: acceptable muscle strength, tone and stability bilateral.  Intrinsic muscluature intact bilateral.  Rectus appearance of foot .  Severe hammer toes 1-5  B/L   DERMATOLOGIC: skin color, texture, and turgor are within normal limits.  No preulcerative lesions or ulcers  are seen, no interdigital maceration noted.  No open lesions present.  . No drainage noted. Porokeratosis/Wart, sub-4 of the right foot  NAILS  thick disfigured discolored hallux toenails, both feet   onychomycosis hallux nails  Porokeratosis right foot  Debridement of nails. Debridement of porokeratosis turned to the office in 3 months for further evaluation and treatment  Gardiner Barefoot DPM

## 2017-03-15 ENCOUNTER — Institutional Professional Consult (permissible substitution): Payer: Medicare Other | Admitting: Pulmonary Disease

## 2017-03-31 DIAGNOSIS — I482 Chronic atrial fibrillation: Secondary | ICD-10-CM | POA: Diagnosis not present

## 2017-04-01 ENCOUNTER — Other Ambulatory Visit: Payer: Self-pay | Admitting: Internal Medicine

## 2017-04-06 ENCOUNTER — Other Ambulatory Visit: Payer: Self-pay | Admitting: Internal Medicine

## 2017-05-05 ENCOUNTER — Encounter: Payer: Self-pay | Admitting: Pulmonary Disease

## 2017-05-05 ENCOUNTER — Ambulatory Visit
Admission: RE | Admit: 2017-05-05 | Discharge: 2017-05-05 | Disposition: A | Discharge status: Home / Self Care | Payer: Medicare Other | Source: Ambulatory Visit | Attending: Pulmonary Disease | Admitting: Pulmonary Disease

## 2017-05-05 ENCOUNTER — Ambulatory Visit (INDEPENDENT_AMBULATORY_CARE_PROVIDER_SITE_OTHER): Payer: Medicare Other | Admitting: Pulmonary Disease

## 2017-05-05 VITALS — BP 128/70 | HR 83 | Resp 16 | Ht 75.0 in | Wt 264.8 lb

## 2017-05-05 DIAGNOSIS — I255 Ischemic cardiomyopathy: Secondary | ICD-10-CM | POA: Diagnosis not present

## 2017-05-05 DIAGNOSIS — R0609 Other forms of dyspnea: Secondary | ICD-10-CM | POA: Diagnosis not present

## 2017-05-05 DIAGNOSIS — J449 Chronic obstructive pulmonary disease, unspecified: Secondary | ICD-10-CM | POA: Diagnosis not present

## 2017-05-05 DIAGNOSIS — I2589 Other forms of chronic ischemic heart disease: Secondary | ICD-10-CM

## 2017-05-05 DIAGNOSIS — I700 Atherosclerosis of aorta: Secondary | ICD-10-CM | POA: Insufficient documentation

## 2017-05-05 DIAGNOSIS — G4733 Obstructive sleep apnea (adult) (pediatric): Secondary | ICD-10-CM | POA: Diagnosis not present

## 2017-05-05 DIAGNOSIS — R0602 Shortness of breath: Secondary | ICD-10-CM | POA: Diagnosis not present

## 2017-05-05 DIAGNOSIS — R918 Other nonspecific abnormal finding of lung field: Secondary | ICD-10-CM | POA: Insufficient documentation

## 2017-05-05 MED ORDER — FLUTICASONE-UMECLIDIN-VILANT 100-62.5-25 MCG/INH IN AEPB: 1.0000 | INHALATION_SPRAY | Freq: Every day | RESPIRATORY_TRACT | 0 refills | Status: AC

## 2017-05-05 NOTE — Patient Instructions (Addendum)
Lung function test today - office spirometry.  Chest Xray today - we will call you with the results Repeat CPAP titration study Continue Anoro inhaler  Follow up in 4-6 weeks

## 2017-05-06 NOTE — Progress Notes (Signed)
PULMONARY OFFICE FOLLOW UP NOTE  PROBLEMS:  COPD OSA Ischemic cardiomyopathy Chronic AF  DATA: Sleep studies (2011): Moderate obstructive sleep apnea. Recommended CPAP 12 cm H2O CT chest (10/07/14): Findings consistent with congestive heart failure and pulmonary edema PFTs (01/29/16): FVC: 3.38 > 3.92 L (68 > 79 %pred), FEV1: 2.29 >2.89 L (63 > 80 %pred), FEV1/FVC:68 >74% , TLC:  6.60 L (84  %pred), DLCO 51% pred Spirometry (05/05/17): 2.64 FVC:  L (50 to %pred), FEV1: 1.72 L (46 %pred), FEV1/FVC: 65% CPAP compliance 05/01-05/30/18: CPAP worn 30/30 nights, greater than 4 hours 30/30 nights. AHI greater than 10/hr 10 out of 30 nights  INTERVAL HISTORY: Former patient of [facility], last seen 09/15. No major events  SUBJ: Routine re-eval for OSA, COPD. He is compliant with CPAP. Current setting 12 cm H2O. His CC is class III DOE. Denies CP, orthopnea, PND, fever, purulent sputum, hemoptysis, increasing LE edema and calf tenderness.   OBJ: Vitals:   05/05/17 1057  BP: 128/70  Pulse: 83  Resp: 16  SpO2: (!) 89%  Weight: 264 lb 12.8 oz (120.1 kg)  Height: 6\' 3"  (1.905 m)  RA  Gen: NAD HEENT: NCAT, sclerae white, oropharynx normal Neck: No LAN, no JVD noted Lungs: BS diminished in R base, dull to percussion 1/4 up on R, no wheezes Cardiovascular: Reg rate, normal rhythm, no M noted Abdomen: Soft, NT, +BS Ext: no C/C/E. Chronic stasis changes present Neuro: PERRL, EOMI, motor/sensory grossly intact Skin: No lesions noted   DATA: Spirometry (05/05/17): 2.64 FVC:  L (50 to %pred), FEV1: 1.72 L (46 %pred), FEV1/FVC: 65%  Chest x-ray (05/05/17): Vascular redistribution, interstitial prominence, small right pleural effusion. All consistent with heart failure  CPAP compliance results as above IMPRESSION: OSA - appears to be inadequately controlled on CPAP 12 cm H2O  Documented history of COPD, never smoker  Mixed pattern of obstruction and restriction on spirometry  Ischemic  cardiomyopathy with chronic AF  DOE - increasing. Based on CXR findings, suspect predominantly cardiac etiology   PLAN: Spirometry and CXR ordered on this visit and reviewed as above Continue Anoro inhaler Repeat CPAP titration - suspect CPAP pressure will have to be increased Will communicate with Dr Nehemiah Massed Follow up in 4-6 weeks   Merton Border, MD PCCM service Mobile [phone] Pager [phone] 05/06/2017.

## 2017-05-12 ENCOUNTER — Telehealth: Payer: Self-pay | Admitting: Pulmonary Disease

## 2017-05-12 MED ORDER — FLUTICASONE PROPIONATE 50 MCG/ACT NA SUSP: 1.0000 | Freq: Every day | NASAL | 3 refills | Status: DC

## 2017-05-12 NOTE — Telephone Encounter (Signed)
Pt calling asking about his tests he did last week States he did a breathing test and an xray  Please call back with results

## 2017-05-12 NOTE — Telephone Encounter (Signed)
Patient brought in letter for Dr. Alva Garnet Requesting refill for :   *STAT* If patient is at the pharmacy, call can be transferred to refill team.   1. Which medications need to be refilled? (please list name of each medication and dose if known) fluticasone 50 mcg /act 1 spray each nostril daily   2. Which pharmacy/location (including street and city if local pharmacy) is medication to be sent to? [facility]   3. Do they need a 30 day or 90 day supply? [place]

## 2017-05-12 NOTE — Telephone Encounter (Signed)
Please advise on message below.

## 2017-05-12 NOTE — Telephone Encounter (Signed)
90 day supply sent to [facility].

## 2017-05-16 ENCOUNTER — Ambulatory Visit (INDEPENDENT_AMBULATORY_CARE_PROVIDER_SITE_OTHER): Payer: Medicare Other | Admitting: Podiatry

## 2017-05-16 ENCOUNTER — Encounter: Payer: Self-pay | Admitting: Podiatry

## 2017-05-16 DIAGNOSIS — M79676 Pain in unspecified toe(s): Secondary | ICD-10-CM | POA: Diagnosis not present

## 2017-05-16 DIAGNOSIS — B351 Tinea unguium: Secondary | ICD-10-CM | POA: Diagnosis not present

## 2017-05-16 NOTE — Progress Notes (Signed)
This patient presents the office with chief complaint of long thick nails  He states the nails are painful walking and wearing his shoes. He is unable to self treat. He  presents the office today for an evaluation and treatment of his nails and  He has a history of gout.  No pain from callus right forefoot today.  GENERAL APPEARANCE: Alert, conversant. Appropriately groomed. No acute distress.  VASCULAR: Pedal pulses are  palpable at  [facility] and PT bilateral.  Capillary refill time is immediate to all digits,  Normal temperature gradient.  Digital hair growth is present bilateral  NEUROLOGIC: sensation is normal to 5.07 monofilament at 5/5 sites bilateral.  Light touch is intact bilateral, Muscle strength normal.  MUSCULOSKELETAL: acceptable muscle strength, tone and stability bilateral.  Intrinsic muscluature intact bilateral.  Rectus appearance of foot .  Severe hammer toes 1-5  B/L   DERMATOLOGIC: skin color, texture, and turgor are within normal limits.  No preulcerative lesions or ulcers  are seen, no interdigital maceration noted.  No open lesions present.  . No drainage noted. Asymptomatic  Porokeratosis/Wart, sub-4 of the right foot  NAILS  thick disfigured discolored hallux toenails, both feet   Onychomycosis hallux nails    Debridement of nails. RTC  3 months for preventive foot care services.  Gardiner Barefoot DPM

## 2017-05-17 NOTE — Telephone Encounter (Signed)
LMOVM for pt to return call 

## 2017-05-17 NOTE — Telephone Encounter (Signed)
Pt informed of results and states he will f/u with cardiology. Nothing further needed.

## 2017-05-17 NOTE — Telephone Encounter (Signed)
The spirometry done in the office was consistent with moderate COPD The CXR showed mild-moderate vhanges of CHF/pulmonary edema - this should be addressed by his cardiologist  Merton Border, MD PCCM service Mobile [phone] Pager [phone] 05/17/2017 3:30 PM

## 2017-05-26 ENCOUNTER — Encounter: Payer: Self-pay | Admitting: Internal Medicine

## 2017-05-26 ENCOUNTER — Ambulatory Visit: Payer: Medicare Other | Attending: Internal Medicine

## 2017-05-26 DIAGNOSIS — G4733 Obstructive sleep apnea (adult) (pediatric): Secondary | ICD-10-CM | POA: Insufficient documentation

## 2017-05-26 DIAGNOSIS — G4761 Periodic limb movement disorder: Secondary | ICD-10-CM | POA: Diagnosis not present

## 2017-05-28 ENCOUNTER — Other Ambulatory Visit: Payer: Self-pay | Admitting: Internal Medicine

## 2017-05-30 DIAGNOSIS — I25708 Atherosclerosis of coronary artery bypass graft(s), unspecified, with other forms of angina pectoris: Secondary | ICD-10-CM | POA: Diagnosis not present

## 2017-05-30 DIAGNOSIS — I255 Ischemic cardiomyopathy: Secondary | ICD-10-CM | POA: Diagnosis not present

## 2017-05-30 DIAGNOSIS — R0602 Shortness of breath: Secondary | ICD-10-CM | POA: Diagnosis not present

## 2017-05-30 DIAGNOSIS — I4430 Unspecified atrioventricular block: Secondary | ICD-10-CM | POA: Diagnosis not present

## 2017-06-01 ENCOUNTER — Telehealth: Payer: Self-pay | Admitting: Internal Medicine

## 2017-06-01 ENCOUNTER — Telehealth: Payer: Self-pay | Admitting: *Deleted

## 2017-06-01 DIAGNOSIS — G4733 Obstructive sleep apnea (adult) (pediatric): Secondary | ICD-10-CM | POA: Diagnosis not present

## 2017-06-01 DIAGNOSIS — R739 Hyperglycemia, unspecified: Secondary | ICD-10-CM

## 2017-06-01 DIAGNOSIS — D696 Thrombocytopenia, unspecified: Secondary | ICD-10-CM

## 2017-06-01 DIAGNOSIS — D510 Vitamin B12 deficiency anemia due to intrinsic factor deficiency: Secondary | ICD-10-CM

## 2017-06-01 DIAGNOSIS — D508 Other iron deficiency anemias: Secondary | ICD-10-CM

## 2017-06-01 DIAGNOSIS — E785 Hyperlipidemia, unspecified: Secondary | ICD-10-CM

## 2017-06-01 NOTE — Telephone Encounter (Signed)
Pt called requesting a b12 shot. The last b12 shot was given on 09/27/16. Please advise, thank you!  Call pt @ [phone]

## 2017-06-01 NOTE — Telephone Encounter (Signed)
I looked back at his chart was given a B12 in Fall of last year, no b12 level in >then 4 years.  Should we get a new level and determine if he still needs injections? No upcoming appt with you either.  Please advise, thanks

## 2017-06-01 NOTE — Telephone Encounter (Signed)
He needs a lab and nurse visit scheduled, labs are ordered.  Will need to be a fasting lab appt. thanks

## 2017-06-01 NOTE — Telephone Encounter (Signed)
Noted thanks °

## 2017-06-01 NOTE — Telephone Encounter (Signed)
-----   Message from Laverle Hobby, MD sent at 06/01/2017 12:53 PM EDT ----- Regarding: cpap titration study results CPAP range of 12 to 20 cmH2O 1L of oxygen with CPAP.

## 2017-06-01 NOTE — Telephone Encounter (Signed)
b12 and instrinsic factor need to be drawn PRIOR TO his injection.  Same day ok.  fasting required because he is over due for labs ordered last October

## 2017-06-01 NOTE — Telephone Encounter (Signed)
Pt scheduled for labs for 7/5 and b-12 on 7/10.

## 2017-06-07 ENCOUNTER — Telehealth: Payer: Self-pay | Admitting: Pulmonary Disease

## 2017-06-07 NOTE — Telephone Encounter (Signed)
Per Bethanne Ginger at [facility] according to CPAP Titration Study, pt only spend 4.0 minutes below 88%. According to [facility] guidelines pt must be at least 5 minutes below 88%. Therefore, pt does not qualify for o2 to be bled into CPAP.  Just FYI. [place] patient and he is aware that no oxygen will be bled into CPAP only pressure change is needed which is being handled by [facility]. Rhonda J Cobb

## 2017-06-09 ENCOUNTER — Other Ambulatory Visit: Payer: Medicare Other

## 2017-06-10 ENCOUNTER — Other Ambulatory Visit (INDEPENDENT_AMBULATORY_CARE_PROVIDER_SITE_OTHER): Payer: Medicare Other

## 2017-06-10 DIAGNOSIS — E785 Hyperlipidemia, unspecified: Secondary | ICD-10-CM | POA: Diagnosis not present

## 2017-06-10 DIAGNOSIS — D510 Vitamin B12 deficiency anemia due to intrinsic factor deficiency: Secondary | ICD-10-CM | POA: Diagnosis not present

## 2017-06-10 DIAGNOSIS — D508 Other iron deficiency anemias: Secondary | ICD-10-CM | POA: Diagnosis not present

## 2017-06-10 DIAGNOSIS — R739 Hyperglycemia, unspecified: Secondary | ICD-10-CM | POA: Diagnosis not present

## 2017-06-10 LAB — COMPREHENSIVE METABOLIC PANEL
ALT: 9 U/L (ref 0–53)
AST: 15 U/L (ref 0–37)
Albumin: 4.1 g/dL (ref 3.5–5.2)
Alkaline Phosphatase: 59 U/L (ref 39–117)
BUN: 21 mg/dL (ref 6–23)
CO2: 30 mEq/L (ref 19–32)
Calcium: 9.5 mg/dL (ref 8.4–10.5)
Chloride: 102 mEq/L (ref 96–112)
Creatinine, Ser: 1.27 mg/dL (ref 0.40–1.50)
GFR: 58.64 mL/min — ABNORMAL LOW (ref >60.00)
Glucose, Bld: 104 mg/dL — ABNORMAL HIGH (ref 70–99)
Potassium: 4.8 mEq/L (ref 3.5–5.1)
Sodium: 138 mEq/L (ref 135–145)
Total Bilirubin: 0.8 mg/dL (ref 0.2–1.2)
Total Protein: 7.1 g/dL (ref 6.0–8.3)

## 2017-06-10 LAB — CBC WITH DIFFERENTIAL/PLATELET
Basophils Absolute: 0 10*3/uL (ref 0.0–0.1)
Basophils Relative: 0.8 % (ref 0.0–3.0)
Eosinophils Absolute: 0.1 10*3/uL (ref 0.0–0.7)
Eosinophils Relative: 1.9 % (ref 0.0–5.0)
HCT: 36.8 % — ABNORMAL LOW (ref 39.0–52.0)
Hemoglobin: 12.2 g/dL — ABNORMAL LOW (ref 13.0–17.0)
Lymphocytes Relative: 18.2 % (ref 12.0–46.0)
Lymphs Abs: 0.9 10*3/uL (ref 0.7–4.0)
MCHC: 33.2 g/dL (ref 30.0–36.0)
MCV: 95.6 fl (ref 78.0–100.0)
Monocytes Absolute: 0.5 10*3/uL (ref 0.1–1.0)
Monocytes Relative: 9.8 % (ref 3.0–12.0)
Neutro Abs: 3.4 10*3/uL (ref 1.4–7.7)
Neutrophils Relative %: 69.3 % (ref 43.0–77.0)
Platelets: 109 10*3/uL — ABNORMAL LOW (ref 150.0–400.0)
RBC: 3.85 Mil/uL — ABNORMAL LOW (ref 4.22–5.81)
RDW: 15.8 % — ABNORMAL HIGH (ref 11.5–15.5)
WBC: 4.9 10*3/uL (ref 4.0–10.5)

## 2017-06-10 LAB — LIPID PANEL
Cholesterol: 143 mg/dL (ref 0–200)
HDL: 35.3 mg/dL — ABNORMAL LOW (ref >39.00)
LDL Cholesterol: 89 mg/dL (ref 0–99)
NonHDL: 108.11
Total CHOL/HDL Ratio: 4
Triglycerides: 95 mg/dL (ref 0.0–149.0)
VLDL: 19 mg/dL (ref 0.0–40.0)

## 2017-06-10 LAB — HEMOGLOBIN A1C: Hgb A1c MFr Bld: 5.7 % (ref 4.6–6.5)

## 2017-06-10 LAB — VITAMIN B12: Vitamin B-12: 195 pg/mL — ABNORMAL LOW (ref 211–911)

## 2017-06-13 ENCOUNTER — Ambulatory Visit (INDEPENDENT_AMBULATORY_CARE_PROVIDER_SITE_OTHER): Payer: Medicare Other | Admitting: Pulmonary Disease

## 2017-06-13 ENCOUNTER — Encounter: Payer: Self-pay | Admitting: Pulmonary Disease

## 2017-06-13 VITALS — BP 98/62 | HR 87 | Ht 75.0 in | Wt 264.0 lb

## 2017-06-13 DIAGNOSIS — I255 Ischemic cardiomyopathy: Secondary | ICD-10-CM | POA: Diagnosis not present

## 2017-06-13 DIAGNOSIS — G4733 Obstructive sleep apnea (adult) (pediatric): Secondary | ICD-10-CM

## 2017-06-13 DIAGNOSIS — J449 Chronic obstructive pulmonary disease, unspecified: Secondary | ICD-10-CM | POA: Diagnosis not present

## 2017-06-13 LAB — INTRINSIC FACTOR ANTIBODIES: Intrinsic Factor: NEGATIVE

## 2017-06-13 NOTE — Patient Instructions (Addendum)
Continue current settings on CPAP (AutoSet 12-20 cm H2O) We will obtain a compliance report in a month from now on these new settings and communicate results to you Continue Anoro inhaler Follow-up with me in 4-6 months or sooner as needed

## 2017-06-14 ENCOUNTER — Telehealth: Payer: Self-pay | Admitting: Internal Medicine

## 2017-06-14 ENCOUNTER — Ambulatory Visit: Payer: Medicare Other

## 2017-06-14 ENCOUNTER — Encounter: Payer: Self-pay | Admitting: Internal Medicine

## 2017-06-14 NOTE — Telephone Encounter (Signed)
Tried to reach patient at home to advise patient no order for B 12 injection that would need to read my chart message and advise PCP of medication choice and PCP would need to give order.

## 2017-06-14 NOTE — Progress Notes (Signed)
PULMONARY OFFICE FOLLOW UP NOTE  PROBLEMS:  COPD OSA Ischemic cardiomyopathy Chronic AF  DATA: Sleep studies (2011): Moderate obstructive sleep apnea. Recommended CPAP 12 cm H2O CT chest (10/07/14): Findings consistent with congestive heart failure and pulmonary edema PFTs (01/29/16): FVC: 3.38 > 3.92 L (68 > 79 %pred), FEV1: 2.29 >2.89 L (63 > 80 %pred), FEV1/FVC:68 >74%, TLC:  6.60 L (84  %pred), DLCO 51% pred Spirometry (05/05/17): 2.64 FVC:  L (50 to %pred), FEV1: 1.72 L (46 %pred), FEV1/FVC: 65% CPAP compliance 05/01-05/30/18: CPAP worn 30/30 nights, greater than 4 hours 30/30 nights. AHI greater than 10/hr 10 out of 30 nights CPAP titration 05/26/17: recommend autoset 12-20 cm H2O with O2 1 LPM bleed in   INTERVAL HISTORY: No major events  SUBJ: Last visit, tried change from Anoro to Trelegy without noticeable change in respiratory symptoms. CPAP titration (repeat study) documented above. He has changed to Autoset 12-20 cm H2O. Overall, now much improved.. Denies daytime hypersomnolence. Denies DOE. Denies CP, orthopnea, PND, fever, purulent sputum, hemoptysis, increasing LE edema and calf tenderness.   OBJ: Vitals:   06/13/17 1038  BP: 98/62  Pulse: 87  SpO2: 96%  Weight: 264 lb (119.7 kg)  Height: 6\' 3"  (1.905 m)  RA  Gen: NAD HEENT: WNL Neck: No LAN, no JVD noted Lungs: Full without adventitious sounds Cardiovascular: Reg, no M noted Abdomen: Soft, NT, +BS Ext: no C/C/E. Chronic stasis changes present Neuro: PERRL, EOMI, motor/sensory grossly intact Skin: No lesions noted   DATA: Spirometry (05/05/17): 2.64 FVC:  L (50 to %pred), FEV1: 1.72 L (46 %pred), FEV1/FVC: 65%  Chest x-ray (05/05/17): Vascular redistribution, interstitial prominence, small right pleural effusion. All consistent with heart failure  CPAP compliance results as above IMPRESSION: OSA - much improved symptom control with change to autoset  Documented history of COPD, never  smoker  Mixed pattern of obstruction and restriction on spirometry  Ischemic cardiomyopathy with chronic AF DOE - resolved   PLAN: Continue current settings on CPAP (AutoSet 12-20 cm H2O) We will obtain a compliance report in a month from now on these new settings and communicate results to him Continue Anoro inhaler Follow-up with me in 4-6 months or sooner as needed   Merton Border, MD PCCM service Mobile [phone] Pager [phone] 06/14/2017.

## 2017-06-15 ENCOUNTER — Telehealth: Payer: Self-pay | Admitting: Internal Medicine

## 2017-06-15 MED ORDER — CYANOCOBALAMIN 1000 MCG SL SUBL: 1.0000 | SUBLINGUAL_TABLET | Freq: Every morning | SUBLINGUAL | 3 refills | Status: DC

## 2017-06-15 NOTE — Telephone Encounter (Signed)
MyChart message sent .  Needs 3 weekly B12 shots,, then he wil continue orla supplemements. Please give him rn visit

## 2017-06-15 NOTE — Telephone Encounter (Signed)
Spoke with pt and he stated that he received Dr. Germaine Pomfret message. While on the phone with pt we went ahead and scheduled all three of his b12 injections. Pt is aware of appts dates and times.

## 2017-06-16 ENCOUNTER — Ambulatory Visit (INDEPENDENT_AMBULATORY_CARE_PROVIDER_SITE_OTHER): Payer: Medicare Other | Admitting: *Deleted

## 2017-06-16 DIAGNOSIS — E538 Deficiency of other specified B group vitamins: Secondary | ICD-10-CM | POA: Diagnosis not present

## 2017-06-16 MED ORDER — CYANOCOBALAMIN 1000 MCG/ML IJ SOLN
1000.0000 ug | Freq: Once | INTRAMUSCULAR | Status: AC
  Administered 2017-06-16: 1000 ug via INTRAMUSCULAR

## 2017-06-16 NOTE — Progress Notes (Signed)
Patient presented for B 12 injection to left deltoid, patient voiced no concerns nor showed any signs of distress during injection. While in office for B12 injection patient C/O having his hearing aide ear piece stuck in his ear canal. Ask DrLacinda Axon to take a look and advised . Dr.Cook removed ear piece from left ear with forceps and a ear curette. Ear was irrigated with warm water as direcedt by MD. Patient voiced he felt no pain and no irritation during removal or after.

## 2017-06-16 NOTE — Progress Notes (Signed)
Foreign body visualized and removed with forceps after using a curette. Mild bleeding noted from using curette. No alligator forceps were available.  [place]

## 2017-06-22 ENCOUNTER — Ambulatory Visit (INDEPENDENT_AMBULATORY_CARE_PROVIDER_SITE_OTHER): Payer: Medicare Other | Admitting: *Deleted

## 2017-06-22 DIAGNOSIS — E538 Deficiency of other specified B group vitamins: Secondary | ICD-10-CM

## 2017-06-22 MED ORDER — CYANOCOBALAMIN 1000 MCG/ML IJ SOLN
1000.0000 ug | Freq: Once | INTRAMUSCULAR | Status: AC
  Administered 2017-06-22: 1000 ug via INTRAMUSCULAR

## 2017-06-22 NOTE — Progress Notes (Signed)
Patient presented for B 12 injection to right deltoid, patient voiced no concerns nor showed any signs of distress during injection. 

## 2017-06-23 DIAGNOSIS — I4430 Unspecified atrioventricular block: Secondary | ICD-10-CM | POA: Diagnosis not present

## 2017-06-23 DIAGNOSIS — I480 Paroxysmal atrial fibrillation: Secondary | ICD-10-CM | POA: Diagnosis not present

## 2017-06-29 ENCOUNTER — Ambulatory Visit (INDEPENDENT_AMBULATORY_CARE_PROVIDER_SITE_OTHER): Payer: Medicare Other | Admitting: *Deleted

## 2017-06-29 DIAGNOSIS — E538 Deficiency of other specified B group vitamins: Secondary | ICD-10-CM

## 2017-06-29 MED ORDER — CYANOCOBALAMIN 1000 MCG/ML IJ SOLN
1000.0000 ug | Freq: Once | INTRAMUSCULAR | Status: AC
  Administered 2017-06-29: 1000 ug via INTRAMUSCULAR

## 2017-06-29 NOTE — Progress Notes (Signed)
Patient presented for B 12 injection to left deltoid, patient voiced no concerns nor showed any signs of distress during injection. 

## 2017-07-04 ENCOUNTER — Other Ambulatory Visit: Payer: Self-pay | Admitting: Family

## 2017-07-04 DIAGNOSIS — R0602 Shortness of breath: Secondary | ICD-10-CM | POA: Diagnosis not present

## 2017-07-04 DIAGNOSIS — I25810 Atherosclerosis of coronary artery bypass graft(s) without angina pectoris: Secondary | ICD-10-CM | POA: Diagnosis not present

## 2017-07-04 DIAGNOSIS — I482 Chronic atrial fibrillation: Secondary | ICD-10-CM | POA: Diagnosis not present

## 2017-07-04 DIAGNOSIS — I5022 Chronic systolic (congestive) heart failure: Secondary | ICD-10-CM | POA: Diagnosis not present

## 2017-07-04 DIAGNOSIS — I25708 Atherosclerosis of coronary artery bypass graft(s), unspecified, with other forms of angina pectoris: Secondary | ICD-10-CM | POA: Diagnosis not present

## 2017-07-10 ENCOUNTER — Other Ambulatory Visit: Payer: Self-pay | Admitting: Family

## 2017-07-11 ENCOUNTER — Encounter: Payer: Self-pay | Admitting: Internal Medicine

## 2017-07-12 ENCOUNTER — Ambulatory Visit: Payer: Medicare Other | Attending: Family | Admitting: Family

## 2017-07-12 ENCOUNTER — Encounter: Payer: Self-pay | Admitting: Family

## 2017-07-12 VITALS — BP 104/51 | HR 83 | Resp 20 | Ht 75.0 in | Wt 261.2 lb

## 2017-07-12 DIAGNOSIS — Z808 Family history of malignant neoplasm of other organs or systems: Secondary | ICD-10-CM | POA: Insufficient documentation

## 2017-07-12 DIAGNOSIS — Z836 Family history of other diseases of the respiratory system: Secondary | ICD-10-CM | POA: Diagnosis not present

## 2017-07-12 DIAGNOSIS — Z951 Presence of aortocoronary bypass graft: Secondary | ICD-10-CM | POA: Insufficient documentation

## 2017-07-12 DIAGNOSIS — I110 Hypertensive heart disease with heart failure: Secondary | ICD-10-CM | POA: Diagnosis not present

## 2017-07-12 DIAGNOSIS — Z86010 Personal history of colonic polyps: Secondary | ICD-10-CM | POA: Insufficient documentation

## 2017-07-12 DIAGNOSIS — I959 Hypotension, unspecified: Secondary | ICD-10-CM | POA: Diagnosis not present

## 2017-07-12 DIAGNOSIS — I4891 Unspecified atrial fibrillation: Secondary | ICD-10-CM | POA: Insufficient documentation

## 2017-07-12 DIAGNOSIS — I950 Idiopathic hypotension: Secondary | ICD-10-CM

## 2017-07-12 DIAGNOSIS — I429 Cardiomyopathy, unspecified: Secondary | ICD-10-CM | POA: Diagnosis not present

## 2017-07-12 DIAGNOSIS — Z9852 Vasectomy status: Secondary | ICD-10-CM | POA: Diagnosis not present

## 2017-07-12 DIAGNOSIS — J449 Chronic obstructive pulmonary disease, unspecified: Secondary | ICD-10-CM | POA: Insufficient documentation

## 2017-07-12 DIAGNOSIS — D649 Anemia, unspecified: Secondary | ICD-10-CM | POA: Diagnosis not present

## 2017-07-12 DIAGNOSIS — E785 Hyperlipidemia, unspecified: Secondary | ICD-10-CM | POA: Insufficient documentation

## 2017-07-12 DIAGNOSIS — G4733 Obstructive sleep apnea (adult) (pediatric): Secondary | ICD-10-CM | POA: Diagnosis not present

## 2017-07-12 DIAGNOSIS — Z8546 Personal history of malignant neoplasm of prostate: Secondary | ICD-10-CM | POA: Insufficient documentation

## 2017-07-12 DIAGNOSIS — I482 Chronic atrial fibrillation, unspecified: Secondary | ICD-10-CM

## 2017-07-12 DIAGNOSIS — I255 Ischemic cardiomyopathy: Secondary | ICD-10-CM | POA: Insufficient documentation

## 2017-07-12 DIAGNOSIS — Z7901 Long term (current) use of anticoagulants: Secondary | ICD-10-CM | POA: Diagnosis not present

## 2017-07-12 DIAGNOSIS — Z8042 Family history of malignant neoplasm of prostate: Secondary | ICD-10-CM | POA: Insufficient documentation

## 2017-07-12 DIAGNOSIS — I5022 Chronic systolic (congestive) heart failure: Secondary | ICD-10-CM | POA: Insufficient documentation

## 2017-07-12 DIAGNOSIS — Z95810 Presence of automatic (implantable) cardiac defibrillator: Secondary | ICD-10-CM | POA: Diagnosis not present

## 2017-07-12 DIAGNOSIS — K219 Gastro-esophageal reflux disease without esophagitis: Secondary | ICD-10-CM | POA: Diagnosis not present

## 2017-07-12 DIAGNOSIS — I252 Old myocardial infarction: Secondary | ICD-10-CM | POA: Diagnosis not present

## 2017-07-12 DIAGNOSIS — I2510 Atherosclerotic heart disease of native coronary artery without angina pectoris: Secondary | ICD-10-CM | POA: Diagnosis not present

## 2017-07-12 DIAGNOSIS — Z8249 Family history of ischemic heart disease and other diseases of the circulatory system: Secondary | ICD-10-CM | POA: Insufficient documentation

## 2017-07-12 DIAGNOSIS — Z9889 Other specified postprocedural states: Secondary | ICD-10-CM | POA: Insufficient documentation

## 2017-07-12 NOTE — Progress Notes (Signed)
Patient ID: Rick Gilmore, male    DOB: 1941/09/27, 76 y.o.   MRN: [id number]  HPI  Rick Gilmore is a 76 y/o male with a history of thrombocytopenia, obstructive sleep apnea (with CPAP), prostate cancer, MI, HTN, hyperlipidemia, GERD, COPD, atrial fibrillation, anemia, AICD and chronic heart failure.   Reviewed echo done 07/04/17 which showed an EF of 30% along with severe Rick/TR. Previous echo was done 09/02/16 and showed an EF of 25% along with severe Rick/TR. EF has decreased from 01/16/16 which showed an EF of 30-35% along with mild Rick/TR.   Was in the ED 09/19/16 due to laceration of right lateral thumb after cutting carrots. Due to treatment with eliquis, he was unable to control the bleeding. 4 sutures were placed and he was discharged home. Last admission was 01/16/16 for exacerbation of his heart failure due to dietary noncompliance. Was treated with diuretics, nebulizers and steroids. Discharged home after 3 days.   He presents today for his follow-up visit with mild fatigue upon moderate exertion. He describes this as chronic in nature having been present for several years with waxing/waning of severity. He has associated light-headedness along with this.   Past Medical History:  Diagnosis Date  . AICD (automatic cardioverter/defibrillator) present   . Anemia   . Arrhythmia    a fib  . Atrial fibrillation ([place])    on Coumadin  . CAD (coronary artery disease)   . Cancer [facility]) 2001   Prostate, XRT and implant  . CHF (congestive heart failure) ([place])   . COPD (chronic obstructive pulmonary disease) ([place])   . Erectile dysfunction   . GERD (gastroesophageal reflux disease)   . Hyperlipidemia   . Hypertension   . Ischemic cardiomyopathy    s/p AICD/pacer 2009, replaced 2010 [facility]  . MI (myocardial infarction) ([place]) 1988  . Personal history of prostate cancer 2001   s/p XRT and implant ([place])  . Prostate cancer ([place])   . Sleep apnea, obstructive    uses CPAP nightly  . Thrombocytopenia  ([place])   . Tubular adenoma    colon polyp  . Vitamin B 12 deficiency    Past Surgical History:  Procedure Laterality Date  . CARDIAC CATHETERIZATION    . COLONOSCOPY WITH PROPOFOL [name] 05/30/2015   Procedure: COLONOSCOPY WITH PROPOFOL;  Surgeon: Manya Silvas, MD;  Location: [facility] ENDOSCOPY;  Service: Endoscopy;  Laterality: [name];  . CORONARY ARTERY BYPASS GRAFT  1988  . ESOPHAGOGASTRODUODENOSCOPY [name] 05/30/2015   Procedure: ESOPHAGOGASTRODUODENOSCOPY (EGD);  Surgeon: Manya Silvas, MD;  Location: [facility] ENDOSCOPY;  Service: Endoscopy;  Laterality: [name];  . IMPLANTABLE CARDIOVERTER DEFIBRILLATOR GENERATOR CHANGE  April 2014   Bi V RRR  . INSERT / REPLACE / REMOVE PACEMAKER  2014  . VASECTOMY     Family History  Problem Relation Age of Onset  . Hypertension Mother   . Hypertension Father   . Cancer Father        prostate, throat  . Emphysema Father   . Heart disease Maternal Grandmother   . Heart disease Paternal Grandmother    Social History  Substance Use Topics  . Smoking status: Never Smoker  . Smokeless tobacco: Never Used  . Alcohol use 2.4 oz/week    2 Standard drinks or equivalent, 2 Cans of beer per week     Comment: Mixed drinks   No Known Allergies  Prior to Admission medications   Medication Sig Start Date End Date Taking? Authorizing Provider  allopurinol (ZYLOPRIM) 100 MG  tablet TAKE 1 TABLET DAILY 07/04/17  Yes Darylene Price A, FNP  colchicine 0.6 MG tablet Take 0.6 mg by mouth daily as needed (for gout flares).    Yes [provider]  cyanocobalamin (,VITAMIN B-12,) 1000 MCG/ML injection Inject 1,000 mcg into the muscle every 30 (thirty) days.   Yes [provider]  Cyanocobalamin 1000 MCG SUBL Place 1 tablet (1,000 mcg total) under the tongue every morning. 06/15/17  Yes Crecencio Mc, MD  ELIQUIS 5 MG TABS tablet TAKE 1 TABLET TWICE A DAY 05/30/17  Yes Crecencio Mc, MD  escitalopram (LEXAPRO) 10 MG tablet TAKE 1 TABLET DAILY 05/30/17  Yes  Crecencio Mc, MD  fluticasone (FLONASE) 50 MCG/ACT nasal spray Place 1 spray into both nostrils daily. 05/12/17 08/13/18 Yes Wilhelmina Mcardle, MD  isosorbide mononitrate (IMDUR) 30 MG 24 hr tablet Take 30 mg by mouth daily.   Yes [provider]  KLOR-CON M20 20 MEQ tablet TAKE 1 TABLET DAILY 04/06/17  Yes Crecencio Mc, MD  lisinopril (PRINIVIL,ZESTRIL) 5 MG tablet Take 5 mg by mouth daily.   Yes [provider]  montelukast (SINGULAIR) 10 MG tablet TAKE 1 TABLET AT BEDTIME 01/13/17  Yes Kasa, Maretta Bees, MD  pantoprazole (PROTONIX) 40 MG tablet TAKE 1 TABLET DAILY 04/01/17  Yes Crecencio Mc, MD  TOPROL XL 100 MG 24 hr tablet TAKE 1 TABLET DAILY WITH OR IMMEDIATELY FOLLOWING A MEAL 07/04/17  Yes Aidel Davisson A, FNP  torsemide (DEMADEX) 20 MG tablet TAKE 3 TABLETS IN THE MORNING AND 2 TABLETS IN THE EVENING 07/04/17  Yes Chavis Tessler A, FNP  umeclidinium-vilanterol (ANORO ELLIPTA) 62.5-25 MCG/INH AEPB Inhale 1 puff into the lungs daily. 07/09/16  Yes Alisa Graff, FNP    Review of Systems  Constitutional: Positive for fatigue (minimal). Negative for appetite change and fever.  HENT: Negative for congestion, ear pain, postnasal drip, sinus pressure and sore throat.   Eyes: Negative.   Respiratory: Negative for cough, chest tightness and shortness of breath.   Cardiovascular: Negative for chest pain, palpitations and leg swelling.  Gastrointestinal: Negative for abdominal distention and abdominal pain.  Endocrine: Negative.   Genitourinary: Negative.   Musculoskeletal: Negative for back pain and neck pain.  Skin: Negative.   Allergic/Immunologic: Negative.   Neurological: Positive for light-headedness (when change positions too quickly). Negative for dizziness.  Hematological: Negative for adenopathy. Bruises/bleeds easily.  Psychiatric/Behavioral: Negative for dysphoric mood, sleep disturbance (wearing CPAP at night. sleeping on 1 pillow) and suicidal ideas. The patient is not  nervous/anxious.    Vitals:   07/12/17 0908  BP: (!) 104/51  Pulse: 83  Resp: 20  SpO2: 94%  Weight: 261 lb 4 oz (118.5 kg)  Height: 6\' 3"  (1.905 m)   Wt Readings from Last 3 Encounters:  07/12/17 261 lb 4 oz (118.5 kg)  06/13/17 264 lb (119.7 kg)  05/05/17 264 lb 12.8 oz (120.1 kg)    Lab Results  Component Value Date   CREATININE 1.27 06/10/2017   CREATININE 1.07 04/19/2016   CREATININE 1.12 01/19/2016   Physical Exam  Constitutional: He is oriented to person, place, and time. He appears well-developed and well-nourished.  HENT:  Head: Normocephalic and atraumatic.  Eyes: Pupils are equal, round, and reactive to light. Conjunctivae are normal.  Neck: Normal range of motion. Neck supple. No JVD present.  Cardiovascular: Normal rate.  An irregular rhythm present.  Pulmonary/Chest: Effort normal. He has no wheezes. He has no rales.  Abdominal:  Soft. He exhibits no distension. There is no tenderness.  Musculoskeletal: He exhibits no edema or tenderness.  Neurological: He is alert and oriented to person, place, and time.  Skin: Skin is warm and dry.  Psychiatric: He has a normal mood and affect. His behavior is normal. Thought content normal.  Nursing note and vitals reviewed.  Assessment & Plan:  1: Chronic heart failure with reduced ejection fraction- - NYHA class II - euvolemic today - weight down 5 pounds since he was last here. Reminded to call for an overnight weight gain of >2 pounds or a weekly weight gain of >5 pounds - not adding salt and trying to follow a 2000mg  sodium diet - cardiologist wants to start entresto and patient is going to begin entresto once prior approval is received. Currently remains on lisinopril - saw his cardiologist Nehemiah Massed) 07/04/17 and returns 08/01/17  2: Hypotension- - BP looks good today - does endorse lightheadedness with quick position changes - may be difficult to titrate up toprol due to hypotension  3: Atrial  fibrillation- - currently rate controlled  - on eliquis and toprol XL  4: Obstructive sleep apnea- - wearing CPAP on a nightly basis - reports sleeping well  Reviewed medication bottles with patient.   Return in 6 months or sooner for any questions/problems before then.

## 2017-07-12 NOTE — Patient Instructions (Signed)
Continue weighing daily and call for an overnight weight gain of > 2 pounds or a weekly weight gain of >5 pounds. 

## 2017-07-29 DIAGNOSIS — I25810 Atherosclerosis of coronary artery bypass graft(s) without angina pectoris: Secondary | ICD-10-CM | POA: Diagnosis not present

## 2017-07-29 DIAGNOSIS — I482 Chronic atrial fibrillation: Secondary | ICD-10-CM | POA: Diagnosis not present

## 2017-07-29 DIAGNOSIS — Z951 Presence of aortocoronary bypass graft: Secondary | ICD-10-CM | POA: Diagnosis not present

## 2017-07-29 DIAGNOSIS — I4430 Unspecified atrioventricular block: Secondary | ICD-10-CM | POA: Diagnosis not present

## 2017-07-29 DIAGNOSIS — I5022 Chronic systolic (congestive) heart failure: Secondary | ICD-10-CM | POA: Diagnosis not present

## 2017-08-22 ENCOUNTER — Ambulatory Visit: Payer: Medicare Other | Admitting: Podiatry

## 2017-08-25 ENCOUNTER — Encounter: Payer: Self-pay | Admitting: Podiatry

## 2017-08-25 ENCOUNTER — Ambulatory Visit (INDEPENDENT_AMBULATORY_CARE_PROVIDER_SITE_OTHER): Payer: Medicare Other | Admitting: Podiatry

## 2017-08-25 DIAGNOSIS — M79676 Pain in unspecified toe(s): Secondary | ICD-10-CM | POA: Diagnosis not present

## 2017-08-25 DIAGNOSIS — L989 Disorder of the skin and subcutaneous tissue, unspecified: Secondary | ICD-10-CM

## 2017-08-25 DIAGNOSIS — B351 Tinea unguium: Secondary | ICD-10-CM | POA: Diagnosis not present

## 2017-08-25 NOTE — Progress Notes (Signed)
This patient presents the office with chief complaint of long thick nails  He states the nails are painful walking and wearing his shoes. He is unable to self treat. He  presents the office today for an evaluation and treatment of his nails and  He has a history of gout.  No pain from callus right forefoot today.  GENERAL APPEARANCE: Alert, conversant. Appropriately groomed. No acute distress.  VASCULAR: Pedal pulses are  palpable at  [facility] and PT bilateral.  Capillary refill time is immediate to all digits,  Normal temperature gradient.  Digital hair growth is present bilateral  NEUROLOGIC: sensation is normal to 5.07 monofilament at 5/5 sites bilateral.  Light touch is intact bilateral, Muscle strength normal.  MUSCULOSKELETAL: acceptable muscle strength, tone and stability bilateral.  Intrinsic muscluature intact bilateral.  Rectus appearance of foot .  Severe hammer toes 1-5  B/L   DERMATOLOGIC: skin color, texture, and turgor are within normal limits.  No preulcerative lesions or ulcers  are seen, no interdigital maceration noted.  No open lesions present.  .  Porokeratosis/Wart, sub-4 of the right foot  NAILS  thick disfigured discolored hallux toenails, both feet   Onychomycosis hallux nails  Debride benign skin lesion.  Debridement of nails. RTC  3 months for preventive foot care services.Debridement of skin lesion. Gardiner Barefoot DPM

## 2017-08-31 DIAGNOSIS — D1800 Hemangioma unspecified site: Secondary | ICD-10-CM | POA: Diagnosis not present

## 2017-08-31 DIAGNOSIS — D229 Melanocytic nevi, unspecified: Secondary | ICD-10-CM | POA: Diagnosis not present

## 2017-08-31 DIAGNOSIS — L578 Other skin changes due to chronic exposure to nonionizing radiation: Secondary | ICD-10-CM | POA: Diagnosis not present

## 2017-08-31 DIAGNOSIS — L820 Inflamed seborrheic keratosis: Secondary | ICD-10-CM | POA: Diagnosis not present

## 2017-08-31 DIAGNOSIS — L821 Other seborrheic keratosis: Secondary | ICD-10-CM | POA: Diagnosis not present

## 2017-08-31 DIAGNOSIS — D485 Neoplasm of uncertain behavior of skin: Secondary | ICD-10-CM | POA: Diagnosis not present

## 2017-08-31 DIAGNOSIS — Z85828 Personal history of other malignant neoplasm of skin: Secondary | ICD-10-CM | POA: Diagnosis not present

## 2017-08-31 NOTE — Telephone Encounter (Signed)
See care note  

## 2017-09-22 DIAGNOSIS — I480 Paroxysmal atrial fibrillation: Secondary | ICD-10-CM | POA: Diagnosis not present

## 2017-10-03 ENCOUNTER — Other Ambulatory Visit: Payer: Self-pay | Admitting: Internal Medicine

## 2017-10-24 ENCOUNTER — Ambulatory Visit (INDEPENDENT_AMBULATORY_CARE_PROVIDER_SITE_OTHER): Payer: Medicare Other

## 2017-10-24 VITALS — BP 106/62 | HR 63 | Temp 97.6°F | Resp 14 | Ht 73.0 in | Wt 270.8 lb

## 2017-10-24 DIAGNOSIS — Z0000 Encounter for general adult medical examination without abnormal findings: Secondary | ICD-10-CM | POA: Diagnosis not present

## 2017-10-24 DIAGNOSIS — Z23 Encounter for immunization: Secondary | ICD-10-CM | POA: Diagnosis not present

## 2017-10-24 DIAGNOSIS — Z1331 Encounter for screening for depression: Secondary | ICD-10-CM | POA: Diagnosis not present

## 2017-10-24 NOTE — Progress Notes (Signed)
Subjective:   Rick Gilmore is a 76 y.o. male who presents for Medicare Annual/Subsequent preventive examination.  Review of Systems:  No ROS.  Medicare Wellness Visit. Additional risk factors are reflected in the social history.  Cardiac Risk Factors include: advanced age (>54men, >22 women);male gender;obesity (BMI >30kg/m2);hypertension     Objective:    Vitals: BP 106/62 (BP Location: Left Arm, Patient Position: Sitting, Cuff Size: Normal)   Pulse 63   Temp 97.6 F (36.4 C) (Oral)   Resp 14   Ht 6\' 1"  (1.854 m)   Wt 270 lb 12.8 oz (122.8 kg)   SpO2 97%   BMI 35.73 kg/m   Body mass index is 35.73 kg/m.  Tobacco Social History   Tobacco Use  Smoking Status Never Smoker  Smokeless Tobacco Never Used     Counseling given: Not Answered   Past Medical History:  Diagnosis Date  . AICD (automatic cardioverter/defibrillator) present   . Anemia   . Arrhythmia    a fib  . Atrial fibrillation ([place])    on Coumadin  . CAD (coronary artery disease)   . Cancer [facility]) 2001   Prostate, XRT and implant  . CHF (congestive heart failure) ([place])   . COPD (chronic obstructive pulmonary disease) ([place])   . Erectile dysfunction   . GERD (gastroesophageal reflux disease)   . Hyperlipidemia   . Hypertension   . Ischemic cardiomyopathy    s/p AICD/pacer 2009, replaced 2010 [facility]  . MI (myocardial infarction) ([place]) 1988  . Personal history of prostate cancer 2001   s/p XRT and implant ([place])  . Prostate cancer ([place])   . Sleep apnea, obstructive    uses CPAP nightly  . Thrombocytopenia ([place])   . Tubular adenoma    colon polyp  . Vitamin B 12 deficiency    Past Surgical History:  Procedure Laterality Date  . CARDIAC CATHETERIZATION    . COLONOSCOPY WITH PROPOFOL [name] 05/30/2015   Performed by Manya Silvas, MD at [place]  . CORONARY ARTERY BYPASS GRAFT  1988  . ESOPHAGOGASTRODUODENOSCOPY (EGD) [name] 05/30/2015   Performed by Manya Silvas, MD at [place]    . IMPLANTABLE CARDIOVERTER DEFIBRILLATOR GENERATOR CHANGE  April 2014   Bi V RRR  . INSERT / REPLACE / REMOVE PACEMAKER  2014  . VASECTOMY     Family History  Problem Relation Age of Onset  . Hypertension Mother   . Hypertension Father   . Cancer Father        prostate, throat  . Emphysema Father   . Heart disease Maternal Grandmother   . Heart disease Paternal Grandmother    Social History   Substance and Sexual Activity  Sexual Activity No    Outpatient Encounter Medications as of 10/24/2017  Medication Sig  . allopurinol (ZYLOPRIM) 100 MG tablet TAKE 1 TABLET DAILY  . colchicine 0.6 MG tablet Take 0.6 mg by mouth daily as needed (for gout flares).   . Cyanocobalamin 1000 MCG SUBL Place 1 tablet (1,000 mcg total) under the tongue every morning.  Marland Kitchen ELIQUIS 5 MG TABS tablet TAKE 1 TABLET TWICE A DAY  . escitalopram (LEXAPRO) 10 MG tablet TAKE 1 TABLET DAILY  . fluticasone (FLONASE) 50 MCG/ACT nasal spray Place 1 spray into both nostrils daily.  . isosorbide mononitrate (IMDUR) 30 MG 24 hr tablet Take 30 mg by mouth daily.  Marland Kitchen KLOR-CON M20 20 MEQ tablet TAKE 1 TABLET DAILY  . lisinopril (PRINIVIL,ZESTRIL) 5 MG tablet Take  5 mg by mouth daily.  . montelukast (SINGULAIR) 10 MG tablet TAKE 1 TABLET AT BEDTIME  . pantoprazole (PROTONIX) 40 MG tablet TAKE 1 TABLET DAILY  . TOPROL XL 100 MG 24 hr tablet TAKE 1 TABLET DAILY WITH OR IMMEDIATELY FOLLOWING A MEAL  . torsemide (DEMADEX) 20 MG tablet TAKE 3 TABLETS IN THE MORNING AND 2 TABLETS IN THE EVENING  . umeclidinium-vilanterol (ANORO ELLIPTA) 62.5-25 MCG/INH AEPB Inhale 1 puff into the lungs daily.  . [DISCONTINUED] cyanocobalamin (,VITAMIN B-12,) 1000 MCG/ML injection Inject 1,000 mcg into the muscle every 30 (thirty) days.   No facility-administered encounter medications on file as of 10/24/2017.     Activities of Daily Living In your present state of health, do you have any difficulty performing the following activities:  10/24/2017 07/12/2017  Hearing? Y Y  Comment Hearing aids -  Vision? N N  Difficulty concentrating or making decisions? N Y  Walking or climbing stairs? N N  Dressing or bathing? N N  Doing errands, shopping? N N  Preparing Food and eating ? N -  Using the Toilet? N -  In the past six months, have you accidently leaked urine? N -  Do you have problems with loss of bowel control? N -  Managing your Medications? N -  Managing your Finances? N -  Housekeeping or managing your Housekeeping? N -  Some recent data might be hidden    Patient Care Team: Crecencio Mc, MD as PCP - General (Internal Medicine) Manya Silvas, MD (Gastroenterology) Corey Skains, MD as Consulting Physician (Cardiology) Alisa Graff, FNP as Nurse Practitioner (Family Medicine) Vilinda Boehringer, MD (Inactive) as Consulting Physician (Pulmonary Disease)   Assessment:    This is a routine wellness examination for Rick Gilmore. The goal of the wellness visit is to assist the patient how to close the gaps in care and create a preventative care plan for the patient.   The roster of all physicians providing medical care to patient is listed in the Snapshot section of the chart.  Osteoporosis risk reviewed.    Safety issues reviewed; Smoke and carbon monoxide detectors in the home. Firearms locked up in the home. Wears seatbelts when driving or riding with others. Patient does wear sunscreen or protective clothing when in direct sunlight. No violence in the home.  Depression- PHQ 2 &9 complete.  No signs/symptoms or verbal communication regarding little pleasure in doing things, feeling down, depressed or hopeless. No changes in sleeping, energy, eating, concentrating.  No thoughts of self harm or harm towards others.  Time spent on this topic is 8 minutes.   Patient is alert, normal appearance, oriented to person/place/and time. Correctly identified the president of the [place], recall of 3/3 words, and performing  simple calculations. Displays appropriate judgement and can read correct time from watch face.   No new identified risk were noted.  No failures at ADL's or IADL's.   BMI- discussed the importance of a healthy diet, water intake and the benefits of aerobic exercise. Educational material provided.   Daily fluid intake: 6 cups of caffeine, 2 cups of water  Dental- every 6 months.  Eye- Visual acuity not assessed per patient preference since they have regular follow up with the ophthalmologist.  Wears corrective lenses.  Sleep patterns- Sleeps 8 hours at night.  Wakes feeling rested CPAP in use.  High dose influenza administered L deltoid, tolerated well. Educational material provided.  Health maintenance gaps- closed.  Patient Concerns: None at  this time. Follow up with PCP as needed.  Exercise Activities and Dietary recommendations Current Exercise Habits: The patient does not participate in regular exercise at present  Goals    . Increase physical activity     exercise    . INCREASE WATER INTAKE     Stay hydrated.    Marland Kitchen REDUCE PORTION SIZE     Low carb foods      Fall Risk Fall Risk  10/24/2017 07/12/2017 01/11/2017 10/22/2016 07/09/2016  Falls in the past year? No No No Yes No  Number falls in past yr: - - - 2 or more -  Injury with Fall? - - - - -  Risk Factor Category  - - - - -  Risk for fall due to : - - - - History of fall(s)  Follow up - - - Falls prevention discussed;Education provided -   Depression Screen PHQ 2/9 Scores 10/24/2017 07/12/2017 01/11/2017 10/22/2016  PHQ - 2 Score 0 0 0 0  PHQ- 9 Score 0 - - -    Cognitive Function MMSE - Mini Mental State Exam 10/24/2017 10/22/2016  Orientation to time 5 5  Orientation to Place 5 5  Registration 3 3  Attention/ Calculation 5 5  Recall 3 3  Language- name 2 objects 2 2  Language- repeat 1 1  Language- follow 3 step command 3 3  Language- read & follow direction 1 1  Write a sentence 1 1  Copy design 1 1    Total score 30 30        Immunization History  Administered Date(s) Administered  . Influenza Split 09/14/2011, 08/31/2012  . Influenza, High Dose Seasonal PF 09/27/2016, 10/24/2017  . Influenza,inj,Quad PF,6+ Mos 09/05/2013, 08/08/2014, 09/02/2015  . Pneumococcal Conjugate-13 06/01/2011  . Pneumococcal Polysaccharide-23 07/13/2013  . Tdap 11/03/2011, 09/19/2016  . Zoster 10/06/2011   Screening Tests Health Maintenance  Topic Date Due  . TETANUS/TDAP  09/19/2026  . INFLUENZA VACCINE  Completed  . PNA vac Low Risk Adult  Completed      Plan:    End of life planning; Advance aging; Advanced directives discussed. Copy of current HCPOA/Living Will requested.    I have personally reviewed and noted the following in the patient's chart:   . Medical and social history . Use of alcohol, tobacco or illicit drugs  . Current medications and supplements . Functional ability and status . Nutritional status . Physical activity . Advanced directives . List of other physicians . Hospitalizations, surgeries, and ER visits in previous 12 months . Vitals . Screenings to include cognitive, depression, and falls . Referrals and appointments  In addition, I have reviewed and discussed with patient certain preventive protocols, quality metrics, and best practice recommendations. A written personalized care plan for preventive services as well as general preventive health recommendations were provided to patient.    I have reviewed the above information and agree with above.   Deborra Medina, MD  Varney Biles, LPN  [id number]

## 2017-10-24 NOTE — Patient Instructions (Addendum)
  [name] , Thank you for taking time to come for your Medicare Wellness Visit. I appreciate your ongoing commitment to your health goals. Please review the following plan we discussed and let me know if I can assist you in the future.   These are the goals we discussed: Goals    . Increase physical activity     exercise    . INCREASE WATER INTAKE     Stay hydrated.    Marland Kitchen REDUCE PORTION SIZE     Low carb foods       This is a list of the screening recommended for you and due dates:  Health Maintenance  Topic Date Due  . Tetanus Vaccine  09/19/2026  . Flu Shot  Completed  . Pneumonia vaccines  Completed

## 2017-11-18 ENCOUNTER — Telehealth: Payer: Self-pay | Admitting: *Deleted

## 2017-11-18 ENCOUNTER — Ambulatory Visit (HOSPITAL_COMMUNITY)
Admission: RE | Admit: 2017-11-18 | Discharge: 2017-11-18 | Disposition: A | Discharge status: Home / Self Care | Payer: Medicare Other | Source: Ambulatory Visit | Attending: Cardiovascular Disease | Admitting: Cardiovascular Disease

## 2017-11-18 ENCOUNTER — Ambulatory Visit (INDEPENDENT_AMBULATORY_CARE_PROVIDER_SITE_OTHER): Payer: Medicare Other | Admitting: Internal Medicine

## 2017-11-18 ENCOUNTER — Encounter: Payer: Self-pay | Admitting: Internal Medicine

## 2017-11-18 VITALS — BP 124/66 | HR 91 | Temp 98.1°F | Resp 16 | Wt 264.0 lb

## 2017-11-18 DIAGNOSIS — M7989 Other specified soft tissue disorders: Secondary | ICD-10-CM | POA: Diagnosis not present

## 2017-11-18 DIAGNOSIS — M79661 Pain in right lower leg: Secondary | ICD-10-CM | POA: Diagnosis not present

## 2017-11-18 DIAGNOSIS — I255 Ischemic cardiomyopathy: Secondary | ICD-10-CM | POA: Diagnosis not present

## 2017-11-18 NOTE — Patient Instructions (Addendum)
An [place] of your leg was ordered.    We will call you with the results and with further instructions.

## 2017-11-18 NOTE — Assessment & Plan Note (Signed)
Probable ruptured Baker's cyst, but need to rule out DVT Ultrasound of the right lower extremity stat He is on Eliquis and taking it regularly Torn muscle less likely Further evaluation/treatment depending on the ultrasound results Advised him to monitor right lower calf for swelling, bruising and to call immediately if he notices right foot is cool to touch

## 2017-11-18 NOTE — Progress Notes (Signed)
Subjective:    Patient ID: Rick Gilmore, male    DOB: 1941/11/19, 76 y.o.   MRN: [id number]  HPI He is here for an acute visit.    4-5 days ago he twisted his right leg.  He did not fall and is unsure how he injured the leg.  He just turned the leg and feels like he pulled something. Soon after that occurred he noticed swelling in the right calf and it feels tight.  He denies any bruising, numbness/tingling.   He has pain when he walks on it - it  Is more of a discomfort.  The leg feels warm.   He denies knee or ankle pain.       Medications and allergies reviewed with patient and updated if appropriate.  Patient Active Problem List   Diagnosis Date Noted  . Laceration of finger of right hand 09/28/2016  . Foreign body in ear, left, initial encounter 09/28/2016  . Obesity 04/20/2016  . Hearing loss of left ear due to cerumen impaction 04/19/2016  . Hypotension 03/10/2016  . Allergic rhinitis 02/02/2016  . Chronic renal insufficiency 01/16/2016  . Hyperglycemia 01/16/2016  . 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 S/P implantation of automatic cardioverter/defibrillator (AICD) 10/21/2015  . Diverticulosis of colon without hemorrhage 10/21/2015  . Internal hemorrhoids 10/21/2015  . Atrial fibrillation ([place]) 10/15/2015  . Tubular adenoma of colon 06/04/2015  . COPD (chronic obstructive pulmonary disease) ([place]) 04/20/2015  . Iron deficiency anemia 04/13/2015  . Thrombocytopenia ([place]) 12/01/2014  . Hyperlipidemia LDL goal <70 12/01/2014  . Pleural effusion, bilateral 10/17/2014  . Chronic systolic heart failure ([place]) 10/08/2014  . Long term current use of anticoagulant therapy 11/02/2011  . Pernicious anemia 11/02/2011  . Ischemic cardiomyopathy   . Sleep apnea, obstructive     Current Outpatient Medications on File Prior to Visit  Medication Sig Dispense Refill  . allopurinol (ZYLOPRIM) 100 MG tablet TAKE 1 TABLET DAILY 90 tablet 3  . colchicine 0.6 MG  tablet Take 0.6 mg by mouth daily as needed (for gout flares).     . Cyanocobalamin 1000 MCG SUBL Place 1 tablet (1,000 mcg total) under the tongue every morning. 90 tablet 3  . ELIQUIS 5 MG TABS tablet TAKE 1 TABLET TWICE A DAY 180 tablet 2  . escitalopram (LEXAPRO) 10 MG tablet TAKE 1 TABLET DAILY 90 tablet 1  . fluticasone (FLONASE) 50 MCG/ACT nasal spray Place 1 spray into both nostrils daily. 48 g 3  . isosorbide mononitrate (IMDUR) 30 MG 24 hr tablet Take 30 mg by mouth daily.  11  . KLOR-CON M20 20 MEQ tablet TAKE 1 TABLET DAILY 90 tablet 1  . lisinopril (PRINIVIL,ZESTRIL) 5 MG tablet Take 5 mg by mouth daily.    . montelukast (SINGULAIR) 10 MG tablet TAKE 1 TABLET AT BEDTIME 90 tablet 3  . pantoprazole (PROTONIX) 40 MG tablet TAKE 1 TABLET DAILY 90 tablet 3  . TOPROL XL 100 MG 24 hr tablet TAKE 1 TABLET DAILY WITH OR IMMEDIATELY FOLLOWING A MEAL 90 tablet 3  . torsemide (DEMADEX) 20 MG tablet TAKE 3 TABLETS IN THE MORNING AND 2 TABLETS IN THE EVENING 450 tablet 3  . umeclidinium-vilanterol (ANORO ELLIPTA) 62.5-25 MCG/INH AEPB Inhale 1 puff into the lungs daily. 3 each 4   No current facility-administered medications on file prior to visit.     Past Medical History:  Diagnosis Date  . AICD (automatic cardioverter/defibrillator) present   . Anemia   .  Arrhythmia    a fib  . Atrial fibrillation ([place])    on Coumadin  . CAD (coronary artery disease)   . Cancer [facility]) 2001   Prostate, XRT and implant  . CHF (congestive heart failure) ([place])   . COPD (chronic obstructive pulmonary disease) ([place])   . Erectile dysfunction   . GERD (gastroesophageal reflux disease)   . Hyperlipidemia   . Hypertension   . Ischemic cardiomyopathy    s/p AICD/pacer 2009, replaced 2010 [facility]  . MI (myocardial infarction) ([place]) 1988  . Personal history of prostate cancer 2001   s/p XRT and implant ([place])  . Prostate cancer ([place])   . Sleep apnea, obstructive    uses CPAP nightly  . Thrombocytopenia  ([place])   . Tubular adenoma    colon polyp  . Vitamin B 12 deficiency     Past Surgical History:  Procedure Laterality Date  . CARDIAC CATHETERIZATION    . COLONOSCOPY WITH PROPOFOL [name] 05/30/2015   Procedure: COLONOSCOPY WITH PROPOFOL;  Surgeon: Manya Silvas, MD;  Location: [facility] ENDOSCOPY;  Service: Endoscopy;  Laterality: [name];  . CORONARY ARTERY BYPASS GRAFT  1988  . ESOPHAGOGASTRODUODENOSCOPY [name] 05/30/2015   Procedure: ESOPHAGOGASTRODUODENOSCOPY (EGD);  Surgeon: Manya Silvas, MD;  Location: [facility] ENDOSCOPY;  Service: Endoscopy;  Laterality: [name];  . IMPLANTABLE CARDIOVERTER DEFIBRILLATOR GENERATOR CHANGE  April 2014   Bi V RRR  . INSERT / REPLACE / REMOVE PACEMAKER  2014  . VASECTOMY      Social History   Socioeconomic History  . Marital status: Single    Spouse name: None  . Number of children: None  . Years of education: None  . Highest education level: None  Social Needs  . Financial resource strain: None  . Food insecurity - worry: Never true  . Food insecurity - inability: Never true  . Transportation needs - medical: No  . Transportation needs - non-medical: No  Occupational History  . Occupation: Retired  Tobacco Use  . Smoking status: Never Smoker  . Smokeless tobacco: Never Used  Substance and Sexual Activity  . Alcohol use: Yes    Alcohol/week: 2.4 oz    Types: 2 Standard drinks or equivalent, 2 Cans of beer per week    Comment: Mixed drinks  . Drug use: No  . Sexual activity: No  Other Topics Concern  . None  Social History Narrative  . None    Family History  Problem Relation Age of Onset  . Hypertension Mother   . Hypertension Father   . Cancer Father        prostate, throat  . Emphysema Father   . Heart disease Maternal Grandmother   . Heart disease Paternal Grandmother     Review of Systems  Constitutional: Negative for chills and fever.  Cardiovascular: Positive for leg swelling.  Skin: Negative for color change.       Right  calf warm to touch  Neurological: Negative for numbness.       Objective:   Vitals:   11/18/17 1100  BP: 124/66  Pulse: 91  Resp: 16  Temp: 98.1 F (36.7 C)  SpO2: 97%   Wt Readings from Last 3 Encounters:  11/18/17 264 lb (119.7 kg)  10/24/17 270 lb 12.8 oz (122.8 kg)  07/12/17 261 lb 4 oz (118.5 kg)   Body mass index is 34.83 kg/m.   Physical Exam  Constitutional: He appears well-developed and well-nourished. No distress.  HENT:  Head: Normocephalic and atraumatic.  Musculoskeletal: He exhibits edema (right lower leg swollen with minimal tenderness).  Skin: He is not diaphoretic.  Right calf mild warmth.  Bruising posterior right knee and upper lateral calf - he was not aware of this           Assessment & Plan:    See Problem List for Assessment and Plan of chronic medical problems.

## 2017-11-18 NOTE — Telephone Encounter (Signed)
Called patient to inform that the results of [place] is negative for DVT and that no acute abnormalities were seen. Discussed for him to F/U with his PCP if there is any worsening of symptoms and/or if there is no improvement in a couple of weeks.

## 2017-11-24 ENCOUNTER — Ambulatory Visit: Payer: Medicare Other | Admitting: Podiatry

## 2017-11-24 ENCOUNTER — Telehealth: Payer: Self-pay | Admitting: Family

## 2017-11-24 ENCOUNTER — Other Ambulatory Visit: Payer: Self-pay | Admitting: Internal Medicine

## 2017-11-24 NOTE — Telephone Encounter (Signed)
Returned patient's phonecall regarding swelling in his right lower leg. He says that he's recently had a DVT which was negative. He says that a few weeks ago, he twisted his right leg but isn't completely sure what happened. He says that his leg is still swollen although may be "a little" better and he was wondering it he could increase his diuretic. Other leg is normal in size.  Explained that if the swelling was due to inflammation due to an injury and not fluid, increasing the diuretic probably wouldn't help. He is elevating his legs and is currently out of town, house sitting for someone. He says that he's unable to get a sock on his leg because of the swelling.   Encouraged him to get an ACE bandage and wrap the leg snugly but not too tight to provide some compression to the leg. When he returns back to town after Christmas, he's to call his PCP if the swelling continues.   Patient verbalized understanding and was appreciative of the call back.

## 2017-11-25 NOTE — Telephone Encounter (Signed)
Refilled: 05/30/2017 Last OV: 09/27/2016 Next OV: not scheduled

## 2017-12-08 ENCOUNTER — Encounter: Payer: Self-pay | Admitting: Podiatry

## 2017-12-08 ENCOUNTER — Ambulatory Visit (INDEPENDENT_AMBULATORY_CARE_PROVIDER_SITE_OTHER): Payer: Medicare Other | Admitting: Podiatry

## 2017-12-08 DIAGNOSIS — B351 Tinea unguium: Secondary | ICD-10-CM | POA: Diagnosis not present

## 2017-12-08 DIAGNOSIS — D689 Coagulation defect, unspecified: Secondary | ICD-10-CM

## 2017-12-08 DIAGNOSIS — M79676 Pain in unspecified toe(s): Secondary | ICD-10-CM

## 2017-12-08 NOTE — Progress Notes (Addendum)
This patient presents the office with chief complaint of long thick nails  He states the nails are painful walking and wearing his shoes. He is unable to self treat. He  presents the office today for an evaluation and treatment of his nails and  He has a history of gout.  No pain from callus right forefoot today.  Patient is taking eliquiss.  GENERAL APPEARANCE: Alert, conversant. Appropriately groomed. No acute distress.  VASCULAR: Pedal pulses are  palpable at  [facility] and PT bilateral.  Capillary refill time is immediate to all digits,  Normal temperature gradient.  Digital hair growth is present bilateral  NEUROLOGIC: sensation is normal to 5.07 monofilament at 5/5 sites bilateral.  Light touch is intact bilateral, Muscle strength normal.  MUSCULOSKELETAL: acceptable muscle strength, tone and stability bilateral.  Intrinsic muscluature intact bilateral.  Rectus appearance of foot .  Severe hammer toes 1-5  B/L   DERMATOLOGIC: skin color, texture, and turgor are within normal limits.  No preulcerative lesions or ulcers  are seen, no interdigital maceration noted.  No open lesions present.  .  Porokeratosis/Wart, sub-4 of the right foot  NAILS  thick disfigured discolored hallux toenails, both feet   Onychomycosis hallux nails    Debridement of nails. RTC  3 months for preventive foot care services.  ABN signed for 2019.     Gardiner Barefoot DPM

## 2017-12-16 ENCOUNTER — Other Ambulatory Visit: Payer: Self-pay | Admitting: Internal Medicine

## 2017-12-22 DIAGNOSIS — I482 Chronic atrial fibrillation: Secondary | ICD-10-CM | POA: Diagnosis not present

## 2017-12-30 ENCOUNTER — Telehealth: Payer: Self-pay | Admitting: Pulmonary Disease

## 2017-12-30 MED ORDER — UMECLIDINIUM-VILANTEROL 62.5-25 MCG/INH IN AEPB: 1.0000 | INHALATION_SPRAY | Freq: Every day | RESPIRATORY_TRACT | 0 refills | Status: DC

## 2017-12-30 NOTE — Telephone Encounter (Signed)
°*  STAT* If patient is at the pharmacy, call can be transferred to refill team.   1. Which medications need to be refilled? (please list name of each medication and dose if known)  Anoro Ellipta 62.5-25 mcg inh 1 puff q day   2. Which pharmacy/location (including street and city if local pharmacy) is [facility]  3. Do they need a 30 day or 90 day supply? [place]

## 2018-01-10 NOTE — Progress Notes (Signed)
Patient ID: Rick Gilmore, male    DOB: 1941/04/27, 77 y.o.   MRN: [id number]  HPI  Mr Rick Gilmore is a 77 y/o male with a history of thrombocytopenia, obstructive sleep apnea (with CPAP), prostate cancer, MI, HTN, hyperlipidemia, GERD, COPD, atrial fibrillation, anemia, AICD and chronic heart failure.   Reviewed echo done 07/04/17 which showed an EF of 30% along with severe MR/TR. Previous echo was done 09/02/16 and showed an EF of 25% along with severe MR/TR. EF has decreased from 01/16/16 which showed an EF of 30-35% along with mild MR/TR.   Has not been admitted or been in the ED in the last 6 months.  He presents today for his follow-up visit with a chief complaint of minimal fatigue upon moderate exertion. He describes this as chronic in nature having been present for several years with varying levels of severity. He has associated easy bruising along with this. He denies any chest pain, cough, shortness of breath, edema, palpitations, abdominal distention, dizziness, difficulty sleeping or weight gain.   Past Medical History:  Diagnosis Date  . AICD (automatic cardioverter/defibrillator) present   . Anemia   . Arrhythmia    a fib  . Atrial fibrillation ([place])    on Coumadin  . CAD (coronary artery disease)   . Cancer [facility]) 2001   Prostate, XRT and implant  . CHF (congestive heart failure) ([place])   . COPD (chronic obstructive pulmonary disease) ([place])   . Erectile dysfunction   . GERD (gastroesophageal reflux disease)   . Hyperlipidemia   . Hypertension   . Ischemic cardiomyopathy    s/p AICD/pacer 2009, replaced 2010 [facility]  . MI (myocardial infarction) ([place]) 1988  . Personal history of prostate cancer 2001   s/p XRT and implant ([place])  . Prostate cancer ([place])   . Sleep apnea, obstructive    uses CPAP nightly  . Thrombocytopenia ([place])   . Tubular adenoma    colon polyp  . Vitamin B 12 deficiency    Past Surgical History:  Procedure Laterality Date  . CARDIAC CATHETERIZATION     . COLONOSCOPY WITH PROPOFOL [name] 05/30/2015   Procedure: COLONOSCOPY WITH PROPOFOL;  Surgeon: Manya Silvas, MD;  Location: [facility] ENDOSCOPY;  Service: Endoscopy;  Laterality: [name];  . CORONARY ARTERY BYPASS GRAFT  1988  . ESOPHAGOGASTRODUODENOSCOPY [name] 05/30/2015   Procedure: ESOPHAGOGASTRODUODENOSCOPY (EGD);  Surgeon: Manya Silvas, MD;  Location: [facility] ENDOSCOPY;  Service: Endoscopy;  Laterality: [name];  . IMPLANTABLE CARDIOVERTER DEFIBRILLATOR GENERATOR CHANGE  April 2014   Bi V RRR  . INSERT / REPLACE / REMOVE PACEMAKER  2014  . VASECTOMY     Family History  Problem Relation Age of Onset  . Hypertension Mother   . Hypertension Father   . Cancer Father        prostate, throat  . Emphysema Father   . Heart disease Maternal Grandmother   . Heart disease Paternal Grandmother    Social History   Tobacco Use  . Smoking status: Never Smoker  . Smokeless tobacco: Never Used  Substance Use Topics  . Alcohol use: Yes    Alcohol/week: 2.4 oz    Types: 2 Standard drinks or equivalent, 2 Cans of beer per week    Comment: Mixed drinks   No Known Allergies  Prior to Admission medications   Medication Sig Start Date End Date Taking? Authorizing Provider  allopurinol (ZYLOPRIM) 100 MG tablet TAKE 1 TABLET DAILY 07/04/17  Yes Alisa Graff, FNP  colchicine 0.6  MG tablet Take 0.6 mg by mouth daily as needed (for gout flares).    Yes [provider]  Cyanocobalamin 1000 MCG SUBL Place 1 tablet (1,000 mcg total) under the tongue every morning. 06/15/17  Yes Crecencio Mc, MD  ELIQUIS 5 MG TABS tablet TAKE 1 TABLET TWICE A DAY 05/30/17  Yes Crecencio Mc, MD  escitalopram (LEXAPRO) 10 MG tablet TAKE 1 TABLET DAILY 11/25/17  Yes Crecencio Mc, MD  fluticasone (FLONASE) 50 MCG/ACT nasal spray Place 1 spray into both nostrils daily. 05/12/17 08/13/18 Yes Wilhelmina Mcardle, MD  isosorbide mononitrate (IMDUR) 30 MG 24 hr tablet Take 30 mg by mouth daily.   Yes [provider]   KLOR-CON M20 20 MEQ tablet TAKE 1 TABLET DAILY 10/03/17  Yes Crecencio Mc, MD  lisinopril (PRINIVIL,ZESTRIL) 5 MG tablet Take 5 mg by mouth daily.   Yes [provider]  montelukast (SINGULAIR) 10 MG tablet TAKE 1 TABLET AT BEDTIME 12/16/17  Yes Kasa, Maretta Bees, MD  pantoprazole (PROTONIX) 40 MG tablet TAKE 1 TABLET DAILY 04/01/17  Yes Crecencio Mc, MD  TOPROL XL 100 MG 24 hr tablet TAKE 1 TABLET DAILY WITH OR IMMEDIATELY FOLLOWING A MEAL 07/04/17  Yes Tammee Thielke A, FNP  torsemide (DEMADEX) 20 MG tablet TAKE 3 TABLETS IN THE MORNING AND 2 TABLETS IN THE EVENING 07/04/17  Yes Capers Hagmann A, FNP  umeclidinium-vilanterol (ANORO ELLIPTA) 62.5-25 MCG/INH AEPB Inhale 1 puff into the lungs daily. 12/30/17  Yes Wilhelmina Mcardle, MD   Review of Systems  Constitutional: Positive for fatigue (minimal). Negative for appetite change and fever.  HENT: Negative for congestion, ear pain, postnasal drip, sinus pressure and sore throat.   Eyes: Negative.   Respiratory: Negative for cough, chest tightness and shortness of breath.   Cardiovascular: Negative for chest pain, palpitations and leg swelling.  Gastrointestinal: Negative for abdominal distention and abdominal pain.  Endocrine: Negative.   Genitourinary: Negative.   Musculoskeletal: Negative for back pain and neck pain.  Skin: Negative.   Allergic/Immunologic: Negative.   Neurological: Negative for dizziness and light-headedness.  Hematological: Negative for adenopathy. Bruises/bleeds easily.  Psychiatric/Behavioral: Negative for dysphoric mood, sleep disturbance (wearing CPAP at night. sleeping on 1 pillow) and suicidal ideas. The patient is not nervous/anxious.    Vitals:   01/12/18 0842 01/12/18 0900  BP: (!) 98/45 100/60  Pulse: 74   Resp: 18   SpO2: 95%   Weight: 270 lb (122.5 kg)   Height: 6\' 2"  (1.88 m)    Wt Readings from Last 3 Encounters:  01/12/18 270 lb (122.5 kg)  11/18/17 264 lb (119.7 kg)  10/24/17 270 lb 12.8 oz  (122.8 kg)   Lab Results  Component Value Date   CREATININE 1.27 06/10/2017   CREATININE 1.07 04/19/2016   CREATININE 1.12 01/19/2016    Physical Exam  Constitutional: He is oriented to person, place, and time. He appears well-developed and well-nourished.  HENT:  Head: Normocephalic and atraumatic.  Eyes: Conjunctivae are normal. Pupils are equal, round, and reactive to light.  Neck: Normal range of motion. Neck supple. No JVD present.  Cardiovascular: Normal rate. An irregular rhythm present.  Pulmonary/Chest: Effort normal. He has no wheezes. He has no rales.  Abdominal: Soft. He exhibits no distension. There is no tenderness.  Musculoskeletal: He exhibits edema (trace pitting edema in left lower leg). He exhibits no tenderness.  Neurological: He is alert and oriented to person, place, and time.  Skin: Skin is warm  and dry.  Psychiatric: He has a normal mood and affect. His behavior is normal. Thought content normal.  Nursing note and vitals reviewed.  Assessment & Plan:  1: Chronic heart failure with reduced ejection fraction- - NYHA class II - euvolemic today - weighing daily at home. Reminded to call for an overnight weight gain of >2 pounds or a weekly weight gain of >5 pounds - not adding salt and trying to follow a 2000mg  sodium diet - cardiologist wants to start entresto and patient is going to begin entresto once prior approval is received. Currently remains on lisinopril - saw his cardiologist Nehemiah Massed) 07/29/17 - BNP from 01/16/16 was 700.0 - patient reports receiving his flu vaccine for this season - sees pulmonologist Alva Garnet) 01/27/18  2: Hypotension- - BP on the low side today. Patient says that he took all his medications but hasn't eaten anything yet - may be difficult to titrate up toprol due to hypotension - BMP from 06/20/17 reviewed and shows sodium 138, potassium 4.8 and GFR 58.64  3: Atrial fibrillation- - currently rate controlled  - on eliquis and  toprol XL  Patient did not bring his medications nor a list. Each medication was verbally reviewed with the patient and he was encouraged to bring the bottles to every visit to confirm accuracy of list.  Return in 6 months or sooner for any questions/problems before then.

## 2018-01-12 ENCOUNTER — Ambulatory Visit: Payer: Medicare Other | Admitting: Family

## 2018-01-12 ENCOUNTER — Ambulatory Visit: Payer: Medicare Other | Attending: Family | Admitting: Family

## 2018-01-12 ENCOUNTER — Encounter: Payer: Self-pay | Admitting: Family

## 2018-01-12 VITALS — BP 100/60 | HR 74 | Resp 18 | Ht 74.0 in | Wt 270.0 lb

## 2018-01-12 DIAGNOSIS — Z8546 Personal history of malignant neoplasm of prostate: Secondary | ICD-10-CM | POA: Insufficient documentation

## 2018-01-12 DIAGNOSIS — Z7901 Long term (current) use of anticoagulants: Secondary | ICD-10-CM | POA: Insufficient documentation

## 2018-01-12 DIAGNOSIS — D696 Thrombocytopenia, unspecified: Secondary | ICD-10-CM | POA: Insufficient documentation

## 2018-01-12 DIAGNOSIS — I5022 Chronic systolic (congestive) heart failure: Secondary | ICD-10-CM | POA: Diagnosis not present

## 2018-01-12 DIAGNOSIS — Z95810 Presence of automatic (implantable) cardiac defibrillator: Secondary | ICD-10-CM | POA: Insufficient documentation

## 2018-01-12 DIAGNOSIS — Z79899 Other long term (current) drug therapy: Secondary | ICD-10-CM | POA: Diagnosis not present

## 2018-01-12 DIAGNOSIS — I2510 Atherosclerotic heart disease of native coronary artery without angina pectoris: Secondary | ICD-10-CM | POA: Diagnosis not present

## 2018-01-12 DIAGNOSIS — I110 Hypertensive heart disease with heart failure: Secondary | ICD-10-CM | POA: Diagnosis not present

## 2018-01-12 DIAGNOSIS — J449 Chronic obstructive pulmonary disease, unspecified: Secondary | ICD-10-CM | POA: Insufficient documentation

## 2018-01-12 DIAGNOSIS — I255 Ischemic cardiomyopathy: Secondary | ICD-10-CM | POA: Insufficient documentation

## 2018-01-12 DIAGNOSIS — I4891 Unspecified atrial fibrillation: Secondary | ICD-10-CM | POA: Diagnosis not present

## 2018-01-12 DIAGNOSIS — I959 Hypotension, unspecified: Secondary | ICD-10-CM | POA: Insufficient documentation

## 2018-01-12 DIAGNOSIS — I252 Old myocardial infarction: Secondary | ICD-10-CM | POA: Insufficient documentation

## 2018-01-12 DIAGNOSIS — E785 Hyperlipidemia, unspecified: Secondary | ICD-10-CM | POA: Insufficient documentation

## 2018-01-12 DIAGNOSIS — K219 Gastro-esophageal reflux disease without esophagitis: Secondary | ICD-10-CM | POA: Diagnosis not present

## 2018-01-12 DIAGNOSIS — I482 Chronic atrial fibrillation, unspecified: Secondary | ICD-10-CM

## 2018-01-12 DIAGNOSIS — I950 Idiopathic hypotension: Secondary | ICD-10-CM

## 2018-01-12 DIAGNOSIS — G4733 Obstructive sleep apnea (adult) (pediatric): Secondary | ICD-10-CM | POA: Diagnosis not present

## 2018-01-12 NOTE — Patient Instructions (Signed)
Continue weighing daily and call for an overnight weight gain of > 2 pounds or a weekly weight gain of >5 pounds. 

## 2018-01-27 ENCOUNTER — Ambulatory Visit: Payer: Medicare Other | Admitting: Pulmonary Disease

## 2018-01-30 ENCOUNTER — Encounter: Payer: Self-pay | Admitting: Pulmonary Disease

## 2018-01-30 DIAGNOSIS — I25810 Atherosclerosis of coronary artery bypass graft(s) without angina pectoris: Secondary | ICD-10-CM | POA: Diagnosis not present

## 2018-01-30 DIAGNOSIS — I4430 Unspecified atrioventricular block: Secondary | ICD-10-CM | POA: Diagnosis not present

## 2018-01-30 DIAGNOSIS — I10 Essential (primary) hypertension: Secondary | ICD-10-CM | POA: Diagnosis not present

## 2018-01-30 DIAGNOSIS — I5022 Chronic systolic (congestive) heart failure: Secondary | ICD-10-CM | POA: Diagnosis not present

## 2018-02-02 ENCOUNTER — Encounter: Payer: Self-pay | Admitting: Pulmonary Disease

## 2018-02-02 ENCOUNTER — Ambulatory Visit (INDEPENDENT_AMBULATORY_CARE_PROVIDER_SITE_OTHER): Payer: Medicare Other | Admitting: Pulmonary Disease

## 2018-02-02 VITALS — BP 102/60 | HR 82 | Resp 16 | Ht 75.0 in | Wt 271.0 lb

## 2018-02-02 DIAGNOSIS — E668 Other obesity: Secondary | ICD-10-CM | POA: Diagnosis not present

## 2018-02-02 DIAGNOSIS — G4733 Obstructive sleep apnea (adult) (pediatric): Secondary | ICD-10-CM

## 2018-02-02 DIAGNOSIS — J449 Chronic obstructive pulmonary disease, unspecified: Secondary | ICD-10-CM | POA: Diagnosis not present

## 2018-02-02 NOTE — Progress Notes (Signed)
PULMONARY OFFICE FOLLOW UP NOTE  PROBLEMS:  COPD OSA Ischemic cardiomyopathy Chronic AF  DATA: Sleep studies (2011): Moderate obstructive sleep apnea. Recommended CPAP 12 cm H2O CT chest (10/07/14): Findings consistent with congestive heart failure and pulmonary edema PFTs (01/29/16): FVC: 3.38 > 3.92 L (68 > 79 %pred), FEV1: 2.29 >2.89 L (63 > 80 %pred), FEV1/FVC:68 >74%, TLC:  6.60 L (84  %pred), DLCO 51% pred Spirometry (05/05/17): 2.64 FVC:  L (50 to %pred), FEV1: 1.72 L (46 %pred), FEV1/FVC: 65% CPAP compliance 05/01-05/30/18: CPAP worn 30/30 nights, greater than 4 hours 30/30 nights. AHI greater than 10/hr 10 out of 30 nights CPAP titration 05/26/17: recommend autoset 12-20 cm H2O with O2 1 LPM bleed in CPAP compliance 01/28-02/26/19: CPAP worn 30/30 nights, greater than 4 hours 30/30 nights. AHI greater than 10/hr 3 out of 30 nights. Median AHI 5.8   INTERVAL HISTORY: No major events  SUBJ: This is a routine reevaluation.  He has no new complaints.  He is wearing the CPAP compliantly.  He believes that it has benefited him.  He denies significant daytime hypersomnolence.  He has moderate exertional dyspnea (class II) with little day-to-day variance.  He remains on Anoro inhaler. Denies CP, orthopnea, PND, fever, purulent sputum, hemoptysis, increasing LE edema and calf tenderness.   OBJ: Vitals:   02/02/18 0805 02/02/18 0817  BP:  102/60  Pulse:  82  Resp: 16   SpO2:  94%  Weight: 122.9 kg (271 lb)   Height: 6\' 3"  (1.905 m)   RA  Gen: NAD HEENT: WNL Neck: No LAN, no JVD noted Lungs: Breath sounds are full without adventitious sounds Cardiovascular: His rhythm seems regular today with no M noted Abdomen: Soft, NT, +BS Ext: no C/C/E. Chronic stasis changes present bilaterally Neuro: No focal deficits on limited exam   DATA: BMP Latest Ref Rng & Units 06/10/2017 04/19/2016 01/19/2016  Glucose 70 - 99 mg/dL 104(H) 109(H) 110(H)  BUN 6 - 23 mg/dL 21 20 33(H)   Creatinine 0.40 - 1.50 mg/dL 1.27 1.07 1.12  Sodium 135 - 145 mEq/L 138 140 137  Potassium 3.5 - 5.1 mEq/L 4.8 4.0 3.9  Chloride 96 - 112 mEq/L 102 104 105  CO2 19 - 32 mEq/L 30 30 28   Calcium 8.4 - 10.5 mg/dL 9.5 9.1 8.6(L)    CBC Latest Ref Rng & Units 06/10/2017 01/19/2016 01/17/2016  WBC 4.0 - 10.5 K/uL 4.9 3.9 6.2  Hemoglobin 13.0 - 17.0 g/dL 12.2(L) 10.6(L) 11.2(L)  Hematocrit 39.0 - 52.0 % 36.8(L) 31.5(L) 33.8(L)  Platelets 150.0 - 400.0 K/uL 109.0(L) 83(L) 74(L)    No new CXR    IMPRESSION: Moderate OSA -he remains compliant with CPAP and is benefiting from it Moderate COPD, never smoker - Mixed pattern of obstruction and restriction on spirometry  Ischemic cardiomyopathy with chronic (?  Paroxysmal) AF Moderate obesity  PLAN: Continue current settings on CPAP (setting is AutoSet 12-20 cm H2O) Continue Anoro inhaler We discussed weight loss strategies and its potential benefits Follow-up with me in 1 year or sooner as needed   Merton Border, MD PCCM service Mobile [phone] Pager [phone] 02/02/2018.8:52 AM

## 2018-02-02 NOTE — Patient Instructions (Signed)
Continue CPAP with sleep Continue Anoro inhaler We discussed weight loss and its benefits Follow-up in 1 year or sooner as needed for any sleep or breathing problems

## 2018-02-22 ENCOUNTER — Other Ambulatory Visit: Payer: Self-pay | Admitting: Internal Medicine

## 2018-02-22 NOTE — Progress Notes (Unsigned)
Patient tolerated well.

## 2018-02-22 NOTE — Telephone Encounter (Signed)
Refilled: 05/30/2017 Last OV: 09/27/2016 Next OV: not scheduled

## 2018-02-22 NOTE — Telephone Encounter (Signed)
DENIED,  PLEASE TELL PATIENT HE NEEDS TO GET FROM CARDIOLOGY SINCE HE HAS NOT SEEN ME  SINCE OCT 2017

## 2018-02-23 NOTE — Telephone Encounter (Signed)
Notified pt. He understood

## 2018-02-23 NOTE — Telephone Encounter (Signed)
LMTCB. PEC may speak with pt.  

## 2018-03-09 ENCOUNTER — Ambulatory Visit: Payer: Medicare Other | Admitting: Podiatry

## 2018-03-13 ENCOUNTER — Ambulatory Visit (INDEPENDENT_AMBULATORY_CARE_PROVIDER_SITE_OTHER): Payer: Medicare Other | Admitting: Podiatry

## 2018-03-13 ENCOUNTER — Encounter: Payer: Self-pay | Admitting: Podiatry

## 2018-03-13 DIAGNOSIS — M79676 Pain in unspecified toe(s): Secondary | ICD-10-CM

## 2018-03-13 DIAGNOSIS — B351 Tinea unguium: Secondary | ICD-10-CM | POA: Diagnosis not present

## 2018-03-13 DIAGNOSIS — D689 Coagulation defect, unspecified: Secondary | ICD-10-CM

## 2018-03-13 NOTE — Progress Notes (Signed)
This patient presents the office with chief complaint of long thick nails  He states the nails are painful walking and wearing his shoes. He is unable to self treat. He  presents the office today for an evaluation and treatment of his nails and  He has a history of gout.  No pain from callus right forefoot today.  Patient is taking eliquiss.  GENERAL APPEARANCE: Alert, conversant. Appropriately groomed. No acute distress.  VASCULAR: Pedal pulses are  palpable at  [facility] and PT bilateral.  Capillary refill time is immediate to all digits,  Normal temperature gradient.  Digital hair growth is present bilateral  NEUROLOGIC: sensation is normal to 5.07 monofilament at 5/5 sites bilateral.  Light touch is intact bilateral, Muscle strength normal.  MUSCULOSKELETAL: acceptable muscle strength, tone and stability bilateral.  Intrinsic muscluature intact bilateral.  Rectus appearance of foot .  Severe hammer toes 1-5  B/L   DERMATOLOGIC: skin color, texture, and turgor are within normal limits.  No preulcerative lesions or ulcers  are seen, no interdigital maceration noted.  No open lesions present.   NAILS  thick disfigured discolored hallux toenails, both feet   Onychomycosis hallux nails    Debridement of nails. RTC  3 months for preventive foot care services.  ABN signed for 2019.     Gardiner Barefoot DPM

## 2018-03-16 ENCOUNTER — Other Ambulatory Visit: Payer: Self-pay | Admitting: Internal Medicine

## 2018-03-27 ENCOUNTER — Other Ambulatory Visit: Payer: Self-pay | Admitting: Internal Medicine

## 2018-03-30 ENCOUNTER — Other Ambulatory Visit: Payer: Self-pay | Admitting: Pulmonary Disease

## 2018-04-03 ENCOUNTER — Other Ambulatory Visit: Payer: Self-pay | Admitting: Internal Medicine

## 2018-04-05 NOTE — Telephone Encounter (Signed)
Refilled: 10/03/2017 Last OV: 09/27/2016 Next OV: not scheduled

## 2018-04-12 DIAGNOSIS — I482 Chronic atrial fibrillation: Secondary | ICD-10-CM | POA: Diagnosis not present

## 2018-04-19 ENCOUNTER — Encounter: Payer: Self-pay | Admitting: Internal Medicine

## 2018-04-19 ENCOUNTER — Ambulatory Visit (INDEPENDENT_AMBULATORY_CARE_PROVIDER_SITE_OTHER): Payer: Medicare Other | Admitting: Internal Medicine

## 2018-04-19 VITALS — BP 110/64 | HR 86 | Temp 98.1°F | Resp 15 | Ht 75.0 in | Wt 275.0 lb

## 2018-04-19 DIAGNOSIS — H9312 Tinnitus, left ear: Secondary | ICD-10-CM | POA: Diagnosis not present

## 2018-04-19 DIAGNOSIS — J439 Emphysema, unspecified: Secondary | ICD-10-CM | POA: Diagnosis not present

## 2018-04-19 MED ORDER — TELMISARTAN 20 MG PO TABS
20.0000 mg | ORAL_TABLET | Freq: Every day | ORAL | 1 refills | Status: DC
Start: 2018-04-19 — End: 2018-04-19

## 2018-04-19 NOTE — Progress Notes (Signed)
\  Subjective:  Patient ID: Rick Gilmore, male    DOB: 08/21/1941  Age: 77 y.o. MRN: [id number]  CC: The primary encounter diagnosis was Tinnitus aurium, left. A diagnosis of Pulmonary emphysema, unspecified emphysema type (Rick Gilmore) was also pertinent to this visit.  HPI Rick Gilmore presents for  Evaluation of "buzzing"   In his left ear that has been occurring for the past week. He also reports that the silicone tips of his hearing aides become retained in both ear canals and had to be extracted  by [facility] hearing aid specilaist with tweezers on Monday  Medications reviewed.  He Takes ACE inhibitor and a loop diuretic  Occupational/recreational activities  Reviewed:  Frequent use of power drills,  Leaf blowers,  Does not use protective ear ware   Outpatient Medications Prior to Visit  Medication Sig Dispense Refill  . allopurinol (ZYLOPRIM) 100 MG tablet TAKE 1 TABLET DAILY 90 tablet 3  . ANORO ELLIPTA 62.5-25 MCG/INH AEPB USE 1 INHALATION DAILY 180 each 0  . apixaban (ELIQUIS) 5 MG TABS tablet Take by mouth.    . colchicine 0.6 MG tablet Take 0.6 mg by mouth daily as needed (for gout flares).     . Cyanocobalamin 1000 MCG SUBL Place 1 tablet (1,000 mcg total) under the tongue every morning. 90 tablet 3  . ENTRESTO 24-26 MG     . escitalopram (LEXAPRO) 10 MG tablet TAKE 1 TABLET DAILY 90 tablet 1  . fluticasone (FLONASE) 50 MCG/ACT nasal spray Place 1 spray into both nostrils daily. 48 g 3  . isosorbide mononitrate (IMDUR) 30 MG 24 hr tablet Take 30 mg by mouth daily.  11  . KLOR-CON M20 20 MEQ tablet TAKE 1 TABLET DAILY 90 tablet 1  . montelukast (SINGULAIR) 10 MG tablet TAKE 1 TABLET AT BEDTIME 90 tablet 1  . pantoprazole (PROTONIX) 40 MG tablet TAKE 1 TABLET DAILY 90 tablet 3  . TOPROL XL 100 MG 24 hr tablet TAKE 1 TABLET DAILY WITH OR IMMEDIATELY FOLLOWING A MEAL 90 tablet 3  . torsemide (DEMADEX) 20 MG tablet TAKE 3 TABLETS IN THE MORNING AND 2 TABLETS IN THE EVENING 450 tablet 3  .  lisinopril (PRINIVIL,ZESTRIL) 5 MG tablet Take 5 mg by mouth daily.    Marland Kitchen ELIQUIS 5 MG TABS tablet TAKE 1 TABLET TWICE A DAY (Patient not taking: Reported on 04/19/2018) 180 tablet 2   No facility-administered medications prior to visit.     Review of Systems;  Patient denies headache, fevers, malaise, unintentional weight loss, skin rash, eye pain, sinus congestion and sinus pain, sore throat, dysphagia,  hemoptysis , cough, dyspnea, wheezing, chest pain, palpitations, orthopnea, edema, abdominal pain, nausea, melena, diarrhea, constipation, flank pain, dysuria, hematuria, urinary  Frequency, nocturia, numbness, tingling, seizures,  Focal weakness, Loss of consciousness,  Tremor, insomnia, depression, anxiety, and suicidal ideation.      Objective:  BP 110/64 (BP Location: Left Arm, Patient Position: Sitting, Cuff Size: Large)   Pulse 86   Temp 98.1 F (36.7 C) (Oral)   Resp 15   Ht 6\' 3"  (1.905 m)   Wt 275 lb (124.7 kg)   SpO2 94%   BMI 34.37 kg/m   BP Readings from Last 3 Encounters:  04/19/18 110/64  02/02/18 102/60  01/12/18 100/60    Wt Readings from Last 3 Encounters:  04/19/18 275 lb (124.7 kg)  02/02/18 271 lb (122.9 kg)  01/12/18 270 lb (122.5 kg)    General appearance: alert, cooperative and appears stated  age Ears: normal TM's and external ear canals both ears except for signs of recent excoriationof right canal Throat: lips, mucosa, and tongue normal; teeth and gums normal Neck: no adenopathy, no carotid bruit, supple, symmetrical, trachea midline and thyroid not enlarged, symmetric, no tenderness/mass/nodules Lungs: clear to auscultation bilaterally   Lab Results  Component Value Date   HGBA1C 5.7 06/10/2017   HGBA1C 5.1 01/16/2016   HGBA1C 5.9 11/23/2014    Lab Results  Component Value Date   CREATININE 1.27 06/10/2017   CREATININE 1.07 04/19/2016   CREATININE 1.12 01/19/2016    Lab Results  Component Value Date   WBC 4.9 06/10/2017   HGB 12.2  (L) 06/10/2017   HCT 36.8 (L) 06/10/2017   PLT 109.0 (L) 06/10/2017   GLUCOSE 104 (H) 06/10/2017   CHOL 143 06/10/2017   TRIG 95.0 06/10/2017   HDL 35.30 (L) 06/10/2017   LDLDIRECT 59.9 11/05/2011   LDLCALC 89 06/10/2017   ALT 9 06/10/2017   AST 15 06/10/2017   NA 138 06/10/2017   K 4.8 06/10/2017   CL 102 06/10/2017   CREATININE 1.27 06/10/2017   BUN 21 06/10/2017   CO2 30 06/10/2017   TSH 1.82 01/22/2015   INR 2.3 06/17/2014   HGBA1C 5.7 06/10/2017    No results found.  Assessment & Plan:   Problem List Items Addressed This Visit    Tinnitus aurium, left - Primary    Stopping ACE Inhibitor and starting ARB.  Advised to use protective ear wear when using power tools.  ENT referral       Relevant Orders   Ambulatory referral to ENT   COPD (chronic obstructive pulmonary disease) (HCC)    Mild,  Complicated by OSA.  Managed by Pulmonology. No recent exacerbations       A total of 25 minutes of face to face time was spent with patient more than half of which was spent in counselling about the above mentioned conditions  and coordination of care   I have discontinued Rick Gilmore's lisinopril, ELIQUIS, and telmisartan. I am also having him maintain his isosorbide mononitrate, colchicine, pantoprazole, fluticasone, Cyanocobalamin, TOPROL XL, allopurinol, torsemide, KLOR-CON M20, escitalopram, apixaban, montelukast, ANORO ELLIPTA, and ENTRESTO.  Meds ordered this encounter  Medications  . DISCONTD: telmisartan (MICARDIS) 20 MG tablet    Sig: Take 1 tablet (20 mg total) by mouth daily.    Dispense:  90 tablet    Refill:  1    Medications Discontinued During This Encounter  Medication Reason  . ELIQUIS 5 MG TABS tablet Duplicate  . lisinopril (PRINIVIL,ZESTRIL) 5 MG tablet   . telmisartan (MICARDIS) 20 MG tablet     Follow-up: No follow-ups on file.   Crecencio Mc, MD

## 2018-04-19 NOTE — Patient Instructions (Addendum)
I recommend stopping the lisinopril  Since lisinopril is on hte list of drugs tha can cause tinnitus  I recommend that you protect your ears from loud noises (powered drills, saws,  Leafblowers) etc  Referral to [facility] in progress   Tinnitus Tinnitus refers to hearing a sound when there is no actual source for that sound. This is often described as ringing in the ears. However, people with this condition may hear a variety of noises. A person may hear the sound in one ear or in both ears. The sounds of tinnitus can be soft, loud, or somewhere in between. Tinnitus can last for a few seconds or can be constant for days. It may go away without treatment and come back at various times. When tinnitus is constant or happens often, it can lead to other problems, such as trouble sleeping and trouble concentrating. Almost everyone experiences tinnitus at some point. Tinnitus that is long-lasting (chronic) or comes back often is a problem that may require medical attention. What are the causes? The cause of tinnitus is often not known. In some cases, it can result from other problems or conditions, including:  Exposure to loud noises from machinery, music, or other sources.  Hearing loss.  Ear or sinus infections.  Earwax buildup.  A foreign object in the ear.  Use of certain medicines.  Use of alcohol and caffeine.  High blood pressure.  Heart diseases.  Anemia.  Allergies.  Meniere disease.  Thyroid problems.  Tumors.  An enlarged part of a weakened blood vessel (aneurysm).  What are the signs or symptoms? The main symptom of tinnitus is hearing a sound when there is no source for that sound. It may sound like:  Buzzing.  Roaring.  Ringing.  Blowing air, similar to the sound heard when you listen to a seashell.  Hissing.  Whistling.  Sizzling.  Humming.  Running water.  A sustained musical note.  How is this diagnosed? Tinnitus is diagnosed based on  your symptoms. Your health care provider will do a physical exam. A comprehensive hearing exam (audiologic exam) will be done if your tinnitus:  Affects only one ear (unilateral).  Causes hearing difficulties.  Lasts 6 months or longer.  You may also need to see a health care provider who specializes in hearing disorders (audiologist). You may be asked to complete a questionnaire to determine the severity of your tinnitus. Tests may be done to help determine the cause and to rule out other conditions. These can include:  Imaging studies of your head and brain, such as: ? A CT scan. ? An MRI.  An imaging study of your blood vessels (angiogram).  How is this treated? Treating an underlying medical condition can sometimes make tinnitus go away. If your tinnitus continues, other treatments may include:  Medicines, such as certain antidepressants or sleeping aids.  Sound generators to mask the tinnitus. These include: ? Tabletop sound machines that play relaxing sounds to help you fall asleep. ? Wearable devices that fit in your ear and play sounds or music. ? A small device that uses headphones to deliver a signal embedded in music (acoustic neural stimulation). In time, this may change the pathways of your brain and make you less sensitive to tinnitus. This device is used for very severe cases when no other treatment is working.  Therapy and counseling to help you manage the stress of living with tinnitus.  Using hearing aids or cochlear implants, if your tinnitus is related to  hearing loss.  Follow these instructions at home:  When possible, avoid being in loud places and being exposed to loud sounds.  Wear hearing protection, such as earplugs, when you are exposed to loud noises.  Do not take stimulants, such as nicotine, alcohol, or caffeine.  Practice techniques for reducing stress, such as meditation, yoga, or deep breathing.  Use a white noise machine, a humidifier, or  other devices to mask the sound of tinnitus.  Sleep with your head slightly raised. This may reduce the impact of tinnitus.  Try to get plenty of rest each night. Contact a health care provider if:  You have tinnitus in just one ear.  Your tinnitus continues for 3 weeks or longer without stopping.  Home care measures are not helping.  You have tinnitus after a head injury.  You have tinnitus along with any of the following: ? Dizziness. ? Loss of balance. ? Nausea and vomiting. This information is not intended to replace advice given to you by your health care provider. Make sure you discuss any questions you have with your health care provider. Document Released: 11/22/2005 Document Revised: 07/25/2016 Document Reviewed: 04/24/2014 Elsevier Interactive Patient Education  2018 [employer].

## 2018-04-22 ENCOUNTER — Encounter: Payer: Self-pay | Admitting: Internal Medicine

## 2018-04-22 DIAGNOSIS — H9312 Tinnitus, left ear: Secondary | ICD-10-CM | POA: Insufficient documentation

## 2018-04-22 NOTE — Assessment & Plan Note (Signed)
Stopping ACE Inhibitor and starting ARB.  Advised to use protective ear wear when using power tools.  ENT referral

## 2018-04-22 NOTE — Assessment & Plan Note (Signed)
Mild,  Complicated by OSA.  Managed by Pulmonology. No recent exacerbations 

## 2018-05-13 ENCOUNTER — Other Ambulatory Visit: Payer: Self-pay | Admitting: Pulmonary Disease

## 2018-05-15 ENCOUNTER — Encounter: Payer: Self-pay | Admitting: Podiatry

## 2018-05-15 ENCOUNTER — Ambulatory Visit (INDEPENDENT_AMBULATORY_CARE_PROVIDER_SITE_OTHER): Payer: Medicare Other | Admitting: Podiatry

## 2018-05-15 DIAGNOSIS — B351 Tinea unguium: Secondary | ICD-10-CM | POA: Diagnosis not present

## 2018-05-15 DIAGNOSIS — M79676 Pain in unspecified toe(s): Secondary | ICD-10-CM

## 2018-05-15 DIAGNOSIS — D689 Coagulation defect, unspecified: Secondary | ICD-10-CM

## 2018-05-15 NOTE — Progress Notes (Signed)
This patient presents the office with chief complaint of long thick nails  He states the nails are painful walking and wearing his shoes. He is unable to self treat. He  presents the office today for an evaluation and treatment of his nails and  He has a history of gout.  No pain from callus right forefoot today.  Patient is taking eliquiss.  GENERAL APPEARANCE: Alert, conversant. Appropriately groomed. No acute distress.  VASCULAR: Pedal pulses are  palpable at  [facility] and PT bilateral.  Capillary refill time is immediate to all digits,  Normal temperature gradient.  Digital hair growth is present bilateral  NEUROLOGIC: sensation is normal to 5.07 monofilament at 5/5 sites bilateral.  Light touch is intact bilateral, Muscle strength normal.  MUSCULOSKELETAL: acceptable muscle strength, tone and stability bilateral.  Intrinsic muscluature intact bilateral.  Rectus appearance of foot .  Severe hammer toes 1-5  B/L   DERMATOLOGIC: skin color, texture, and turgor are within normal limits.  No preulcerative lesions or ulcers  are seen, no interdigital maceration noted.  No open lesions present.   NAILS  thick disfigured discolored hallux toenails, both feet   Onychomycosis hallux nails    Debridement of nails. RTC  3 months for preventive foot care services.  ABN signed for 2019.     Gardiner Barefoot DPM

## 2018-05-23 ENCOUNTER — Other Ambulatory Visit: Payer: Self-pay | Admitting: Internal Medicine

## 2018-05-30 DIAGNOSIS — H9319 Tinnitus, unspecified ear: Secondary | ICD-10-CM | POA: Diagnosis not present

## 2018-05-30 DIAGNOSIS — H6980 Other specified disorders of Eustachian tube, unspecified ear: Secondary | ICD-10-CM | POA: Diagnosis not present

## 2018-05-30 DIAGNOSIS — H903 Sensorineural hearing loss, bilateral: Secondary | ICD-10-CM | POA: Diagnosis not present

## 2018-05-30 DIAGNOSIS — H9313 Tinnitus, bilateral: Secondary | ICD-10-CM | POA: Diagnosis not present

## 2018-05-30 DIAGNOSIS — H6122 Impacted cerumen, left ear: Secondary | ICD-10-CM | POA: Diagnosis not present

## 2018-06-28 ENCOUNTER — Other Ambulatory Visit: Payer: Self-pay | Admitting: Pulmonary Disease

## 2018-06-29 ENCOUNTER — Other Ambulatory Visit: Payer: Self-pay | Admitting: Family

## 2018-06-30 DIAGNOSIS — H9319 Tinnitus, unspecified ear: Secondary | ICD-10-CM | POA: Diagnosis not present

## 2018-06-30 DIAGNOSIS — H903 Sensorineural hearing loss, bilateral: Secondary | ICD-10-CM | POA: Diagnosis not present

## 2018-06-30 DIAGNOSIS — H6980 Other specified disorders of Eustachian tube, unspecified ear: Secondary | ICD-10-CM | POA: Diagnosis not present

## 2018-07-12 ENCOUNTER — Ambulatory Visit: Payer: Medicare Other | Admitting: Family

## 2018-07-19 NOTE — Progress Notes (Signed)
Patient ID: Rick Gilmore, male    DOB: 10-08-1941, 77 y.o.   MRN: [id number]  HPI  Rick Gilmore is a 77 y/o male with a history of thrombocytopenia, obstructive sleep apnea (with CPAP), prostate cancer, MI, HTN, hyperlipidemia, GERD, COPD, atrial fibrillation, anemia, AICD and chronic heart failure.   Reviewed echo done 07/04/17 which showed an EF of 30% along with severe Rick/TR. Previous echo was done 09/02/16 and showed an EF of 25% along with severe Rick/TR. EF has decreased from 01/16/16 which showed an EF of 30-35% along with mild Rick/TR.   Has not been admitted or been in the ED in the last 6 months.  He presents today for his follow-up visit with a chief complaint of minimal fatigue upon moderate exertion. He says that this has been present for many years. He has associated tinnitus and easy bruising along with this. He denies any difficulty sleeping, abdominal distention, palpitations, pedal edema, chest pain, shortness of breath, cough, dizziness or weight gain. Has seen ENT regarding tinnitus and is now using more sprays of his nasal spray.   Past Medical History:  Diagnosis Date  . AICD (automatic cardioverter/defibrillator) present   . Anemia   . Arrhythmia    a fib  . Atrial fibrillation ([place])    on Coumadin  . CAD (coronary artery disease)   . Cancer [facility]) 2001   Prostate, XRT and implant  . CHF (congestive heart failure) ([place])   . COPD (chronic obstructive pulmonary disease) ([place])   . Erectile dysfunction   . GERD (gastroesophageal reflux disease)   . Hyperlipidemia   . Hypertension   . Ischemic cardiomyopathy    s/p AICD/pacer 2009, replaced 2010 [facility]  . MI (myocardial infarction) ([place]) 1988  . Personal history of prostate cancer 2001   s/p XRT and implant ([place])  . Prostate cancer ([place])   . Sleep apnea, obstructive    uses CPAP nightly  . Thrombocytopenia ([place])   . Tubular adenoma    colon polyp  . Vitamin B 12 deficiency    Past Surgical History:  Procedure  Laterality Date  . CARDIAC CATHETERIZATION    . COLONOSCOPY WITH PROPOFOL [name] 05/30/2015   Procedure: COLONOSCOPY WITH PROPOFOL;  Surgeon: Manya Silvas, MD;  Location[facility] ENDOSCOPY;  Service: Endoscopy;  Laterality: [name];  . CORONARY ARTERY BYPASS GRAFT  1988  . ESOPHAGOGASTRODUODENOSCOPY [name] 05/30/2015   Procedure: ESOPHAGOGASTRODUODENOSCOPY (EGD);  Surgeon: Manya Silvas, MD;  Location: [facility] ENDOSCOPY;  Service: Endoscopy;  Laterality: [name];  . IMPLANTABLE CARDIOVERTER DEFIBRILLATOR GENERATOR CHANGE  April 2014   Bi V RRR  . INSERT / REPLACE / REMOVE PACEMAKER  2014  . VASECTOMY     Family History  Problem Relation Age of Onset  . Hypertension Mother   . Hypertension Father   . Cancer Father        prostate, throat  . Emphysema Father   . Heart disease Maternal Grandmother   . Heart disease Paternal Grandmother    Social History   Tobacco Use  . Smoking status: Never Smoker  . Smokeless tobacco: Never Used  Substance Use Topics  . Alcohol use: Yes    Alcohol/week: 4.0 standard drinks    Types: 2 Standard drinks or equivalent, 2 Cans of beer per week    Comment: Mixed drinks   No Known Allergies  Prior to Admission medications   Medication Sig Start Date End Date Taking? Authorizing Provider  allopurinol (ZYLOPRIM) 100 MG tablet TAKE 1 TABLET  DAILY 06/29/18  Yes Alisa Graff, FNP  ANORO ELLIPTA 62.5-25 MCG/INH AEPB USE 1 INHALATION DAILY 06/28/18  Yes Wilhelmina Mcardle, MD  apixaban Arne [place]) 5 MG TABS tablet Take by mouth. 02/24/18  Yes [provider]  colchicine 0.6 MG tablet Take 0.6 mg by mouth daily as needed (for gout flares).    Yes [provider]  Cyanocobalamin 1000 MCG SUBL Place 1 tablet (1,000 mcg total) under the tongue every morning. 06/15/17  Yes Crecencio Mc, MD  ENTRESTO 24-26 MG  04/11/18  Yes [provider]  escitalopram (LEXAPRO) 10 MG tablet TAKE 1 TABLET DAILY 05/23/18  Yes Crecencio Mc, MD  fluticasone (FLONASE)  50 MCG/ACT nasal spray USE 1 SPRAY IN EACH NOSTRIL DAILY Patient taking differently: 2 sprays.  05/15/18  Yes Wilhelmina Mcardle, MD  isosorbide mononitrate (IMDUR) 30 MG 24 hr tablet Take 30 mg by mouth daily.   Yes [provider]  KLOR-CON M20 20 MEQ tablet TAKE 1 TABLET DAILY 10/03/17  Yes Crecencio Mc, MD  montelukast (SINGULAIR) 10 MG tablet Take 1 tablet (10 mg total) by mouth at bedtime. 07/20/18  Yes Darylene Price A, FNP  pantoprazole (PROTONIX) 40 MG tablet TAKE 1 TABLET DAILY 04/01/17  Yes Crecencio Mc, MD  TOPROL XL 100 MG 24 hr tablet TAKE 1 TABLET DAILY WITH OR IMMEDIATELY FOLLOWING A MEAL 06/29/18  Yes Darylene Price A, FNP  torsemide (DEMADEX) 20 MG tablet TAKE 3 TABLETS IN THE MORNING AND 2 TABLETS IN THE EVENING 06/29/18  Yes Alisa Graff, FNP    Review of Systems  Constitutional: Positive for fatigue (minimal). Negative for appetite change and fever.  HENT: Positive for tinnitus. Negative for congestion, ear pain, postnasal drip, sinus pressure and sore throat.   Eyes: Negative.   Respiratory: Negative for cough, chest tightness and shortness of breath.   Cardiovascular: Negative for chest pain, palpitations and leg swelling.  Gastrointestinal: Negative for abdominal distention and abdominal pain.  Endocrine: Negative.   Genitourinary: Negative.   Musculoskeletal: Negative for back pain and neck pain.  Skin: Negative.   Allergic/Immunologic: Negative.   Neurological: Negative for dizziness and light-headedness.  Hematological: Negative for adenopathy. Bruises/bleeds easily.  Psychiatric/Behavioral: Negative for dysphoric mood, sleep disturbance (wearing CPAP at night. sleeping on 1 pillow) and suicidal ideas. The patient is not nervous/anxious.    Vitals:   07/20/18 1141  BP: (!) 106/53  Pulse: 90  Resp: 18  SpO2: 98%  Weight: 265 lb 8 oz (120.4 kg)  Height: 6\' 3"  (1.905 m)   Wt Readings from Last 3 Encounters:  07/20/18 265 lb 8 oz (120.4 kg)   04/19/18 275 lb (124.7 kg)  02/02/18 271 lb (122.9 kg)   Lab Results  Component Value Date   CREATININE 1.27 06/10/2017   CREATININE 1.07 04/19/2016   CREATININE 1.12 01/19/2016    Physical Exam  Constitutional: He is oriented to person, place, and time. He appears well-developed and well-nourished.  HENT:  Head: Normocephalic and atraumatic.  Eyes: Pupils are equal, round, and reactive to light. Conjunctivae are normal.  Neck: Normal range of motion. Neck supple. No JVD present.  Cardiovascular: Normal rate. An irregular rhythm present.  Pulmonary/Chest: Effort normal. He has no wheezes. He has no rales.  Abdominal: Soft. He exhibits no distension. There is no tenderness.  Musculoskeletal: He exhibits no edema or tenderness.  Neurological: He is alert and oriented to person, place, and time.  Skin: Skin is warm and  dry.  Psychiatric: He has a normal mood and affect. His behavior is normal. Thought content normal.  Nursing note and vitals reviewed.  Assessment & Plan:  1: Chronic heart failure with reduced ejection fraction- - NYHA class II - euvolemic today - weighing daily at home. Reminded to call for an overnight weight gain of >2 pounds or a weekly weight gain of >5 pounds - weight down 5 pounds since he was last here 6 months ago - not adding salt and trying to follow a 2000mg  sodium diet - now on entresto 24/26mg  BID; BP will limit titration of entresto - saw his cardiologist Nehemiah Massed) 01/30/18 - BNP from 01/16/16 was 700.0 - saw pulmonologist Alva Garnet) 02/02/18  2: Hypotension- - BP looks good today although on the low side - saw PCP Derrel Nip) 04/19/18 - may be difficult to titrate up toprol due to hypotension - BMP from 06/20/17 reviewed and shows sodium 138, potassium 4.8 and GFR 58.64  3: Atrial fibrillation- - currently rate controlled  - on eliquis and toprol XL  Reviewed medication list that patient had brought.  Return in 6 months or sooner for any  questions/problems before then.

## 2018-07-20 ENCOUNTER — Ambulatory Visit: Payer: Medicare Other | Attending: Family | Admitting: Family

## 2018-07-20 ENCOUNTER — Encounter: Payer: Self-pay | Admitting: Family

## 2018-07-20 VITALS — BP 106/53 | HR 90 | Resp 18 | Ht 75.0 in | Wt 265.5 lb

## 2018-07-20 DIAGNOSIS — I2510 Atherosclerotic heart disease of native coronary artery without angina pectoris: Secondary | ICD-10-CM | POA: Diagnosis not present

## 2018-07-20 DIAGNOSIS — I4891 Unspecified atrial fibrillation: Secondary | ICD-10-CM | POA: Diagnosis not present

## 2018-07-20 DIAGNOSIS — Z7901 Long term (current) use of anticoagulants: Secondary | ICD-10-CM | POA: Insufficient documentation

## 2018-07-20 DIAGNOSIS — I950 Idiopathic hypotension: Secondary | ICD-10-CM

## 2018-07-20 DIAGNOSIS — I255 Ischemic cardiomyopathy: Secondary | ICD-10-CM | POA: Diagnosis not present

## 2018-07-20 DIAGNOSIS — I5022 Chronic systolic (congestive) heart failure: Secondary | ICD-10-CM | POA: Diagnosis not present

## 2018-07-20 DIAGNOSIS — D649 Anemia, unspecified: Secondary | ICD-10-CM | POA: Insufficient documentation

## 2018-07-20 DIAGNOSIS — K219 Gastro-esophageal reflux disease without esophagitis: Secondary | ICD-10-CM | POA: Insufficient documentation

## 2018-07-20 DIAGNOSIS — Z8546 Personal history of malignant neoplasm of prostate: Secondary | ICD-10-CM | POA: Insufficient documentation

## 2018-07-20 DIAGNOSIS — G4733 Obstructive sleep apnea (adult) (pediatric): Secondary | ICD-10-CM | POA: Diagnosis not present

## 2018-07-20 DIAGNOSIS — Z79899 Other long term (current) drug therapy: Secondary | ICD-10-CM | POA: Diagnosis not present

## 2018-07-20 DIAGNOSIS — J449 Chronic obstructive pulmonary disease, unspecified: Secondary | ICD-10-CM | POA: Diagnosis not present

## 2018-07-20 DIAGNOSIS — E785 Hyperlipidemia, unspecified: Secondary | ICD-10-CM | POA: Insufficient documentation

## 2018-07-20 DIAGNOSIS — I959 Hypotension, unspecified: Secondary | ICD-10-CM | POA: Diagnosis not present

## 2018-07-20 DIAGNOSIS — D696 Thrombocytopenia, unspecified: Secondary | ICD-10-CM | POA: Diagnosis not present

## 2018-07-20 DIAGNOSIS — I252 Old myocardial infarction: Secondary | ICD-10-CM | POA: Diagnosis not present

## 2018-07-20 DIAGNOSIS — Z95810 Presence of automatic (implantable) cardiac defibrillator: Secondary | ICD-10-CM | POA: Diagnosis not present

## 2018-07-20 DIAGNOSIS — I110 Hypertensive heart disease with heart failure: Secondary | ICD-10-CM | POA: Diagnosis not present

## 2018-07-20 DIAGNOSIS — I482 Chronic atrial fibrillation, unspecified: Secondary | ICD-10-CM

## 2018-07-20 MED ORDER — MONTELUKAST SODIUM 10 MG PO TABS: 10.0000 mg | ORAL_TABLET | Freq: Every day | ORAL | 3 refills | Status: DC

## 2018-07-20 NOTE — Patient Instructions (Signed)
Continue weighing daily and call for an overnight weight gain of > 2 pounds or a weekly weight gain of >5 pounds. 

## 2018-07-25 DIAGNOSIS — I482 Chronic atrial fibrillation: Secondary | ICD-10-CM | POA: Diagnosis not present

## 2018-08-03 DIAGNOSIS — I482 Chronic atrial fibrillation: Secondary | ICD-10-CM | POA: Diagnosis not present

## 2018-08-03 DIAGNOSIS — I10 Essential (primary) hypertension: Secondary | ICD-10-CM | POA: Diagnosis not present

## 2018-08-03 DIAGNOSIS — I5022 Chronic systolic (congestive) heart failure: Secondary | ICD-10-CM | POA: Diagnosis not present

## 2018-08-03 DIAGNOSIS — Z9861 Coronary angioplasty status: Secondary | ICD-10-CM | POA: Diagnosis not present

## 2018-08-03 DIAGNOSIS — R600 Localized edema: Secondary | ICD-10-CM | POA: Diagnosis not present

## 2018-08-03 DIAGNOSIS — I255 Ischemic cardiomyopathy: Secondary | ICD-10-CM | POA: Diagnosis not present

## 2018-08-03 DIAGNOSIS — I4430 Unspecified atrioventricular block: Secondary | ICD-10-CM | POA: Diagnosis not present

## 2018-08-03 DIAGNOSIS — E782 Mixed hyperlipidemia: Secondary | ICD-10-CM | POA: Diagnosis not present

## 2018-08-03 DIAGNOSIS — I25810 Atherosclerosis of coronary artery bypass graft(s) without angina pectoris: Secondary | ICD-10-CM | POA: Diagnosis not present

## 2018-08-03 DIAGNOSIS — G4733 Obstructive sleep apnea (adult) (pediatric): Secondary | ICD-10-CM | POA: Diagnosis not present

## 2018-08-11 MED ORDER — POTASSIUM CHLORIDE CRYS ER 20 MEQ PO TBCR: 20.0000 meq | EXTENDED_RELEASE_TABLET | Freq: Every day | ORAL | 1 refills | Status: DC

## 2018-08-11 NOTE — Telephone Encounter (Signed)
Refilled: 10/03/2017 Last OV: 04/19/2018 Next OV: not scheduled Last labs: 06/10/2017

## 2018-08-17 ENCOUNTER — Encounter: Payer: Self-pay | Admitting: Podiatry

## 2018-08-17 ENCOUNTER — Ambulatory Visit (INDEPENDENT_AMBULATORY_CARE_PROVIDER_SITE_OTHER): Payer: Medicare Other | Admitting: Podiatry

## 2018-08-17 DIAGNOSIS — M79676 Pain in unspecified toe(s): Secondary | ICD-10-CM

## 2018-08-17 DIAGNOSIS — D689 Coagulation defect, unspecified: Secondary | ICD-10-CM | POA: Diagnosis not present

## 2018-08-17 DIAGNOSIS — L989 Disorder of the skin and subcutaneous tissue, unspecified: Secondary | ICD-10-CM

## 2018-08-17 DIAGNOSIS — Z961 Presence of intraocular lens: Secondary | ICD-10-CM | POA: Diagnosis not present

## 2018-08-17 DIAGNOSIS — B351 Tinea unguium: Secondary | ICD-10-CM | POA: Diagnosis not present

## 2018-08-17 NOTE — Progress Notes (Signed)
This patient presents the office with chief complaint of long thick nails  He states the nails are painful walking and wearing his shoes. He is unable to self treat. He  presents the office today for an evaluation and treatment of his nails and  He has a history of gout.  No pain from callus right forefoot today.  Patient is taking eliquiss.  GENERAL APPEARANCE: Alert, conversant. Appropriately groomed. No acute distress.  VASCULAR: Pedal pulses are  palpable at  [facility] and PT bilateral.  Capillary refill time is immediate to all digits,  Normal temperature gradient.  Digital hair growth is present bilateral  NEUROLOGIC: sensation is normal to 5.07 monofilament at 5/5 sites bilateral.  Light touch is intact bilateral, Muscle strength normal.  MUSCULOSKELETAL: acceptable muscle strength, tone and stability bilateral.  Intrinsic muscluature intact bilateral.  Rectus appearance of foot .  Severe hammer toes 1-5  B/L   DERMATOLOGIC: skin color, texture, and turgor are within normal limits.  No preulcerative lesions or ulcers  are seen, no interdigital maceration noted.  No open lesions present. Skin lesion with thrombi noted right forefoot.  NAILS  thick disfigured discolored hallux toenails, both feet   Onychomycosis hallux nails  Skin lesion right.  Debridement of nails. RTC  Debridement of skin lesion.  Lesion bled upon debridement.  DSD applied to wart.   3 months for preventive foot care services.  ABN signed for 2019.     Gardiner Barefoot DPM

## 2018-08-21 ENCOUNTER — Other Ambulatory Visit: Payer: Self-pay | Admitting: Internal Medicine

## 2018-09-07 DIAGNOSIS — H903 Sensorineural hearing loss, bilateral: Secondary | ICD-10-CM | POA: Diagnosis not present

## 2018-09-07 DIAGNOSIS — T162XXA Foreign body in left ear, initial encounter: Secondary | ICD-10-CM | POA: Diagnosis not present

## 2018-09-26 ENCOUNTER — Other Ambulatory Visit: Payer: Self-pay | Admitting: Pulmonary Disease

## 2018-10-23 ENCOUNTER — Ambulatory Visit (INDEPENDENT_AMBULATORY_CARE_PROVIDER_SITE_OTHER): Payer: Medicare Other | Admitting: Podiatry

## 2018-10-23 ENCOUNTER — Encounter: Payer: Self-pay | Admitting: Podiatry

## 2018-10-23 DIAGNOSIS — D689 Coagulation defect, unspecified: Secondary | ICD-10-CM | POA: Diagnosis not present

## 2018-10-23 DIAGNOSIS — M79676 Pain in unspecified toe(s): Secondary | ICD-10-CM

## 2018-10-23 DIAGNOSIS — B351 Tinea unguium: Secondary | ICD-10-CM | POA: Diagnosis not present

## 2018-10-23 DIAGNOSIS — L989 Disorder of the skin and subcutaneous tissue, unspecified: Secondary | ICD-10-CM

## 2018-10-23 DIAGNOSIS — B070 Plantar wart: Secondary | ICD-10-CM

## 2018-10-23 NOTE — Progress Notes (Signed)
This patient presents the office with chief complaint of long thick nails  He states the nails are painful walking and wearing his shoes. He is unable to self treat. He  presents the office today for an evaluation and treatment of his nails and  He has a history of gout.  No pain from callus right forefoot today.  Patient is taking eliquiss.  GENERAL APPEARANCE: Alert, conversant. Appropriately groomed. No acute distress.  VASCULAR: Pedal pulses are  palpable at  [facility] and PT bilateral.  Capillary refill time is immediate to all digits,  Normal temperature gradient.  Digital hair growth is present bilateral  NEUROLOGIC: sensation is normal to 5.07 monofilament at 5/5 sites bilateral.  Light touch is intact bilateral, Muscle strength normal.  MUSCULOSKELETAL: acceptable muscle strength, tone and stability bilateral.  Intrinsic muscluature intact bilateral.  Rectus appearance of foot .  Severe hammer toes 1-5  B/L   DERMATOLOGIC: skin color, texture, and turgor are within normal limits.  No preulcerative lesions or ulcers  are seen, no interdigital maceration noted.  No open lesions present. Skin lesion with thrombi noted right forefoot.  NAILS  thick disfigured discolored hallux toenails, both feet   Onychomycosis hallux nails  Skin lesion right.  Debridement of nails. RTC  Debridement of skin lesion.  Cantacur applied to two verrucae right forefoot.  RTC 2 weeks for wart treatment.  3 months for preventive foot care services.  ABN signed for 2019.     Gardiner Barefoot DPM

## 2018-10-25 ENCOUNTER — Other Ambulatory Visit: Payer: Self-pay

## 2018-10-25 ENCOUNTER — Ambulatory Visit: Payer: Medicare Other

## 2018-10-25 ENCOUNTER — Ambulatory Visit (INDEPENDENT_AMBULATORY_CARE_PROVIDER_SITE_OTHER): Payer: Medicare Other

## 2018-10-25 VITALS — BP 112/68 | HR 66 | Temp 98.2°F | Resp 16 | Ht 72.5 in | Wt 272.1 lb

## 2018-10-25 DIAGNOSIS — Z0000 Encounter for general adult medical examination without abnormal findings: Secondary | ICD-10-CM

## 2018-10-25 DIAGNOSIS — Z23 Encounter for immunization: Secondary | ICD-10-CM

## 2018-10-25 NOTE — Progress Notes (Addendum)
Subjective:   Rick Gilmore is a 77 y.o. male who presents for Medicare Annual/Subsequent preventive examination.  Review of Systems:  No ROS.  Medicare Wellness Visit. Additional risk factors are reflected in the social history. Cardiac Risk Factors include: advanced age (>54mn, >>54women);male gender;obesity (BMI >30kg/m2)     Objective:    Vitals: BP 112/68   Pulse 66   Temp 98.2 F (36.8 C) (Oral)   Resp 16   Ht 6' 0.5" (1.842 m)   Wt 272 lb 1.9 oz (123.4 kg)   SpO2 95%   BMI 36.40 kg/m   Body mass index is 36.4 kg/m.  Advanced Directives 10/25/2018 10/24/2017 07/12/2017 01/11/2017 10/22/2016 09/19/2016 07/09/2016  Does Patient Have a Medical Advance Directive? Yes Yes Yes Yes Yes No Yes  Type of AParamedicof A[place]Living will Living will;Healthcare Power of [place] will Healthcare Power of A[place]netta NorthLiving will - Living will;Healthcare Power of Attorney  Does patient want to make changes to medical advance directive? No - Patient declined No - Patient declined - - - - No - Patient declined  Copy of HGiddingsin Chart? No - copy requested No - copy requested - - No - copy requested - No - copy requested    Tobacco Social History   Tobacco Use  Smoking Status Never Smoker  Smokeless Tobacco Never Used     Counseling given: Not Answered   Clinical Intake:  Pre-visit preparation completed: Yes  Pain : No/denies pain     Nutritional Status: BMI > 30  Obese Diabetes: Yes  How often do you need to have someone help you when you read instructions, pamphlets, or other written materials from your doctor or pharmacy?: 1 - Never  Interpreter Needed?: No     Past Medical History:  Diagnosis Date  . AICD (automatic cardioverter/defibrillator) present   . Anemia   . Arrhythmia    a fib  . Atrial fibrillation ([place]    on Coumadin  . CAD (coronary artery  disease)   . Cancer ([facility] 2001   Prostate, XRT and implant  . CHF (congestive heart failure) ([place]   . COPD (chronic obstructive pulmonary disease) ([place]   . Erectile dysfunction   . GERD (gastroesophageal reflux disease)   . Hyperlipidemia   . Hypertension   . Ischemic cardiomyopathy    s/p AICD/pacer 2009, replaced 2010 [facility]  . MI ([place] infarction) [place]  . Personal history of prostate cancer 2001   s/p XRT and implant ([place])  . Prostate cancer ([place]   . Sleep apnea, obstructive    uses CPAP nightly  . Thrombocytopenia ([place]   . Tubular adenoma    colon polyp  . Vitamin B 12 deficiency    Past Surgical History:  Procedure Laterality Date  . CARDIAC CATHETERIZATION    . COLONOSCOPY WITH PROPOFOL [name] 05/30/2015   Procedure: COLONOSCOPY WITH PROPOFOL;  Surgeon: RManya Silvas MD;  [facility]  Service: End[facility];  Laterality: [name];  . CORONARY ARTERY BYPASS GRAFT  1988  . ESOPHAGOGASTRODUODENOSCOPY [name] 05/30/2015   Procedure: ESOPHAGOGASTRODUODENOSCOPY (EGD);  Surgeon: RManya Silvas MD;  [facility];  Service: Endoscopy;  Laterality: [name];  . IMPLANTABLE CARDIOVERTER DEFIBRILLATOR GENERATOR CHANGE  April 2014   Bi V RRR  . INSERT / REPLACE / REMOVE PACEMAKER  2014  . VASECTOMY     Family History  Problem Relation Age of Onset  .  Hypertension Mother   . Hypertension Father   . Cancer Father        prostate, throat  . Emphysema Father   . Heart disease Maternal Grandmother   . Heart disease Paternal Grandmother    Social History   Socioeconomic History  . Marital status: Divorced    Spouse name: Not on file  . Number of children: Not on file  . Years of education: Not on file  . Highest education level: Not on file  Occupational History  . Occupation: Retired  Scientific laboratory technician  . Financial resource strain: Not hard at all  . Food insecurity:    Worry: Never true    Inability: Never true  . Transportation needs:    Medical:  No    Non-medical: No  Tobacco Use  . Smoking status: Never Smoker  . Smokeless tobacco: Never Used  Substance and Sexual Activity  . Alcohol use: Yes    Alcohol/week: 4.0 standard drinks    Types: 2 Standard drinks or equivalent, 2 Cans of beer per week    Comment: Mixed drinks  . Drug use: No  . Sexual activity: Never  Lifestyle  . Physical activity:    Days per week: 0 days    Minutes per session: 0 min  . Stress: Not at all  Relationships  . Social connections:    Talks on phone: More than three times a week    Gets together: Three times a week    Attends religious service: More than 4 times per year    Active member of club or organization: Yes    Attends meetings of clubs or organizations: More than 4 times per year    Relationship status: Divorced  Other Topics Concern  . Not on file  Social History Narrative  . Not on file    Outpatient Encounter Medications as of 10/25/2018  Medication Sig  . allopurinol (ZYLOPRIM) 100 MG tablet TAKE 1 TABLET DAILY  . ANORO ELLIPTA 62.5-25 MCG/INH AEPB USE 1 INHALATION DAILY  . apixaban (ELIQUIS) 5 MG TABS tablet Take by mouth.  . colchicine 0.6 MG tablet Take 0.6 mg by mouth daily as needed (for gout flares).   . Cyanocobalamin 1000 MCG SUBL Place 1 tablet (1,000 mcg total) under the tongue every morning.  Marland Kitchen ENTRESTO 24-26 MG   . escitalopram (LEXAPRO) 10 MG tablet TAKE 1 TABLET DAILY  . fluticasone (FLONASE) 50 MCG/ACT nasal spray USE 1 SPRAY IN EACH NOSTRIL DAILY (Patient taking differently: 2 sprays. )  . isosorbide mononitrate (IMDUR) 30 MG 24 hr tablet Take 30 mg by mouth daily.  . montelukast (SINGULAIR) 10 MG tablet Take 1 tablet (10 mg total) by mouth at bedtime.  . pantoprazole (PROTONIX) 40 MG tablet TAKE 1 TABLET DAILY  . potassium chloride SA (KLOR-CON M20) 20 MEQ tablet Take 1 tablet (20 mEq total) by mouth daily.  . TOPROL XL 100 MG 24 hr tablet TAKE 1 TABLET DAILY WITH OR IMMEDIATELY FOLLOWING A MEAL  . torsemide  (DEMADEX) 20 MG tablet TAKE 3 TABLETS IN THE MORNING AND 2 TABLETS IN THE EVENING   No facility-administered encounter medications on file as of 10/25/2018.     Activities of Daily Living In your present state of health, do you have any difficulty performing the following activities: 10/25/2018 07/20/2018  Hearing? Tempie Donning  Vision? N N  Difficulty concentrating or making decisions? N N  Walking or climbing stairs? N N  Dressing or bathing? N N  Doing errands, shopping? N N  Preparing Food and eating ? N -  Using the Toilet? N -  In the past six months, have you accidently leaked urine? N -  Do you have problems with loss of bowel control? N -  Managing your Medications? N -  Managing your Finances? N -  Housekeeping or managing your Housekeeping? N -  Some recent data might be hidden    Patient Care Team: Crecencio Mc, MD as PCP - General (Internal Medicine) Manya Silvas, MD (Gastroenterology) Corey Skains, MD as Consulting Physician (Cardiology) Alisa Graff, FNP as Nurse Practitioner (Family Medicine) Vilinda Boehringer, MD (Inactive) as Consulting Physician (Pulmonary Disease)   Assessment:   This is a routine wellness examination for [place].  The goal of the wellness visit is to assist the patient how to close the gaps in care and create a preventative care plan for the patient.   The roster of all physicians providing medical care to patient is listed in the Snapshot section of the chart.  Osteoporosis risk reviewed.    Safety issues reviewed; Smoke and carbon monoxide detectors in the home. No firearms in the home. Wears seatbelts when driving or riding with others. No violence in the home.  They do not have excessive sun exposure.  Discussed the need for sun protection: hats, long sleeves and the use of sunscreen if there is significant sun exposure.  Patient is alert, normal appearance, oriented to person/place/and time.  Correctly identified the president  of the [place] and recalls of 3/3 words. Performs simple calculations and can read correct time from watch face.  Displays appropriate judgement.  No new identified risk were noted.  No failures at ADL's or IADL's.    BMI- discussed the importance of a healthy diet, water intake and the benefits of aerobic exercise. He plans to start [place] exercises and make better choices for diet while increasing his water intake. Educational material provided.   24 hour diet recall: Regular diet  Dental- every 6 months.  Eye- Visual acuity not assessed per patient preference since they have regular follow up with the ophthalmologist.  Wears corrective lenses.  Sleep patterns- Sleeps 10-11 hours at night.  Wakes feeling rested. CPAP in use.  High dose influenza vaccine administered L deltoid, tolerated well. Educational material provided.  Health maintenance gaps- closed.  Patient Concerns: None at this time. Follow up with PCP as needed.  Exercise Activities and Dietary recommendations Current Exercise Habits: The patient does not participate in regular exercise at present  Goals      Patient Stated   . Increase physical activity (pt-stated)     [employer]       Other   . DIET - INCREASE WATER INTAKE       Fall Risk Fall Risk  10/25/2018 07/20/2018 01/12/2018 10/24/2017 07/12/2017  Falls in the past year? 0 No No No No  Number falls in past yr: - - - - -  Injury with Fall? - - - - -  Risk Factor Category  - - - - -  Risk for fall due to : - - - - -  Follow up - - - - -   Depression Screen PHQ 2/9 Scores 10/25/2018 07/20/2018 01/12/2018 10/24/2017  PHQ - 2 Score 0 0 0 0  PHQ- 9 Score - - - 0    Cognitive Function MMSE - Mini Mental State Exam 10/24/2017 10/22/2016  Orientation to time 5 5  Orientation  to Place 5 5  Registration 3 3  Attention/ Calculation 5 5  Recall 3 3  Language- name 2 objects 2 2  Language- repeat 1 1  Language- follow 3 step command 3 3  Language- read &  follow direction 1 1  Write a sentence 1 1  Copy design 1 1  Total score 30 30     6CIT Screen 10/25/2018  What Year? 0 points  What month? 0 points  What time? 0 points  Count back from 20 0 points  Months in reverse 0 points  Repeat phrase 0 points  Total Score 0    Immunization History  Administered Date(s) Administered  . Influenza Split 09/14/2011, 08/31/2012  . Influenza, High Dose Seasonal PF 09/27/2016, 10/24/2017, 10/25/2018  . Influenza,inj,Quad PF,6+ Mos 09/05/2013, 08/08/2014, 09/02/2015  . Pneumococcal Conjugate-13 06/01/2011  . Pneumococcal Polysaccharide-23 07/13/2013  . Tdap 11/03/2011, 09/19/2016  . Zoster 10/06/2011   Screening Tests Health Maintenance  Topic Date Due  . TETANUS/TDAP  09/19/2026  . INFLUENZA VACCINE  Completed  . PNA vac Low Risk Adult  Completed      Plan:    End of life planning; Advance aging; Advanced directives discussed. Copy of current HCPOA/Living Will requested.    I have personally reviewed and noted the following in the patient's chart:   . Medical and social history . Use of alcohol, tobacco or illicit drugs  . Current medications and supplements . Functional ability and status . Nutritional status . Physical activity . Advanced directives . List of other physicians . Hospitalizations, surgeries, and ER visits in previous 12 months . Vitals . Screenings to include cognitive, depression, and falls . Referrals and appointments  In addition, I have reviewed and discussed with patient certain preventive protocols, quality metrics, and best practice recommendations. A written personalized care plan for preventive services as well as general preventive health recommendations were provided to patient.     OBrien-Blaney, Lary Eckardt L, LPN  [id number]   I have reviewed the above information and agree with above.   Deborra Medina, MD

## 2018-10-25 NOTE — Patient Instructions (Addendum)
  [name] , Thank you for taking time to come for your Medicare Wellness Visit. I appreciate your ongoing commitment to your health goals. Please review the following plan we discussed and let me know if I can assist you in the future.   Follow up as needed.    Bring a copy of your [place] and/or Living Will to be scanned into chart.  150 minutes exercise per week recommended.   Happy Holidays!  These are the goals we discussed: Goals      Patient Stated   . Increase physical activity (pt-stated)     [employer]       Other   . DIET - INCREASE WATER INTAKE       This is a list of the screening recommended for you and due dates:  Health Maintenance  Topic Date Due  . Tetanus Vaccine  09/19/2026  . Flu Shot  Completed  . Pneumonia vaccines  Completed

## 2018-10-26 DIAGNOSIS — Z95810 Presence of automatic (implantable) cardiac defibrillator: Secondary | ICD-10-CM | POA: Diagnosis not present

## 2018-10-27 NOTE — Telephone Encounter (Signed)
protonix 40mg  [facility], R1

## 2018-11-06 ENCOUNTER — Ambulatory Visit (INDEPENDENT_AMBULATORY_CARE_PROVIDER_SITE_OTHER): Payer: Medicare Other | Admitting: Podiatry

## 2018-11-06 ENCOUNTER — Encounter: Payer: Self-pay | Admitting: Podiatry

## 2018-11-06 DIAGNOSIS — L989 Disorder of the skin and subcutaneous tissue, unspecified: Secondary | ICD-10-CM

## 2018-11-06 DIAGNOSIS — B070 Plantar wart: Secondary | ICD-10-CM

## 2018-11-06 NOTE — Progress Notes (Signed)
This patient presents the office for continued evaluation and treatment of warts on his right forefoot.  He was treated with acid 10 days ago and told to return.  He presents the office today stating that there minimally sore when he walks.  He presents the office today for continued evaluation and treatment.   Patient is taking eliquiss.  GENERAL APPEARANCE: Alert, conversant. Appropriately groomed. No acute distress.  VASCULAR: Pedal pulses are  palpable at  [facility] and PT bilateral.  Capillary refill time is immediate to all digits,  Normal temperature gradient.  Digital hair growth is present bilateral  NEUROLOGIC: sensation is normal to 5.07 monofilament at 5/5 sites bilateral.  Light touch is intact bilateral, Muscle strength normal.  MUSCULOSKELETAL: acceptable muscle strength, tone and stability bilateral.  Intrinsic muscluature intact bilateral.  Rectus appearance of foot .  Severe hammer toes 1-5  B/L   DERMATOLOGIC: skin color, texture, and turgor are within normal limits.  No preulcerative lesions or ulcers  are seen, no interdigital maceration noted.  No open lesions present.  Skin lesion on his right forefoot reveal necrotic tissue covering previously diagnosed wart tissue.  NAILS  thick disfigured discolored hallux toenails, both feet  Benign skin tissue.  Plantar wart right foot.  Debridement necrotic tissue. Reapplication of  Cantacur  to two verrucae right forefoot.  RTC 1 weeks for wart treatment.  3 months for preventive foot care services.  ABN signed for 2019.     Gardiner Barefoot DPM

## 2018-11-13 ENCOUNTER — Ambulatory Visit: Payer: TRICARE For Life (TFL) | Admitting: Podiatry

## 2018-11-21 MED ORDER — ENTRESTO 24-26 MG PO TABS: 1.0000 | ORAL_TABLET | Freq: Two times a day (BID) | ORAL | 1 refills | Status: DC

## 2018-12-23 ENCOUNTER — Encounter: Payer: Self-pay | Admitting: Podiatry

## 2018-12-25 ENCOUNTER — Ambulatory Visit: Payer: TRICARE For Life (TFL) | Admitting: Podiatry

## 2018-12-26 ENCOUNTER — Ambulatory Visit (INDEPENDENT_AMBULATORY_CARE_PROVIDER_SITE_OTHER): Payer: Medicare Other | Admitting: Family Medicine

## 2018-12-26 ENCOUNTER — Encounter: Payer: Self-pay | Admitting: Family Medicine

## 2018-12-26 ENCOUNTER — Ambulatory Visit (INDEPENDENT_AMBULATORY_CARE_PROVIDER_SITE_OTHER): Payer: Medicare Other

## 2018-12-26 VITALS — BP 118/60 | HR 88 | Temp 98.7°F | Resp 20 | Ht 75.0 in | Wt 272.8 lb

## 2018-12-26 DIAGNOSIS — R05 Cough: Secondary | ICD-10-CM

## 2018-12-26 DIAGNOSIS — J029 Acute pharyngitis, unspecified: Secondary | ICD-10-CM

## 2018-12-26 DIAGNOSIS — R058 Other specified cough: Secondary | ICD-10-CM

## 2018-12-26 DIAGNOSIS — J441 Chronic obstructive pulmonary disease with (acute) exacerbation: Secondary | ICD-10-CM

## 2018-12-26 DIAGNOSIS — J90 Pleural effusion, not elsewhere classified: Secondary | ICD-10-CM | POA: Diagnosis not present

## 2018-12-26 LAB — POCT RAPID STREP A (OFFICE): Rapid Strep A Screen: NEGATIVE

## 2018-12-26 MED ORDER — HYDROCOD POLST-CPM POLST ER 10-8 MG/5ML PO SUER: 5.0000 mL | Freq: Two times a day (BID) | ORAL | 0 refills | Status: DC | PRN

## 2018-12-26 MED ORDER — ALBUTEROL SULFATE HFA 108 (90 BASE) MCG/ACT IN AERS: 2.0000 | INHALATION_SPRAY | Freq: Four times a day (QID) | RESPIRATORY_TRACT | 1 refills | Status: AC | PRN

## 2018-12-26 MED ORDER — PREDNISONE 10 MG (21) PO TBPK: ORAL_TABLET | ORAL | 0 refills | Status: DC

## 2018-12-26 MED ORDER — DOXYCYCLINE HYCLATE 100 MG PO TABS: 100.0000 mg | ORAL_TABLET | Freq: Two times a day (BID) | ORAL | 0 refills | Status: AC

## 2018-12-26 NOTE — Progress Notes (Signed)
Subjective:    Patient ID: Rick Gilmore, male    DOB: 1941-02-23, 78 y.o.   MRN: [id number]  HPI   Patient presents to clinic complaining of cough, chest congestion, green phlegm for the past 3 to 4 days.  Patient states sometimes he will cough so hard that he loses his breath.  Patient has a long history of COPD as well as multiple heart conditions.  He does take Anoro every day for COPD control.  He has been using over-the-counter Mucinex with some effect in helping cough symptoms improve, but cough mainly is worse at night and keeps him awake.  Denies fever or chills.  Denies nausea/vomiting or diarrhea.  Denies chest pain.  Patient Active Problem List   Diagnosis Date Noted  . 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 Right calf pain 11/18/2017  . Calf swelling 11/18/2017  . Foreign body in ear, left, initial encounter 09/28/2016  . Obesity 04/20/2016  . Hearing loss of left ear due to cerumen impaction 04/19/2016  . Hypotension 03/10/2016  . Allergic rhinitis 02/02/2016  . Chronic renal insufficiency 01/16/2016  . Hyperglycemia 01/16/2016  . 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 S/P implantation of automatic cardioverter/defibrillator (AICD) 10/21/2015  . Diverticulosis of colon without hemorrhage 10/21/2015  . Internal hemorrhoids 10/21/2015  . Atrial fibrillation ([place]) 10/15/2015  . Tubular adenoma of colon 06/04/2015  . COPD (chronic obstructive pulmonary disease) ([place]) 04/20/2015  . Iron deficiency anemia 04/13/2015  . Thrombocytopenia ([place]) 12/01/2014  . Hyperlipidemia LDL goal <70 12/01/2014  . Pleural effusion, bilateral 10/17/2014  . Chronic systolic heart failure ([place]) 10/08/2014  . Long term current use of anticoagulant therapy 11/02/2011  . Pernicious anemia 11/02/2011  . Ischemic cardiomyopathy   . Sleep apnea, obstructive    Social History   Tobacco Use  . Smoking status: Never Smoker  . Smokeless tobacco: Never Used  Substance Use Topics    . Alcohol use: Yes    Alcohol/week: 4.0 standard drinks    Types: 2 Standard drinks or equivalent, 2 Cans of beer per week    Comment: Mixed drinks   Review of Systems  Constitutional: Negative for chills, fatigue and fever.  HENT: Negative for congestion, ear pain, sinus pain and sore throat.   Eyes: Negative.   Respiratory: +cough with thick phlegm, chest congestion, shortness of breath and wheezing.   Cardiovascular: Negative for chest pain, palpitations and leg swelling.  Gastrointestinal: Negative for abdominal pain, diarrhea, nausea and vomiting.  Genitourinary: Negative for dysuria, frequency and urgency.  Musculoskeletal: Negative for arthralgias and myalgias.  Skin: Negative for color change, pallor and rash.  Neurological: Negative for syncope, light-headedness and headaches.  Psychiatric/Behavioral: The patient is not nervous/anxious.       Objective:   Physical Exam  Constitutional: He appears well-developed and well-nourished. No distress.  HENT:  Head: Normocephalic and atraumatic.  Eyes: Conjunctivae and EOM are normal. No scleral icterus Ears: Normal Nose/throat: Mild postnasal drip.  Neck: Normal range of motion. Neck supple. No tracheal deviation present.  Cardiovascular: Normal rate, regular rhythm and normal heart sounds.  Pulmonary/Chest: Effort normal.  Scattered expiratory wheezes and rhonchi in upper lobes.  Harsh raspy wet cough. Neurological: He is alert and oriented to person, place, and time.  Gait normal  Skin: Skin is warm and dry. He is not diaphoretic. No pallor.  Psychiatric: He has a normal mood and affect. His behavior is normal.   Nursing note and vitals reviewed.  Vitals:  12/26/18 1119  BP: 118/60  Pulse: 88  Resp: 20  Temp: 98.7 F (37.1 C)  SpO2: 94%      Assessment & Plan:   COPD with exacerbation, cough productive of purulent sputum- due to patient's COPD history and discoloration of the sputum he will get CXR in clinic  today we will treat with doxycycline twice daily for 7 days.  He will also continue using his daily Anoro and I will send in albuterol inhaler to use if needed to treat times of wheezing or shortness of breath.  Patient will also take oral steroid taper to help reduce inflammation and he will use Tussionex cough syrup as needed to help calm cough.  Patient is aware that this medication can cause drowsiness and do not take prior to driving.  Advised patient that he can continue to use Mucinex or Robitussin syrup if needed during the daytime hours as these medication options do not cause drowsiness.  Sore throat -point-of-care rapid strep is negative in clinic.  Suspect sore throat is related to patient's coughing  Patient will keep regularly scheduled follow-up with PCP as planned.  Advised to return to clinic if current symptoms do not improve as expected over the next 1 to 2 weeks.

## 2018-12-29 ENCOUNTER — Telehealth: Payer: Self-pay | Admitting: Internal Medicine

## 2018-12-29 DIAGNOSIS — R05 Cough: Secondary | ICD-10-CM

## 2018-12-29 DIAGNOSIS — R058 Other specified cough: Secondary | ICD-10-CM

## 2018-12-29 MED ORDER — HYDROCODONE-HOMATROPINE 5-1.5 MG/5ML PO SYRP: 5.0000 mL | ORAL_SOLUTION | Freq: Three times a day (TID) | ORAL | 0 refills | Status: DC | PRN

## 2018-12-29 NOTE — Telephone Encounter (Signed)
Copied from [place] [id number]. Topic: Quick Communication - Rx Refill/Question >> Dec 29, 2018  8:48 AM Rayann Heman wrote: Medication: chlorpheniramine-HYDROcodone Amanda Cockayne [facility]) 10-8 MG/5ML Latanya Presser [[id number]] pharmacy called patient and stated that they can not fill this medication the way it was written and would need a call back regarding how to fill.

## 2018-12-29 NOTE — Telephone Encounter (Signed)
Called [facility]. Pharmacist stated that they do not carry Tussionex, they do have hydromet in stock. If you prefer pt have tussionex, it will have to be sent to a different pharmacy. OK to switch to hydromet?

## 2018-12-29 NOTE — Telephone Encounter (Signed)
Hydromet sent instead

## 2018-12-29 NOTE — Telephone Encounter (Signed)
Pt aware.

## 2019-01-01 ENCOUNTER — Encounter: Payer: Self-pay | Admitting: Podiatry

## 2019-01-01 ENCOUNTER — Ambulatory Visit (INDEPENDENT_AMBULATORY_CARE_PROVIDER_SITE_OTHER): Payer: Medicare Other | Admitting: Podiatry

## 2019-01-01 DIAGNOSIS — L989 Disorder of the skin and subcutaneous tissue, unspecified: Secondary | ICD-10-CM

## 2019-01-01 DIAGNOSIS — D689 Coagulation defect, unspecified: Secondary | ICD-10-CM

## 2019-01-01 DIAGNOSIS — M79676 Pain in unspecified toe(s): Secondary | ICD-10-CM

## 2019-01-01 DIAGNOSIS — B351 Tinea unguium: Secondary | ICD-10-CM

## 2019-01-01 NOTE — Progress Notes (Signed)
This patient presents the office with chief complaint of long thick nails  He states the nails are painful walking and wearing his shoes. He is unable to self treat. He  presents the office today for an evaluation and treatment of his nails and    No pain from callus right forefoot today.  Patient is taking eliquiss.  GENERAL APPEARANCE: Alert, conversant. Appropriately groomed. No acute distress.  VASCULAR: Pedal pulses are  palpable at  [facility] and PT bilateral.  Capillary refill time is immediate to all digits,  Normal temperature gradient.  Digital hair growth is present bilateral  NEUROLOGIC: sensation is normal to 5.07 monofilament at 5/5 sites bilateral.  Light touch is intact bilateral, Muscle strength normal.  MUSCULOSKELETAL: acceptable muscle strength, tone and stability bilateral.  Intrinsic muscluature intact bilateral.  Rectus appearance of foot .  Severe hammer toes 1-5  B/L   DERMATOLOGIC: skin color, texture, and turgor are within normal limits.  No preulcerative lesions or ulcers  are seen, no interdigital maceration noted.  No open lesions present.  .  Porokeratosis/Wart, sub-4 of the right foot  NAILS  thick disfigured discolored hallux toenails, both feet   Onychomycosis hallux nails    Debridement of nails. RTC  3 months for preventive foot care services.  ABN signed for 2020.     Gardiner Barefoot DPM

## 2019-01-04 ENCOUNTER — Ambulatory Visit: Payer: Self-pay | Admitting: Internal Medicine

## 2019-01-04 NOTE — Telephone Encounter (Signed)
FYI scheduled with Lauren tomorrow.

## 2019-01-04 NOTE — Telephone Encounter (Signed)
Contacted pt regarding symptoms; he was seen in the office on 12/26/2018; the pt says that he took his antibiotic and prednisone, and he felt better; on 01/02/2019 the pt says that his hoarsness came back, nausea x 1 episode, sneezing, and productive cough with brownish secretions, and he feels tired; he is most concerned about his hoarseness; recommendations made for nurse triage protocol; the pt normally sees Dr Derrel Nip but she has availability; pt offered and accepted appointment with Philis Nettle, 01/05/2019 at 60; he verbalized understanding; will route for notification.      Reason for Disposition . Hoarseness persists > 2 weeks  Answer Assessment - Initial Assessment Questions 1. DESCRIPTION: "Describe your voice."     hoarse 2. ONSET: "When did the hoarseness begin?"     01/03/2019 3. COUGH: "Is there a cough?" If so, ask: "How bad?"     Yes; not that bad 4. FEVER: "Do you have a fever?" If so, ask: "What is your temperature, how was it measured, and when did it start?"     no 5. RESPIRATORY STATUS: "Describe your breathing."      Breathing ok 6. ALLERGIES: "Any allergy symptoms?" If so, ask: "What are they?"     sneezing 7. IRRITANTS: "Do you smoke?" "Have you been exposed to any irritating fumes?"     no 8. CAUSE: "What do you think is causing the hoarseness?"     Just being out in the weather on 01/03/2019; hoareness had reslolved from   9. OTHER SYMPTOMS: "Do you have any other symptoms?" (e.g., sore throat, swelling, foreign body, rash)     Productive cough, nausea x 1 episode 10. PREGNANCY: "Is there any chance you are pregnant?" "When was your last menstrual period?"       n/a  Protocols used: [id number]

## 2019-01-05 ENCOUNTER — Ambulatory Visit (INDEPENDENT_AMBULATORY_CARE_PROVIDER_SITE_OTHER): Payer: Medicare Other | Admitting: Family Medicine

## 2019-01-05 ENCOUNTER — Encounter: Payer: Self-pay | Admitting: Family Medicine

## 2019-01-05 DIAGNOSIS — R058 Other specified cough: Secondary | ICD-10-CM

## 2019-01-05 DIAGNOSIS — R05 Cough: Secondary | ICD-10-CM

## 2019-01-05 DIAGNOSIS — J441 Chronic obstructive pulmonary disease with (acute) exacerbation: Secondary | ICD-10-CM

## 2019-01-05 MED ORDER — LEVOFLOXACIN 500 MG PO TABS: 500.0000 mg | ORAL_TABLET | Freq: Every day | ORAL | 0 refills | Status: DC

## 2019-01-05 MED ORDER — PREDNISONE 10 MG (21) PO TBPK: ORAL_TABLET | ORAL | 0 refills | Status: DC

## 2019-01-05 NOTE — Patient Instructions (Signed)
Have fun at [facility]!

## 2019-01-05 NOTE — Progress Notes (Signed)
Subjective:    Patient ID: Rick Gilmore, male    DOB: [birth year], 78 y.o.   MRN: [id number]  HPI   Patient presents to clinic complaining of productive cough and hoarseness.  Patient states after treatment with doxycycline and steroid taper combination of cough syrup last week, he was feeling much better.  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  Patient states the productive cough and chest congestion began to slowly return to 3 days ago.  Patient is concerned that he would not be well in time for upcoming trip to [place] to celebrate M[place].  Denies fever or chills.  States his albuterol inhaler does help when he is feeling short of breath or wheezy.  Denies chest pain or palpitations.  Denies nausea/vomiting or diarrhea.  CXR done at last visit.   Patient Active Problem List   Diagnosis Date Noted  . 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 Right calf pain 11/18/2017  . Calf swelling 11/18/2017  . Foreign body in ear, left, initial encounter 09/28/2016  . Obesity 04/20/2016  . Hearing loss of left ear due to cerumen impaction 04/19/2016  . Hypotension 03/10/2016  . Allergic rhinitis 02/02/2016  . Chronic renal insufficiency 01/16/2016  . Hyperglycemia 01/16/2016  . 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 S/P implantation of automatic cardioverter/defibrillator (AICD) 10/21/2015  . Diverticulosis of colon without hemorrhage 10/21/2015  . Internal hemorrhoids 10/21/2015  . Atrial fibrillation ([place]) 10/15/2015  . Tubular adenoma of colon 06/04/2015  . COPD (chronic obstructive pulmonary disease) ([place]) 04/20/2015  . Iron deficiency anemia 04/13/2015  . Thrombocytopenia ([place]) 12/01/2014  . Hyperlipidemia LDL goal <70 12/01/2014  . Pleural effusion, bilateral 10/17/2014  . Chronic systolic heart failure ([place]) 10/08/2014  . Long term  current use of anticoagulant therapy 11/02/2011  . Pernicious anemia 11/02/2011  . Ischemic cardiomyopathy   . Sleep apnea, obstructive    Social History   Tobacco Use  . Smoking status: Never Smoker  . Smokeless tobacco: Never Used  Substance Use Topics  . Alcohol use: Yes    Alcohol/week: 4.0 standard drinks    Types: 2 Standard drinks or equivalent, 2 Cans of beer per week    Comment: Mixed drinks   Review of Systems  Constitutional: Negative for chills, fatigue and fever.  HENT: Negative for congestion, ear pain, sinus pain and sore throat.   Eyes: Negative.   Respiratory: cough (productive), chest congestion, shortness of breath and wheezing.   Cardiovascular: Negative for chest pain, palpitations and leg swelling.  Gastrointestinal: Negative for abdominal pain, diarrhea, nausea and vomiting.  Genitourinary: Negative for dysuria, frequency and urgency.  Musculoskeletal: Negative for arthralgias and myalgias.  Skin: Negative for color change, pallor and rash.  Neurological: Negative for syncope, light-headedness and headaches.  Psychiatric/Behavioral: The patient is not nervous/anxious.       Objective:   Physical Exam Vitals signs and nursing note reviewed.  Constitutional:      General: He is not in acute distress.    Appearance: He is not toxic-appearing or diaphoretic.  HENT:     Head: Normocephalic and atraumatic.     Nose: Congestion and rhinorrhea present.     Mouth/Throat:     Mouth: Mucous membranes are moist.     Pharynx: No oropharyngeal exudate or posterior oropharyngeal erythema.  Eyes:     General: No  scleral icterus.    Extraocular Movements: Extraocular movements intact.     Conjunctiva/sclera: Conjunctivae normal.  Neck:     Musculoskeletal: Normal range of motion and neck supple. No neck rigidity.  Cardiovascular:     Rate and Rhythm: Normal rate and regular rhythm.     Heart sounds: Normal heart sounds.  Pulmonary:     Effort: Pulmonary  effort is normal. No respiratory distress.     Breath sounds: No stridor. No rales.     Comments: Scattered expiratory wheezes and rhonchi in upper lobes.  Harsh raspy wet cough. Musculoskeletal:     Right lower leg: No edema.     Left lower leg: No edema.  Lymphadenopathy:     Cervical: No cervical adenopathy.  Skin:    General: Skin is warm and dry.     Coloration: Skin is not jaundiced or pale.  Neurological:     Mental Status: He is alert and oriented to person, place, and time.  Psychiatric:        Mood and Affect: Mood normal.        Behavior: Behavior normal.    Vitals:   01/05/19 1114  BP: 124/82  Pulse: 93  Temp: 98.2 F (36.8 C)  SpO2: 95%      Assessment & Plan:   COPD with exacerbation, cough productive purulent sputum - chest x-ray done at last visit did not reveal any pneumonia.  However he was treated with doxycycline course at that time.  Patient symptoms slowly resolved and he was feeling better.  However now they have returned we will treat with course of Levaquin and another steroid taper.  Patient still has some cough syrup left over from last week and will use this as needed.  He also will use albuterol inhaler as needed for wheezing or shortness of breath.  Advised that if shortness of breath or wheezing is not improved with use of albuterol inhaler, he is to call office and or go to emergency room right away for evaluation.  Patient will keep regular follow-up with PCP as planned.  Offered 1 to 2-week follow-up to be sure he is improving, but he declines and states he will call and let us know if he is not getting better.

## 2019-01-15 ENCOUNTER — Other Ambulatory Visit: Payer: Self-pay | Admitting: Internal Medicine

## 2019-01-18 ENCOUNTER — Ambulatory Visit: Payer: Medicare Other | Attending: Family | Admitting: Family

## 2019-01-18 ENCOUNTER — Encounter: Payer: Self-pay | Admitting: Family

## 2019-01-18 VITALS — BP 123/86 | HR 66 | Resp 18 | Ht 74.0 in | Wt 268.4 lb

## 2019-01-18 DIAGNOSIS — E785 Hyperlipidemia, unspecified: Secondary | ICD-10-CM | POA: Insufficient documentation

## 2019-01-18 DIAGNOSIS — I4891 Unspecified atrial fibrillation: Secondary | ICD-10-CM | POA: Insufficient documentation

## 2019-01-18 DIAGNOSIS — Z951 Presence of aortocoronary bypass graft: Secondary | ICD-10-CM | POA: Diagnosis not present

## 2019-01-18 DIAGNOSIS — Z7901 Long term (current) use of anticoagulants: Secondary | ICD-10-CM | POA: Diagnosis not present

## 2019-01-18 DIAGNOSIS — K219 Gastro-esophageal reflux disease without esophagitis: Secondary | ICD-10-CM | POA: Insufficient documentation

## 2019-01-18 DIAGNOSIS — I5022 Chronic systolic (congestive) heart failure: Secondary | ICD-10-CM | POA: Diagnosis not present

## 2019-01-18 DIAGNOSIS — I959 Hypotension, unspecified: Secondary | ICD-10-CM | POA: Insufficient documentation

## 2019-01-18 DIAGNOSIS — Z79899 Other long term (current) drug therapy: Secondary | ICD-10-CM | POA: Insufficient documentation

## 2019-01-18 DIAGNOSIS — I252 Old myocardial infarction: Secondary | ICD-10-CM | POA: Diagnosis not present

## 2019-01-18 DIAGNOSIS — G4733 Obstructive sleep apnea (adult) (pediatric): Secondary | ICD-10-CM | POA: Insufficient documentation

## 2019-01-18 DIAGNOSIS — I255 Ischemic cardiomyopathy: Secondary | ICD-10-CM | POA: Insufficient documentation

## 2019-01-18 DIAGNOSIS — I950 Idiopathic hypotension: Secondary | ICD-10-CM

## 2019-01-18 DIAGNOSIS — Z8249 Family history of ischemic heart disease and other diseases of the circulatory system: Secondary | ICD-10-CM | POA: Insufficient documentation

## 2019-01-18 DIAGNOSIS — Z95810 Presence of automatic (implantable) cardiac defibrillator: Secondary | ICD-10-CM | POA: Diagnosis not present

## 2019-01-18 DIAGNOSIS — J449 Chronic obstructive pulmonary disease, unspecified: Secondary | ICD-10-CM | POA: Insufficient documentation

## 2019-01-18 DIAGNOSIS — R5383 Other fatigue: Secondary | ICD-10-CM | POA: Diagnosis present

## 2019-01-18 DIAGNOSIS — I110 Hypertensive heart disease with heart failure: Secondary | ICD-10-CM | POA: Diagnosis not present

## 2019-01-18 DIAGNOSIS — D649 Anemia, unspecified: Secondary | ICD-10-CM | POA: Insufficient documentation

## 2019-01-18 DIAGNOSIS — I2510 Atherosclerotic heart disease of native coronary artery without angina pectoris: Secondary | ICD-10-CM | POA: Insufficient documentation

## 2019-01-18 NOTE — Progress Notes (Signed)
Patient ID: Rick Gilmore, male    DOB: 18-Jun-1941, 78 y.o.   MRN: [id number]  HPI  Mr Daher is a 78 y/o male with a history of thrombocytopenia, obstructive sleep apnea (with CPAP), prostate cancer, MI, HTN, hyperlipidemia, GERD, COPD, atrial fibrillation, anemia, AICD and chronic heart failure.   Reviewed echo done 07/04/17 which showed an EF of 30% along with severe MR/TR. Previous echo was done 09/02/16 and showed an EF of 25% along with severe MR/TR. EF has decreased from 01/16/16 which showed an EF of 30-35% along with mild MR/TR.   Has not been admitted or been in the ED in the last 6 months.  He presents today for his follow-up visit with a chief complaint of minimal fatigue upon moderate exertion. He describes this as chronic in nature having been present for several years. He has associated tinnitus and easy bruising along with this. He denies any difficulty sleeping, dizziness, abdominal distention, palpitations, pedal edema, chest pain, shortness of breath, cough or weight gain. Getting ready to drive to [place] for a couple of weeks but is planning on stopping every couple of hours.   Past Medical History:  Diagnosis Date  . AICD (automatic cardioverter/defibrillator) present   . Anemia   . Arrhythmia    a fib  . Atrial fibrillation ([place])    on Coumadin  . CAD (coronary artery disease)   . Cancer [facility]) 2001   Prostate, XRT and implant  . CHF (congestive heart failure) ([place])   . COPD (chronic obstructive pulmonary disease) ([place])   . Erectile dysfunction   . GERD (gastroesophageal reflux disease)   . Hyperlipidemia   . Hypertension   . Ischemic cardiomyopathy    s/p AICD/pacer 2009, replaced 2010 [facility]  . MI (myocardial infarction) ([place]) 1988  . Personal history of prostate cancer 2001   s/p XRT and implant ([place])  . Prostate cancer ([place])   . Sleep apnea, obstructive    uses CPAP nightly  . Thrombocytopenia ([place])   . Tubular adenoma    colon polyp  . Vitamin B 12  deficiency    Past Surgical History:  Procedure Laterality Date  . CARDIAC CATHETERIZATION    . COLONOSCOPY WITH PROPOFOL [name] 05/30/2015   Procedure: COLONOSCOPY WITH PROPOFOL;  Surgeon: Manya Silvas, MD;  Location: [facility] ENDOSCOPY;  Service: Endoscopy;  Laterality: [name];  . CORONARY ARTERY BYPASS GRAFT  1988  . ESOPHAGOGASTRODUODENOSCOPY [name] 05/30/2015   Procedure: ESOPHAGOGASTRODUODENOSCOPY (EGD);  Surgeon: Manya Silvas, MD;  Location: [facility] ENDOSCOPY;  Service: Endoscopy;  Laterality: [name];  . IMPLANTABLE CARDIOVERTER DEFIBRILLATOR GENERATOR CHANGE  April 2014   Bi V RRR  . INSERT / REPLACE / REMOVE PACEMAKER  2014  . VASECTOMY     Family History  Problem Relation Age of Onset  . Hypertension Mother   . Hypertension Father   . Cancer Father        prostate, throat  . Emphysema Father   . Heart disease Maternal Grandmother   . Heart disease Paternal Grandmother    Social History   Tobacco Use  . Smoking status: Never Smoker  . Smokeless tobacco: Never Used  Substance Use Topics  . Alcohol use: Yes    Alcohol/week: 4.0 standard drinks    Types: 2 Standard drinks or equivalent, 2 Cans of beer per week    Comment: Mixed drinks   No Known Allergies  Prior to Admission medications   Medication Sig Start Date End Date Taking? Authorizing Provider  allopurinol (ZYLOPRIM) 100 MG tablet TAKE 1 TABLET DAILY 06/29/18  Yes Alisa Graff, FNP  [facility] [address] ELLIPTA 62.5-25 MCG/INH AEPB USE 1 INHALATION DAILY 09/26/18  Yes Wilhelmina Mcardle, MD  apixaban Arne [place]) 5 MG TABS tablet Take by mouth. 02/24/18  Yes [provider]  Cyanocobalamin 1000 MCG SUBL Place 1 tablet (1,000 mcg total) under the tongue every morning. 06/15/17  Yes Crecencio Mc, MD  ENTRESTO 24-26 MG Take 1 tablet by mouth 2 (two) times daily. 11/21/18  Yes Crecencio Mc, MD  escitalopram (LEXAPRO) 10 MG tablet TAKE 1 TABLET DAILY 08/21/18  Yes Crecencio Mc, MD  fluticasone (FLONASE) 50 MCG/ACT nasal spray  USE 1 SPRAY IN EACH NOSTRIL DAILY Patient taking differently: 2 sprays.  05/15/18  Yes Wilhelmina Mcardle, MD  isosorbide mononitrate (IMDUR) 30 MG 24 hr tablet Take 30 mg by mouth daily.   Yes [provider]  montelukast (SINGULAIR) 10 MG tablet Take 1 tablet (10 mg total) by mouth at bedtime. 07/20/18  Yes Darylene Price A, FNP  pantoprazole (PROTONIX) 40 MG tablet TAKE 1 TABLET DAILY 04/01/17  Yes Crecencio Mc, MD  potassium chloride SA (K-DUR,KLOR-CON) 20 MEQ tablet TAKE 1 TABLET DAILY 01/15/19  Yes Crecencio Mc, MD  TOPROL XL 100 MG 24 hr tablet TAKE 1 TABLET DAILY WITH OR IMMEDIATELY FOLLOWING A MEAL 06/29/18  Yes Darylene Price A, FNP  torsemide (DEMADEX) 20 MG tablet TAKE 3 TABLETS IN THE MORNING AND 2 TABLETS IN THE EVENING 06/29/18  Yes Hackney, Tina A, FNP  albuterol (PROVENTIL HFA;VENTOLIN HFA) 108 (90 Base) MCG/ACT inhaler Inhale 2 puffs into the lungs every 6 (six) hours as needed for wheezing or shortness of breath. Patient not taking: Reported on 01/18/2019 12/26/18   Jodelle Green, FNP    Review of Systems  Constitutional: Positive for fatigue (minimal). Negative for appetite change and fever.  HENT: Positive for tinnitus. Negative for congestion, ear pain, postnasal drip, sinus pressure and sore throat.   Eyes: Negative.   Respiratory: Negative for cough, chest tightness and shortness of breath.   Cardiovascular: Negative for chest pain, palpitations and leg swelling.  Gastrointestinal: Negative for abdominal distention and abdominal pain.  Endocrine: Negative.   Genitourinary: Negative.   Musculoskeletal: Negative for back pain and neck pain.  Skin: Negative.   Allergic/Immunologic: Negative.   Neurological: Negative for dizziness and light-headedness.  Hematological: Negative for adenopathy. Bruises/bleeds easily.  Psychiatric/Behavioral: Negative for dysphoric mood, sleep disturbance (wearing CPAP at night. sleeping on 1 pillow) and suicidal ideas. The patient is  not nervous/anxious.    Vitals:   01/18/19 1132  BP: 123/86  Pulse: 66  Resp: 18  SpO2: 96%  Weight: 268 lb 6 oz (121.7 kg)  Height: 6\' 2"  (1.88 m)   Wt Readings from Last 3 Encounters:  01/18/19 268 lb 6 oz (121.7 kg)  01/05/19 263 lb 12.8 oz (119.7 kg)  12/26/18 272 lb 12.8 oz (123.7 kg)   Lab Results  Component Value Date   CREATININE 1.27 06/10/2017   CREATININE 1.07 04/19/2016   CREATININE 1.12 01/19/2016    Physical Exam  Constitutional: He is oriented to person, place, and time. He appears well-developed and well-nourished.  HENT:  Head: Normocephalic and atraumatic.  Eyes: Pupils are equal, round, and reactive to light. Conjunctivae are normal.  Neck: Normal range of motion. Neck supple. No JVD present.  Cardiovascular: Normal rate. An irregular rhythm present.  Pulmonary/Chest: Effort normal. He has no wheezes. He  has no rales.  Abdominal: Soft. He exhibits no distension. There is no abdominal tenderness.  Musculoskeletal:        General: No tenderness or edema.  Neurological: He is alert and oriented to person, place, and time.  Skin: Skin is warm and dry.  Psychiatric: He has a normal mood and affect. His behavior is normal. Thought content normal.  Nursing note and vitals reviewed.  Assessment & Plan:  1: Chronic heart failure with reduced ejection fraction- - NYHA class II - euvolemic today - weighing daily at home. Reminded to call for an overnight weight gain of >2 pounds or a weekly weight gain of >5 pounds - weight up 3 pounds from last visit 6 months ago - not adding salt and trying to follow a 2000mg  sodium diet - now on entresto 24/26mg  BID; consider titrating entresto if able in the future - saw cardiologoly Lyndel Pleasure) 08/03/18 - BNP from 01/16/16 was 700.0 - saw pulmonologist Alva Garnet) 02/02/18  2: Hypotension- - BP looks good today  - saw PCP (Guse) 01/05/2019 - BMP from 06/20/17 reviewed and shows sodium 138, potassium 4.8 and GFR  58.64  Medication bottles were reviewed.   Will not make a return appointment for patient at this time. Advised him that he could call back at anytime to make another appointment.

## 2019-01-18 NOTE — Patient Instructions (Signed)
Continue weighing daily and call for an overnight weight gain of > 2 pounds or a weekly weight gain of >5 pounds. 

## 2019-01-19 ENCOUNTER — Encounter: Payer: Self-pay | Admitting: Family

## 2019-02-15 DIAGNOSIS — I5022 Chronic systolic (congestive) heart failure: Secondary | ICD-10-CM | POA: Diagnosis not present

## 2019-02-15 DIAGNOSIS — I255 Ischemic cardiomyopathy: Secondary | ICD-10-CM | POA: Diagnosis not present

## 2019-02-15 DIAGNOSIS — I4821 Permanent atrial fibrillation: Secondary | ICD-10-CM | POA: Diagnosis not present

## 2019-02-15 DIAGNOSIS — I4430 Unspecified atrioventricular block: Secondary | ICD-10-CM | POA: Diagnosis not present

## 2019-02-15 DIAGNOSIS — G4733 Obstructive sleep apnea (adult) (pediatric): Secondary | ICD-10-CM | POA: Diagnosis not present

## 2019-02-15 DIAGNOSIS — I25810 Atherosclerosis of coronary artery bypass graft(s) without angina pectoris: Secondary | ICD-10-CM | POA: Diagnosis not present

## 2019-02-17 ENCOUNTER — Other Ambulatory Visit: Payer: Self-pay | Admitting: Internal Medicine

## 2019-03-12 ENCOUNTER — Ambulatory Visit: Payer: TRICARE For Life (TFL) | Admitting: Podiatry

## 2019-04-19 ENCOUNTER — Encounter: Payer: Self-pay | Admitting: Podiatry

## 2019-04-19 ENCOUNTER — Other Ambulatory Visit: Payer: Self-pay

## 2019-04-19 ENCOUNTER — Ambulatory Visit (INDEPENDENT_AMBULATORY_CARE_PROVIDER_SITE_OTHER): Payer: Medicare Other | Admitting: Podiatry

## 2019-04-19 VITALS — Temp 97.1°F

## 2019-04-19 DIAGNOSIS — M79676 Pain in unspecified toe(s): Secondary | ICD-10-CM

## 2019-04-19 DIAGNOSIS — D689 Coagulation defect, unspecified: Secondary | ICD-10-CM

## 2019-04-19 DIAGNOSIS — B351 Tinea unguium: Secondary | ICD-10-CM | POA: Diagnosis not present

## 2019-04-19 NOTE — Progress Notes (Signed)
This patient presents the office with chief complaint of long thick nails  He states the nails are painful walking and wearing his shoes. He is unable to self treat. He  presents the office today for an evaluation and treatment of his nails and    No pain from callus right forefoot today.  Patient is taking eliquiss.  GENERAL APPEARANCE: Alert, conversant. Appropriately groomed. No acute distress.  VASCULAR: Pedal pulses are  palpable at  [facility] and PT bilateral.  Capillary refill time is immediate to all digits,  Normal temperature gradient.  Digital hair growth is present bilateral  NEUROLOGIC: sensation is normal to 5.07 monofilament at 5/5 sites bilateral.  Light touch is intact bilateral, Muscle strength normal.  MUSCULOSKELETAL: acceptable muscle strength, tone and stability bilateral.  Intrinsic muscluature intact bilateral.  Rectus appearance of foot .  Severe hammer toes 1-5  B/L   DERMATOLOGIC: skin color, texture, and turgor are within normal limits.  No preulcerative lesions or ulcers  are seen, no interdigital maceration noted.  No open lesions present.  .  Porokeratosis/Wart, sub-4 of the right foot  NAILS  thick disfigured discolored hallux toenails, both feet   Onychomycosis hallux nails    Debridement of nails. RTC  10 weeks  for preventive foot care services.       Gardiner Barefoot DPM

## 2019-05-08 DIAGNOSIS — H6062 Unspecified chronic otitis externa, left ear: Secondary | ICD-10-CM | POA: Diagnosis not present

## 2019-05-08 DIAGNOSIS — H903 Sensorineural hearing loss, bilateral: Secondary | ICD-10-CM | POA: Diagnosis not present

## 2019-05-08 DIAGNOSIS — D649 Anemia, unspecified: Secondary | ICD-10-CM | POA: Diagnosis not present

## 2019-05-08 LAB — IRON,TIBC AND FERRITIN PANEL
Iron: 63
TIBC: 319
UIBC: 256

## 2019-05-08 LAB — CBC AND DIFFERENTIAL
HCT: 37 — AB (ref 41–53)
Hemoglobin: 12.8 — AB (ref 13.5–17.5)
Neutrophils Absolute: 4
Platelets: 114 — AB (ref 150–399)
WBC: 6.4

## 2019-05-08 LAB — VITAMIN B12: Vitamin B-12: 767

## 2019-05-09 ENCOUNTER — Other Ambulatory Visit: Payer: Self-pay | Admitting: Pulmonary Disease

## 2019-05-09 ENCOUNTER — Other Ambulatory Visit: Payer: Self-pay | Admitting: Internal Medicine

## 2019-05-10 MED ORDER — PANTOPRAZOLE SODIUM 40 MG PO TBEC: 40.0000 mg | DELAYED_RELEASE_TABLET | Freq: Every day | ORAL | 3 refills | Status: AC

## 2019-05-15 ENCOUNTER — Other Ambulatory Visit: Payer: Self-pay

## 2019-05-15 ENCOUNTER — Ambulatory Visit (INDEPENDENT_AMBULATORY_CARE_PROVIDER_SITE_OTHER): Payer: Medicare Other | Admitting: Internal Medicine

## 2019-05-15 ENCOUNTER — Encounter: Payer: Self-pay | Admitting: Internal Medicine

## 2019-05-15 DIAGNOSIS — D696 Thrombocytopenia, unspecified: Secondary | ICD-10-CM

## 2019-05-15 DIAGNOSIS — I480 Paroxysmal atrial fibrillation: Secondary | ICD-10-CM

## 2019-05-15 DIAGNOSIS — D508 Other iron deficiency anemias: Secondary | ICD-10-CM

## 2019-05-15 DIAGNOSIS — E785 Hyperlipidemia, unspecified: Secondary | ICD-10-CM

## 2019-05-15 DIAGNOSIS — R739 Hyperglycemia, unspecified: Secondary | ICD-10-CM | POA: Diagnosis not present

## 2019-05-15 DIAGNOSIS — J439 Emphysema, unspecified: Secondary | ICD-10-CM

## 2019-05-15 DIAGNOSIS — K766 Portal hypertension: Secondary | ICD-10-CM | POA: Diagnosis not present

## 2019-05-15 NOTE — Progress Notes (Signed)
Virtual Visit via [email or web address]  This visit type was conducted due to national recommendations for restrictions regarding the COVID-19 pandemic (e.g. social distancing).  This format is felt to be most appropriate for this patient at this time.  All issues noted in this document were discussed and addressed.  No physical exam was performed (except for noted visual exam findings with Video Visits).   I connected with@ on 05/15/19 at  2:30 PM EDT by a video enabled telemedicine application or telephone and verified that I am speaking with the correct person using two identifiers. Location patient: home Location provider: work or home office Persons participating in the virtual visit: patient, provider  I discussed the limitations, risks, security and privacy concerns of performing an evaluation and management service by telephone and the availability of in person appointments. I also discussed with the patient that there may be a patient responsible charge related to this service. The patient expressed understanding and agreed to proceed.  Reason for visit: thrombocytopenia, anemia noted  By recent eval with ENT   HPI: 78 yr old male with ischemic dilated cardiomyopathy , last seen by me may 2019 for tinnitus.  Saw ENT recently and CBC B12 and iron done .  He was referred to heme onc and GI  in Dec 2015 for evaluation of thrombocytopenia and IDA. Wit heme positive stools .  Portal hypertension was suggested.  He has not had liver imaging or [place]   He received iron transufsions in 2016 and was lost to follow up  Taking oral b12    ROS: See pertinent positives and negatives per HPI.  Past Medical History:  Diagnosis Date  . AICD (automatic cardioverter/defibrillator) present   . Anemia   . Arrhythmia    a fib  . Atrial fibrillation ([place])    on Coumadin  . CAD (coronary artery disease)   . Cancer [facility]) 2001   Prostate, XRT and implant  . CHF (congestive heart failure) ([place])   . COPD (chronic  obstructive pulmonary disease) ([place])   . Erectile dysfunction   . GERD (gastroesophageal reflux disease)   . Hyperlipidemia   . Hypertension   . Ischemic cardiomyopathy    s/p AICD/pacer 2009, replaced 2010 [facility]  . MI (myocardial infarction) ([place]) 1988  . Personal history of prostate cancer 2001   s/p XRT and implant ([place])  . Prostate cancer ([place])   . Sleep apnea, obstructive    uses CPAP nightly  . Thrombocytopenia ([place])   . Tubular adenoma    colon polyp  . Vitamin B 12 deficiency     Past Surgical History:  Procedure Laterality Date  . CARDIAC CATHETERIZATION    . COLONOSCOPY WITH PROPOFOL [name] 05/30/2015   Procedure: COLONOSCOPY WITH PROPOFOL;  Surgeon: Manya Silvas, MD;  Location[facility] ENDOSCOPY;  Service: Endoscopy;  Laterality: [name];  . CORONARY ARTERY BYPASS GRAFT  1988  . ESOPHAGOGASTRODUODENOSCOPY [name] 05/30/2015   Procedure: ESOPHAGOGASTRODUODENOSCOPY (EGD);  Surgeon: Manya Silvas, MD;  Location: [facility] ENDOSCOPY;  Service: Endoscopy;  Laterality: [name];  . IMPLANTABLE CARDIOVERTER DEFIBRILLATOR GENERATOR CHANGE  April 2014   Bi V RRR  . INSERT / REPLACE / REMOVE PACEMAKER  2014  . VASECTOMY      Family History  Problem Relation Age of Onset  . Hypertension Mother   . Hypertension Father   . Cancer Father        prostate, throat  . Emphysema Father   . Heart disease Maternal Grandmother   . Heart  disease Paternal Grandmother     SOCIAL HX: SINGLE IADL   Current Outpatient Medications:  .  albuterol (PROVENTIL HFA;VENTOLIN HFA) 108 (90 Base) MCG/ACT inhaler, Inhale 2 puffs into the lungs every 6 (six) hours as needed for wheezing or shortness of breath., Disp: 1 Inhaler, Rfl: 1 .  allopurinol (ZYLOPRIM) 100 MG tablet, TAKE 1 TABLET DAILY, Disp: 90 tablet, Rfl: 3 .  ANORO ELLIPTA 62.5-25 MCG/INH AEPB, USE 1 INHALATION DAILY, Disp: 180 each, Rfl: 2 .  apixaban (ELIQUIS) 5 MG TABS tablet, Take by mouth., Disp: , Rfl:  .  Cyanocobalamin 1000 MCG SUBL, Place 1  tablet (1,000 mcg total) under the tongue every morning., Disp: 90 tablet, Rfl: 3 .  DERMOTIC 0.01 % OIL, , Disp: , Rfl:  .  ENTRESTO 24-26 MG, Take 1 tablet by mouth 2 (two) times daily., Disp: 180 tablet, Rfl: 1 .  escitalopram (LEXAPRO) 10 MG tablet, TAKE 1 TABLET DAILY, Disp: 90 tablet, Rfl: 1 .  fluticasone (FLONASE) 50 MCG/ACT nasal spray, USE 1 SPRAY IN EACH NOSTRIL DAILY (Patient taking differently: 2 sprays. ), Disp: 48 g, Rfl: 3 .  isosorbide mononitrate (IMDUR) 30 MG 24 hr tablet, Take 30 mg by mouth daily., Disp: , Rfl: 11 .  montelukast (SINGULAIR) 10 MG tablet, Take 1 tablet (10 mg total) by mouth at bedtime., Disp: 90 tablet, Rfl: 3 .  pantoprazole (PROTONIX) 40 MG tablet, TAKE 1 TABLET DAILY., Disp: 90 tablet, Rfl: 3 .  pantoprazole (PROTONIX) 40 MG tablet, Take 1 tablet (40 mg total) by mouth daily., Disp: 90 tablet, Rfl: 3 .  potassium chloride SA (K-DUR,KLOR-CON) 20 MEQ tablet, TAKE 1 TABLET DAILY, Disp: 90 tablet, Rfl: 1 .  TOPROL XL 100 MG 24 hr tablet, TAKE 1 TABLET DAILY WITH OR IMMEDIATELY FOLLOWING A MEAL, Disp: 90 tablet, Rfl: 3 .  torsemide (DEMADEX) 20 MG tablet, TAKE 3 TABLETS IN THE MORNING AND 2 TABLETS IN THE EVENING, Disp: 450 tablet, Rfl: 3  EXAM:  VITALS per patient if applicable:  GENERAL: alert, oriented, appears well and in no acute distress  HEENT: atraumatic, conjunttiva clear, no obvious abnormalities on inspection of external nose and ears  NECK: normal movements of the head and neck  LUNGS: on inspection no signs of respiratory distress, breathing rate appears normal, no obvious gross SOB, gasping or wheezing  CV: no obvious cyanosis  MS: moves all visible extremities without noticeable abnormality  PSYCH/NEURO: pleasant and cooperative, no obvious depression or anxiety, speech and thought processing grossly intact  ASSESSMENT AND PLAN:  Discussed the following assessment and plan:  Portal hypertension (HCC) - Plan: Comprehensive metabolic  panel, [place] Art/Ven Flow Abd Pelv Doppler  Hyperlipidemia LDL goal <70 - Plan: Lipid panel  Hyperglycemia - Plan: Hemoglobin A1c  Thrombocytopenia (HCC)  Other iron deficiency anemia  Pulmonary emphysema, unspecified emphysema type (HCC)  Paroxysmal atrial fibrillation (HCC)  Thrombocytopenia Chronic, mid  Prior workup unclear by Alyssa Grove  Iron deficiency anemia Treated in the past with iron transfusion .  repeat levels pending   COPD (chronic obstructive pulmonary disease) (HCC) Mild,  Complicated by OSA.  Managed by Pulmonology. No recent exacerbations  Atrial fibrillation (HCC) S/p pacer,  Anticoagulated with Eliquis    I discussed the assessment and treatment plan with the patient. The patient was provided an opportunity to ask questions and all were answered. The patient agreed with the plan and demonstrated an understanding of the instructions.   The patient was advised to call back or seek  an in-person evaluation if the symptoms worsen or if the condition fails to improve as anticipated.  I provided 25 minutes of non-face-to-face time during this encounter.   Crecencio Mc, MD

## 2019-05-16 NOTE — Assessment & Plan Note (Signed)
S/p pacer,  Anticoagulated with Eliquis

## 2019-05-16 NOTE — Assessment & Plan Note (Signed)
Chronic, mid  Prior workup unclear by Alyssa Grove

## 2019-05-16 NOTE — Assessment & Plan Note (Signed)
Treated in the past with iron transfusion .  repeat levels pending

## 2019-05-16 NOTE — Assessment & Plan Note (Signed)
Mild,  Complicated by OSA.  Managed by Pulmonology. No recent exacerbations

## 2019-05-28 ENCOUNTER — Other Ambulatory Visit (INDEPENDENT_AMBULATORY_CARE_PROVIDER_SITE_OTHER): Payer: Medicare Other

## 2019-05-28 ENCOUNTER — Other Ambulatory Visit: Payer: Self-pay

## 2019-05-28 ENCOUNTER — Telehealth: Payer: Self-pay

## 2019-05-28 DIAGNOSIS — R739 Hyperglycemia, unspecified: Secondary | ICD-10-CM | POA: Diagnosis not present

## 2019-05-28 DIAGNOSIS — K766 Portal hypertension: Secondary | ICD-10-CM

## 2019-05-28 DIAGNOSIS — E785 Hyperlipidemia, unspecified: Secondary | ICD-10-CM | POA: Diagnosis not present

## 2019-05-28 LAB — LIPID PANEL
Cholesterol: 147 mg/dL (ref 0–200)
HDL: 38.5 mg/dL — ABNORMAL LOW (ref >39.00)
LDL Cholesterol: 89 mg/dL (ref 0–99)
NonHDL: 108.97
Total CHOL/HDL Ratio: 4
Triglycerides: 102 mg/dL (ref 0.0–149.0)
VLDL: 20.4 mg/dL (ref 0.0–40.0)

## 2019-05-28 LAB — COMPREHENSIVE METABOLIC PANEL
ALT: 8 U/L (ref 0–53)
AST: 12 U/L (ref 0–37)
Albumin: 4.2 g/dL (ref 3.5–5.2)
Alkaline Phosphatase: 60 U/L (ref 39–117)
BUN: 25 mg/dL — ABNORMAL HIGH (ref 6–23)
CO2: 27 mEq/L (ref 19–32)
Calcium: 8.8 mg/dL (ref 8.4–10.5)
Chloride: 103 mEq/L (ref 96–112)
Creatinine, Ser: 1.13 mg/dL (ref 0.40–1.50)
GFR: 62.81 mL/min (ref >60.00)
Glucose, Bld: 105 mg/dL — ABNORMAL HIGH (ref 70–99)
Potassium: 3.7 mEq/L (ref 3.5–5.1)
Sodium: 140 mEq/L (ref 135–145)
Total Bilirubin: 0.8 mg/dL (ref 0.2–1.2)
Total Protein: 6.2 g/dL (ref 6.0–8.3)

## 2019-05-28 LAB — HEMOGLOBIN A1C: Hgb A1c MFr Bld: 5.9 % (ref 4.6–6.5)

## 2019-05-28 NOTE — Telephone Encounter (Signed)
Copied from [place] [id number]. Topic: General - Other >> May 28, 2019  8:39 AM Carolyn Stare wrote: Denton Ar with [facility] [place]  req a call back the order that is in is not correct for what Dr Derrel Nip is looking for

## 2019-05-29 ENCOUNTER — Ambulatory Visit: Admission: RE | Admit: 2019-05-29 | Payer: Medicare Other | Source: Ambulatory Visit

## 2019-05-29 DIAGNOSIS — Z86010 Personal history of colonic polyps: Secondary | ICD-10-CM | POA: Diagnosis not present

## 2019-05-29 DIAGNOSIS — K3189 Other diseases of stomach and duodenum: Secondary | ICD-10-CM | POA: Diagnosis not present

## 2019-05-29 DIAGNOSIS — I4821 Permanent atrial fibrillation: Secondary | ICD-10-CM | POA: Diagnosis not present

## 2019-05-29 DIAGNOSIS — D696 Thrombocytopenia, unspecified: Secondary | ICD-10-CM | POA: Diagnosis not present

## 2019-05-29 DIAGNOSIS — Z5181 Encounter for therapeutic drug level monitoring: Secondary | ICD-10-CM | POA: Diagnosis not present

## 2019-05-29 DIAGNOSIS — Z7901 Long term (current) use of anticoagulants: Secondary | ICD-10-CM | POA: Diagnosis not present

## 2019-05-29 DIAGNOSIS — K219 Gastro-esophageal reflux disease without esophagitis: Secondary | ICD-10-CM | POA: Diagnosis not present

## 2019-05-29 DIAGNOSIS — I5022 Chronic systolic (congestive) heart failure: Secondary | ICD-10-CM | POA: Diagnosis not present

## 2019-05-29 DIAGNOSIS — K766 Portal hypertension: Secondary | ICD-10-CM | POA: Diagnosis not present

## 2019-05-29 DIAGNOSIS — Z95810 Presence of automatic (implantable) cardiac defibrillator: Secondary | ICD-10-CM | POA: Diagnosis not present

## 2019-05-29 LAB — HM COLONOSCOPY

## 2019-05-29 NOTE — Telephone Encounter (Signed)
Absolutely!  Thank you.

## 2019-05-29 NOTE — Telephone Encounter (Signed)
Spoke with Colletta Maryland at [facility] [place] and she stated that the [place] that was ordered was not the correct no to rule out portal hypertension. Colletta Maryland stated that she is going to change the order to the correct one if you can just sign off on the order.

## 2019-05-30 DIAGNOSIS — I4821 Permanent atrial fibrillation: Secondary | ICD-10-CM | POA: Diagnosis not present

## 2019-05-31 ENCOUNTER — Ambulatory Visit: Payer: Medicare Other

## 2019-05-31 ENCOUNTER — Ambulatory Visit
Admission: RE | Admit: 2019-05-31 | Discharge: 2019-05-31 | Disposition: A | Discharge status: Home / Self Care | Payer: Medicare Other | Source: Ambulatory Visit | Attending: Internal Medicine | Admitting: Internal Medicine

## 2019-05-31 ENCOUNTER — Other Ambulatory Visit: Payer: Self-pay

## 2019-05-31 DIAGNOSIS — K7689 Other specified diseases of liver: Secondary | ICD-10-CM | POA: Diagnosis not present

## 2019-05-31 DIAGNOSIS — K766 Portal hypertension: Secondary | ICD-10-CM | POA: Diagnosis not present

## 2019-05-31 DIAGNOSIS — R161 Splenomegaly, not elsewhere classified: Secondary | ICD-10-CM | POA: Diagnosis not present

## 2019-05-31 DIAGNOSIS — R7989 Other specified abnormal findings of blood chemistry: Secondary | ICD-10-CM | POA: Diagnosis not present

## 2019-06-05 ENCOUNTER — Other Ambulatory Visit: Payer: Self-pay

## 2019-06-07 ENCOUNTER — Other Ambulatory Visit: Payer: Self-pay | Admitting: Nurse Practitioner

## 2019-06-07 DIAGNOSIS — D696 Thrombocytopenia, unspecified: Secondary | ICD-10-CM

## 2019-06-07 DIAGNOSIS — K3189 Other diseases of stomach and duodenum: Secondary | ICD-10-CM

## 2019-06-07 DIAGNOSIS — K766 Portal hypertension: Secondary | ICD-10-CM

## 2019-06-12 ENCOUNTER — Other Ambulatory Visit: Payer: Self-pay | Admitting: Pulmonary Disease

## 2019-06-12 ENCOUNTER — Other Ambulatory Visit: Payer: Self-pay | Admitting: Family

## 2019-06-12 ENCOUNTER — Other Ambulatory Visit: Payer: Self-pay | Admitting: Internal Medicine

## 2019-06-14 DIAGNOSIS — Z86010 Personal history of colonic polyps: Secondary | ICD-10-CM | POA: Diagnosis not present

## 2019-06-14 DIAGNOSIS — Z7901 Long term (current) use of anticoagulants: Secondary | ICD-10-CM | POA: Diagnosis not present

## 2019-06-14 DIAGNOSIS — Z5181 Encounter for therapeutic drug level monitoring: Secondary | ICD-10-CM | POA: Diagnosis not present

## 2019-06-18 ENCOUNTER — Ambulatory Visit
Admission: RE | Admit: 2019-06-18 | Discharge: 2019-06-18 | Disposition: A | Discharge status: Home / Self Care | Payer: Medicare Other | Source: Ambulatory Visit | Attending: Nurse Practitioner | Admitting: Nurse Practitioner

## 2019-06-18 ENCOUNTER — Other Ambulatory Visit: Payer: Self-pay

## 2019-06-18 DIAGNOSIS — J90 Pleural effusion, not elsewhere classified: Secondary | ICD-10-CM | POA: Diagnosis not present

## 2019-06-18 DIAGNOSIS — K766 Portal hypertension: Secondary | ICD-10-CM | POA: Diagnosis not present

## 2019-06-18 DIAGNOSIS — K3189 Other diseases of stomach and duodenum: Secondary | ICD-10-CM | POA: Insufficient documentation

## 2019-06-18 DIAGNOSIS — R161 Splenomegaly, not elsewhere classified: Secondary | ICD-10-CM | POA: Diagnosis not present

## 2019-06-18 DIAGNOSIS — D696 Thrombocytopenia, unspecified: Secondary | ICD-10-CM | POA: Diagnosis not present

## 2019-06-18 DIAGNOSIS — K8020 Calculus of gallbladder without cholecystitis without obstruction: Secondary | ICD-10-CM | POA: Diagnosis not present

## 2019-06-28 ENCOUNTER — Ambulatory Visit (INDEPENDENT_AMBULATORY_CARE_PROVIDER_SITE_OTHER): Payer: Medicare Other | Admitting: Podiatry

## 2019-06-28 ENCOUNTER — Other Ambulatory Visit: Payer: Self-pay

## 2019-06-28 ENCOUNTER — Encounter: Payer: Self-pay | Admitting: Podiatry

## 2019-06-28 VITALS — Temp 97.8°F

## 2019-06-28 DIAGNOSIS — M79676 Pain in unspecified toe(s): Secondary | ICD-10-CM | POA: Diagnosis not present

## 2019-06-28 DIAGNOSIS — B351 Tinea unguium: Secondary | ICD-10-CM | POA: Diagnosis not present

## 2019-06-28 DIAGNOSIS — D689 Coagulation defect, unspecified: Secondary | ICD-10-CM

## 2019-06-28 NOTE — Progress Notes (Signed)
This patient presents the office with chief complaint of long thick nails  He states the nails are painful walking and wearing his shoes. He is unable to self treat. He  presents the office today for an evaluation and treatment of his nails and    No pain from callus right forefoot today.  Patient is taking eliquiss.  GENERAL APPEARANCE: Alert, conversant. Appropriately groomed. No acute distress.  VASCULAR: Pedal pulses are  palpable at  [facility] and PT bilateral.  Capillary refill time is immediate to all digits,  Normal temperature gradient.  Digital hair growth is present bilateral  NEUROLOGIC: sensation is normal to 5.07 monofilament at 5/5 sites bilateral.  Light touch is intact bilateral, Muscle strength normal.  MUSCULOSKELETAL: acceptable muscle strength, tone and stability bilateral.  Intrinsic muscluature intact bilateral.  Rectus appearance of foot .  Severe hammer toes 1-5  B/L   DERMATOLOGIC: skin color, texture, and turgor are within normal limits.  No preulcerative lesions or ulcers  are seen, no interdigital maceration noted.  No open lesions present.  Marland Kitchen   NAILS  thick disfigured discolored hallux toenails, both feet   Onychomycosis hallux nails    Debridement of nails. RTC  10 weeks  for preventive foot care services.       Gardiner Barefoot DPM

## 2019-07-04 DIAGNOSIS — I4821 Permanent atrial fibrillation: Secondary | ICD-10-CM | POA: Diagnosis not present

## 2019-07-04 DIAGNOSIS — Z951 Presence of aortocoronary bypass graft: Secondary | ICD-10-CM | POA: Diagnosis not present

## 2019-07-04 DIAGNOSIS — E782 Mixed hyperlipidemia: Secondary | ICD-10-CM | POA: Diagnosis not present

## 2019-07-04 DIAGNOSIS — G4733 Obstructive sleep apnea (adult) (pediatric): Secondary | ICD-10-CM | POA: Diagnosis not present

## 2019-07-04 DIAGNOSIS — I255 Ischemic cardiomyopathy: Secondary | ICD-10-CM | POA: Diagnosis not present

## 2019-07-04 DIAGNOSIS — I5022 Chronic systolic (congestive) heart failure: Secondary | ICD-10-CM | POA: Diagnosis not present

## 2019-07-04 DIAGNOSIS — I4430 Unspecified atrioventricular block: Secondary | ICD-10-CM | POA: Diagnosis not present

## 2019-07-04 DIAGNOSIS — I10 Essential (primary) hypertension: Secondary | ICD-10-CM | POA: Diagnosis not present

## 2019-07-04 DIAGNOSIS — I25810 Atherosclerosis of coronary artery bypass graft(s) without angina pectoris: Secondary | ICD-10-CM | POA: Diagnosis not present

## 2019-07-06 MED ORDER — ALLOPURINOL 100 MG PO TABS: 100.0000 mg | ORAL_TABLET | Freq: Every day | ORAL | 3 refills | Status: DC

## 2019-07-06 MED ORDER — ESCITALOPRAM OXALATE 10 MG PO TABS: 10.0000 mg | ORAL_TABLET | Freq: Every day | ORAL | 3 refills | Status: DC

## 2019-07-06 MED ORDER — MONTELUKAST SODIUM 10 MG PO TABS: 10.0000 mg | ORAL_TABLET | Freq: Every day | ORAL | 3 refills | Status: DC

## 2019-07-12 ENCOUNTER — Other Ambulatory Visit: Payer: Self-pay

## 2019-07-12 ENCOUNTER — Other Ambulatory Visit
Admission: RE | Admit: 2019-07-12 | Discharge: 2019-07-12 | Disposition: A | Discharge status: Home / Self Care | Payer: Medicare Other | Source: Ambulatory Visit | Attending: Internal Medicine | Admitting: Internal Medicine

## 2019-07-12 DIAGNOSIS — Z01812 Encounter for preprocedural laboratory examination: Secondary | ICD-10-CM | POA: Insufficient documentation

## 2019-07-12 DIAGNOSIS — Z20828 Contact with and (suspected) exposure to other viral communicable diseases: Secondary | ICD-10-CM | POA: Insufficient documentation

## 2019-07-12 LAB — SARS CORONAVIRUS 2 (TAT 6-24 HRS): SARS Coronavirus 2: NEGATIVE

## 2019-07-13 ENCOUNTER — Encounter: Payer: Self-pay | Admitting: *Deleted

## 2019-07-16 ENCOUNTER — Other Ambulatory Visit: Payer: Self-pay

## 2019-07-16 ENCOUNTER — Ambulatory Visit: Payer: Medicare Other | Admitting: Certified Registered"

## 2019-07-16 ENCOUNTER — Ambulatory Visit
Admission: RE | Admit: 2019-07-16 | Discharge: 2019-07-16 | Disposition: A | Discharge status: Home / Self Care | Payer: Medicare Other | Attending: Internal Medicine | Admitting: Internal Medicine

## 2019-07-16 ENCOUNTER — Encounter: Payer: Self-pay | Admitting: *Deleted

## 2019-07-16 ENCOUNTER — Encounter
Admission: RE | Disposition: A | Discharge status: Home / Self Care | Payer: Self-pay | Source: Home / Self Care | Attending: Internal Medicine

## 2019-07-16 DIAGNOSIS — D122 Benign neoplasm of ascending colon: Secondary | ICD-10-CM | POA: Insufficient documentation

## 2019-07-16 DIAGNOSIS — R195 Other fecal abnormalities: Secondary | ICD-10-CM | POA: Diagnosis not present

## 2019-07-16 DIAGNOSIS — K228 Other specified diseases of esophagus: Secondary | ICD-10-CM | POA: Insufficient documentation

## 2019-07-16 DIAGNOSIS — K766 Portal hypertension: Secondary | ICD-10-CM | POA: Diagnosis not present

## 2019-07-16 DIAGNOSIS — K219 Gastro-esophageal reflux disease without esophagitis: Secondary | ICD-10-CM | POA: Diagnosis not present

## 2019-07-16 DIAGNOSIS — I252 Old myocardial infarction: Secondary | ICD-10-CM | POA: Insufficient documentation

## 2019-07-16 DIAGNOSIS — Z86010 Personal history of colonic polyps: Secondary | ICD-10-CM | POA: Diagnosis not present

## 2019-07-16 DIAGNOSIS — N189 Chronic kidney disease, unspecified: Secondary | ICD-10-CM | POA: Diagnosis not present

## 2019-07-16 DIAGNOSIS — D124 Benign neoplasm of descending colon: Secondary | ICD-10-CM | POA: Insufficient documentation

## 2019-07-16 DIAGNOSIS — G4733 Obstructive sleep apnea (adult) (pediatric): Secondary | ICD-10-CM | POA: Diagnosis not present

## 2019-07-16 DIAGNOSIS — D120 Benign neoplasm of cecum: Secondary | ICD-10-CM | POA: Diagnosis not present

## 2019-07-16 DIAGNOSIS — K3189 Other diseases of stomach and duodenum: Secondary | ICD-10-CM | POA: Diagnosis not present

## 2019-07-16 DIAGNOSIS — I4891 Unspecified atrial fibrillation: Secondary | ICD-10-CM | POA: Insufficient documentation

## 2019-07-16 DIAGNOSIS — Z79899 Other long term (current) drug therapy: Secondary | ICD-10-CM | POA: Insufficient documentation

## 2019-07-16 DIAGNOSIS — D123 Benign neoplasm of transverse colon: Secondary | ICD-10-CM | POA: Diagnosis not present

## 2019-07-16 DIAGNOSIS — K210 Gastro-esophageal reflux disease with esophagitis: Secondary | ICD-10-CM | POA: Diagnosis not present

## 2019-07-16 DIAGNOSIS — Z7901 Long term (current) use of anticoagulants: Secondary | ICD-10-CM | POA: Diagnosis not present

## 2019-07-16 DIAGNOSIS — I110 Hypertensive heart disease with heart failure: Secondary | ICD-10-CM | POA: Insufficient documentation

## 2019-07-16 DIAGNOSIS — Z95810 Presence of automatic (implantable) cardiac defibrillator: Secondary | ICD-10-CM | POA: Diagnosis not present

## 2019-07-16 DIAGNOSIS — K5730 Diverticulosis of large intestine without perforation or abscess without bleeding: Secondary | ICD-10-CM | POA: Diagnosis not present

## 2019-07-16 DIAGNOSIS — K5790 Diverticulosis of intestine, part unspecified, without perforation or abscess without bleeding: Secondary | ICD-10-CM | POA: Diagnosis not present

## 2019-07-16 DIAGNOSIS — J449 Chronic obstructive pulmonary disease, unspecified: Secondary | ICD-10-CM | POA: Diagnosis not present

## 2019-07-16 DIAGNOSIS — Z8546 Personal history of malignant neoplasm of prostate: Secondary | ICD-10-CM | POA: Insufficient documentation

## 2019-07-16 DIAGNOSIS — I5022 Chronic systolic (congestive) heart failure: Secondary | ICD-10-CM | POA: Diagnosis not present

## 2019-07-16 DIAGNOSIS — K648 Other hemorrhoids: Secondary | ICD-10-CM | POA: Diagnosis not present

## 2019-07-16 DIAGNOSIS — I255 Ischemic cardiomyopathy: Secondary | ICD-10-CM | POA: Insufficient documentation

## 2019-07-16 DIAGNOSIS — I509 Heart failure, unspecified: Secondary | ICD-10-CM | POA: Insufficient documentation

## 2019-07-16 DIAGNOSIS — E785 Hyperlipidemia, unspecified: Secondary | ICD-10-CM | POA: Diagnosis not present

## 2019-07-16 DIAGNOSIS — K2950 Unspecified chronic gastritis without bleeding: Secondary | ICD-10-CM | POA: Diagnosis not present

## 2019-07-16 DIAGNOSIS — I2510 Atherosclerotic heart disease of native coronary artery without angina pectoris: Secondary | ICD-10-CM | POA: Insufficient documentation

## 2019-07-16 DIAGNOSIS — K640 First degree hemorrhoids: Secondary | ICD-10-CM | POA: Insufficient documentation

## 2019-07-16 DIAGNOSIS — K2960 Other gastritis without bleeding: Secondary | ICD-10-CM | POA: Diagnosis not present

## 2019-07-16 DIAGNOSIS — K635 Polyp of colon: Secondary | ICD-10-CM | POA: Diagnosis not present

## 2019-07-16 DIAGNOSIS — I130 Hypertensive heart and chronic kidney disease with heart failure and stage 1 through stage 4 chronic kidney disease, or unspecified chronic kidney disease: Secondary | ICD-10-CM | POA: Diagnosis not present

## 2019-07-16 HISTORY — PX: ESOPHAGOGASTRODUODENOSCOPY (EGD) WITH PROPOFOL: SHX5813

## 2019-07-16 HISTORY — PX: COLONOSCOPY WITH PROPOFOL: SHX5780

## 2019-07-16 SURGERY — ESOPHAGOGASTRODUODENOSCOPY (EGD) WITH PROPOFOL
Anesthesia: General

## 2019-07-16 MED ORDER — PROPOFOL 10 MG/ML IV BOLUS
INTRAVENOUS | Status: AC
  Filled 2019-07-16: qty 20

## 2019-07-16 MED ORDER — PHENYLEPHRINE HCL (PRESSORS) 10 MG/ML IV SOLN
INTRAVENOUS | Status: AC
  Filled 2019-07-16: qty 1

## 2019-07-16 MED ORDER — SODIUM CHLORIDE 0.9 % IV SOLN
INTRAVENOUS | Status: DC
  Administered 2019-07-16: 13:00:00 via INTRAVENOUS

## 2019-07-16 MED ORDER — EPHEDRINE SULFATE 50 MG/ML IJ SOLN
INTRAMUSCULAR | Status: AC
  Filled 2019-07-16: qty 1

## 2019-07-16 MED ORDER — LIDOCAINE HCL (PF) 2 % IJ SOLN
INTRAMUSCULAR | Status: AC
  Filled 2019-07-16: qty 30

## 2019-07-16 MED ORDER — PROPOFOL 10 MG/ML IV BOLUS
INTRAVENOUS | Status: DC | PRN
  Administered 2019-07-16 (×8): 50 mg via INTRAVENOUS

## 2019-07-16 MED ORDER — PROPOFOL 500 MG/50ML IV EMUL
INTRAVENOUS | Status: AC
  Filled 2019-07-16: qty 50

## 2019-07-16 MED ORDER — GLYCOPYRROLATE 0.2 MG/ML IJ SOLN
INTRAMUSCULAR | Status: AC
  Filled 2019-07-16: qty 3

## 2019-07-16 MED ORDER — PHENYLEPHRINE HCL (PRESSORS) 10 MG/ML IV SOLN
INTRAVENOUS | Status: DC | PRN
  Administered 2019-07-16 (×4): 100 ug via INTRAVENOUS

## 2019-07-16 MED ORDER — PROPOFOL 500 MG/50ML IV EMUL
INTRAVENOUS | Status: DC | PRN
  Administered 2019-07-16: 120 ug/kg/min via INTRAVENOUS

## 2019-07-16 NOTE — Op Note (Signed)
[facility] Gastroenterology Patient Name: Rick Gilmore Procedure Date: 07/16/2019 1:51 PM MRN: [id number] Account #: [id number] Date of Birth: [birth year] Admit Type: Outpatient Age: 78 Room: [facility] ENDO ROOM 1 Gender: Male Note Status: Finalized Procedure:            Colonoscopy Indications:          Heme positive stool Providers:            Benay Pike. Sirenia Whitis MD, MD Medicines:            Propofol per Anesthesia Complications:        No immediate complications. Procedure:            Pre-Anesthesia Assessment:                       - The risks and benefits of the procedure and the                        sedation options and risks were discussed with the                        patient. All questions were answered and informed                        consent was obtained.                       - Patient identification and proposed procedure were                        verified prior to the procedure by the physician. The                        procedure was verified in the procedure room.                       - ASA Grade Assessment: III - A patient with severe                        systemic disease.                       - After reviewing the risks and benefits, the patient                        was deemed in satisfactory condition to undergo the                        procedure.                       After obtaining informed consent, the colonoscope was                        passed under direct vision. Throughout the procedure,                        the patient's blood pressure, pulse, and oxygen                        saturations were monitored continuously. The  Colonoscope was introduced through the anus and                        advanced to the the cecum, identified by appendiceal                        orifice and ileocecal valve. The colonoscopy was                        performed without difficulty. The patient tolerated the                    procedure well. The quality of the bowel preparation                        was adequate. The ileocecal valve, appendiceal orifice,                        and rectum were photographed. Findings:      The perianal and digital rectal examinations were normal. Pertinent       negatives include normal sphincter tone and no palpable rectal lesions.      Multiple small and large-mouthed diverticula were found in the left       colon.      Five semi-pedunculated polyps were found in the cecum. The polyps were 3       to 7 mm in size. These polyps were removed with a cold snare. Resection       and retrieval were complete.      A 7 mm polyp was found in the transverse colon. The polyp was sessile.       The polyp was removed with a cold snare. Resection and retrieval were       complete.      Two semi-pedunculated polyps were found in the ascending colon. The       polyps were 5 to 7 mm in size. These polyps were removed with a cold       snare. Resection and retrieval were complete.      Two sessile polyps were found in the descending colon. The polyps were 4       to 6 mm in size. These polyps were removed with a cold snare. Resection       and retrieval were complete.      Non-bleeding internal hemorrhoids were found during retroflexion. The       hemorrhoids were Grade I (internal hemorrhoids that do not prolapse).      The exam was otherwise without abnormality. Impression:           - Diverticulosis in the left colon.                       - Five 3 to 7 mm polyps in the cecum, removed with a                        cold snare. Resected and retrieved.                       - One 7 mm polyp in the transverse colon, removed with                        a cold  snare. Resected and retrieved.                       - Two 5 to 7 mm polyps in the ascending colon, removed                        with a cold snare. Resected and retrieved.                       - Two 4 to 6 mm polyps in  the descending colon, removed                        with a cold snare. Resected and retrieved.                       - Non-bleeding internal hemorrhoids.                       - The examination was otherwise normal. Recommendation:       - Await pathology results from EGD, also performed                        today.                       - Patient has a contact number available for                        emergencies. The signs and symptoms of potential                        delayed complications were discussed with the patient.                        Return to normal activities tomorrow. Written discharge                        instructions were provided to the patient.                       - Resume previous diet.                       - Continue present medications.                       - Await pathology results.                       - Repeat colonoscopy is recommended for surveillance.                        The colonoscopy date will be determined after pathology                        results from today's exam become available for review.                       - Return to physician assistant in 3 months.                       - The findings and  recommendations were discussed with                        the patient.                       - You should follow up with Laurine Blazer, PA-C at the                        time of your office visit. Procedure Code(s):    --- Professional ---                       [phone], Colonoscopy, flexible; with removal of tumor(s),                        polyp(s), or other lesion(s) by snare technique Diagnosis Code(s):    --- Professional ---                       K64.0, First degree hemorrhoids                       K63.5, Polyp of colon                       R19.5, Other fecal abnormalities                       K57.30, Diverticulosis of large intestine without                        perforation or abscess without bleeding CPT copyright 2019  American Medical Association. All rights reserved. The codes documented in this report are preliminary and upon coder review may  be revised to meet current compliance requirements. Efrain Sella MD, MD 07/16/2019 2:40:48 PM This report has been signed electronically. Number of Addenda: 0 Note Initiated On: 07/16/2019 1:51 PM Scope Withdrawal Time: 0 hours 13 minutes 31 seconds  Total Procedure Duration: 0 hours 17 minutes 32 seconds  Estimated Blood Loss: Estimated blood loss was minimal.      [facility]

## 2019-07-16 NOTE — Anesthesia Post-op Follow-up Note (Signed)
Anesthesia QCDR form completed.        

## 2019-07-16 NOTE — Anesthesia Preprocedure Evaluation (Signed)
Anesthesia Evaluation  Patient identified by MRN, date of birth, ID band Patient awake    Reviewed: Allergy & Precautions, NPO status , Patient's Chart, lab work & pertinent test results, reviewed documented beta blocker date and time   History of Anesthesia Complications Negative for: history of anesthetic complications  Airway Mallampati: II  TM Distance: >3 FB     Dental  (+) Chipped, Dental Advidsory Given, Caps, Missing   Pulmonary shortness of breath and with exertion, sleep apnea and Continuous Positive Airway Pressure Ventilation , COPD, neg recent URI,    Pulmonary exam normal breath sounds clear to auscultation       Cardiovascular hypertension, (-) angina+ CAD, + Past MI, + Cardiac Stents, + CABG and +CHF  + dysrhythmias Atrial Fibrillation + Cardiac Defibrillator (-) Valvular Problems/Murmurs  EF 30-35%   Neuro/Psych negative neurological ROS  negative psych ROS   GI/Hepatic Neg liver ROS, GERD  Medicated and Controlled,  Endo/Other  negative endocrine ROS  Renal/GU CRFRenal disease  negative genitourinary   Musculoskeletal negative musculoskeletal ROS (+)   Abdominal Normal abdominal exam  (+)   Peds negative pediatric ROS (+)  Hematology  (+) Blood dyscrasia, anemia , Hx of thrombocytopenia   Anesthesia Other Findings Medtronic defif/Pacemaker  Reproductive/Obstetrics                             Anesthesia Physical  Anesthesia Plan  ASA: III  Anesthesia Plan: General   Post-op Pain Management:    Induction: Intravenous  PONV Risk Score and Plan: 2 and Propofol infusion and TIVA  Airway Management Planned: Nasal Cannula and Natural Airway  Additional Equipment:   Intra-op Plan:   Post-operative Plan:   Informed Consent: I have reviewed the patients History and Physical, chart, labs and discussed the procedure including the risks, benefits and alternatives for  the proposed anesthesia with the patient or authorized representative who has indicated his/her understanding and acceptance.     Dental advisory given  Plan Discussed with: CRNA and Surgeon  Anesthesia Plan Comments:         Anesthesia Quick Evaluation

## 2019-07-16 NOTE — H&P (Signed)
Outpatient short stay form Pre-procedure 07/16/2019 1:45 PM Cote Mayabb K. Alice Reichert, M.D.  Primary Physician: Deborra Medina, M.D.  Reason for visit:  Personal hx of adenomatous colon polyps - 2016, Portal HTN gastropathy.  History of present illness:                            Patient presents for colonoscopy for a personal hx of colon polyps. The patient denies abdominal pain, abnormal weight loss or rectal bleeding.  Has personal hx of GERD and portal hypertensive gastroathy. Stable without bleeding in the form melena or hemetemesis.  On Eliquis for afib and has AICD. Eliquis held for these procedures.    Current Facility-Administered Medications:  .  0.9 %  sodium chloride infusion, , Intravenous, Continuous, [place], Benay Pike, MD, Last Rate: 20 mL/hr at 07/16/19 1310  Medications Prior to Admission  Medication Sig Dispense Refill Last Dose  . albuterol (PROVENTIL HFA;VENTOLIN HFA) 108 (90 Base) MCG/ACT inhaler Inhale 2 puffs into the lungs every 6 (six) hours as needed for wheezing or shortness of breath. 1 Inhaler 1 07/16/2019 at Unknown time  . allopurinol (ZYLOPRIM) 100 MG tablet Take 1 tablet (100 mg total) by mouth daily. 90 tablet 3 07/15/2019 at Unknown time  . colchicine 0.6 MG tablet Take 0.6 mg by mouth daily.   07/15/2019 at Unknown time  . ENTRESTO 24-26 MG TAKE 1 TABLET TWICE A DAY 180 tablet 3 07/16/2019 at Unknown time  . escitalopram (LEXAPRO) 10 MG tablet Take 1 tablet (10 mg total) by mouth daily. 90 tablet 3 07/16/2019 at Unknown time  . fluticasone (FLONASE) 50 MCG/ACT nasal spray USE 1 SPRAY IN EACH NOSTRIL DAILY (Patient taking differently: 2 sprays. ) 48 g 3 07/16/2019 at Unknown time  . isosorbide mononitrate (IMDUR) 30 MG 24 hr tablet Take 30 mg by mouth daily.  11 07/16/2019 at Unknown time  . montelukast (SINGULAIR) 10 MG tablet Take 1 tablet (10 mg total) by mouth at bedtime. 90 tablet 3 07/15/2019 at Unknown time  . pantoprazole (PROTONIX) 40 MG tablet Take 1 tablet (40 mg  total) by mouth daily. 90 tablet 3 07/16/2019 at Unknown time  . potassium chloride SA (K-DUR,KLOR-CON) 20 MEQ tablet TAKE 1 TABLET DAILY 90 tablet 1 07/15/2019 at Unknown time  . pravastatin (PRAVACHOL) 80 MG tablet Take 80 mg by mouth daily.   07/16/2019 at Unknown time  . spironolactone (ALDACTONE) 25 MG tablet Take 25 mg by mouth daily.   07/16/2019 at Unknown time  . TOPROL XL 100 MG 24 hr tablet TAKE 1 TABLET DAILY WITH OR IMMEDIATELY FOLLOWING A MEAL 90 tablet 3 07/16/2019 at Unknown time  . torsemide (DEMADEX) 20 MG tablet TAKE 3 TABLETS IN THE MORNING AND 2 TABLETS IN THE EVENING 450 tablet 3 07/15/2019 at Unknown time  . ANORO ELLIPTA 62.5-25 MCG/INH AEPB USE 1 INHALATION DAILY (Patient not taking: Reported on 07/16/2019) 60 each 0 Not Taking at Unknown time  . apixaban (ELIQUIS) 5 MG TABS tablet TAKE 1 TABLET TWICE A DAY   07/12/2019  . Cyanocobalamin 1000 MCG SUBL Place 1 tablet (1,000 mcg total) under the tongue every morning. (Patient not taking: Reported on 07/16/2019) 90 tablet 3 Not Taking at Unknown time  . DERMOTIC 0.01 % OIL    Not Taking at Unknown time  . polyethylene glycol-electrolytes (NULYTELY/GOLYTELY) 420 g solution         No Known Allergies   Past Medical History:  Diagnosis  Date  . AICD (automatic cardioverter/defibrillator) present   . Anemia   . Arrhythmia    a fib  . Atrial fibrillation ([place])    on Coumadin  . CAD (coronary artery disease)   . Cancer [facility]) 2001   Prostate, XRT and implant  . CHF (congestive heart failure) ([place])   . COPD (chronic obstructive pulmonary disease) ([place])   . Erectile dysfunction   . GERD (gastroesophageal reflux disease)   . Hyperlipidemia   . Hypertension   . Ischemic cardiomyopathy    s/p AICD/pacer 2009, replaced 2010 [facility]  . MI (myocardial infarction) ([place]) 1988  . Personal history of prostate cancer 2001   s/p XRT and implant ([place])  . Prostate cancer ([place])   . Sleep apnea, obstructive    uses CPAP nightly  .  Thrombocytopenia ([place])   . Tubular adenoma    colon polyp  . Vitamin B 12 deficiency     Review of systems:  Otherwise negative.    Physical Exam  Gen: Alert, oriented. Appears stated age.  HEENT: /AT. PERRLA. Lungs: CTA, no wheezes. CV: RR nl S1, S2. Abd: soft, benign, no masses. BS+ Ext: No edema. Pulses 2+    Planned procedures: Proceed with EGD and  colonoscopy. The patient understands the nature of the planned procedure, indications, risks, alternatives and potential complications including but not limited to bleeding, infection, perforation, damage to internal organs and possible oversedation/side effects from anesthesia. The patient agrees and gives consent to proceed.  Please refer to procedure notes for findings, recommendations and patient disposition/instructions.     Cahterine Heinzel K. Alice Reichert, M.D. Gastroenterology 07/16/2019  1:45 PM

## 2019-07-16 NOTE — Transfer of Care (Signed)
Immediate Anesthesia Transfer of Care Note  Patient: Rick Gilmore  Procedure(s) Performed: ESOPHAGOGASTRODUODENOSCOPY (EGD) WITH PROPOFOL ([name] ) COLONOSCOPY WITH PROPOFOL ([name] )  Patient Location: Endoscopy Unit  Anesthesia Type:General  Level of Consciousness: drowsy and responds to stimulation  Airway & Oxygen Therapy: Patient Spontanous Breathing and Patient connected to face mask oxygen  Post-op Assessment: Report given to RN and Post -op Vital signs reviewed and stable  Post vital signs: Reviewed and stable  Last Vitals:  Vitals Value Taken Time  BP 84/35 07/16/19 1435  Temp 36.4 C 07/16/19 1435  Pulse 65 07/16/19 1437  Resp 22 07/16/19 1437  SpO2 98 % 07/16/19 1437  Vitals shown include unvalidated device data.  Last Pain:  Vitals:   07/16/19 1435  TempSrc:   PainSc: (P) 0-No pain         Complications: No apparent anesthesia complications

## 2019-07-16 NOTE — Interval H&P Note (Signed)
History and Physical Interval Note:  07/16/2019 1:48 PM  Rick Gilmore  has presented today for surgery, with the diagnosis of GERD,HEME +STOOLS,PERSONAL HX.OF COLON POLYPS.  The various methods of treatment have been discussed with the patient and family. After consideration of risks, benefits and other options for treatment, the patient has consented to  Procedure(s): ESOPHAGOGASTRODUODENOSCOPY (EGD) WITH PROPOFOL ([name]) COLONOSCOPY WITH PROPOFOL ([name]) as a surgical intervention.  The patient's history has been reviewed, patient examined, no change in status, stable for surgery.  I have reviewed the patient's chart and labs.  Questions were answered to the patient's satisfaction.     [place], [place]

## 2019-07-16 NOTE — Op Note (Signed)
[facility] Gastroenterology Patient Name: Rick Gilmore Procedure Date: 07/16/2019 1:51 PM MRN: [id number] Account #: [id number] Date of Birth: Aug 26, 1941 Admit Type: Outpatient Age: 78 Room: [facility] ENDO ROOM 1 Gender: Male Note Status: Finalized Procedure:            Upper GI endoscopy Indications:          Gastro-esophageal reflux disease, Portal hypertensive                        gastropathy Providers:            Benay Pike. Gibson Lad MD, MD Medicines:            Propofol per Anesthesia Complications:        No immediate complications. Procedure:            Pre-Anesthesia Assessment:                       - The risks and benefits of the procedure and the                        sedation options and risks were discussed with the                        patient. All questions were answered and informed                        consent was obtained.                       - Patient identification and proposed procedure were                        verified prior to the procedure by the nurse. The                        procedure was verified in the procedure room.                       - ASA Grade Assessment: III - A patient with severe                        systemic disease.                       - After reviewing the risks and benefits, the patient                        was deemed in satisfactory condition to undergo the                        procedure.                       After obtaining informed consent, the endoscope was                        passed under direct vision. Throughout the procedure,                        the patient's blood pressure, pulse, and oxygen  saturations were monitored continuously. The Endoscope                        was introduced through the mouth, and advanced to the                        third part of duodenum. The upper GI endoscopy was                        accomplished without difficulty. The patient  tolerated                        the procedure well. Findings:      The Z-line was irregular and was found 37 to 38 cm from the incisors.       Mucosa was biopsied with a cold forceps for histology. One specimen       bottle was sent to pathology.      There is no endoscopic evidence of esophagitis, stricture or varices in       the entire esophagus.      Moderate portal hypertensive gastropathy was found in the entire       examined stomach. Biopsies were taken with a cold forceps for histology.      There is no endoscopic evidence of varices in the entire examined       stomach.      The examined duodenum was normal.      The exam was otherwise without abnormality. Impression:           - Z-line irregular, 37 to 38 cm from the incisors.                        Biopsied.                       - Portal hypertensive gastropathy. Biopsied.                       - Normal examined duodenum.                       - The examination was otherwise normal. Recommendation:       - Await pathology results.                       - Proceed with colonoscopy Procedure Code(s):    --- Professional ---                       [phone], Esophagogastroduodenoscopy, flexible, transoral;                        with biopsy, single or multiple Diagnosis Code(s):    --- Professional ---                       K21.9, Gastro-esophageal reflux disease without                        esophagitis                       K31.89, Other diseases of stomach and duodenum  K76.6, Portal hypertension                       K22.8, Other specified diseases of esophagus CPT copyright 2019 American Medical Association. All rights reserved. The codes documented in this report are preliminary and upon coder review may  be revised to meet current compliance requirements. Efrain Sella MD, MD 07/16/2019 2:10:34 PM This report has been signed electronically. Number of Addenda: 0 Note Initiated On: 07/16/2019 1:51  PM Estimated Blood Loss: Estimated blood loss: none.      [facility]

## 2019-07-17 ENCOUNTER — Encounter: Payer: Self-pay | Admitting: Internal Medicine

## 2019-07-17 NOTE — Anesthesia Postprocedure Evaluation (Signed)
Anesthesia Post Note  Patient: Rick Gilmore  Procedure(s) Performed: ESOPHAGOGASTRODUODENOSCOPY (EGD) WITH PROPOFOL ([name] ) COLONOSCOPY WITH PROPOFOL ([name] )  Patient location during evaluation: PACU Anesthesia Type: General Level of consciousness: awake and alert Pain management: pain level controlled Vital Signs Assessment: post-procedure vital signs reviewed and stable Respiratory status: spontaneous breathing, nonlabored ventilation, respiratory function stable and patient connected to nasal cannula oxygen Cardiovascular status: blood pressure returned to baseline and stable Postop Assessment: no apparent nausea or vomiting Anesthetic complications: no     Last Vitals:  Vitals:   07/16/19 1449 07/16/19 1455  BP: (!) 90/47 (!) 91/53  Pulse: 60 60  Resp: (!) 28 17  Temp:    SpO2: 94% 95%    Last Pain:  Vitals:   07/16/19 1455  TempSrc:   PainSc: 0-No pain                 Molli Barrows

## 2019-07-18 LAB — SURGICAL PATHOLOGY

## 2019-07-19 ENCOUNTER — Encounter: Payer: Self-pay | Admitting: Internal Medicine

## 2019-07-19 DIAGNOSIS — K766 Portal hypertension: Secondary | ICD-10-CM | POA: Insufficient documentation

## 2019-07-26 DIAGNOSIS — D509 Iron deficiency anemia, unspecified: Secondary | ICD-10-CM | POA: Diagnosis not present

## 2019-07-26 DIAGNOSIS — K219 Gastro-esophageal reflux disease without esophagitis: Secondary | ICD-10-CM | POA: Diagnosis not present

## 2019-07-26 DIAGNOSIS — K766 Portal hypertension: Secondary | ICD-10-CM | POA: Diagnosis not present

## 2019-07-26 DIAGNOSIS — D696 Thrombocytopenia, unspecified: Secondary | ICD-10-CM | POA: Diagnosis not present

## 2019-07-26 DIAGNOSIS — K3189 Other diseases of stomach and duodenum: Secondary | ICD-10-CM | POA: Diagnosis not present

## 2019-07-26 DIAGNOSIS — K7460 Unspecified cirrhosis of liver: Secondary | ICD-10-CM | POA: Diagnosis not present

## 2019-07-26 DIAGNOSIS — R791 Abnormal coagulation profile: Secondary | ICD-10-CM | POA: Diagnosis not present

## 2019-07-26 DIAGNOSIS — Z86010 Personal history of colonic polyps: Secondary | ICD-10-CM | POA: Diagnosis not present

## 2019-08-03 ENCOUNTER — Ambulatory Visit: Payer: Medicare Other

## 2019-08-06 ENCOUNTER — Other Ambulatory Visit: Payer: Self-pay

## 2019-08-06 ENCOUNTER — Encounter: Payer: Self-pay | Admitting: Internal Medicine

## 2019-08-06 ENCOUNTER — Telehealth (INDEPENDENT_AMBULATORY_CARE_PROVIDER_SITE_OTHER): Payer: Medicare Other | Admitting: Internal Medicine

## 2019-08-06 DIAGNOSIS — I5022 Chronic systolic (congestive) heart failure: Secondary | ICD-10-CM | POA: Diagnosis not present

## 2019-08-06 DIAGNOSIS — R7303 Prediabetes: Secondary | ICD-10-CM

## 2019-08-06 DIAGNOSIS — K7469 Other cirrhosis of liver: Secondary | ICD-10-CM

## 2019-08-06 DIAGNOSIS — K766 Portal hypertension: Secondary | ICD-10-CM | POA: Diagnosis not present

## 2019-08-06 NOTE — Progress Notes (Signed)
Virtual Visit via [email or web address]  This visit type was conducted due to national recommendations for restrictions regarding the COVID-19 pandemic (e.g. social distancing).  This format is felt to be most appropriate for this patient at this time.  All issues noted in this document were discussed and addressed.  No physical exam was performed (except for noted visual exam findings with Video Visits).   I connected with@ on 08/06/19 at  4:00 PM EDT by a video enabled telemedicine application or telephone and verified that I am speaking with the correct person using two identifiers. Location patient: home Location provider: work or home office Persons participating in the virtual visit: patient, provider  I discussed the limitations, risks, security and privacy concerns of performing an evaluation and management service by telephone and the availability of in person appointments. I also discussed with the patient that there may be a patient responsible charge related to this service. The patient expressed understanding and agreed to proceed.  Reason for visit: follow up     HPI:  78 yr old male with ischemic cardiomyopathy with systolic dysfunction s/p AICD/pacemaker, OSA on CPAP, COPD, atrial fibrillation on oral anticoagulation, noted to have portal hypertension during recent EGD .  Referred to  GI for portal hypertension noted on EGD done August 10 to investigate heme positive stools.  History of NASH with  cirrhosis diagnosed by [facility] but not seen on ultrasound.  Advised to eliminate all alcohol use and obtain Hep a/B vaccines.   He is taking a  beta blocker   CHF:  Feels generally well,  Walking daily.  Weight is stable,  No orthopnea   IDA resolved,  But continues to use B12 injections for management of pernicious anemia .   hgb 12.8 last week  needs Hep A.B, flu and Shingrx vaccines  Discussed prediabetes and ways to prevent diabetes.   ROS: Patient denies headache, fevers, malaise,  unintentional weight loss, skin rash, eye pain, sinus congestion and sinus pain, sore throat, dysphagia,  hemoptysis , cough, dyspnea, wheezing, chest pain, palpitations, orthopnea, edema, abdominal pain, nausea, melena, diarrhea, constipation, flank pain, dysuria, hematuria, urinary  Frequency, nocturia, numbness, tingling, seizures,  Focal weakness, Loss of consciousness,  Tremor, insomnia, depression, anxiety, and suicidal ideation.     Past Medical History:  Diagnosis Date  . AICD (automatic cardioverter/defibrillator) present   . Anemia   . Arrhythmia    a fib  . Atrial fibrillation ([place])    on Coumadin  . CAD (coronary artery disease)   . Cancer [facility]) 2001   Prostate, XRT and implant  . CHF (congestive heart failure) ([place])   . COPD (chronic obstructive pulmonary disease) ([place])   . Erectile dysfunction   . GERD (gastroesophageal reflux disease)   . Hyperlipidemia   . Hypertension   . Ischemic cardiomyopathy    s/p AICD/pacer 2009, replaced 2010 [facility]  . MI (myocardial infarction) ([place]) 1988  . Personal history of prostate cancer 2001   s/p XRT and implant ([place])  . Prostate cancer ([place])   . Sleep apnea, obstructive    uses CPAP nightly  . Thrombocytopenia ([place])   . Tubular adenoma    colon polyp  . Vitamin B 12 deficiency     Past Surgical History:  Procedure Laterality Date  . CARDIAC CATHETERIZATION    . COLONOSCOPY WITH PROPOFOL [name] 05/30/2015   Procedure: COLONOSCOPY WITH PROPOFOL;  Surgeon: Manya Silvas, MD;  Location[facility] ENDOSCOPY;  Service: Endoscopy;  Laterality: [name];  . COLONOSCOPY  WITH PROPOFOL [name] 07/16/2019   Procedure: COLONOSCOPY WITH PROPOFOL;  Surgeon: Toledo, Benay Pike, MD;  Location: [facility] ENDOSCOPY;  Service: Gastroenterology;  Laterality: [name];  . CORONARY ARTERY BYPASS GRAFT  1988  . ESOPHAGOGASTRODUODENOSCOPY [name] 05/30/2015   Procedure: ESOPHAGOGASTRODUODENOSCOPY (EGD);  Surgeon: Manya Silvas, MD;  Location[facility] ENDOSCOPY;  Service:  Endoscopy;  Laterality: [name];  . ESOPHAGOGASTRODUODENOSCOPY (EGD) WITH PROPOFOL [name] 07/16/2019   Procedure: ESOPHAGOGASTRODUODENOSCOPY (EGD) WITH PROPOFOL;  Surgeon: Toledo, Benay Pike, MD;  Location: [facility] ENDOSCOPY;  Service: Gastroenterology;  Laterality: [name];  . IMPLANTABLE CARDIOVERTER DEFIBRILLATOR GENERATOR CHANGE  April 2014   Bi V RRR  . INSERT / REPLACE / REMOVE PACEMAKER  2014  . VASECTOMY      Family History  Problem Relation Age of Onset  . Hypertension Mother   . Hypertension Father   . Cancer Father        prostate, throat  . Emphysema Father   . Heart disease Maternal Grandmother   . Heart disease Paternal Grandmother     SOCIAL HX:  reports that he has never smoked. He has never used smokeless tobacco. He reports current alcohol use of about 4.0 standard drinks of alcohol per week. He reports that he does not use drugs.   Current Outpatient Medications:  .  albuterol (PROVENTIL HFA;VENTOLIN HFA) 108 (90 Base) MCG/ACT inhaler, Inhale 2 puffs into the lungs every 6 (six) hours as needed for wheezing or shortness of breath., Disp: 1 Inhaler, Rfl: 1 .  allopurinol (ZYLOPRIM) 100 MG tablet, Take 1 tablet (100 mg total) by mouth daily., Disp: 90 tablet, Rfl: 3 .  ANORO ELLIPTA 62.5-25 MCG/INH AEPB, USE 1 INHALATION DAILY, Disp: 60 each, Rfl: 0 .  apixaban (ELIQUIS) 5 MG TABS tablet, TAKE 1 TABLET TWICE A DAY, Disp: , Rfl:  .  colchicine 0.6 MG tablet, Take 0.6 mg by mouth daily., Disp: , Rfl:  .  Cyanocobalamin 1000 MCG SUBL, Place 1 tablet (1,000 mcg total) under the tongue every morning., Disp: 90 tablet, Rfl: 3 .  ENTRESTO 24-26 MG, TAKE 1 TABLET TWICE A DAY, Disp: 180 tablet, Rfl: 3 .  escitalopram (LEXAPRO) 10 MG tablet, Take 1 tablet (10 mg total) by mouth daily., Disp: 90 tablet, Rfl: 3 .  fluticasone (FLONASE) 50 MCG/ACT nasal spray, USE 1 SPRAY IN EACH NOSTRIL DAILY (Patient taking differently: 2 sprays. ), Disp: 48 g, Rfl: 3 .  isosorbide mononitrate (IMDUR) 30 MG 24  hr tablet, Take 30 mg by mouth daily., Disp: , Rfl: 11 .  montelukast (SINGULAIR) 10 MG tablet, Take 1 tablet (10 mg total) by mouth at bedtime., Disp: 90 tablet, Rfl: 3 .  pantoprazole (PROTONIX) 40 MG tablet, Take 1 tablet (40 mg total) by mouth daily., Disp: 90 tablet, Rfl: 3 .  potassium chloride SA (K-DUR,KLOR-CON) 20 MEQ tablet, TAKE 1 TABLET DAILY, Disp: 90 tablet, Rfl: 1 .  pravastatin (PRAVACHOL) 80 MG tablet, Take 80 mg by mouth daily., Disp: , Rfl:  .  spironolactone (ALDACTONE) 25 MG tablet, Take 25 mg by mouth daily., Disp: , Rfl:  .  TOPROL XL 100 MG 24 hr tablet, TAKE 1 TABLET DAILY WITH OR IMMEDIATELY FOLLOWING A MEAL (Patient taking differently: Take 50 mg by mouth daily. ), Disp: 90 tablet, Rfl: 3 .  torsemide (DEMADEX) 20 MG tablet, TAKE 3 TABLETS IN THE MORNING AND 2 TABLETS IN THE EVENING, Disp: 450 tablet, Rfl: 3  EXAM:  VITALS per patient if applicable:  GENERAL: alert, oriented, appears well and  in no acute distress  HEENT: atraumatic, conjunttiva clear, no obvious abnormalities on inspection of external nose and ears  NECK: normal movements of the head and neck  LUNGS: on inspection no signs of respiratory distress, breathing rate appears normal, no obvious gross SOB, gasping or wheezing  CV: no obvious cyanosis  MS: moves all visible extremities without noticeable abnormality  PSYCH/NEURO: pleasant and cooperative, no obvious depression or anxiety, speech and thought processing grossly intact  ASSESSMENT AND PLAN:  Portal hypertension ([place]) Presumed secondary to cirrhosis secondary to NASH  Per GI.  Systemic BP is low and he is tolerating  beta blocker. High risk for catastrophic complications given chronic anticoagulation.  Chronic systolic heart failure Daily weights have been stable, and he has no symptoms of volume overload,   No changes to medications  Prediabetes His random glucose was not elevated but his A1c suggests he is at risk for developing  of diabetes .  I recommend he follow a low glycemic index diet and particpate regularly in an aerobic  exercise activity.  We should check an A1c in 6 months.   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 He is advised to abstain from alcohol     I discussed the assessment and treatment plan with the patient. The patient was provided an opportunity to ask questions and all were answered. The patient agreed with the plan and demonstrated an understanding of the instructions.   The patient was advised to call back or seek an in-person evaluation if the symptoms worsen or if the condition fails to improve as anticipated.  A total of 25 minutes of non face to face time was spent with patient more than half of which was spent in counselling about the above mentioned conditions  and coordination of care   Crecencio Mc, MD

## 2019-08-07 DIAGNOSIS — K7469 Other cirrhosis of liver: Secondary | ICD-10-CM | POA: Insufficient documentation

## 2019-08-07 NOTE — Assessment & Plan Note (Signed)
Daily weights have been stable, and he has no symptoms of volume overload,   No changes to medications

## 2019-08-07 NOTE — Assessment & Plan Note (Signed)
He is advised to schedule vaccination series for Hepatitis A and B. He is advised to abstain from alcohol

## 2019-08-07 NOTE — Assessment & Plan Note (Addendum)
His random glucose was not elevated but his A1c suggests he is at risk for developing of diabetes .  I recommend he follow a low glycemic index diet and particpate regularly in an aerobic  exercise activity.  We should check an A1c in 6 months.

## 2019-08-07 NOTE — Assessment & Plan Note (Addendum)
Presumed secondary to cirrhosis secondary to NASH  Per GI.  Systemic BP is low and he is tolerating  beta blocker. High risk for catastrophic complications given chronic anticoagulation.

## 2019-08-09 ENCOUNTER — Emergency Department: Payer: Medicare Other

## 2019-08-09 ENCOUNTER — Other Ambulatory Visit: Payer: Self-pay

## 2019-08-09 ENCOUNTER — Inpatient Hospital Stay
Admission: EM | Admit: 2019-08-09 | Discharge: 2019-08-14 | DRG: 308 | Disposition: A | Discharge status: Home / Self Care | Payer: Medicare Other | Attending: Internal Medicine | Admitting: Internal Medicine

## 2019-08-09 ENCOUNTER — Encounter: Payer: Self-pay | Admitting: *Deleted

## 2019-08-09 DIAGNOSIS — Z4502 Encounter for adjustment and management of automatic implantable cardiac defibrillator: Secondary | ICD-10-CM | POA: Diagnosis not present

## 2019-08-09 DIAGNOSIS — N170 Acute kidney failure with tubular necrosis: Secondary | ICD-10-CM | POA: Diagnosis not present

## 2019-08-09 DIAGNOSIS — I4892 Unspecified atrial flutter: Secondary | ICD-10-CM | POA: Diagnosis present

## 2019-08-09 DIAGNOSIS — Z7901 Long term (current) use of anticoagulants: Secondary | ICD-10-CM

## 2019-08-09 DIAGNOSIS — J449 Chronic obstructive pulmonary disease, unspecified: Secondary | ICD-10-CM | POA: Diagnosis not present

## 2019-08-09 DIAGNOSIS — E538 Deficiency of other specified B group vitamins: Secondary | ICD-10-CM | POA: Diagnosis present

## 2019-08-09 DIAGNOSIS — I959 Hypotension, unspecified: Secondary | ICD-10-CM | POA: Diagnosis present

## 2019-08-09 DIAGNOSIS — Z8249 Family history of ischemic heart disease and other diseases of the circulatory system: Secondary | ICD-10-CM

## 2019-08-09 DIAGNOSIS — R069 Unspecified abnormalities of breathing: Secondary | ICD-10-CM | POA: Diagnosis not present

## 2019-08-09 DIAGNOSIS — I5022 Chronic systolic (congestive) heart failure: Secondary | ICD-10-CM | POA: Diagnosis not present

## 2019-08-09 DIAGNOSIS — Z951 Presence of aortocoronary bypass graft: Secondary | ICD-10-CM

## 2019-08-09 DIAGNOSIS — Z7951 Long term (current) use of inhaled steroids: Secondary | ICD-10-CM

## 2019-08-09 DIAGNOSIS — Z20828 Contact with and (suspected) exposure to other viral communicable diseases: Secondary | ICD-10-CM | POA: Diagnosis present

## 2019-08-09 DIAGNOSIS — I110 Hypertensive heart disease with heart failure: Secondary | ICD-10-CM | POA: Diagnosis not present

## 2019-08-09 DIAGNOSIS — I472 Ventricular tachycardia, unspecified: Secondary | ICD-10-CM

## 2019-08-09 DIAGNOSIS — K7469 Other cirrhosis of liver: Secondary | ICD-10-CM | POA: Diagnosis present

## 2019-08-09 DIAGNOSIS — Z923 Personal history of irradiation: Secondary | ICD-10-CM

## 2019-08-09 DIAGNOSIS — Z79899 Other long term (current) drug therapy: Secondary | ICD-10-CM

## 2019-08-09 DIAGNOSIS — R531 Weakness: Secondary | ICD-10-CM | POA: Diagnosis not present

## 2019-08-09 DIAGNOSIS — I4510 Unspecified right bundle-branch block: Secondary | ICD-10-CM | POA: Diagnosis not present

## 2019-08-09 DIAGNOSIS — Z95810 Presence of automatic (implantable) cardiac defibrillator: Secondary | ICD-10-CM

## 2019-08-09 DIAGNOSIS — Z8546 Personal history of malignant neoplasm of prostate: Secondary | ICD-10-CM

## 2019-08-09 DIAGNOSIS — I255 Ischemic cardiomyopathy: Secondary | ICD-10-CM | POA: Diagnosis present

## 2019-08-09 DIAGNOSIS — K219 Gastro-esophageal reflux disease without esophagitis: Secondary | ICD-10-CM | POA: Diagnosis present

## 2019-08-09 DIAGNOSIS — G4733 Obstructive sleep apnea (adult) (pediatric): Secondary | ICD-10-CM | POA: Diagnosis present

## 2019-08-09 DIAGNOSIS — I480 Paroxysmal atrial fibrillation: Secondary | ICD-10-CM | POA: Diagnosis present

## 2019-08-09 DIAGNOSIS — I2510 Atherosclerotic heart disease of native coronary artery without angina pectoris: Secondary | ICD-10-CM | POA: Diagnosis present

## 2019-08-09 DIAGNOSIS — Z825 Family history of asthma and other chronic lower respiratory diseases: Secondary | ICD-10-CM

## 2019-08-09 DIAGNOSIS — I499 Cardiac arrhythmia, unspecified: Secondary | ICD-10-CM | POA: Diagnosis not present

## 2019-08-09 DIAGNOSIS — I252 Old myocardial infarction: Secondary | ICD-10-CM

## 2019-08-09 DIAGNOSIS — R0602 Shortness of breath: Secondary | ICD-10-CM | POA: Diagnosis not present

## 2019-08-09 DIAGNOSIS — I4891 Unspecified atrial fibrillation: Secondary | ICD-10-CM | POA: Diagnosis not present

## 2019-08-09 DIAGNOSIS — R0902 Hypoxemia: Secondary | ICD-10-CM | POA: Diagnosis not present

## 2019-08-09 DIAGNOSIS — E785 Hyperlipidemia, unspecified: Secondary | ICD-10-CM | POA: Diagnosis present

## 2019-08-09 LAB — COMPREHENSIVE METABOLIC PANEL
ALT: 32 U/L (ref 0–44)
AST: 112 U/L — ABNORMAL HIGH (ref 15–41)
Albumin: 4.1 g/dL (ref 3.5–5.0)
Alkaline Phosphatase: 66 U/L (ref 38–126)
Anion gap: 13 (ref 5–15)
BUN: 24 mg/dL — ABNORMAL HIGH (ref 8–23)
CO2: 20 mmol/L — ABNORMAL LOW (ref 22–32)
Calcium: 9.4 mg/dL (ref 8.9–10.3)
Chloride: 104 mmol/L (ref 98–111)
Creatinine, Ser: 1.77 mg/dL — ABNORMAL HIGH (ref 0.61–1.24)
GFR calc Af Amer: 42 mL/min — ABNORMAL LOW (ref >60)
GFR calc non Af Amer: 36 mL/min — ABNORMAL LOW (ref >60)
Glucose, Bld: 197 mg/dL — ABNORMAL HIGH (ref 70–99)
Potassium: 4.2 mmol/L (ref 3.5–5.1)
Sodium: 137 mmol/L (ref 135–145)
Total Bilirubin: 1.9 mg/dL — ABNORMAL HIGH (ref 0.3–1.2)
Total Protein: 7.1 g/dL (ref 6.5–8.1)

## 2019-08-09 LAB — CBC WITH DIFFERENTIAL/PLATELET
Abs Immature Granulocytes: 0.06 10*3/uL (ref 0.00–0.07)
Basophils Absolute: 0 10*3/uL (ref 0.0–0.1)
Basophils Relative: 0 %
Eosinophils Absolute: 0 10*3/uL (ref 0.0–0.5)
Eosinophils Relative: 0 %
HCT: 38.4 % — ABNORMAL LOW (ref 39.0–52.0)
Hemoglobin: 12.6 g/dL — ABNORMAL LOW (ref 13.0–17.0)
Immature Granulocytes: 1 %
Lymphocytes Relative: 5 %
Lymphs Abs: 0.7 10*3/uL (ref 0.7–4.0)
MCH: 31.4 pg (ref 26.0–34.0)
MCHC: 32.8 g/dL (ref 30.0–36.0)
MCV: 95.8 fL (ref 80.0–100.0)
Monocytes Absolute: 0.6 10*3/uL (ref 0.1–1.0)
Monocytes Relative: 4 %
Neutro Abs: 11.5 10*3/uL — ABNORMAL HIGH (ref 1.7–7.7)
Neutrophils Relative %: 90 %
Platelets: 121 10*3/uL — ABNORMAL LOW (ref 150–400)
RBC: 4.01 MIL/uL — ABNORMAL LOW (ref 4.22–5.81)
RDW: 14.9 % (ref 11.5–15.5)
WBC: 13.1 10*3/uL — ABNORMAL HIGH (ref 4.0–10.5)
nRBC: 0 % (ref 0.0–0.2)

## 2019-08-09 LAB — TROPONIN I (HIGH SENSITIVITY): Troponin I (High Sensitivity): 11741 ng/L (ref <18)

## 2019-08-09 LAB — SARS CORONAVIRUS 2 BY RT PCR (HOSPITAL ORDER, PERFORMED IN ~~LOC~~ HOSPITAL LAB): SARS Coronavirus 2: NEGATIVE

## 2019-08-09 MED ORDER — SODIUM CHLORIDE 0.9 % IV BOLUS
250.0000 mL | Freq: Once | INTRAVENOUS | Status: AC
  Administered 2019-08-09: 250 mL via INTRAVENOUS

## 2019-08-09 MED ORDER — AMIODARONE HCL IN DEXTROSE 360-4.14 MG/200ML-% IV SOLN: 30.0000 mg/h | INTRAVENOUS | Status: DC

## 2019-08-09 MED ORDER — ASPIRIN 81 MG PO CHEW: 324.0000 mg | CHEWABLE_TABLET | Freq: Once | ORAL | Status: DC

## 2019-08-09 MED ORDER — AMIODARONE HCL IN DEXTROSE 360-4.14 MG/200ML-% IV SOLN
30.0000 mg/h | INTRAVENOUS | Status: DC
  Administered 2019-08-10 – 2019-08-11 (×3): 30 mg/h via INTRAVENOUS
  Filled 2019-08-09 (×3): qty 200

## 2019-08-09 MED ORDER — AMIODARONE HCL IN DEXTROSE 360-4.14 MG/200ML-% IV SOLN
60.0000 mg/h | INTRAVENOUS | Status: DC
  Administered 2019-08-09: 60 mg/h via INTRAVENOUS
  Filled 2019-08-09: qty 200

## 2019-08-09 NOTE — ED Notes (Signed)
No chest pain  Pacemaker interrogated [employer].

## 2019-08-09 NOTE — ED Notes (Signed)
Pt reports while at home he became sob, diaphoretic and nauseated.  Hx chf, mi, cabg.   Pt also has pacemaker/defib.  Pt denies chest pain.  Iv in place.  md in with pt on arrival.  Skin warm and dry

## 2019-08-09 NOTE — ED Notes (Signed)
primed[facility] in with pt for admission now

## 2019-08-09 NOTE — ED Triage Notes (Signed)
Pt brought in via ems from home with sob.  Pt denies chest pain.  Pt alert.  Iv in place.

## 2019-08-09 NOTE — ED Notes (Signed)
Report off to katy rn  

## 2019-08-09 NOTE — ED Notes (Signed)
Ems gave 324 mg asa to patient en route to hospital

## 2019-08-09 NOTE — ED Notes (Signed)
Pt talking on cell phone. Sinus on monitor with occ pvc.

## 2019-08-09 NOTE — ED Provider Notes (Signed)
[facility] Emergency Department Provider Note    First MD Initiated Contact with Patient 08/09/19 2116     (approximate)  I have reviewed the triage vital signs and the nursing notes.   HISTORY  Chief Complaint Shortness of Breath    HPI Rick Gilmore is a 78 y.o. male presents with chief complaint of generalized weakness occurred this evening around 8:00 while he is sitting at home.  Was lying down became very diaphoretic.  EMS found patient initially in rapid A. fib and then had sustained V. tach which self converted.  Does have a pacer defibrillator.  Did not feel that go off.  Denies any chest pain right now.  Does feel fatigued.    Past Medical History:  Diagnosis Date  . AICD (automatic cardioverter/defibrillator) present   . Anemia   . Arrhythmia    a fib  . Atrial fibrillation ([place])    on Coumadin  . CAD (coronary artery disease)   . Cancer [facility]) 2001   Prostate, XRT and implant  . CHF (congestive heart failure) ([place])   . COPD (chronic obstructive pulmonary disease) ([place])   . Erectile dysfunction   . GERD (gastroesophageal reflux disease)   . Hyperlipidemia   . Hypertension   . Ischemic cardiomyopathy    s/p AICD/pacer 2009, replaced 2010 [facility]  . MI (myocardial infarction) ([place]) 1988  . Personal history of prostate cancer 2001   s/p XRT and implant ([place])  . Prostate cancer ([place])   . Sleep apnea, obstructive    uses CPAP nightly  . Thrombocytopenia ([place])   . Tubular adenoma    colon polyp  . Vitamin B 12 deficiency    Family History  Problem Relation Age of Onset  . Hypertension Mother   . Hypertension Father   . Cancer Father        prostate, throat  . Emphysema Father   . Heart disease Maternal Grandmother   . Heart disease Paternal Grandmother    Past Surgical History:  Procedure Laterality Date  . CARDIAC CATHETERIZATION    . COLONOSCOPY WITH PROPOFOL [name] 05/30/2015   Procedure: COLONOSCOPY WITH PROPOFOL;  Surgeon:  Manya Silvas, MD;  Location: [facility] ENDOSCOPY;  Service: Endoscopy;  Laterality: [name];  . COLONOSCOPY WITH PROPOFOL [name] 07/16/2019   Procedure: COLONOSCOPY WITH PROPOFOL;  Surgeon: Toledo, Benay Pike, MD;  Location: [facility] ENDOSCOPY;  Service: Gastroenterology;  Laterality: [name];  . CORONARY ARTERY BYPASS GRAFT  1988  . ESOPHAGOGASTRODUODENOSCOPY [name] 05/30/2015   Procedure: ESOPHAGOGASTRODUODENOSCOPY (EGD);  Surgeon: Manya Silvas, MD;  Location: [facility] ENDOSCOPY;  Service: Endoscopy;  Laterality: [name];  . ESOPHAGOGASTRODUODENOSCOPY (EGD) WITH PROPOFOL [name] 07/16/2019   Procedure: ESOPHAGOGASTRODUODENOSCOPY (EGD) WITH PROPOFOL;  Surgeon: Toledo, Benay Pike, MD;  Location: [facility] ENDOSCOPY;  Service: Gastroenterology;  Laterality: [name];  . IMPLANTABLE CARDIOVERTER DEFIBRILLATOR GENERATOR CHANGE  April 2014   Bi V RRR  . INSERT / REPLACE / REMOVE PACEMAKER  2014  . VASECTOMY     Patient Active Problem List   Diagnosis Date Noted  . V tach ([place]) 08/09/2019  . Defibrillator discharge 08/09/2019  . 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 Portal hypertension ([place]) 07/19/2019  . 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 Obesity 04/20/2016  . Hearing loss of left ear due to cerumen impaction 04/19/2016  . Hypotension 03/10/2016  . Allergic rhinitis 02/02/2016  . Chronic renal insufficiency 01/16/2016  . Prediabetes 01/16/2016  . 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 S/P implantation of  automatic cardioverter/defibrillator (AICD) 10/21/2015  . Diverticulosis of colon without hemorrhage 10/21/2015  . Internal hemorrhoids 10/21/2015  . Atrial fibrillation ([place]) 10/15/2015  . Tubular adenoma of colon 06/04/2015  . COPD (chronic obstructive pulmonary disease) ([place]) 04/20/2015  . Iron deficiency anemia 04/13/2015  . Thrombocytopenia ([place]) 12/01/2014  . Hyperlipidemia LDL goal <70 12/01/2014  . Chronic systolic heart failure ([place]) 10/08/2014  . Long term current use of anticoagulant  therapy 11/02/2011  . Pernicious anemia 11/02/2011  . Ischemic cardiomyopathy   . Sleep apnea, obstructive       Prior to Admission medications   Medication Sig Start Date End Date Taking? Authorizing Provider  albuterol (PROVENTIL HFA;VENTOLIN HFA) 108 (90 Base) MCG/ACT inhaler Inhale 2 puffs into the lungs every 6 (six) hours as needed for wheezing or shortness of breath. 12/26/18  Yes Guse, Jacquelynn Cree, FNP  allopurinol (ZYLOPRIM) 100 MG tablet Take 1 tablet (100 mg total) by mouth daily. 07/06/19  Yes Crecencio Mc, MD  ANORO ELLIPTA 62.5-25 MCG/INH AEPB USE 1 INHALATION DAILY Patient taking differently: Inhale 1 puff into the lungs daily.  06/12/19  Yes Wilhelmina Mcardle, MD  apixaban (ELIQUIS) 5 MG TABS tablet Take 5 mg by mouth 2 (two) times daily.  06/12/19  Yes [provider]  colchicine 0.6 MG tablet Take 0.6 mg by mouth daily.   Yes [provider]  Cyanocobalamin 1000 MCG SUBL Place 1 tablet (1,000 mcg total) under the tongue every morning. 06/15/17  Yes Crecencio Mc, MD  ENTRESTO 24-26 MG TAKE 1 TABLET TWICE A DAY Patient taking differently: Take 1 tablet by mouth 2 (two) times daily.  06/12/19  Yes Crecencio Mc, MD  escitalopram (LEXAPRO) 10 MG tablet Take 1 tablet (10 mg total) by mouth daily. 07/06/19  Yes Crecencio Mc, MD  fluticasone (FLONASE) 50 MCG/ACT nasal spray USE 1 SPRAY IN EACH NOSTRIL DAILY Patient taking differently: Place 2 sprays into both nostrils daily.  05/15/18  Yes Wilhelmina Mcardle, MD  isosorbide mononitrate (IMDUR) 30 MG 24 hr tablet Take 30 mg by mouth daily.   Yes [provider]  montelukast (SINGULAIR) 10 MG tablet Take 1 tablet (10 mg total) by mouth at bedtime. 07/06/19  Yes Crecencio Mc, MD  pantoprazole (PROTONIX) 40 MG tablet Take 1 tablet (40 mg total) by mouth daily. 05/10/19  Yes Crecencio Mc, MD  potassium chloride SA (K-DUR,KLOR-CON) 20 MEQ tablet TAKE 1 TABLET DAILY Patient taking differently: Take 20 mEq by  mouth daily.  01/15/19  Yes Crecencio Mc, MD  pravastatin (PRAVACHOL) 80 MG tablet Take 80 mg by mouth daily.   Yes [provider]  spironolactone (ALDACTONE) 25 MG tablet Take 25 mg by mouth daily.   Yes [provider]  TOPROL XL 100 MG 24 hr tablet TAKE 1 TABLET DAILY WITH OR IMMEDIATELY FOLLOWING A MEAL Patient taking differently: Take 50 mg by mouth daily.  06/29/18  Yes Hackney, Otila Kluver A, FNP  torsemide (DEMADEX) 20 MG tablet TAKE 3 TABLETS IN THE MORNING AND 2 TABLETS IN THE EVENING Patient taking differently: Take 40-60 mg by mouth 2 (two) times daily. 3 tablets (60mg ) in the morning and 2 tablets (40mg ) in the evening 06/29/18  Yes Alisa Graff, FNP    Allergies Patient has no known allergies.    Social History Social History   Tobacco Use  . Smoking status: Never Smoker  . Smokeless tobacco: Never Used  Substance Use Topics  . Alcohol  use: Yes    Alcohol/week: 4.0 standard drinks    Types: 2 Standard drinks or equivalent, 2 Cans of beer per week    Comment: Mixed drinks  . Drug use: No    Review of Systems Patient denies headaches, rhinorrhea, blurry vision, numbness, shortness of breath, chest pain, edema, cough, abdominal pain, nausea, vomiting, diarrhea, dysuria, fevers, rashes or hallucinations unless otherwise stated above in HPI. ____________________________________________   PHYSICAL EXAM:  VITAL SIGNS: Vitals:   08/09/19 2221 08/09/19 2240  BP: 100/82 (!) 83/53  Pulse: 62   Resp: (!) 21 (!) 26  Temp:    SpO2: 97%     Constitutional: Alert and oriented.  Eyes: Conjunctivae are normal.  Head: Atraumatic. Nose: No congestion/rhinnorhea. Mouth/Throat: Mucous membranes are moist.   Neck: No stridor. Painless ROM.  Cardiovascular: Normal rate, regular rhythm. Grossly normal heart sounds.  Good peripheral circulation. Respiratory: Normal respiratory effort.  No retractions. Lungs CTAB. Gastrointestinal: Soft and nontender. No  distention. No abdominal bruits. No CVA tenderness. Genitourinary:  Musculoskeletal: No lower extremity tenderness nor edema.  No joint effusions. Neurologic:  Normal speech and language. No gross focal neurologic deficits are appreciated. No facial droop Skin:  Skin is warm, dry and intact. No rash noted. Psychiatric: Mood and affect are normal. Speech and behavior are normal.  ____________________________________________   LABS (all labs ordered are listed, but only abnormal results are displayed)  Results for orders placed or performed during the hospital encounter of 08/09/19 (from the past 24 hour(s))  CBC with Differential/Platelet     Status: Abnormal   Collection Time: 08/09/19  9:13 PM  Result Value Ref Range   WBC 13.1 (H) 4.0 - 10.5 K/uL   RBC 4.01 (L) 4.22 - 5.81 MIL/uL   Hemoglobin 12.6 (L) 13.0 - 17.0 g/dL   HCT 38.4 (L) 39.0 - 52.0 %   MCV 95.8 80.0 - 100.0 fL   MCH 31.4 26.0 - 34.0 pg   MCHC 32.8 30.0 - 36.0 g/dL   RDW 14.9 11.5 - 15.5 %   Platelets 121 (L) 150 - 400 K/uL   nRBC 0.0 0.0 - 0.2 %   Neutrophils Relative % 90 %   Neutro Abs 11.5 (H) 1.7 - 7.7 K/uL   Lymphocytes Relative 5 %   Lymphs Abs 0.7 0.7 - 4.0 K/uL   Monocytes Relative 4 %   Monocytes Absolute 0.6 0.1 - 1.0 K/uL   Eosinophils Relative 0 %   Eosinophils Absolute 0.0 0.0 - 0.5 K/uL   Basophils Relative 0 %   Basophils Absolute 0.0 0.0 - 0.1 K/uL   Immature Granulocytes 1 %   Abs Immature Granulocytes 0.06 0.00 - 0.07 K/uL  Comprehensive metabolic panel     Status: Abnormal   Collection Time: 08/09/19  9:13 PM  Result Value Ref Range   Sodium 137 135 - 145 mmol/L   Potassium 4.2 3.5 - 5.1 mmol/L   Chloride 104 98 - 111 mmol/L   CO2 20 (L) 22 - 32 mmol/L   Glucose, Bld 197 (H) 70 - 99 mg/dL   BUN 24 (H) 8 - 23 mg/dL   Creatinine, Ser 1.77 (H) 0.61 - 1.24 mg/dL   Calcium 9.4 8.9 - 10.3 mg/dL   Total Protein 7.1 6.5 - 8.1 g/dL   Albumin 4.1 3.5 - 5.0 g/dL   AST 112 (H) 15 - 41 U/L    ALT 32 0 - 44 U/L   Alkaline Phosphatase 66 38 - 126 U/L  Total Bilirubin 1.9 (H) 0.3 - 1.2 mg/dL   GFR calc non Af Amer 36 (L) >60 mL/min   GFR calc Af Amer 42 (L) >60 mL/min   Anion gap 13 5 - 15  Troponin I (High Sensitivity)     Status: Abnormal   Collection Time: 08/09/19  9:13 PM  Result Value Ref Range   Troponin I (High Sensitivity) 11,741 (HH) <18 ng/L  SARS Coronavirus 2 [facility] order, Performed in [facility] hospital lab) Nasopharyngeal Nasopharyngeal Swab     Status: None   Collection Time: 08/09/19  9:13 PM   Specimen: Nasopharyngeal Swab  Result Value Ref Range   SARS Coronavirus 2 NEGATIVE NEGATIVE   ____________________________________________  EKG My review and personal interpretation at Time: 21:13   Indication: weakness  Rate: 80  Rhythm: sinus Axis: normal Other: elevated in aVR with diffuse st depression concerning for global ischemia ____________________________________________  RADIOLOGY  I personally reviewed all radiographic images ordered to evaluate for the above acute complaints and reviewed radiology reports and findings.  These findings were personally discussed with the patient.  Please see medical record for radiology report.  ____________________________________________   PROCEDURES  Procedure(s) performed:  .Critical Care Performed by: Merlyn Lot, MD Authorized by: Merlyn Lot, MD   Critical care provider statement:    Critical care time (minutes):  30   Critical care time was exclusive of:  Separately billable procedures and treating other patients   Critical care was necessary to treat or prevent imminent or life-threatening deterioration of the following conditions:  Cardiac failure   Critical care was time spent personally by me on the following activities:  Development of treatment plan with patient or surrogate, discussions with consultants, evaluation of patient's response to treatment, examination of patient,  obtaining history from patient or surrogate, ordering and performing treatments and interventions, ordering and review of laboratory studies, ordering and review of radiographic studies, pulse oximetry, re-evaluation of patient's condition and review of old charts      Critical Care performed: yes ____________________________________________   INITIAL IMPRESSION / [place] / ED COURSE  Pertinent labs & imaging results that were available during my care of the patient were reviewed by me and considered in my medical decision making (see chart for details).   DDX: ACS, pericarditis, esophagitis, boerhaaves, pe, dissection, pna, bronchitis, costochondritis   Rick Gilmore is a 78 y.o. who presents to the ED with presentation as described above.  He is currently pain-free but with extensive and complex past medical history.  EMS strips do show evidence of sustained V. tach which appear to have self converted.  Will interrogate pacer.  EKG my evaluation does show diffuse ST depressions but is currently pain-free.  Likely global ischemia in the setting of sustained tachycardia with his known congestive heart failure.  He is already on Eliquis.  Clinical Course as of Aug 08 2321  Thu Aug 09, 2019  2221 Patient with profoundly elevated troponin likely secondary to demand ischemia in the setting of his multivessel disease and congestive heart failure with sustained V. tach greater than 4 hours.  He is currently pain-free.  No hypoxia.  Electrolytes are normal.  Discussed case in consultation with Dr. Clayborn Bigness of cardiology who agrees with initiating amiodarone infusion and admission.    [PR]    Clinical Course User Index [PR] Merlyn Lot, MD    The patient was evaluated in Emergency Department today for the symptoms described in the history of present illness. He/she was  evaluated in the context of the global COVID-19 pandemic, which necessitated consideration that the patient  might be at risk for infection with the SARS-CoV-2 virus that causes COVID-19. Institutional protocols and algorithms that pertain to the evaluation of patients at risk for COVID-19 are in a state of rapid change based on information released by regulatory bodies including the CDC and federal and state organizations. These policies and algorithms were followed during the patient's care in the ED.  As part of my medical decision making, I reviewed the following data within the [place] notes reviewed and incorporated, Labs reviewed, notes from prior ED visits and [place] Controlled Substance Database   ____________________________________________   FINAL CLINICAL IMPRESSION(S) / ED DIAGNOSES  Final diagnoses:  Ventricular tachycardia ([place])      NEW MEDICATIONS STARTED DURING THIS VISIT:  New Prescriptions   No medications on file     Note:  This document was prepared using Dragon voice recognition software and may include unintentional dictation errors.    Merlyn Lot, MD 08/09/19 2322

## 2019-08-09 NOTE — H&P (Signed)
[facility] NAME: Rick Gilmore    MR#:  [id number]  DATE OF BIRTH:  [birth year]  DATE OF ADMISSION:  08/09/2019  PRIMARY CARE PHYSICIAN: Crecencio Mc, MD   REQUESTING/REFERRING PHYSICIAN: Quentin Cornwall, MD  CHIEF COMPLAINT:   Chief Complaint  Patient presents with  . Shortness of Breath    HISTORY OF PRESENT ILLNESS:  Rick Gilmore  is a 78 y.o. male who presents with chief complaint as above.  Patient presents the ED with a complaint of shortness of breath.  He states that he has been having some malaise as well.  He initially called EMS due to this malaise with an episode of diaphoresis.  EMS originally found him to be in A. fib and then sustained V. tach.  His rhythm then converted.  He does have a pacer defibrillator, but he states he did not feel like to go off.  However, it was interrogated here in the ED and does show 2 discharges.  Patient was placed on amiodarone drip, hospitalist were called for admission  PAST MEDICAL HISTORY:   Past Medical History:  Diagnosis Date  . AICD (automatic cardioverter/defibrillator) present   . Anemia   . Arrhythmia    a fib  . Atrial fibrillation ([place])    on Coumadin  . CAD (coronary artery disease)   . Cancer [facility]) 2001   Prostate, XRT and implant  . CHF (congestive heart failure) ([place])   . COPD (chronic obstructive pulmonary disease) ([place])   . Erectile dysfunction   . GERD (gastroesophageal reflux disease)   . Hyperlipidemia   . Hypertension   . Ischemic cardiomyopathy    s/p AICD/pacer 2009, replaced 2010 [facility]  . MI (myocardial infarction) ([place]) 1988  . Personal history of prostate cancer 2001   s/p XRT and implant ([place])  . Prostate cancer ([place])   . Sleep apnea, obstructive    uses CPAP nightly  . Thrombocytopenia ([place])   . Tubular adenoma    colon polyp  . Vitamin B 12 deficiency      PAST SURGICAL HISTORY:   Past Surgical History:  Procedure Laterality  Date  . CARDIAC CATHETERIZATION    . COLONOSCOPY WITH PROPOFOL [name] 05/30/2015   Procedure: COLONOSCOPY WITH PROPOFOL;  Surgeon: Manya Silvas, MD;  Location: [facility] ENDOSCOPY;  Service: Endoscopy;  Laterality: [name];  . COLONOSCOPY WITH PROPOFOL [name] 07/16/2019   Procedure: COLONOSCOPY WITH PROPOFOL;  Surgeon: Toledo, Benay Pike, MD;  Location: [facility] ENDOSCOPY;  Service: Gastroenterology;  Laterality: [name];  . CORONARY ARTERY BYPASS GRAFT  1988  . ESOPHAGOGASTRODUODENOSCOPY [name] 05/30/2015   Procedure: ESOPHAGOGASTRODUODENOSCOPY (EGD);  Surgeon: Manya Silvas, MD;  Location: [facility] ENDOSCOPY;  Service: Endoscopy;  Laterality: [name];  . ESOPHAGOGASTRODUODENOSCOPY (EGD) WITH PROPOFOL [name] 07/16/2019   Procedure: ESOPHAGOGASTRODUODENOSCOPY (EGD) WITH PROPOFOL;  Surgeon: Toledo, Benay Pike, MD;  Location: [facility] ENDOSCOPY;  Service: Gastroenterology;  Laterality: [name];  . IMPLANTABLE CARDIOVERTER DEFIBRILLATOR GENERATOR CHANGE  April 2014   Bi V RRR  . INSERT / REPLACE / REMOVE PACEMAKER  2014  . VASECTOMY       SOCIAL HISTORY:   Social History   Tobacco Use  . Smoking status: Never Smoker  . Smokeless tobacco: Never Used  Substance Use Topics  . Alcohol use: Yes    Alcohol/week: 4.0 standard drinks    Types: 2 Standard drinks or equivalent, 2 Cans of beer per week    Comment: Mixed drinks  FAMILY HISTORY:   Family History  Problem Relation Age of Onset  . Hypertension Mother   . Hypertension Father   . Cancer Father        prostate, throat  . Emphysema Father   . Heart disease Maternal Grandmother   . Heart disease Paternal Grandmother      DRUG ALLERGIES:  No Known Allergies  MEDICATIONS AT HOME:   Prior to Admission medications   Medication Sig Start Date End Date Taking? Authorizing Provider  albuterol (PROVENTIL HFA;VENTOLIN HFA) 108 (90 Base) MCG/ACT inhaler Inhale 2 puffs into the lungs every 6 (six) hours as needed for wheezing or shortness of breath. 12/26/18  Yes Guse,  Jacquelynn Cree, FNP  allopurinol (ZYLOPRIM) 100 MG tablet Take 1 tablet (100 mg total) by mouth daily. 07/06/19  Yes Crecencio Mc, MD  ANORO ELLIPTA 62.5-25 MCG/INH AEPB USE 1 INHALATION DAILY Patient taking differently: Inhale 1 puff into the lungs daily.  06/12/19  Yes Wilhelmina Mcardle, MD  apixaban (ELIQUIS) 5 MG TABS tablet Take 5 mg by mouth 2 (two) times daily.  06/12/19  Yes [provider]  colchicine 0.6 MG tablet Take 0.6 mg by mouth daily.   Yes [provider]  Cyanocobalamin 1000 MCG SUBL Place 1 tablet (1,000 mcg total) under the tongue every morning. 06/15/17  Yes Crecencio Mc, MD  ENTRESTO 24-26 MG TAKE 1 TABLET TWICE A DAY Patient taking differently: Take 1 tablet by mouth 2 (two) times daily.  06/12/19  Yes Crecencio Mc, MD  escitalopram (LEXAPRO) 10 MG tablet Take 1 tablet (10 mg total) by mouth daily. 07/06/19  Yes Crecencio Mc, MD  fluticasone (FLONASE) 50 MCG/ACT nasal spray USE 1 SPRAY IN EACH NOSTRIL DAILY Patient taking differently: Place 2 sprays into both nostrils daily.  05/15/18  Yes Wilhelmina Mcardle, MD  isosorbide mononitrate (IMDUR) 30 MG 24 hr tablet Take 30 mg by mouth daily.   Yes [provider]  montelukast (SINGULAIR) 10 MG tablet Take 1 tablet (10 mg total) by mouth at bedtime. 07/06/19  Yes Crecencio Mc, MD  pantoprazole (PROTONIX) 40 MG tablet Take 1 tablet (40 mg total) by mouth daily. 05/10/19  Yes Crecencio Mc, MD  potassium chloride SA (K-DUR,KLOR-CON) 20 MEQ tablet TAKE 1 TABLET DAILY Patient taking differently: Take 20 mEq by mouth daily.  01/15/19  Yes Crecencio Mc, MD  pravastatin (PRAVACHOL) 80 MG tablet Take 80 mg by mouth daily.   Yes [provider]  spironolactone (ALDACTONE) 25 MG tablet Take 25 mg by mouth daily.   Yes [provider]  TOPROL XL 100 MG 24 hr tablet TAKE 1 TABLET DAILY WITH OR IMMEDIATELY FOLLOWING A MEAL Patient taking differently: Take 50 mg by mouth daily.  06/29/18  Yes  Hackney, Otila Kluver A, FNP  torsemide (DEMADEX) 20 MG tablet TAKE 3 TABLETS IN THE MORNING AND 2 TABLETS IN THE EVENING Patient taking differently: Take 40-60 mg by mouth 2 (two) times daily. 3 tablets (60mg ) in the morning and 2 tablets (40mg ) in the evening 06/29/18  Yes Alisa Graff, FNP    REVIEW OF SYSTEMS:  Review of Systems  Constitutional: Positive for malaise/fatigue. Negative for chills, fever and weight loss.  HENT: Negative for ear pain, hearing loss and tinnitus.   Eyes: Negative for blurred vision, double vision, pain and redness.  Respiratory: Positive for shortness of breath. Negative for cough and hemoptysis.   Cardiovascular: Negative for chest pain, palpitations, orthopnea  and leg swelling.  Gastrointestinal: Negative for abdominal pain, constipation, diarrhea, nausea and vomiting.  Genitourinary: Negative for dysuria, frequency and hematuria.  Musculoskeletal: Negative for back pain, joint pain and neck pain.  Skin:       No acne, rash, or lesions  Neurological: Negative for dizziness, tremors, focal weakness and weakness.  Endo/Heme/Allergies: Negative for polydipsia. Does not bruise/bleed easily.  Psychiatric/Behavioral: Negative for depression. The patient is not nervous/anxious and does not have insomnia.      VITAL SIGNS:   Vitals:   08/09/19 2140 08/09/19 2200 08/09/19 2221 08/09/19 2240  BP: (!) 88/57 (!) 95/58 100/82 (!) 83/53  Pulse: 66 69 62   Resp: (!) 22 (!) 24 (!) 21 (!) 26  Temp:      TempSrc:      SpO2: 94% 94% 97%    Wt Readings from Last 3 Encounters:  08/06/19 120.2 kg  07/16/19 120.2 kg  01/18/19 121.7 kg    PHYSICAL EXAMINATION:  Physical Exam  Vitals reviewed. Constitutional: He is oriented to person, place, and time. He appears well-developed and well-nourished. No distress.  HENT:  Head: Normocephalic and atraumatic.  Mouth/Throat: Oropharynx is clear and moist.  Eyes: Pupils are equal, round, and reactive to light. Conjunctivae and  EOM are normal. No scleral icterus.  Neck: Normal range of motion. Neck supple. No JVD present. No thyromegaly present.  Cardiovascular: Intact distal pulses. Exam reveals no gallop and no friction rub.  No murmur heard. Intermittent tachycardia, irregular rhythm  Respiratory: Effort normal and breath sounds normal. No respiratory distress. He has no wheezes. He has no rales.  GI: Soft. Bowel sounds are normal. He exhibits no distension. There is no abdominal tenderness.  Musculoskeletal: Normal range of motion.        General: No edema.     Comments: No arthritis, no gout  Lymphadenopathy:    He has no cervical adenopathy.  Neurological: He is alert and oriented to person, place, and time. No cranial nerve deficit.  No dysarthria, no aphasia  Skin: Skin is warm and dry. No rash noted. No erythema.  Psychiatric: He has a normal mood and affect. His behavior is normal. Judgment and thought content normal.    LABORATORY PANEL:   CBC Recent Labs  Lab 08/09/19 2113  WBC 13.1*  HGB 12.6*  HCT 38.4*  PLT 121*   ------------------------------------------------------------------------------------------------------------------  Chemistries  Recent Labs  Lab 08/09/19 2113  NA 137  K 4.2  CL 104  CO2 20*  GLUCOSE 197*  BUN 24*  CREATININE 1.77*  CALCIUM 9.4  AST 112*  ALT 32  ALKPHOS 66  BILITOT 1.9*   ------------------------------------------------------------------------------------------------------------------  Cardiac Enzymes No results for input(s): TROPONINI in the last 168 hours. ------------------------------------------------------------------------------------------------------------------  RADIOLOGY:  Dg Chest Portable 1 View  Result Date: 08/09/2019 CLINICAL DATA:  Acute onset of shortness of breath, diaphoresis and nausea while at home earlier today. Current history of CHF. Prior CABG. EXAM: PORTABLE CHEST 1 VIEW COMPARISON:  12/26/2018 and earlier,  including CT chest 10/07/2014. FINDINGS: Prior sternotomy for CABG. LEFT subclavian biventricular pacing defibrillator, unchanged. Cardiac silhouette moderately to markedly enlarged, unchanged. Pulmonary venous hypertension and minimal to mild interstitial pulmonary edema. Small RIGHT pleural effusion. No confluent airspace consolidation. IMPRESSION: Minimal to mild CHF, with stable moderate to marked cardiomegaly and minimal to mild diffuse interstitial pulmonary edema. Small RIGHT pleural effusion. Electronically Signed   By: Evangeline Dakin M.D.   On: 08/09/2019 21:51    EKG:   Orders  placed or performed during the hospital encounter of 08/09/19  . EKG 12-Lead  . EKG 12-Lead    IMPRESSION AND PLAN:  Principal Problem:   V tach ([place]) -patient had 2 defibrillator discharges according to his pacer defibrillator interrogation.  Patient did not feel these discharges.  He is currently in A. fib on an amiodarone drip.  His troponin is significantly elevated at around 11,000.  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 Labs, imaging and/or ECG reviewed by provider and discussed with patient/family. Management plans discussed with the patient and/or family.  COVID-19 status: Tested negative     DVT PROPHYLAXIS: Systemic anticoagulation  GI PROPHYLAXIS:  None  ADMISSION STATUS: Observation  CODE STATUS: Full Code Status History    Date Active Date Inactive Code Status Order ID Comments User Context   01/16/2016 1544 01/19/2016 1726 Full Code [id number]  Theodoro Grist, MD Inpatient   Advance Care  Planning Activity    Advance Directive Documentation     Most Recent Value  Type of Advance Directive  Living will  Pre-existing out of facility DNR order (yellow form or pink MOST form)  -  "MOST" Form in Place?  -      TOTAL TIME TAKING CARE OF THIS PATIENT: 40 minutes.   This patient was evaluated in the context of the global COVID-19 pandemic, which necessitated consideration that the patient might be at risk for infection with the SARS-CoV-2 virus that causes COVID-19. Institutional protocols and algorithms that pertain to the evaluation of patients at risk for COVID-19 are in a state of rapid change based on information released by regulatory bodies including the CDC and federal and state organizations. These policies and algorithms were followed to the best of this provider's knowledge to date during the patient's care at this facility.  Ethlyn Daniels 08/09/2019, 11:26 PM  [facility]  Office  [phone]  CC: Primary care physician; Crecencio Mc, MD  Note:  This document was prepared using Dragon voice recognition software and may include unintentional dictation errors.

## 2019-08-09 NOTE — ED Notes (Signed)
Pt alert and talking  No chest pain or sob.  Iv meds infusing  Pt waiting on admission.

## 2019-08-10 ENCOUNTER — Other Ambulatory Visit: Payer: Self-pay

## 2019-08-10 DIAGNOSIS — I959 Hypotension, unspecified: Secondary | ICD-10-CM | POA: Diagnosis present

## 2019-08-10 DIAGNOSIS — I5022 Chronic systolic (congestive) heart failure: Secondary | ICD-10-CM | POA: Diagnosis not present

## 2019-08-10 DIAGNOSIS — N170 Acute kidney failure with tubular necrosis: Secondary | ICD-10-CM | POA: Diagnosis present

## 2019-08-10 DIAGNOSIS — I472 Ventricular tachycardia: Secondary | ICD-10-CM | POA: Diagnosis not present

## 2019-08-10 DIAGNOSIS — I255 Ischemic cardiomyopathy: Secondary | ICD-10-CM | POA: Diagnosis present

## 2019-08-10 DIAGNOSIS — E538 Deficiency of other specified B group vitamins: Secondary | ICD-10-CM | POA: Diagnosis present

## 2019-08-10 DIAGNOSIS — Z20828 Contact with and (suspected) exposure to other viral communicable diseases: Secondary | ICD-10-CM | POA: Diagnosis present

## 2019-08-10 DIAGNOSIS — I4901 Ventricular fibrillation: Secondary | ICD-10-CM | POA: Diagnosis not present

## 2019-08-10 DIAGNOSIS — K7469 Other cirrhosis of liver: Secondary | ICD-10-CM | POA: Diagnosis present

## 2019-08-10 DIAGNOSIS — Z4502 Encounter for adjustment and management of automatic implantable cardiac defibrillator: Secondary | ICD-10-CM | POA: Diagnosis not present

## 2019-08-10 DIAGNOSIS — I252 Old myocardial infarction: Secondary | ICD-10-CM | POA: Diagnosis not present

## 2019-08-10 DIAGNOSIS — E785 Hyperlipidemia, unspecified: Secondary | ICD-10-CM | POA: Diagnosis present

## 2019-08-10 DIAGNOSIS — I2510 Atherosclerotic heart disease of native coronary artery without angina pectoris: Secondary | ICD-10-CM | POA: Diagnosis present

## 2019-08-10 DIAGNOSIS — I480 Paroxysmal atrial fibrillation: Secondary | ICD-10-CM | POA: Diagnosis present

## 2019-08-10 DIAGNOSIS — Z951 Presence of aortocoronary bypass graft: Secondary | ICD-10-CM | POA: Diagnosis not present

## 2019-08-10 DIAGNOSIS — J449 Chronic obstructive pulmonary disease, unspecified: Secondary | ICD-10-CM | POA: Diagnosis not present

## 2019-08-10 DIAGNOSIS — I110 Hypertensive heart disease with heart failure: Secondary | ICD-10-CM | POA: Diagnosis present

## 2019-08-10 DIAGNOSIS — K219 Gastro-esophageal reflux disease without esophagitis: Secondary | ICD-10-CM | POA: Diagnosis present

## 2019-08-10 DIAGNOSIS — G4733 Obstructive sleep apnea (adult) (pediatric): Secondary | ICD-10-CM | POA: Diagnosis present

## 2019-08-10 DIAGNOSIS — Z7901 Long term (current) use of anticoagulants: Secondary | ICD-10-CM | POA: Diagnosis not present

## 2019-08-10 DIAGNOSIS — Z8249 Family history of ischemic heart disease and other diseases of the circulatory system: Secondary | ICD-10-CM | POA: Diagnosis not present

## 2019-08-10 DIAGNOSIS — R0902 Hypoxemia: Secondary | ICD-10-CM | POA: Diagnosis not present

## 2019-08-10 DIAGNOSIS — Z825 Family history of asthma and other chronic lower respiratory diseases: Secondary | ICD-10-CM | POA: Diagnosis not present

## 2019-08-10 DIAGNOSIS — I4892 Unspecified atrial flutter: Secondary | ICD-10-CM | POA: Diagnosis present

## 2019-08-10 DIAGNOSIS — Z923 Personal history of irradiation: Secondary | ICD-10-CM | POA: Diagnosis not present

## 2019-08-10 DIAGNOSIS — Z95810 Presence of automatic (implantable) cardiac defibrillator: Secondary | ICD-10-CM | POA: Diagnosis not present

## 2019-08-10 LAB — BASIC METABOLIC PANEL
Anion gap: 11 (ref 5–15)
Anion gap: 16 — ABNORMAL HIGH (ref 5–15)
BUN: 29 mg/dL — ABNORMAL HIGH (ref 8–23)
BUN: 29 mg/dL — ABNORMAL HIGH (ref 8–23)
CO2: 20 mmol/L — ABNORMAL LOW (ref 22–32)
CO2: 19 mmol/L — ABNORMAL LOW (ref 22–32)
Calcium: 9.1 mg/dL (ref 8.9–10.3)
Calcium: 9.2 mg/dL (ref 8.9–10.3)
Chloride: 104 mmol/L (ref 98–111)
Chloride: 99 mmol/L (ref 98–111)
Creatinine, Ser: 2.01 mg/dL — ABNORMAL HIGH (ref 0.61–1.24)
Creatinine, Ser: 1.81 mg/dL — ABNORMAL HIGH (ref 0.61–1.24)
GFR calc Af Amer: 36 mL/min — ABNORMAL LOW (ref >60)
GFR calc Af Amer: 41 mL/min — ABNORMAL LOW (ref >60)
GFR calc non Af Amer: 31 mL/min — ABNORMAL LOW (ref >60)
GFR calc non Af Amer: 35 mL/min — ABNORMAL LOW (ref >60)
Glucose, Bld: 179 mg/dL — ABNORMAL HIGH (ref 70–99)
Glucose, Bld: 140 mg/dL — ABNORMAL HIGH (ref 70–99)
Potassium: 4.2 mmol/L (ref 3.5–5.1)
Potassium: 3.8 mmol/L (ref 3.5–5.1)
Sodium: 135 mmol/L (ref 135–145)
Sodium: 134 mmol/L — ABNORMAL LOW (ref 135–145)

## 2019-08-10 LAB — CBC
HCT: 36.4 % — ABNORMAL LOW (ref 39.0–52.0)
Hemoglobin: 12 g/dL — ABNORMAL LOW (ref 13.0–17.0)
MCH: 31.4 pg (ref 26.0–34.0)
MCHC: 33 g/dL (ref 30.0–36.0)
MCV: 95.3 fL (ref 80.0–100.0)
Platelets: 85 10*3/uL — ABNORMAL LOW (ref 150–400)
RBC: 3.82 MIL/uL — ABNORMAL LOW (ref 4.22–5.81)
RDW: 14.8 % (ref 11.5–15.5)
WBC: 6.9 10*3/uL (ref 4.0–10.5)
nRBC: 0 % (ref 0.0–0.2)

## 2019-08-10 LAB — TROPONIN I (HIGH SENSITIVITY): Troponin I (High Sensitivity): 10528 ng/L (ref <18)

## 2019-08-10 LAB — PHOSPHORUS: Phosphorus: 2.8 mg/dL (ref 2.5–4.6)

## 2019-08-10 LAB — MAGNESIUM: Magnesium: 2.2 mg/dL (ref 1.7–2.4)

## 2019-08-10 MED ORDER — SACUBITRIL-VALSARTAN 24-26 MG PO TABS
1.0000 | ORAL_TABLET | Freq: Two times a day (BID) | ORAL | Status: DC
  Administered 2019-08-10: 1 via ORAL
  Filled 2019-08-10: qty 1

## 2019-08-10 MED ORDER — ONDANSETRON HCL 4 MG/2ML IJ SOLN: 4.0000 mg | Freq: Four times a day (QID) | INTRAMUSCULAR | Status: DC | PRN

## 2019-08-10 MED ORDER — PRAVASTATIN SODIUM 40 MG PO TABS
80.0000 mg | ORAL_TABLET | Freq: Every day | ORAL | Status: DC
  Administered 2019-08-10 – 2019-08-13 (×4): 80 mg via ORAL
  Filled 2019-08-10: qty 2
  Filled 2019-08-10 (×2): qty 4
  Filled 2019-08-10: qty 2

## 2019-08-10 MED ORDER — APIXABAN 5 MG PO TABS
5.0000 mg | ORAL_TABLET | Freq: Two times a day (BID) | ORAL | Status: DC
  Administered 2019-08-10 – 2019-08-14 (×9): 5 mg via ORAL
  Filled 2019-08-10 (×9): qty 1

## 2019-08-10 MED ORDER — UMECLIDINIUM-VILANTEROL 62.5-25 MCG/INH IN AEPB
1.0000 | INHALATION_SPRAY | Freq: Every day | RESPIRATORY_TRACT | Status: DC
  Administered 2019-08-10 – 2019-08-14 (×5): 1 via RESPIRATORY_TRACT
  Filled 2019-08-10 (×2): qty 14

## 2019-08-10 MED ORDER — AMIODARONE HCL 200 MG PO TABS
200.0000 mg | ORAL_TABLET | Freq: Two times a day (BID) | ORAL | Status: DC
  Administered 2019-08-10: 200 mg via ORAL
  Filled 2019-08-10: qty 1

## 2019-08-10 MED ORDER — ACETAMINOPHEN 325 MG PO TABS: 650.0000 mg | ORAL_TABLET | Freq: Four times a day (QID) | ORAL | Status: DC | PRN

## 2019-08-10 MED ORDER — MONTELUKAST SODIUM 10 MG PO TABS
10.0000 mg | ORAL_TABLET | Freq: Every day | ORAL | Status: DC
  Administered 2019-08-10 – 2019-08-13 (×4): 10 mg via ORAL
  Filled 2019-08-10 (×4): qty 1

## 2019-08-10 MED ORDER — ACETAMINOPHEN 650 MG RE SUPP: 650.0000 mg | Freq: Four times a day (QID) | RECTAL | Status: DC | PRN

## 2019-08-10 MED ORDER — PANTOPRAZOLE SODIUM 40 MG PO TBEC
40.0000 mg | DELAYED_RELEASE_TABLET | Freq: Every day | ORAL | Status: DC
  Administered 2019-08-10 – 2019-08-14 (×5): 40 mg via ORAL
  Filled 2019-08-10 (×5): qty 1

## 2019-08-10 MED ORDER — ONDANSETRON HCL 4 MG PO TABS: 4.0000 mg | ORAL_TABLET | Freq: Four times a day (QID) | ORAL | Status: DC | PRN

## 2019-08-10 MED ORDER — CHLORHEXIDINE GLUCONATE CLOTH 2 % EX PADS
6.0000 | MEDICATED_PAD | Freq: Every day | CUTANEOUS | Status: DC
  Administered 2019-08-10 – 2019-08-12 (×3): 6 via TOPICAL

## 2019-08-10 NOTE — Progress Notes (Addendum)
At approx. 1550, patient went in vtach.  Patient defibrillator went off.  Patient states he felt it.  Dr. Clayborn Bigness made aware. New orders received.  Patient now resting without complaints. Patient has had three more times his defibrillator has gone off since the initial shock.  Order for ICU transfer from Dr. Vianne Bulls.

## 2019-08-10 NOTE — Progress Notes (Signed)
Patient transferred to ICU via bed. Report given too RN ICU.

## 2019-08-10 NOTE — Progress Notes (Signed)
Spoke with Dr. Everlene Balls, recommended to wean off amiodarone drip after giving oral amiodarone.  Patient needs amiodarone 200 mg p.o. twice daily for 2 weeks followed by 200mg  daily at discharge.

## 2019-08-10 NOTE — ED Notes (Signed)
Report called to 2A 

## 2019-08-10 NOTE — Progress Notes (Signed)
[facility] Progress Note Patient Name: Rick Gilmore DOB: 1941/03/17 MRN: [id number]   Date of Service  08/10/2019  HPI/Events of Note  77/M with shortness of breath, found to be in sustained Vtach. Pt has defibrillator in place. Pt was on the floors when his defibrillator went off. Pt's cardiologist made aware. Pt transferred to the ICU for closer monitoring.   On video assessment, pt is awake and alert.  He is talking to family on the phone. BP 128/68, HR 99, RR 20, O2 sats 93%.  eICU Interventions  Recheck electrolytes, calcium, mg, phos. Replete lytes as needed. Continue amiodarone gtt.  Pt is on apixaban.  Pt is on PPI.  Cardiology is following.  He will need AICD interrogation.        Elsie Lincoln 08/10/2019, 8:09 PM

## 2019-08-10 NOTE — Consult Note (Signed)
Reason for Consult: Congestive heart failure ventricular tachycardia shortness of breath Referring Physician: Dr. Lance Coon hospitalist Deborra Medina primary  Rick Gilmore is an 78 y.o. male.  HPI: History of shortness of breath dyspnea tachycardia patient had malaise weakness fatigue diaphoresis he recently called EMS found to be in rapid atrial fibrillation then developed sustained ventricular tachycardia which lasted almost 4 hours he subsequently converted to sinus rhythm he is not sure if the defibrillator went off pacemaker interrogated were interrogated with suggested he may lead to discharges he was placed on amiodarone for suppressive therapy denies any significant chest pain does complain of shortness of breath dyspnea fatigue lightheadedness weakness  Past Medical History:  Diagnosis Date  . AICD (automatic cardioverter/defibrillator) present   . Anemia   . Arrhythmia    a fib  . Atrial fibrillation ([place])    on Coumadin  . CAD (coronary artery disease)   . Cancer [facility]) 2001   Prostate, XRT and implant  . CHF (congestive heart failure) ([place])   . COPD (chronic obstructive pulmonary disease) ([place])   . Erectile dysfunction   . GERD (gastroesophageal reflux disease)   . Hyperlipidemia   . Hypertension   . Ischemic cardiomyopathy    s/p AICD/pacer 2009, replaced 2010 [facility]  . MI (myocardial infarction) ([place]) 1988  . Personal history of prostate cancer 2001   s/p XRT and implant ([place])  . Prostate cancer ([employer])   . Sleep apnea, obstructive    uses CPAP nightly  . Thrombocytopenia ([place])   . Tubular adenoma    colon polyp  . Vitamin B 12 deficiency     Past Surgical History:  Procedure Laterality Date  . CARDIAC CATHETERIZATION    . COLONOSCOPY WITH PROPOFOL [name] 05/30/2015   Procedure: COLONOSCOPY WITH PROPOFOL;  Surgeon: Manya Silvas, MD;  Location: [facility] ENDOSCOPY;  Service: Endoscopy;  Laterality: [name];  . COLONOSCOPY WITH PROPOFOL [name] 07/16/2019   Procedure:  COLONOSCOPY WITH PROPOFOL;  Surgeon: Toledo, Benay Pike, MD;  Location: [facility] ENDOSCOPY;  Service: Gastroenterology;  Laterality: [name];  . CORONARY ARTERY BYPASS GRAFT  1988  . ESOPHAGOGASTRODUODENOSCOPY [name] 05/30/2015   Procedure: ESOPHAGOGASTRODUODENOSCOPY (EGD);  Surgeon: Manya Silvas, MD;  Location: [facility] ENDOSCOPY;  Service: Endoscopy;  Laterality: [name];  . ESOPHAGOGASTRODUODENOSCOPY (EGD) WITH PROPOFOL [name] 07/16/2019   Procedure: ESOPHAGOGASTRODUODENOSCOPY (EGD) WITH PROPOFOL;  Surgeon: Toledo, Benay Pike, MD;  Location: [facility] ENDOSCOPY;  Service: Gastroenterology;  Laterality: [name];  . IMPLANTABLE CARDIOVERTER DEFIBRILLATOR GENERATOR CHANGE  April 2014   Bi V RRR  . INSERT / REPLACE / REMOVE PACEMAKER  2014  . VASECTOMY      Family History  Problem Relation Age of Onset  . Hypertension Mother   . Hypertension Father   . Cancer Father        prostate, throat  . Emphysema Father   . Heart disease Maternal Grandmother   . Heart disease Paternal Grandmother     Social History:  reports that he has never smoked. He has never used smokeless tobacco. He reports current alcohol use of about 4.0 standard drinks of alcohol per week. He reports that he does not use drugs.  Allergies: No Known Allergies  Medications: I have reviewed the patient's current medications.  Results for orders placed or performed during the hospital encounter of 08/09/19 (from the past 48 hour(s))  CBC with Differential/Platelet     Status: Abnormal   Collection Time: 08/09/19  9:13 PM  Result Value Ref Range   WBC 13.1 (H)  4.0 - 10.5 K/uL   RBC 4.01 (L) 4.22 - 5.81 MIL/uL   Hemoglobin 12.6 (L) 13.0 - 17.0 g/dL   HCT 38.4 (L) 39.0 - 52.0 %   MCV 95.8 80.0 - 100.0 fL   MCH 31.4 26.0 - 34.0 pg   MCHC 32.8 30.0 - 36.0 g/dL   RDW 14.9 11.5 - 15.5 %   Platelets 121 (L) 150 - 400 K/uL    Comment: Immature Platelet Fraction may be clinically indicated, consider ordering this additional test [id number]    nRBC 0.0  0.0 - 0.2 %   Neutrophils Relative % 90 %   Neutro Abs 11.5 (H) 1.7 - 7.7 K/uL   Lymphocytes Relative 5 %   Lymphs Abs 0.7 0.7 - 4.0 K/uL   Monocytes Relative 4 %   Monocytes Absolute 0.6 0.1 - 1.0 K/uL   Eosinophils Relative 0 %   Eosinophils Absolute 0.0 0.0 - 0.5 K/uL   Basophils Relative 0 %   Basophils Absolute 0.0 0.0 - 0.1 K/uL   Immature Granulocytes 1 %   Abs Immature Granulocytes 0.06 0.00 - 0.07 K/uL    Comment: Performed at [facility], [address] [place], [place] [address]  Comprehensive metabolic panel     Status: Abnormal   Collection Time: 08/09/19  9:13 PM  Result Value Ref Range   Sodium 137 135 - 145 mmol/L   Potassium 4.2 3.5 - 5.1 mmol/L   Chloride 104 98 - 111 mmol/L   CO2 20 (L) 22 - 32 mmol/L   Glucose, Bld 197 (H) 70 - 99 mg/dL   BUN 24 (H) 8 - 23 mg/dL   Creatinine, Ser 1.77 (H) 0.61 - 1.24 mg/dL   Calcium 9.4 8.9 - 10.3 mg/dL   Total Protein 7.1 6.5 - 8.1 g/dL   Albumin 4.1 3.5 - 5.0 g/dL   AST 112 (H) 15 - 41 U/L   ALT 32 0 - 44 U/L   Alkaline Phosphatase 66 38 - 126 U/L   Total Bilirubin 1.9 (H) 0.3 - 1.2 mg/dL   GFR calc non Af Amer 36 (L) >60 mL/min   GFR calc Af Amer 42 (L) >60 mL/min   Anion gap 13 5 - 15    Comment: Performed at [facility], [address] [place], [place] [address]  Troponin I (High Sensitivity)     Status: Abnormal   Collection Time: 08/09/19  9:13 PM  Result Value Ref Range   Troponin I (High Sensitivity) 11,741 (HH) <18 ng/L    Comment: CRITICAL RESULT CALLED TO, READ BACK BY AND VERIFIED WITH AMY COYNE 08/09/19 @ 2157  [place] (NOTE) Elevated high sensitivity troponin I (hsTnI) values and significant  changes across serial measurements may suggest ACS but many other  chronic and acute conditions are known to elevate hsTnI results.  Refer to the "Links" section for chest pain algorithms and additional  guidance. Performed at [facility], [address] [place], [place] [address]    SARS Coronavirus 2 [facility] order, Performed in [facility] hospital lab) Nasopharyngeal Nasopharyngeal Swab     Status: None   Collection Time: 08/09/19  9:13 PM   Specimen: Nasopharyngeal Swab  Result Value Ref Range   SARS Coronavirus 2 NEGATIVE NEGATIVE    Comment: (NOTE) If result is NEGATIVE SARS-CoV-2 target nucleic acids are NOT DETECTED. The SARS-CoV-2 RNA is generally detectable in upper and lower  respiratory specimens during the acute phase of infection. The lowest  concentration of  SARS-CoV-2 viral copies this assay can detect is 250  copies / mL. A negative result does not preclude SARS-CoV-2 infection  and should not be used as the sole basis for treatment or other  patient management decisions.  A negative result may occur with  improper specimen collection / handling, submission of specimen other  than nasopharyngeal swab, presence of viral mutation(s) within the  areas targeted by this assay, and inadequate number of viral copies  (<250 copies / mL). A negative result must be combined with clinical  observations, patient history, and epidemiological information. If result is POSITIVE SARS-CoV-2 target nucleic acids are DETECTED. The SARS-CoV-2 RNA is generally detectable in upper and lower  respiratory specimens dur ing the acute phase of infection.  Positive  results are indicative of active infection with SARS-CoV-2.  Clinical  correlation with patient history and other diagnostic information is  necessary to determine patient infection status.  Positive results do  not rule out bacterial infection or co-infection with other viruses. If result is PRESUMPTIVE POSTIVE SARS-CoV-2 nucleic acids MAY BE PRESENT.   A presumptive positive result was obtained on the submitted specimen  and confirmed on repeat testing.  While 2019 novel coronavirus  (SARS-CoV-2) nucleic acids may be present in the submitted sample  additional confirmatory testing may be necessary for  epidemiological  and / or clinical management purposes  to differentiate between  SARS-CoV-2 and other Sarbecovirus currently known to infect humans.  If clinically indicated additional testing with an alternate test  methodology [id number]) is advised. The SARS-CoV-2 RNA is generally  detectable in upper and lower respiratory sp ecimens during the acute  phase of infection. The expected result is Negative. Fact Sheet for Patients:  StrictlyIdeas.no Fact Sheet for Healthcare Providers: BankingDealers.co.za This test is not yet approved or cleared by the [place] FDA and has been authorized for detection and/or diagnosis of SARS-CoV-2 by FDA under an Emergency Use Authorization (EUA).  This EUA will remain in effect (meaning this test can be used) for the duration of the COVID-19 declaration under Section 564(b)(1) of the Act, 21 U.S.C. section 360bbb-3(b)(1), unless the authorization is terminated or revoked sooner. Performed at [facility] [place], [place] [address]   Troponin I (High Sensitivity)     Status: Abnormal   Collection Time: 08/09/19 11:25 PM  Result Value Ref Range   Troponin I (High Sensitivity) 10,528 (HH) <18 ng/L    Comment: CRITICAL VALUE NOTED. VALUE IS CONSISTENT WITH PREVIOUSLY REPORTED/CALLED VALUE [place] (NOTE) Elevated high sensitivity troponin I (hsTnI) values and significant  changes across serial measurements may suggest ACS but many other  chronic and acute conditions are known to elevate hsTnI results.  Refer to the "Links" section for chest pain algorithms and additional  guidance. Performed at [facility], [address] [place], [place] [address]   Basic metabolic panel     Status: Abnormal   Collection Time: 08/10/19  5:01 AM  Result Value Ref Range   Sodium 135 135 - 145 mmol/L   Potassium 4.2 3.5 - 5.1 mmol/L   Chloride 104 98 - 111 mmol/L   CO2 20 (L) 22  - 32 mmol/L   Glucose, Bld 179 (H) 70 - 99 mg/dL   BUN 29 (H) 8 - 23 mg/dL   Creatinine, Ser 2.01 (H) 0.61 - 1.24 mg/dL   Calcium 9.1 8.9 - 10.3 mg/dL   GFR calc non Af Amer 31 (L) >60 mL/min   GFR calc  Af Amer 36 (L) >60 mL/min   Anion gap 11 5 - 15    Comment: Performed at [facility], [address] [place], [place] [address]  CBC     Status: Abnormal   Collection Time: 08/10/19  5:01 AM  Result Value Ref Range   WBC 6.9 4.0 - 10.5 K/uL   RBC 3.82 (L) 4.22 - 5.81 MIL/uL   Hemoglobin 12.0 (L) 13.0 - 17.0 g/dL   HCT 36.4 (L) 39.0 - 52.0 %   MCV 95.3 80.0 - 100.0 fL   MCH 31.4 26.0 - 34.0 pg   MCHC 33.0 30.0 - 36.0 g/dL   RDW 14.8 11.5 - 15.5 %   Platelets 85 (L) 150 - 400 K/uL    Comment: Immature Platelet Fraction may be clinically indicated, consider ordering this additional test [id number]    nRBC 0.0 0.0 - 0.2 %    Comment: Performed at [facility], [address], [place], [place] [address]    Dg Chest Portable 1 View  Result Date: 08/09/2019 CLINICAL DATA:  Acute onset of shortness of breath, diaphoresis and nausea while at home earlier today. Current history of CHF. Prior CABG. EXAM: PORTABLE CHEST 1 VIEW COMPARISON:  12/26/2018 and earlier, including CT chest 10/07/2014. FINDINGS: Prior sternotomy for CABG. LEFT subclavian biventricular pacing defibrillator, unchanged. Cardiac silhouette moderately to markedly enlarged, unchanged. Pulmonary venous hypertension and minimal to mild interstitial pulmonary edema. Small RIGHT pleural effusion. No confluent airspace consolidation. IMPRESSION: Minimal to mild CHF, with stable moderate to marked cardiomegaly and minimal to mild diffuse interstitial pulmonary edema. Small RIGHT pleural effusion. Electronically Signed   By: Evangeline Dakin M.D.   On: 08/09/2019 21:51    Review of Systems  Constitutional: Positive for diaphoresis and malaise/fatigue.  HENT: Positive for congestion.   Eyes: Negative.    Respiratory: Positive for shortness of breath.   Cardiovascular: Positive for palpitations, orthopnea and PND.  Gastrointestinal: Negative.   Genitourinary: Negative.   Musculoskeletal: Negative.   Skin: Negative.   Neurological: Positive for dizziness and weakness.  Endo/Heme/Allergies: Negative.   Psychiatric/Behavioral: Negative.    Blood pressure 99/60, pulse 65, temperature 98.4 F (36.9 C), resp. rate 19, height 6\' 2"  (1.88 m), weight 121.7 kg, SpO2 96 %. Physical Exam  Nursing note and vitals reviewed. Constitutional: He is oriented to person, place, and time. He appears well-developed and well-nourished.  HENT:  Head: Normocephalic and atraumatic.  Eyes: Pupils are equal, round, and reactive to light. Conjunctivae and EOM are normal.  Neck: Normal range of motion. Neck supple.  Cardiovascular: Normal rate and regular rhythm. Exam reveals gallop.  Murmur heard. Respiratory: Effort normal and breath sounds normal.  GI: Soft. Bowel sounds are normal.  Musculoskeletal: Normal range of motion.  Neurological: He is alert and oriented to person, place, and time. He has normal reflexes.  Skin: Skin is warm and dry.  Psychiatric: He has a normal mood and affect.    Assessment/Plan: Shortness of breath Ventricular tachycardia Atrial fibrillation atrial flutter paroxysmal Congestive heart failure systolic dysfunction Coronary artery disease COPD Ischemic cardiomyopathy AICD/pacer in place Hypertension Chronic renal sufficiency stage III . Plan Agree with admit to telemetry Recommend IV amiodarone load for suppression of A. fib a flutter as well as ventricular tachycardia Proceed with AICD pacemaker interrogation Heart failure therapy with diuretics Inhalers for COPD shortness of breath Continue hypertension management Maintain lipid management for hyperlipidemia Continue anticoagulation for atrial fibrillation Continue Pravachol therapy for lipid management Agree  with  Entresto for heart failure therapy    D  08/10/2019, 2:28 PM

## 2019-08-10 NOTE — Progress Notes (Signed)
[facility] [place] NAME: Rick Gilmore    MR#:  [id number]  DATE OF BIRTH:  12-01-1941  SUBJECTIVE: Patient is admitted yesterday for shortness of breath, found to have sustained V. tach, patient has defibrillator for the past 10 years.  Patient on amiodarone drip, feels better, shortness of breath improved.  CHIEF COMPLAINT:   Chief Complaint  Patient presents with  . Shortness of Breath  Patient feels better, no chest pain or shortness of breath.  REVIEW OF SYSTEMS:   ROS CONSTITUTIONAL: No fever, fatigue or weakness.  EYES: No blurred or double vision.  EARS, NOSE, AND THROAT: No tinnitus or ear pain.  RESPIRATORY: No cough, shortness of breath, wheezing or hemoptysis.  CARDIOVASCULAR: No chest pain, orthopnea, edema.  GASTROINTESTINAL: No nausea, vomiting, diarrhea or abdominal pain.  GENITOURINARY: No dysuria, hematuria.  ENDOCRINE: No polyuria, nocturia,  HEMATOLOGY: No anemia, easy bruising or bleeding SKIN: No rash or lesion. MUSCULOSKELETAL: No joint pain or arthritis.   NEUROLOGIC: No tingling, numbness, weakness.  PSYCHIATRY: No anxiety or depression.   DRUG ALLERGIES:  No Known Allergies  VITALS:  Blood pressure 99/60, pulse 65, temperature 98.4 F (36.9 C), resp. rate 19, height 6\' 2"  (1.88 m), weight 121.7 kg, SpO2 96 %.  PHYSICAL EXAMINATION:  GENERAL:  78 y.o.-year-old patient lying in the bed with no acute distress.  EYES: Pupils equal, round, reactive to light and accommodation. No scleral icterus. Extraocular muscles intact.  HEENT: Head atraumatic, normocephalic. Oropharynx and nasopharynx clear.  NECK:  Supple, no jugular venous distention. No thyroid enlargement, no tenderness.  LUNGS: Normal breath sounds bilaterally, no wheezing, rales,rhonchi or crepitation. No use of accessory muscles of respiration.  CARDIOVASCULAR: S1, S2 normal. No murmurs, rubs, or gallops.  ABDOMEN: Soft, nontender,  nondistended. Bowel sounds present. No organomegaly or mass.  EXTREMITIES: No pedal edema, cyanosis, or clubbing.  NEUROLOGIC: Cranial nerves II through XII are intact. Muscle strength 5/5 in all extremities. Sensation intact. Gait not checked.  PSYCHIATRIC: The patient is alert and oriented x 3.  SKIN: No obvious rash, lesion, or ulcer.    LABORATORY PANEL:   CBC Recent Labs  Lab 08/10/19 0501  WBC 6.9  HGB 12.0*  HCT 36.4*  PLT 85*   ------------------------------------------------------------------------------------------------------------------  Chemistries  Recent Labs  Lab 08/09/19 2113 08/10/19 0501  NA 137 135  K 4.2 4.2  CL 104 104  CO2 20* 20*  GLUCOSE 197* 179*  BUN 24* 29*  CREATININE 1.77* 2.01*  CALCIUM 9.4 9.1  AST 112*  --   ALT 32  --   ALKPHOS 66  --   BILITOT 1.9*  --    ------------------------------------------------------------------------------------------------------------------  Cardiac Enzymes No results for input(s): TROPONINI in the last 168 hours. ------------------------------------------------------------------------------------------------------------------  RADIOLOGY:  Dg Chest Portable 1 View  Result Date: 08/09/2019 CLINICAL DATA:  Acute onset of shortness of breath, diaphoresis and nausea while at home earlier today. Current history of CHF. Prior CABG. EXAM: PORTABLE CHEST 1 VIEW COMPARISON:  12/26/2018 and earlier, including CT chest 10/07/2014. FINDINGS: Prior sternotomy for CABG. LEFT subclavian biventricular pacing defibrillator, unchanged. Cardiac silhouette moderately to markedly enlarged, unchanged. Pulmonary venous hypertension and minimal to mild interstitial pulmonary edema. Small RIGHT pleural effusion. No confluent airspace consolidation. IMPRESSION: Minimal to mild CHF, with stable moderate to marked cardiomegaly and minimal to mild diffuse interstitial pulmonary edema. Small RIGHT pleural effusion. Electronically Signed    By: Evangeline Dakin M.D.   On: 08/09/2019 21:51  EKG:   Orders placed or performed during the hospital encounter of 08/09/19  . EKG 12-Lead  . EKG 12-Lead    ASSESSMENT AND PLAN:   Sustained V. tach status post defibrillator discharges, continue amiodarone drip, cardiology Dr. Clayborn Bigness has seen the patient.  Troponins are elevated secondary to V. tach, possible defibrillator discharges. 2.  Chronic systolic heart failure, patient is hypotensive so hold Entresto. 3.  Acute kidney injury likely due to hypotension and ATN, hold Entresto, monitor kidney function  3.  Hypertension cirrhosis: Hold hepatotoxins.    All the records are reviewed and case discussed with Care Management/Social Workerr. Management plans discussed with the patient, family and they are in agreement.  CODE STATUS: Full code TOTAL TIME TAKING CARE OF THIS PATIENT: 35 minutes.   POSSIBLE D/C IN 1-2 DAYS, DEPENDING ON CLINICAL CONDITION. More than 50% time spent in counseling, coordination of care  Epifanio Lesches M.D on 08/10/2019 at 12:08 PM  Between 7am to 6pm - Pager - [phone]  After 6pm go to [email or web address] - password [email or web address] [place]  Office  [phone]  CC: Primary care physician; Crecencio Mc, MD   Note: This dictation was prepared with Dragon dictation along with smaller phrase technology. Any transcriptional errors that result from this process are unintentional.

## 2019-08-10 NOTE — ED Notes (Signed)
ED TO INPATIENT HANDOFF REPORT  ED Nurse Name and Phone #: Valetta Fuller [phone]  S Name/Age/Gender Rick Gilmore 78 y.o. male Room/Bed: ED09A/ED09A  Code Status   Code Status: Prior  Home/SNF/Other Home Patient oriented to: self, place, time and situation Is this baseline? Yes   Triage Complete: Triage complete  Chief Complaint Chest pain  Triage Note Pt brought in via ems from home with sob.  Pt denies chest pain.  Pt alert.  Iv in place.     Allergies No Known Allergies  Level of Care/Admitting Diagnosis ED Disposition    ED Disposition Condition [facility] Area: [employer] [100120]  Level of Care: Telemetry [5]  Covid Evaluation: Confirmed COVID Negative  Diagnosis: V-tach [facility]) [[id number]]  Admitting Physician: Lance Coon [[id number]]  Attending Physician: Jannifer Franklin, DAVID [phone]  Bed request comments: 2a  PT Class (Do Not Modify): Observation [104]  PT Acc Code (Do Not Modify): Observation [10022]       B Medical/Surgery History Past Medical History:  Diagnosis Date  . AICD (automatic cardioverter/defibrillator) present   . Anemia   . Arrhythmia    a fib  . Atrial fibrillation ([place])    on Coumadin  . CAD (coronary artery disease)   . Cancer [facility]) 2001   Prostate, XRT and implant  . CHF (congestive heart failure) ([place])   . COPD (chronic obstructive pulmonary disease) ([place])   . Erectile dysfunction   . GERD (gastroesophageal reflux disease)   . Hyperlipidemia   . Hypertension   . Ischemic cardiomyopathy    s/p AICD/pacer 2009, replaced 2010 [facility]  . MI (myocardial infarction) ([place]) 1988  . Personal history of prostate cancer 2001   s/p XRT and implant ([place])  . Prostate cancer ([place])   . Sleep apnea, obstructive    uses CPAP nightly  . Thrombocytopenia ([place])   . Tubular adenoma    colon polyp  . Vitamin B 12 deficiency    Past Surgical History:  Procedure Laterality Date  . CARDIAC CATHETERIZATION    .  COLONOSCOPY WITH PROPOFOL [name] 05/30/2015   Procedure: COLONOSCOPY WITH PROPOFOL;  Surgeon: Manya Silvas, MD;  Location: [facility] ENDOSCOPY;  Service: Endoscopy;  Laterality: [name];  . COLONOSCOPY WITH PROPOFOL [name] 07/16/2019   Procedure: COLONOSCOPY WITH PROPOFOL;  Surgeon: Toledo, Benay Pike, MD;  Location: [facility] ENDOSCOPY;  Service: Gastroenterology;  Laterality: [name];  . CORONARY ARTERY BYPASS GRAFT  1988  . ESOPHAGOGASTRODUODENOSCOPY [name] 05/30/2015   Procedure: ESOPHAGOGASTRODUODENOSCOPY (EGD);  Surgeon: Manya Silvas, MD;  Location: [facility] ENDOSCOPY;  Service: Endoscopy;  Laterality: [name];  . ESOPHAGOGASTRODUODENOSCOPY (EGD) WITH PROPOFOL [name] 07/16/2019   Procedure: ESOPHAGOGASTRODUODENOSCOPY (EGD) WITH PROPOFOL;  Surgeon: Toledo, Benay Pike, MD;  Location: [facility] ENDOSCOPY;  Service: Gastroenterology;  Laterality: [name];  . IMPLANTABLE CARDIOVERTER DEFIBRILLATOR GENERATOR CHANGE  April 2014   Bi V RRR  . INSERT / REPLACE / REMOVE PACEMAKER  2014  . VASECTOMY       A IV Location/Drains/Wounds Patient Lines/Drains/Airways Status   Active Line/Drains/Airways    Name:   Placement date:   Placement time:   Site:   Days:   Peripheral IV 08/09/19 Left Antecubital   08/09/19    2120    Antecubital   1   Peripheral IV 08/09/19 Right Hand   08/09/19    2120    Hand   1          Intake/Output Last 24 hours No intake or output data in  the 24 hours ending 08/10/19 0007  Labs/Imaging Results for orders placed or performed during the hospital encounter of 08/09/19 (from the past 48 hour(s))  CBC with Differential/Platelet     Status: Abnormal   Collection Time: 08/09/19  9:13 PM  Result Value Ref Range   WBC 13.1 (H) 4.0 - 10.5 K/uL   RBC 4.01 (L) 4.22 - 5.81 MIL/uL   Hemoglobin 12.6 (L) 13.0 - 17.0 g/dL   HCT 38.4 (L) 39.0 - 52.0 %   MCV 95.8 80.0 - 100.0 fL   MCH 31.4 26.0 - 34.0 pg   MCHC 32.8 30.0 - 36.0 g/dL   RDW 14.9 11.5 - 15.5 %   Platelets 121 (L) 150 - 400 K/uL    Comment: Immature  Platelet Fraction may be clinically indicated, consider ordering this additional test [id number]    nRBC 0.0 0.0 - 0.2 %   Neutrophils Relative % 90 %   Neutro Abs 11.5 (H) 1.7 - 7.7 K/uL   Lymphocytes Relative 5 %   Lymphs Abs 0.7 0.7 - 4.0 K/uL   Monocytes Relative 4 %   Monocytes Absolute 0.6 0.1 - 1.0 K/uL   Eosinophils Relative 0 %   Eosinophils Absolute 0.0 0.0 - 0.5 K/uL   Basophils Relative 0 %   Basophils Absolute 0.0 0.0 - 0.1 K/uL   Immature Granulocytes 1 %   Abs Immature Granulocytes 0.06 0.00 - 0.07 K/uL    Comment: Performed at [facility], [address] [place], [place] [address]  Comprehensive metabolic panel     Status: Abnormal   Collection Time: 08/09/19  9:13 PM  Result Value Ref Range   Sodium 137 135 - 145 mmol/L   Potassium 4.2 3.5 - 5.1 mmol/L   Chloride 104 98 - 111 mmol/L   CO2 20 (L) 22 - 32 mmol/L   Glucose, Bld 197 (H) 70 - 99 mg/dL   BUN 24 (H) 8 - 23 mg/dL   Creatinine, Ser 1.77 (H) 0.61 - 1.24 mg/dL   Calcium 9.4 8.9 - 10.3 mg/dL   Total Protein 7.1 6.5 - 8.1 g/dL   Albumin 4.1 3.5 - 5.0 g/dL   AST 112 (H) 15 - 41 U/L   ALT 32 0 - 44 U/L   Alkaline Phosphatase 66 38 - 126 U/L   Total Bilirubin 1.9 (H) 0.3 - 1.2 mg/dL   GFR calc non Af Amer 36 (L) >60 mL/min   GFR calc Af Amer 42 (L) >60 mL/min   Anion gap 13 5 - 15    Comment: Performed at [facility], [address] [place], [place] [address]  Troponin I (High Sensitivity)     Status: Abnormal   Collection Time: 08/09/19  9:13 PM  Result Value Ref Range   Troponin I (High Sensitivity) 11,741 (HH) <18 ng/L    Comment: CRITICAL RESULT CALLED TO, READ BACK BY AND VERIFIED WITH AMY COYNE 08/09/19 @ 2157  [place] (NOTE) Elevated high sensitivity troponin I (hsTnI) values and significant  changes across serial measurements may suggest ACS but many other  chronic and acute conditions are known to elevate hsTnI results.  Refer to the "Links" section for chest pain algorithms  and additional  guidance. Performed at [facility], [address], [place], [place] [address]   SARS Coronavirus 2 [facility] order, Performed in [facility] hospital lab) Nasopharyngeal Nasopharyngeal Swab     Status: None   Collection Time: 08/09/19  9:13 PM   Specimen: Nasopharyngeal  Swab  Result Value Ref Range   SARS Coronavirus 2 NEGATIVE NEGATIVE    Comment: (NOTE) If result is NEGATIVE SARS-CoV-2 target nucleic acids are NOT DETECTED. The SARS-CoV-2 RNA is generally detectable in upper and lower  respiratory specimens during the acute phase of infection. The lowest  concentration of SARS-CoV-2 viral copies this assay can detect is 250  copies / mL. A negative result does not preclude SARS-CoV-2 infection  and should not be used as the sole basis for treatment or other  patient management decisions.  A negative result may occur with  improper specimen collection / handling, submission of specimen other  than nasopharyngeal swab, presence of viral mutation(s) within the  areas targeted by this assay, and inadequate number of viral copies  (<250 copies / mL). A negative result must be combined with clinical  observations, patient history, and epidemiological information. If result is POSITIVE SARS-CoV-2 target nucleic acids are DETECTED. The SARS-CoV-2 RNA is generally detectable in upper and lower  respiratory specimens dur ing the acute phase of infection.  Positive  results are indicative of active infection with SARS-CoV-2.  Clinical  correlation with patient history and other diagnostic information is  necessary to determine patient infection status.  Positive results do  not rule out bacterial infection or co-infection with other viruses. If result is PRESUMPTIVE POSTIVE SARS-CoV-2 nucleic acids MAY BE PRESENT.   A presumptive positive result was obtained on the submitted specimen  and confirmed on repeat testing.  While 2019 novel coronavirus  (SARS-CoV-2)  nucleic acids may be present in the submitted sample  additional confirmatory testing may be necessary for epidemiological  and / or clinical management purposes  to differentiate between  SARS-CoV-2 and other Sarbecovirus currently known to infect humans.  If clinically indicated additional testing with an alternate test  methodology [phone]) is advised. The SARS-CoV-2 RNA is generally  detectable in upper and lower respiratory sp ecimens during the acute  phase of infection. The expected result is Negative. Fact Sheet for Patients:  StrictlyIdeas.no Fact Sheet for Healthcare Providers: BankingDealers.co.za This test is not yet approved or cleared by the [place] FDA and has been authorized for detection and/or diagnosis of SARS-CoV-2 by FDA under an Emergency Use Authorization (EUA).  This EUA will remain in effect (meaning this test can be used) for the duration of the COVID-19 declaration under Section 564(b)(1) of the Act, 21 U.S.C. section 360bbb-3(b)(1), unless the authorization is terminated or revoked sooner. Performed at [facility], [place], [place] [address]   Troponin I (High Sensitivity)     Status: Abnormal   Collection Time: 08/09/19 11:25 PM  Result Value Ref Range   Troponin I (High Sensitivity) 10,528 (HH) <18 ng/L    Comment: CRITICAL VALUE NOTED. VALUE IS CONSISTENT WITH PREVIOUSLY REPORTED/CALLED VALUE [place] (NOTE) Elevated high sensitivity troponin I (hsTnI) values and significant  changes across serial measurements may suggest ACS but many other  chronic and acute conditions are known to elevate hsTnI results.  Refer to the "Links" section for chest pain algorithms and additional  guidance. Performed at [facility], [address] [place], [place] [address]    Dg Chest Portable 1 View  Result Date: 08/09/2019 CLINICAL DATA:  Acute onset of shortness of breath,  diaphoresis and nausea while at home earlier today. Current history of CHF. Prior CABG. EXAM: PORTABLE CHEST 1 VIEW COMPARISON:  12/26/2018 and earlier, including CT chest 10/07/2014. FINDINGS: Prior sternotomy for CABG. LEFT subclavian  biventricular pacing defibrillator, unchanged. Cardiac silhouette moderately to markedly enlarged, unchanged. Pulmonary venous hypertension and minimal to mild interstitial pulmonary edema. Small RIGHT pleural effusion. No confluent airspace consolidation. IMPRESSION: Minimal to mild CHF, with stable moderate to marked cardiomegaly and minimal to mild diffuse interstitial pulmonary edema. Small RIGHT pleural effusion. Electronically Signed   By: Evangeline Dakin M.D.   On: 08/09/2019 21:51    Pending Labs [employer] (From admission, onward)    Start     Ordered   Signed and Held  CBC  (enoxaparin (LOVENOX)    CrCl >/= 30 ml/min)  Once,   R    Comments: Baseline for enoxaparin therapy IF NOT ALREADY DRAWN.  Notify MD if PLT < 100 K.    Signed and Held   Signed and Held  Creatinine, serum  (enoxaparin (LOVENOX)    CrCl >/= 30 ml/min)  Once,   R    Comments: Baseline for enoxaparin therapy IF NOT ALREADY DRAWN.    Signed and Held   Signed and Held  Creatinine, serum  (enoxaparin (LOVENOX)    CrCl >/= 30 ml/min)  Weekly,   R    Comments: while on enoxaparin therapy    Signed and Held   Signed and Held  Basic metabolic panel  Tomorrow morning,   R     Signed and Held   Signed and Held  CBC  Tomorrow morning,   R     Signed and Held          Vitals/Pain Today's Vitals   08/09/19 2221 08/09/19 2240 08/09/19 2300 08/10/19 0001  BP: 100/82 (!) 83/53 (!) 84/60 92/65  Pulse: 62  65 71  Resp: (!) 21 (!) 26 (!) 23 (!) 25  Temp:      TempSrc:      SpO2: 97%  96% 97%  PainSc:        Isolation Precautions Airborne and Contact precautions  Medications Medications  amiodarone (NEXTERONE PREMIX) 360-4.14 MG/200ML-% (1.8 mg/mL) IV infusion (30 mg/hr  Intravenous Rate/Dose Change 08/09/19 2345)  sodium chloride 0.9 % bolus 250 mL (0 mLs Intravenous Stopped 08/09/19 2206)    Mobility walks Low fall risk   Focused Assessments Cardiac Assessment Handoff:    Lab Results  Component Value Date   TROPONINI 0.06 (H) 01/17/2016   No results found for: DDIMER Does the Patient currently have chest pain? No     R Recommendations: See Admitting Provider Note  Report given to:   Additional Notes: Patient AOx4, came in from home with report of device shocking him  and associated SOB. Currently back to baseline, no SOB, no runs of VT since ED arrival.

## 2019-08-11 DIAGNOSIS — Z4502 Encounter for adjustment and management of automatic implantable cardiac defibrillator: Secondary | ICD-10-CM | POA: Diagnosis not present

## 2019-08-11 DIAGNOSIS — J449 Chronic obstructive pulmonary disease, unspecified: Secondary | ICD-10-CM | POA: Diagnosis not present

## 2019-08-11 DIAGNOSIS — I472 Ventricular tachycardia: Secondary | ICD-10-CM | POA: Diagnosis not present

## 2019-08-11 DIAGNOSIS — I5022 Chronic systolic (congestive) heart failure: Secondary | ICD-10-CM | POA: Diagnosis not present

## 2019-08-11 LAB — BASIC METABOLIC PANEL
Anion gap: 12 (ref 5–15)
BUN: 27 mg/dL — ABNORMAL HIGH (ref 8–23)
CO2: 21 mmol/L — ABNORMAL LOW (ref 22–32)
Calcium: 9 mg/dL (ref 8.9–10.3)
Chloride: 103 mmol/L (ref 98–111)
Creatinine, Ser: 1.38 mg/dL — ABNORMAL HIGH (ref 0.61–1.24)
GFR calc Af Amer: 57 mL/min — ABNORMAL LOW (ref >60)
GFR calc non Af Amer: 49 mL/min — ABNORMAL LOW (ref >60)
Glucose, Bld: 128 mg/dL — ABNORMAL HIGH (ref 70–99)
Potassium: 3.7 mmol/L (ref 3.5–5.1)
Sodium: 136 mmol/L (ref 135–145)

## 2019-08-11 MED ORDER — AMIODARONE HCL 200 MG PO TABS
400.0000 mg | ORAL_TABLET | Freq: Two times a day (BID) | ORAL | Status: DC
  Administered 2019-08-11: 12:00:00 400 mg via ORAL
  Filled 2019-08-11: qty 2

## 2019-08-11 MED ORDER — METOPROLOL SUCCINATE ER 50 MG PO TB24
75.0000 mg | ORAL_TABLET | Freq: Every day | ORAL | Status: DC
  Administered 2019-08-11 – 2019-08-14 (×4): 75 mg via ORAL
  Filled 2019-08-11: qty 1
  Filled 2019-08-11: qty 2
  Filled 2019-08-11: qty 1
  Filled 2019-08-11: qty 2

## 2019-08-11 MED ORDER — AMIODARONE HCL IN DEXTROSE 360-4.14 MG/200ML-% IV SOLN
30.0000 mg/h | INTRAVENOUS | Status: AC
  Administered 2019-08-11 – 2019-08-12 (×2): 60 mg/h via INTRAVENOUS
  Filled 2019-08-11 (×2): qty 200

## 2019-08-11 MED ORDER — AMIODARONE HCL IN DEXTROSE 360-4.14 MG/200ML-% IV SOLN
INTRAVENOUS | Status: AC
  Administered 2019-08-11: 60 mg/h via INTRAVENOUS
  Filled 2019-08-11: qty 200

## 2019-08-11 MED ORDER — SACUBITRIL-VALSARTAN 24-26 MG PO TABS
1.0000 | ORAL_TABLET | Freq: Two times a day (BID) | ORAL | Status: DC
  Administered 2019-08-11 – 2019-08-14 (×7): 1 via ORAL
  Filled 2019-08-11 (×8): qty 1

## 2019-08-11 MED ORDER — AMIODARONE HCL IN DEXTROSE 360-4.14 MG/200ML-% IV SOLN
60.0000 mg/h | INTRAVENOUS | Status: AC
  Administered 2019-08-11: 15:00:00 60 mg/h via INTRAVENOUS
  Filled 2019-08-11 (×2): qty 200

## 2019-08-11 MED ORDER — AMIODARONE IV BOLUS ONLY 150 MG/100ML
150.0000 mg | Freq: Once | INTRAVENOUS | Status: AC
  Administered 2019-08-11: 150 mg via INTRAVENOUS

## 2019-08-11 NOTE — Progress Notes (Signed)
Pt with multiple runs sustained vtach and was symptomatic. All PO meds had been  given, Dr Clayborn Bigness aware.  Writing RN placed 2 liters of O2 for SOB and WOB, pt reported feeling much better, vtach runs stopped at that time.  Per Dr Clayborn Bigness pt bolused with amiodarone IV and restarted an 20 mgs/hr continuous for now.  PO amiodarone stopped

## 2019-08-11 NOTE — Progress Notes (Signed)
UOP 250 ccs this shift, 0 in bladder per scanner.  Continue to monitor, no interventions at this time per Dr Mortimer Fries

## 2019-08-11 NOTE — Progress Notes (Addendum)
[place] at [place] NAME: Rasean Joos    MR#:  [id number]  DATE OF BIRTH:  1941-02-22  SUBJECTIVE: Status post defibrillator shocks yesterday multiple times, transferred the patient to ICU for close monitoring, on amiodarone drip, this morning he says he feels very exhausted.  No chest pain or shortness of breath.  Blood pressure is better, heart rate is around 80 bpm,  CHIEF COMPLAINT:   Chief Complaint  Patient presents with  . Shortness of Breath  States that he feels really exhausted, did not sleep last night /  REVIEW OF SYSTEMS:   Review of Systems  Constitutional: Positive for malaise/fatigue.   CONSTITUTIONAL: No fever, fatigue or weakness.  EYES: No blurred or double vision.  EARS, NOSE, AND THROAT: No tinnitus or ear pain.  RESPIRATORY: No cough, shortness of breath, wheezing or hemoptysis.  CARDIOVASCULAR: No chest pain, orthopnea, edema.  GASTROINTESTINAL: No nausea, vomiting, diarrhea or abdominal pain.  GENITOURINARY: No dysuria, hematuria.  ENDOCRINE: No polyuria, nocturia,  HEMATOLOGY: No anemia, easy bruising or bleeding SKIN: No rash or lesion. MUSCULOSKELETAL: No joint pain or arthritis.   NEUROLOGIC: No tingling, numbness, weakness.  PSYCHIATRY: No anxiety or depression.   DRUG ALLERGIES:  No Known Allergies  VITALS:  Blood pressure 136/70, pulse 78, temperature 97.9 F (36.6 C), temperature source Oral, resp. rate (!) 31, height 6\' 2"  (1.88 m), weight 123.1 kg, SpO2 91 %.  PHYSICAL EXAMINATION:  GENERAL:  78 y.o.-year-old patient lying in the bed with no acute distress.  EYES: Pupils equal, round, reactive to light and accommodation. No scleral icterus. Extraocular muscles intact.  HEENT: Head atraumatic, normocephalic. Oropharynx and nasopharynx clear.  NECK:  Supple, no jugular venous distention. No thyroid enlargement, no tenderness.  LUNGS: Normal breath sounds bilaterally, no wheezing,  rales,rhonchi or crepitation. No use of accessory muscles of respiration.  CARDIOVASCULAR: S1, S2 normal. No murmurs, rubs, or gallops.  ABDOMEN: Soft, nontender, nondistended. Bowel sounds present. No organomegaly or mass.  EXTREMITIES: No pedal edema, cyanosis, or clubbing.  NEUROLOGIC: Cranial nerves II through XII are intact. Muscle strength 5/5 in all extremities. Sensation intact. Gait not checked.  PSYCHIATRIC: The patient is alert and oriented x 3.  SKIN: No obvious rash, lesion, or ulcer.    LABORATORY PANEL:   CBC Recent Labs  Lab 08/10/19 0501  WBC 6.9  HGB 12.0*  HCT 36.4*  PLT 85*   ------------------------------------------------------------------------------------------------------------------  Chemistries  Recent Labs  Lab 08/09/19 2113  08/10/19 1953 08/11/19 0720  NA 137   < > 134* 136  K 4.2   < > 3.8 3.7  CL 104   < > 99 103  CO2 20*   < > 19* 21*  GLUCOSE 197*   < > 140* 128*  BUN 24*   < > 29* 27*  CREATININE 1.77*   < > 1.81* 1.38*  CALCIUM 9.4   < > 9.2 9.0  MG  --   --  2.2  --   AST 112*  --   --   --   ALT 32  --   --   --   ALKPHOS 66  --   --   --   BILITOT 1.9*  --   --   --    < > = values in this interval not displayed.   ------------------------------------------------------------------------------------------------------------------  Cardiac Enzymes No results for input(s): TROPONINI in the last 168 hours. ------------------------------------------------------------------------------------------------------------------  RADIOLOGY:  Dg Chest Portable  1 View  Result Date: 08/09/2019 CLINICAL DATA:  Acute onset of shortness of breath, diaphoresis and nausea while at home earlier today. Current history of CHF. Prior CABG. EXAM: PORTABLE CHEST 1 VIEW COMPARISON:  12/26/2018 and earlier, including CT chest 10/07/2014. FINDINGS: Prior sternotomy for CABG. LEFT subclavian biventricular pacing defibrillator, unchanged. Cardiac silhouette  moderately to markedly enlarged, unchanged. Pulmonary venous hypertension and minimal to mild interstitial pulmonary edema. Small RIGHT pleural effusion. No confluent airspace consolidation. IMPRESSION: Minimal to mild CHF, with stable moderate to marked cardiomegaly and minimal to mild diffuse interstitial pulmonary edema. Small RIGHT pleural effusion. Electronically Signed   By: Evangeline Dakin M.D.   On: 08/09/2019 21:51    EKG:   Orders placed or performed during the hospital encounter of 08/09/19  . EKG 12-Lead  . EKG 12-Lead    ASSESSMENT AND PLAN:   Sustained V. tach status post defibrillator discharges, .  Patient had multiple episodes of defibrillator shocks yesterday afternoon, patient also felt that time, patient transferred to ICU for close monitoring, patient on amiodarone drip, spoke with Dr. Clayborn Bigness, from cardiology, recommends giving amiodarone 400 mg p.o. twice daily, wean off amiodarone drip, increase beta-blockers to Toprol-XL 75 mg p.o. daily, restart amiodarone and see how he does. 2.  Chronic systolic heart failure, restart Entresto.  Was held yesterday secondary to hypotension, acute kidney injury, patient hypotension improved, acute kidney injury also is improving, restart Entresto.  Discussed with cardiology.   3.  Acute kidney injury likely due to hypotension and ATN, improving.  Creatinine is downtrending from 2.01-1.72.   3 cryptogenic liver cirrhosis, monitor closely.   All the records are reviewed and case discussed with Care Management/Social Workerr. Management plans discussed with the patient, family and they are in agreement.  CODE STATUS: Full code TOTAL TIME TAKING CARE OF THIS PATIENT: 45 minutes.   POSSIBLE D/C IN 1-2 DAYS, DEPENDING ON CLINICAL CONDITION. More than 50% time spent in counseling, coordination of care  Epifanio Lesches M.D on 08/11/2019 at 11:39 AM  Between 7am to 6pm - Pager - [phone]  After 6pm go to [email or web address] -  password [email or web address] [facility] [facility]  Office  [phone]  CC: Primary care physician; Crecencio Mc, MD   Note: This dictation was prepared with Dragon dictation along with smaller phrase technology. Any transcriptional errors that result from this process are unintentional.

## 2019-08-11 NOTE — Plan of Care (Signed)
Patient remains on amiodorone.  Technician from [place] in to [employer] AICD.  Patient rested in bed this shift.  Multiple instances of VT noted.  Patient asymptomatic with no complaints.  Alert and oriented. Makes needs known.  No c/o SOB.   Will continue to monitor.

## 2019-08-12 DIAGNOSIS — Z4502 Encounter for adjustment and management of automatic implantable cardiac defibrillator: Secondary | ICD-10-CM | POA: Diagnosis not present

## 2019-08-12 DIAGNOSIS — J449 Chronic obstructive pulmonary disease, unspecified: Secondary | ICD-10-CM | POA: Diagnosis not present

## 2019-08-12 DIAGNOSIS — I5022 Chronic systolic (congestive) heart failure: Secondary | ICD-10-CM | POA: Diagnosis not present

## 2019-08-12 DIAGNOSIS — I472 Ventricular tachycardia: Secondary | ICD-10-CM | POA: Diagnosis not present

## 2019-08-12 LAB — BASIC METABOLIC PANEL
Anion gap: 11 (ref 5–15)
BUN: 32 mg/dL — ABNORMAL HIGH (ref 8–23)
CO2: 22 mmol/L (ref 22–32)
Calcium: 8.6 mg/dL — ABNORMAL LOW (ref 8.9–10.3)
Chloride: 101 mmol/L (ref 98–111)
Creatinine, Ser: 1.4 mg/dL — ABNORMAL HIGH (ref 0.61–1.24)
GFR calc Af Amer: 56 mL/min — ABNORMAL LOW (ref >60)
GFR calc non Af Amer: 48 mL/min — ABNORMAL LOW (ref >60)
Glucose, Bld: 113 mg/dL — ABNORMAL HIGH (ref 70–99)
Potassium: 3.5 mmol/L (ref 3.5–5.1)
Sodium: 134 mmol/L — ABNORMAL LOW (ref 135–145)

## 2019-08-12 LAB — MAGNESIUM: Magnesium: 2.1 mg/dL (ref 1.7–2.4)

## 2019-08-12 LAB — PHOSPHORUS: Phosphorus: 3.6 mg/dL (ref 2.5–4.6)

## 2019-08-12 MED ORDER — POTASSIUM CHLORIDE CRYS ER 20 MEQ PO TBCR
40.0000 meq | EXTENDED_RELEASE_TABLET | Freq: Once | ORAL | Status: AC
  Administered 2019-08-12: 40 meq via ORAL
  Filled 2019-08-12: qty 2

## 2019-08-12 NOTE — Progress Notes (Signed)
[place] at [place] NAME: Rick Gilmore    MR#:  [id number]  DATE OF BIRTH:  03-15-1941  SUBJECTIVE feeling better, no further shocks, patient had multiple runs of V. tach so continued on amiodarone drip.  CHIEF COMPLAINT:   Chief Complaint  Patient presents with  . Shortness of Breath  Further defibrillator shocks, feeling better, slightly hypotensive this morning, hypoxic with oxygen saturation 92% on room air.   REVIEW OF SYSTEMS:   ROS CONSTITUTIONAL: No fever, fatigue or weakness.  EYES: No blurred or double vision.  EARS, NOSE, AND THROAT: No tinnitus or ear pain.  RESPIRATORY: No cough, shortness of breath, wheezing or hemoptysis.  CARDIOVASCULAR: No chest pain, orthopnea, edema.  GASTROINTESTINAL: No nausea, vomiting, diarrhea or abdominal pain.  GENITOURINARY: No dysuria, hematuria.  ENDOCRINE: No polyuria, nocturia,  HEMATOLOGY: No anemia, easy bruising or bleeding SKIN: No rash or lesion. MUSCULOSKELETAL: No joint pain or arthritis.   NEUROLOGIC: No tingling, numbness, weakness.  PSYCHIATRY: No anxiety or depression.   DRUG ALLERGIES:  No Known Allergies  VITALS:  Blood pressure 102/69, pulse 70, temperature (!) 97.5 F (36.4 C), temperature source Axillary, resp. rate 20, height 6\' 2"  (1.88 m), weight 123.2 kg, SpO2 92 %.  PHYSICAL EXAMINATION:  GENERAL:  78 y.o.-year-old patient lying in the bed with no acute distress.  EYES: Pupils equal, round, reactive to light and accommodation. No scleral icterus. Extraocular muscles intact.  HEENT: Head atraumatic, normocephalic. Oropharynx and nasopharynx clear.  NECK:  Supple, no jugular venous distention. No thyroid enlargement, no tenderness.  LUNGS: Normal breath sounds bilaterally, no wheezing, rales,rhonchi or crepitation. No use of accessory muscles of respiration.  CARDIOVASCULAR: S1, S2 normal. No murmurs, rubs, or gallops.  ABDOMEN: Soft, nontender,  nondistended. Bowel sounds present. No organomegaly or mass.  EXTREMITIES: No pedal edema, cyanosis, or clubbing.  NEUROLOGIC: Cranial nerves II through XII are intact. Muscle strength 5/5 in all extremities. Sensation intact. Gait not checked.  PSYCHIATRIC: The patient is alert and oriented x 3.  SKIN: No obvious rash, lesion, or ulcer.    LABORATORY PANEL:   CBC Recent Labs  Lab 08/10/19 0501  WBC 6.9  HGB 12.0*  HCT 36.4*  PLT 85*   ------------------------------------------------------------------------------------------------------------------  Chemistries  Recent Labs  Lab 08/09/19 2113  08/12/19 0614  NA 137   < > 134*  K 4.2   < > 3.5  CL 104   < > 101  CO2 20*   < > 22  GLUCOSE 197*   < > 113*  BUN 24*   < > 32*  CREATININE 1.77*   < > 1.40*  CALCIUM 9.4   < > 8.6*  MG  --    < > 2.1  AST 112*  --   --   ALT 32  --   --   ALKPHOS 66  --   --   BILITOT 1.9*  --   --    < > = values in this interval not displayed.   ------------------------------------------------------------------------------------------------------------------  Cardiac Enzymes No results for input(s): TROPONINI in the last 168 hours. ------------------------------------------------------------------------------------------------------------------  RADIOLOGY:  No results found.  EKG:   Orders placed or performed during the hospital encounter of 08/09/19  . EKG 12-Lead  . EKG 12-Lead    ASSESSMENT AND PLAN:   Sustained V. tach status post defibrillator discharges, transferred to ICU 2 days ago, on amiodarone drip, patient did not tolerate oral amiodarone trials yesterday continued  to have sustained V. tach so continued on IV amiodarone drip.  This morning he feels better, denies any chest pain.  No shortness of breath, BP slightly low, has some hypoxia but otherwise denies any complaints..   2.  Chronic systolic heart failure, ; on Entresto.,  Beta-blockers, uptitrated beta-blockers  as per cardiology recommendation secondary to his tachycardia. 3.  Acute kidney injury likely due to hypotension and ATN, improved, on Entresto 3.  Cryptogenic cirrhosis, hold hepatotoxins   All the records are reviewed and case discussed with Care Management/Social Workerr. Management plans discussed with the patient, family and they are in agreement.  CODE STATUS: Full code TOTAL TIME TAKING CARE OF THIS PATIENT: 35 minutes.   POSSIBLE D/C IN 1-2 DAYS, DEPENDING ON CLINICAL CONDITION. More than 50% time spent in counseling, coordination of care  Epifanio Lesches M.D on 08/12/2019 at 1:04 PM  Between 7am to 6pm - Pager - [phone]  After 6pm go to [email or web address] - password [email or web address] [place] [facility]  Office  [phone]  CC: Primary care physician; Crecencio Mc, MD   Note: This dictation was prepared with Dragon dictation along with smaller phrase technology. Any transcriptional errors that result from this process are unintentional.

## 2019-08-12 NOTE — Consult Note (Signed)
PHARMACY CONSULT NOTE - FOLLOW UP  Pharmacy Consult for Electrolyte Monitoring and Replacement   HPI: 78 y/o M presenting with SOB found to have sustained V tach now s/p multiple defibrillator shocks. Patient continues on amiodarone drip after failed oral amiodarone trial yesterday.   Recent Labs: Potassium (mmol/L)  Date Value  08/12/2019 3.5  03/10/2015 4.5   Magnesium (mg/dL)  Date Value  08/12/2019 2.1  11/23/2014 1.7 (L)   Calcium (mg/dL)  Date Value  08/12/2019 8.6 (L)   Calcium, Total (mg/dL)  Date Value  03/10/2015 9.0   Albumin (g/dL)  Date Value  08/09/2019 4.1  03/10/2015 4.1   Phosphorus (mg/dL)  Date Value  08/12/2019 3.6   Sodium (mmol/L)  Date Value  08/12/2019 134 (L)  03/10/2015 136    Assessment: Patient with extensive cardiac history including Afib, CHF, and implanted AICD now on amiodarone drip for sustained V tach. Recent electrolyte levels WNL however will target higher levels due to high risk for arrhythmia.  Goal of Therapy:  K of 4 Mg of 2  Plan:  Will give 40 mEq of oral potassium x1 today. Plan to re-check electrolyte levels on AM labs and replete as indicated.   [place] Resident 08/12/2019 1:43 PM

## 2019-08-12 NOTE — Progress Notes (Signed)
Pharmacy consult for electrolyte monitoring ordered per Dr Mortimer Fries, K 3.5

## 2019-08-12 NOTE — Progress Notes (Signed)
Sent Dr Clayborn Bigness a message regarding whether he wishes to stop amiodarone gtt, no reply

## 2019-08-12 NOTE — Progress Notes (Signed)
Report called to [facility] on 2A, pt moved on 2A bed.  He requested a bath before leaving ICU.  Small amount of blood noted to bed pad near rectum, pt reports he has hemorrhoids and felt he has been passing gas, [facility] notified.  All belongings, including cell phone, hearing aids, hearing aid charging station, phone charger, and extension cord, brought to room 258 with patient.  VSS on room air, amiodarone restarted on 2A pump

## 2019-08-13 DIAGNOSIS — J449 Chronic obstructive pulmonary disease, unspecified: Secondary | ICD-10-CM | POA: Diagnosis not present

## 2019-08-13 DIAGNOSIS — Z4502 Encounter for adjustment and management of automatic implantable cardiac defibrillator: Secondary | ICD-10-CM | POA: Diagnosis not present

## 2019-08-13 DIAGNOSIS — I472 Ventricular tachycardia: Secondary | ICD-10-CM | POA: Diagnosis not present

## 2019-08-13 DIAGNOSIS — I5022 Chronic systolic (congestive) heart failure: Secondary | ICD-10-CM | POA: Diagnosis not present

## 2019-08-13 LAB — BASIC METABOLIC PANEL
Anion gap: 10 (ref 5–15)
BUN: 48 mg/dL — ABNORMAL HIGH (ref 8–23)
CO2: 22 mmol/L (ref 22–32)
Calcium: 8.4 mg/dL — ABNORMAL LOW (ref 8.9–10.3)
Chloride: 101 mmol/L (ref 98–111)
Creatinine, Ser: 1.76 mg/dL — ABNORMAL HIGH (ref 0.61–1.24)
GFR calc Af Amer: 42 mL/min — ABNORMAL LOW (ref >60)
GFR calc non Af Amer: 36 mL/min — ABNORMAL LOW (ref >60)
Glucose, Bld: 106 mg/dL — ABNORMAL HIGH (ref 70–99)
Potassium: 3.6 mmol/L (ref 3.5–5.1)
Sodium: 133 mmol/L — ABNORMAL LOW (ref 135–145)

## 2019-08-13 LAB — MAGNESIUM: Magnesium: 2.1 mg/dL (ref 1.7–2.4)

## 2019-08-13 MED ORDER — AMIODARONE HCL 200 MG PO TABS
400.0000 mg | ORAL_TABLET | Freq: Two times a day (BID) | ORAL | Status: DC
  Administered 2019-08-13 – 2019-08-14 (×3): 400 mg via ORAL
  Filled 2019-08-13 (×3): qty 2

## 2019-08-13 NOTE — Consult Note (Signed)
PHARMACY CONSULT NOTE - FOLLOW UP  Pharmacy Consult for Electrolyte Monitoring and Replacement   HPI: 78 y/o M presenting with SOB found to have sustained V tach now s/p multiple defibrillator shocks. Patient continues on amiodarone drip after failed oral amiodarone trial yesterday.   Recent Labs: Potassium (mmol/L)  Date Value  08/13/2019 3.6  03/10/2015 4.5   Magnesium (mg/dL)  Date Value  08/13/2019 2.1  11/23/2014 1.7 (L)   Calcium (mg/dL)  Date Value  08/13/2019 8.4 (L)   Calcium, Total (mg/dL)  Date Value  03/10/2015 9.0   Albumin (g/dL)  Date Value  08/09/2019 4.1  03/10/2015 4.1   Phosphorus (mg/dL)  Date Value  08/12/2019 3.6   Sodium (mmol/L)  Date Value  08/13/2019 133 (L)  03/10/2015 136    Assessment: Patient with extensive cardiac history including Afib, CHF, and implanted AICD now on amiodarone drip for sustained V tach. Recent electrolyte levels WNL however will target higher levels due to high risk for arrhythmia.  Continuing to monitor kidney function: Scr 2.01-->1.38--1.76  Goal of Therapy:  K of 4 Mg of 2  Plan:  Potassium borderline low, but no replenishment warranted at this time per unstable renal function.   Plan to re-check electrolyte levels on AM labs and replete as indicated.   Lu Duffel, PharmD, BCPS Clinical Pharmacist 08/13/2019 7:42 AM

## 2019-08-13 NOTE — Care Management Important Message (Signed)
Important Message  Patient Details  Name: Rick Gilmore MRN: [id number] Date of Birth: [birth year]   Medicare Important Message Given:  Yes     Dannette Barbara 08/13/2019, 11:42 AM

## 2019-08-13 NOTE — Progress Notes (Signed)
[facility] [place] NAME: Rick Gilmore    MR#:  [id number]  DATE OF BIRTH:  [birth year]  SUBJECTIVE stop amiodarone drip earlier this morning, patient feeling better, no chest pain or shortness of breath.  Patient rate is around 80 bpm.  He says he is feeling better, did not sleep last night, would like to stay today and see how he does at home tomorrow.  Spoke with cardiologist Dr. Clayborn Bigness,..  CHIEF COMPLAINT:   Chief Complaint  Patient presents with  . Shortness of Breath  no shortness of breath, feels better. REVIEW OF SYSTEMS:   ROS CONSTITUTIONAL: No fever, fatigue or weakness.  EYES: No blurred or double vision.  EARS, NOSE, AND THROAT: No tinnitus or ear pain.  RESPIRATORY: No cough, shortness of breath, wheezing or hemoptysis.  CARDIOVASCULAR: No chest pain, orthopnea, edema.  GASTROINTESTINAL: No nausea, vomiting, diarrhea or abdominal pain.  GENITOURINARY: No dysuria, hematuria.  ENDOCRINE: No polyuria, nocturia,  HEMATOLOGY: No anemia, easy bruising or bleeding SKIN: No rash or lesion. MUSCULOSKELETAL: No joint pain or arthritis.   NEUROLOGIC: No tingling, numbness, weakness.  PSYCHIATRY: No anxiety or depression.   DRUG ALLERGIES:  No Known Allergies  VITALS:  Blood pressure (!) 104/59, pulse 71, temperature 97.9 F (36.6 C), temperature source Oral, resp. rate 20, height 6\' 2"  (1.88 m), weight 127.3 kg, SpO2 93 %.  PHYSICAL EXAMINATION:  GENERAL:  78 y.o.-year-old patient lying in the bed with no acute distress.  EYES: Pupils equal, round, reactive to light and accommodation. No scleral icterus. Extraocular muscles intact.  HEENT: Head atraumatic, normocephalic. Oropharynx and nasopharynx clear.  NECK:  Supple, no jugular venous distention. No thyroid enlargement, no tenderness.  LUNGS: Normal breath sounds bilaterally, no wheezing, rales,rhonchi or crepitation. No use of accessory muscles of respiration.   CARDIOVASCULAR: S1, S2 normal. No murmurs, rubs, or gallops.  ABDOMEN: Soft, nontender, nondistended. Bowel sounds present. No organomegaly or mass.  EXTREMITIES: No pedal edema, cyanosis, or clubbing.  NEUROLOGIC: Cranial nerves II through XII are intact. Muscle strength 5/5 in all extremities. Sensation intact. Gait not checked.  PSYCHIATRIC: The patient is alert and oriented x 3.  SKIN: No obvious rash, lesion, or ulcer.    LABORATORY PANEL:   CBC Recent Labs  Lab 08/10/19 0501  WBC 6.9  HGB 12.0*  HCT 36.4*  PLT 85*   ------------------------------------------------------------------------------------------------------------------  Chemistries  Recent Labs  Lab 08/09/19 2113  08/13/19 0454  NA 137   < > 133*  K 4.2   < > 3.6  CL 104   < > 101  CO2 20*   < > 22  GLUCOSE 197*   < > 106*  BUN 24*   < > 48*  CREATININE 1.77*   < > 1.76*  CALCIUM 9.4   < > 8.4*  MG  --    < > 2.1  AST 112*  --   --   ALT 32  --   --   ALKPHOS 66  --   --   BILITOT 1.9*  --   --    < > = values in this interval not displayed.   ------------------------------------------------------------------------------------------------------------------  Cardiac Enzymes No results for input(s): TROPONINI in the last 168 hours. ------------------------------------------------------------------------------------------------------------------  RADIOLOGY:  No results found.  EKG:   Orders placed or performed during the hospital encounter of 08/09/19  . EKG 12-Lead  . EKG 12-Lead    ASSESSMENT AND PLAN:   Sustained  V. tach status post defibrillator discharges, transferred to ICU 2 days ago, o on amiodarone drip for 3 days, stopped her last night, started on oral amiodarone at high dose at 400 mg p.o. twice daily, no chest pain or shortness of breath clinically feels better, possible discharge home tomorrow.  Discussed with cardiologist, patient wants to stay today. 2.  Chronic systolic heart  failure, ; on Entresto.,  Beta-blockers.   3.  Acute kidney injury likely due to hypotension and ATN, improved, on Entresto, .  Slight bump in the creatinine today .  3.  Cryptogenic cirrhosis, hold hepatotoxins  More than 50% time spent in counseling, coordination of care, spoke with Dr. Clayborn Bigness ,spoke with patient, All the records are reviewed and case discussed with Care Management/Social Workerr. Management plans discussed with the patient, family and they are in agreement.  CODE STATUS: Full code TOTAL TIME TAKING CARE OF THIS PATIENT: 35 minutes.   POSSIBLE D/C IN 1-2 DAYS, DEPENDING ON CLINICAL CONDITION. More than 50% time spent in counseling, coordination of care  Epifanio Lesches M.D on 08/13/2019 at 11:58 AM  Between 7am to 6pm - Pager - [phone]  After 6pm go to [email or web address] - password [email or web address]  Office  [phone]  CC: Primary care physician; Crecencio Mc, MD   Note: This dictation was prepared with Dragon dictation along with smaller phrase technology. Any transcriptional errors that result from this process are unintentional.

## 2019-08-14 LAB — BASIC METABOLIC PANEL
Anion gap: 11 (ref 5–15)
BUN: 48 mg/dL — ABNORMAL HIGH (ref 8–23)
CO2: 23 mmol/L (ref 22–32)
Calcium: 8.6 mg/dL — ABNORMAL LOW (ref 8.9–10.3)
Chloride: 101 mmol/L (ref 98–111)
Creatinine, Ser: 1.64 mg/dL — ABNORMAL HIGH (ref 0.61–1.24)
GFR calc Af Amer: 46 mL/min — ABNORMAL LOW (ref >60)
GFR calc non Af Amer: 40 mL/min — ABNORMAL LOW (ref >60)
Glucose, Bld: 105 mg/dL — ABNORMAL HIGH (ref 70–99)
Potassium: 3.6 mmol/L (ref 3.5–5.1)
Sodium: 135 mmol/L (ref 135–145)

## 2019-08-14 LAB — GLUCOSE, CAPILLARY: Glucose-Capillary: 157 mg/dL — ABNORMAL HIGH (ref 70–99)

## 2019-08-14 MED ORDER — POTASSIUM CHLORIDE CRYS ER 20 MEQ PO TBCR
40.0000 meq | EXTENDED_RELEASE_TABLET | Freq: Once | ORAL | Status: AC
  Administered 2019-08-14: 40 meq via ORAL
  Filled 2019-08-14: qty 2

## 2019-08-14 MED ORDER — AMIODARONE HCL 200 MG PO TABS: 200.0000 mg | ORAL_TABLET | Freq: Two times a day (BID) | ORAL | 0 refills | Status: DC

## 2019-08-14 MED ORDER — AMIODARONE HCL 200 MG PO TABS: 200.0000 mg | ORAL_TABLET | Freq: Every day | ORAL | 0 refills | Status: DC

## 2019-08-14 MED ORDER — METOPROLOL SUCCINATE ER 25 MG PO TB24: 75.0000 mg | ORAL_TABLET | Freq: Every day | ORAL | 9 refills | Status: DC

## 2019-08-14 NOTE — Consult Note (Signed)
PHARMACY CONSULT NOTE - FOLLOW UP  Pharmacy Consult for Electrolyte Monitoring and Replacement   HPI: 78 y/o M presenting with SOB found to have sustained V tach now s/p multiple defibrillator shocks. Patient continues on amiodarone drip after failed oral amiodarone trial yesterday.   Recent Labs: Potassium (mmol/L)  Date Value  08/14/2019 3.6  03/10/2015 4.5   Magnesium (mg/dL)  Date Value  08/13/2019 2.1  11/23/2014 1.7 (L)   Calcium (mg/dL)  Date Value  08/14/2019 8.6 (L)   Calcium, Total (mg/dL)  Date Value  03/10/2015 9.0   Albumin (g/dL)  Date Value  08/09/2019 4.1  03/10/2015 4.1   Phosphorus (mg/dL)  Date Value  08/12/2019 3.6   Sodium (mmol/L)  Date Value  08/14/2019 135  03/10/2015 136    Assessment: Patient with extensive cardiac history including Afib, CHF, and implanted AICD now on amiodarone drip for sustained V tach. Recent electrolyte levels WNL however will target higher levels due to high risk for arrhythmia.  Continuing to monitor kidney function: Scr 2.01-->1.38--1.76 > 1.64  Goal of Therapy:  K of 4 Mg of 2  Plan:  KCl 40 mEq PO x 1.   Plan to re-check electrolyte levels on AM labs and replete as indicated.   Oswald Hillock, PharmD, BCPS Clinical Pharmacist 08/14/2019 7:35 AM

## 2019-08-14 NOTE — Progress Notes (Signed)
Patient discharged to home. Tele and IV d/c'd. Patient verbalizes understanding of discharge instructions. 

## 2019-08-14 NOTE — Discharge Summary (Signed)
[name], 78 y.o., DOB 11-27-1941, MRN [id number]. Admission date: 08/09/2019 Discharge Date 08/14/2019 Primary MD Crecencio Mc, MD Admitting Physician Lance Coon, MD  Admission Diagnosis  Ventricular tachycardia [facility]) [I47.2]  Discharge Diagnosis   Principal Problem: Sustained V. tach Chronic systolic CHF Acute kidney injury due to ATN Cryptogenic cirrhosis Essential hypertension    Hospital Course  Rick Gilmore  is a 78 y.o. male who presents with chief complaint as above.  Patient presents the ED with a complaint of shortness of breath.  He states that he has been having some malaise as well.  He initially called EMS due to this malaise with an episode of diaphoresis.  EMS originally found him to be in A. fib and then sustained V. tach.  His rhythm then converted.  He does have a pacer defibrillator, but he states he did not feel like to go off.  However, it was interrogated here in the ED and does show 2 discharges.  Patient was admitted and started on amiodarone.  He was monitored throughout hospitalization was seen by cardiology who recommended tapering his amiodarone.  He will follow-up with his primary cardiologist to monitor for further episodes.           Consults  cardiology  Significant Tests:  See full reports for all details     Dg Chest Portable 1 View  Result Date: 08/09/2019 CLINICAL DATA:  Acute onset of shortness of breath, diaphoresis and nausea while at home earlier today. Current history of CHF. Prior CABG. EXAM: PORTABLE CHEST 1 VIEW COMPARISON:  12/26/2018 and earlier, including CT chest 10/07/2014. FINDINGS: Prior sternotomy for CABG. LEFT subclavian biventricular pacing defibrillator, unchanged. Cardiac silhouette moderately to markedly enlarged, unchanged. Pulmonary venous hypertension and minimal to mild interstitial pulmonary edema. Small RIGHT pleural effusion. No confluent airspace  consolidation. IMPRESSION: Minimal to mild CHF, with stable moderate to marked cardiomegaly and minimal to mild diffuse interstitial pulmonary edema. Small RIGHT pleural effusion. Electronically Signed   By: Evangeline Dakin M.D.   On: 08/09/2019 21:51       Today   Subjective:   Rick Gilmore patient denying any symptoms feeling well Objective:   Blood pressure (!) 102/52, pulse 72, temperature 97.8 F (36.6 C), temperature source Oral, resp. rate 20, height 6\' 2"  (1.88 m), weight 93.7 kg, SpO2 96 %.  .  Intake/Output Summary (Last 24 hours) at 08/14/2019 1508 Last data filed at 08/14/2019 1408 Gross per 24 hour  Intake 720 ml  Output 602 ml  Net 118 ml    Exam VITAL SIGNS: Blood pressure (!) 102/52, pulse 72, temperature 97.8 F (36.6 C), temperature source Oral, resp. rate 20, height 6\' 2"  (1.88 m), weight 93.7 kg, SpO2 96 %.  GENERAL:  78 y.o.-year-old patient lying in the bed with no acute distress.  EYES: Pupils equal, round, reactive to light and accommodation. No scleral icterus. Extraocular muscles intact.  HEENT: Head atraumatic, normocephalic. Oropharynx and nasopharynx clear.  NECK:  Supple, no jugular venous distention. No thyroid enlargement, no tenderness.  LUNGS: Normal breath sounds bilaterally, no wheezing, rales,rhonchi or crepitation. No use of accessory muscles of respiration.  CARDIOVASCULAR: S1, S2 normal. No murmurs, rubs, or gallops.  ABDOMEN: Soft, nontender, nondistended. Bowel sounds present. No organomegaly or mass.  EXTREMITIES: No pedal edema, cyanosis, or clubbing.  NEUROLOGIC: Cranial nerves II through XII are intact. Muscle strength 5/5 in all extremities. Sensation intact. Gait not checked.  PSYCHIATRIC: The  patient is alert and oriented x 3.  SKIN: No obvious rash, lesion, or ulcer.   Data Review     CBC w Diff:  Lab Results  Component Value Date   WBC 6.9 08/10/2019   HGB 12.0 (L) 08/10/2019   HGB 10.3 (L) 03/10/2015   HCT 36.4 (L)  08/10/2019   HCT 31.5 (L) 03/10/2015   PLT 85 (L) 08/10/2019   PLT 104 (L) 03/10/2015   LYMPHOPCT 5 08/09/2019   LYMPHOPCT 10.2 03/10/2015   MONOPCT 4 08/09/2019   MONOPCT 10.8 03/10/2015   EOSPCT 0 08/09/2019   EOSPCT 1.1 03/10/2015   BASOPCT 0 08/09/2019   BASOPCT 0.7 03/10/2015   CMP:  Lab Results  Component Value Date   NA 135 08/14/2019   NA 136 03/10/2015   K 3.6 08/14/2019   K 4.5 03/10/2015   CL 101 08/14/2019   CL 107 03/10/2015   CO2 23 08/14/2019   CO2 22 03/10/2015   BUN 48 (H) 08/14/2019   BUN 28 (H) 03/10/2015   CREATININE 1.64 (H) 08/14/2019   CREATININE 1.03 03/10/2015   CREATININE 0.84 11/28/2014   GLU 109 11/23/2014   PROT 7.1 08/09/2019   PROT 7.0 03/10/2015   ALBUMIN 4.1 08/09/2019   ALBUMIN 4.1 03/10/2015   BILITOT 1.9 (H) 08/09/2019   BILITOT 0.9 03/10/2015   ALKPHOS 66 08/09/2019   ALKPHOS 54 03/10/2015   AST 112 (H) 08/09/2019   AST 22 03/10/2015   ALT 32 08/09/2019   ALT 12 (L) 03/10/2015  .  Micro Results Recent Results (from the past 240 hour(s))  SARS Coronavirus 2 [facility] order, Performed in [facility] hospital lab) Nasopharyngeal Nasopharyngeal Swab     Status: None   Collection Time: 08/09/19  9:13 PM   Specimen: Nasopharyngeal Swab  Result Value Ref Range Status   SARS Coronavirus 2 NEGATIVE NEGATIVE Final    Comment: (NOTE) If result is NEGATIVE SARS-CoV-2 target nucleic acids are NOT DETECTED. The SARS-CoV-2 RNA is generally detectable in upper and lower  respiratory specimens during the acute phase of infection. The lowest  concentration of SARS-CoV-2 viral copies this assay can detect is 250  copies / mL. A negative result does not preclude SARS-CoV-2 infection  and should not be used as the sole basis for treatment or other  patient management decisions.  A negative result may occur with  improper specimen collection / handling, submission of specimen other  than nasopharyngeal swab, presence of viral mutation(s)  within the  areas targeted by this assay, and inadequate number of viral copies  (<250 copies / mL). A negative result must be combined with clinical  observations, patient history, and epidemiological information. If result is POSITIVE SARS-CoV-2 target nucleic acids are DETECTED. The SARS-CoV-2 RNA is generally detectable in upper and lower  respiratory specimens dur ing the acute phase of infection.  Positive  results are indicative of active infection with SARS-CoV-2.  Clinical  correlation with patient history and other diagnostic information is  necessary to determine patient infection status.  Positive results do  not rule out bacterial infection or co-infection with other viruses. If result is PRESUMPTIVE POSTIVE SARS-CoV-2 nucleic acids MAY BE PRESENT.   A presumptive positive result was obtained on the submitted specimen  and confirmed on repeat testing.  While 2019 novel coronavirus  (SARS-CoV-2) nucleic acids may be present in the submitted sample  additional confirmatory testing may be necessary for epidemiological  and / or clinical management purposes  to differentiate between  SARS-CoV-2 and other Sarbecovirus currently known to infect humans.  If clinically indicated additional testing with an alternate test  methodology [phone]) is advised. The SARS-CoV-2 RNA is generally  detectable in upper and lower respiratory sp ecimens during the acute  phase of infection. The expected result is Negative. Fact Sheet for Patients:  StrictlyIdeas.no Fact Sheet for Healthcare Providers: BankingDealers.co.za This test is not yet approved or cleared by the [place] FDA and has been authorized for detection and/or diagnosis of SARS-CoV-2 by FDA under an Emergency Use Authorization (EUA).  This EUA will remain in effect (meaning this test can be used) for the duration of the COVID-19 declaration under Section 564(b)(1) of the Act,  21 U.S.C. section 360bbb-3(b)(1), unless the authorization is terminated or revoked sooner. Performed at [facility], [address] [place], [place] [address]         Code Status Orders  (From admission, onward)         Start     Ordered   08/10/19 0054  Full code  Continuous     08/10/19 0053        Code Status History    Date Active Date Inactive Code Status Order ID Comments User Context   01/16/2016 1544 01/19/2016 1726 Full Code [id number]  Theodoro Grist, MD Inpatient   Advance Care Planning Activity    Advance Directive Documentation     Most Recent Value  Type of Advance Directive  Living will  Pre-existing out of facility DNR order (yellow form or pink MOST form)  -  "MOST" Form in Place?  -          Follow-up Information    Crecencio Mc, MD. Schedule an appointment as soon as possible for a visit in 6 days.   Specialty: Internal Medicine Why: PATIENT NEEDS TO MAKE APPOINTMENT  Contact information: [address] [place] [address] [phone]        Corey Skains, MD. Schedule an appointment as soon as possible for a visit in 1 week.   Specialty: Cardiology Why: v tach hosp f/u....Marland KitchenPATIENT NEEDS TO MAKE APPOINTMENT  Contact information: [address] [place] [facility] [place] [address] [phone]           Discharge Medications   Allergies as of 08/14/2019   No Known Allergies     Medication List    TAKE these medications   albuterol 108 (90 Base) MCG/ACT inhaler Commonly known as: VENTOLIN HFA Inhale 2 puffs into the lungs every 6 (six) hours as needed for wheezing or shortness of breath.   allopurinol 100 MG tablet Commonly known as: ZYLOPRIM Take 1 tablet (100 mg total) by mouth daily. Notes to patient: Resume home routine 08/15/2019, tomorrow   amiodarone 200 MG tablet Commonly known as: PACERONE Take 1 tablet (200 mg total) by mouth 2 (two) times daily for 14  days. Notes to patient: Next dose due 08/14/2019 (today) in the evening FOR 14 DAYS   amiodarone 200 MG tablet Commonly known as: Pacerone Take 1 tablet (200 mg total) by mouth daily. Start taking on: August 29, 2019 Notes to patient: DON'T START UNTIL 08/29/2019   Anoro Ellipta 62.5-25 MCG/INH Aepb Generic drug: umeclidinium-vilanterol USE 1 INHALATION DAILY What changed: See the new instructions. Notes to patient: Next dose due 08/15/2019 (tomorrow) in the am   colchicine 0.6 MG tablet Take 0.6 mg by mouth daily. Notes to patient: Resume home routine 08/15/2019 (tomorrow)   Cyanocobalamin 1000  MCG Subl Place 1 tablet (1,000 mcg total) under the tongue every morning. Notes to patient: Start on 08/15/2019 (tomorrow)   Eliquis 5 MG Tabs tablet Generic drug: apixaban Take 5 mg by mouth 2 (two) times daily. Notes to patient: Next dose due 08/14/2019 (today) in the evening   Entresto 24-26 MG Generic drug: sacubitril-valsartan TAKE 1 TABLET TWICE A DAY Notes to patient: Next dose due 08/14/2019 (today) in the evening   escitalopram 10 MG tablet Commonly known as: LEXAPRO Take 1 tablet (10 mg total) by mouth daily. Notes to patient: Next dose due 08/15/2019 (tomorrow) in the am   fluticasone 50 MCG/ACT nasal spray Commonly known as: FLONASE USE 1 SPRAY IN EACH NOSTRIL DAILY What changed: how much to take Notes to patient: Next dose due 08/15/2019 (tomorrow) in the am   isosorbide mononitrate 30 MG 24 hr tablet Commonly known as: IMDUR Take 30 mg by mouth daily. Notes to patient: Next dose due 08/15/2019 (tomorrow) in the am   metoprolol succinate 25 MG 24 hr tablet Commonly known as: TOPROL-XL Take 3 tablets (75 mg total) by mouth daily. Start taking on: August 15, 2019 What changed:   medication strength  See the new instructions. Notes to patient: Next dose due 08/15/2019 (tomorrow) in the am   montelukast 10 MG tablet Commonly known as: SINGULAIR Take 1 tablet (10 mg  total) by mouth at bedtime. Notes to patient: Next dose due 08/14/2019 (today) at bedtime   pantoprazole 40 MG tablet Commonly known as: PROTONIX Take 1 tablet (40 mg total) by mouth daily. Notes to patient: Next dose due 08/15/2019 (tomorrow) in the am   potassium chloride SA 20 MEQ tablet Commonly known as: K-DUR TAKE 1 TABLET DAILY Notes to patient: Next dose due 08/15/2019 (today) in the am   pravastatin 80 MG tablet Commonly known as: PRAVACHOL Take 80 mg by mouth daily. Notes to patient: Next dose due 08/14/2019 (today) at suppertime   spironolactone 25 MG tablet Commonly known as: ALDACTONE Take 25 mg by mouth daily. Notes to patient: Start 08/15/2019 (tomorrow) in the am   torsemide 20 MG tablet Commonly known as: DEMADEX TAKE 3 TABLETS IN THE MORNING AND 2 TABLETS IN THE EVENING What changed: See the new instructions. Notes to patient: Start 08/15/2019 (tomorrow) in the am          Total Time in preparing paper work, data evaluation and todays exam - 35 minutes  Dustin Flock M.D on 08/14/2019 at 3:08 PM [place]  [phone]

## 2019-08-14 NOTE — Progress Notes (Signed)
Patient script for second dosing of po amiodarone left in chart. Script faxed to patients pharmacy ([facility] on [address]). This RN spoke to patient and he is aware that this is the plan.

## 2019-08-15 ENCOUNTER — Telehealth: Payer: Self-pay | Admitting: Internal Medicine

## 2019-08-15 NOTE — Telephone Encounter (Signed)
Transition Care Management Follow-up Telephone Call  How have you been since you were released from the hospital? Patient said he is feeling much beter, but had question concerning new medication regimen, Patient currently taking Amiodarone 200 mg Tice daily is to continue this until 08/28/19 on 9/ 23/20 Per dischage paper work is to start Amiodarone 200 mg once daily, today patient was to start metoprolol Succinate 25 mg 24 hour tablet 3 tablets daily for total of 75 mg, patient voiced understanding by repeating dose and Identifying directions on DC summary.   Do you understand why you were in the hospital? yes   Do you understand the discharge instrcutions? yes  Items Reviewed:  Medications reviewed: yes  Allergies reviewed: yes  Dietary changes reviewed: yes  Referrals reviewed: yes   Functional Questionnaire:   Activities of Daily Living (ADLs):   He states they are independent in the following: ambulation, bathing and hygiene, feeding, continence, grooming, toileting and dressing States they require assistance with the following: expressed no needs at this time.   Any transportation issues/concerns?: yeNO    Any patient concerns? no   Confirmed importance and date/time of follow-up visits scheduled: yes   Confirmed with patient if condition begins to worsen call PCP or go to the ER.  Patient was given the Call-a-Nurse line [phone]: yes

## 2019-08-17 ENCOUNTER — Ambulatory Visit (INDEPENDENT_AMBULATORY_CARE_PROVIDER_SITE_OTHER): Payer: Medicare Other | Admitting: Internal Medicine

## 2019-08-17 ENCOUNTER — Other Ambulatory Visit: Payer: Self-pay

## 2019-08-17 ENCOUNTER — Encounter: Payer: Self-pay | Admitting: Internal Medicine

## 2019-08-17 VITALS — BP 104/60 | HR 89 | Temp 97.1°F | Resp 17 | Ht 74.0 in | Wt 272.0 lb

## 2019-08-17 DIAGNOSIS — R944 Abnormal results of kidney function studies: Secondary | ICD-10-CM | POA: Diagnosis not present

## 2019-08-17 DIAGNOSIS — I472 Ventricular tachycardia, unspecified: Secondary | ICD-10-CM

## 2019-08-17 DIAGNOSIS — D510 Vitamin B12 deficiency anemia due to intrinsic factor deficiency: Secondary | ICD-10-CM | POA: Diagnosis not present

## 2019-08-17 DIAGNOSIS — Z4502 Encounter for adjustment and management of automatic implantable cardiac defibrillator: Secondary | ICD-10-CM | POA: Diagnosis not present

## 2019-08-17 DIAGNOSIS — K766 Portal hypertension: Secondary | ICD-10-CM | POA: Diagnosis not present

## 2019-08-17 DIAGNOSIS — K7469 Other cirrhosis of liver: Secondary | ICD-10-CM | POA: Diagnosis not present

## 2019-08-17 DIAGNOSIS — R0602 Shortness of breath: Secondary | ICD-10-CM

## 2019-08-17 DIAGNOSIS — Z09 Encounter for follow-up examination after completed treatment for conditions other than malignant neoplasm: Secondary | ICD-10-CM | POA: Diagnosis not present

## 2019-08-17 DIAGNOSIS — R7303 Prediabetes: Secondary | ICD-10-CM

## 2019-08-17 DIAGNOSIS — D126 Benign neoplasm of colon, unspecified: Secondary | ICD-10-CM

## 2019-08-17 DIAGNOSIS — R5383 Other fatigue: Secondary | ICD-10-CM | POA: Diagnosis not present

## 2019-08-17 LAB — CBC WITH DIFFERENTIAL/PLATELET
Basophils Absolute: 0.1 10*3/uL (ref 0.0–0.1)
Basophils Relative: 0.6 % (ref 0.0–3.0)
Eosinophils Absolute: 0.1 10*3/uL (ref 0.0–0.7)
Eosinophils Relative: 0.8 % (ref 0.0–5.0)
HCT: 36.5 % — ABNORMAL LOW (ref 39.0–52.0)
Hemoglobin: 12.1 g/dL — ABNORMAL LOW (ref 13.0–17.0)
Lymphocytes Relative: 14 % (ref 12.0–46.0)
Lymphs Abs: 1.2 10*3/uL (ref 0.7–4.0)
MCHC: 33 g/dL (ref 30.0–36.0)
MCV: 96.7 fl (ref 78.0–100.0)
Monocytes Absolute: 0.8 10*3/uL (ref 0.1–1.0)
Monocytes Relative: 9.1 % (ref 3.0–12.0)
Neutro Abs: 6.4 10*3/uL (ref 1.4–7.7)
Neutrophils Relative %: 75.5 % (ref 43.0–77.0)
Platelets: 179 10*3/uL (ref 150.0–400.0)
RBC: 3.78 Mil/uL — ABNORMAL LOW (ref 4.22–5.81)
RDW: 15.2 % (ref 11.5–15.5)
WBC: 8.5 10*3/uL (ref 4.0–10.5)

## 2019-08-17 LAB — BASIC METABOLIC PANEL
BUN: 26 mg/dL — ABNORMAL HIGH (ref 6–23)
CO2: 26 mEq/L (ref 19–32)
Calcium: 9.1 mg/dL (ref 8.4–10.5)
Chloride: 104 mEq/L (ref 96–112)
Creatinine, Ser: 1.43 mg/dL (ref 0.40–1.50)
GFR: 47.84 mL/min — ABNORMAL LOW (ref >60.00)
Glucose, Bld: 95 mg/dL (ref 70–99)
Potassium: 4.7 mEq/L (ref 3.5–5.1)
Sodium: 140 mEq/L (ref 135–145)

## 2019-08-17 LAB — BRAIN NATRIURETIC PEPTIDE: Pro B Natriuretic peptide (BNP): 1124 pg/mL — ABNORMAL HIGH (ref 0.0–100.0)

## 2019-08-17 LAB — MAGNESIUM: Magnesium: 1.9 mg/dL (ref 1.5–2.5)

## 2019-08-17 LAB — TSH: TSH: 3.07 u[IU]/mL (ref 0.35–4.50)

## 2019-08-17 MED ORDER — METOLAZONE 2.5 MG PO TABS: 2.5000 mg | ORAL_TABLET | Freq: Two times a day (BID) | ORAL | 0 refills | Status: DC

## 2019-08-17 MED ORDER — ESCITALOPRAM OXALATE 10 MG PO TABS: 10.0000 mg | ORAL_TABLET | Freq: Every day | ORAL | 3 refills | Status: DC

## 2019-08-17 MED ORDER — POTASSIUM CHLORIDE CRYS ER 20 MEQ PO TBCR: 20.0000 meq | EXTENDED_RELEASE_TABLET | Freq: Every day | ORAL | 3 refills | Status: DC

## 2019-08-17 MED ORDER — MONTELUKAST SODIUM 10 MG PO TABS: 10.0000 mg | ORAL_TABLET | Freq: Every day | ORAL | 3 refills | Status: AC

## 2019-08-17 MED ORDER — ALLOPURINOL 100 MG PO TABS: 100.0000 mg | ORAL_TABLET | Freq: Every day | ORAL | 3 refills | Status: DC

## 2019-08-17 NOTE — Patient Instructions (Addendum)
I am adding metolazone 2.5 mg twice daily 30 mintues prior to your lasix doses,  To help lose the excess fluid you are retaining .  You can suspend it once you are back to your baseline weight    Metolazone tablets What is this medicine? METOLAZONE (me TOLE a zone) is a diuretic. It increases the amount of urine passed, which causes the body to lose salt and water. This medicine is used to treat high blood pressure. It is also reduces the swelling and water retention caused by heart or kidney disease. This medicine may be used for other purposes; ask your health care provider or pharmacist if you have questions. COMMON BRAND NAME(S): Mykrox, Zaroxolyn What should I tell my health care provider before I take this medicine? They need to know if you have any of these conditions:  diabetes  gout  immune system problems, like lupus  kidney disease  liver disease  pancreatitis  small amount of urine or difficulty passing urine  an unusual or allergic reaction to metolazone, sulfa drugs, other medicines, foods, dyes, or preservatives  pregnant or trying to get pregnant  breast-feeding How should I use this medicine? Take this medicine by mouth with a glass of water. Follow the directions on the prescription label. Remember that you will need to pass urine frequently after taking this medicine. Do not take your doses at a time of day that will cause you problems. Do not take at bedtime. Take your medicine at regular intervals. Do not take your medicine more often than directed. Do not stop taking except on your doctor's advice. Talk to your pediatrician regarding the use of this medicine in children. Special care may be needed. Overdosage: If you think you have taken too much of this medicine contact a poison control center or emergency room at once. NOTE: This medicine is only for you. Do not share this medicine with others. What if I miss a dose? If you miss a dose, take it as soon as  you can. If it is almost time for your next dose, take only that dose. Do not take double or extra doses. What may interact with this medicine?  alcohol  antiinflammatory drugs for pain or swelling  barbiturates for sleep or seizure control  digoxin  dofetilide  lithium  medicines for blood sugar  medicines for high blood pressure  medicines that relax muscles for surgery  methenamine  other diuretics  some medicines for pain  steroid hormones like cortisone, hydrocortisone, and prednisone  warfarin This list may not describe all possible interactions. Give your health care provider a list of all the medicines, herbs, non-prescription drugs, or dietary supplements you use. Also tell them if you smoke, drink alcohol, or use illegal drugs. Some items may interact with your medicine. What should I watch for while using this medicine? Visit your doctor or health care professional for regular checks on your progress. Check your blood pressure as directed. Ask your doctor or health care professional what your blood pressure should be and when you should contact him or her. You may need to be on a special diet while taking this medicine. Ask your doctor. Check with your doctor or health care professional if you get an attack of severe diarrhea, nausea and vomiting, or if you sweat a lot. The loss of too much body fluid can make it dangerous for you to take this medicine. You may get drowsy or dizzy. Do not drive, use machinery, or do  anything that needs mental alertness until you know how this medicine affects you. Do not stand or sit up quickly, especially if you are an older patient. This reduces the risk of dizzy or fainting spells. Alcohol may interfere with the effect of this medicine. Avoid alcoholic drinks. This medicine may affect your blood sugar level. If you have diabetes, check with your doctor or health care professional before changing the dose of your diabetic  medicine. This medicine can make you more sensitive to the sun. Keep out of the sun. If you cannot avoid being in the sun, wear protective clothing and use sunscreen. Do not use sun lamps or tanning beds/booths. What side effects may I notice from receiving this medicine? Side effects that you should report to your doctor or health care professional as soon as possible:  allergic reactions such as skin rash or itching, hives, swelling of the lips, mouth, tongue, or throat  fast or irregular heartbeat, chest pain  feeling faint  fever, chills  gout pain  hot red lump on leg  muscle pain, cramps  nausea, vomiting  numbness or tingling in hands, feet  pain or difficulty when passing urine  redness, blistering, peeling or loosening of the skin, including inside the mouth  unusual bleeding or bruising  unusually weak or tired  yellowing of the eyes, skin Side effects that usually do not require medical attention (report to your doctor or health care professional if they continue or are bothersome):  abdominal pain  blurred vision  constipation or diarrhea  dry mouth  headache This list may not describe all possible side effects. Call your doctor for medical advice about side effects. You may report side effects to FDA at 1-800-FDA-1088. Where should I keep my medicine? Keep out of the reach of children. Store at room temperature between 15 and 30 degrees C (59 and 86 degrees F). Protect from light. Keep container tightly closed. Throw away any unused medicine after the expiration date. NOTE: This sheet is a summary. It may not cover all possible information. If you have questions about this medicine, talk to your doctor, pharmacist, or health care provider.  2020 Elsevier/Gold Standard (2008-06-10 14:11:48)

## 2019-08-17 NOTE — Progress Notes (Signed)
Subjective:  Patient ID: Rick Gilmore, male    DOB: Aug 05, 1941  Age: 78 y.o. MRN: [id number]  CC: The primary encounter diagnosis was Pernicious anemia. 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  HPI Rick Gilmore presents for hospital follow up .  He is accompanied by his daughter today.  He was admitted to [facility] on Sept 3 after having calling EMS  due to suddenly feeling malaised and diaphoretic.  He was found to be in sustained V tach with a rate of 200 by EMS.  Converted back to a fib by time he was placed in the [place] so he was not treated,  But taken directly to [facility].  In the ER his ICD was interrogated and had discharged twice.  He was  admitted to regular telemetry bed and started on amiodarone.  On Sept 4 the  AICD fired four times in the same afternoon. He was then transferred to [facility] for amiodarone drip.  ..  Potassium and magnesium normal   COVID 19 negative . chest x ray noted  Cardiomegaly,  Mild diffuse interstitial pulmonary edema and small right pleural effusion.   Amiodarone drip was transitioned to oral amiodarone and he was discharge on Sept 8 with a higher dose of metoprolol and amiodarone as new medication.  His Amiodarone dose is to be reduced to 200 mg  once daily on sept 23.   Patient concerned that the reduction in metoprolol dose from 100 mg to 50 mg on July 13 was the precipitant.    He has gained 5 lbs since he was admitted based on home readings despite taking 100 mg torsemide  daily in divided doses.  Lab Results  Component Value Date   TSH 3.07 08/17/2019    Discharged sept 8     Outpatient Medications Prior to Visit  Medication Sig Dispense Refill  . albuterol (PROVENTIL HFA;VENTOLIN HFA) 108 (90 Base) MCG/ACT  inhaler Inhale 2 puffs into the lungs every 6 (six) hours as needed for wheezing or shortness of breath. 1 Inhaler 1  . amiodarone (PACERONE) 200 MG tablet Take 1 tablet (200 mg total) by mouth 2 (two) times daily for 14 days. 28 tablet 0  . [START ON 08/29/2019] amiodarone (PACERONE) 200 MG tablet Take 1 tablet (200 mg total) by mouth daily. 30 tablet 0  . ANORO ELLIPTA 62.5-25 MCG/INH AEPB USE 1 INHALATION DAILY (Patient taking differently: Inhale 1 puff into the lungs daily. ) 60 each 0  . apixaban (ELIQUIS) 5 MG TABS tablet Take 5 mg by mouth 2 (two) times daily.     . colchicine 0.6 MG tablet Take 0.6 mg by mouth daily.    . Cyanocobalamin 1000 MCG SUBL Place 1 tablet (1,000 mcg total) under the tongue every morning. 90 tablet 3  . ENTRESTO 24-26 MG TAKE 1 TABLET TWICE A DAY (Patient taking differently: Take 1 tablet by mouth 2 (two) times daily. ) 180 tablet 3  . fluticasone (FLONASE) 50 MCG/ACT nasal spray USE 1 SPRAY IN EACH NOSTRIL DAILY (Patient taking differently: Place 2 sprays into both nostrils daily. ) 48 g 3  . isosorbide mononitrate (IMDUR) 30 MG 24 hr tablet Take 30 mg by mouth daily.  11  . metoprolol succinate (TOPROL-XL) 25 MG 24 hr tablet Take 3 tablets (75 mg total) by  mouth daily. 30 tablet 9  . pantoprazole (PROTONIX) 40 MG tablet Take 1 tablet (40 mg total) by mouth daily. 90 tablet 3  . pravastatin (PRAVACHOL) 80 MG tablet Take 80 mg by mouth daily.    Marland Kitchen spironolactone (ALDACTONE) 25 MG tablet Take 25 mg by mouth daily.    Marland Kitchen torsemide (DEMADEX) 20 MG tablet TAKE 3 TABLETS IN THE MORNING AND 2 TABLETS IN THE EVENING (Patient taking differently: Take 40-60 mg by mouth 2 (two) times daily. 3 tablets (60mg ) in the morning and 2 tablets (40mg ) in the evening) 450 tablet 3  . allopurinol (ZYLOPRIM) 100 MG tablet Take 1 tablet (100 mg total) by mouth daily. 90 tablet 3  . escitalopram (LEXAPRO) 10 MG tablet Take 1 tablet (10 mg total) by mouth daily. 90 tablet 3  . montelukast  (SINGULAIR) 10 MG tablet Take 1 tablet (10 mg total) by mouth at bedtime. 90 tablet 3  . potassium chloride SA (K-DUR,KLOR-CON) 20 MEQ tablet TAKE 1 TABLET DAILY (Patient taking differently: Take 20 mEq by mouth daily. ) 90 tablet 1   No facility-administered medications prior to visit.     Review of Systems;  Patient denies headache, fevers, malaise, unintentional weight loss, skin rash, eye pain, sinus congestion and sinus pain, sore throat, dysphagia,  hemoptysis , cough, dyspnea, wheezing, chest pain, palpitations, orthopnea, edema, abdominal pain, nausea, melena, diarrhea, constipation, flank pain, dysuria, hematuria, urinary  Frequency, nocturia, numbness, tingling, seizures,  Focal weakness, Loss of consciousness,  Tremor, insomnia, depression, anxiety, and suicidal ideation.      Objective:  BP 104/60 (BP Location: Left Arm, Patient Position: Sitting, Cuff Size: Normal)   Pulse 89   Temp (!) 97.1 F (36.2 C) (Temporal)   Resp 17   Ht 6\' 2"  (1.88 m)   Wt 272 lb (123.4 kg)   SpO2 94%   BMI 34.92 kg/m   BP Readings from Last 3 Encounters:  08/17/19 104/60  08/14/19 (!) 102/52  07/16/19 (!) 91/53    Wt Readings from Last 3 Encounters:  08/17/19 272 lb (123.4 kg)  08/14/19 206 lb 8 oz (93.7 kg)  08/06/19 265 lb (120.2 kg)    General appearance: alert, cooperative and appears stated age Ears: normal TM's and external ear canals both ears Throat: lips, mucosa, and tongue normal; teeth and gums normal Neck: no adenopathy, no carotid bruit, supple, symmetrical, trachea midline and thyroid not enlarged, symmetric, no tenderness/mass/nodules Back: symmetric, no curvature. ROM normal. No CVA tenderness. Lungs: bilateral basilar rales.  Heart: regular rate and rhythm, S1, S2 normal, no murmur, click, rub or gallop Abdomen: soft, non-tender; bowel sounds normal; no masses,  no organomegaly. Ascites suggested  Pulses: 2+ and symmetric Skin: Skin color, texture, turgor normal. No  rashes or lesions. 1+ pitting edema  Lymph nodes: Cervical, supraclavicular, and axillary nodes normal.  Lab Results  Component Value Date   HGBA1C 5.9 05/28/2019   HGBA1C 5.7 06/10/2017   HGBA1C 5.1 01/16/2016    Lab Results  Component Value Date   CREATININE 1.43 08/17/2019   CREATININE 1.64 (H) 08/14/2019   CREATININE 1.76 (H) 08/13/2019    Lab Results  Component Value Date   WBC 8.5 08/17/2019   HGB 12.1 (L) 08/17/2019   HCT 36.5 (L) 08/17/2019   PLT 179.0 08/17/2019   GLUCOSE 95 08/17/2019   CHOL 147 05/28/2019   TRIG 102.0 05/28/2019   HDL 38.50 (L) 05/28/2019   LDLDIRECT 59.9 11/05/2011   LDLCALC 89 05/28/2019   ALT  32 08/09/2019   AST 112 (H) 08/09/2019   NA 140 08/17/2019   K 4.7 08/17/2019   CL 104 08/17/2019   CREATININE 1.43 08/17/2019   BUN 26 (H) 08/17/2019   CO2 26 08/17/2019   TSH 3.07 08/17/2019   INR 2.3 06/17/2014   HGBA1C 5.9 05/28/2019    Dg Chest Portable 1 View  Result Date: 08/09/2019 CLINICAL DATA:  Acute onset of shortness of breath, diaphoresis and nausea while at home earlier today. Current history of CHF. Prior CABG. EXAM: PORTABLE CHEST 1 VIEW COMPARISON:  12/26/2018 and earlier, including CT chest 10/07/2014. FINDINGS: Prior sternotomy for CABG. LEFT subclavian biventricular pacing defibrillator, unchanged. Cardiac silhouette moderately to markedly enlarged, unchanged. Pulmonary venous hypertension and minimal to mild interstitial pulmonary edema. Small RIGHT pleural effusion. No confluent airspace consolidation. IMPRESSION: Minimal to mild CHF, with stable moderate to marked cardiomegaly and minimal to mild diffuse interstitial pulmonary edema. Small RIGHT pleural effusion. Electronically Signed   By: Evangeline Dakin M.D.   On: 08/09/2019 21:51    Assessment & Plan:   Problem List Items Addressed This Visit      Unprioritized   Pernicious anemia - Primary   Relevant Orders   CBC with Differential/Platelet (Completed)   Prediabetes    V tach ([place])   Relevant Medications   metolazone (ZAROXOLYN) 2.5 MG tablet   Other Relevant Orders   Magnesium (Completed)   Tubular adenoma of colon    Recurrent by august 2020 colonoscopy. 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 He is advised to abstain from alcohol       Defibrillator discharge    He had 2 asymptomatic discharges on Sept 3 per ED evaluation and 4 discharges Sept 4 during admission which were symptomatic .  Continue amiodarone and metoprolol       Hospital discharge follow-up    Patient is stable post discharge from [facility] on Sept 8.  All ongoing issues and  questions about his discharge plans were answered and all imaging studies and tests were reviewed with patient .   He was reminded to reduce the dose of amiodarone to once daily  Continue the higher dose of metoprolol (75 mg) and start Zaroxolyn for augmentation of already elevated dose of torsemide given signs of fluid retention on exam,  likely due to abdominal wall edema from cirrhosis         Other Visit Diagnoses    Decreased calculated GFR       Relevant Orders   Basic metabolic panel (Completed)   Fatigue, unspecified type       Relevant Orders   TSH (Completed)   Short of breath on exertion       Relevant Orders   B Nat Peptide (Completed)      I have changed Aceton Radoncic's potassium chloride SA. I am also having him start on metolazone. Additionally, I am having him maintain his isosorbide mononitrate, Cyanocobalamin, fluticasone, torsemide, albuterol,  pantoprazole, Anoro Ellipta, Entresto, apixaban,  colchicine, spironolactone, pravastatin, metoprolol succinate, amiodarone, amiodarone, allopurinol, montelukast, and escitalopram.  Meds ordered this encounter  Medications  . allopurinol (ZYLOPRIM) 100 MG tablet    Sig: Take 1 tablet (100 mg total) by mouth daily.    Dispense:  90 tablet    Refill:  3  . montelukast (SINGULAIR) 10 MG tablet    Sig: Take 1 tablet (10 mg total) by mouth at bedtime.    Dispense:  90 tablet    Refill:  3  . escitalopram (LEXAPRO) 10 MG tablet    Sig: Take 1 tablet (10 mg total) by mouth daily.    Dispense:  90 tablet    Refill:  3  . potassium chloride SA (K-DUR) 20 MEQ tablet    Sig: Take 1 tablet (20 mEq total) by mouth daily.    Dispense:  90 tablet    Refill:  3  . metolazone (ZAROXOLYN) 2.5 MG tablet    Sig: Take 1 tablet (2.5 mg total) by mouth 2 (two) times daily.    Dispense:  60 tablet    Refill:  0    Medications Discontinued During This Encounter  Medication Reason  . potassium chloride SA (K-DUR,KLOR-CON) 20 MEQ tablet Reorder  . allopurinol (ZYLOPRIM) 100 MG tablet Reorder  . montelukast (SINGULAIR) 10 MG tablet Reorder  . escitalopram (LEXAPRO) 10 MG tablet Reorder    Follow-up: No follow-ups on file.   Crecencio Mc, MD

## 2019-08-19 DIAGNOSIS — Z09 Encounter for follow-up examination after completed treatment for conditions other than malignant neoplasm: Secondary | ICD-10-CM | POA: Insufficient documentation

## 2019-08-19 NOTE — Assessment & Plan Note (Signed)
Recurrent by august 2020 colonoscopy. Repeat colonoscpy at age 78 may prove to be more risk than benefit given his subsequent hospitalization for vtach

## 2019-08-19 NOTE — Assessment & Plan Note (Signed)
Patient is stable post discharge from [facility] on Sept 8.  All ongoing issues and  questions about his discharge plans were answered and all imaging studies and tests were reviewed with patient .   He was reminded to reduce the dose of amiodarone to once daily  Continue the higher dose of metoprolol (75 mg) and start Zaroxolyn for augmentation of already elevated dose of torsemide given signs of fluid retention on exam,  likely due to abdominal wall edema from cirrhosis

## 2019-08-19 NOTE — Assessment & Plan Note (Addendum)
He had 2 asymptomatic discharges on Sept 3 per ED evaluation and 4 discharges Sept 4 during admission which were symptomatic .  Continue amiodarone and metoprolol

## 2019-08-19 NOTE — Assessment & Plan Note (Signed)
He has had the first dose of vaccine for  Hepatitis A and B. He is advised to abstain from alcohol

## 2019-08-19 NOTE — Assessment & Plan Note (Addendum)
Starting  [facility] for augmentation of already elevated dose of torsemide given signs of fluid retention on exam,  likely due to abdominal wall edema from cirrhosis

## 2019-08-20 ENCOUNTER — Ambulatory Visit: Payer: Medicare Other

## 2019-08-21 ENCOUNTER — Encounter: Payer: Self-pay | Admitting: Emergency Medicine

## 2019-08-21 ENCOUNTER — Ambulatory Visit (HOSPITAL_COMMUNITY)
Admission: AD | Admit: 2019-08-21 | Discharge: 2019-08-21 | Disposition: A | Discharge status: Home / Self Care | Payer: Medicare Other | Source: Other Acute Inpatient Hospital | Attending: Emergency Medicine | Admitting: Emergency Medicine

## 2019-08-21 ENCOUNTER — Emergency Department
Admission: EM | Admit: 2019-08-21 | Discharge: 2019-08-22 | Disposition: A | Discharge status: Other Acute Inpatient Hospital | Payer: Medicare Other | Attending: Emergency Medicine | Admitting: Emergency Medicine

## 2019-08-21 ENCOUNTER — Other Ambulatory Visit: Payer: Self-pay

## 2019-08-21 ENCOUNTER — Emergency Department: Payer: Medicare Other

## 2019-08-21 DIAGNOSIS — N179 Acute kidney failure, unspecified: Secondary | ICD-10-CM | POA: Diagnosis not present

## 2019-08-21 DIAGNOSIS — I472 Ventricular tachycardia, unspecified: Secondary | ICD-10-CM

## 2019-08-21 DIAGNOSIS — R0602 Shortness of breath: Secondary | ICD-10-CM | POA: Diagnosis present

## 2019-08-21 DIAGNOSIS — I252 Old myocardial infarction: Secondary | ICD-10-CM | POA: Diagnosis not present

## 2019-08-21 DIAGNOSIS — Z20828 Contact with and (suspected) exposure to other viral communicable diseases: Secondary | ICD-10-CM | POA: Diagnosis not present

## 2019-08-21 DIAGNOSIS — Z79899 Other long term (current) drug therapy: Secondary | ICD-10-CM | POA: Insufficient documentation

## 2019-08-21 DIAGNOSIS — Z951 Presence of aortocoronary bypass graft: Secondary | ICD-10-CM | POA: Insufficient documentation

## 2019-08-21 DIAGNOSIS — I110 Hypertensive heart disease with heart failure: Secondary | ICD-10-CM | POA: Insufficient documentation

## 2019-08-21 DIAGNOSIS — J449 Chronic obstructive pulmonary disease, unspecified: Secondary | ICD-10-CM | POA: Diagnosis not present

## 2019-08-21 DIAGNOSIS — I2510 Atherosclerotic heart disease of native coronary artery without angina pectoris: Secondary | ICD-10-CM | POA: Diagnosis not present

## 2019-08-21 DIAGNOSIS — Z7901 Long term (current) use of anticoagulants: Secondary | ICD-10-CM | POA: Diagnosis not present

## 2019-08-21 DIAGNOSIS — I5022 Chronic systolic (congestive) heart failure: Secondary | ICD-10-CM | POA: Diagnosis not present

## 2019-08-21 DIAGNOSIS — Z95810 Presence of automatic (implantable) cardiac defibrillator: Secondary | ICD-10-CM | POA: Diagnosis not present

## 2019-08-21 DIAGNOSIS — R0989 Other specified symptoms and signs involving the circulatory and respiratory systems: Secondary | ICD-10-CM | POA: Diagnosis not present

## 2019-08-21 DIAGNOSIS — I4821 Permanent atrial fibrillation: Secondary | ICD-10-CM | POA: Diagnosis not present

## 2019-08-21 LAB — CBC WITH DIFFERENTIAL/PLATELET
Abs Immature Granulocytes: 0.11 10*3/uL — ABNORMAL HIGH (ref 0.00–0.07)
Basophils Absolute: 0.1 10*3/uL (ref 0.0–0.1)
Basophils Relative: 0 %
Eosinophils Absolute: 0 10*3/uL (ref 0.0–0.5)
Eosinophils Relative: 0 %
HCT: 40.3 % (ref 39.0–52.0)
Hemoglobin: 13.5 g/dL (ref 13.0–17.0)
Immature Granulocytes: 1 %
Lymphocytes Relative: 14 %
Lymphs Abs: 1.8 10*3/uL (ref 0.7–4.0)
MCH: 31.4 pg (ref 26.0–34.0)
MCHC: 33.5 g/dL (ref 30.0–36.0)
MCV: 93.7 fL (ref 80.0–100.0)
Monocytes Absolute: 0.8 10*3/uL (ref 0.1–1.0)
Monocytes Relative: 6 %
Neutro Abs: 10.5 10*3/uL — ABNORMAL HIGH (ref 1.7–7.7)
Neutrophils Relative %: 79 %
Platelets: 235 10*3/uL (ref 150–400)
RBC: 4.3 MIL/uL (ref 4.22–5.81)
RDW: 14.6 % (ref 11.5–15.5)
WBC: 13.3 10*3/uL — ABNORMAL HIGH (ref 4.0–10.5)
nRBC: 0 % (ref 0.0–0.2)

## 2019-08-21 LAB — PROTIME-INR
INR: 1.8 — ABNORMAL HIGH (ref 0.8–1.2)
Prothrombin Time: 20.4 seconds — ABNORMAL HIGH (ref 11.4–15.2)

## 2019-08-21 LAB — SARS CORONAVIRUS 2 BY RT PCR (HOSPITAL ORDER, PERFORMED IN ~~LOC~~ HOSPITAL LAB): SARS Coronavirus 2: NEGATIVE

## 2019-08-21 LAB — COMPREHENSIVE METABOLIC PANEL
ALT: 11 U/L (ref 0–44)
AST: 25 U/L (ref 15–41)
Albumin: 4.4 g/dL (ref 3.5–5.0)
Alkaline Phosphatase: 65 U/L (ref 38–126)
Anion gap: 15 (ref 5–15)
BUN: 35 mg/dL — ABNORMAL HIGH (ref 8–23)
CO2: 26 mmol/L (ref 22–32)
Calcium: 9.5 mg/dL (ref 8.9–10.3)
Chloride: 94 mmol/L — ABNORMAL LOW (ref 98–111)
Creatinine, Ser: 2.62 mg/dL — ABNORMAL HIGH (ref 0.61–1.24)
GFR calc Af Amer: 26 mL/min — ABNORMAL LOW (ref >60)
GFR calc non Af Amer: 23 mL/min — ABNORMAL LOW (ref >60)
Glucose, Bld: 141 mg/dL — ABNORMAL HIGH (ref 70–99)
Potassium: 4.4 mmol/L (ref 3.5–5.1)
Sodium: 135 mmol/L (ref 135–145)
Total Bilirubin: 1.1 mg/dL (ref 0.3–1.2)
Total Protein: 7.8 g/dL (ref 6.5–8.1)

## 2019-08-21 LAB — TROPONIN I (HIGH SENSITIVITY)
Troponin I (High Sensitivity): 1039 ng/L (ref <18)
Troponin I (High Sensitivity): 1543 ng/L (ref <18)

## 2019-08-21 MED ORDER — AMIODARONE IV BOLUS ONLY 150 MG/100ML
INTRAVENOUS | Status: AC
  Filled 2019-08-21: qty 100

## 2019-08-21 MED ORDER — AMIODARONE HCL IN DEXTROSE 360-4.14 MG/200ML-% IV SOLN
INTRAVENOUS | Status: AC
  Filled 2019-08-21: qty 200

## 2019-08-21 MED ORDER — HEPARIN SODIUM (PORCINE) 1000 UNIT/ML IJ SOLN
INTRAMUSCULAR | Status: AC
  Filled 2019-08-21: qty 1

## 2019-08-21 MED ORDER — ETOMIDATE 2 MG/ML IV SOLN
20.0000 mg | Freq: Once | INTRAVENOUS | Status: AC
  Administered 2019-08-21: 16:00:00 20 mg via INTRAVENOUS

## 2019-08-21 MED ORDER — TICAGRELOR 90 MG PO TABS
ORAL_TABLET | ORAL | Status: AC
  Filled 2019-08-21: qty 2

## 2019-08-21 MED ORDER — SODIUM CHLORIDE 0.9 % IV BOLUS
500.0000 mL | Freq: Once | INTRAVENOUS | Status: AC
  Administered 2019-08-21: 16:00:00 500 mL via INTRAVENOUS

## 2019-08-21 MED ORDER — ASPIRIN 81 MG PO CHEW
324.0000 mg | CHEWABLE_TABLET | Freq: Once | ORAL | Status: AC
  Administered 2019-08-21: 16:00:00 324 mg via ORAL

## 2019-08-21 MED ORDER — AMIODARONE HCL IN DEXTROSE 360-4.14 MG/200ML-% IV SOLN
30.0000 mg/h | INTRAVENOUS | Status: DC
  Administered 2019-08-21: 30 mg/h via INTRAVENOUS
  Filled 2019-08-21: qty 200

## 2019-08-21 MED ORDER — FENTANYL CITRATE (PF) 100 MCG/2ML IJ SOLN
INTRAMUSCULAR | Status: AC
  Filled 2019-08-21: qty 2

## 2019-08-21 MED ORDER — AMIODARONE IV BOLUS ONLY 150 MG/100ML
150.0000 mg | Freq: Once | INTRAVENOUS | Status: AC
  Administered 2019-08-21: 16:00:00 150 mg via INTRAVENOUS

## 2019-08-21 MED ORDER — HEPARIN (PORCINE) IN NACL 1000-0.9 UT/500ML-% IV SOLN
INTRAVENOUS | Status: AC
  Filled 2019-08-21: qty 1000

## 2019-08-21 MED ORDER — MIDAZOLAM HCL 2 MG/2ML IJ SOLN
INTRAMUSCULAR | Status: AC
  Filled 2019-08-21: qty 2

## 2019-08-21 MED ORDER — VERAPAMIL HCL 2.5 MG/ML IV SOLN
INTRAVENOUS | Status: AC
  Filled 2019-08-21: qty 2

## 2019-08-21 MED ORDER — AMIODARONE HCL IN DEXTROSE 360-4.14 MG/200ML-% IV SOLN
60.0000 mg/h | INTRAVENOUS | Status: AC
  Administered 2019-08-21: 60 mg/h via INTRAVENOUS

## 2019-08-21 MED ORDER — SODIUM CHLORIDE 0.9 % IV BOLUS
500.0000 mL | Freq: Once | INTRAVENOUS | Status: AC
  Administered 2019-08-21: 22:00:00 500 mL via INTRAVENOUS

## 2019-08-21 MED ORDER — ETOMIDATE 2 MG/ML IV SOLN
INTRAVENOUS | Status: AC
  Filled 2019-08-21: qty 10

## 2019-08-21 MED ORDER — NITROGLYCERIN 1 MG/10 ML FOR IR/CATH LAB
INTRA_ARTERIAL | Status: AC
  Filled 2019-08-21: qty 10

## 2019-08-21 NOTE — ED Notes (Signed)
defib pads placed on pt

## 2019-08-21 NOTE — Progress Notes (Signed)
Ch responded to code STEMI regarding pt. Code was cancelled at 1603

## 2019-08-21 NOTE — ED Notes (Signed)
Courtney from [place] transfer center and said patient is accepted, they received approval but will call back when they have a bed assignment  [phone]

## 2019-08-21 NOTE — ED Notes (Signed)
Family remains at bedside. Pt waiting on [facility] transfer. No pain. Unlabored. Remains on zoll.  No other needs at this time.

## 2019-08-21 NOTE — ED Triage Notes (Signed)
Pt felt like heart was racing and went in to have pacer/defib checked and was found to be in vtach. Sent to ED from pacer clinic. Pt alert and oriented, no chest pain.

## 2019-08-21 NOTE — ED Notes (Signed)
CODE STEMI  CALLED  TO  DOUG  AT  [facility]  PER  DR  Joni Fears  MD

## 2019-08-21 NOTE — Progress Notes (Signed)
[facility] STEMI Note  Date: 08/21/19 Time: 4:31 PM  I was contacted by Dr. Joni Fears regarding Rick Gilmore' EKG.  He has a history of recurrent VT and was sent from [facility] to the ED due to recurrent VT.  He was intubated and cardioverted in the ED.  I have presonally reviewed his post-cardioversion EKG, which shows a ventricular paced rhythm (ventricular pacing precludes accurate assessment for STEMI).  Per Dr. Joni Fears, the patient complained of shortness of breath but no chest pain prior to intubation.  Given indeterminate EKG and lack of chest pain with history of recurrent VT, as well as comorbidities such as acute on chronic kidney failure and chronic anticoagulation, I have recommended against emergent cardiac catheterization at this time.  I advised Dr. Joni Fears to contact Dr. Clayborn Bigness, the patient's primary cardiologist for further recommendations.  Nelva Bush, MD [facility] Pager: [phone]

## 2019-08-21 NOTE — ED Notes (Signed)
[facility]  CODE  STEMI   PER  DR  STAFFORD  MD INFORMED  DOUG  AT  [facility]

## 2019-08-21 NOTE — ED Notes (Signed)
Courtney from [place] transfer center called and said patient is accepted, they received approval but will call back when they have a bed assignment  [phone]

## 2019-08-21 NOTE — ED Provider Notes (Signed)
[facility] Emergency Department Provider Note  ____________________________________________  Time seen: Approximately 3:55 PM  I have reviewed the triage vital signs and the nursing notes.   HISTORY  Chief Complaint VTACH    HPI Rick Gilmore is a 78 y.o. male with a history of atrial fibrillation on apixaban, CHF with an EF of 30%, COPD, hypertension, ischemic cardiomyopathy, status post CABG, status post [employer] AICD inserted in 2014 at [facility]  who was sent to the ED from cardiology clinic due to ventricular tachycardia.  Patient reports he was in his usual state of health until last night when he had a sudden onset of shortness of breath, worse with exertion better with rest.  Denies any pain.  No vomiting or diaphoresis or other associated symptoms, no palpitations dizziness or syncope.  Dr. Clayborn Bigness from cardiology clinic advises the patient most likely needs to be transferred to [facility] for electrophysiology evaluation.  He has contacted them but not received a call back yet to be able to establish a plan.  Electronic medical record reviewed including notes from the past several years at [facility] given the complexity of the patient's care.   No recent trauma, no recent illness.  He was hospitalized September 3 to September 8 due to ventricular tachycardia which was stabilized with an amiodarone infusion and transition to oral amiodarone.   Past Medical History:  Diagnosis Date  . AICD (automatic cardioverter/defibrillator) present   . Anemia   . Arrhythmia    a fib  . Atrial fibrillation ([place])    on Coumadin  . CAD (coronary artery disease)   . Cancer [facility]) 2001   Prostate, XRT and implant  . CHF (congestive heart failure) ([place])   . COPD (chronic obstructive pulmonary disease) ([place])   . Erectile dysfunction   . GERD (gastroesophageal reflux disease)   . Hyperlipidemia   . Hypertension   . Ischemic cardiomyopathy    s/p AICD/pacer 2009, replaced 2010  [facility]  . MI (myocardial infarction) ([place]) 1988  . Personal history of prostate cancer 2001   s/p XRT and implant ([place])  . Prostate cancer ([place])   . Sleep apnea, obstructive    uses CPAP nightly  . Thrombocytopenia ([place])   . Tubular adenoma    colon polyp  . Vitamin B 12 deficiency      Patient Active Problem List   Diagnosis Date Noted  . Hospital discharge follow-up 08/19/2019  . V tach ([place]) 08/09/2019  . Defibrillator discharge 08/09/2019  . V-tach ([place]) 08/09/2019  . 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 Obesity 04/20/2016  . Hearing loss of left ear due to cerumen impaction 04/19/2016  . Hypotension 03/10/2016  . Allergic rhinitis 02/02/2016  . Chronic renal insufficiency 01/16/2016  . Prediabetes 01/16/2016  . 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 S/P implantation of automatic cardioverter/defibrillator (AICD) 10/21/2015  . Diverticulosis of colon without hemorrhage 10/21/2015  . Internal hemorrhoids 10/21/2015  . Atrial fibrillation ([place]) 10/15/2015  . Tubular adenoma of colon 06/04/2015  . COPD (chronic obstructive pulmonary disease) ([place]) 04/20/2015  . Iron deficiency anemia 04/13/2015  . Thrombocytopenia ([place]) 12/01/2014  . Hyperlipidemia LDL goal <70 12/01/2014  . Chronic systolic heart failure ([place]) 10/08/2014  . Long term current use of anticoagulant therapy 11/02/2011  . Pernicious anemia 11/02/2011  . Ischemic cardiomyopathy   . Sleep apnea, obstructive      Past Surgical History:  Procedure Laterality  Date  . CARDIAC CATHETERIZATION    . COLONOSCOPY WITH PROPOFOL [name] 05/30/2015   Procedure: COLONOSCOPY WITH PROPOFOL;  Surgeon: Manya Silvas, MD;  Location: [facility] ENDOSCOPY;  Service: Endoscopy;  Laterality: [name];  . COLONOSCOPY WITH PROPOFOL [name] 07/16/2019   Procedure: COLONOSCOPY WITH PROPOFOL;  Surgeon: Toledo, Benay Pike, MD;  Location: [facility]  ENDOSCOPY;  Service: Gastroenterology;  Laterality: [name];  . CORONARY ARTERY BYPASS GRAFT  1988  . ESOPHAGOGASTRODUODENOSCOPY [name] 05/30/2015   Procedure: ESOPHAGOGASTRODUODENOSCOPY (EGD);  Surgeon: Manya Silvas, MD;  Location: [facility] ENDOSCOPY;  Service: Endoscopy;  Laterality: [name];  . ESOPHAGOGASTRODUODENOSCOPY (EGD) WITH PROPOFOL [name] 07/16/2019   Procedure: ESOPHAGOGASTRODUODENOSCOPY (EGD) WITH PROPOFOL;  Surgeon: Toledo, Benay Pike, MD;  Location: [facility] ENDOSCOPY;  Service: Gastroenterology;  Laterality: [name];  . IMPLANTABLE CARDIOVERTER DEFIBRILLATOR GENERATOR CHANGE  April 2014   Bi V RRR  . INSERT / REPLACE / REMOVE PACEMAKER  2014  . VASECTOMY       Prior to Admission medications   Medication Sig Start Date End Date Taking? Authorizing Provider  albuterol (PROVENTIL HFA;VENTOLIN HFA) 108 (90 Base) MCG/ACT inhaler Inhale 2 puffs into the lungs every 6 (six) hours as needed for wheezing or shortness of breath. 12/26/18  Yes Guse, Jacquelynn Cree, FNP  allopurinol (ZYLOPRIM) 100 MG tablet Take 1 tablet (100 mg total) by mouth daily. 08/17/19  Yes Crecencio Mc, MD  amiodarone (PACERONE) 200 MG tablet Take 1 tablet (200 mg total) by mouth 2 (two) times daily for 14 days. 08/14/19 08/28/19 Yes Dustin Flock, MD  ANORO ELLIPTA 62.5-25 MCG/INH AEPB USE 1 INHALATION DAILY Patient taking differently: Inhale 1 puff into the lungs daily.  06/12/19  Yes Wilhelmina Mcardle, MD  apixaban (ELIQUIS) 5 MG TABS tablet Take 5 mg by mouth 2 (two) times daily.  06/12/19  Yes [provider]  colchicine 0.6 MG tablet Take 0.6 mg by mouth daily.   Yes [provider]  Cyanocobalamin 1000 MCG SUBL Place 1 tablet (1,000 mcg total) under the tongue every morning. 06/15/17  Yes Crecencio Mc, MD  ENTRESTO 24-26 MG TAKE 1 TABLET TWICE A DAY Patient taking differently: Take 1 tablet by mouth 2 (two) times daily.  06/12/19  Yes Crecencio Mc, MD  escitalopram (LEXAPRO) 10 MG tablet Take 1 tablet (10 mg total) by  mouth daily. 08/17/19  Yes Crecencio Mc, MD  fluticasone (FLONASE) 50 MCG/ACT nasal spray USE 1 SPRAY IN EACH NOSTRIL DAILY Patient taking differently: Place 2 sprays into both nostrils daily.  05/15/18  Yes Wilhelmina Mcardle, MD  isosorbide mononitrate (IMDUR) 30 MG 24 hr tablet Take 30 mg by mouth daily.   Yes [provider]  metolazone (ZAROXOLYN) 2.5 MG tablet Take 1 tablet (2.5 mg total) by mouth 2 (two) times daily. 08/17/19  Yes Crecencio Mc, MD  metoprolol succinate (TOPROL-XL) 25 MG 24 hr tablet Take 3 tablets (75 mg total) by mouth daily. 08/15/19  Yes Dustin Flock, MD  montelukast (SINGULAIR) 10 MG tablet Take 1 tablet (10 mg total) by mouth at bedtime. 08/17/19  Yes Crecencio Mc, MD  pantoprazole (PROTONIX) 40 MG tablet Take 1 tablet (40 mg total) by mouth daily. 05/10/19  Yes Crecencio Mc, MD  potassium chloride SA (K-DUR) 20 MEQ tablet Take 1 tablet (20 mEq total) by mouth daily. 08/17/19  Yes Crecencio Mc, MD  pravastatin (PRAVACHOL) 80 MG tablet Take 80 mg by mouth daily.   Yes [provider]  spironolactone (ALDACTONE)  25 MG tablet Take 25 mg by mouth daily.   Yes [provider]  torsemide (DEMADEX) 20 MG tablet TAKE 3 TABLETS IN THE MORNING AND 2 TABLETS IN THE EVENING Patient taking differently: Take 40-60 mg by mouth 2 (two) times daily. 3 tablets (60mg ) in the morning and 2 tablets (40mg ) in the afternoon 06/29/18  Yes Darylene Price A, FNP  amiodarone (PACERONE) 200 MG tablet Take 1 tablet (200 mg total) by mouth daily. 08/29/19   Dustin Flock, MD     Allergies Patient has no known allergies.   Family History  Problem Relation Age of Onset  . Hypertension Mother   . Hypertension Father   . Cancer Father        prostate, throat  . Emphysema Father   . Heart disease Maternal Grandmother   . Heart disease Paternal Grandmother     Social History Social History   Tobacco Use  . Smoking status: Never Smoker  . Smokeless  tobacco: Never Used  Substance Use Topics  . Alcohol use: Yes    Alcohol/week: 4.0 standard drinks    Types: 2 Standard drinks or equivalent, 2 Cans of beer per week    Comment: Mixed drinks  . Drug use: No    Review of Systems  Constitutional:   No fever or chills.  ENT:   No sore throat. No rhinorrhea. Cardiovascular:   No chest pain or syncope. Respiratory:   Positive dyspnea on exertion without cough. Gastrointestinal:   Negative for abdominal pain, vomiting and diarrhea.  Musculoskeletal:   Negative for focal pain or swelling All other systems reviewed and are negative except as documented above in ROS and HPI.  ____________________________________________   PHYSICAL EXAM:  VITAL SIGNS: ED Triage Vitals  Enc Vitals Group     BP 08/21/19 1531 (!) 80/66     Pulse Rate 08/21/19 1531 (!) 168     Resp 08/21/19 1531 20     Temp 08/21/19 1531 97.6 F (36.4 C)     Temp Source 08/21/19 1531 Oral     SpO2 08/21/19 1531 95 %     Weight 08/21/19 1525 272 lb (123.4 kg)     Height 08/21/19 1525 6\' 2"  (1.88 m)     Head Circumference --      Peak Flow --      Pain Score 08/21/19 1525 0     Pain Loc --      Pain Edu? --      Excl. in [place]? --     Vital signs reviewed, nursing assessments reviewed.   Constitutional:   Alert and oriented. Non-toxic appearance. Eyes:   Conjunctivae are normal. EOMI. PERRL. ENT      Head:   Normocephalic and atraumatic.      Nose:   No congestion/rhinnorhea.       Mouth/Throat:   MMM, no pharyngeal erythema. No peritonsillar mass.       Neck:   No meningismus. Full ROM. Hematological/Lymphatic/Immunilogical:   No cervical lymphadenopathy. Cardiovascular:   Tachycardia, heart rate 170. Symmetric bilateral radial pulses, thready dorsalis pedis pulses with delayed capillary refill.   Respiratory:   Normal respiratory effort without tachypnea/retractions. Breath sounds are clear and equal bilaterally. No wheezes/rales/rhonchi. Gastrointestinal:    Soft and nontender. Non distended. There is no CVA tenderness.  No rebound, rigidity, or guarding.  Musculoskeletal:   Normal range of motion in all extremities. No joint effusions.  No lower extremity tenderness.  No edema. Neurologic:  Normal speech and language.  Motor grossly intact. No acute focal neurologic deficits are appreciated.  Skin:    Skin is warm, dry and intact. No rash noted.  No petechiae, purpura, or bullae.  ____________________________________________    LABS (pertinent positives/negatives) (all labs ordered are listed, but only abnormal results are displayed) Labs Reviewed  CBC WITH DIFFERENTIAL/PLATELET - Abnormal; Notable for the following components:      Result Value   WBC 13.3 (*)    Neutro Abs 10.5 (*)    Abs Immature Granulocytes 0.11 (*)    All other components within normal limits  COMPREHENSIVE METABOLIC PANEL - Abnormal; Notable for the following components:   Chloride 94 (*)    Glucose, Bld 141 (*)    BUN 35 (*)    Creatinine, Ser 2.62 (*)    GFR calc non Af Amer 23 (*)    GFR calc Af Amer 26 (*)    All other components within normal limits  PROTIME-INR - Abnormal; Notable for the following components:   Prothrombin Time 20.4 (*)    INR 1.8 (*)    All other components within normal limits  TROPONIN I (HIGH SENSITIVITY) - Abnormal; Notable for the following components:   Troponin I (High Sensitivity) 1,039 (*)    All other components within normal limits  TROPONIN I (HIGH SENSITIVITY) - Abnormal; Notable for the following components:   Troponin I (High Sensitivity) 1,543 (*)    All other components within normal limits   ____________________________________________   EKG  First EKG at 2:53 PM, interpreted by me Monomorphic ventricular tachycardia, rate of 168.  Right axis.    Second EKG at 3:25 PM interpreted by me Monomorphic ventricular tachycardia, rate of 168.  Third EKG at 3:42 PM, post cardioversion, interpreted by  me Ventricular paced rhythm, rate of 71.  Right axis, poor R wave progression, left bundle branch block.  Slight ST elevation in aVR with ST depression in the inferior and lateral leads concerning for STEMI  Fourth EKG at 3:48 PM with V5 and V6 and posterior lead position, interpreted by me Persistent 1 mm ST elevation in aVR with ST depression inferiorly and laterally, negative for posterior STEMI  ____________________________________________    RADIOLOGY  Dg Chest Portable 1 View  Result Date: 08/21/2019 CLINICAL DATA:  78 year old male with V-tach. EXAM: PORTABLE CHEST 1 VIEW COMPARISON:  Chest radiograph dated 08/09/2019 FINDINGS: There is moderate cardiomegaly with vascular congestion. No focal consolidation, pleural effusion, or pneumothorax. Left pectoral AICD device. Median sternotomy wires and CABG vascular clips. Atherosclerotic calcification of the aorta. No acute osseous pathology. IMPRESSION: Cardiomegaly with vascular congestion. No focal consolidation. Electronically Signed   By: Anner Crete M.D.   On: 08/21/2019 16:11    ____________________________________________   PROCEDURES .Critical Care Performed by: Carrie Mew, MD Authorized by: Carrie Mew, MD   Critical care provider statement:    Critical care time (minutes):  90   Critical care time was exclusive of:  Separately billable procedures and treating other patients   Critical care was necessary to treat or prevent imminent or life-threatening deterioration of the following conditions:  Cardiac failure and circulatory failure   Critical care was time spent personally by me on the following activities:  Development of treatment plan with patient or surrogate, discussions with consultants, evaluation of patient's response to treatment, examination of patient, obtaining history from patient or surrogate, ordering and performing treatments and interventions, ordering and review of laboratory studies,  ordering and review of  radiographic studies, pulse oximetry, re-evaluation of patient's condition and review of old charts Comments:         .Sedation  Date/Time: 08/21/2019 4:27 PM Performed by: Carrie Mew, MD Authorized by: Carrie Mew, MD   Consent:    Consent obtained:  Emergent situation and verbal (electronic informed consent)   Consent given by:  Patient   Risks discussed:  Allergic reaction, dysrhythmia, inadequate sedation, nausea, vomiting, respiratory compromise necessitating ventilatory assistance and intubation, prolonged sedation necessitating reversal and prolonged hypoxia resulting in organ damage   Alternatives discussed:  Analgesia without sedation Universal protocol:    Procedure explained and questions answered to patient or proxy's satisfaction: yes     Relevant documents present and verified: yes     Test results available and properly labeled: yes     Imaging studies available: yes     Required blood products, implants, devices, and special equipment available: yes     Immediately prior to procedure a time out was called: yes     Patient identity confirmation method:  Arm band Indications:    Procedure performed:  Cardioversion   Procedure necessitating sedation performed by:  Physician performing sedation Pre-sedation assessment:    Time since last food or drink:  "hours ago"   NPO status caution: urgency dictates proceeding with non-ideal NPO status     ASA classification: class 4 - patient with severe systemic disease that is a constant threat to life     Neck mobility: normal     Mouth opening:  3 or more finger widths   Thyromental distance:  4 finger widths   Mallampati score:  II - soft palate, uvula, fauces visible   Pre-sedation assessments completed and reviewed: airway patency, cardiovascular function, hydration status, mental status, nausea/vomiting, pain level, respiratory function and temperature     Pre-sedation assessment  completed:  08/21/2019 3:30 PM Immediate pre-procedure details:    Reassessment: Patient reassessed immediately prior to procedure     Reviewed: vital signs, relevant labs/tests and NPO status     Verified: bag valve mask available, emergency equipment available, intubation equipment available, IV patency confirmed, oxygen available, reversal medications available and suction available   Procedure details (see MAR for exact dosages):    Preoxygenation:  Nasal cannula   Sedation:  Etomidate   Analgesia:  None   Intra-procedure monitoring:  Blood pressure monitoring, continuous pulse oximetry, cardiac monitor, frequent vital sign checks and frequent LOC assessments   Intra-procedure events: none     Total Provider sedation time (minutes):  35 Post-procedure details:    Post-sedation assessment completed:  08/21/2019 4:05 PM   Attendance: Constant attendance by certified staff until patient recovered     Recovery: Patient returned to pre-procedure baseline     Post-sedation assessments completed and reviewed: airway patency, cardiovascular function, hydration status, mental status, nausea/vomiting, pain level and respiratory function     Patient is stable for discharge or admission: yes     Patient tolerance:  Tolerated well, no immediate complications Comments:     Verbal consent obtained for deep sedation and electrical cardioversion due to hypotension with ventricular tachycardia, at imminent risk of ventricular fibrillation and death.  Patient alert and able to provide verbal consent for the procedure understanding the dangerousness of the situation.   .Cardioversion  Date/Time: 08/21/2019 4:31 PM Performed by: Carrie Mew, MD Authorized by: Carrie Mew, MD   Consent:    Consent obtained:  Verbal and emergent situation   Consent given by:  Patient  Risks discussed:  Pain, induced arrhythmia and death   Alternatives discussed:  Rate-control medication Universal protocol:     Procedure explained and questions answered to patient or proxy's satisfaction: yes     Relevant documents present and verified: yes     Test results available and properly labeled: yes     Required blood products, implants, devices, and special equipment available: yes     Immediately prior to procedure a time out was called: yes     Patient identity confirmed:  Verbally with patient Pre-procedure details:    Cardioversion basis:  Emergent   Rhythm:  Ventricular tachycardia   Electrode placement:  Anterior-posterior Patient sedated: Yes. Refer to sedation procedure documentation for details of sedation.  Attempt one:    Cardioversion mode:  Synchronous   Waveform:  Biphasic   Shock (Joules):  200   Shock outcome:  Conversion to other rhythm (Regular paced rhythm, rate 70) Post-procedure details:    Patient status:  Awake   Patient tolerance of procedure:  Tolerated well, no immediate complications Comments:     Immediate success with first attempt at synchronized cardioversion.  Magnet placed over pacemaker prior to cardioversion and removed afterward.  Pacemaker emitted warning alarm to confirm that it was inactivated during that time.      ____________________________________________  DIFFERENTIAL DIAGNOSIS   STEMI, non-STEMI, myocarditis, ectopic pacemaker, electrical feedback loop  CLINICAL IMPRESSION / ASSESSMENT AND PLAN / ED COURSE  Medications ordered in the ED: Medications  amiodarone (NEXTERONE PREMIX) 360-4.14 MG/200ML-% (1.8 mg/mL) IV infusion (60 mg/hr Intravenous New Bag/Given 08/21/19 1625)  amiodarone (NEXTERONE PREMIX) 360-4.14 MG/200ML-% (1.8 mg/mL) IV infusion (has no administration in time range)  etomidate (AMIDATE) injection 20 mg (20 mg Intravenous Given by Other 08/21/19 1540)  aspirin chewable tablet 324 mg (324 mg Oral Given 08/21/19 1601)  amiodarone (NEXTERONE) IV bolus only 150 mg/100 mL (0 mg Intravenous Stopped 08/21/19 1615)  sodium chloride  0.9 % bolus 500 mL (500 mLs Intravenous New Bag/Given 08/21/19 1620)  verapamil (ISOPTIN) 2.5 MG/ML injection (has no administration in time range)  midazolam (VERSED) 2 MG/2ML injection (has no administration in time range)  fentaNYL (SUBLIMAZE) 100 MCG/2ML injection (has no administration in time range)  ticagrelor (BRILINTA) 90 MG tablet (has no administration in time range)  heparin 1000 UNIT/ML injection (has no administration in time range)  nitroGLYCERIN 100 mcg/mL intra-arterial injection (has no administration in time range)  Heparin (Porcine) in NaCl 1000-0.9 UT/500ML-% SOLN (has no administration in time range)    Pertinent labs & imaging results that were available during my care of the patient were reviewed by me and considered in my medical decision making (see chart for details).  Rick Gilmore was evaluated in Emergency Department on 08/21/2019 for the symptoms described in the history of present illness. He was evaluated in the context of the global COVID-19 pandemic, which necessitated consideration that the patient might be at risk for infection with the SARS-CoV-2 virus that causes COVID-19. Institutional protocols and algorithms that pertain to the evaluation of patients at risk for COVID-19 are in a state of rapid change based on information released by regulatory bodies including the CDC and federal and state organizations. These policies and algorithms were followed during the patient's care in the ED.   Patient presents in ventricular tachycardia.  Symptoms of dyspnea on exertion.  On arrival he is lucid and denying chest pain but with V. tach and a rate of 170 and hypotension, was necessary to proceed  to emergent sedation and electrical cardioversion. Pt provided verbal consent with family present.   Clinical Course as of Aug 21 2007  Tue Aug 21, 2019  1553 STEMI activated due to elevation in aVR after cardioversion.     [PS]  84 D/w Dr. Saunders Revel who feels EKG not diagnostic  given paced rhythm, would cancel STEMI activation for now given absence of chest pain.    [PS]  7893 Pt fully awake. Case discussed with Dr. Clayborn Bigness who agrees that EKG changes are unlikely to be ischemic, would not emergently cath the patient right now, feels the patient needs transfer to tertiary care center for electrophysiology evaluation, possible ablation procedure or AICD change.   [PS]  8101 Pt asymptomatic at present, HR 70, BP 80/60, will give 547mL saline bolus. Awaiting callback from [facility] transfer center.    [PS]  1623 D/w [facility] transfer center Kandis Mannan who will contact attendings    [PS]  1740 VS remain stable. Case discussed with Dr. Glennon Mac of [facility] cardiology at 5:20pm who accepts for urgent transfer.     [PS]  7510 Update from [facility] is that they have a bed pending. They understand need to send their crit care transport asap, as EMS will be unable to transport pt on amio drip and he is high risk for dysrhythmia   [PS]    Clinical Course User Index [PS] Carrie Mew, MD     ----------------------------------------- 8:07 PM on 08/21/2019 -----------------------------------------  Pt remains stable on amio infusion. No new symptoms. Awaiting transport to [facility].  ____________________________________________   FINAL CLINICAL IMPRESSION(S) / ED DIAGNOSES    Final diagnoses:  Ventricular tachycardia ([place])  AKI (acute kidney injury) ([place])  Chronic systolic heart failure ([place])  Morbid obesity [facility])     ED Discharge Orders    None      Portions of this note were generated with dragon dictation software. Dictation errors may occur despite best attempts at proofreading.   Carrie Mew, MD 08/21/19 2008

## 2019-08-21 NOTE — ED Notes (Signed)
Pt remains on pads. Waiting on transfer to [place]. Remains chest pain free at this time. Will continue to monitor.

## 2019-08-21 NOTE — ED Notes (Signed)
Date and time results received: 08/21/19 1627 (use smartphrase ".now" to insert current time)  Test: troponin Critical Value: 1039  Name of Provider Notified: stafford

## 2019-08-21 NOTE — ED Notes (Signed)
Synchronized cardioversion at 200 joules.  Dr Joni Fears at bedside.  Magnet placed over pts pacer prior to shock by dr stafford.

## 2019-08-21 NOTE — ED Notes (Signed)
[facility]  TRANSF[facility]  CALLED  PER  DR  Joni Fears  MD

## 2019-08-22 DIAGNOSIS — R002 Palpitations: Secondary | ICD-10-CM | POA: Diagnosis not present

## 2019-08-22 DIAGNOSIS — K7581 Nonalcoholic steatohepatitis (NASH): Secondary | ICD-10-CM | POA: Diagnosis not present

## 2019-08-22 DIAGNOSIS — J9811 Atelectasis: Secondary | ICD-10-CM | POA: Diagnosis not present

## 2019-08-22 DIAGNOSIS — I509 Heart failure, unspecified: Secondary | ICD-10-CM | POA: Diagnosis not present

## 2019-08-22 DIAGNOSIS — Z951 Presence of aortocoronary bypass graft: Secondary | ICD-10-CM | POA: Diagnosis not present

## 2019-08-22 DIAGNOSIS — N179 Acute kidney failure, unspecified: Secondary | ICD-10-CM | POA: Diagnosis not present

## 2019-08-22 DIAGNOSIS — Z8546 Personal history of malignant neoplasm of prostate: Secondary | ICD-10-CM | POA: Diagnosis not present

## 2019-08-22 DIAGNOSIS — N170 Acute kidney failure with tubular necrosis: Secondary | ICD-10-CM | POA: Diagnosis present

## 2019-08-22 DIAGNOSIS — I081 Rheumatic disorders of both mitral and tricuspid valves: Secondary | ICD-10-CM | POA: Diagnosis present

## 2019-08-22 DIAGNOSIS — I2510 Atherosclerotic heart disease of native coronary artery without angina pectoris: Secondary | ICD-10-CM | POA: Diagnosis not present

## 2019-08-22 DIAGNOSIS — I110 Hypertensive heart disease with heart failure: Secondary | ICD-10-CM | POA: Diagnosis not present

## 2019-08-22 DIAGNOSIS — K219 Gastro-esophageal reflux disease without esophagitis: Secondary | ICD-10-CM | POA: Diagnosis not present

## 2019-08-22 DIAGNOSIS — D510 Vitamin B12 deficiency anemia due to intrinsic factor deficiency: Secondary | ICD-10-CM | POA: Diagnosis not present

## 2019-08-22 DIAGNOSIS — I4510 Unspecified right bundle-branch block: Secondary | ICD-10-CM | POA: Diagnosis not present

## 2019-08-22 DIAGNOSIS — I5022 Chronic systolic (congestive) heart failure: Secondary | ICD-10-CM | POA: Diagnosis not present

## 2019-08-22 DIAGNOSIS — I255 Ischemic cardiomyopathy: Secondary | ICD-10-CM | POA: Diagnosis not present

## 2019-08-22 DIAGNOSIS — I10 Essential (primary) hypertension: Secondary | ICD-10-CM | POA: Diagnosis not present

## 2019-08-22 DIAGNOSIS — M109 Gout, unspecified: Secondary | ICD-10-CM | POA: Diagnosis present

## 2019-08-22 DIAGNOSIS — K766 Portal hypertension: Secondary | ICD-10-CM | POA: Diagnosis present

## 2019-08-22 DIAGNOSIS — Z20828 Contact with and (suspected) exposure to other viral communicable diseases: Secondary | ICD-10-CM | POA: Diagnosis present

## 2019-08-22 DIAGNOSIS — K7460 Unspecified cirrhosis of liver: Secondary | ICD-10-CM | POA: Diagnosis present

## 2019-08-22 DIAGNOSIS — K7469 Other cirrhosis of liver: Secondary | ICD-10-CM | POA: Diagnosis not present

## 2019-08-22 DIAGNOSIS — I4821 Permanent atrial fibrillation: Secondary | ICD-10-CM | POA: Diagnosis not present

## 2019-08-22 DIAGNOSIS — E782 Mixed hyperlipidemia: Secondary | ICD-10-CM | POA: Diagnosis present

## 2019-08-22 DIAGNOSIS — Z95810 Presence of automatic (implantable) cardiac defibrillator: Secondary | ICD-10-CM | POA: Diagnosis not present

## 2019-08-22 DIAGNOSIS — I959 Hypotension, unspecified: Secondary | ICD-10-CM | POA: Diagnosis present

## 2019-08-22 DIAGNOSIS — I472 Ventricular tachycardia: Secondary | ICD-10-CM | POA: Diagnosis not present

## 2019-08-22 DIAGNOSIS — Z7901 Long term (current) use of anticoagulants: Secondary | ICD-10-CM | POA: Diagnosis not present

## 2019-08-22 DIAGNOSIS — K3189 Other diseases of stomach and duodenum: Secondary | ICD-10-CM | POA: Diagnosis present

## 2019-08-22 DIAGNOSIS — I25810 Atherosclerosis of coronary artery bypass graft(s) without angina pectoris: Secondary | ICD-10-CM | POA: Diagnosis not present

## 2019-08-22 DIAGNOSIS — I4430 Unspecified atrioventricular block: Secondary | ICD-10-CM | POA: Diagnosis present

## 2019-08-22 DIAGNOSIS — J449 Chronic obstructive pulmonary disease, unspecified: Secondary | ICD-10-CM | POA: Diagnosis not present

## 2019-08-22 DIAGNOSIS — G4733 Obstructive sleep apnea (adult) (pediatric): Secondary | ICD-10-CM | POA: Diagnosis not present

## 2019-08-22 DIAGNOSIS — N189 Chronic kidney disease, unspecified: Secondary | ICD-10-CM | POA: Diagnosis not present

## 2019-08-22 DIAGNOSIS — I130 Hypertensive heart and chronic kidney disease with heart failure and stage 1 through stage 4 chronic kidney disease, or unspecified chronic kidney disease: Secondary | ICD-10-CM | POA: Diagnosis present

## 2019-08-24 MED ORDER — ARMOUR THYROID 30 MG PO TABS
2.00 | ORAL_TABLET | ORAL | Status: DC
Start: ? — End: 2019-08-24

## 2019-08-24 MED ORDER — Medication
60.00 | Status: DC
Start: 2019-08-24 — End: 2019-08-24

## 2019-08-24 MED ORDER — ESCITALOPRAM OXALATE 10 MG PO TABS
10.00 | ORAL_TABLET | ORAL | Status: DC
Start: 2019-08-31 — End: 2019-08-24

## 2019-08-24 MED ORDER — KERLONE 10 MG PO TABS
1.00 | ORAL_TABLET | ORAL | Status: DC
Start: 2019-08-31 — End: 2019-08-24

## 2019-08-24 MED ORDER — RIBAVIRIN-INTERFERON ALFA-2B 1200/3000000 UNIT/0.5ML CO KIT
80.00 | PACK | Status: DC
Start: 2019-08-30 — End: 2019-08-24

## 2019-08-24 MED ORDER — MORPHINE SULFATE ER 15 MG PO TBCR
1100.00 | EXTENDED_RELEASE_TABLET | ORAL | Status: DC
Start: ? — End: 2019-08-24

## 2019-08-24 MED ORDER — Medication
40.00 | Status: DC
Start: 2019-08-24 — End: 2019-08-24

## 2019-08-24 MED ORDER — PANTOPRAZOLE SODIUM 40 MG PO TBEC
40.00 | DELAYED_RELEASE_TABLET | ORAL | Status: DC
Start: 2019-08-31 — End: 2019-08-24

## 2019-08-24 MED ORDER — X-5 THERAPEUTIC EX
2.00 | CUTANEOUS | Status: DC
Start: 2019-08-31 — End: 2019-08-24

## 2019-08-24 MED ORDER — AMIODARONE HCL 200 MG PO TABS
400.00 | ORAL_TABLET | ORAL | Status: DC
Start: 2019-08-31 — End: 2019-08-24

## 2019-08-24 MED ORDER — Medication
4.00 | Status: DC
Start: 2019-08-30 — End: 2019-08-24

## 2019-08-24 MED ORDER — Medication
50.00 | Status: DC
Start: 2019-08-24 — End: 2019-08-24

## 2019-08-24 MED ORDER — ALBA-3 EX
400.00 | CUTANEOUS | Status: DC
Start: 2019-08-30 — End: 2019-08-24

## 2019-08-24 MED ORDER — TUSSI PRES-B 2-15-200 MG/5ML PO LIQD
0.50 | ORAL | Status: DC
Start: ? — End: 2019-08-24

## 2019-08-24 MED ORDER — GENERIC EXTERNAL MEDICATION
1000.00 | Status: DC
Start: 2019-08-31 — End: 2019-08-24

## 2019-08-24 MED ORDER — ALLOPURINOL 100 MG PO TABS
50.00 | ORAL_TABLET | ORAL | Status: DC
Start: 2019-09-01 — End: 2019-08-24

## 2019-08-30 ENCOUNTER — Ambulatory Visit: Payer: Medicare Other

## 2019-08-30 MED ORDER — TUSSI PRES-B 2-15-200 MG/5ML PO LIQD
0.50 | ORAL | Status: DC
Start: ? — End: 2019-08-30

## 2019-08-30 MED ORDER — Medication
40.00 | Status: DC
Start: ? — End: 2019-08-30

## 2019-08-30 MED ORDER — WEIGHT LOSS DAILY MULTI PO TABS
5.00 | ORAL_TABLET | ORAL | Status: DC
Start: 2019-08-30 — End: 2019-08-30

## 2019-08-30 MED ORDER — TORSEMIDE 20 MG PO TABS
60.00 | ORAL_TABLET | ORAL | Status: DC
Start: 2019-08-31 — End: 2019-08-30

## 2019-08-30 MED ORDER — VALSARTAN 80 MG PO TABS
40.00 | ORAL_TABLET | ORAL | Status: DC
Start: 2019-08-31 — End: 2019-08-30

## 2019-08-30 MED ORDER — SPIRONOLACTONE 25 MG PO TABS
25.00 | ORAL_TABLET | ORAL | Status: DC
Start: 2019-08-31 — End: 2019-08-30

## 2019-08-30 MED ORDER — QUINERVA 260 MG PO TABS
650.00 | ORAL_TABLET | ORAL | Status: DC
Start: ? — End: 2019-08-30

## 2019-08-30 MED ORDER — CALCIUM-VITAMIN D-VITAMIN K 600-1000-90 MG-UNT-MCG PO TABS
25.00 | ORAL_TABLET | ORAL | Status: DC
Start: ? — End: 2019-08-30

## 2019-08-30 MED ORDER — Medication
50.00 | Status: DC
Start: 2019-08-31 — End: 2019-08-30

## 2019-08-31 ENCOUNTER — Telehealth: Payer: Self-pay | Admitting: Internal Medicine

## 2019-08-31 DIAGNOSIS — N189 Chronic kidney disease, unspecified: Secondary | ICD-10-CM | POA: Diagnosis not present

## 2019-08-31 DIAGNOSIS — I4821 Permanent atrial fibrillation: Secondary | ICD-10-CM | POA: Diagnosis not present

## 2019-08-31 DIAGNOSIS — J449 Chronic obstructive pulmonary disease, unspecified: Secondary | ICD-10-CM | POA: Diagnosis not present

## 2019-08-31 DIAGNOSIS — N400 Enlarged prostate without lower urinary tract symptoms: Secondary | ICD-10-CM | POA: Diagnosis not present

## 2019-08-31 DIAGNOSIS — I472 Ventricular tachycardia: Secondary | ICD-10-CM | POA: Diagnosis not present

## 2019-08-31 DIAGNOSIS — G4733 Obstructive sleep apnea (adult) (pediatric): Secondary | ICD-10-CM | POA: Diagnosis not present

## 2019-08-31 DIAGNOSIS — I5022 Chronic systolic (congestive) heart failure: Secondary | ICD-10-CM | POA: Diagnosis not present

## 2019-08-31 DIAGNOSIS — M109 Gout, unspecified: Secondary | ICD-10-CM | POA: Diagnosis not present

## 2019-08-31 DIAGNOSIS — I2510 Atherosclerotic heart disease of native coronary artery without angina pectoris: Secondary | ICD-10-CM | POA: Diagnosis not present

## 2019-08-31 DIAGNOSIS — Z95810 Presence of automatic (implantable) cardiac defibrillator: Secondary | ICD-10-CM | POA: Diagnosis not present

## 2019-08-31 DIAGNOSIS — K7460 Unspecified cirrhosis of liver: Secondary | ICD-10-CM | POA: Diagnosis not present

## 2019-08-31 DIAGNOSIS — K219 Gastro-esophageal reflux disease without esophagitis: Secondary | ICD-10-CM | POA: Diagnosis not present

## 2019-08-31 DIAGNOSIS — E785 Hyperlipidemia, unspecified: Secondary | ICD-10-CM | POA: Diagnosis not present

## 2019-08-31 NOTE — Telephone Encounter (Signed)
Caller Name: [place]ara  Relation to pt:  PT from [facility]  Call back number:[phone]

## 2019-08-31 NOTE — Telephone Encounter (Addendum)
Caller Name: Cesara  Relation to pt:  PT from [facility]  Call back [place]   Reason for call:  PT requesting verbal orders for 2x 2 and 1x 2

## 2019-08-31 NOTE — Telephone Encounter (Signed)
Verbal orders have been given  

## 2019-08-31 NOTE — Telephone Encounter (Signed)
The call back number in the message is not the correct number.

## 2019-09-04 DIAGNOSIS — R000 Tachycardia, unspecified: Secondary | ICD-10-CM | POA: Diagnosis not present

## 2019-09-04 DIAGNOSIS — I480 Paroxysmal atrial fibrillation: Secondary | ICD-10-CM | POA: Diagnosis not present

## 2019-09-04 DIAGNOSIS — I472 Ventricular tachycardia: Secondary | ICD-10-CM | POA: Diagnosis not present

## 2019-09-04 DIAGNOSIS — I10 Essential (primary) hypertension: Secondary | ICD-10-CM | POA: Diagnosis not present

## 2019-09-04 DIAGNOSIS — I2510 Atherosclerotic heart disease of native coronary artery without angina pectoris: Secondary | ICD-10-CM | POA: Diagnosis not present

## 2019-09-04 DIAGNOSIS — I5022 Chronic systolic (congestive) heart failure: Secondary | ICD-10-CM | POA: Diagnosis not present

## 2019-09-06 ENCOUNTER — Encounter: Payer: Self-pay | Admitting: Podiatry

## 2019-09-06 ENCOUNTER — Other Ambulatory Visit: Payer: Self-pay

## 2019-09-06 ENCOUNTER — Ambulatory Visit (INDEPENDENT_AMBULATORY_CARE_PROVIDER_SITE_OTHER): Payer: Medicare Other | Admitting: Podiatry

## 2019-09-06 DIAGNOSIS — B351 Tinea unguium: Secondary | ICD-10-CM | POA: Diagnosis not present

## 2019-09-06 DIAGNOSIS — M79676 Pain in unspecified toe(s): Secondary | ICD-10-CM | POA: Diagnosis not present

## 2019-09-06 DIAGNOSIS — D689 Coagulation defect, unspecified: Secondary | ICD-10-CM

## 2019-09-06 NOTE — Progress Notes (Signed)
This patient presents the office with chief complaint of long thick nails  He states the nails are painful walking and wearing his shoes. He is unable to self treat. He  presents the office today for an evaluation and treatment of his nails and    No pain from callus right forefoot today.  Patient is taking eliquiss.  GENERAL APPEARANCE: Alert, conversant. Appropriately groomed. No acute distress.  VASCULAR: Pedal pulses are  palpable at  DP and PT bilateral.  Capillary refill time is immediate to all digits,  Normal temperature gradient.  Digital hair growth is present bilateral  NEUROLOGIC: sensation is normal to 5.07 monofilament at 5/5 sites bilateral.  Light touch is intact bilateral, Muscle strength normal.  MUSCULOSKELETAL: acceptable muscle strength, tone and stability bilateral.  Intrinsic muscluature intact bilateral.  Rectus appearance of foot .  Severe hammer toes 1-5  B/L   DERMATOLOGIC: skin color, texture, and turgor are within normal limits.  No preulcerative lesions or ulcers  are seen, no interdigital maceration noted.  No open lesions present.  .   NAILS  thick disfigured discolored hallux toenails, both feet   Onychomycosis hallux nails    Debridement of nails. RTC  10 weeks  for preventive foot care services.       Loyce Klasen DPM  

## 2019-09-07 ENCOUNTER — Telehealth: Payer: Self-pay | Admitting: Internal Medicine

## 2019-09-07 NOTE — Telephone Encounter (Signed)
Rick Gilmore PT with [facility]   Calling in to update provider that Wednesday pt declined visit so pt was missed. Pt also declined visit today. Pt is requesting to discharge because he will begin Outpt PT next week.    CB: [phone]

## 2019-09-08 ENCOUNTER — Other Ambulatory Visit: Payer: Self-pay

## 2019-09-08 ENCOUNTER — Encounter: Payer: Self-pay | Admitting: Family Medicine

## 2019-09-08 ENCOUNTER — Inpatient Hospital Stay
Admission: EM | Admit: 2019-09-08 | Discharge: 2019-09-13 | DRG: 291 | Disposition: A | Discharge status: Home Health | Payer: Medicare Other | Attending: Internal Medicine | Admitting: Internal Medicine

## 2019-09-08 ENCOUNTER — Encounter: Payer: Self-pay | Admitting: Emergency Medicine

## 2019-09-08 ENCOUNTER — Emergency Department: Payer: Medicare Other

## 2019-09-08 ENCOUNTER — Telehealth: Payer: Self-pay | Admitting: Family Medicine

## 2019-09-08 DIAGNOSIS — R0602 Shortness of breath: Secondary | ICD-10-CM

## 2019-09-08 DIAGNOSIS — I252 Old myocardial infarction: Secondary | ICD-10-CM

## 2019-09-08 DIAGNOSIS — J90 Pleural effusion, not elsewhere classified: Secondary | ICD-10-CM | POA: Diagnosis not present

## 2019-09-08 DIAGNOSIS — F329 Major depressive disorder, single episode, unspecified: Secondary | ICD-10-CM | POA: Diagnosis present

## 2019-09-08 DIAGNOSIS — Z923 Personal history of irradiation: Secondary | ICD-10-CM | POA: Diagnosis not present

## 2019-09-08 DIAGNOSIS — I2510 Atherosclerotic heart disease of native coronary artery without angina pectoris: Secondary | ICD-10-CM | POA: Diagnosis not present

## 2019-09-08 DIAGNOSIS — I4891 Unspecified atrial fibrillation: Secondary | ICD-10-CM | POA: Diagnosis present

## 2019-09-08 DIAGNOSIS — R0989 Other specified symptoms and signs involving the circulatory and respiratory systems: Secondary | ICD-10-CM | POA: Diagnosis not present

## 2019-09-08 DIAGNOSIS — I130 Hypertensive heart and chronic kidney disease with heart failure and stage 1 through stage 4 chronic kidney disease, or unspecified chronic kidney disease: Secondary | ICD-10-CM | POA: Diagnosis present

## 2019-09-08 DIAGNOSIS — I129 Hypertensive chronic kidney disease with stage 1 through stage 4 chronic kidney disease, or unspecified chronic kidney disease: Secondary | ICD-10-CM | POA: Diagnosis not present

## 2019-09-08 DIAGNOSIS — E785 Hyperlipidemia, unspecified: Secondary | ICD-10-CM | POA: Diagnosis present

## 2019-09-08 DIAGNOSIS — J918 Pleural effusion in other conditions classified elsewhere: Secondary | ICD-10-CM | POA: Diagnosis present

## 2019-09-08 DIAGNOSIS — Z8546 Personal history of malignant neoplasm of prostate: Secondary | ICD-10-CM | POA: Diagnosis not present

## 2019-09-08 DIAGNOSIS — Z95810 Presence of automatic (implantable) cardiac defibrillator: Secondary | ICD-10-CM

## 2019-09-08 DIAGNOSIS — N179 Acute kidney failure, unspecified: Secondary | ICD-10-CM | POA: Diagnosis not present

## 2019-09-08 DIAGNOSIS — Z20828 Contact with and (suspected) exposure to other viral communicable diseases: Secondary | ICD-10-CM | POA: Diagnosis present

## 2019-09-08 DIAGNOSIS — K7460 Unspecified cirrhosis of liver: Secondary | ICD-10-CM | POA: Diagnosis present

## 2019-09-08 DIAGNOSIS — Z7901 Long term (current) use of anticoagulants: Secondary | ICD-10-CM | POA: Diagnosis not present

## 2019-09-08 DIAGNOSIS — D638 Anemia in other chronic diseases classified elsewhere: Secondary | ICD-10-CM | POA: Diagnosis present

## 2019-09-08 DIAGNOSIS — I5023 Acute on chronic systolic (congestive) heart failure: Secondary | ICD-10-CM | POA: Diagnosis not present

## 2019-09-08 DIAGNOSIS — J948 Other specified pleural conditions: Secondary | ICD-10-CM | POA: Diagnosis not present

## 2019-09-08 DIAGNOSIS — I517 Cardiomegaly: Secondary | ICD-10-CM | POA: Diagnosis not present

## 2019-09-08 DIAGNOSIS — Z79899 Other long term (current) drug therapy: Secondary | ICD-10-CM

## 2019-09-08 DIAGNOSIS — I5022 Chronic systolic (congestive) heart failure: Secondary | ICD-10-CM | POA: Diagnosis not present

## 2019-09-08 DIAGNOSIS — Z9889 Other specified postprocedural states: Secondary | ICD-10-CM

## 2019-09-08 DIAGNOSIS — R0609 Other forms of dyspnea: Secondary | ICD-10-CM

## 2019-09-08 DIAGNOSIS — Z951 Presence of aortocoronary bypass graft: Secondary | ICD-10-CM | POA: Diagnosis not present

## 2019-09-08 DIAGNOSIS — N189 Chronic kidney disease, unspecified: Secondary | ICD-10-CM | POA: Diagnosis present

## 2019-09-08 DIAGNOSIS — K219 Gastro-esophageal reflux disease without esophagitis: Secondary | ICD-10-CM | POA: Diagnosis present

## 2019-09-08 DIAGNOSIS — I110 Hypertensive heart disease with heart failure: Secondary | ICD-10-CM | POA: Diagnosis not present

## 2019-09-08 DIAGNOSIS — N183 Chronic kidney disease, stage 3 unspecified: Secondary | ICD-10-CM | POA: Diagnosis not present

## 2019-09-08 DIAGNOSIS — K7469 Other cirrhosis of liver: Secondary | ICD-10-CM | POA: Diagnosis present

## 2019-09-08 DIAGNOSIS — R0600 Dyspnea, unspecified: Secondary | ICD-10-CM | POA: Diagnosis not present

## 2019-09-08 DIAGNOSIS — Z7951 Long term (current) use of inhaled steroids: Secondary | ICD-10-CM

## 2019-09-08 DIAGNOSIS — M109 Gout, unspecified: Secondary | ICD-10-CM | POA: Diagnosis present

## 2019-09-08 DIAGNOSIS — I255 Ischemic cardiomyopathy: Secondary | ICD-10-CM | POA: Diagnosis present

## 2019-09-08 DIAGNOSIS — G4733 Obstructive sleep apnea (adult) (pediatric): Secondary | ICD-10-CM | POA: Diagnosis present

## 2019-09-08 DIAGNOSIS — I509 Heart failure, unspecified: Secondary | ICD-10-CM | POA: Diagnosis not present

## 2019-09-08 DIAGNOSIS — J449 Chronic obstructive pulmonary disease, unspecified: Secondary | ICD-10-CM | POA: Diagnosis not present

## 2019-09-08 DIAGNOSIS — N186 End stage renal disease: Secondary | ICD-10-CM | POA: Diagnosis not present

## 2019-09-08 DIAGNOSIS — I5021 Acute systolic (congestive) heart failure: Secondary | ICD-10-CM | POA: Diagnosis not present

## 2019-09-08 LAB — CBC WITH DIFFERENTIAL/PLATELET
Abs Immature Granulocytes: 0.06 10*3/uL (ref 0.00–0.07)
Basophils Absolute: 0 10*3/uL (ref 0.0–0.1)
Basophils Relative: 1 %
Eosinophils Absolute: 0.1 10*3/uL (ref 0.0–0.5)
Eosinophils Relative: 1 %
HCT: 28.9 % — ABNORMAL LOW (ref 39.0–52.0)
Hemoglobin: 9.7 g/dL — ABNORMAL LOW (ref 13.0–17.0)
Immature Granulocytes: 1 %
Lymphocytes Relative: 12 %
Lymphs Abs: 0.7 10*3/uL (ref 0.7–4.0)
MCH: 31.6 pg (ref 26.0–34.0)
MCHC: 33.6 g/dL (ref 30.0–36.0)
MCV: 94.1 fL (ref 80.0–100.0)
Monocytes Absolute: 0.5 10*3/uL (ref 0.1–1.0)
Monocytes Relative: 8 %
Neutro Abs: 4.9 10*3/uL (ref 1.7–7.7)
Neutrophils Relative %: 77 %
Platelets: 118 10*3/uL — ABNORMAL LOW (ref 150–400)
RBC: 3.07 MIL/uL — ABNORMAL LOW (ref 4.22–5.81)
RDW: 14.1 % (ref 11.5–15.5)
WBC: 6.2 10*3/uL (ref 4.0–10.5)
nRBC: 0 % (ref 0.0–0.2)

## 2019-09-08 LAB — APTT: aPTT: 48 seconds — ABNORMAL HIGH (ref 24–36)

## 2019-09-08 LAB — BASIC METABOLIC PANEL
Anion gap: 10 (ref 5–15)
BUN: 27 mg/dL — ABNORMAL HIGH (ref 8–23)
CO2: 24 mmol/L (ref 22–32)
Calcium: 8.7 mg/dL — ABNORMAL LOW (ref 8.9–10.3)
Chloride: 100 mmol/L (ref 98–111)
Creatinine, Ser: 2.06 mg/dL — ABNORMAL HIGH (ref 0.61–1.24)
GFR calc Af Amer: 35 mL/min — ABNORMAL LOW (ref >60)
GFR calc non Af Amer: 30 mL/min — ABNORMAL LOW (ref >60)
Glucose, Bld: 106 mg/dL — ABNORMAL HIGH (ref 70–99)
Potassium: 3.7 mmol/L (ref 3.5–5.1)
Sodium: 134 mmol/L — ABNORMAL LOW (ref 135–145)

## 2019-09-08 LAB — TROPONIN I (HIGH SENSITIVITY)
Troponin I (High Sensitivity): 37 ng/L — ABNORMAL HIGH (ref <18)
Troponin I (High Sensitivity): 35 ng/L — ABNORMAL HIGH (ref <18)

## 2019-09-08 LAB — SARS CORONAVIRUS 2 BY RT PCR (HOSPITAL ORDER, PERFORMED IN ~~LOC~~ HOSPITAL LAB): SARS Coronavirus 2: NEGATIVE

## 2019-09-08 LAB — BRAIN NATRIURETIC PEPTIDE: B Natriuretic Peptide: 711 pg/mL — ABNORMAL HIGH (ref 0.0–100.0)

## 2019-09-08 LAB — PROTIME-INR
INR: 1.8 — ABNORMAL HIGH (ref 0.8–1.2)
Prothrombin Time: 20.7 seconds — ABNORMAL HIGH (ref 11.4–15.2)

## 2019-09-08 MED ORDER — ACETAMINOPHEN 325 MG PO TABS: 650.0000 mg | ORAL_TABLET | Freq: Four times a day (QID) | ORAL | Status: DC | PRN

## 2019-09-08 MED ORDER — ACETAMINOPHEN 650 MG RE SUPP: 650.0000 mg | Freq: Four times a day (QID) | RECTAL | Status: DC | PRN

## 2019-09-08 MED ORDER — UMECLIDINIUM-VILANTEROL 62.5-25 MCG/INH IN AEPB
1.0000 | INHALATION_SPRAY | Freq: Every day | RESPIRATORY_TRACT | Status: DC
  Administered 2019-09-09 – 2019-09-13 (×5): 1 via RESPIRATORY_TRACT
  Filled 2019-09-08: qty 14

## 2019-09-08 MED ORDER — ONDANSETRON HCL 4 MG/2ML IJ SOLN: 4.0000 mg | Freq: Four times a day (QID) | INTRAMUSCULAR | Status: DC | PRN

## 2019-09-08 MED ORDER — FUROSEMIDE 10 MG/ML IJ SOLN
120.0000 mg | Freq: Once | INTRAVENOUS | Status: AC
  Administered 2019-09-08: 120 mg via INTRAVENOUS
  Filled 2019-09-08: qty 12

## 2019-09-08 MED ORDER — PANTOPRAZOLE SODIUM 40 MG PO TBEC
40.0000 mg | DELAYED_RELEASE_TABLET | Freq: Every day | ORAL | Status: DC
  Administered 2019-09-09 – 2019-09-13 (×5): 40 mg via ORAL
  Filled 2019-09-08 (×5): qty 1

## 2019-09-08 MED ORDER — SPIRONOLACTONE 25 MG PO TABS
25.0000 mg | ORAL_TABLET | Freq: Every day | ORAL | Status: DC
  Administered 2019-09-09 – 2019-09-13 (×5): 25 mg via ORAL
  Filled 2019-09-08 (×5): qty 1

## 2019-09-08 MED ORDER — ONDANSETRON HCL 4 MG PO TABS: 4.0000 mg | ORAL_TABLET | Freq: Four times a day (QID) | ORAL | Status: DC | PRN

## 2019-09-08 MED ORDER — ESCITALOPRAM OXALATE 10 MG PO TABS
10.0000 mg | ORAL_TABLET | Freq: Every day | ORAL | Status: DC
  Administered 2019-09-09 – 2019-09-13 (×5): 10 mg via ORAL
  Filled 2019-09-08 (×5): qty 1

## 2019-09-08 MED ORDER — AMIODARONE HCL 200 MG PO TABS
200.0000 mg | ORAL_TABLET | Freq: Every day | ORAL | Status: DC
  Administered 2019-09-09 – 2019-09-13 (×5): 200 mg via ORAL
  Filled 2019-09-08 (×5): qty 1

## 2019-09-08 MED ORDER — METOPROLOL SUCCINATE ER 50 MG PO TB24
75.0000 mg | ORAL_TABLET | Freq: Every day | ORAL | Status: DC
  Administered 2019-09-09: 75 mg via ORAL
  Filled 2019-09-08: qty 1

## 2019-09-08 MED ORDER — PRAVASTATIN SODIUM 40 MG PO TABS
80.0000 mg | ORAL_TABLET | Freq: Every day | ORAL | Status: DC
  Administered 2019-09-09 – 2019-09-12 (×4): 80 mg via ORAL
  Filled 2019-09-08 (×4): qty 2

## 2019-09-08 MED ORDER — MONTELUKAST SODIUM 10 MG PO TABS
10.0000 mg | ORAL_TABLET | Freq: Every day | ORAL | Status: DC
  Administered 2019-09-08 – 2019-09-12 (×5): 10 mg via ORAL
  Filled 2019-09-08 (×5): qty 1

## 2019-09-08 MED ORDER — TORSEMIDE 20 MG PO TABS: 40.0000 mg | ORAL_TABLET | Freq: Every day | ORAL | Status: DC

## 2019-09-08 MED ORDER — ISOSORBIDE MONONITRATE ER 30 MG PO TB24
30.0000 mg | ORAL_TABLET | Freq: Every day | ORAL | Status: DC
  Administered 2019-09-09 – 2019-09-13 (×5): 30 mg via ORAL
  Filled 2019-09-08 (×5): qty 1

## 2019-09-08 MED ORDER — TORSEMIDE 20 MG PO TABS
60.0000 mg | ORAL_TABLET | Freq: Every day | ORAL | Status: DC
  Administered 2019-09-09: 60 mg via ORAL
  Filled 2019-09-08: qty 3

## 2019-09-08 MED ORDER — SACUBITRIL-VALSARTAN 24-26 MG PO TABS
1.0000 | ORAL_TABLET | Freq: Two times a day (BID) | ORAL | Status: DC
  Administered 2019-09-09 – 2019-09-10 (×2): 1 via ORAL
  Filled 2019-09-08 (×3): qty 1

## 2019-09-08 MED ORDER — APIXABAN 5 MG PO TABS
5.0000 mg | ORAL_TABLET | Freq: Two times a day (BID) | ORAL | Status: DC
  Administered 2019-09-08 – 2019-09-09 (×2): 5 mg via ORAL
  Filled 2019-09-08 (×2): qty 1

## 2019-09-08 NOTE — ED Notes (Signed)
First Nurse Note: Pt to ED, pt was released from [facility] 08/30/19 for ablation. Pt has had increased shortness of breath since last night, has gotten worse throughout the day. Dr. Clayborn Bigness advised pt to come in. Pt is in NAD.

## 2019-09-08 NOTE — ED Provider Notes (Signed)
[facility] Emergency Department Provider Note  ____________________________________________  Time seen: Approximately 9:23 PM  I have reviewed the triage vital signs and the nursing notes.   HISTORY  Chief Complaint Shortness of Breath    HPI Rick Gilmore is a 78 y.o. male with a history of atrial fibrillation, CAD, CHF, COPD, hypertension who comes the ED complaining of dyspnea on exertion and a 5 pound weight gain over the last 2 days.  Constant, better with rest, no radiating pain.  Decreased oral intake/appetite.  He tried increasing his torsemide today at the direction of his cardiologist without relief.  He notes that his urine output has not been good over the last several days.  Denies fevers chills or body aches      Past Medical History:  Diagnosis Date  . AICD (automatic cardioverter/defibrillator) present   . Anemia   . Arrhythmia    a fib  . Atrial fibrillation ([place])    on Coumadin  . CAD (coronary artery disease)   . Cancer [facility]) 2001   Prostate, XRT and implant  . CHF (congestive heart failure) ([place])   . COPD (chronic obstructive pulmonary disease) ([place])   . Erectile dysfunction   . GERD (gastroesophageal reflux disease)   . Hyperlipidemia   . Hypertension   . Ischemic cardiomyopathy    s/p AICD/pacer 2009, replaced 2010 [facility]  . MI (myocardial infarction) ([place]) 1988  . Personal history of prostate cancer 2001   s/p XRT and implant ([place])  . Prostate cancer ([place])   . Sleep apnea, obstructive    uses CPAP nightly  . Thrombocytopenia ([place])   . Tubular adenoma    colon polyp  . Vitamin B 12 deficiency      Patient Active Problem List   Diagnosis Date Noted  . Hospital discharge follow-up 08/19/2019  . V tach ([place]) 08/09/2019  . Defibrillator discharge 08/09/2019  . V-tach ([place]) 08/09/2019  . 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 Obesity  04/20/2016  . Hearing loss of left ear due to cerumen impaction 04/19/2016  . Hypotension 03/10/2016  . Allergic rhinitis 02/02/2016  . Acute on chronic systolic CHF (congestive heart failure) ([place]) 01/16/2016  . Chronic renal insufficiency 01/16/2016  . Prediabetes 01/16/2016  . 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 S/P implantation of automatic cardioverter/defibrillator (AICD) 10/21/2015  . Diverticulosis of colon without hemorrhage 10/21/2015  . Internal hemorrhoids 10/21/2015  . Atrial fibrillation ([place]) 10/15/2015  . Tubular adenoma of colon 06/04/2015  . COPD (chronic obstructive pulmonary disease) ([place]) 04/20/2015  . Iron deficiency anemia 04/13/2015  . Thrombocytopenia ([place]) 12/01/2014  . Hyperlipidemia LDL goal <70 12/01/2014  . Chronic systolic heart failure ([place]) 10/08/2014  . Long term current use of anticoagulant therapy 11/02/2011  . Pernicious anemia 11/02/2011  . Ischemic cardiomyopathy   . Sleep apnea, obstructive      Past Surgical History:  Procedure Laterality Date  . CARDIAC CATHETERIZATION    . COLONOSCOPY WITH PROPOFOL [name] 05/30/2015   Procedure: COLONOSCOPY WITH PROPOFOL;  Surgeon: Manya Silvas, MD;  Location: [facility] ENDOSCOPY;  Service: Endoscopy;  Laterality: [name];  . COLONOSCOPY WITH PROPOFOL [name] 07/16/2019   Procedure: COLONOSCOPY WITH PROPOFOL;  Surgeon: Toledo, Benay Pike, MD;  Location: [facility] ENDOSCOPY;  Service: Gastroenterology;  Laterality: [name];  . CORONARY ARTERY BYPASS GRAFT  1988  . ESOPHAGOGASTRODUODENOSCOPY [name] 05/30/2015   Procedure: ESOPHAGOGASTRODUODENOSCOPY (EGD);  Surgeon: Manya Silvas,  MD;  Location: [facility] ENDOSCOPY;  Service: Endoscopy;  Laterality: [name];  . ESOPHAGOGASTRODUODENOSCOPY (EGD) WITH PROPOFOL [name] 07/16/2019   Procedure: ESOPHAGOGASTRODUODENOSCOPY (EGD) WITH PROPOFOL;  Surgeon: Toledo, Benay Pike, MD;  Location: [facility] ENDOSCOPY;  Service: Gastroenterology;  Laterality: [name];  . IMPLANTABLE CARDIOVERTER  DEFIBRILLATOR GENERATOR CHANGE  April 2014   Bi V RRR  . INSERT / REPLACE / REMOVE PACEMAKER  2014  . VASECTOMY       Prior to Admission medications   Medication Sig Start Date End Date Taking? Authorizing Provider  albuterol (PROVENTIL HFA;VENTOLIN HFA) 108 (90 Base) MCG/ACT inhaler Inhale 2 puffs into the lungs every 6 (six) hours as needed for wheezing or shortness of breath. 12/26/18   Guse, Jacquelynn Cree, FNP  allopurinol (ZYLOPRIM) 100 MG tablet Take 1 tablet (100 mg total) by mouth daily. 08/17/19   Crecencio Mc, MD  amiodarone (PACERONE) 200 MG tablet Take 1 tablet (200 mg total) by mouth 2 (two) times daily for 14 days. 08/14/19 08/28/19  Dustin Flock, MD  amiodarone (PACERONE) 200 MG tablet Take 1 tablet (200 mg total) by mouth daily. 08/29/19   Dustin Flock, MD  ANORO ELLIPTA 62.5-25 MCG/INH AEPB USE 1 INHALATION DAILY Patient taking differently: Inhale 1 puff into the lungs daily.  06/12/19   Wilhelmina Mcardle, MD  apixaban (ELIQUIS) 5 MG TABS tablet Take 5 mg by mouth 2 (two) times daily.  06/12/19   [provider]  colchicine 0.6 MG tablet Take 0.6 mg by mouth daily.    [provider]  Cyanocobalamin 1000 MCG SUBL Place 1 tablet (1,000 mcg total) under the tongue every morning. 06/15/17   Crecencio Mc, MD  ENTRESTO 24-26 MG TAKE 1 TABLET TWICE A DAY Patient taking differently: Take 1 tablet by mouth 2 (two) times daily.  06/12/19   Crecencio Mc, MD  escitalopram (LEXAPRO) 10 MG tablet Take 1 tablet (10 mg total) by mouth daily. 08/17/19   Crecencio Mc, MD  fluticasone (FLONASE) 50 MCG/ACT nasal spray USE 1 SPRAY IN EACH NOSTRIL DAILY Patient taking differently: Place 2 sprays into both nostrils daily.  05/15/18   Wilhelmina Mcardle, MD  isosorbide mononitrate (IMDUR) 30 MG 24 hr tablet Take 30 mg by mouth daily.    [provider]  metolazone (ZAROXOLYN) 2.5 MG tablet Take 1 tablet (2.5 mg total) by mouth 2 (two) times daily. 08/17/19   Crecencio Mc, MD   metoprolol succinate (TOPROL-XL) 25 MG 24 hr tablet Take 3 tablets (75 mg total) by mouth daily. 08/15/19   Dustin Flock, MD  montelukast (SINGULAIR) 10 MG tablet Take 1 tablet (10 mg total) by mouth at bedtime. 08/17/19   Crecencio Mc, MD  pantoprazole (PROTONIX) 40 MG tablet Take 1 tablet (40 mg total) by mouth daily. 05/10/19   Crecencio Mc, MD  potassium chloride SA (K-DUR) 20 MEQ tablet Take 1 tablet (20 mEq total) by mouth daily. 08/17/19   Crecencio Mc, MD  pravastatin (PRAVACHOL) 80 MG tablet Take 80 mg by mouth daily.    [provider]  spironolactone (ALDACTONE) 25 MG tablet Take 25 mg by mouth daily.    [provider]  torsemide (DEMADEX) 20 MG tablet TAKE 3 TABLETS IN THE MORNING AND 2 TABLETS IN THE EVENING Patient taking differently: Take 40-60 mg by mouth 2 (two) times daily. 3 tablets (60mg ) in the morning and 2 tablets (40mg ) in the afternoon 06/29/18   Alisa Graff, FNP  Allergies Patient has no known allergies.   Family History  Problem Relation Age of Onset  . Hypertension Mother   . Hypertension Father   . Cancer Father        prostate, throat  . Emphysema Father   . Heart disease Maternal Grandmother   . Heart disease Paternal Grandmother     Social History Social History   Tobacco Use  . Smoking status: Never Smoker  . Smokeless tobacco: Never Used  Substance Use Topics  . Alcohol use: Yes    Alcohol/week: 4.0 standard drinks    Types: 2 Standard drinks or equivalent, 2 Cans of beer per week    Comment: Mixed drinks  . Drug use: No    Review of Systems  Constitutional:   No fever or chills.  ENT:   No sore throat. No rhinorrhea. Cardiovascular:   No chest pain or syncope. Respiratory:   Positive dyspnea on exertion without cough. Gastrointestinal:   Negative for abdominal pain, vomiting and diarrhea.  Musculoskeletal:   Positive bilateral leg swelling All other systems reviewed and are negative except as  documented above in ROS and HPI.  ____________________________________________   PHYSICAL EXAM:  VITAL SIGNS: ED Triage Vitals  Enc Vitals Group     BP 09/08/19 1922 103/79     Pulse Rate 09/08/19 1922 76     Resp 09/08/19 1922 20     Temp 09/08/19 1922 98.1 F (36.7 C)     Temp Source 09/08/19 1922 Oral     SpO2 09/08/19 1920 95 %     Weight 09/08/19 1922 256 lb (116.1 kg)     Height 09/08/19 1922 6\' 2"  (1.88 m)     Head Circumference --      Peak Flow --      Pain Score 09/08/19 1922 0     Pain Loc --      Pain Edu? --      Excl. in [place]? --     Vital signs reviewed, nursing assessments reviewed.   Constitutional:   Alert and oriented. Non-toxic appearance. Eyes:   Conjunctivae are normal. EOMI. PERRL. ENT      Head:   Normocephalic and atraumatic.      Nose:   Wearing a mask.      Mouth/Throat:   Wearing a mask.      Neck:   No meningismus. Full ROM.  No JVD Hematological/Lymphatic/Immunilogical:   No cervical lymphadenopathy. Cardiovascular:   RRR. Symmetric bilateral radial and DP pulses.  No murmurs. Cap refill less than 2 seconds. Respiratory:   Normal respiratory effort without tachypnea/retractions.  Crackles at left base.  Diminished breath sounds at right base.   Gastrointestinal:   Soft and nontender. Non distended. There is no CVA tenderness.  No rebound, rigidity, or guarding.  Musculoskeletal:   Normal range of motion in all extremities. No joint effusions.  No lower extremity tenderness.  1+ pitting edema bilateral lower extremities Neurologic:   Normal speech and language.  Motor grossly intact. No acute focal neurologic deficits are appreciated.  Skin:    Skin is warm, dry and intact. No rash noted.  No petechiae, purpura, or bullae.  ____________________________________________    LABS (pertinent positives/negatives) (all labs ordered are listed, but only abnormal results are displayed) Labs Reviewed  CBC WITH DIFFERENTIAL/PLATELET - Abnormal;  Notable for the following components:      Result Value   RBC 3.07 (*)    Hemoglobin 9.7 (*)    HCT  28.9 (*)    Platelets 118 (*)    All other components within normal limits  BASIC METABOLIC PANEL - Abnormal; Notable for the following components:   Sodium 134 (*)    Glucose, Bld 106 (*)    BUN 27 (*)    Creatinine, Ser 2.06 (*)    Calcium 8.7 (*)    GFR calc non Af Amer 30 (*)    GFR calc Af Amer 35 (*)    All other components within normal limits  BRAIN NATRIURETIC PEPTIDE - Abnormal; Notable for the following components:   B Natriuretic Peptide 711.0 (*)    All other components within normal limits  PROTIME-INR - Abnormal; Notable for the following components:   Prothrombin Time 20.7 (*)    INR 1.8 (*)    All other components within normal limits  APTT - Abnormal; Notable for the following components:   aPTT 48 (*)    All other components within normal limits  SARS CORONAVIRUS 2 (HOSPITAL ORDER, [facility])  TROPONIN I (HIGH SENSITIVITY)  TROPONIN I (HIGH SENSITIVITY)   ____________________________________________   EKG  Interpreted by me Ventricular paced rhythm, rate of 73, right axis, left bundle branch block.  No acute ischemic changes.  ____________________________________________    [id number]  Dg Chest Portable 1 View  Result Date: 09/08/2019 CLINICAL DATA:  Dyspnea with exertion. EXAM: PORTABLE CHEST 1 VIEW COMPARISON:  08/21/2019 FINDINGS: Stable enlarged cardiac silhouette, post CABG changes and left subclavian pacer and AICD leads. Stable prominence of the pulmonary vasculature and interstitial markings. Interval small to moderate-sized right pleural effusion. No acute bony abnormality. IMPRESSION: 1. Interval small to moderate-sized right pleural effusion. 2. Stable cardiomegaly, pulmonary vascular congestion and mild chronic interstitial lung disease. Electronically Signed   By: Claudie Revering M.D.   On: 09/08/2019 20:19     ____________________________________________   PROCEDURES Procedures  ____________________________________________  DIFFERENTIAL DIAGNOSIS   CHF exacerbation/pulmonary edema, pleural effusion, COPD exacerbation, non-STEMI, electrolyte abnormality  CLINICAL IMPRESSION / ASSESSMENT AND PLAN / ED COURSE  Medications ordered in the ED: Medications  furosemide (LASIX) 120 mg in dextrose 5 % 50 mL IVPB (120 mg Intravenous New Bag/Given 09/08/19 2046)    Pertinent labs & imaging results that were available during my care of the patient were reviewed by me and considered in my medical decision making (see chart for details).  Eliam Snapp was evaluated in Emergency Department on 09/08/2019 for the symptoms described in the history of present illness. He was evaluated in the context of the global COVID-19 pandemic, which necessitated consideration that the patient might be at risk for infection with the SARS-CoV-2 virus that causes COVID-19. Institutional protocols and algorithms that pertain to the evaluation of patients at risk for COVID-19 are in a state of rapid change based on information released by regulatory bodies including the CDC and federal and state organizations. These policies and algorithms were followed during the patient's care in the ED.   Patient presents with dyspnea on exertion, likely CHF exacerbation.  Check labs, chest x-ray.  Clinical Course as of Sep 07 2122  Sat Sep 08, 2019  1937 Cardiology Dr. Clayborn Bigness at bedside.   [PS]    Clinical Course User Index [PS] Carrie Mew, MD    ----------------------------------------- 9:25 PM on 09/08/2019 -----------------------------------------  Case discussed with Dr. Clayborn Bigness in the ED after his evaluation of the patient.  Feels that it is likely related to heart failure.  Recommends IV Lasix.  Patient  lives alone and feels hesitant about the possibility of going home again tonight.  With his weight gain and  severity of symptoms, I will plan to hospitalize for diuresis until euvolemic and symptomatically improved.  Additionally with his pleural effusion he may require thoracentesis.   ____________________________________________   FINAL CLINICAL IMPRESSION(S) / ED DIAGNOSES    Final diagnoses:  Acute on chronic systolic congestive heart failure (HCC)  DOE (dyspnea on exertion)  Pleural effusion   ED Discharge Orders    None      Portions of this note were generated with dragon dictation software. Dictation errors may occur despite best attempts at proofreading.   Carrie Mew, MD 09/08/19 2126

## 2019-09-08 NOTE — ED Notes (Signed)
Gracie RN aware of bed assigned

## 2019-09-08 NOTE — ED Notes (Signed)
ED TO INPATIENT HANDOFF REPORT  ED Nurse Name and Phone #: gracie  S Name/Age/Gender Rick Gilmore 78 y.o. male Room/Bed: ED01A/ED01A  Code Status   Code Status: Prior  Home/SNF/Other Home Patient oriented to: self, place, time and situation Is this baseline? Yes   Triage Complete: Triage complete  Chief Complaint SOB  Triage Note Pt arrives POV with c/o of becoming winded upon excursion. Pt was DC from [place] last Wednesday or Thursday where he had an ablation. Pt states that he had to stay for a week and a half due to poor kidney function. Pt currently has +1 edema in the lower extremities and has taken 120 of Torsemide today after consulting his PCP. Pt shows a hard work of breathing upon transferring out of wheelchair into recliner. Pt's lung sounds are clear upon ascultation in all lobes. Pt states that he has had urinary output today but not a lot.    Allergies No Known Allergies  Level of Care/Admitting Diagnosis ED Disposition    ED Disposition Condition [facility] Area: [place] [[id number]]  Level of Care: Telemetry [5]  Covid Evaluation: Person Under Investigation (PUI)  Diagnosis: Acute on chronic systolic CHF (congestive heart failure) [facility]) [[id number]]  Admitting Physician: Lance Coon [[id number]]  Attending Physician: Lance Coon [phone]  Estimated length of stay: past midnight tomorrow  Certification:: I certify this patient will need inpatient services for at least 2 midnights  Bed request comments: 2a  PT Class (Do Not Modify): Inpatient [101]  PT Acc Code (Do Not Modify): Private [1]       B Medical/Surgery History Past Medical History:  Diagnosis Date  . AICD (automatic cardioverter/defibrillator) present   . Anemia   . Arrhythmia    a fib  . Atrial fibrillation ([place])    on Coumadin  . CAD (coronary artery disease)   . Cancer [facility] 2001   Prostate, XRT and implant  . CHF (congestive heart failure) ([place])    . COPD (chronic obstructive pulmonary disease) ([place])   . Erectile dysfunction   . GERD (gastroesophageal reflux disease)   . Hyperlipidemia   . Hypertension   . Ischemic cardiomyopathy    s/p AICD/pacer 2009, replaced 2010 [facility]  . MI (myocardial infarction) ([place]) 1988  . Personal history of prostate cancer 2001   s/p XRT and implant ([place])  . Prostate cancer ([place])   . Sleep apnea, obstructive    uses CPAP nightly  . Thrombocytopenia ([place])   . Tubular adenoma    colon polyp  . Vitamin B 12 deficiency    Past Surgical History:  Procedure Laterality Date  . CARDIAC CATHETERIZATION    . COLONOSCOPY WITH PROPOFOL [name] 05/30/2015   Procedure: COLONOSCOPY WITH PROPOFOL;  Surgeon: Manya Silvas, MD;  Location[facility] ENDOSCOPY;  Service: Endoscopy;  Laterality: [name];  . COLONOSCOPY WITH PROPOFOL [name] 07/16/2019   Procedure: COLONOSCOPY WITH PROPOFOL;  Surgeon: Toledo, Benay Pike, MD;  Location: [facility] ENDOSCOPY;  Service: Gastroenterology;  Laterality: [name];  . CORONARY ARTERY BYPASS GRAFT  1988  . ESOPHAGOGASTRODUODENOSCOPY [name] 05/30/2015   Procedure: ESOPHAGOGASTRODUODENOSCOPY (EGD);  Surgeon: Manya Silvas, MD;  Location: [facility] ENDOSCOPY;  Service: Endoscopy;  Laterality: [name];  . ESOPHAGOGASTRODUODENOSCOPY (EGD) WITH PROPOFOL [name] 07/16/2019   Procedure: ESOPHAGOGASTRODUODENOSCOPY (EGD) WITH PROPOFOL;  Surgeon: Toledo, Benay Pike, MD;  Location: [facility] ENDOSCOPY;  Service: Gastroenterology;  Laterality: [name];  . IMPLANTABLE CARDIOVERTER DEFIBRILLATOR GENERATOR CHANGE  April 2014   Bi V RRR  .  INSERT / REPLACE / REMOVE PACEMAKER  2014  . VASECTOMY       A IV Location/Drains/Wounds Patient Lines/Drains/Airways Status   Active Line/Drains/Airways    Name:   Placement date:   Placement time:   Site:   Days:   Peripheral IV 08/21/19 Right Hand   08/21/19    1530    Hand   18   Peripheral IV 08/21/19 Right Forearm   08/21/19    -    Forearm   18          Intake/Output Last 24 hours No  intake or output data in the 24 hours ending 09/08/19 2129  Labs/Imaging Results for orders placed or performed during the hospital encounter of 09/08/19 (from the past 48 hour(s))  CBC with Differential     Status: Abnormal   Collection Time: 09/08/19  7:52 PM  Result Value Ref Range   WBC 6.2 4.0 - 10.5 K/uL   RBC 3.07 (L) 4.22 - 5.81 MIL/uL   Hemoglobin 9.7 (L) 13.0 - 17.0 g/dL   HCT 28.9 (L) 39.0 - 52.0 %   MCV 94.1 80.0 - 100.0 fL   MCH 31.6 26.0 - 34.0 pg   MCHC 33.6 30.0 - 36.0 g/dL   RDW 14.1 11.5 - 15.5 %   Platelets 118 (L) 150 - 400 K/uL    Comment: REPEATED TO VERIFY Immature Platelet Fraction may be clinically indicated, consider ordering this additional test [id number]    nRBC 0.0 0.0 - 0.2 %   Neutrophils Relative % 77 %   Neutro Abs 4.9 1.7 - 7.7 K/uL   Lymphocytes Relative 12 %   Lymphs Abs 0.7 0.7 - 4.0 K/uL   Monocytes Relative 8 %   Monocytes Absolute 0.5 0.1 - 1.0 K/uL   Eosinophils Relative 1 %   Eosinophils Absolute 0.1 0.0 - 0.5 K/uL   Basophils Relative 1 %   Basophils Absolute 0.0 0.0 - 0.1 K/uL   Immature Granulocytes 1 %   Abs Immature Granulocytes 0.06 0.00 - 0.07 K/uL    Comment: Performed at [facility], [address] [place], [place] [address]  Basic metabolic panel     Status: Abnormal   Collection Time: 09/08/19  7:52 PM  Result Value Ref Range   Sodium 134 (L) 135 - 145 mmol/L   Potassium 3.7 3.5 - 5.1 mmol/L   Chloride 100 98 - 111 mmol/L   CO2 24 22 - 32 mmol/L   Glucose, Bld 106 (H) 70 - 99 mg/dL   BUN 27 (H) 8 - 23 mg/dL   Creatinine, Ser 2.06 (H) 0.61 - 1.24 mg/dL   Calcium 8.7 (L) 8.9 - 10.3 mg/dL   GFR calc non Af Amer 30 (L) >60 mL/min   GFR calc Af Amer 35 (L) >60 mL/min   Anion gap 10 5 - 15    Comment: Performed at [facility], [place], [place] [address]  Troponin I (High Sensitivity)     Status: Abnormal   Collection Time: 09/08/19  7:52 PM  Result Value Ref Range   Troponin I  (High Sensitivity) 37 (H) <18 ng/L    Comment: (NOTE) Elevated high sensitivity troponin I (hsTnI) values and significant  changes across serial measurements may suggest ACS but many other  chronic and acute conditions are known to elevate hsTnI results.  Refer to the "Links" section for chest pain algorithms and additional  guidance. Performed at [facility], [address] [place], [place] [address]   Brain natriuretic peptide     Status: Abnormal   Collection Time: 09/08/19  7:52 PM  Result Value Ref Range   B Natriuretic Peptide 711.0 (H) 0.0 - 100.0 pg/mL    Comment: Performed at [facility], [address] [place], [place] [address]  Protime-INR     Status: Abnormal   Collection Time: 09/08/19  7:52 PM  Result Value Ref Range   Prothrombin Time 20.7 (H) 11.4 - 15.2 seconds   INR 1.8 (H) 0.8 - 1.2    Comment: (NOTE) INR goal varies based on device and disease states. Performed at [facility], [address] [place], [place] [address]   APTT     Status: Abnormal   Collection Time: 09/08/19  7:52 PM  Result Value Ref Range   aPTT 48 (H) 24 - 36 seconds    Comment:        IF BASELINE aPTT IS ELEVATED, SUGGEST PATIENT RISK ASSESSMENT BE USED TO DETERMINE APPROPRIATE ANTICOAGULANT THERAPY. Performed at [facility], [address] [place], [place] [address]    Dg Chest Portable 1 View  Result Date: 09/08/2019 CLINICAL DATA:  Dyspnea with exertion. EXAM: PORTABLE CHEST 1 VIEW COMPARISON:  08/21/2019 FINDINGS: Stable enlarged cardiac silhouette, post CABG changes and left subclavian pacer and AICD leads. Stable prominence of the pulmonary vasculature and interstitial markings. Interval small to moderate-sized right pleural effusion. No acute bony abnormality. IMPRESSION: 1. Interval small to moderate-sized right pleural effusion. 2. Stable cardiomegaly, pulmonary vascular congestion and mild chronic interstitial lung disease.  Electronically Signed   By: Claudie Revering M.D.   On: 09/08/2019 20:19    Pending Labs Unresulted Labs (From admission, onward)    Start     Ordered   09/08/19 1952  SARS Coronavirus 2 [facility] order, Performed in [facility] hospital lab) Nasopharyngeal Nasopharyngeal Swab  (Symptomatic/High Risk of Exposure/Tier 1 Patients Labs with Precautions)  ONCE - STAT,   STAT    Question Answer Comment  Is this test for diagnosis or screening Diagnosis of ill patient   Symptomatic for COVID-19 as defined by CDC Yes   Date of Symptom Onset 09/07/2019   Hospitalized for COVID-19 No   Admitted to ICU for COVID-19 No   Previously tested for COVID-19 Yes   Resident in a congregate (group) care setting No   Employed in healthcare setting No      09/08/19 1951   Signed and Held  Basic metabolic panel  Tomorrow morning,   R     Signed and Held   Signed and Held  CBC  Tomorrow morning,   R     Signed and Held          Vitals/Pain Today's Vitals   09/08/19 1920 09/08/19 1922 09/08/19 2000 09/08/19 2030  BP:  103/79 (!) 103/55 (!) 101/50  Pulse:  76 70 68  Resp:  20 18 18   Temp:  98.1 F (36.7 C)    TempSrc:  Oral    SpO2: 95% 95% 97% 94%  Weight:  116.1 kg    Height:  6\' 2"  (1.88 m)    PainSc:  0-No pain      Isolation Precautions No active isolations  Medications Medications  furosemide (LASIX) 120 mg in dextrose 5 % 50 mL IVPB (120 mg Intravenous New Bag/Given 09/08/19 2046)    Mobility walks Low fall risk   Focused Assessments Pulmonary Assessment Handoff:  Lung sounds: Bilateral Breath  Sounds: Fine crackles L Breath Sounds: Clear R Breath Sounds: Clear O2 Device: Room Air        R Recommendations: See Admitting Provider Note  Report given to:   Additional Notes:

## 2019-09-08 NOTE — ED Triage Notes (Signed)
Pt arrives POV with c/o of becoming winded upon excursion. Pt was DC from [place] last Wednesday or Thursday where he had an ablation. Pt states that he had to stay for a week and a half due to poor kidney function. Pt currently has +1 edema in the lower extremities and has taken 120 of Torsemide today after consulting his PCP. Pt shows a hard work of breathing upon transferring out of wheelchair into recliner. Pt's lung sounds are clear upon ascultation in all lobes. Pt states that he has had urinary output today but not a lot.

## 2019-09-08 NOTE — H&P (Signed)
[place] [facility] [place] NAME: Rick Gilmore    MR#:  [id number]  DATE OF BIRTH:  01-Jul-1941  DATE OF ADMISSION:  09/08/2019  PRIMARY CARE PHYSICIAN: Crecencio Mc, MD   REQUESTING/REFERRING PHYSICIAN: Joni Fears, MD  CHIEF COMPLAINT:   Chief Complaint  Patient presents with  . Shortness of Breath    HISTORY OF PRESENT ILLNESS:  Rick Gilmore  is a 78 y.o. male who presents with chief complaint as above.  Patient presents the ED with 2 days worsening shortness of breath and increased lower extremity edema.  He has known condition of heart failure with an EF of around 30%.  He has had prior AICD placement.  He has significant cardiac history including remote cardiac bypass.  Cardiology saw him in the ED tonight and recommends admission for further diuresis.  Hospitalist called for the same  PAST MEDICAL HISTORY:   Past Medical History:  Diagnosis Date  . AICD (automatic cardioverter/defibrillator) present   . Anemia   . Arrhythmia    a fib  . Atrial fibrillation ([place])    on Coumadin  . CAD (coronary artery disease)   . Cancer [facility] 2001   Prostate, XRT and implant  . CHF (congestive heart failure) ([place])   . COPD (chronic obstructive pulmonary disease) ([place])   . Erectile dysfunction   . GERD (gastroesophageal reflux disease)   . Hyperlipidemia   . Hypertension   . Ischemic cardiomyopathy    s/p AICD/pacer 2009, replaced 2010 [facility]  . MI (myocardial infarction) ([place]) 1988  . Personal history of prostate cancer 2001   s/p XRT and implant ([place])  . Prostate cancer ([place])   . Sleep apnea, obstructive    uses CPAP nightly  . Thrombocytopenia ([place])   . Tubular adenoma    colon polyp  . Vitamin B 12 deficiency      PAST SURGICAL HISTORY:   Past Surgical History:  Procedure Laterality Date  . CARDIAC CATHETERIZATION    . COLONOSCOPY WITH PROPOFOL [name] 05/30/2015   Procedure: COLONOSCOPY WITH PROPOFOL;  Surgeon: Manya Silvas, MD;  Location: [facility] ENDOSCOPY;  Service: Endoscopy;  Laterality: [name];  . COLONOSCOPY WITH PROPOFOL [name] 07/16/2019   Procedure: COLONOSCOPY WITH PROPOFOL;  Surgeon: Toledo, Benay Pike, MD;  Location: [facility] ENDOSCOPY;  Service: Gastroenterology;  Laterality: [name];  . CORONARY ARTERY BYPASS GRAFT  1988  . ESOPHAGOGASTRODUODENOSCOPY [name] 05/30/2015   Procedure: ESOPHAGOGASTRODUODENOSCOPY (EGD);  Surgeon: Manya Silvas, MD;  Location: [facility] ENDOSCOPY;  Service: Endoscopy;  Laterality: [name];  . ESOPHAGOGASTRODUODENOSCOPY (EGD) WITH PROPOFOL [name] 07/16/2019   Procedure: ESOPHAGOGASTRODUODENOSCOPY (EGD) WITH PROPOFOL;  Surgeon: Toledo, Benay Pike, MD;  Location: [facility] ENDOSCOPY;  Service: Gastroenterology;  Laterality: [name];  . IMPLANTABLE CARDIOVERTER DEFIBRILLATOR GENERATOR CHANGE  April 2014   Bi V RRR  . INSERT / REPLACE / REMOVE PACEMAKER  2014  . VASECTOMY       SOCIAL HISTORY:   Social History   Tobacco Use  . Smoking status: Never Smoker  . Smokeless tobacco: Never Used  Substance Use Topics  . Alcohol use: Yes    Alcohol/week: 4.0 standard drinks    Types: 2 Standard drinks or equivalent, 2 Cans of beer per week    Comment: Mixed drinks     FAMILY HISTORY:   Family History  Problem Relation Age of Onset  . Hypertension Mother   . Hypertension Father   . Cancer Father        prostate,  throat  . Emphysema Father   . Heart disease Maternal Grandmother   . Heart disease Paternal Grandmother      DRUG ALLERGIES:  No Known Allergies  MEDICATIONS AT HOME:   Prior to Admission medications   Medication Sig Start Date End Date Taking? Authorizing Provider  albuterol (PROVENTIL HFA;VENTOLIN HFA) 108 (90 Base) MCG/ACT inhaler Inhale 2 puffs into the lungs every 6 (six) hours as needed for wheezing or shortness of breath. 12/26/18   Guse, Jacquelynn Cree, FNP  allopurinol (ZYLOPRIM) 100 MG tablet Take 1 tablet (100 mg total) by mouth daily. 08/17/19   Crecencio Mc, MD  amiodarone  (PACERONE) 200 MG tablet Take 1 tablet (200 mg total) by mouth 2 (two) times daily for 14 days. 08/14/19 08/28/19  Dustin Flock, MD  amiodarone (PACERONE) 200 MG tablet Take 1 tablet (200 mg total) by mouth daily. 08/29/19   Dustin Flock, MD  ANORO ELLIPTA 62.5-25 MCG/INH AEPB USE 1 INHALATION DAILY Patient taking differently: Inhale 1 puff into the lungs daily.  06/12/19   Wilhelmina Mcardle, MD  apixaban (ELIQUIS) 5 MG TABS tablet Take 5 mg by mouth 2 (two) times daily.  06/12/19   [provider]  colchicine 0.6 MG tablet Take 0.6 mg by mouth daily.    [provider]  Cyanocobalamin 1000 MCG SUBL Place 1 tablet (1,000 mcg total) under the tongue every morning. 06/15/17   Crecencio Mc, MD  ENTRESTO 24-26 MG TAKE 1 TABLET TWICE A DAY Patient taking differently: Take 1 tablet by mouth 2 (two) times daily.  06/12/19   Crecencio Mc, MD  escitalopram (LEXAPRO) 10 MG tablet Take 1 tablet (10 mg total) by mouth daily. 08/17/19   Crecencio Mc, MD  fluticasone (FLONASE) 50 MCG/ACT nasal spray USE 1 SPRAY IN EACH NOSTRIL DAILY Patient taking differently: Place 2 sprays into both nostrils daily.  05/15/18   Wilhelmina Mcardle, MD  isosorbide mononitrate (IMDUR) 30 MG 24 hr tablet Take 30 mg by mouth daily.    [provider]  metolazone (ZAROXOLYN) 2.5 MG tablet Take 1 tablet (2.5 mg total) by mouth 2 (two) times daily. 08/17/19   Crecencio Mc, MD  metoprolol succinate (TOPROL-XL) 25 MG 24 hr tablet Take 3 tablets (75 mg total) by mouth daily. 08/15/19   Dustin Flock, MD  montelukast (SINGULAIR) 10 MG tablet Take 1 tablet (10 mg total) by mouth at bedtime. 08/17/19   Crecencio Mc, MD  pantoprazole (PROTONIX) 40 MG tablet Take 1 tablet (40 mg total) by mouth daily. 05/10/19   Crecencio Mc, MD  potassium chloride SA (K-DUR) 20 MEQ tablet Take 1 tablet (20 mEq total) by mouth daily. 08/17/19   Crecencio Mc, MD  pravastatin (PRAVACHOL) 80 MG tablet Take 80 mg by mouth daily.     [provider]  spironolactone (ALDACTONE) 25 MG tablet Take 25 mg by mouth daily.    [provider]  torsemide (DEMADEX) 20 MG tablet TAKE 3 TABLETS IN THE MORNING AND 2 TABLETS IN THE EVENING Patient taking differently: Take 40-60 mg by mouth 2 (two) times daily. 3 tablets (60mg ) in the morning and 2 tablets (40mg ) in the afternoon 06/29/18   Alisa Graff, FNP    REVIEW OF SYSTEMS:  Review of Systems  Constitutional: Negative for chills, fever, malaise/fatigue and weight loss.  HENT: Negative for ear pain, hearing loss and tinnitus.   Eyes: Negative for blurred vision, double vision, pain and redness.  Respiratory: Positive for shortness of breath. Negative for cough and hemoptysis.   Cardiovascular: Positive for leg swelling. Negative for chest pain, palpitations and orthopnea.  Gastrointestinal: Negative for abdominal pain, constipation, diarrhea, nausea and vomiting.  Genitourinary: Negative for dysuria, frequency and hematuria.  Musculoskeletal: Negative for back pain, joint pain and neck pain.  Skin:       No acne, rash, or lesions  Neurological: Negative for dizziness, tremors, focal weakness and weakness.  Endo/Heme/Allergies: Negative for polydipsia. Does not bruise/bleed easily.  Psychiatric/Behavioral: Negative for depression. The patient is not nervous/anxious and does not have insomnia.      VITAL SIGNS:   Vitals:   09/08/19 1920 09/08/19 1922  BP:  103/79  Pulse:  76  Resp:  20  Temp:  98.1 F (36.7 C)  TempSrc:  Oral  SpO2: 95% 95%  Weight:  116.1 kg  Height:  6\' 2"  (1.88 m)   Wt Readings from Last 3 Encounters:  09/08/19 116.1 kg  08/21/19 123.4 kg  08/17/19 123.4 kg    PHYSICAL EXAMINATION:  Physical Exam  Vitals reviewed. Constitutional: He is oriented to person, place, and time. He appears well-developed and well-nourished. No distress.  HENT:  Head: Normocephalic and atraumatic.  Mouth/Throat: Oropharynx is clear and moist.   Eyes: Pupils are equal, round, and reactive to light. Conjunctivae and EOM are normal. No scleral icterus.  Neck: Normal range of motion. Neck supple. No JVD present. No thyromegaly present.  Cardiovascular: Normal rate and intact distal pulses. Exam reveals no gallop and no friction rub.  No murmur heard. Ventricular paced rhythm  Respiratory: Effort normal. No respiratory distress. He has no wheezes. He has rales.  GI: Soft. Bowel sounds are normal. He exhibits no distension. There is no abdominal tenderness.  Musculoskeletal: Normal range of motion.        General: Edema (Bilateral lower extremities) present.     Comments: No arthritis, no gout  Lymphadenopathy:    He has no cervical adenopathy.  Neurological: He is alert and oriented to person, place, and time. No cranial nerve deficit.  No dysarthria, no aphasia  Skin: Skin is warm and dry. No rash noted. No erythema.  Psychiatric: He has a normal mood and affect. His behavior is normal. Judgment and thought content normal.    LABORATORY PANEL:   CBC Recent Labs  Lab 09/08/19 1952  WBC 6.2  HGB 9.7*  HCT 28.9*  PLT 118*   ------------------------------------------------------------------------------------------------------------------  Chemistries  Recent Labs  Lab 09/08/19 1952  NA 134*  K 3.7  CL 100  CO2 24  GLUCOSE 106*  BUN 27*  CREATININE 2.06*  CALCIUM 8.7*   ------------------------------------------------------------------------------------------------------------------  Cardiac Enzymes No results for input(s): TROPONINI in the last 168 hours. ------------------------------------------------------------------------------------------------------------------  RADIOLOGY:  Dg Chest Portable 1 View  Result Date: 09/08/2019 CLINICAL DATA:  Dyspnea with exertion. EXAM: PORTABLE CHEST 1 VIEW COMPARISON:  08/21/2019 FINDINGS: Stable enlarged cardiac silhouette, post CABG changes and left subclavian pacer  and AICD leads. Stable prominence of the pulmonary vasculature and interstitial markings. Interval small to moderate-sized right pleural effusion. No acute bony abnormality. IMPRESSION: 1. Interval small to moderate-sized right pleural effusion. 2. Stable cardiomegaly, pulmonary vascular congestion and mild chronic interstitial lung disease. Electronically Signed   By: Claudie Revering M.D.   On: 09/08/2019 20:19    EKG:   Orders placed or performed during the hospital encounter of 09/08/19  . EKG 12-Lead  . EKG 12-Lead  . ED EKG  . ED  EKG    IMPRESSION AND PLAN:  Principal Problem:   Acute on chronic systolic CHF (congestive heart failure) ([place]) -patient was given diuresis in the ED with IV Lasix.  His blood pressure is low end normotensive at this time.  We will admit him to the telemetry floor with cardiology consult.  Further diuresis as his blood pressure tolerates tonight.  Continue other home meds Active Problems:   COPD (chronic obstructive pulmonary disease) ([place]) -continue home meds   Atrial fibrillation ([place]) -continue home rate controlling medications as well as anticoagulation   Sleep apnea, obstructive -BiPAP nightly   Chronic renal insufficiency -at baseline, avoid nephrotoxins and monitor   Cirrhosis, cryptogenic (HCC) -avoid hepatotoxins and monitor  Chart review performed and case discussed with ED provider. Labs, imaging and/or ECG reviewed by provider and discussed with patient/family. Management plans discussed with the patient and/or family.  COVID-19 status: Pending  DVT PROPHYLAXIS: Systemic anticoagulation  GI PROPHYLAXIS:  PPI  ADMISSION STATUS: Inpatient     CODE STATUS: Full Code Status History    Date Active Date Inactive Code Status Order ID Comments User Context   08/10/2019 0054 08/14/2019 1845 Full Code [id number]  Lance Coon, MD Inpatient   01/16/2016 1544 01/19/2016 1726 Full Code [id number]  Theodoro Grist, MD Inpatient   Advance Care Planning  Activity    Advance Directive Documentation     Most Recent Value  Type of Advance Directive  Healthcare Power of [place], Living will  Pre-existing out of facility DNR order (yellow form or pink MOST form)  -  "MOST" Form in Place?  -      TOTAL TIME TAKING CARE OF THIS PATIENT: 45 minutes.   This patient was evaluated in the context of the global COVID-19 pandemic, which necessitated consideration that the patient might be at risk for infection with the SARS-CoV-2 virus that causes COVID-19. Institutional protocols and algorithms that pertain to the evaluation of patients at risk for COVID-19 are in a state of rapid change based on information released by regulatory bodies including the CDC and federal and state organizations. These policies and algorithms were followed to the best of this provider's knowledge to date during the patient's care at this facility.  Ethlyn Daniels 09/08/2019, 8:57 PM  [facility]  Office  [phone]  CC: Primary care physician; Crecencio Mc, MD  Note:  This document was prepared using Dragon voice recognition software and may include unintentional dictation errors.

## 2019-09-08 NOTE — Telephone Encounter (Signed)
Patient's daughter called answering service. States last week father had ablation done (they ended up calling EMS when he was very short of breath with walking). He had ablation following this.   Was told by cardiologist per daughter to stop the metolazone. Cardiologist thought patient was on spironolactone, which he had not been taking, but started after visit. He has been on torsemide 60mg  in am and 40mg  in pm.   He has had a 5lb weight gain in 2 days. Can't make it from chair to bathroom.   No heart racing. Not sure pulse and no bp cuff at home to check.   I advised since just recently post op ablation they call cardiologist (which they had not done yet). I have sent them message through mychart to reach me if they have any difficulty contacting him so that I can further advise.

## 2019-09-09 ENCOUNTER — Inpatient Hospital Stay: Payer: Medicare Other

## 2019-09-09 LAB — URINALYSIS, ROUTINE W REFLEX MICROSCOPIC
Bilirubin Urine: NEGATIVE
Glucose, UA: NEGATIVE mg/dL
Hgb urine dipstick: NEGATIVE
Ketones, ur: NEGATIVE mg/dL
Leukocytes,Ua: NEGATIVE
Nitrite: NEGATIVE
Protein, ur: NEGATIVE mg/dL
Specific Gravity, Urine: 1.006 (ref 1.005–1.030)
pH: 7 (ref 5.0–8.0)

## 2019-09-09 LAB — CBC
HCT: 27.2 % — ABNORMAL LOW (ref 39.0–52.0)
Hemoglobin: 9 g/dL — ABNORMAL LOW (ref 13.0–17.0)
MCH: 31.1 pg (ref 26.0–34.0)
MCHC: 33.1 g/dL (ref 30.0–36.0)
MCV: 94.1 fL (ref 80.0–100.0)
Platelets: 100 10*3/uL — ABNORMAL LOW (ref 150–400)
RBC: 2.89 MIL/uL — ABNORMAL LOW (ref 4.22–5.81)
RDW: 14.2 % (ref 11.5–15.5)
WBC: 4.8 10*3/uL (ref 4.0–10.5)
nRBC: 0 % (ref 0.0–0.2)

## 2019-09-09 LAB — BASIC METABOLIC PANEL
Anion gap: 7 (ref 5–15)
BUN: 26 mg/dL — ABNORMAL HIGH (ref 8–23)
CO2: 27 mmol/L (ref 22–32)
Calcium: 8.4 mg/dL — ABNORMAL LOW (ref 8.9–10.3)
Chloride: 101 mmol/L (ref 98–111)
Creatinine, Ser: 1.9 mg/dL — ABNORMAL HIGH (ref 0.61–1.24)
GFR calc Af Amer: 39 mL/min — ABNORMAL LOW (ref >60)
GFR calc non Af Amer: 33 mL/min — ABNORMAL LOW (ref >60)
Glucose, Bld: 106 mg/dL — ABNORMAL HIGH (ref 70–99)
Potassium: 3.6 mmol/L (ref 3.5–5.1)
Sodium: 135 mmol/L (ref 135–145)

## 2019-09-09 LAB — PROTEIN / CREATININE RATIO, URINE
Creatinine, Urine: 50 mg/dL
Total Protein, Urine: <6 mg/dL

## 2019-09-09 LAB — PROTIME-INR
INR: 1.9 — ABNORMAL HIGH (ref 0.8–1.2)
Prothrombin Time: 21.3 seconds — ABNORMAL HIGH (ref 11.4–15.2)

## 2019-09-09 MED ORDER — VITAMIN B-12 1000 MCG PO TABS
1000.0000 ug | ORAL_TABLET | Freq: Every morning | ORAL | Status: DC
  Administered 2019-09-09 – 2019-09-13 (×5): 1000 ug via ORAL
  Filled 2019-09-09 (×5): qty 1

## 2019-09-09 MED ORDER — HEPARIN (PORCINE) 25000 UT/250ML-% IV SOLN
10.0000 [IU]/kg/h | INTRAVENOUS | Status: DC
  Administered 2019-09-09: 13:00:00 10 [IU]/kg/h via INTRAVENOUS
  Filled 2019-09-09: qty 250

## 2019-09-09 MED ORDER — SODIUM CHLORIDE 0.9% FLUSH
3.0000 mL | Freq: Two times a day (BID) | INTRAVENOUS | Status: DC
  Administered 2019-09-09 – 2019-09-12 (×5): 3 mL via INTRAVENOUS

## 2019-09-09 MED ORDER — FUROSEMIDE 10 MG/ML IJ SOLN
80.0000 mg | Freq: Two times a day (BID) | INTRAMUSCULAR | Status: DC
  Administered 2019-09-09: 17:00:00 80 mg via INTRAVENOUS
  Filled 2019-09-09: qty 8

## 2019-09-09 MED ORDER — POTASSIUM CHLORIDE CRYS ER 20 MEQ PO TBCR
20.0000 meq | EXTENDED_RELEASE_TABLET | Freq: Every day | ORAL | Status: DC
  Administered 2019-09-09 – 2019-09-13 (×5): 20 meq via ORAL
  Filled 2019-09-09 (×5): qty 1

## 2019-09-09 MED ORDER — COLCHICINE 0.6 MG PO TABS
0.6000 mg | ORAL_TABLET | Freq: Every day | ORAL | Status: DC
  Administered 2019-09-09 – 2019-09-13 (×4): 0.6 mg via ORAL
  Filled 2019-09-09 (×5): qty 1

## 2019-09-09 MED ORDER — ALLOPURINOL 100 MG PO TABS
100.0000 mg | ORAL_TABLET | Freq: Every day | ORAL | Status: DC
  Administered 2019-09-09 – 2019-09-13 (×5): 100 mg via ORAL
  Filled 2019-09-09 (×5): qty 1

## 2019-09-09 MED ORDER — FLUTICASONE PROPIONATE 50 MCG/ACT NA SUSP
2.0000 | Freq: Every day | NASAL | Status: DC
  Administered 2019-09-09 – 2019-09-13 (×5): 2 via NASAL
  Filled 2019-09-09: qty 16

## 2019-09-09 MED ORDER — HEPARIN (PORCINE) 25000 UT/250ML-% IV SOLN
1550.0000 [IU]/h | INTRAVENOUS | Status: DC
  Administered 2019-09-09: 1550 [IU]/h via INTRAVENOUS
  Filled 2019-09-09: qty 250

## 2019-09-09 NOTE — Progress Notes (Signed)
Advanced Care Plan.  Purpose of Encounter: CODE STATUS. Parties in Attendance: The patient, his daughter and me. Patient's Decisional Capacity: Yes. Medical Story: Rick Gilmore  is a 78 y.o. male  with history of CHF, A. fib, CAD, prostate cancer, COPD, hypertension, OSA and etc.  The patient is admitted for acute on chronic systolic CHF.  I discussed with the patient about his current condition, prognosis and CODE STATUS.  The patient does want to be resuscitated and intubated if he has cardiopulmonary arrest. Plan:  Code Status: Full code. Time spent discussing advance care planning: 18 minutes.

## 2019-09-09 NOTE — Progress Notes (Signed)
Consult was placed to IV Team to restart iv; pt on heparin drip;  Pt is not currently in his room; per family member, "he has gone for an ultrasound;"   Will have staff re-enter consult when he returns;

## 2019-09-09 NOTE — Consult Note (Signed)
Reason for Consult: Shortness of breath dyspnea heart failure Referring Physician: Dr. Lance Coon hospitalist Dr. Deborra Medina primary Cardiologist Dr. Aniceto Boss Rick Gilmore is an 78 y.o. male.  HPI: Patient history of multivessel coronary disease coronary bypass surgery lower extremity edema AICD in place ischemic cardiomyopathy sustained ventricular tachycardia requiring ablation at [facility] recently patient has had renal insufficiency and had his heart failure medications adjusted and discontinued including Entresto he started having dyspnea mostly with exertion over the last few days unresponsive to p.o. torsemide so the patient finally came into the emergency room for further assessment evaluation no AICD discharges no chest pain  Past Medical History:  Diagnosis Date  . AICD (automatic cardioverter/defibrillator) present   . Anemia   . Arrhythmia    a fib  . Atrial fibrillation ([place])    on Coumadin  . CAD (coronary artery disease)   . Cancer [facility]) 2001   Prostate, XRT and implant  . CHF (congestive heart failure) ([place])   . COPD (chronic obstructive pulmonary disease) ([place])   . Erectile dysfunction   . GERD (gastroesophageal reflux disease)   . Hyperlipidemia   . Hypertension   . Ischemic cardiomyopathy    s/p AICD/pacer 2009, replaced 2010 [facility]  . MI (myocardial infarction) ([place]) 1988  . Personal history of prostate cancer 2001   s/p XRT and implant ([place])  . Prostate cancer ([place])   . Sleep apnea, obstructive    uses CPAP nightly  . Thrombocytopenia ([place])   . Tubular adenoma    colon polyp  . Vitamin B 12 deficiency     Past Surgical History:  Procedure Laterality Date  . CARDIAC CATHETERIZATION    . COLONOSCOPY WITH PROPOFOL [name] 05/30/2015   Procedure: COLONOSCOPY WITH PROPOFOL;  Surgeon: Manya Silvas, MD;  Location: [facility] ENDOSCOPY;  Service: Endoscopy;  Laterality: [name];  . COLONOSCOPY WITH PROPOFOL [name] 07/16/2019   Procedure: COLONOSCOPY WITH PROPOFOL;  Surgeon:  Toledo, Benay Pike, MD;  Location: [facility] ENDOSCOPY;  Service: Gastroenterology;  Laterality: [name];  . CORONARY ARTERY BYPASS GRAFT  1988  . ESOPHAGOGASTRODUODENOSCOPY [name] 05/30/2015   Procedure: ESOPHAGOGASTRODUODENOSCOPY (EGD);  Surgeon: Manya Silvas, MD;  Location: [facility] ENDOSCOPY;  Service: Endoscopy;  Laterality: [name];  . ESOPHAGOGASTRODUODENOSCOPY (EGD) WITH PROPOFOL [name] 07/16/2019   Procedure: ESOPHAGOGASTRODUODENOSCOPY (EGD) WITH PROPOFOL;  Surgeon: Toledo, Benay Pike, MD;  Location: [facility] ENDOSCOPY;  Service: Gastroenterology;  Laterality: [name];  . IMPLANTABLE CARDIOVERTER DEFIBRILLATOR GENERATOR CHANGE  April 2014   Bi V RRR  . INSERT / REPLACE / REMOVE PACEMAKER  2014  . VASECTOMY      Family History  Problem Relation Age of Onset  . Hypertension Mother   . Hypertension Father   . Cancer Father        prostate, throat  . Emphysema Father   . Heart disease Maternal Grandmother   . Heart disease Paternal Grandmother     Social History:  reports that he has never smoked. He has never used smokeless tobacco. He reports current alcohol use of about 4.0 standard drinks of alcohol per week. He reports that he does not use drugs.  Allergies: No Known Allergies  Medications: I have reviewed the patient's current medications.  Results for orders placed or performed during the hospital encounter of 09/08/19 (from the past 48 hour(s))  CBC with Differential     Status: Abnormal   Collection Time: 09/08/19  7:52 PM  Result Value Ref Range   WBC 6.2 4.0 - 10.5 K/uL  RBC 3.07 (L) 4.22 - 5.81 MIL/uL   Hemoglobin 9.7 (L) 13.0 - 17.0 g/dL   HCT 28.9 (L) 39.0 - 52.0 %   MCV 94.1 80.0 - 100.0 fL   MCH 31.6 26.0 - 34.0 pg   MCHC 33.6 30.0 - 36.0 g/dL   RDW 14.1 11.5 - 15.5 %   Platelets 118 (L) 150 - 400 K/uL    Comment: REPEATED TO VERIFY Immature Platelet Fraction may be clinically indicated, consider ordering this additional test [id number]    nRBC 0.0 0.0 - 0.2 %   Neutrophils  Relative % 77 %   Neutro Abs 4.9 1.7 - 7.7 K/uL   Lymphocytes Relative 12 %   Lymphs Abs 0.7 0.7 - 4.0 K/uL   Monocytes Relative 8 %   Monocytes Absolute 0.5 0.1 - 1.0 K/uL   Eosinophils Relative 1 %   Eosinophils Absolute 0.1 0.0 - 0.5 K/uL   Basophils Relative 1 %   Basophils Absolute 0.0 0.0 - 0.1 K/uL   Immature Granulocytes 1 %   Abs Immature Granulocytes 0.06 0.00 - 0.07 K/uL    Comment: Performed at [facility], [address] [place], [place] [address]  Basic metabolic panel     Status: Abnormal   Collection Time: 09/08/19  7:52 PM  Result Value Ref Range   Sodium 134 (L) 135 - 145 mmol/L   Potassium 3.7 3.5 - 5.1 mmol/L   Chloride 100 98 - 111 mmol/L   CO2 24 22 - 32 mmol/L   Glucose, Bld 106 (H) 70 - 99 mg/dL   BUN 27 (H) 8 - 23 mg/dL   Creatinine, Ser 2.06 (H) 0.61 - 1.24 mg/dL   Calcium 8.7 (L) 8.9 - 10.3 mg/dL   GFR calc non Af Amer 30 (L) >60 mL/min   GFR calc Af Amer 35 (L) >60 mL/min   Anion gap 10 5 - 15    Comment: Performed at [facility], [place], [place] [address]  Troponin I (High Sensitivity)     Status: Abnormal   Collection Time: 09/08/19  7:52 PM  Result Value Ref Range   Troponin I (High Sensitivity) 37 (H) <18 ng/L    Comment: (NOTE) Elevated high sensitivity troponin I (hsTnI) values and significant  changes across serial measurements may suggest ACS but many other  chronic and acute conditions are known to elevate hsTnI results.  Refer to the "Links" section for chest pain algorithms and additional  guidance. Performed at [facility], [address] [place], [place] [address]   Brain natriuretic peptide     Status: Abnormal   Collection Time: 09/08/19  7:52 PM  Result Value Ref Range   B Natriuretic Peptide 711.0 (H) 0.0 - 100.0 pg/mL    Comment: Performed at [facility], [address] [place], [place] [address]  Protime-INR     Status: Abnormal   Collection Time: 09/08/19   7:52 PM  Result Value Ref Range   Prothrombin Time 20.7 (H) 11.4 - 15.2 seconds   INR 1.8 (H) 0.8 - 1.2    Comment: (NOTE) INR goal varies based on device and disease states. Performed at [facility], [address] [place], [place] [address]   APTT     Status: Abnormal   Collection Time: 09/08/19  7:52 PM  Result Value Ref Range   aPTT 48 (H) 24 - 36 seconds    Comment:        IF BASELINE  aPTT IS ELEVATED, SUGGEST PATIENT RISK ASSESSMENT BE USED TO DETERMINE APPROPRIATE ANTICOAGULANT THERAPY. Performed at [facility], [address] [place], [place] [address]   SARS Coronavirus 2 [facility] order, Performed in [facility] hospital lab) Nasopharyngeal Nasopharyngeal Swab     Status: None   Collection Time: 09/08/19  7:57 PM   Specimen: Nasopharyngeal Swab  Result Value Ref Range   SARS Coronavirus 2 NEGATIVE NEGATIVE    Comment: (NOTE) If result is NEGATIVE SARS-CoV-2 target nucleic acids are NOT DETECTED. The SARS-CoV-2 RNA is generally detectable in upper and lower  respiratory specimens during the acute phase of infection. The lowest  concentration of SARS-CoV-2 viral copies this assay can detect is 250  copies / mL. A negative result does not preclude SARS-CoV-2 infection  and should not be used as the sole basis for treatment or other  patient management decisions.  A negative result may occur with  improper specimen collection / handling, submission of specimen other  than nasopharyngeal swab, presence of viral mutation(s) within the  areas targeted by this assay, and inadequate number of viral copies  (<250 copies / mL). A negative result must be combined with clinical  observations, patient history, and epidemiological information. If result is POSITIVE SARS-CoV-2 target nucleic acids are DETECTED. The SARS-CoV-2 RNA is generally detectable in upper and lower  respiratory specimens dur ing the acute phase of infection.  Positive  results are  indicative of active infection with SARS-CoV-2.  Clinical  correlation with patient history and other diagnostic information is  necessary to determine patient infection status.  Positive results do  not rule out bacterial infection or co-infection with other viruses. If result is PRESUMPTIVE POSTIVE SARS-CoV-2 nucleic acids MAY BE PRESENT.   A presumptive positive result was obtained on the submitted specimen  and confirmed on repeat testing.  While 2019 novel coronavirus  (SARS-CoV-2) nucleic acids may be present in the submitted sample  additional confirmatory testing may be necessary for epidemiological  and / or clinical management purposes  to differentiate between  SARS-CoV-2 and other Sarbecovirus currently known to infect humans.  If clinically indicated additional testing with an alternate test  methodology [phone]) is advised. The SARS-CoV-2 RNA is generally  detectable in upper and lower respiratory sp ecimens during the acute  phase of infection. The expected result is Negative. Fact Sheet for Patients:  StrictlyIdeas.no Fact Sheet for Healthcare Providers: BankingDealers.co.za This test is not yet approved or cleared by the [place] FDA and has been authorized for detection and/or diagnosis of SARS-CoV-2 by FDA under an Emergency Use Authorization (EUA).  This EUA will remain in effect (meaning this test can be used) for the duration of the COVID-19 declaration under Section 564(b)(1) of the Act, 21 U.S.C. section 360bbb-3(b)(1), unless the authorization is terminated or revoked sooner. Performed at [facility], [place], [place] [address]   Troponin I (High Sensitivity)     Status: Abnormal   Collection Time: 09/08/19  9:33 PM  Result Value Ref Range   Troponin I (High Sensitivity) 35 (H) <18 ng/L    Comment: (NOTE) Elevated high sensitivity troponin I (hsTnI) values and significant   changes across serial measurements may suggest ACS but many other  chronic and acute conditions are known to elevate hsTnI results.  Refer to the "Links" section for chest pain algorithms and additional  guidance. Performed at [facility], [address] [place], [place] [address]   Basic metabolic panel  Status: Abnormal   Collection Time: 09/09/19  6:47 AM  Result Value Ref Range   Sodium 135 135 - 145 mmol/L   Potassium 3.6 3.5 - 5.1 mmol/L   Chloride 101 98 - 111 mmol/L   CO2 27 22 - 32 mmol/L   Glucose, Bld 106 (H) 70 - 99 mg/dL   BUN 26 (H) 8 - 23 mg/dL   Creatinine, Ser 1.90 (H) 0.61 - 1.24 mg/dL   Calcium 8.4 (L) 8.9 - 10.3 mg/dL   GFR calc non Af Amer 33 (L) >60 mL/min   GFR calc Af Amer 39 (L) >60 mL/min   Anion gap 7 5 - 15    Comment: Performed at [facility], [address] [place], [place] [address]  CBC     Status: Abnormal   Collection Time: 09/09/19  6:47 AM  Result Value Ref Range   WBC 4.8 4.0 - 10.5 K/uL   RBC 2.89 (L) 4.22 - 5.81 MIL/uL   Hemoglobin 9.0 (L) 13.0 - 17.0 g/dL   HCT 27.2 (L) 39.0 - 52.0 %   MCV 94.1 80.0 - 100.0 fL   MCH 31.1 26.0 - 34.0 pg   MCHC 33.1 30.0 - 36.0 g/dL   RDW 14.2 11.5 - 15.5 %   Platelets 100 (L) 150 - 400 K/uL    Comment: Immature Platelet Fraction may be clinically indicated, consider ordering this additional test [id number]    nRBC 0.0 0.0 - 0.2 %    Comment: Performed at [facility], [address], [place], [place] [address]    Dg Chest Portable 1 View  Result Date: 09/08/2019 CLINICAL DATA:  Dyspnea with exertion. EXAM: PORTABLE CHEST 1 VIEW COMPARISON:  08/21/2019 FINDINGS: Stable enlarged cardiac silhouette, post CABG changes and left subclavian pacer and AICD leads. Stable prominence of the pulmonary vasculature and interstitial markings. Interval small to moderate-sized right pleural effusion. No acute bony abnormality. IMPRESSION: 1. Interval small to moderate-sized right  pleural effusion. 2. Stable cardiomegaly, pulmonary vascular congestion and mild chronic interstitial lung disease. Electronically Signed   By: Claudie Revering M.D.   On: 09/08/2019 20:19    Review of Systems  Constitutional: Positive for malaise/fatigue.  HENT: Positive for congestion.   Eyes: Negative.   Respiratory: Positive for shortness of breath.   Cardiovascular: Positive for leg swelling and PND.  Gastrointestinal: Negative.   Genitourinary: Negative.   Musculoskeletal: Negative.   Skin: Negative.   Neurological: Positive for weakness.  Endo/Heme/Allergies: Negative.   Psychiatric/Behavioral: Negative.    Blood pressure (!) 104/56, pulse 69, temperature 97.8 F (36.6 C), temperature source Oral, resp. rate 18, height 6\' 2"  (1.88 m), weight 117 kg, SpO2 96 %. Physical Exam  Nursing note and vitals reviewed. Constitutional: He is oriented to person, place, and time. He appears well-developed and well-nourished.  HENT:  Head: Normocephalic and atraumatic.  Eyes: Pupils are equal, round, and reactive to light. Conjunctivae and EOM are normal.  Neck: Normal range of motion. Neck supple.  Cardiovascular: Normal rate and regular rhythm. Exam reveals gallop.  Murmur heard. Respiratory: Effort normal. He has decreased breath sounds. He has rhonchi. He has rales.  GI: Soft. Bowel sounds are normal.  Musculoskeletal:        General: Edema present.  Neurological: He is alert and oriented to person, place, and time. He has normal reflexes.  Skin: Skin is warm and dry.  Psychiatric: He has a normal mood and affect.    Assessment/Plan: Dyspnea Congestive heart failure  Cardiomyopathy AICD in place Recent VT ablation at [facility] Renal insufficiency stage III Obesity Pleural effusion Atrial fibrillation on anticoagulation Multivessel coronary disease COPD GERD Hyperlipidemia . Plan Agree with admit Follow-up EKGs troponin Follow-up chest x-ray Restart Entresto for heart  failure Aggressive intravenous diuretic therapy Consult nephrology for renal insufficiency Hold Eliquis for possible invasive procedure and switch to IV heparin Consider thoracentesis if IV diuresis is ineffective for his dyspnea Continue inhalers as necessary Continue amiodarone for rhythm management and control' Agree with Protonix for reflux symptoms   Dwayne D Callwood 09/09/2019, 12:44 PM

## 2019-09-09 NOTE — Progress Notes (Signed)
Rick Gilmore at [place] NAME: Rick Gilmore    MR#:  [id number]  DATE OF BIRTH:  19-May-1941  SUBJECTIVE:  CHIEF COMPLAINT:   Chief Complaint  Patient presents with  . Shortness of Breath   The patient has better shortness of breath and leg swelling. REVIEW OF SYSTEMS:  Review of Systems  Constitutional: Negative for chills, fever and malaise/fatigue.  HENT: Negative for sore throat.   Eyes: Negative for blurred vision and double vision.  Respiratory: Positive for shortness of breath. Negative for cough, hemoptysis, sputum production, wheezing and stridor.   Cardiovascular: Positive for leg swelling. Negative for chest pain, palpitations and orthopnea.  Gastrointestinal: Negative for abdominal pain, blood in stool, diarrhea, melena, nausea and vomiting.  Genitourinary: Negative for dysuria, flank pain and hematuria.  Musculoskeletal: Negative for back pain and joint pain.  Skin: Negative for rash.  Neurological: Negative for dizziness, sensory change, focal weakness, seizures, loss of consciousness, weakness and headaches.  Endo/Heme/Allergies: Negative for polydipsia.  Psychiatric/Behavioral: Negative for depression. The patient is not nervous/anxious.     DRUG ALLERGIES:  No Known Allergies VITALS:  Blood pressure (!) 104/56, pulse 69, temperature 97.8 F (36.6 C), temperature source Oral, resp. rate 18, height 6\' 2"  (1.88 m), weight 117 kg, SpO2 96 %. PHYSICAL EXAMINATION:  Physical Exam Constitutional:      General: He is not in acute distress.    Appearance: Normal appearance.  HENT:     Head: Normocephalic.     Mouth/Throat:     Mouth: Mucous membranes are moist.  Eyes:     General: No scleral icterus.    Conjunctiva/sclera: Conjunctivae normal.     Pupils: Pupils are equal, round, and reactive to light.  Neck:     Musculoskeletal: Normal range of motion and neck supple.     Vascular: No JVD.     Trachea: No tracheal  deviation.  Cardiovascular:     Rate and Rhythm: Normal rate and regular rhythm.     Heart sounds: Normal heart sounds. No murmur. No gallop.   Pulmonary:     Effort: Pulmonary effort is normal. No respiratory distress.     Breath sounds: Rales present. No wheezing.  Abdominal:     General: Bowel sounds are normal. There is no distension.     Palpations: Abdomen is soft.     Tenderness: There is no abdominal tenderness. There is no rebound.  Musculoskeletal: Normal range of motion.        General: No tenderness.     Right lower leg: Edema present.     Left lower leg: Edema present.  Skin:    Findings: No erythema or rash.  Neurological:     General: No focal deficit present.     Mental Status: He is alert and oriented to person, place, and time.     Cranial Nerves: No cranial nerve deficit.  Psychiatric:        Mood and Affect: Mood normal.    LABORATORY PANEL:  Male CBC Recent Labs  Lab 09/09/19 0647  WBC 4.8  HGB 9.0*  HCT 27.2*  PLT 100*   ------------------------------------------------------------------------------------------------------------------ Chemistries  Recent Labs  Lab 09/09/19 0647  NA 135  K 3.6  CL 101  CO2 27  GLUCOSE 106*  BUN 26*  CREATININE 1.90*  CALCIUM 8.4*   RADIOLOGY:  Dg Chest Portable 1 View  Result Date: 09/08/2019 CLINICAL DATA:  Dyspnea with exertion. EXAM: PORTABLE CHEST 1 VIEW  COMPARISON:  08/21/2019 FINDINGS: Stable enlarged cardiac silhouette, post CABG changes and left subclavian pacer and AICD leads. Stable prominence of the pulmonary vasculature and interstitial markings. Interval small to moderate-sized right pleural effusion. No acute bony abnormality. IMPRESSION: 1. Interval small to moderate-sized right pleural effusion. 2. Stable cardiomegaly, pulmonary vascular congestion and mild chronic interstitial lung disease. Electronically Signed   By: Claudie Revering M.D.   On: 09/08/2019 20:19   ASSESSMENT AND PLAN:    Acute  on chronic systolic CHF (congestive heart failure) ejection fraction 30%. He was treated with IV Lasix and changed to torsemide.  Hold torsemide and continue IV Lasix 80 mg twice daily per Dr. Clayborn Bigness. continue spironolactone, Toprol and Entresto.  Follow-up cardiology consult.   Pleural effusion. Interval small to moderate-sized right pleural effusion. Hold Eliquis for possible thoracentesis per Dr. Clayborn Bigness.    COPD (chronic obstructive pulmonary disease) ([place]) -continue home meds   Atrial fibrillation ([place]) -continue amiodarone and Toprol, hold Eliquis for possible thoracentesis.  Started heparin drip.   Sleep apnea, obstructive -BiPAP nightly   CKD stage III-at baseline, avoid nephrotoxins and follow-up BMP.   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 Anemia of chronic disease.  Stable.  I discussed with Dr. Clayborn Bigness. All the records are reviewed and case discussed with Care Management/Social Worker. Management plans discussed with the patient, his daughter and they are in agreement.  CODE STATUS: Full Code  TOTAL TIME TAKING CARE OF THIS PATIENT: 35 minutes.   More than 50% of the time was spent in counseling/coordination of care: YES  POSSIBLE D/C IN 2 DAYS, DEPENDING ON CLINICAL CONDITION.   Demetrios Loll M.D on 09/09/2019 at 12:21 PM  Between 7am to 6pm - Pager - [phone]  After 6pm go to [email or web address] - [facility]

## 2019-09-09 NOTE — Progress Notes (Signed)
[place] for heparin drip management  Indication: atrial fibrillation  No Known Allergies  Patient Measurements: Height: 6\' 2"  (188 cm) Weight: 258 lb (117 kg) IBW/kg (Calculated) : 82.2 Heparin Dosing Weight: 108kg   Vital Signs: Temp: 97.8 F (36.6 C) (10/04 0745) Temp Source: Oral (10/04 0745) BP: 104/56 (10/04 0745) Pulse Rate: 69 (10/04 0745)  Labs: Recent Labs    09/08/19 1952 09/08/19 2133 09/09/19 0647  HGB 9.7*  --  9.0*  HCT 28.9*  --  27.2*  PLT 118*  --  100*  APTT 48*  --   --   LABPROT 20.7*  --   --   INR 1.8*  --   --   CREATININE 2.06*  --  1.90*  TROPONINIHS 37* 35*  --     Estimated Creatinine Clearance: 44.3 mL/min (A) (by C-G formula based on SCr of 1.9 mg/dL (H)).   Medications:  Scheduled:  . allopurinol  100 mg Oral Daily  . amiodarone  200 mg Oral Daily  . colchicine  0.6 mg Oral Daily  . escitalopram  10 mg Oral Daily  . fluticasone  2 spray Each Nare Daily  . furosemide  80 mg Intravenous BID  . isosorbide mononitrate  30 mg Oral Daily  . metoprolol succinate  75 mg Oral Daily  . montelukast  10 mg Oral QHS  . pantoprazole  40 mg Oral Daily  . potassium chloride SA  20 mEq Oral Daily  . pravastatin  80 mg Oral q1800  . sacubitril-valsartan  1 tablet Oral BID  . spironolactone  25 mg Oral Daily  . umeclidinium-vilanterol  1 puff Inhalation Daily  . vitamin B-12  1,000 mcg Oral q morning - 10a   Infusions:  . heparin      Assessment: Pharmacy consulted for heparin drip management for 78 yo male on apixaban as an outpatient for atrial fibrillation. Patient with past medical history significant for AICD, anemia, arrhythmia, atrial fibrillation, CAD, prostate cancer, CHF, COPD, GERD, hyperlipidemia, hypertension, MI, and thrombocytopenia. Patient received apixaban 5mg  at 1036 on 10/4. Patient being transitioned to heparin for possible upcoming procedure. Heparin started at 1324; initially  entered without consult.   Goal of Therapy:  Heparin level 0.3-0.7 units/ml aPTT 66-102 seconds Monitor platelets by anticoagulation protocol: Yes   Plan:  Will discontinue current heparin drip. Currently patient with IV team consult for IV placement.   Will order heparin to start at 2300 with no bolus. Initial rate 1550 units/hr. Will order aPTT at 0700. Will order CBC with am labs.  Due to recent apixaban administration patient will need to be managed with aPTT and anti-Xa levels correlate. Expect this will take 3-4 days.   Pharmacy will continue to monitor and adjust per consult.   Simpson,Michael L 09/09/2019,3:36 PM

## 2019-09-09 NOTE — Plan of Care (Signed)
  Problem: Education: Goal: Knowledge of General Education information will improve Description Including pain rating scale, medication(s)/side effects and non-pharmacologic comfort measures Outcome: Progressing   Problem: Education: Goal: Ability to demonstrate management of disease process will improve Outcome: Progressing   Problem: Cardiac: Goal: Ability to achieve and maintain adequate cardiopulmonary perfusion will improve Outcome: Progressing   

## 2019-09-09 NOTE — Consult Note (Signed)
[facility]  CONSULT NOTE    Date: 09/09/2019                  Patient Name:  Rick Gilmore  MRN: [id number]  DOB: 1941-04-21  Age / Sex: 78 y.o., male         PCP: Crecencio Mc, MD                 Service Requesting Consult: Dr. Bridgett Larsson                 Reason for Consult: Acute renal failure            History of Present Illness: [name] has had a complicated last few weeks. Patient's daughter is at bedside who assists with history taking. Patient was recently discharged from [facility] where she had an ablation and adjustment in his medications. Patient has been having increasing peripheral edema and shortness of breath since then.   Medications: Outpatient medications: Medications Prior to Admission  Medication Sig Dispense Refill Last Dose  . albuterol (PROVENTIL HFA;VENTOLIN HFA) 108 (90 Base) MCG/ACT inhaler Inhale 2 puffs into the lungs every 6 (six) hours as needed for wheezing or shortness of breath. 1 Inhaler 1   . allopurinol (ZYLOPRIM) 100 MG tablet Take 1 tablet (100 mg total) by mouth daily. 90 tablet 3   . amiodarone (PACERONE) 200 MG tablet Take 1 tablet (200 mg total) by mouth 2 (two) times daily for 14 days. 28 tablet 0   . amiodarone (PACERONE) 200 MG tablet Take 1 tablet (200 mg total) by mouth daily. 30 tablet 0   . ANORO ELLIPTA 62.5-25 MCG/INH AEPB USE 1 INHALATION DAILY (Patient taking differently: Inhale 1 puff into the lungs daily. ) 60 each 0   . apixaban (ELIQUIS) 5 MG TABS tablet Take 5 mg by mouth 2 (two) times daily.      . colchicine 0.6 MG tablet Take 0.6 mg by mouth daily.     . Cyanocobalamin 1000 MCG SUBL Place 1 tablet (1,000 mcg total) under the tongue every morning. 90 tablet 3   . ENTRESTO 24-26 MG TAKE 1 TABLET TWICE A DAY (Patient taking differently: Take 1 tablet by mouth 2 (two) times daily. ) 180 tablet 3   . escitalopram (LEXAPRO) 10 MG tablet Take 1 tablet (10 mg total) by mouth daily. 90 tablet 3   .  fluticasone (FLONASE) 50 MCG/ACT nasal spray USE 1 SPRAY IN EACH NOSTRIL DAILY (Patient taking differently: Place 2 sprays into both nostrils daily. ) 48 g 3   . isosorbide mononitrate (IMDUR) 30 MG 24 hr tablet Take 30 mg by mouth daily.  11   . metolazone (ZAROXOLYN) 2.5 MG tablet Take 1 tablet (2.5 mg total) by mouth 2 (two) times daily. 60 tablet 0   . metoprolol succinate (TOPROL-XL) 25 MG 24 hr tablet Take 3 tablets (75 mg total) by mouth daily. 30 tablet 9   . montelukast (SINGULAIR) 10 MG tablet Take 1 tablet (10 mg total) by mouth at bedtime. 90 tablet 3   . pantoprazole (PROTONIX) 40 MG tablet Take 1 tablet (40 mg total) by mouth daily. 90 tablet 3   . potassium chloride SA (K-DUR) 20 MEQ tablet Take 1 tablet (20 mEq total) by mouth daily. 90 tablet 3   . pravastatin (PRAVACHOL) 80 MG tablet Take 80 mg by mouth daily.     Marland Kitchen spironolactone (ALDACTONE) 25 MG tablet Take 25 mg by mouth  daily.     . torsemide (DEMADEX) 20 MG tablet TAKE 3 TABLETS IN THE MORNING AND 2 TABLETS IN THE EVENING (Patient taking differently: Take 40-60 mg by mouth 2 (two) times daily. 3 tablets (60mg ) in the morning and 2 tablets (40mg ) in the afternoon) 450 tablet 3     Current medications: Current Facility-Administered Medications  Medication Dose Route Frequency Provider Last Rate Last Dose  . acetaminophen (TYLENOL) tablet 650 mg  650 mg Oral Q6H PRN Lance Coon, MD       Or  . acetaminophen (TYLENOL) suppository 650 mg  650 mg Rectal Q6H PRN Lance Coon, MD      . allopurinol (ZYLOPRIM) tablet 100 mg  100 mg Oral Daily Demetrios Loll, MD   100 mg at 09/09/19 1046  . amiodarone (PACERONE) tablet 200 mg  200 mg Oral Daily Lance Coon, MD   200 mg at 09/09/19 1046  . colchicine tablet 0.6 mg  0.6 mg Oral Daily Demetrios Loll, MD   0.6 mg at 09/09/19 1046  . escitalopram (LEXAPRO) tablet 10 mg  10 mg Oral Daily Lance Coon, MD   10 mg at 09/09/19 1045  . fluticasone (FLONASE) 50 MCG/ACT nasal spray 2 spray  2  spray Each Nare Daily Demetrios Loll, MD   2 spray at 09/09/19 1044  . furosemide (LASIX) injection 80 mg  80 mg Intravenous BID Callwood, Dwayne D, MD      . heparin ADULT infusion 100 units/mL (25000 units/26mL sodium chloride 0.45%)  10 Units/kg/hr Intravenous Continuous Callwood, Dwayne D, MD   Stopped at 09/09/19 1336  . isosorbide mononitrate (IMDUR) 24 hr tablet 30 mg  30 mg Oral Daily Lance Coon, MD   30 mg at 09/09/19 1046  . metoprolol succinate (TOPROL-XL) 24 hr tablet 75 mg  75 mg Oral Daily Lance Coon, MD   75 mg at 09/09/19 1045  . montelukast (SINGULAIR) tablet 10 mg  10 mg Oral Corwin Levins, MD   10 mg at 09/08/19 2312  . ondansetron (ZOFRAN) tablet 4 mg  4 mg Oral Q6H PRN Lance Coon, MD       Or  . ondansetron [facility]) injection 4 mg  4 mg Intravenous Q6H PRN Lance Coon, MD      . pantoprazole (PROTONIX) EC tablet 40 mg  40 mg Oral Daily Lance Coon, MD   40 mg at 09/09/19 1045  . potassium chloride SA (KLOR-CON) CR tablet 20 mEq  20 mEq Oral Daily Demetrios Loll, MD   20 mEq at 09/09/19 1045  . pravastatin (PRAVACHOL) tablet 80 mg  80 mg Oral q1800 Lance Coon, MD      . sacubitril-valsartan (ENTRESTO) 24-26 mg per tablet  1 tablet Oral BID Lance Coon, MD   1 tablet at 09/09/19 1045  . spironolactone (ALDACTONE) tablet 25 mg  25 mg Oral Daily Lance Coon, MD   25 mg at 09/09/19 1046  . umeclidinium-vilanterol (ANORO ELLIPTA) 62.5-25 MCG/INH 1 puff  1 puff Inhalation Daily Lance Coon, MD   1 puff at 09/09/19 1044  . vitamin B-12 (CYANOCOBALAMIN) tablet 1,000 mcg  1,000 mcg Oral q morning - 10a Demetrios Loll, MD   1,000 mcg at 09/09/19 1045      Allergies: No Known Allergies    Past Medical History: Past Medical History:  Diagnosis Date  . AICD (automatic cardioverter/defibrillator) present   . Anemia   . Arrhythmia    a fib  . Atrial fibrillation ([place])    on  Coumadin  . CAD (coronary artery disease)   . Cancer [facility]) 2001   Prostate, XRT and  implant  . CHF (congestive heart failure) ([place])   . COPD (chronic obstructive pulmonary disease) ([place])   . Erectile dysfunction   . GERD (gastroesophageal reflux disease)   . Hyperlipidemia   . Hypertension   . Ischemic cardiomyopathy    s/p AICD/pacer 2009, replaced 2010 [facility]  . MI (myocardial infarction) ([place]) 1988  . Personal history of prostate cancer 2001   s/p XRT and implant ([place])  . Prostate cancer ([place])   . Sleep apnea, obstructive    uses CPAP nightly  . Thrombocytopenia ([place])   . Tubular adenoma    colon polyp  . Vitamin B 12 deficiency      Past Surgical History: Past Surgical History:  Procedure Laterality Date  . CARDIAC CATHETERIZATION    . COLONOSCOPY WITH PROPOFOL [name] 05/30/2015   Procedure: COLONOSCOPY WITH PROPOFOL;  Surgeon: Manya Silvas, MD;  Location: [facility] ENDOSCOPY;  Service: Endoscopy;  Laterality: [name];  . COLONOSCOPY WITH PROPOFOL [name] 07/16/2019   Procedure: COLONOSCOPY WITH PROPOFOL;  Surgeon: Toledo, Benay Pike, MD;  Location: [facility] ENDOSCOPY;  Service: Gastroenterology;  Laterality: [name];  . CORONARY ARTERY BYPASS GRAFT  1988  . ESOPHAGOGASTRODUODENOSCOPY [name] 05/30/2015   Procedure: ESOPHAGOGASTRODUODENOSCOPY (EGD);  Surgeon: Manya Silvas, MD;  Location: [facility] ENDOSCOPY;  Service: Endoscopy;  Laterality: [name];  . ESOPHAGOGASTRODUODENOSCOPY (EGD) WITH PROPOFOL [name] 07/16/2019   Procedure: ESOPHAGOGASTRODUODENOSCOPY (EGD) WITH PROPOFOL;  Surgeon: Toledo, Benay Pike, MD;  Location: [facility] ENDOSCOPY;  Service: Gastroenterology;  Laterality: [name];  . IMPLANTABLE CARDIOVERTER DEFIBRILLATOR GENERATOR CHANGE  April 2014   Bi V RRR  . INSERT / REPLACE / REMOVE PACEMAKER  2014  . VASECTOMY       Family History: Family History  Problem Relation Age of Onset  . Hypertension Mother   . Hypertension Father   . Cancer Father        prostate, throat  . Emphysema Father   . Heart disease Maternal Grandmother   . Heart disease Paternal Grandmother       Social History: Social History   Socioeconomic History  . Marital status: Divorced    Spouse name: Not on file  . Number of children: Not on file  . Years of education: Not on file  . Highest education level: Not on file  Occupational History  . Occupation: Retired  Scientific laboratory technician  . Financial resource strain: Not hard at all  . Food insecurity    Worry: Never true    Inability: Never true  . Transportation needs    Medical: No    Non-medical: No  Tobacco Use  . Smoking status: Never Smoker  . Smokeless tobacco: Never Used  Substance and Sexual Activity  . Alcohol use: Yes    Alcohol/week: 4.0 standard drinks    Types: 2 Standard drinks or equivalent, 2 Cans of beer per week    Comment: Mixed drinks  . Drug use: No  . Sexual activity: Never  Lifestyle  . Physical activity    Days per week: 0 days    Minutes per session: 0 min  . Stress: Not at all  Relationships  . Social connections    Talks on phone: More than three times a week    Gets together: Three times a week    Attends religious service: More than 4 times per year    Active member of club or organization: Yes  Attends meetings of clubs or organizations: More than 4 times per year    Relationship status: Divorced  . Intimate partner violence    Fear of current or ex partner: No    Emotionally abused: No    Physically abused: No    Forced sexual activity: No  Other Topics Concern  . Not on file  Social History Narrative  . Not on file     Review of Systems: Review of Systems  Constitutional: Negative.  Negative for chills, diaphoresis, fever, malaise/fatigue and weight loss.  HENT: Negative.  Negative for congestion, ear discharge, ear pain, hearing loss, nosebleeds, sinus pain, sore throat and tinnitus.   Eyes: Negative.  Negative for blurred vision, double vision, photophobia, pain, discharge and redness.  Respiratory: Positive for shortness of breath. Negative for cough, hemoptysis, sputum  production, wheezing and stridor.   Cardiovascular: Positive for palpitations and leg swelling. Negative for chest pain, orthopnea, claudication and PND.  Gastrointestinal: Negative.  Negative for abdominal pain, blood in stool, constipation, diarrhea, heartburn, melena, nausea and vomiting.  Genitourinary: Negative.  Negative for dysuria, flank pain, frequency, hematuria and urgency.  Musculoskeletal: Negative.  Negative for back pain, falls, joint pain, myalgias and neck pain.  Skin: Negative.  Negative for itching and rash.  Neurological: Negative.  Negative for dizziness, tingling, tremors, sensory change, speech change, focal weakness, seizures, loss of consciousness, weakness and headaches.  Endo/Heme/Allergies: Negative.  Negative for environmental allergies and polydipsia. Does not bruise/bleed easily.  Psychiatric/Behavioral: Negative.  Negative for depression, hallucinations, memory loss, substance abuse and suicidal ideas. The patient is not nervous/anxious and does not have insomnia.     Vital Signs: Blood pressure (!) 104/56, pulse 69, temperature 97.8 F (36.6 C), temperature source Oral, resp. rate 18, height 6\' 2"  (1.88 m), weight 117 kg, SpO2 96 %.  Weight trends: Filed Weights   09/08/19 1922 09/08/19 2230 09/09/19 0410  Weight: 116.1 kg 121.4 kg 117 kg    Physical Exam: General: NAD, laying in bed  Head: Normocephalic, atraumatic. Moist oral mucosal membranes  Eyes: Anicteric, PERRL  Neck: Supple, trachea midline  Lungs:  Diminished on right, bilateral crackles  Heart: Regular rate and rhythm  Abdomen:  Soft, nontender,   Extremities:  ++ peripheral edema.  Neurologic: Nonfocal, moving all four extremities  Skin: No lesions         Lab results: Basic Metabolic Panel: Recent Labs  Lab 09/08/19 1952 09/09/19 0647  NA 134* 135  K 3.7 3.6  CL 100 101  CO2 24 27  GLUCOSE 106* 106*  BUN 27* 26*  CREATININE 2.06* 1.90*  CALCIUM 8.7* 8.4*    Liver  Function Tests: No results for input(s): AST, ALT, ALKPHOS, BILITOT, PROT, ALBUMIN in the last 168 hours. No results for input(s): LIPASE, AMYLASE in the last 168 hours. No results for input(s): AMMONIA in the last 168 hours.  CBC: Recent Labs  Lab 09/08/19 1952 09/09/19 0647  WBC 6.2 4.8  NEUTROABS 4.9  --   HGB 9.7* 9.0*  HCT 28.9* 27.2*  MCV 94.1 94.1  PLT 118* 100*    Cardiac Enzymes: No results for input(s): CKTOTAL, CKMB, CKMBINDEX, TROPONINI in the last 168 hours.  BNP: Invalid input(s): POCBNP  CBG: No results for input(s): GLUCAP in the last 168 hours.  Microbiology: Results for orders placed or performed during the hospital encounter of 09/08/19  SARS Coronavirus 2 [facility] order, Performed in [facility] hospital lab) Nasopharyngeal Nasopharyngeal Swab     Status: None  Collection Time: 09/08/19  7:57 PM   Specimen: Nasopharyngeal Swab  Result Value Ref Range Status   SARS Coronavirus 2 NEGATIVE NEGATIVE Final    Comment: (NOTE) If result is NEGATIVE SARS-CoV-2 target nucleic acids are NOT DETECTED. The SARS-CoV-2 RNA is generally detectable in upper and lower  respiratory specimens during the acute phase of infection. The lowest  concentration of SARS-CoV-2 viral copies this assay can detect is 250  copies / mL. A negative result does not preclude SARS-CoV-2 infection  and should not be used as the sole basis for treatment or other  patient management decisions.  A negative result may occur with  improper specimen collection / handling, submission of specimen other  than nasopharyngeal swab, presence of viral mutation(s) within the  areas targeted by this assay, and inadequate number of viral copies  (<250 copies / mL). A negative result must be combined with clinical  observations, patient history, and epidemiological information. If result is POSITIVE SARS-CoV-2 target nucleic acids are DETECTED. The SARS-CoV-2 RNA is generally detectable in upper and  lower  respiratory specimens dur ing the acute phase of infection.  Positive  results are indicative of active infection with SARS-CoV-2.  Clinical  correlation with patient history and other diagnostic information is  necessary to determine patient infection status.  Positive results do  not rule out bacterial infection or co-infection with other viruses. If result is PRESUMPTIVE POSTIVE SARS-CoV-2 nucleic acids MAY BE PRESENT.   A presumptive positive result was obtained on the submitted specimen  and confirmed on repeat testing.  While 2019 novel coronavirus  (SARS-CoV-2) nucleic acids may be present in the submitted sample  additional confirmatory testing may be necessary for epidemiological  and / or clinical management purposes  to differentiate between  SARS-CoV-2 and other Sarbecovirus currently known to infect humans.  If clinically indicated additional testing with an alternate test  methodology [phone]) is advised. The SARS-CoV-2 RNA is generally  detectable in upper and lower respiratory sp ecimens during the acute  phase of infection. The expected result is Negative. Fact Sheet for Patients:  StrictlyIdeas.no Fact Sheet for Healthcare Providers: BankingDealers.co.za This test is not yet approved or cleared by the [place] FDA and has been authorized for detection and/or diagnosis of SARS-CoV-2 by FDA under an Emergency Use Authorization (EUA).  This EUA will remain in effect (meaning this test can be used) for the duration of the COVID-19 declaration under Section 564(b)(1) of the Act, 21 U.S.C. section 360bbb-3(b)(1), unless the authorization is terminated or revoked sooner. Performed at [facility], [address] [place], [place] [address]     Coagulation Studies: Recent Labs    09/08/19 1952  LABPROT 20.7*  INR 1.8*    Urinalysis: No results for input(s): COLORURINE, LABSPEC, PHURINE,  GLUCOSEU, HGBUR, BILIRUBINUR, KETONESUR, PROTEINUR, UROBILINOGEN, NITRITE, LEUKOCYTESUR in the last 72 hours.  Invalid input(s): APPERANCEUR    Imaging: Dg Chest Portable 1 View  Result Date: 09/08/2019 CLINICAL DATA:  Dyspnea with exertion. EXAM: PORTABLE CHEST 1 VIEW COMPARISON:  08/21/2019 FINDINGS: Stable enlarged cardiac silhouette, post CABG changes and left subclavian pacer and AICD leads. Stable prominence of the pulmonary vasculature and interstitial markings. Interval small to moderate-sized right pleural effusion. No acute bony abnormality. IMPRESSION: 1. Interval small to moderate-sized right pleural effusion. 2. Stable cardiomegaly, pulmonary vascular congestion and mild chronic interstitial lung disease. Electronically Signed   By: Claudie Revering M.D.   On: 09/08/2019 20:19      Assessment &  Plan: Mr. Dasean Brow is a 78 y.o. white male with hypertension, gout, depression, coronary artery disease, COPD, congestive heart failure, atrial fibrillation, AICD, prostate cancer, GERD, obstructive sleep apnea, thrombocytopenia, who is admitted to [facility] on 09/08/2019 for DOE (dyspnea on exertion) [R06.00] Pleural effusion [J90] Acute on chronic systolic congestive heart failure ([place]) [I50.23] Acute on chronic systolic CHF (congestive heart failure) ([place]) [I50.23]  1. Acute renal failure on chronic kidney disease stage III: baseline creatinine of 1.43, GFR of 47 on 08/17/19.  Chronic kidney disease secondary to hypertension Acute renal failure secondary to acute cardiorenal syndrome - Resume Entresto - Check urine studies - Check renal ultrasound  2. Hypertension with acute exacerbation of systolic congestive heart failure. Echocardiogram on 9/16 EF of 30%.  - IV furosemide - continue spironolactone - low salt diet, fluid restriction  3. Right pleural effusion - thoracentesis is being planned.   LOS: 1 Apolonia Ellwood 10/4/20201:40 PM

## 2019-09-10 ENCOUNTER — Inpatient Hospital Stay: Payer: Medicare Other

## 2019-09-10 LAB — CBC
HCT: 28.8 % — ABNORMAL LOW (ref 39.0–52.0)
Hemoglobin: 9.3 g/dL — ABNORMAL LOW (ref 13.0–17.0)
MCH: 30.7 pg (ref 26.0–34.0)
MCHC: 32.3 g/dL (ref 30.0–36.0)
MCV: 95 fL (ref 80.0–100.0)
Platelets: 117 10*3/uL — ABNORMAL LOW (ref 150–400)
RBC: 3.03 MIL/uL — ABNORMAL LOW (ref 4.22–5.81)
RDW: 14.4 % (ref 11.5–15.5)
WBC: 5.6 10*3/uL (ref 4.0–10.5)
nRBC: 0 % (ref 0.0–0.2)

## 2019-09-10 LAB — BODY FLUID CELL COUNT WITH DIFFERENTIAL
Eos, Fluid: 0 %
Lymphs, Fluid: 52 %
Monocyte-Macrophage-Serous Fluid: 38 %
Neutrophil Count, Fluid: 10 %
Other Cells, Fluid: 0 %
Total Nucleated Cell Count, Fluid: 182 cu mm

## 2019-09-10 LAB — HEPATITIS C ANTIBODY: HCV Ab: NONREACTIVE

## 2019-09-10 LAB — BASIC METABOLIC PANEL
Anion gap: 10 (ref 5–15)
BUN: 24 mg/dL — ABNORMAL HIGH (ref 8–23)
CO2: 27 mmol/L (ref 22–32)
Calcium: 8.8 mg/dL — ABNORMAL LOW (ref 8.9–10.3)
Chloride: 100 mmol/L (ref 98–111)
Creatinine, Ser: 1.8 mg/dL — ABNORMAL HIGH (ref 0.61–1.24)
GFR calc Af Amer: 41 mL/min — ABNORMAL LOW (ref >60)
GFR calc non Af Amer: 36 mL/min — ABNORMAL LOW (ref >60)
Glucose, Bld: 111 mg/dL — ABNORMAL HIGH (ref 70–99)
Potassium: 3.9 mmol/L (ref 3.5–5.1)
Sodium: 137 mmol/L (ref 135–145)

## 2019-09-10 LAB — APTT
aPTT: 128 seconds — ABNORMAL HIGH (ref 24–36)
aPTT: 38 seconds — ABNORMAL HIGH (ref 24–36)

## 2019-09-10 LAB — PROTEIN, PLEURAL OR PERITONEAL FLUID: Total protein, fluid: <3 g/dL

## 2019-09-10 LAB — HEPATITIS B CORE ANTIBODY, IGM: Hep B C IgM: NONREACTIVE

## 2019-09-10 LAB — MAGNESIUM: Magnesium: 2.3 mg/dL (ref 1.7–2.4)

## 2019-09-10 LAB — LACTATE DEHYDROGENASE, PLEURAL OR PERITONEAL FLUID: LD, Fluid: 42 U/L — ABNORMAL HIGH (ref 3–23)

## 2019-09-10 LAB — URIC ACID: Uric Acid, Serum: 9.3 mg/dL — ABNORMAL HIGH (ref 3.7–8.6)

## 2019-09-10 LAB — GLUCOSE, PLEURAL OR PERITONEAL FLUID: Glucose, Fluid: 114 mg/dL

## 2019-09-10 LAB — HEPATITIS B SURFACE ANTIGEN: Hepatitis B Surface Ag: NONREACTIVE

## 2019-09-10 LAB — HEPATITIS B SURFACE ANTIBODY,QUALITATIVE: Hep B S Ab: NONREACTIVE

## 2019-09-10 MED ORDER — HEPARIN (PORCINE) 25000 UT/250ML-% IV SOLN
1400.0000 [IU]/h | INTRAVENOUS | Status: DC
  Administered 2019-09-10: 1400 [IU]/h via INTRAVENOUS
  Filled 2019-09-10: qty 250

## 2019-09-10 MED ORDER — FUROSEMIDE 10 MG/ML IJ SOLN
40.0000 mg | Freq: Two times a day (BID) | INTRAMUSCULAR | Status: DC
  Administered 2019-09-10: 40 mg via INTRAVENOUS
  Filled 2019-09-10: qty 4

## 2019-09-10 MED ORDER — METOPROLOL SUCCINATE ER 50 MG PO TB24
50.0000 mg | ORAL_TABLET | Freq: Every day | ORAL | Status: DC
  Administered 2019-09-10 – 2019-09-13 (×3): 50 mg via ORAL
  Filled 2019-09-10 (×4): qty 1

## 2019-09-10 NOTE — Progress Notes (Signed)
[place] [facility] [place] NAME: Rick Gilmore    MR#:  [id number]  DATE OF BIRTH:  1941-05-24  SUBJECTIVE:  CHIEF COMPLAINT:   Chief Complaint  Patient presents with  . Shortness of Breath   The patient has better shortness of breath and leg swelling.  He said he gained 1 pound.  His blood pressure was low at 80s last night. REVIEW OF SYSTEMS:  Review of Systems  Constitutional: Negative for chills, fever and malaise/fatigue.  HENT: Negative for sore throat.   Eyes: Negative for blurred vision and double vision.  Respiratory: Positive for shortness of breath. Negative for cough, hemoptysis, sputum production, wheezing and stridor.   Cardiovascular: Positive for leg swelling. Negative for chest pain, palpitations and orthopnea.  Gastrointestinal: Negative for abdominal pain, blood in stool, diarrhea, melena, nausea and vomiting.  Genitourinary: Negative for dysuria, flank pain and hematuria.  Musculoskeletal: Negative for back pain and joint pain.  Skin: Negative for rash.  Neurological: Negative for dizziness, sensory change, focal weakness, seizures, loss of consciousness, weakness and headaches.  Endo/Heme/Allergies: Negative for polydipsia.  Psychiatric/Behavioral: Negative for depression. The patient is not nervous/anxious.     DRUG ALLERGIES:  No Known Allergies VITALS:  Blood pressure 110/68, pulse 71, temperature 97.7 F (36.5 C), temperature source Oral, resp. rate 19, height 6\' 2"  (1.88 m), weight 117.6 kg, SpO2 96 %. PHYSICAL EXAMINATION:  Physical Exam Constitutional:      General: He is not in acute distress.    Appearance: Normal appearance.  HENT:     Head: Normocephalic.     Mouth/Throat:     Mouth: Mucous membranes are moist.  Eyes:     General: No scleral icterus.    Conjunctiva/sclera: Conjunctivae normal.     Pupils: Pupils are equal, round, and reactive to light.  Neck:     Musculoskeletal: Normal range of  motion and neck supple.     Vascular: No JVD.     Trachea: No tracheal deviation.  Cardiovascular:     Rate and Rhythm: Normal rate and regular rhythm.     Heart sounds: Normal heart sounds. No murmur. No gallop.   Pulmonary:     Effort: Pulmonary effort is normal. No respiratory distress.     Breath sounds: Rales present. No wheezing.  Abdominal:     General: Bowel sounds are normal. There is no distension.     Palpations: Abdomen is soft.     Tenderness: There is no abdominal tenderness. There is no rebound.  Musculoskeletal: Normal range of motion.        General: No tenderness.     Right lower leg: Edema present.     Left lower leg: Edema present.  Skin:    Findings: No erythema or rash.  Neurological:     General: No focal deficit present.     Mental Status: He is alert and oriented to person, place, and time.     Cranial Nerves: No cranial nerve deficit.  Psychiatric:        Mood and Affect: Mood normal.    LABORATORY PANEL:  Male CBC Recent Labs  Lab 09/10/19 0522  WBC 5.6  HGB 9.3*  HCT 28.8*  PLT 117*   ------------------------------------------------------------------------------------------------------------------ Chemistries  Recent Labs  Lab 09/10/19 0522  NA 137  K 3.9  CL 100  CO2 27  GLUCOSE 111*  BUN 24*  CREATININE 1.80*  CALCIUM 8.8*  MG 2.3   RADIOLOGY:  US  Renal  Result Date: 09/09/2019 CLINICAL DATA:  Acute renal failure. Chronic kidney disease stage 3. EXAM: RENAL / URINARY TRACT ULTRASOUND COMPLETE COMPARISON:  06/13/2014 FINDINGS: Right Kidney: Renal measurements: 11.5 x 5.4 x 5.3 cm = volume: 171 mL . Echogenicity within normal limits. No mass or hydronephrosis visualized. Left Kidney: Renal measurements: 4.0 x 4.8 x 7.2 cm = volume: 216 mL. Echogenicity within normal limits. No mass or hydronephrosis visualized. Bladder: Empty. IMPRESSION: Normal size kidneys without hydronephrosis. Electronically Signed   By: Marin Olp M.D.   On:  09/09/2019 16:33   Dg Chest Port 1 View  Result Date: 09/10/2019 CLINICAL DATA:  Follow-up pleural effusion EXAM: PORTABLE CHEST 1 VIEW COMPARISON:  09/08/2019 FINDINGS: Cardiac shadow is again enlarged. Defibrillator is again noted and stable. Aortic calcifications are seen. Elevation of the right hemidiaphragm is noted consistent with volume loss. Right pleural effusion and basilar atelectasis is noted. Mild vascular congestion is again seen. IMPRESSION: Increasing opacity in the right lung base consistent with a combination of effusion and atelectasis. Vascular congestion is noted which is stable. Electronically Signed   By: Inez Catalina M.D.   On: 09/10/2019 07:53   ASSESSMENT AND PLAN:    Acute on chronic systolic CHF (congestive heart failure) ejection fraction 30%. He was treated with IV Lasix and changed to torsemide.  Hold torsemide and decrease IV Lasix 40 mg twice daily per Dr. Clayborn Bigness.   continue spironolactone, Toprol and Entresto.  Hold if her blood pressure is low.  Pleural effusion. Interval small to moderate-sized right pleural effusion. Repeated chest x-ray does not show improvement. Thoracentesis today per Dr. Clayborn Bigness.  Eliquis is on hold, on heparin drip.    COPD (chronic obstructive pulmonary disease) ([place]) -continue home meds   Atrial fibrillation ([place]) -continue amiodarone and Toprol, hold Eliquis for possible thoracentesis.  On heparin drip.   Sleep apnea, obstructive -BiPAP nightly   CKD stage III-at baseline, avoid nephrotoxins and follow-up BMP.   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 Anemia of chronic disease.  Stable.  I discussed with Dr. Clayborn Bigness. All the records are reviewed and case discussed with Care Management/Social Worker. Management plans discussed with the patient, his daughter and they are in agreement.  CODE STATUS: Full Code  TOTAL TIME TAKING CARE OF THIS PATIENT: 35 minutes.   More than 50% of the time was spent in  counseling/coordination of care: YES  POSSIBLE D/C IN 2 DAYS, DEPENDING ON CLINICAL CONDITION.   Demetrios Loll M.D on 09/10/2019 at 10:46 AM  Between 7am to 6pm - Pager - [phone]  After 6pm go to [email or web address] - [facility]

## 2019-09-10 NOTE — Progress Notes (Signed)
[place] for heparin drip management  Indication: atrial fibrillation  No Known Allergies  Patient Measurements: Height: 6\' 2"  (188 cm) Weight: 259 lb 3.2 oz (117.6 kg) IBW/kg (Calculated) : 82.2 Heparin Dosing Weight: 108kg   Vital Signs: Temp: 97.9 F (36.6 C) (10/05 1830) Temp Source: Oral (10/05 1830) BP: 98/56 (10/05 1830) Pulse Rate: 70 (10/05 1830)  Labs: Recent Labs    09/08/19 1952 09/08/19 2133 09/09/19 0647 09/09/19 1541 09/10/19 0522 09/10/19 0657 09/10/19 1731  HGB 9.7*  --  9.0*  --  9.3*  --   --   HCT 28.9*  --  27.2*  --  28.8*  --   --   PLT 118*  --  100*  --  117*  --   --   APTT 48*  --   --   --   --  128* 38*  LABPROT 20.7*  --   --  21.3*  --   --   --   INR 1.8*  --   --  1.9*  --   --   --   CREATININE 2.06*  --  1.90*  --  1.80*  --   --   TROPONINIHS 37* 35*  --   --   --   --   --     Estimated Creatinine Clearance: 46.9 mL/min (A) (by C-G formula based on SCr of 1.8 mg/dL (H)).   Medications:  Scheduled:  . allopurinol  100 mg Oral Daily  . amiodarone  200 mg Oral Daily  . colchicine  0.6 mg Oral Daily  . escitalopram  10 mg Oral Daily  . fluticasone  2 spray Each Nare Daily  . isosorbide mononitrate  30 mg Oral Daily  . metoprolol succinate  50 mg Oral Daily  . montelukast  10 mg Oral QHS  . pantoprazole  40 mg Oral Daily  . potassium chloride SA  20 mEq Oral Daily  . pravastatin  80 mg Oral q1800  . sodium chloride flush  3 mL Intravenous Q12H  . spironolactone  25 mg Oral Daily  . umeclidinium-vilanterol  1 puff Inhalation Daily  . vitamin B-12  1,000 mcg Oral q morning - 10a   Infusions:  . heparin 1,400 Units/hr (09/10/19 1705)    Assessment: Pharmacy consulted for heparin drip management for 78 yo male on apixaban as an outpatient for atrial fibrillation. Patient with past medical history significant for AICD, anemia, arrhythmia, atrial fibrillation, CAD, prostate cancer, CHF,  COPD, GERD, hyperlipidemia, hypertension, MI, and thrombocytopenia. Patient received apixaban 5mg  at 1036 on 10/4. Patient being transitioned to heparin for possible upcoming procedure.  Hgb low but stable, 9.0 --> 9.3.  Platelets low prior to start of heparin, 118 --> 100 --> 117.  Goal of Therapy:  Heparin level 0.3-0.7 units/ml when aPTT and HL correlate.  aPTT 66-102 seconds Monitor platelets by anticoagulation protocol: Yes  1005 0700 APTT 128 decreased rate to 1400 units/hr.  1005 1731 aPTT 38   Plan:  APTT is subtherapeutic. Spoke with [employer]. Heparin was stopped for 1-2 hours for thoracentesis. Plan to continue heparin at 1400 units/hr. Will obtain another aPTT level in 6 hours. F/u with HL and CBC with AM labs.   Due to recent apixaban administration patient will need to be managed with aPTT and anti-Xa levels until they correlate. Expect this will take 3-4 days.   Pharmacy will continue to monitor platelets and CBC per protocol.  [employer]  Serita Grit, PharmD, BCPS 09/10/2019,6:33 PM

## 2019-09-10 NOTE — Progress Notes (Addendum)
[place] for heparin drip management  Indication: atrial fibrillation  No Known Allergies  Patient Measurements: Height: 6\' 2"  (188 cm) Weight: 259 lb 3.2 oz (117.6 kg) IBW/kg (Calculated) : 82.2 Heparin Dosing Weight: 108kg   Vital Signs: Temp: 97.7 F (36.5 C) (10/05 0821) Temp Source: Oral (10/05 0821) BP: 110/68 (10/05 0821) Pulse Rate: 71 (10/05 0821)  Labs: Recent Labs    09/08/19 1952 09/08/19 2133 09/09/19 0647 09/09/19 1541 09/10/19 0522 09/10/19 0657  HGB 9.7*  --  9.0*  --  9.3*  --   HCT 28.9*  --  27.2*  --  28.8*  --   PLT 118*  --  100*  --  117*  --   APTT 48*  --   --   --   --  128*  LABPROT 20.7*  --   --  21.3*  --   --   INR 1.8*  --   --  1.9*  --   --   CREATININE 2.06*  --  1.90*  --  1.80*  --   TROPONINIHS 37* 35*  --   --   --   --     Estimated Creatinine Clearance: 46.9 mL/min (A) (by C-G formula based on SCr of 1.8 mg/dL (H)).   Medications:  Scheduled:  . allopurinol  100 mg Oral Daily  . amiodarone  200 mg Oral Daily  . colchicine  0.6 mg Oral Daily  . escitalopram  10 mg Oral Daily  . fluticasone  2 spray Each Nare Daily  . furosemide  40 mg Intravenous BID  . isosorbide mononitrate  30 mg Oral Daily  . metoprolol succinate  50 mg Oral Daily  . montelukast  10 mg Oral QHS  . pantoprazole  40 mg Oral Daily  . potassium chloride SA  20 mEq Oral Daily  . pravastatin  80 mg Oral q1800  . sacubitril-valsartan  1 tablet Oral BID  . sodium chloride flush  3 mL Intravenous Q12H  . spironolactone  25 mg Oral Daily  . umeclidinium-vilanterol  1 puff Inhalation Daily  . vitamin B-12  1,000 mcg Oral q morning - 10a   Infusions:  . heparin 1,550 Units/hr (09/10/19 0630)    Assessment: Pharmacy consulted for heparin drip management for 78 yo male on apixaban as an outpatient for atrial fibrillation. Patient with past medical history significant for AICD, anemia, arrhythmia, atrial fibrillation,  CAD, prostate cancer, CHF, COPD, GERD, hyperlipidemia, hypertension, MI, and thrombocytopenia. Patient received apixaban 5mg  at 1036 on 10/4. Patient being transitioned to heparin for possible upcoming procedure.  Hgb low but stable, 9.0 --> 9.3.  Platelets low prior to start of heparin, 118 --> 100 --> 117.  Goal of Therapy:  Heparin level 0.3-0.7 units/ml aPTT 66-102 seconds Monitor platelets by anticoagulation protocol: Yes  1005 0700 APTT 128   Plan:  S/w nurse, who reported no signs of bleeding.  Decrease rate from 1550 units/hour to 1400 units/hour.  New APTT in 8 hours, and heparin level tomorrow morning.  Due to recent apixaban administration patient will need to be managed with aPTT and anti-Xa levels until they correlate. Expect this will take 3-4 days.   Pharmacy will continue to monitor platelets and CBC per protocol.  Gerald Dexter 09/10/2019,8:27 AM

## 2019-09-10 NOTE — Procedures (Signed)
US thoracentesis without difficulty   Complications:  None  Blood Loss: none  See dictation in canopy pacs  

## 2019-09-10 NOTE — Telephone Encounter (Signed)
Ok to give verbal order to dc pt

## 2019-09-10 NOTE — Progress Notes (Signed)
[facility], [place] 09/10/19  Subjective:   10/04 0701 - 10/05 0700 In: 114.5 [I.V.:114.5] Out: 1550 [Urine:1550]\  Urine output 1550 cc last 24 hours Blood pressure is noted to be low normal Cardiovascular regimen includes amiodarone, IV Lasix 40 mg twice a day, amiodarone, metoprolol 50 mg daily, Entresto 24-26 mg daily, spironolactone 25 mg daily  urinalysis on October 4 is negative for protein or blood. Today he feels that his urine output has decreased He is able to eat okay. His fluid restricted to 1500 cc per Significant dyspnea on exertion  Objective:  Vital signs in last 24 hours:  Temp:  [97.7 F (36.5 C)-98 F (36.7 C)] 97.7 F (36.5 C) (10/05 0821) Pulse Rate:  [67-97] 71 (10/05 0821) Resp:  [18-19] 19 (10/05 0821) BP: (83-117)/(46-105) 110/68 (10/05 0821) SpO2:  [83 %-96 %] 96 % (10/05 0821) Weight:  [117.6 kg] 117.6 kg (10/05 0413)  Weight change: 1.452 kg Filed Weights   09/08/19 2230 09/09/19 0410 09/10/19 0413  Weight: 121.4 kg 117 kg 117.6 kg    Intake/Output:    Intake/Output Summary (Last 24 hours) at 09/10/2019 1315 Last data filed at 09/10/2019 0958 Gross per 24 hour  Intake 354.49 ml  Output 1525 ml  Net -1170.51 ml     Physical Exam: General:  No acute distress, laying in the bed  HEENT  anicteric, moist oral mucous membranes  Pulm/lungs  decreased breath sounds at bases  CVS/Heart  regular rhythm  Abdomen:   Soft, nontender  Extremities:  Trace to 1+ edema  Neurologic:  Alert, oriented    Basic Metabolic Panel:  Recent Labs  Lab 09/08/19 1952 09/09/19 0647 09/10/19 0522  NA 134* 135 137  K 3.7 3.6 3.9  CL 100 101 100  CO2 24 27 27   GLUCOSE 106* 106* 111*  BUN 27* 26* 24*  CREATININE 2.06* 1.90* 1.80*  CALCIUM 8.7* 8.4* 8.8*  MG  --   --  2.3     CBC: Recent Labs  Lab 09/08/19 1952 09/09/19 0647 09/10/19 0522  WBC 6.2 4.8 5.6  NEUTROABS 4.9  --   --   HGB 9.7* 9.0* 9.3*  HCT 28.9*  27.2* 28.8*  MCV 94.1 94.1 95.0  PLT 118* 100* 117*      Lab Results  Component Value Date   HEPBSAG NON REACTIVE 09/10/2019   HEPBSAB NON REACTIVE 09/10/2019      Microbiology:  Recent Results (from the past 240 hour(s))  SARS Coronavirus 2 [facility] order, Performed in [facility] lab) Nasopharyngeal Nasopharyngeal Swab     Status: None   Collection Time: 09/08/19  7:57 PM   Specimen: Nasopharyngeal Swab  Result Value Ref Range Status   SARS Coronavirus 2 NEGATIVE NEGATIVE Final    Comment: (NOTE) If result is NEGATIVE SARS-CoV-2 target nucleic acids are NOT DETECTED. The SARS-CoV-2 RNA is generally detectable in upper and lower  respiratory specimens during the acute phase of infection. The lowest  concentration of SARS-CoV-2 viral copies this assay can detect is 250  copies / mL. A negative result does not preclude SARS-CoV-2 infection  and should not be used as the sole basis for treatment or other  patient management decisions.  A negative result may occur with  improper specimen collection / handling, submission of specimen other  than nasopharyngeal swab, presence of viral mutation(s) within the  areas targeted by this assay, and inadequate number of viral copies  (<250 copies / mL). A negative result  must be combined with clinical  observations, patient history, and epidemiological information. If result is POSITIVE SARS-CoV-2 target nucleic acids are DETECTED. The SARS-CoV-2 RNA is generally detectable in upper and lower  respiratory specimens dur ing the acute phase of infection.  Positive  results are indicative of active infection with SARS-CoV-2.  Clinical  correlation with patient history and other diagnostic information is  necessary to determine patient infection status.  Positive results do  not rule out bacterial infection or co-infection with other viruses. If result is PRESUMPTIVE POSTIVE SARS-CoV-2 nucleic acids MAY BE PRESENT.   A  presumptive positive result was obtained on the submitted specimen  and confirmed on repeat testing.  While 2019 novel coronavirus  (SARS-CoV-2) nucleic acids may be present in the submitted sample  additional confirmatory testing may be necessary for epidemiological  and / or clinical management purposes  to differentiate between  SARS-CoV-2 and other Sarbecovirus currently known to infect humans.  If clinically indicated additional testing with an alternate test  methodology [phone]) is advised. The SARS-CoV-2 RNA is generally  detectable in upper and lower respiratory sp ecimens during the acute  phase of infection. The expected result is Negative. Fact Sheet for Patients:  StrictlyIdeas.no Fact Sheet for Healthcare Providers: BankingDealers.co.za This test is not yet approved or cleared by the [place] FDA and has been authorized for detection and/or diagnosis of SARS-CoV-2 by FDA under an Emergency Use Authorization (EUA).  This EUA will remain in effect (meaning this test can be used) for the duration of the COVID-19 declaration under Section 564(b)(1) of the Act, 21 U.S.C. section 360bbb-3(b)(1), unless the authorization is terminated or revoked sooner. Performed at [facility] [address] [place], [place] [address]     Coagulation Studies: Recent Labs    09/08/19 1952 09/09/19 1541  LABPROT 20.7* 21.3*  INR 1.8* 1.9*    Urinalysis: Recent Labs    09/09/19 1811  COLORURINE STRAW*  LABSPEC 1.006  PHURINE 7.0  GLUCOSEU NEGATIVE  HGBUR NEGATIVE  BILIRUBINUR NEGATIVE  KETONESUR NEGATIVE  PROTEINUR NEGATIVE  NITRITE NEGATIVE  LEUKOCYTESUR NEGATIVE      Imaging: US Renal  Result Date: 09/09/2019 CLINICAL DATA:  Acute renal failure. Chronic kidney disease stage 3. EXAM: RENAL / URINARY TRACT ULTRASOUND COMPLETE COMPARISON:  06/13/2014 FINDINGS: Right Kidney: Renal measurements: 11.5 x 5.4 x 5.3  cm = volume: 171 mL . Echogenicity within normal limits. No mass or hydronephrosis visualized. Left Kidney: Renal measurements: 4.0 x 4.8 x 7.2 cm = volume: 216 mL. Echogenicity within normal limits. No mass or hydronephrosis visualized. Bladder: Empty. IMPRESSION: Normal size kidneys without hydronephrosis. Electronically Signed   By: Marin Olp M.D.   On: 09/09/2019 16:33   Dg Chest Port 1 View  Result Date: 09/10/2019 CLINICAL DATA:  Follow-up pleural effusion EXAM: PORTABLE CHEST 1 VIEW COMPARISON:  09/08/2019 FINDINGS: Cardiac shadow is again enlarged. Defibrillator is again noted and stable. Aortic calcifications are seen. Elevation of the right hemidiaphragm is noted consistent with volume loss. Right pleural effusion and basilar atelectasis is noted. Mild vascular congestion is again seen. IMPRESSION: Increasing opacity in the right lung base consistent with a combination of effusion and atelectasis. Vascular congestion is noted which is stable. Electronically Signed   By: Inez Catalina M.D.   On: 09/10/2019 07:53   Dg Chest Portable 1 View  Result Date: 09/08/2019 CLINICAL DATA:  Dyspnea with exertion. EXAM: PORTABLE CHEST 1 VIEW COMPARISON:  08/21/2019 FINDINGS: Stable enlarged cardiac silhouette, post CABG  changes and left subclavian pacer and AICD leads. Stable prominence of the pulmonary vasculature and interstitial markings. Interval small to moderate-sized right pleural effusion. No acute bony abnormality. IMPRESSION: 1. Interval small to moderate-sized right pleural effusion. 2. Stable cardiomegaly, pulmonary vascular congestion and mild chronic interstitial lung disease. Electronically Signed   By: Claudie Revering M.D.   On: 09/08/2019 20:19     Medications:   . heparin 1,400 Units/hr (09/10/19 0901)   . allopurinol  100 mg Oral Daily  . amiodarone  200 mg Oral Daily  . colchicine  0.6 mg Oral Daily  . escitalopram  10 mg Oral Daily  . fluticasone  2 spray Each Nare Daily  .  furosemide  40 mg Intravenous BID  . isosorbide mononitrate  30 mg Oral Daily  . metoprolol succinate  50 mg Oral Daily  . montelukast  10 mg Oral QHS  . pantoprazole  40 mg Oral Daily  . potassium chloride SA  20 mEq Oral Daily  . pravastatin  80 mg Oral q1800  . sacubitril-valsartan  1 tablet Oral BID  . sodium chloride flush  3 mL Intravenous Q12H  . spironolactone  25 mg Oral Daily  . umeclidinium-vilanterol  1 puff Inhalation Daily  . vitamin B-12  1,000 mcg Oral q morning - 10a   acetaminophen **OR** acetaminophen, ondansetron **OR** ondansetron (ZOFRAN) IV  Assessment/ Plan:  78 y.o.caucsian male with hypertension, gout, depression, coronary artery disease, COPD, congestive heart failure, atrial fibrillation, AICD, prostate cancer, GERD, obstructive sleep apnea, thrombocytopenia, who is admitted to [facility] on 09/08/2019 for DOE (dyspnea on exertion) [R06.00] Pleural effusion [J90] Acute on chronic systolic congestive heart failure ([place]) [I50.23] Acute on chronic systolic CHF (congestive heart failure) ([place]) [I50.23]  1. Acute renal failure on chronic kidney disease stage III: baseline creatinine of 1.43, GFR of 47 on 08/17/19.  Chronic kidney disease secondary to hypertension Acute renal failure secondary to acute cardiorenal syndrome -With oliguria and low blood pressure, hold Entresto.  Discussed with Dr. Clayborn Bigness -If blood pressure still remains low, will need to adjust the dose of metoprolol tomorrow -Hopefully with thoracentesis, oxygenation and blood pressure will improve  2. acute exacerbation of chronic systolic congestive heart failure. Echocardiogram on 9/16 EF of 30%.  - Hold IV furosemide - continue spironolactone - low salt diet, fluid restriction 1500 cc/d  3. Right pleural effusion - thoracentesis is planned for today   LOS: 2 Rick Gilmore 10/5/20201:15 PM  [place], [place]  Note: This note was prepared  with Dragon dictation. Any transcription errors are unintentional

## 2019-09-10 NOTE — Plan of Care (Signed)
SBP 80s this shift.  Denies symptoms of hypotension.  MD aware.  Entresto held this shift.

## 2019-09-11 LAB — PROTEIN ELECTROPHORESIS, SERUM
A/G Ratio: 1.5 (ref 0.7–1.7)
Albumin ELP: 3.5 g/dL (ref 2.9–4.4)
Alpha-1-Globulin: 0.2 g/dL (ref 0.0–0.4)
Alpha-2-Globulin: 0.8 g/dL (ref 0.4–1.0)
Beta Globulin: 0.7 g/dL (ref 0.7–1.3)
Gamma Globulin: 0.6 g/dL (ref 0.4–1.8)
Globulin, Total: 2.3 g/dL (ref 2.2–3.9)
Total Protein ELP: 5.8 g/dL — ABNORMAL LOW (ref 6.0–8.5)

## 2019-09-11 LAB — KAPPA/LAMBDA LIGHT CHAINS
Kappa free light chain: 49.9 mg/L — ABNORMAL HIGH (ref 3.3–19.4)
Kappa, lambda light chain ratio: 2 — ABNORMAL HIGH (ref 0.26–1.65)
Lambda free light chains: 24.9 mg/L (ref 5.7–26.3)

## 2019-09-11 LAB — BASIC METABOLIC PANEL
Anion gap: 8 (ref 5–15)
BUN: 23 mg/dL (ref 8–23)
CO2: 27 mmol/L (ref 22–32)
Calcium: 8.8 mg/dL — ABNORMAL LOW (ref 8.9–10.3)
Chloride: 98 mmol/L (ref 98–111)
Creatinine, Ser: 1.68 mg/dL — ABNORMAL HIGH (ref 0.61–1.24)
GFR calc Af Amer: 45 mL/min — ABNORMAL LOW (ref >60)
GFR calc non Af Amer: 39 mL/min — ABNORMAL LOW (ref >60)
Glucose, Bld: 110 mg/dL — ABNORMAL HIGH (ref 70–99)
Potassium: 3.9 mmol/L (ref 3.5–5.1)
Sodium: 133 mmol/L — ABNORMAL LOW (ref 135–145)

## 2019-09-11 LAB — CBC
HCT: 29.9 % — ABNORMAL LOW (ref 39.0–52.0)
Hemoglobin: 9.8 g/dL — ABNORMAL LOW (ref 13.0–17.0)
MCH: 30.9 pg (ref 26.0–34.0)
MCHC: 32.8 g/dL (ref 30.0–36.0)
MCV: 94.3 fL (ref 80.0–100.0)
Platelets: 117 10*3/uL — ABNORMAL LOW (ref 150–400)
RBC: 3.17 MIL/uL — ABNORMAL LOW (ref 4.22–5.81)
RDW: 14.4 % (ref 11.5–15.5)
WBC: 6 10*3/uL (ref 4.0–10.5)
nRBC: 0 % (ref 0.0–0.2)

## 2019-09-11 LAB — PROTEIN ELECTRO, RANDOM URINE
Albumin ELP, Urine: 100 %
Alpha-1-Globulin, U: 0 %
Alpha-2-Globulin, U: 0 %
Beta Globulin, U: 0 %
Gamma Globulin, U: 0 %
Total Protein, Urine: 8.7 mg/dL

## 2019-09-11 LAB — APTT
aPTT: 110 seconds — ABNORMAL HIGH (ref 24–36)
aPTT: 93 seconds — ABNORMAL HIGH (ref 24–36)

## 2019-09-11 LAB — HEPARIN LEVEL (UNFRACTIONATED): Heparin Unfractionated: 2.04 IU/mL — ABNORMAL HIGH (ref 0.30–0.70)

## 2019-09-11 LAB — PARATHYROID HORMONE, INTACT (NO CA): PTH: 79 pg/mL — ABNORMAL HIGH (ref 15–65)

## 2019-09-11 LAB — PROTEIN, BODY FLUID (OTHER): Total Protein, Body Fluid Other: 1.4 g/dL

## 2019-09-11 MED ORDER — APIXABAN 5 MG PO TABS
5.0000 mg | ORAL_TABLET | Freq: Two times a day (BID) | ORAL | Status: DC
  Administered 2019-09-11 – 2019-09-13 (×5): 5 mg via ORAL
  Filled 2019-09-11 (×5): qty 1

## 2019-09-11 MED ORDER — APIXABAN 5 MG PO TABS: 5.0000 mg | ORAL_TABLET | Freq: Two times a day (BID) | ORAL | Status: DC

## 2019-09-11 MED ORDER — HEPARIN (PORCINE) 25000 UT/250ML-% IV SOLN: 1200.0000 [IU]/h | INTRAVENOUS | Status: DC

## 2019-09-11 NOTE — Care Management Important Message (Signed)
Important Message  Patient Details  Name: Rick Gilmore MRN: [id number] Date of Birth: Apr 29, 1941   Medicare Important Message Given:  Yes     Dannette Barbara 09/11/2019, 10:49 AM

## 2019-09-11 NOTE — Progress Notes (Signed)
[facility], [place] 09/11/19  Subjective:   10/05 0701 - 10/06 0700 In: 480 [P.O.:480] Out: 600 [Urine:600]\  Patient underwent right thoracentesis. 2.3 L of fluid was removed Patient states he feels much much better Blood pressure is still low normal at 107/60 No significant shortness of breath or leg edema Lasix has been on hold Serum creatinine improved today to 1.7  Objective:  Vital signs in last 24 hours:  Temp:  [97.7 F (36.5 C)-98.1 F (36.7 C)] 98.1 F (36.7 C) (10/06 0745) Pulse Rate:  [68-73] 71 (10/06 0745) Resp:  [17-20] 19 (10/06 0745) BP: (87-108)/(52-70) 99/54 (10/06 0745) SpO2:  [94 %-99 %] 97 % (10/06 0745) Weight:  [115.1 kg] 115.1 kg (10/06 0430)  Weight change: -2.449 kg Filed Weights   09/09/19 0410 09/10/19 0413 09/11/19 0430  Weight: 117 kg 117.6 kg 115.1 kg    Intake/Output:    Intake/Output Summary (Last 24 hours) at 09/11/2019 0844 Last data filed at 09/11/2019 0057 Gross per 24 hour  Intake 480 ml  Output 600 ml  Net -120 ml     Physical Exam: General:  No acute distress, laying in the bed  HEENT  anicteric, moist oral mucous membranes  Pulm/lungs  normal breathing effort on room air, clear to auscultation  CVS/Heart  regular rhythm  Abdomen:   Soft, nontender  Extremities:  Trace edema  Neurologic:  Alert, oriented    Basic Metabolic Panel:  Recent Labs  Lab 09/08/19 1952 09/09/19 0647 09/10/19 0522 09/11/19 0448  NA 134* 135 137 133*  K 3.7 3.6 3.9 3.9  CL 100 101 100 98  CO2 24 27 27 27   GLUCOSE 106* 106* 111* 110*  BUN 27* 26* 24* 23  CREATININE 2.06* 1.90* 1.80* 1.68*  CALCIUM 8.7* 8.4* 8.8* 8.8*  MG  --   --  2.3  --      CBC: Recent Labs  Lab 09/08/19 1952 09/09/19 0647 09/10/19 0522 09/11/19 0448  WBC 6.2 4.8 5.6 6.0  NEUTROABS 4.9  --   --   --   HGB 9.7* 9.0* 9.3* 9.8*  HCT 28.9* 27.2* 28.8* 29.9*  MCV 94.1 94.1 95.0 94.3  PLT 118* 100* 117* 117*      Lab  Results  Component Value Date   HEPBSAG NON REACTIVE 09/10/2019   HEPBSAB NON REACTIVE 09/10/2019   HEPBIGM NON REACTIVE 09/10/2019      Microbiology:  Recent Results (from the past 240 hour(s))  SARS Coronavirus 2 [facility] order, Performed in [facility] hospital lab) Nasopharyngeal Nasopharyngeal Swab     Status: None   Collection Time: 09/08/19  7:57 PM   Specimen: Nasopharyngeal Swab  Result Value Ref Range Status   SARS Coronavirus 2 NEGATIVE NEGATIVE Final    Comment: (NOTE) If result is NEGATIVE SARS-CoV-2 target nucleic acids are NOT DETECTED. The SARS-CoV-2 RNA is generally detectable in upper and lower  respiratory specimens during the acute phase of infection. The lowest  concentration of SARS-CoV-2 viral copies this assay can detect is 250  copies / mL. A negative result does not preclude SARS-CoV-2 infection  and should not be used as the sole basis for treatment or other  patient management decisions.  A negative result may occur with  improper specimen collection / handling, submission of specimen other  than nasopharyngeal swab, presence of viral mutation(s) within the  areas targeted by this assay, and inadequate number of viral copies  (<250 copies / mL). A negative result must  be combined with clinical  observations, patient history, and epidemiological information. If result is POSITIVE SARS-CoV-2 target nucleic acids are DETECTED. The SARS-CoV-2 RNA is generally detectable in upper and lower  respiratory specimens dur ing the acute phase of infection.  Positive  results are indicative of active infection with SARS-CoV-2.  Clinical  correlation with patient history and other diagnostic information is  necessary to determine patient infection status.  Positive results do  not rule out bacterial infection or co-infection with other viruses. If result is PRESUMPTIVE POSTIVE SARS-CoV-2 nucleic acids MAY BE PRESENT.   A presumptive positive result was  obtained on the submitted specimen  and confirmed on repeat testing.  While 2019 novel coronavirus  (SARS-CoV-2) nucleic acids may be present in the submitted sample  additional confirmatory testing may be necessary for epidemiological  and / or clinical management purposes  to differentiate between  SARS-CoV-2 and other Sarbecovirus currently known to infect humans.  If clinically indicated additional testing with an alternate test  methodology [phone]) is advised. The SARS-CoV-2 RNA is generally  detectable in upper and lower respiratory sp ecimens during the acute  phase of infection. The expected result is Negative. Fact Sheet for Patients:  StrictlyIdeas.no Fact Sheet for Healthcare Providers: BankingDealers.co.za This test is not yet approved or cleared by the [place] FDA and has been authorized for detection and/or diagnosis of SARS-CoV-2 by FDA under an Emergency Use Authorization (EUA).  This EUA will remain in effect (meaning this test can be used) for the duration of the COVID-19 declaration under Section 564(b)(1) of the Act, 21 U.S.C. section 360bbb-3(b)(1), unless the authorization is terminated or revoked sooner. Performed at [facility], [address] [place], [place] [address]     Coagulation Studies: Recent Labs    09/08/19 1952 09/09/19 1541  LABPROT 20.7* 21.3*  INR 1.8* 1.9*    Urinalysis: Recent Labs    09/09/19 1811  COLORURINE STRAW*  LABSPEC 1.006  PHURINE 7.0  GLUCOSEU NEGATIVE  HGBUR NEGATIVE  BILIRUBINUR NEGATIVE  KETONESUR NEGATIVE  PROTEINUR NEGATIVE  NITRITE NEGATIVE  LEUKOCYTESUR NEGATIVE      Imaging: Dg Chest 1 View  Result Date: 09/10/2019 CLINICAL DATA:  Status post right thoracentesis EXAM: CHEST  1 VIEW COMPARISON:  09/10/2019 FINDINGS: Interval right thoracentesis has been performed with resolution of the previously seen right-sided pleural effusion. No  pneumothorax is noted. The left lung is clear. Defibrillator is again noted and stable. Postsurgical changes are seen and stable. Vascular congestion has improved from the earlier exam. IMPRESSION: No pneumothorax following right-sided thoracentesis. No residual fluid is seen. Previously seen vascular congestion has improved in the interval. Electronically Signed   By: Inez Catalina M.D.   On: 09/10/2019 15:24   US Renal  Result Date: 09/09/2019 CLINICAL DATA:  Acute renal failure. Chronic kidney disease stage 3. EXAM: RENAL / URINARY TRACT ULTRASOUND COMPLETE COMPARISON:  06/13/2014 FINDINGS: Right Kidney: Renal measurements: 11.5 x 5.4 x 5.3 cm = volume: 171 mL . Echogenicity within normal limits. No mass or hydronephrosis visualized. Left Kidney: Renal measurements: 4.0 x 4.8 x 7.2 cm = volume: 216 mL. Echogenicity within normal limits. No mass or hydronephrosis visualized. Bladder: Empty. IMPRESSION: Normal size kidneys without hydronephrosis. Electronically Signed   By: Marin Olp M.D.   On: 09/09/2019 16:33   Dg Chest Port 1 View  Result Date: 09/10/2019 CLINICAL DATA:  Follow-up pleural effusion EXAM: PORTABLE CHEST 1 VIEW COMPARISON:  09/08/2019 FINDINGS: Cardiac shadow is again enlarged. Defibrillator  is again noted and stable. Aortic calcifications are seen. Elevation of the right hemidiaphragm is noted consistent with volume loss. Right pleural effusion and basilar atelectasis is noted. Mild vascular congestion is again seen. IMPRESSION: Increasing opacity in the right lung base consistent with a combination of effusion and atelectasis. Vascular congestion is noted which is stable. Electronically Signed   By: Inez Catalina M.D.   On: 09/10/2019 07:53   US Thoracentesis Asp Pleural Space W/img Guide  Result Date: 09/10/2019 INDICATION: Right-sided pleural effusion EXAM: ULTRASOUND GUIDED RIGHT THORACENTESIS MEDICATIONS: None. COMPLICATIONS: None immediate. PROCEDURE: An ultrasound guided  thoracentesis was thoroughly discussed with the patient and questions answered. The benefits, risks, alternatives and complications were also discussed. The patient understands and wishes to proceed with the procedure. Written consent was obtained. Ultrasound was performed to localize and mark an adequate pocket of fluid in the right chest. The area was then prepped and draped in the normal sterile fashion. 1% Lidocaine was used for local anesthesia. Under ultrasound guidance a 6 Fr Safe-T-Centesis catheter was introduced. Thoracentesis was performed. The catheter was removed and a dressing applied. FINDINGS: A total of approximately 2.3 L of clear yellow fluid was removed. Samples were sent to the laboratory as requested by the clinical team. IMPRESSION: Successful ultrasound guided right thoracentesis yielding 2.3 L of pleural fluid. Electronically Signed   By: Inez Catalina M.D.   On: 09/10/2019 15:23     Medications:   . heparin 1,200 Units/hr (09/11/19 0804)   . allopurinol  100 mg Oral Daily  . amiodarone  200 mg Oral Daily  . colchicine  0.6 mg Oral Daily  . escitalopram  10 mg Oral Daily  . fluticasone  2 spray Each Nare Daily  . isosorbide mononitrate  30 mg Oral Daily  . metoprolol succinate  50 mg Oral Daily  . montelukast  10 mg Oral QHS  . pantoprazole  40 mg Oral Daily  . potassium chloride SA  20 mEq Oral Daily  . pravastatin  80 mg Oral q1800  . sodium chloride flush  3 mL Intravenous Q12H  . spironolactone  25 mg Oral Daily  . umeclidinium-vilanterol  1 puff Inhalation Daily  . vitamin B-12  1,000 mcg Oral q morning - 10a   acetaminophen **OR** acetaminophen, ondansetron **OR** ondansetron (ZOFRAN) IV  Assessment/ Plan:  78 y.o.caucsian male with hypertension, gout, depression, coronary artery disease, COPD, congestive heart failure, atrial fibrillation, AICD, prostate cancer, GERD, obstructive sleep apnea, thrombocytopenia, who is admitted to [facility] on 09/08/2019 for DOE  (dyspnea on exertion) [R06.00] Pleural effusion [J90] Acute on chronic systolic congestive heart failure ([place]) [I50.23] Acute on chronic systolic CHF (congestive heart failure) ([place]) [I50.23]  1. Acute renal failure on chronic kidney disease stage III: baseline creatinine of 1.43, GFR of 47 on 08/17/19.  Chronic kidney disease secondary to hypertension Acute renal failure secondary to acute cardiorenal syndrome -With oliguria and low blood pressure, hold Entresto.  Discussed with Dr. Clayborn Bigness -If blood pressure still remains low, may  need to adjust the dose of metoprolol   -Can consider restarting low-dose torsemide on Wednesday  2. acute exacerbation of chronic systolic congestive heart failure. Echocardiogram on 9/16 EF of 30%.  - Hold IV furosemide - continue spironolactone - low salt diet, fluid restriction 1500 cc/d  3. Right pleural effusion - thoracentesis completed on October 5.  2.3 L removed   LOS: [place] 10/6/20208:44 AM  [place], [place]  Note: This  note was prepared with Dragon dictation. Any transcription errors are unintentional

## 2019-09-11 NOTE — TOC Initial Note (Addendum)
Transition of Care [facility]) - Initial/Assessment Note    Patient Details  Name: Rick Gilmore MRN: [id number] Date of Birth: 1941/03/21  [facility] CM/SW Contact:    Elza Rafter, RN Phone Number: 09/11/2019, 3:35 PM  Clinical Narrative:     Patient from home alone.  Daughter [name] is at bedside.  Patient admitted Acute on Chronic CHF.  BNP-711.  Current with PCP-[place]; obtains medications at [facility] on [address] without difficulty. Has a functioning scale at home.  He has a CPAP; walker and a cane at home.  Has a Heart Failure Clinic appointment scheduled.  Has used [facility] in the past.  Will he would like to use them again.  Referral for RN, PT.   Would like to start cardiac rehab-will notify Roanna Epley, RN.  Patient continues with SOB.  Will continue to follow.     @1604 -[facility] cannot accept due to staffing; Referral made to [facility] with Corene Cornea as he does not have a preference now.                Patient Goals and CMS Choice        Expected Discharge Plan and Services                                                Prior Living Arrangements/Services                       Activities of Daily Living Home Assistive Devices/Equipment: CPAP, Hearing aid, Eyeglasses ADL Screening (condition at time of admission) Patient's cognitive ability adequate to safely complete daily activities?: Yes Is the patient deaf or have difficulty hearing?: No Does the patient have difficulty seeing, even when wearing glasses/contacts?: No Does the patient have difficulty concentrating, remembering, or making decisions?: No Patient able to express need for assistance with ADLs?: Yes Does the patient have difficulty dressing or bathing?: No Independently performs ADLs?: Yes (appropriate for developmental age) Does the patient have difficulty walking or climbing stairs?: Yes Weakness of Legs: None Weakness of Arms/Hands:  None  Permission Sought/Granted                  Emotional Assessment              Admission diagnosis:  DOE (dyspnea on exertion) [R06.00] Pleural effusion [J90] Acute on chronic systolic congestive heart failure (HCC) [I50.23] Acute on chronic systolic CHF (congestive heart failure) ([place]) [I50.23] Patient Active Problem List   Diagnosis Date Noted  . Hospital discharge follow-up 08/19/2019  . V tach ([place]) 08/09/2019  . Defibrillator discharge 08/09/2019  . V-tach ([place]) 08/09/2019  . Cirrhosis, cryptogenic ([place]) 08/07/2019  . Portal hypertension ([place]) 07/19/2019  . 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 Obesity 04/20/2016  . Hearing loss of left ear due to cerumen impaction 04/19/2016  . Hypotension 03/10/2016  . Allergic rhinitis 02/02/2016  . Acute on chronic systolic CHF (congestive heart failure) ([place]) 01/16/2016  . Chronic renal insufficiency 01/16/2016  . Prediabetes 01/16/2016  . 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 S/P implantation of automatic cardioverter/defibrillator (AICD) 10/21/2015  . Diverticulosis of colon without hemorrhage 10/21/2015  . Internal hemorrhoids 10/21/2015  . Atrial fibrillation ([place]) 10/15/2015  . Tubular adenoma of colon 06/04/2015  . COPD (chronic obstructive pulmonary disease) ([place]) 04/20/2015  .  Iron deficiency anemia 04/13/2015  . Thrombocytopenia ([place]) 12/01/2014  . Hyperlipidemia LDL goal <70 12/01/2014  . Chronic systolic heart failure ([place]) 10/08/2014  . Long term current use of anticoagulant therapy 11/02/2011  . Pernicious anemia 11/02/2011  . Ischemic cardiomyopathy   . Sleep apnea, obstructive    PCP:  Crecencio Mc, MD Pharmacy:   [facility], [place] [address] [place] [address] Phone: [phone] Fax: [facility] [place] #[id number] [place], [place] - [place] [facility] [address] [place] [address] Phone: [phone] Fax: [phone]     Social Determinants of Health (SDOH) Interventions    Readmission Risk Interventions Readmission Risk Prevention Plan 09/11/2019  Transportation Screening Complete  PCP or Specialist Appt within 3-5 Days Complete  HRI or [place] Complete  Social Work Consult for [place] Planning/Counseling Complete  Palliative Care Screening Not Applicable  Medication Review Press photographer) Complete  Some recent data might be hidden

## 2019-09-11 NOTE — Plan of Care (Signed)
  Problem: Clinical Measurements: Goal: Respiratory complications will improve Outcome: Progressing   Problem: Clinical Measurements: Goal: Cardiovascular complication will be avoided Outcome: Progressing   Problem: Pain Managment: Goal: General experience of comfort will improve Outcome: Progressing   Problem: Cardiac: Goal: Ability to achieve and maintain adequate cardiopulmonary perfusion will improve Outcome: Progressing

## 2019-09-11 NOTE — Progress Notes (Addendum)
[place] for heparin drip management  Indication: atrial fibrillation  No Known Allergies  Patient Measurements: Height: 6\' 2"  (188 cm) Weight: 253 lb 12.8 oz (115.1 kg) IBW/kg (Calculated) : 82.2 Heparin Dosing Weight: 108kg   Vital Signs: Temp: 97.9 F (36.6 C) (10/06 0430) Temp Source: Oral (10/06 0430) BP: 104/70 (10/06 0430) Pulse Rate: 73 (10/06 0430)  Labs: Recent Labs    09/08/19 1952 09/08/19 2133 09/09/19 0647 09/09/19 1541 09/10/19 0522  09/10/19 1731 09/11/19 0029 09/11/19 0448  HGB 9.7*  --  9.0*  --  9.3*  --   --   --  9.8*  HCT 28.9*  --  27.2*  --  28.8*  --   --   --  29.9*  PLT 118*  --  100*  --  117*  --   --   --  117*  APTT 48*  --   --   --   --    < > 38* 93* 110*  LABPROT 20.7*  --   --  21.3*  --   --   --   --   --   INR 1.8*  --   --  1.9*  --   --   --   --   --   CREATININE 2.06*  --  1.90*  --  1.80*  --   --   --  1.68*  TROPONINIHS 37* 35*  --   --   --   --   --   --   --    < > = values in this interval not displayed.    Estimated Creatinine Clearance: 49.7 mL/min (A) (by C-G formula based on SCr of 1.68 mg/dL (H)).   Medications:  Scheduled:  . allopurinol  100 mg Oral Daily  . amiodarone  200 mg Oral Daily  . colchicine  0.6 mg Oral Daily  . escitalopram  10 mg Oral Daily  . fluticasone  2 spray Each Nare Daily  . isosorbide mononitrate  30 mg Oral Daily  . metoprolol succinate  50 mg Oral Daily  . montelukast  10 mg Oral QHS  . pantoprazole  40 mg Oral Daily  . potassium chloride SA  20 mEq Oral Daily  . pravastatin  80 mg Oral q1800  . sodium chloride flush  3 mL Intravenous Q12H  . spironolactone  25 mg Oral Daily  . umeclidinium-vilanterol  1 puff Inhalation Daily  . vitamin B-12  1,000 mcg Oral q morning - 10a   Infusions:  . heparin 1,400 Units/hr (09/10/19 1705)    Assessment: Pharmacy consulted for heparin drip management for 78 yo male on apixaban as an outpatient  for atrial fibrillation. Patient with past medical history significant for AICD, anemia, arrhythmia, atrial fibrillation, CAD, prostate cancer, CHF, COPD, GERD, hyperlipidemia, hypertension, MI, and thrombocytopenia. Patient received apixaban 5mg  at 1036 on 10/4. Patient being transitioned to heparin for possible upcoming procedure.  Patient has anemia and thrombocytopenia.  Hgb low but stable, ~9.3  Platelets low prior to start of heparin, ~low 100s.  Goal of Therapy:  Heparin level 0.3-0.7 units/ml when aPTT and HL correlate.  aPTT 66-102 seconds Monitor platelets by anticoagulation protocol: Yes  1005 0700 APTT 128 decreased rate to 1400 units/hr.  1005 1731 aPTT 38  1006 0029 aPTT 93 1006 0500 aPTT 110  Plan:  Per discussion with MD on rounds, will transition patient back to PTA Eliquis and discontinue heparin, as  no more procedures are planned.   Gerald Dexter, PharmD Pharmacy Resident  09/11/2019 7:46 AM

## 2019-09-11 NOTE — Progress Notes (Addendum)
[place] for heparin drip management  Indication: atrial fibrillation  No Known Allergies  Patient Measurements: Height: 6\' 2"  (188 cm) Weight: 259 lb 3.2 oz (117.6 kg) IBW/kg (Calculated) : 82.2 Heparin Dosing Weight: 108kg   Vital Signs: Temp: 97.8 F (36.6 C) (10/05 2001) Temp Source: Oral (10/05 2001) BP: 90/53 (10/05 2001) Pulse Rate: 70 (10/05 2001)  Labs: Recent Labs    09/08/19 1952 09/08/19 2133 09/09/19 0647 09/09/19 1541 09/10/19 0522 09/10/19 0657 09/10/19 1731 09/11/19 0029  HGB 9.7*  --  9.0*  --  9.3*  --   --   --   HCT 28.9*  --  27.2*  --  28.8*  --   --   --   PLT 118*  --  100*  --  117*  --   --   --   APTT 48*  --   --   --   --  128* 38* 93*  LABPROT 20.7*  --   --  21.3*  --   --   --   --   INR 1.8*  --   --  1.9*  --   --   --   --   CREATININE 2.06*  --  1.90*  --  1.80*  --   --   --   TROPONINIHS 37* 35*  --   --   --   --   --   --     Estimated Creatinine Clearance: 46.9 mL/min (A) (by C-G formula based on SCr of 1.8 mg/dL (H)).   Medications:  Scheduled:  . allopurinol  100 mg Oral Daily  . amiodarone  200 mg Oral Daily  . colchicine  0.6 mg Oral Daily  . escitalopram  10 mg Oral Daily  . fluticasone  2 spray Each Nare Daily  . isosorbide mononitrate  30 mg Oral Daily  . metoprolol succinate  50 mg Oral Daily  . montelukast  10 mg Oral QHS  . pantoprazole  40 mg Oral Daily  . potassium chloride SA  20 mEq Oral Daily  . pravastatin  80 mg Oral q1800  . sodium chloride flush  3 mL Intravenous Q12H  . spironolactone  25 mg Oral Daily  . umeclidinium-vilanterol  1 puff Inhalation Daily  . vitamin B-12  1,000 mcg Oral q morning - 10a   Infusions:  . heparin 1,400 Units/hr (09/10/19 1705)    Assessment: Pharmacy consulted for heparin drip management for 78 yo male on apixaban as an outpatient for atrial fibrillation. Patient with past medical history significant for AICD, anemia,  arrhythmia, atrial fibrillation, CAD, prostate cancer, CHF, COPD, GERD, hyperlipidemia, hypertension, MI, and thrombocytopenia. Patient received apixaban 5mg  at 1036 on 10/4. Patient being transitioned to heparin for possible upcoming procedure.  Hgb low but stable, 9.0 --> 9.3.  Platelets low prior to start of heparin, 118 --> 100 --> 117.  Goal of Therapy:  Heparin level 0.3-0.7 units/ml when aPTT and HL correlate.  aPTT 66-102 seconds Monitor platelets by anticoagulation protocol: Yes  1005 0700 APTT 128 decreased rate to 1400 units/hr.  1005 1731 aPTT 38   Plan:  10/06 @ 0029 aPTT 93 seconds therapeutic. Will continue current rate and will recheck aPTT/HL - (to assess correlation w/ aPTT) w/ am labs. Will f/u w/ am CBC as well.  Pharmacy will continue to monitor platelets and CBC per protocol.  Tobie Lords, PharmD, BCPS 09/11/2019,3:02 AM

## 2019-09-11 NOTE — Plan of Care (Signed)

## 2019-09-11 NOTE — Progress Notes (Signed)
[facility] [place] NAME: Rick Gilmore    MR#:  [id number]  DATE OF BIRTH:  11/12/1941  SUBJECTIVE:  CHIEF COMPLAINT:   Chief Complaint  Patient presents with  . Shortness of Breath   The patient has better shortness of breath and leg swelling.  REVIEW OF SYSTEMS:  Review of Systems  Constitutional: Negative for chills, fever and malaise/fatigue.  HENT: Negative for sore throat.   Eyes: Negative for blurred vision and double vision.  Respiratory: Positive for shortness of breath. Negative for cough, hemoptysis, sputum production, wheezing and stridor.   Cardiovascular: Positive for leg swelling. Negative for chest pain, palpitations and orthopnea.  Gastrointestinal: Negative for abdominal pain, blood in stool, diarrhea, melena, nausea and vomiting.  Genitourinary: Negative for dysuria, flank pain and hematuria.  Musculoskeletal: Negative for back pain and joint pain.  Skin: Negative for rash.  Neurological: Negative for dizziness, sensory change, focal weakness, seizures, loss of consciousness, weakness and headaches.  Endo/Heme/Allergies: Negative for polydipsia.  Psychiatric/Behavioral: Negative for depression. The patient is not nervous/anxious.     DRUG ALLERGIES:  No Known Allergies VITALS:  Blood pressure 107/60, pulse 75, temperature 98.1 F (36.7 C), temperature source Oral, resp. rate 19, height 6\' 2"  (1.88 m), weight 115.1 kg, SpO2 96 %. PHYSICAL EXAMINATION:  Physical Exam Constitutional:      General: He is not in acute distress.    Appearance: Normal appearance.  HENT:     Head: Normocephalic.     Mouth/Throat:     Mouth: Mucous membranes are moist.  Eyes:     General: No scleral icterus.    Conjunctiva/sclera: Conjunctivae normal.     Pupils: Pupils are equal, round, and reactive to light.  Neck:     Musculoskeletal: Normal range of motion and neck supple.     Vascular: No JVD.     Trachea: No tracheal  deviation.  Cardiovascular:     Rate and Rhythm: Normal rate and regular rhythm.     Heart sounds: Normal heart sounds. No murmur. No gallop.   Pulmonary:     Effort: Pulmonary effort is normal. No respiratory distress.     Breath sounds: Rales present. No wheezing.  Abdominal:     General: Bowel sounds are normal. There is no distension.     Palpations: Abdomen is soft.     Tenderness: There is no abdominal tenderness. There is no rebound.  Musculoskeletal: Normal range of motion.        General: No tenderness.     Right lower leg: Edema present.     Left lower leg: Edema present.  Skin:    Findings: No erythema or rash.  Neurological:     General: No focal deficit present.     Mental Status: He is alert and oriented to person, place, and time.     Cranial Nerves: No cranial nerve deficit.  Psychiatric:        Mood and Affect: Mood normal.    LABORATORY PANEL:  Male CBC Recent Labs  Lab 09/11/19 0448  WBC 6.0  HGB 9.8*  HCT 29.9*  PLT 117*   ------------------------------------------------------------------------------------------------------------------ Chemistries  Recent Labs  Lab 09/10/19 0522 09/11/19 0448  NA 137 133*  K 3.9 3.9  CL 100 98  CO2 27 27  GLUCOSE 111* 110*  BUN 24* 23  CREATININE 1.80* 1.68*  CALCIUM 8.8* 8.8*  MG 2.3  --    RADIOLOGY:  Dg Chest 1 View  Result Date: 09/10/2019 CLINICAL DATA:  Status post right thoracentesis EXAM: CHEST  1 VIEW COMPARISON:  09/10/2019 FINDINGS: Interval right thoracentesis has been performed with resolution of the previously seen right-sided pleural effusion. No pneumothorax is noted. The left lung is clear. Defibrillator is again noted and stable. Postsurgical changes are seen and stable. Vascular congestion has improved from the earlier exam. IMPRESSION: No pneumothorax following right-sided thoracentesis. No residual fluid is seen. Previously seen vascular congestion has improved in the interval.  Electronically Signed   By: Inez Catalina M.D.   On: 09/10/2019 15:24   US Thoracentesis Asp Pleural Space W/img Guide  Result Date: 09/10/2019 INDICATION: Right-sided pleural effusion EXAM: ULTRASOUND GUIDED RIGHT THORACENTESIS MEDICATIONS: None. COMPLICATIONS: None immediate. PROCEDURE: An ultrasound guided thoracentesis was thoroughly discussed with the patient and questions answered. The benefits, risks, alternatives and complications were also discussed. The patient understands and wishes to proceed with the procedure. Written consent was obtained. Ultrasound was performed to localize and mark an adequate pocket of fluid in the right chest. The area was then prepped and draped in the normal sterile fashion. 1% Lidocaine was used for local anesthesia. Under ultrasound guidance a 6 Fr Safe-T-Centesis catheter was introduced. Thoracentesis was performed. The catheter was removed and a dressing applied. FINDINGS: A total of approximately 2.3 L of clear yellow fluid was removed. Samples were sent to the laboratory as requested by the clinical team. IMPRESSION: Successful ultrasound guided right thoracentesis yielding 2.3 L of pleural fluid. Electronically Signed   By: Inez Catalina M.D.   On: 09/10/2019 15:23   ASSESSMENT AND PLAN:    Acute on chronic systolic CHF (congestive heart failure) ejection fraction 30%. He was treated with IV Lasix and changed to torsemide.  Hold torsemide and decreased IV Lasix 40 mg twice daily per Dr. Clayborn Bigness.   Hold torsemide today and restart tomorrow per Dr. Candiss Norse. continue spironolactone, Toprol and Entresto.  Hold if her blood pressure is low.  Pleural effusion. Interval small to moderate-sized right pleural effusion. Repeated chest x-ray does not show improvement. S/p arthrocentesis with 2.3 L fluid withdrawal.  Resume Eliquis.    COPD (chronic obstructive pulmonary disease) ([place]) -continue home meds   Atrial fibrillation ([place]) -continue amiodarone and Toprol,  Resume Eliquis.   Sleep apnea, obstructive -BiPAP nightly   CKD stage III-at baseline, stable.  Avoid nephrotoxins and follow-up BMP.   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 Anemia of chronic disease.  Stable.  I discussed with Dr. Candiss Norse and Dr. Clayborn Bigness. All the records are reviewed and case discussed with Care Management/Social Worker. Management plans discussed with the patient, his daughter and they are in agreement.  CODE STATUS: Full Code  TOTAL TIME TAKING CARE OF THIS PATIENT: 33 minutes.   More than 50% of the time was spent in counseling/coordination of care: YES  POSSIBLE D/C IN 2 DAYS, DEPENDING ON CLINICAL CONDITION.   Demetrios Loll M.D on 09/11/2019 at 12:02 PM  Between 7am to 6pm - Pager - [phone]  After 6pm go to [email or web address] - [facility]

## 2019-09-12 ENCOUNTER — Inpatient Hospital Stay: Payer: Medicare Other

## 2019-09-12 LAB — BASIC METABOLIC PANEL
Anion gap: 9 (ref 5–15)
BUN: 20 mg/dL (ref 8–23)
CO2: 24 mmol/L (ref 22–32)
Calcium: 9 mg/dL (ref 8.9–10.3)
Chloride: 102 mmol/L (ref 98–111)
Creatinine, Ser: 1.49 mg/dL — ABNORMAL HIGH (ref 0.61–1.24)
GFR calc Af Amer: 52 mL/min — ABNORMAL LOW (ref >60)
GFR calc non Af Amer: 45 mL/min — ABNORMAL LOW (ref >60)
Glucose, Bld: 108 mg/dL — ABNORMAL HIGH (ref 70–99)
Potassium: 4.1 mmol/L (ref 3.5–5.1)
Sodium: 135 mmol/L (ref 135–145)

## 2019-09-12 LAB — CBC
HCT: 27.8 % — ABNORMAL LOW (ref 39.0–52.0)
Hemoglobin: 9.3 g/dL — ABNORMAL LOW (ref 13.0–17.0)
MCH: 31.6 pg (ref 26.0–34.0)
MCHC: 33.5 g/dL (ref 30.0–36.0)
MCV: 94.6 fL (ref 80.0–100.0)
Platelets: 100 10*3/uL — ABNORMAL LOW (ref 150–400)
RBC: 2.94 MIL/uL — ABNORMAL LOW (ref 4.22–5.81)
RDW: 14.5 % (ref 11.5–15.5)
WBC: 6.9 10*3/uL (ref 4.0–10.5)
nRBC: 0 % (ref 0.0–0.2)

## 2019-09-12 LAB — PH, BODY FLUID: pH, Body Fluid: 7.5

## 2019-09-12 LAB — MAGNESIUM: Magnesium: 2.3 mg/dL (ref 1.7–2.4)

## 2019-09-12 MED ORDER — TORSEMIDE 20 MG PO TABS
20.0000 mg | ORAL_TABLET | Freq: Every day | ORAL | Status: DC
  Administered 2019-09-12 – 2019-09-13 (×2): 20 mg via ORAL
  Filled 2019-09-12 (×2): qty 1

## 2019-09-12 MED ORDER — ENSURE ENLIVE PO LIQD
237.0000 mL | Freq: Two times a day (BID) | ORAL | Status: DC
  Administered 2019-09-12 – 2019-09-13 (×2): 237 mL via ORAL

## 2019-09-12 MED ORDER — IPRATROPIUM-ALBUTEROL 0.5-2.5 (3) MG/3ML IN SOLN: 3.0000 mL | Freq: Four times a day (QID) | RESPIRATORY_TRACT | Status: DC | PRN

## 2019-09-12 NOTE — Plan of Care (Signed)
Nutrition Education Note  RD consulted for nutrition education regarding CHF.  78 y.o.caucsian male with hypertension, gout, depression, coronary artery disease, COPD, congestive heart failure, atrial fibrillation, AICD, prostate cancer, GERD, obstructive sleep apnea, thrombocytopenia, who is admitted to [facility] on 09/08/2019 for DOE (dyspnea on exertion)   Spoke with patient via phone. Pt reports fairly good appetite and oral intake at baseline. Pt reports his appetite has been decreased over the past month but reports that he is starting to eat better now. Pt reports eating "a good amount of my meals in hospital". Pt ate 100% of his breakfast today but reports that he only ate a cookie for lunch as he was still full from breakfast. Pt is willing to drink Ensure supplements while in hospital to help him meet his protein needs.   Spoke with patient regarding a low sodium diet. Pt reports that he mostly follows a low sodium diet at home as he does not like things really salty.   RD provided "Low Sodium Nutrition Therapy" handout from the Academy of [employer]. Reviewed patient's dietary recall. Provided examples on ways to decrease sodium intake in diet. Discouraged intake of processed foods and use of salt shaker. Encouraged fresh fruits and vegetables as well as whole grain sources of carbohydrates to maximize fiber intake.   RD discussed why it is important for patient to adhere to diet recommendations, and emphasized the role of fluids, foods to avoid, and importance of weighing self daily. Teach back method used.  Expect good compliance.  Body mass index is 32.74 kg/m. Pt meets criteria for obesity based on current BMI.  Current diet order is HH/CHO, patient is consuming approximately 25-100% of meals at this time. Labs and medications reviewed.   RD will add Ensure Enlive po BID, each supplement provides 350 kcal and 20 grams of protein  No further nutrition interventions  warranted at this time. RD contact information provided. If additional nutrition issues arise, please re-consult RD.   Koleen Distance MS, RD, LDN Pager #- [phone] Office#- [phone] After Hours Pager: [phone]

## 2019-09-12 NOTE — Evaluation (Signed)
Physical Therapy Evaluation Patient Details Name: Rick Gilmore MRN: [id number] DOB: Oct 26, 1941 Today's Date: 09/12/2019   History of Present Illness  Pt is a 78 year old male admitted with CHF exacerbation.  PMH includes MI, COPD, CHF and CA.  Clinical Impression  Pt is a 78 year old male who lives in a one story house alone.  He is independent at baseline but has experienced several hospital admissions in the past month; reports no falls.  Pt independent with bed mobility and transfers, presented with good overall UE/LE strength.  He reported no N/T in extremities.  Pt able to perform static stance balance activities without difficulty but presented with mild balance deficits during dynamic gait activity.  Pt became fatigued after 1-2 min of standing activity and required a seated rest break to recover, usually ~1 min.  Pt open to all education and able to follow directions consistently.  His O2 level remained WNL throughout, with pt becoming mildly tachycardic with activity.  Pt will continue to benefit from skilled PT with focus on tolerance to activity and safe functional mobility.  Pt may benefit from Cardiac/pulmonary rehab to address pt's poor tolerance to activity at this time.    Follow Up Recommendations Home health PT(Pt may better benefit from Cardiac/pulmonary rehab. at this time.)    Equipment Recommendations  None recommended by PT    Recommendations for Other Services       Precautions / Restrictions Precautions Precautions: None Restrictions Weight Bearing Restrictions: No      Mobility  Bed Mobility Overal bed mobility: Independent                Transfers Overall transfer level: Independent                  Ambulation/Gait Ambulation/Gait assistance: Supervision Gait Distance (Feet): 75 Feet       Gait velocity interpretation: 1.31 - 2.62 ft/sec, indicative of limited community ambulator General Gait Details: Decreased step length with wider  BOS, pt becomes SOB after standing 1-2 min and needs a rest break.  Remained mildly tachycardic throughout.  Stairs            Wheelchair Mobility    Modified Rankin (Stroke Patients Only)       Balance Overall balance assessment: Independent   Sitting balance-Leahy Scale: Normal       Standing balance-Leahy Scale: Good   Single Leg Stance - Right Leg: 4 Single Leg Stance - Left Leg: 5 Tandem Stance - Right Leg: 3 Tandem Stance - Left Leg: 6 Rhomberg - Eyes Opened: 10 Rh[place]berg - Eyes Closed: 10 High level balance activites: Head turns;Direction changes High Level Balance Comments: Mild decrease in gait speed with head turns but no LOB's.             Pertinent Vitals/Pain Pain Assessment: No/denies pain    Home Living Family/patient expects to be discharged to:: Private residence Living Arrangements: Alone Available Help at Discharge: Family Type of Home: House Home Access: Stairs to enter Entrance Stairs-Rails: Can reach both Entrance Stairs-Number of Steps: 4 Home Layout: One level Home Equipment: Bedside commode      Prior Function Level of Independence: Independent         Comments: Prior to the past month of hospital admissions, pt was a limited community ambulator with use of electric cart at grocery store and no AD for support.     Hand Dominance        Extremity/Trunk Assessment  Upper Extremity Assessment Upper Extremity Assessment: Overall WFL for tasks assessed(Grip, elbow flex/ext, shoulder flex: 4+/5.)    Lower Extremity Assessment Lower Extremity Assessment: Overall WFL for tasks assessed(Ankle DF/PF, knee flex/ext, hip flex: 4+/5 bilat.)    Cervical / Trunk Assessment Cervical / Trunk Assessment: Normal  Communication   Communication: No difficulties;HOH  Cognition Arousal/Alertness: Awake/alert Behavior During Therapy: WFL for tasks assessed/performed Overall Cognitive Status: Within Functional Limits for tasks  assessed                                        General Comments      Exercises Other Exercises Other Exercises: Education: [place] PT vs Cardiac/pulmonary rehab and benefit. x4 min   Assessment/Plan    PT Assessment Patient needs continued PT services  PT Problem List Decreased mobility;Decreased activity tolerance;Decreased balance;Cardiopulmonary status limiting activity       PT Treatment Interventions DME instruction;Therapeutic activities;Gait training;Therapeutic exercise;Patient/family education;Stair training;Balance training;Functional mobility training    PT Goals (Current goals can be found in the Care Plan section)  Acute Rehab PT Goals Patient Stated Goal: To return to his activity level prior to past month of hospital admissions. PT Goal Formulation: With patient Time For Goal Achievement: 09/26/19 Potential to Achieve Goals: Good    Frequency Min 2X/week   Barriers to discharge        Co-evaluation               AM-PAC PT "6 Clicks" Mobility  Outcome Measure Help needed turning from your back to your side while in a flat bed without using bedrails?: None Help needed moving from lying on your back to sitting on the side of a flat bed without using bedrails?: None Help needed moving to and from a bed to a chair (including a wheelchair)?: None Help needed standing up from a chair using your arms (e.g., wheelchair or bedside chair)?: None Help needed to walk in hospital room?: A Little Help needed climbing 3-5 steps with a railing? : A Little 6 Click Score: 22    End of Session Equipment Utilized During Treatment: Gait belt Activity Tolerance: Other (comment)(Limited by SOB) Patient left: in bed;with call bell/phone within reach;with bed alarm set;with family/visitor present   PT Visit Diagnosis: Unsteadiness on feet (R26.81);Difficulty in walking, not elsewhere classified (R26.2)    Time: 9758-8325 PT Time Calculation (min) (ACUTE  ONLY): 20 min   Charges:   PT Evaluation $PT Eval Low Complexity: 1 Low         Roxanne Gates, PT, DPT   Roxanne Gates 09/12/2019, 12:57 PM

## 2019-09-12 NOTE — Progress Notes (Signed)
[facility], [place] 09/12/19  Subjective:   10/06 0701 - 10/07 0700 In: 763.8 [P.O.:720; I.V.:43.8] Out: 925 [Urine:925]\  Patient underwent right thoracentesis this admission 2.3 L of fluid was removed Patient states he feels ok. Still reports DOE Blood pressure is still low normal at 110/60 No significant  leg edema   Serum creatinine improved today to 1.5  Objective:  Vital signs in last 24 hours:  Temp:  [97.9 F (36.6 C)-98.2 F (36.8 C)] 98 F (36.7 C) (10/07 0456) Pulse Rate:  [70] 70 (10/07 0456) Resp:  [18-20] 18 (10/07 0456) BP: (101-105)/(56-64) 105/56 (10/07 0456) SpO2:  [94 %-100 %] 94 % (10/07 0456) Weight:  [115.7 kg] 115.7 kg (10/07 0456)  Weight change: 0.544 kg Filed Weights   09/10/19 0413 09/11/19 0430 09/12/19 0456  Weight: 117.6 kg 115.1 kg 115.7 kg    Intake/Output:    Intake/Output Summary (Last 24 hours) at 09/12/2019 0948 Last data filed at 09/12/2019 0943 Gross per 24 hour  Intake 523.8 ml  Output 1105 ml  Net -581.2 ml     Physical Exam: General:  No acute distress, standing up  HEENT  anicteric, moist oral mucous membranes  Pulm/lungs  normal breathing effort on room air, clear to auscultation  CVS/Heart  regular rhythm  Abdomen:   Soft, nontender  Extremities:  Trace edema  Neurologic:  Alert, oriented    Basic Metabolic Panel:  Recent Labs  Lab 09/08/19 1952 09/09/19 0647 09/10/19 0522 09/11/19 0448 09/12/19 0540  NA 134* 135 137 133* 135  K 3.7 3.6 3.9 3.9 4.1  CL 100 101 100 98 102  CO2 24 27 27 27 24   GLUCOSE 106* 106* 111* 110* 108*  BUN 27* 26* 24* 23 20  CREATININE 2.06* 1.90* 1.80* 1.68* 1.49*  CALCIUM 8.7* 8.4* 8.8* 8.8* 9.0  MG  --   --  2.3  --  2.3     CBC: Recent Labs  Lab 09/08/19 1952 09/09/19 0647 09/10/19 0522 09/11/19 0448 09/12/19 0540  WBC 6.2 4.8 5.6 6.0 6.9  NEUTROABS 4.9  --   --   --   --   HGB 9.7* 9.0* 9.3* 9.8* 9.3*  HCT 28.9* 27.2* 28.8* 29.9*  27.8*  MCV 94.1 94.1 95.0 94.3 94.6  PLT 118* 100* 117* 117* 100*      Lab Results  Component Value Date   HEPBSAG NON REACTIVE 09/10/2019   HEPBSAB NON REACTIVE 09/10/2019   HEPBIGM NON REACTIVE 09/10/2019      Microbiology:  Recent Results (from the past 240 hour(s))  SARS Coronavirus 2 [facility] order, Performed in [facility] hospital lab) Nasopharyngeal Nasopharyngeal Swab     Status: None   Collection Time: 09/08/19  7:57 PM   Specimen: Nasopharyngeal Swab  Result Value Ref Range Status   SARS Coronavirus 2 NEGATIVE NEGATIVE Final    Comment: (NOTE) If result is NEGATIVE SARS-CoV-2 target nucleic acids are NOT DETECTED. The SARS-CoV-2 RNA is generally detectable in upper and lower  respiratory specimens during the acute phase of infection. The lowest  concentration of SARS-CoV-2 viral copies this assay can detect is 250  copies / mL. A negative result does not preclude SARS-CoV-2 infection  and should not be used as the sole basis for treatment or other  patient management decisions.  A negative result may occur with  improper specimen collection / handling, submission of specimen other  than nasopharyngeal swab, presence of viral mutation(s) within the  areas targeted by  this assay, and inadequate number of viral copies  (<250 copies / mL). A negative result must be combined with clinical  observations, patient history, and epidemiological information. If result is POSITIVE SARS-CoV-2 target nucleic acids are DETECTED. The SARS-CoV-2 RNA is generally detectable in upper and lower  respiratory specimens dur ing the acute phase of infection.  Positive  results are indicative of active infection with SARS-CoV-2.  Clinical  correlation with patient history and other diagnostic information is  necessary to determine patient infection status.  Positive results do  not rule out bacterial infection or co-infection with other viruses. If result is PRESUMPTIVE  POSTIVE SARS-CoV-2 nucleic acids MAY BE PRESENT.   A presumptive positive result was obtained on the submitted specimen  and confirmed on repeat testing.  While 2019 novel coronavirus  (SARS-CoV-2) nucleic acids may be present in the submitted sample  additional confirmatory testing may be necessary for epidemiological  and / or clinical management purposes  to differentiate between  SARS-CoV-2 and other Sarbecovirus currently known to infect humans.  If clinically indicated additional testing with an alternate test  methodology [phone]) is advised. The SARS-CoV-2 RNA is generally  detectable in upper and lower respiratory sp ecimens during the acute  phase of infection. The expected result is Negative. Fact Sheet for Patients:  StrictlyIdeas.no Fact Sheet for Healthcare Providers: BankingDealers.co.za This test is not yet approved or cleared by the [place] FDA and has been authorized for detection and/or diagnosis of SARS-CoV-2 by FDA under an Emergency Use Authorization (EUA).  This EUA will remain in effect (meaning this test can be used) for the duration of the COVID-19 declaration under Section 564(b)(1) of the Act, 21 U.S.C. section 360bbb-3(b)(1), unless the authorization is terminated or revoked sooner. Performed at [facility], [address] [place], [place] [address]     Coagulation Studies: Recent Labs    09/09/19 1541  LABPROT 21.3*  INR 1.9*    Urinalysis: Recent Labs    09/09/19 1811  COLORURINE STRAW*  LABSPEC 1.006  PHURINE 7.0  GLUCOSEU NEGATIVE  HGBUR NEGATIVE  BILIRUBINUR NEGATIVE  KETONESUR NEGATIVE  PROTEINUR NEGATIVE  NITRITE NEGATIVE  LEUKOCYTESUR NEGATIVE      Imaging: Dg Chest 1 View  Result Date: 09/10/2019 CLINICAL DATA:  Status post right thoracentesis EXAM: CHEST  1 VIEW COMPARISON:  09/10/2019 FINDINGS: Interval right thoracentesis has been performed with resolution  of the previously seen right-sided pleural effusion. No pneumothorax is noted. The left lung is clear. Defibrillator is again noted and stable. Postsurgical changes are seen and stable. Vascular congestion has improved from the earlier exam. IMPRESSION: No pneumothorax following right-sided thoracentesis. No residual fluid is seen. Previously seen vascular congestion has improved in the interval. Electronically Signed   By: Inez Catalina M.D.   On: 09/10/2019 15:24   US Thoracentesis Asp Pleural Space W/img Guide  Result Date: 09/10/2019 INDICATION: Right-sided pleural effusion EXAM: ULTRASOUND GUIDED RIGHT THORACENTESIS MEDICATIONS: None. COMPLICATIONS: None immediate. PROCEDURE: An ultrasound guided thoracentesis was thoroughly discussed with the patient and questions answered. The benefits, risks, alternatives and complications were also discussed. The patient understands and wishes to proceed with the procedure. Written consent was obtained. Ultrasound was performed to localize and mark an adequate pocket of fluid in the right chest. The area was then prepped and draped in the normal sterile fashion. 1% Lidocaine was used for local anesthesia. Under ultrasound guidance a 6 Fr Safe-T-Centesis catheter was introduced. Thoracentesis was performed. The catheter was removed and a  dressing applied. FINDINGS: A total of approximately 2.3 L of clear yellow fluid was removed. Samples were sent to the laboratory as requested by the clinical team. IMPRESSION: Successful ultrasound guided right thoracentesis yielding 2.3 L of pleural fluid. Electronically Signed   By: Inez Catalina M.D.   On: 09/10/2019 15:23     Medications:       Current Facility-Administered Medications (Cardiovascular):  .  amiodarone (PACERONE) tablet 200 mg .  isosorbide mononitrate (IMDUR) 24 hr tablet 30 mg .  metoprolol succinate (TOPROL-XL) 24 hr tablet 50 mg .  pravastatin (PRAVACHOL) tablet 80 mg .  spironolactone (ALDACTONE)  tablet 25 mg   Current Facility-Administered Medications (Respiratory):  .  fluticasone (FLONASE) 50 MCG/ACT nasal spray 2 spray .  montelukast (SINGULAIR) tablet 10 mg .  umeclidinium-vilanterol (ANORO ELLIPTA) 62.5-25 MCG/INH 1 puff   Current Facility-Administered Medications (Analgesics):  .  acetaminophen (TYLENOL) tablet 650 mg **OR** acetaminophen (TYLENOL) suppository 650 mg .  allopurinol (ZYLOPRIM) tablet 100 mg .  colchicine tablet 0.6 mg   Current Facility-Administered Medications (Hematological):  .  apixaban (ELIQUIS) tablet 5 mg .  vitamin B-12 (CYANOCOBALAMIN) tablet 1,000 mcg   Current Facility-Administered Medications (Other):  .  escitalopram (LEXAPRO) tablet 10 mg .  ondansetron (ZOFRAN) tablet 4 mg **OR** ondansetron (ZOFRAN) injection 4 mg .  pantoprazole (PROTONIX) EC tablet 40 mg .  potassium chloride SA (KLOR-CON) CR tablet 20 mEq .  sodium chloride flush (NS) 0.9 % injection 3 mL  No current outpatient medications on file.     Assessment/ Plan:  78 y.o.caucsian male with hypertension, gout, depression, coronary artery disease, COPD, congestive heart failure, atrial fibrillation, AICD, prostate cancer, GERD, obstructive sleep apnea, thrombocytopenia, who is admitted to [facility] on 09/08/2019 for DOE (dyspnea on exertion) [R06.00] Pleural effusion [J90] Acute on chronic systolic congestive heart failure ([place]) [I50.23] Acute on chronic systolic CHF (congestive heart failure) ([place]) [I50.23]  1. Acute renal failure on chronic kidney disease stage III: baseline creatinine of 1.43, GFR of 47 on 08/17/19.  Chronic kidney disease secondary to hypertension Acute renal failure secondary to acute cardiorenal syndrome - With oliguria and low blood pressure, hold Entresto.  Discussed with Dr. Clayborn Bigness - will restart low-dose torsemide at 20 mg daily  2. acute exacerbation of chronic systolic congestive heart failure. Echocardiogram on 9/16 EF of 30%.  -start  torsemide - continue spironolactone - low salt diet, fluid restriction 1500 cc/d (approx) - will need PT evaluation   3. Right pleural effusion - thoracentesis completed on October 5.  2.3 L removed   LOS: [place] 10/7/20209:48 AM  [place], [place]  Note: This note was prepared with Dragon dictation. Any transcription errors are unintentional

## 2019-09-12 NOTE — Progress Notes (Signed)
[place] [facility] [place] NAME: Rick Gilmore    MR#:  [id number]  DATE OF BIRTH:  May 26, 1941  SUBJECTIVE:  CHIEF COMPLAINT:   Chief Complaint  Patient presents with  . Shortness of Breath   The patient has better shortness of breath and leg swelling.  he have dyspnea on exertion still. REVIEW OF SYSTEMS:  Review of Systems  Constitutional: Negative for chills, fever and malaise/fatigue.  HENT: Negative for sore throat.   Eyes: Negative for blurred vision and double vision.  Respiratory: Positive for shortness of breath. Negative for cough, hemoptysis, sputum production, wheezing and stridor.   Cardiovascular: Positive for leg swelling. Negative for chest pain, palpitations and orthopnea.  Gastrointestinal: Negative for abdominal pain, blood in stool, diarrhea, melena, nausea and vomiting.  Genitourinary: Negative for dysuria, flank pain and hematuria.  Musculoskeletal: Negative for back pain and joint pain.  Skin: Negative for rash.  Neurological: Negative for dizziness, sensory change, focal weakness, seizures, loss of consciousness, weakness and headaches.  Endo/Heme/Allergies: Negative for polydipsia.  Psychiatric/Behavioral: Negative for depression. The patient is not nervous/anxious.     DRUG ALLERGIES:  No Known Allergies VITALS:  Blood pressure 110/60, pulse 74, temperature 98 F (36.7 C), resp. rate 19, height 6\' 2"  (1.88 m), weight 115.7 kg, SpO2 94 %. PHYSICAL EXAMINATION:  Physical Exam Constitutional:      General: He is not in acute distress.    Appearance: Normal appearance.  HENT:     Head: Normocephalic.     Mouth/Throat:     Mouth: Mucous membranes are moist.  Eyes:     General: No scleral icterus.    Conjunctiva/sclera: Conjunctivae normal.     Pupils: Pupils are equal, round, and reactive to light.  Neck:     Musculoskeletal: Normal range of motion and neck supple.     Vascular: No JVD.     Trachea: No  tracheal deviation.  Cardiovascular:     Rate and Rhythm: Normal rate and regular rhythm.     Heart sounds: Normal heart sounds. No murmur. No gallop.   Pulmonary:     Effort: Pulmonary effort is normal. No respiratory distress.     Breath sounds: Rales present. No wheezing.  Abdominal:     General: Bowel sounds are normal. There is no distension.     Palpations: Abdomen is soft.     Tenderness: There is no abdominal tenderness. There is no rebound.  Musculoskeletal: Normal range of motion.        General: No tenderness.     Right lower leg: No edema.     Left lower leg: No edema.  Skin:    Findings: No erythema or rash.  Neurological:     General: No focal deficit present.     Mental Status: He is alert and oriented to person, place, and time.     Cranial Nerves: No cranial nerve deficit.  Psychiatric:        Mood and Affect: Mood normal.    LABORATORY PANEL:  Male CBC Recent Labs  Lab 09/12/19 0540  WBC 6.9  HGB 9.3*  HCT 27.8*  PLT 100*   ------------------------------------------------------------------------------------------------------------------ Chemistries  Recent Labs  Lab 09/12/19 0540  NA 135  K 4.1  CL 102  CO2 24  GLUCOSE 108*  BUN 20  CREATININE 1.49*  CALCIUM 9.0  MG 2.3   RADIOLOGY:  No results found. ASSESSMENT AND PLAN:    Acute on chronic systolic CHF (  congestive heart failure) ejection fraction 30%. He was treated with IV Lasix and changed to torsemide.  Hold torsemide and decreased IV Lasix 40 mg twice daily per Dr. Clayborn Bigness.   Held torsemide yesterday, now resume per Dr. Candiss Norse. continue spironolactone, Toprol and Entresto.    Pleural effusion. Interval small to moderate-sized right pleural effusion. S/p arthrocentesis with 2.3 L fluid withdrawal.  Resume Eliquis. Repeat x-ray now again because of worsening dyspnea on exertion.    COPD (chronic obstructive pulmonary disease) ([place]) -continue home meds      Added nebulizer therapy  as patient still feels short of breath on exertion.   Atrial fibrillation ([place]) -continue amiodarone and Toprol, Resume Eliquis.   Sleep apnea, obstructive -BiPAP nightly   CKD stage III-at baseline, stable.  Avoid nephrotoxins and follow-up BMP.   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 Anemia of chronic disease.  Stable.  I discussed with Dr. Candiss Norse and Dr. Clayborn Bigness.  Patient's daughter was present in the room during my visit. All the records are reviewed and case discussed with Care Management/Social Worker. Management plans discussed with the patient, his daughter and they are in agreement.  CODE STATUS: Full Code  TOTAL TIME TAKING CARE OF THIS PATIENT: 33 minutes.   More than 50% of the time was spent in counseling/coordination of care: YES  POSSIBLE D/C IN 1-2 DAYS, DEPENDING ON CLINICAL CONDITION.   Vaughan Basta M.D on 09/12/2019 at 2:28 PM  Between 7am to 6pm - Pager - [phone]  After 6pm go to [email or web address] - [facility]

## 2019-09-12 NOTE — Telephone Encounter (Signed)
Verbal orders given  

## 2019-09-13 LAB — CYTOLOGY - NON PAP

## 2019-09-13 MED ORDER — TORSEMIDE 20 MG PO TABS: 20.0000 mg | ORAL_TABLET | Freq: Every day | ORAL | 0 refills | Status: DC

## 2019-09-13 MED ORDER — METOPROLOL SUCCINATE ER 50 MG PO TB24: 50.0000 mg | ORAL_TABLET | Freq: Every day | ORAL | 0 refills | Status: DC

## 2019-09-13 MED ORDER — AMIODARONE HCL 200 MG PO TABS: 200.0000 mg | ORAL_TABLET | Freq: Every day | ORAL | 0 refills | Status: DC

## 2019-09-13 NOTE — TOC Transition Note (Signed)
Transition of Care [facility]) - CM/SW Discharge Note   Patient Details  Name: Rick Gilmore MRN: [id number] Date of Birth: 04/16/1941  [facility]) CM/SW Contact:  Elza Rafter, RN Phone Number: 09/13/2019, 1:49 PM   Clinical Narrative:   Patient is discharging today.  Notified Corene Cornea with [place].  No further needs identified.      Final next level of care: [place] Barriers to Discharge: No Barriers Identified   Patient Goals and CMS Choice Patient states their goals for this hospitalization and ongoing recovery are:: wants to go home CMS Medicare.gov Compare Post Acute Care list provided to:: Patient Choice offered to / list presented to : Patient  Discharge Placement                       Discharge Plan and Services   Discharge Planning Services: CM Consult Post Acute Care Choice: Home Health                    HH Arranged: RN, PT [facility] Agency: [place] ([place]) Date [facility] Agency Contacted: 09/11/19 Time [place]: [place] Representative spoke with at [place]: [place] ([place]) Interventions     Readmission Risk Interventions Readmission Risk Prevention Plan 09/11/2019  Transportation Screening Complete  PCP or Specialist Appt within 3-5 Days Complete  HRI or [place] Complete  Social Work Consult for [place] Planning/Counseling Complete  Palliative Care Screening Not Applicable  Medication Review [employer]) Complete  Some recent data might be hidden

## 2019-09-13 NOTE — Progress Notes (Signed)
[facility], [place] 09/13/19  Subjective:   10/07 0701 - 10/08 0700 In: -  Out: 580 [Urine:580]\  Patient underwent right thoracentesis this admission 2.3 L of fluid was removed Patient states he feels ok. Still reports DOE Blood pressure is still low normal at 106/56 No significant  leg edema   No new labs today  Objective:  Vital signs in last 24 hours:  Temp:  [98.1 F (36.7 C)-98.2 F (36.8 C)] 98.2 F (36.8 C) (10/08 0732) Pulse Rate:  [69-74] 70 (10/08 0732) Resp:  [18] 18 (10/08 0732) BP: (106-114)/(56-67) 106/56 (10/08 0732) SpO2:  [92 %-95 %] 93 % (10/08 0732) Weight:  [115.1 kg] 115.1 kg (10/08 0429)  Weight change: -0.59 kg Filed Weights   09/11/19 0430 09/12/19 0456 09/13/19 0429  Weight: 115.1 kg 115.7 kg 115.1 kg    Intake/Output:    Intake/Output Summary (Last 24 hours) at 09/13/2019 1108 Last data filed at 09/13/2019 1022 Gross per 24 hour  Intake 240 ml  Output 400 ml  Net -160 ml     Physical Exam: General:  No acute distress, standing up  HEENT  anicteric, moist oral mucous membranes  Pulm/lungs  normal breathing effort on room air, clear to auscultation  CVS/Heart  regular rhythm  Abdomen:   Soft, nontender  Extremities:  Trace edema  Neurologic:  Alert, oriented    Basic Metabolic Panel:  Recent Labs  Lab 09/08/19 1952 09/09/19 0647 09/10/19 0522 09/11/19 0448 09/12/19 0540  NA 134* 135 137 133* 135  K 3.7 3.6 3.9 3.9 4.1  CL 100 101 100 98 102  CO2 24 27 27 27 24   GLUCOSE 106* 106* 111* 110* 108*  BUN 27* 26* 24* 23 20  CREATININE 2.06* 1.90* 1.80* 1.68* 1.49*  CALCIUM 8.7* 8.4* 8.8* 8.8* 9.0  MG  --   --  2.3  --  2.3     CBC: Recent Labs  Lab 09/08/19 1952 09/09/19 0647 09/10/19 0522 09/11/19 0448 09/12/19 0540  WBC 6.2 4.8 5.6 6.0 6.9  NEUTROABS 4.9  --   --   --   --   HGB 9.7* 9.0* 9.3* 9.8* 9.3*  HCT 28.9* 27.2* 28.8* 29.9* 27.8*  MCV 94.1 94.1 95.0 94.3 94.6  PLT 118*  100* 117* 117* 100*      Lab Results  Component Value Date   HEPBSAG NON REACTIVE 09/10/2019   HEPBSAB NON REACTIVE 09/10/2019   HEPBIGM NON REACTIVE 09/10/2019      Microbiology:  Recent Results (from the past 240 hour(s))  SARS Coronavirus 2 [facility] order, Performed in [facility] hospital lab) Nasopharyngeal Nasopharyngeal Swab     Status: None   Collection Time: 09/08/19  7:57 PM   Specimen: Nasopharyngeal Swab  Result Value Ref Range Status   SARS Coronavirus 2 NEGATIVE NEGATIVE Final    Comment: (NOTE) If result is NEGATIVE SARS-CoV-2 target nucleic acids are NOT DETECTED. The SARS-CoV-2 RNA is generally detectable in upper and lower  respiratory specimens during the acute phase of infection. The lowest  concentration of SARS-CoV-2 viral copies this assay can detect is 250  copies / mL. A negative result does not preclude SARS-CoV-2 infection  and should not be used as the sole basis for treatment or other  patient management decisions.  A negative result may occur with  improper specimen collection / handling, submission of specimen other  than nasopharyngeal swab, presence of viral mutation(s) within the  areas targeted by this assay, and  inadequate number of viral copies  (<250 copies / mL). A negative result must be combined with clinical  observations, patient history, and epidemiological information. If result is POSITIVE SARS-CoV-2 target nucleic acids are DETECTED. The SARS-CoV-2 RNA is generally detectable in upper and lower  respiratory specimens dur ing the acute phase of infection.  Positive  results are indicative of active infection with SARS-CoV-2.  Clinical  correlation with patient history and other diagnostic information is  necessary to determine patient infection status.  Positive results do  not rule out bacterial infection or co-infection with other viruses. If result is PRESUMPTIVE POSTIVE SARS-CoV-2 nucleic acids MAY BE PRESENT.   A  presumptive positive result was obtained on the submitted specimen  and confirmed on repeat testing.  While 2019 novel coronavirus  (SARS-CoV-2) nucleic acids may be present in the submitted sample  additional confirmatory testing may be necessary for epidemiological  and / or clinical management purposes  to differentiate between  SARS-CoV-2 and other Sarbecovirus currently known to infect humans.  If clinically indicated additional testing with an alternate test  methodology [phone]) is advised. The SARS-CoV-2 RNA is generally  detectable in upper and lower respiratory sp ecimens during the acute  phase of infection. The expected result is Negative. Fact Sheet for Patients:  StrictlyIdeas.no Fact Sheet for Healthcare Providers: BankingDealers.co.za This test is not yet approved or cleared by the [place] FDA and has been authorized for detection and/or diagnosis of SARS-CoV-2 by FDA under an Emergency Use Authorization (EUA).  This EUA will remain in effect (meaning this test can be used) for the duration of the COVID-19 declaration under Section 564(b)(1) of the Act, 21 U.S.C. section 360bbb-3(b)(1), unless the authorization is terminated or revoked sooner. Performed at [facility], [address] [place], [place] [address]     Coagulation Studies: No results for input(s): LABPROT, INR in the last 72 hours.  Urinalysis: No results for input(s): COLORURINE, LABSPEC, PHURINE, GLUCOSEU, HGBUR, BILIRUBINUR, KETONESUR, PROTEINUR, UROBILINOGEN, NITRITE, LEUKOCYTESUR in the last 72 hours.  Invalid input(s): APPERANCEUR    Imaging: Dg Chest 2 View  Result Date: 09/12/2019 CLINICAL DATA:  Shortness of breath, history of right-sided thoracentesis 2 days ago EXAM: CHEST - 2 VIEW COMPARISON:  09/10/2019 FINDINGS: Cardiac shadow is enlarged. Defibrillator is again noted. The lungs are well aerated bilaterally without focal  infiltrate. Small tiny pleural effusions are noted bilaterally. Some associated mild atelectatic changes are noted. No pneumothorax is seen. No bony abnormality is noted. IMPRESSION: Tiny bilateral pleural effusions. Mild bibasilar atelectasis. Electronically Signed   By: Inez Catalina M.D.   On: 09/12/2019 17:50     Medications:       Current Facility-Administered Medications (Cardiovascular):  .  amiodarone (PACERONE) tablet 200 mg .  isosorbide mononitrate (IMDUR) 24 hr tablet 30 mg .  metoprolol succinate (TOPROL-XL) 24 hr tablet 50 mg .  pravastatin (PRAVACHOL) tablet 80 mg .  spironolactone (ALDACTONE) tablet 25 mg .  torsemide (DEMADEX) tablet 20 mg   Current Facility-Administered Medications (Respiratory):  .  fluticasone (FLONASE) 50 MCG/ACT nasal spray 2 spray .  ipratropium-albuterol (DUONEB) 0.5-2.5 (3) MG/3ML nebulizer solution 3 mL .  montelukast (SINGULAIR) tablet 10 mg .  umeclidinium-vilanterol (ANORO ELLIPTA) 62.5-25 MCG/INH 1 puff   Current Facility-Administered Medications (Analgesics):  .  acetaminophen (TYLENOL) tablet 650 mg **OR** acetaminophen (TYLENOL) suppository 650 mg .  allopurinol (ZYLOPRIM) tablet 100 mg .  colchicine tablet 0.6 mg   Current Facility-Administered Medications (Hematological):  Marland Kitchen  apixaban (ELIQUIS) tablet 5 mg .  vitamin B-12 (CYANOCOBALAMIN) tablet 1,000 mcg   Current Facility-Administered Medications (Other):  .  escitalopram (LEXAPRO) tablet 10 mg .  feeding supplement (ENSURE ENLIVE) (ENSURE ENLIVE) liquid 237 mL .  ondansetron (ZOFRAN) tablet 4 mg **OR** ondansetron (ZOFRAN) injection 4 mg .  pantoprazole (PROTONIX) EC tablet 40 mg .  potassium chloride SA (KLOR-CON) CR tablet 20 mEq .  sodium chloride flush (NS) 0.9 % injection 3 mL  No current outpatient medications on file.     Assessment/ Plan:  78 y.o.caucsian male with hypertension, gout, depression, coronary artery disease, COPD, congestive heart failure,  atrial fibrillation, AICD, prostate cancer, GERD, obstructive sleep apnea, thrombocytopenia, who is admitted to [facility] on 09/08/2019 for DOE (dyspnea on exertion) [R06.00] Pleural effusion [J90] Acute on chronic systolic congestive heart failure ([place]) [I50.23] Acute on chronic systolic CHF (congestive heart failure) ([place]) [I50.23]  1. Acute renal failure on chronic kidney disease stage III: baseline creatinine of 1.43, GFR of 47 on 08/17/19.  Chronic kidney disease secondary to hypertension Acute renal failure secondary to acute cardiorenal syndrome - Hold Entresto due to oliguria and low blood pressure.   - continue low-dose torsemide at 20 mg daily  2. acute exacerbation of chronic systolic congestive heart failure. Echocardiogram on 9/16 EF of 30%.  - torsemide - continue spironolactone - low salt diet, fluid restriction 1500 cc/d (approx) - PT evaluation   3. Right pleural effusion - thoracentesis completed on October 5.   - 2.3 L removed   LOS: [place] 10/8/202011:08 AM  [place], [place]  Note: This note was prepared with Dragon dictation. Any transcription errors are unintentional

## 2019-09-13 NOTE — Discharge Summary (Signed)
[place] [facility] [place] NAME: Rick Gilmore    MR#:  [id number]  DATE OF BIRTH:  05/24/1941  DATE OF ADMISSION:  09/08/2019 ADMITTING PHYSICIAN: Lance Coon, MD  DATE OF DISCHARGE: 09/13/2019   PRIMARY CARE PHYSICIAN: Crecencio Mc, MD    ADMISSION DIAGNOSIS:  DOE (dyspnea on exertion) [R06.00] Pleural effusion [J90] Acute on chronic systolic congestive heart failure (HCC) [I50.23] Acute on chronic systolic CHF (congestive heart failure) (HCC) [I50.23]  DISCHARGE DIAGNOSIS:  Principal Problem:   Acute on chronic systolic CHF (congestive heart failure) (HCC) Active Problems:   Sleep apnea, obstructive   COPD (chronic obstructive pulmonary disease) (HCC)   Atrial fibrillation (HCC)   Chronic renal insufficiency   Cirrhosis, 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 AICD (automatic cardioverter/defibrillator) present   . Anemia   . Arrhythmia    a fib  . Atrial fibrillation ([place])    on Coumadin  . CAD (coronary artery disease)   . Cancer [facility]) 2001   Prostate, XRT and implant  . CHF (congestive heart failure) ([place])   . COPD (chronic obstructive pulmonary disease) ([place])   . Erectile dysfunction   . GERD (gastroesophageal reflux disease)   . Hyperlipidemia   . Hypertension   . Ischemic cardiomyopathy    s/p AICD/pacer 2009, replaced 2010 [facility]  . MI (myocardial infarction) ([place]) 1988  . Personal history of prostate cancer 2001   s/p XRT and implant ([place])  . Prostate cancer ([place])   . Sleep apnea, obstructive    uses CPAP nightly  . Thrombocytopenia ([place])   . Tubular adenoma    colon polyp  . Vitamin B 12 deficiency     HOSPITAL COURSE:   Acute on chronic systolic CHF (congestive heart failure) ejection fraction 30%. He was treated with IV Lasix and changed to torsemide.  Hold torsemide and decreased IV Lasix 40 mg twice daily per Dr. Clayborn Bigness.   Held torsemide  initially, now resume at lower dose per Dr. Candiss Norse. continue spironolactone, decrease dose of Toprol and hold Entresto-due to borderline blood pressure.  Advised to follow-up with cardiologist in 1 to 2 weeks.  Pleural effusion. Interval small to moderate-sized right pleural effusion. S/p arthrocentesis with 2.3 L fluid withdrawal.  Resume Eliquis. Repeat x-ray now again because of worsening dyspnea on exertion. Chest x-ray shows small pleural effusion.  On exertion patient did not had any drop in oxygenation with physical therapy.  I advised him to continue doing physical therapy and keep using incentive spirometry at home.  He understands and agrees with the plan.  Also advised to follow-up with pulmonologist and cardiologist clinic.  COPD (chronic obstructive pulmonary disease) ([place]) -continue home meds     Atrial fibrillation ([place]) -continue amiodarone and Toprol, Resume Eliquis.  Sleep apnea, obstructive -BiPAP nightly CKD stage III-at baseline, stable.  Avoid nephrotoxins and follow-up BMP. 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 Anemia of chronic disease.  Stable.   DISCHARGE CONDITIONS:   Stable  CONSULTS OBTAINED:    DRUG ALLERGIES:  No Known Allergies  DISCHARGE MEDICATIONS:   Allergies as of 09/13/2019   No Known Allergies     Medication List    STOP taking these medications   doxazosin 4 MG tablet Commonly known as: CARDURA   Entresto 24-26 MG Generic drug: sacubitril-valsartan   metolazone 2.5 MG tablet Commonly known as: ZAROXOLYN     TAKE these medications   albuterol  108 (90 Base) MCG/ACT inhaler Commonly known as: VENTOLIN HFA Inhale 2 puffs into the lungs every 6 (six) hours as needed for wheezing or shortness of breath.   allopurinol 100 MG tablet Commonly known as: ZYLOPRIM Take 1 tablet (100 mg total) by mouth daily.   amiodarone 200 MG tablet Commonly known as: Pacerone Take 1 tablet (200 mg total) by mouth  daily. What changed: Another medication with the same name was removed. Continue taking this medication, and follow the directions you see here.   Anoro Ellipta 62.5-25 MCG/INH Aepb Generic drug: umeclidinium-vilanterol USE 1 INHALATION DAILY What changed: See the new instructions.   colchicine 0.6 MG tablet Take 0.6 mg by mouth daily.   Cyanocobalamin 1000 MCG Subl Place 1 tablet (1,000 mcg total) under the tongue every morning.   Eliquis 5 MG Tabs tablet Generic drug: apixaban Take 5 mg by mouth 2 (two) times daily.   escitalopram 10 MG tablet Commonly known as: LEXAPRO Take 1 tablet (10 mg total) by mouth daily.   fluticasone 50 MCG/ACT nasal spray Commonly known as: FLONASE USE 1 SPRAY IN EACH NOSTRIL DAILY What changed: how much to take   isosorbide mononitrate 30 MG 24 hr tablet Commonly known as: IMDUR Take 30 mg by mouth daily.   metoprolol succinate 50 MG 24 hr tablet Commonly known as: TOPROL-XL Take 1 tablet (50 mg total) by mouth daily. Take with or immediately following a meal. Start taking on: September 14, 2019 What changed:   medication strength  how much to take  additional instructions  Another medication with the same name was removed. Continue taking this medication, and follow the directions you see here.   montelukast 10 MG tablet Commonly known as: SINGULAIR Take 1 tablet (10 mg total) by mouth at bedtime.   pantoprazole 40 MG tablet Commonly known as: PROTONIX Take 1 tablet (40 mg total) by mouth daily.   potassium chloride SA 20 MEQ tablet Commonly known as: KLOR-CON Take 1 tablet (20 mEq total) by mouth daily.   pravastatin 80 MG tablet Commonly known as: PRAVACHOL Take 80 mg by mouth daily.   spironolactone 25 MG tablet Commonly known as: ALDACTONE Take 25 mg by mouth daily.   torsemide 20 MG tablet Commonly known as: DEMADEX Take 1 tablet (20 mg total) by mouth daily. Start taking on: September 14, 2019 What changed: See the  new instructions.        DISCHARGE INSTRUCTIONS:    Follow with cardiologist and PMD in 1 to 2 weeks.   If you experience worsening of your admission symptoms, develop shortness of breath, life threatening emergency, suicidal or homicidal thoughts you must seek medical attention immediately by calling 911 or calling your MD immediately  if symptoms less severe.  You Must read complete instructions/literature along with all the possible adverse reactions/side effects for all the Medicines you take and that have been prescribed to you. Take any new Medicines after you have completely understood and accept all the possible adverse reactions/side effects.   Please note  You were cared for by a hospitalist during your hospital stay. If you have any questions about your discharge medications or the care you received while you were in the hospital after you are discharged, you can call the unit and asked to speak with the hospitalist on call if the hospitalist that took care of you is not available. Once you are discharged, your primary care physician will handle any further medical issues. Please note that NO  REFILLS for any discharge medications will be authorized once you are discharged, as it is imperative that you return to your primary care physician (or establish a relationship with a primary care physician if you do not have one) for your aftercare needs so that they can reassess your need for medications and monitor your lab values.    Today   CHIEF COMPLAINT:   Chief Complaint  Patient presents with  . Shortness of Breath    HISTORY OF PRESENT ILLNESS:  Rick Gilmore  is a 78 y.o. male presents with chief complaint as above.  Patient presents the ED with 2 days worsening shortness of breath and increased lower extremity edema.  He has known condition of heart failure with an EF of around 30%.  He has had prior AICD placement.  He has significant cardiac history including remote  cardiac bypass.  Cardiology saw him in the ED tonight and recommends admission for further diuresis.  Hospitalist called for the same.   VITAL SIGNS:  Blood pressure (!) 106/56, pulse 70, temperature 98.2 F (36.8 C), temperature source Oral, resp. rate 18, height 6\' 2"  (1.88 m), weight 115.1 kg, SpO2 93 %.  I/O:    Intake/Output Summary (Last 24 hours) at 09/13/2019 1334 Last data filed at 09/13/2019 1022 Gross per 24 hour  Intake 240 ml  Output 400 ml  Net -160 ml    PHYSICAL EXAMINATION:   Constitutional:      General: He is not in acute distress.    Appearance: Normal appearance.  HENT:     Head: Normocephalic.     Mouth/Throat:     Mouth: Mucous membranes are moist.  Eyes:     General: No scleral icterus.    Conjunctiva/sclera: Conjunctivae normal.     Pupils: Pupils are equal, round, and reactive to light.  Neck:     Musculoskeletal: Normal range of motion and neck supple.     Vascular: No JVD.     Trachea: No tracheal deviation.  Cardiovascular:     Rate and Rhythm: Normal rate and regular rhythm.     Heart sounds: Normal heart sounds. No murmur. No gallop.   Pulmonary:     Effort: Pulmonary effort is normal. No respiratory distress.     Breath sounds: Rales present. No wheezing.  Abdominal:     General: Bowel sounds are normal. There is no distension.     Palpations: Abdomen is soft.     Tenderness: There is no abdominal tenderness. There is no rebound.  Musculoskeletal: Normal range of motion.        General: No tenderness.     Right lower leg: No edema.     Left lower leg: No edema.  Skin:    Findings: No erythema or rash.  Neurological:     General: No focal deficit present.     Mental Status: He is alert and oriented to person, place, and time.     Cranial Nerves: No cranial nerve deficit.  Psychiatric:        Mood and Affect: Mood normal.   DATA REVIEW:   CBC Recent Labs  Lab 09/12/19 0540  WBC 6.9  HGB 9.3*  HCT 27.8*  PLT 100*     Chemistries  Recent Labs  Lab 09/12/19 0540  NA 135  K 4.1  CL 102  CO2 24  GLUCOSE 108*  BUN 20  CREATININE 1.49*  CALCIUM 9.0  MG 2.3    Cardiac Enzymes No results for input(s):  TROPONINI in the last 168 hours.  Microbiology Results  Results for orders placed or performed during the hospital encounter of 09/08/19  SARS Coronavirus 2 [facility] order, Performed in [facility] hospital lab) Nasopharyngeal Nasopharyngeal Swab     Status: None   Collection Time: 09/08/19  7:57 PM   Specimen: Nasopharyngeal Swab  Result Value Ref Range Status   SARS Coronavirus 2 NEGATIVE NEGATIVE Final    Comment: (NOTE) If result is NEGATIVE SARS-CoV-2 target nucleic acids are NOT DETECTED. The SARS-CoV-2 RNA is generally detectable in upper and lower  respiratory specimens during the acute phase of infection. The lowest  concentration of SARS-CoV-2 viral copies this assay can detect is 250  copies / mL. A negative result does not preclude SARS-CoV-2 infection  and should not be used as the sole basis for treatment or other  patient management decisions.  A negative result may occur with  improper specimen collection / handling, submission of specimen other  than nasopharyngeal swab, presence of viral mutation(s) within the  areas targeted by this assay, and inadequate number of viral copies  (<250 copies / mL). A negative result must be combined with clinical  observations, patient history, and epidemiological information. If result is POSITIVE SARS-CoV-2 target nucleic acids are DETECTED. The SARS-CoV-2 RNA is generally detectable in upper and lower  respiratory specimens dur ing the acute phase of infection.  Positive  results are indicative of active infection with SARS-CoV-2.  Clinical  correlation with patient history and other diagnostic information is  necessary to determine patient infection status.  Positive results do  not rule out bacterial infection or co-infection with  other viruses. If result is PRESUMPTIVE POSTIVE SARS-CoV-2 nucleic acids MAY BE PRESENT.   A presumptive positive result was obtained on the submitted specimen  and confirmed on repeat testing.  While 2019 novel coronavirus  (SARS-CoV-2) nucleic acids may be present in the submitted sample  additional confirmatory testing may be necessary for epidemiological  and / or clinical management purposes  to differentiate between  SARS-CoV-2 and other Sarbecovirus currently known to infect humans.  If clinically indicated additional testing with an alternate test  methodology [phone]) is advised. The SARS-CoV-2 RNA is generally  detectable in upper and lower respiratory sp ecimens during the acute  phase of infection. The expected result is Negative. Fact Sheet for Patients:  StrictlyIdeas.no Fact Sheet for Healthcare Providers: BankingDealers.co.za This test is not yet approved or cleared by the [place] FDA and has been authorized for detection and/or diagnosis of SARS-CoV-2 by FDA under an Emergency Use Authorization (EUA).  This EUA will remain in effect (meaning this test can be used) for the duration of the COVID-19 declaration under Section 564(b)(1) of the Act, 21 U.S.C. section 360bbb-3(b)(1), unless the authorization is terminated or revoked sooner. Performed at [facility], [address] [place], [place] [address]     RADIOLOGY:  Dg Chest 2 View  Result Date: 09/12/2019 CLINICAL DATA:  Shortness of breath, history of right-sided thoracentesis 2 days ago EXAM: CHEST - 2 VIEW COMPARISON:  09/10/2019 FINDINGS: Cardiac shadow is enlarged. Defibrillator is again noted. The lungs are well aerated bilaterally without focal infiltrate. Small tiny pleural effusions are noted bilaterally. Some associated mild atelectatic changes are noted. No pneumothorax is seen. No bony abnormality is noted. IMPRESSION: Tiny bilateral  pleural effusions. Mild bibasilar atelectasis. Electronically Signed   By: Inez Catalina M.D.   On: 09/12/2019 17:50    EKG:   Orders placed  or performed during the hospital encounter of 09/08/19  . EKG 12-Lead  . EKG 12-Lead  . ED EKG  . ED EKG      Management plans discussed with the patient, family and they are in agreement.  CODE STATUS:     Code Status Orders  (From admission, onward)         Start     Ordered   09/08/19 2238  Full code  Continuous     09/08/19 2237        Code Status History    Date Active Date Inactive Code Status Order ID Comments User Context   08/10/2019 0054 08/14/2019 1845 Full Code [id number]  Lance Coon, MD Inpatient   01/16/2016 1544 01/19/2016 1726 Full Code [id number]  Theodoro Grist, MD Inpatient   Advance Care Planning Activity    Advance Directive Documentation     Most Recent Value  Type of Advance Directive  Healthcare Power of Trail, Living will  Pre-existing out of facility DNR order (yellow form or pink MOST form)  -  "MOST" Form in Place?  -      TOTAL TIME TAKING CARE OF THIS PATIENT: 35 minutes.    Vaughan Basta M.D on 09/13/2019 at 1:34 PM  Between 7am to 6pm - Pager - [phone]  After 6pm go to [email or web address] - password [email or web address] [place] [facility]  Office  [phone]  CC: Primary care physician; Crecencio Mc, MD   Note: This dictation was prepared with Dragon dictation along with smaller phrase technology. Any transcriptional errors that result from this process are unintentional.

## 2019-09-13 NOTE — Progress Notes (Signed)
IV and tele removed from patient. Discharge instructions given to patient and daughter. Verbalized understanding. No acute distress at this time. Daughter is at bedside and will transport home.

## 2019-09-14 ENCOUNTER — Telehealth: Payer: Self-pay | Admitting: Internal Medicine

## 2019-09-14 ENCOUNTER — Telehealth: Payer: Self-pay

## 2019-09-14 ENCOUNTER — Other Ambulatory Visit: Payer: Self-pay

## 2019-09-14 DIAGNOSIS — I130 Hypertensive heart and chronic kidney disease with heart failure and stage 1 through stage 4 chronic kidney disease, or unspecified chronic kidney disease: Secondary | ICD-10-CM | POA: Diagnosis not present

## 2019-09-14 DIAGNOSIS — Z951 Presence of aortocoronary bypass graft: Secondary | ICD-10-CM | POA: Diagnosis not present

## 2019-09-14 DIAGNOSIS — I255 Ischemic cardiomyopathy: Secondary | ICD-10-CM | POA: Diagnosis not present

## 2019-09-14 DIAGNOSIS — D638 Anemia in other chronic diseases classified elsewhere: Secondary | ICD-10-CM | POA: Diagnosis not present

## 2019-09-14 DIAGNOSIS — I5023 Acute on chronic systolic (congestive) heart failure: Secondary | ICD-10-CM | POA: Diagnosis not present

## 2019-09-14 DIAGNOSIS — D696 Thrombocytopenia, unspecified: Secondary | ICD-10-CM | POA: Diagnosis not present

## 2019-09-14 DIAGNOSIS — C61 Malignant neoplasm of prostate: Secondary | ICD-10-CM | POA: Diagnosis not present

## 2019-09-14 DIAGNOSIS — Z79899 Other long term (current) drug therapy: Secondary | ICD-10-CM | POA: Diagnosis not present

## 2019-09-14 DIAGNOSIS — N183 Chronic kidney disease, stage 3 unspecified: Secondary | ICD-10-CM | POA: Diagnosis not present

## 2019-09-14 DIAGNOSIS — I2510 Atherosclerotic heart disease of native coronary artery without angina pectoris: Secondary | ICD-10-CM | POA: Diagnosis not present

## 2019-09-14 DIAGNOSIS — J449 Chronic obstructive pulmonary disease, unspecified: Secondary | ICD-10-CM | POA: Diagnosis not present

## 2019-09-14 DIAGNOSIS — Z95810 Presence of automatic (implantable) cardiac defibrillator: Secondary | ICD-10-CM | POA: Diagnosis not present

## 2019-09-14 DIAGNOSIS — K7469 Other cirrhosis of liver: Secondary | ICD-10-CM | POA: Diagnosis not present

## 2019-09-14 DIAGNOSIS — E538 Deficiency of other specified B group vitamins: Secondary | ICD-10-CM | POA: Diagnosis not present

## 2019-09-14 DIAGNOSIS — Z950 Presence of cardiac pacemaker: Secondary | ICD-10-CM | POA: Diagnosis not present

## 2019-09-14 DIAGNOSIS — K219 Gastro-esophageal reflux disease without esophagitis: Secondary | ICD-10-CM | POA: Diagnosis not present

## 2019-09-14 DIAGNOSIS — Z7901 Long term (current) use of anticoagulants: Secondary | ICD-10-CM | POA: Diagnosis not present

## 2019-09-14 DIAGNOSIS — G4733 Obstructive sleep apnea (adult) (pediatric): Secondary | ICD-10-CM | POA: Diagnosis not present

## 2019-09-14 NOTE — Telephone Encounter (Signed)
Home Health Verbal Orders - Caller/Agency: [facility] [place] Number: [phone] Requesting OT/PT/Skilled Nursing/Social Work/Speech Therapy: Skilled Nursing + home health aid  Frequency: 1w1  2w1  1w4 1 every other week for 3 weeks 3 as needed  Home Health aid  2 w 5

## 2019-09-14 NOTE — Telephone Encounter (Signed)
Called and spoke to [place] w/ [place] and gave verbal orders for skilled nursing and home health aid as requested.

## 2019-09-14 NOTE — Telephone Encounter (Signed)
Transition Care Management Follow-up Telephone Call   Date discharged?   How have you been since you were released from the hospital? Patient states, "I am doing so much better. I feel good. I am using the spirometer 10 times a day and I plan to continue. I want to regain all of my strength back. My bladder and bowel are doing good." Denies pain SOB, chest pain and all other symptoms.    Do you understand why you were in the hospital? Yes   Do you understand the discharge instructions? Yes   Where were you discharged to? Home.   Items Reviewed:  Medications reviewed: Yes, daughter is now managing medications. No questions or concerns. Taking as scheduled.   Allergies reviewed: Yes, none new.  Dietary changes reviewed: Yes, low sodium/low carb.  Referrals reviewed: Yes, HH in progress for 5 weeks. Cardiology. Nephrology.    Functional Questionnaire:   Activities of Daily Living (ADLs):   He states they are independent in the following: Independent in all ADLs.  States they require assistance with the following: He does not require assistance with ADLs at this time. Ambulates with cane as needed.    Any transportation issues/concerns?: None.   Any patient concerns? Requests cardiac rehab after [facility] is completed.    Confirmed importance and date/time of follow-up visits scheduled Yes, [facility] virtual appointment scheduled 09/17/19 at 11:00. [phone].  Provider Appointment booked with Dr. Derrel Nip, pcp.  Confirmed with patient if condition begins to worsen call PCP or go to the ER.  Patient was given the office number and encouraged to call back with question or concerns.  : Yes

## 2019-09-14 NOTE — Telephone Encounter (Signed)
Patient daughter called in to get more information on the medicine that her Father is taking called Cyanocobalamin 1000 MCG SUBL    She would like for someone to call her back   Call back [phone] Please advise

## 2019-09-17 ENCOUNTER — Other Ambulatory Visit: Payer: Self-pay

## 2019-09-17 ENCOUNTER — Ambulatory Visit (INDEPENDENT_AMBULATORY_CARE_PROVIDER_SITE_OTHER): Payer: Medicare Other | Admitting: Internal Medicine

## 2019-09-17 ENCOUNTER — Telehealth: Payer: Self-pay

## 2019-09-17 ENCOUNTER — Encounter: Payer: Self-pay | Admitting: Internal Medicine

## 2019-09-17 VITALS — Ht 74.0 in | Wt 247.4 lb

## 2019-09-17 DIAGNOSIS — I472 Ventricular tachycardia, unspecified: Secondary | ICD-10-CM

## 2019-09-17 DIAGNOSIS — N183 Chronic kidney disease, stage 3 unspecified: Secondary | ICD-10-CM | POA: Diagnosis not present

## 2019-09-17 DIAGNOSIS — I509 Heart failure, unspecified: Secondary | ICD-10-CM

## 2019-09-17 DIAGNOSIS — I4821 Permanent atrial fibrillation: Secondary | ICD-10-CM

## 2019-09-17 DIAGNOSIS — I255 Ischemic cardiomyopathy: Secondary | ICD-10-CM | POA: Diagnosis not present

## 2019-09-17 DIAGNOSIS — I130 Hypertensive heart and chronic kidney disease with heart failure and stage 1 through stage 4 chronic kidney disease, or unspecified chronic kidney disease: Secondary | ICD-10-CM | POA: Diagnosis not present

## 2019-09-17 DIAGNOSIS — I2510 Atherosclerotic heart disease of native coronary artery without angina pectoris: Secondary | ICD-10-CM | POA: Diagnosis not present

## 2019-09-17 DIAGNOSIS — G4733 Obstructive sleep apnea (adult) (pediatric): Secondary | ICD-10-CM | POA: Diagnosis not present

## 2019-09-17 DIAGNOSIS — N1831 Chronic kidney disease, stage 3a: Secondary | ICD-10-CM | POA: Diagnosis not present

## 2019-09-17 DIAGNOSIS — Z09 Encounter for follow-up examination after completed treatment for conditions other than malignant neoplasm: Secondary | ICD-10-CM

## 2019-09-17 DIAGNOSIS — I5023 Acute on chronic systolic (congestive) heart failure: Secondary | ICD-10-CM | POA: Diagnosis not present

## 2019-09-17 NOTE — Assessment & Plan Note (Signed)
Reducing torsemide dose to 20 mg daily per discharge instructions.  Advised patient to continue to monitor wt daily and double the torsemide dose for overnight weight gain of 2 lbs or weekly weight gain of 5 lbs.

## 2019-09-17 NOTE — Progress Notes (Signed)
Virtual Visit via [email or web address]  This visit type was conducted due to national recommendations for restrictions regarding the COVID-19 pandemic (e.g. social distancing).  This format is felt to be most appropriate for this patient at this time.  All issues noted in this document were discussed and addressed.  No physical exam was performed (except for noted visual exam findings with Video Visits).   I connected with@ on 09/17/19 at 11:00 AM EDT by a video enabled telemedicine application  and verified that I am speaking with the correct person using two identifiers. Location patient: home Location provider: work or home office Persons participating in the virtual visit: patient, provider, and patient's daughter   I discussed the limitations, risks, security and privacy concerns of performing an evaluation and management service by telephone and the availability of in person appointments. I also discussed with the patient that there may be a patient responsible charge related to this service. The patient expressed understanding and agreed to proceed.  Reason for visit: hospital follow up  HPI:   78 yr old male with history of ischemic cardiomyopathy with chronic systolic CHF S/p AICD, chronic atrial fibrillation  Cirrhosis   Admitted to [facility] on sept 16 and dc'd on sept 24 with sustained VT underwent successful ablation sept 21. Discharged  with home health   Amiodarone 400 mg daily  Lower dose of toprol to 50 ,  And entresto held,  Valsartan 4 0 mg started .  EF 30% unchanged.  At follow up on sept 29 with [facility] plans to reduce amiodarone following week to 200 mg daily were made.   Readmitted , to [facility]  on Oct 3  With dyspnea..   Underwent thoracentesis    2.3 L  Right lung and diuretic dose greately reduce at discharge but not followed. Marland Kitchen He is still taking 60 mg torsemide in the am and 40 in the pm.  Still dyspneic with exertion but not at rest or with supine postion.  Dose not have  bp machine or  pulse ox.  Weight has decreased by 19 lbs since first admission.   ROS: See pertinent positives and negatives per HPI.  Past Medical History:  Diagnosis Date  . AICD (automatic cardioverter/defibrillator) present   . Anemia   . Arrhythmia    a fib  . Atrial fibrillation ([place])    on Coumadin  . CAD (coronary artery disease)   . Cancer [facility]) 2001   Prostate, XRT and implant  . CHF (congestive heart failure) ([place])   . COPD (chronic obstructive pulmonary disease) ([place])   . Erectile dysfunction   . GERD (gastroesophageal reflux disease)   . Hyperlipidemia   . Hypertension   . Ischemic cardiomyopathy    s/p AICD/pacer 2009, replaced 2010 [facility]  . MI (myocardial infarction) ([place]) 1988  . Personal history of prostate cancer 2001   s/p XRT and implant ([place])  . Prostate cancer ([place])   . Sleep apnea, obstructive    uses CPAP nightly  . Thrombocytopenia ([place])   . Tubular adenoma    colon polyp  . Vitamin B 12 deficiency     Past Surgical History:  Procedure Laterality Date  . CARDIAC CATHETERIZATION    . COLONOSCOPY WITH PROPOFOL [name] 05/30/2015   Procedure: COLONOSCOPY WITH PROPOFOL;  Surgeon: Manya Silvas, MD;  Location[facility] ENDOSCOPY;  Service: Endoscopy;  Laterality: [name];  . COLONOSCOPY WITH PROPOFOL [name] 07/16/2019   Procedure: COLONOSCOPY WITH PROPOFOL;  Surgeon: Alice Reichert, Benay Pike, MD;  Location:  [facility] ENDOSCOPY;  Service: Gastroenterology;  Laterality: [name];  . CORONARY ARTERY BYPASS GRAFT  1988  . ESOPHAGOGASTRODUODENOSCOPY [name] 05/30/2015   Procedure: ESOPHAGOGASTRODUODENOSCOPY (EGD);  Surgeon: Manya Silvas, MD;  Location: [facility] ENDOSCOPY;  Service: Endoscopy;  Laterality: [name];  . ESOPHAGOGASTRODUODENOSCOPY (EGD) WITH PROPOFOL [name] 07/16/2019   Procedure: ESOPHAGOGASTRODUODENOSCOPY (EGD) WITH PROPOFOL;  Surgeon: Toledo, Benay Pike, MD;  Location: [facility] ENDOSCOPY;  Service: Gastroenterology;  Laterality: [name];  . IMPLANTABLE CARDIOVERTER DEFIBRILLATOR GENERATOR CHANGE  April  2014   Bi V RRR  . INSERT / REPLACE / REMOVE PACEMAKER  2014  . VASECTOMY      Family History  Problem Relation Age of Onset  . Hypertension Mother   . Hypertension Father   . Cancer Father        prostate, throat  . Emphysema Father   . Heart disease Maternal Grandmother   . Heart disease Paternal Grandmother     SOCIAL HX:  reports that he has never smoked. He has never used smokeless tobacco. He reports current alcohol use of about 4.0 standard drinks of alcohol per week. He reports that he does not use drugs.   Current Outpatient Medications:  .  albuterol (PROVENTIL HFA;VENTOLIN HFA) 108 (90 Base) MCG/ACT inhaler, Inhale 2 puffs into the lungs every 6 (six) hours as needed for wheezing or shortness of breath., Disp: 1 Inhaler, Rfl: 1 .  allopurinol (ZYLOPRIM) 100 MG tablet, Take 1 tablet (100 mg total) by mouth daily., Disp: 90 tablet, Rfl: 3 .  amiodarone (PACERONE) 200 MG tablet, Take 1 tablet (200 mg total) by mouth daily., Disp: 30 tablet, Rfl: 0 .  ANORO ELLIPTA 62.5-25 MCG/INH AEPB, USE 1 INHALATION DAILY (Patient taking differently: Inhale 1 puff into the lungs daily. ), Disp: 60 each, Rfl: 0 .  apixaban (ELIQUIS) 5 MG TABS tablet, Take 5 mg by mouth 2 (two) times daily. , Disp: , Rfl:  .  colchicine 0.6 MG tablet, Take 0.6 mg by mouth daily., Disp: , Rfl:  .  Cyanocobalamin 1000 MCG SUBL, Place 1 tablet (1,000 mcg total) under the tongue every morning., Disp: 90 tablet, Rfl: 3 .  escitalopram (LEXAPRO) 10 MG tablet, Take 1 tablet (10 mg total) by mouth daily., Disp: 90 tablet, Rfl: 3 .  fluticasone (FLONASE) 50 MCG/ACT nasal spray, USE 1 SPRAY IN EACH NOSTRIL DAILY (Patient taking differently: Place 2 sprays into both nostrils daily. ), Disp: 48 g, Rfl: 3 .  isosorbide mononitrate (IMDUR) 30 MG 24 hr tablet, Take 30 mg by mouth daily., Disp: , Rfl: 11 .  metoprolol succinate (TOPROL-XL) 50 MG 24 hr tablet, Take 1 tablet (50 mg total) by mouth daily. Take with or  immediately following a meal., Disp: 30 tablet, Rfl: 0 .  montelukast (SINGULAIR) 10 MG tablet, Take 1 tablet (10 mg total) by mouth at bedtime., Disp: 90 tablet, Rfl: 3 .  pantoprazole (PROTONIX) 40 MG tablet, Take 1 tablet (40 mg total) by mouth daily., Disp: 90 tablet, Rfl: 3 .  potassium chloride SA (K-DUR) 20 MEQ tablet, Take 1 tablet (20 mEq total) by mouth daily., Disp: 90 tablet, Rfl: 3 .  pravastatin (PRAVACHOL) 80 MG tablet, Take 80 mg by mouth daily., Disp: , Rfl:  .  spironolactone (ALDACTONE) 25 MG tablet, Take 25 mg by mouth daily., Disp: , Rfl:  .  torsemide (DEMADEX) 20 MG tablet, Take 1 tablet (20 mg total) by mouth daily., Disp: 30 tablet, Rfl: 0  EXAM:  VITALS per patient if applicable:  GENERAL: alert, oriented, appears well and in no acute distress  HEENT: atraumatic, conjunttiva clear, no obvious abnormalities on inspection of external nose and ears  NECK: normal movements of the head and neck  LUNGS: on inspection no signs of respiratory distress, breathing rate appears normal, no obvious gross SOB, gasping or wheezing  CV: no obvious cyanosis  MS: moves all visible extremities without noticeable abnormality  PSYCH/NEURO: pleasant and cooperative, no obvious depression or anxiety, speech and thought processing grossly intact  ASSESSMENT AND PLAN:  Discussed the following assessment and plan:  Chronic renal impairment, stage 3a - Plan: Basic metabolic panel  Permanent atrial fibrillation (HCC)  V-tach (HCC)  Pleural effusion due to CHF (congestive heart failure) [facility])  Hospital discharge follow-up  Atrial fibrillation (HCC) Rate controlled on metoprolol 50 mg daily   V-tach [facility]) S/p defibrillator discharge , on amiodarone  200 mg daily   Pleural effusion due to CHF (congestive heart failure) ([place]) S/p removal of 2.3 L  During October 2020 [facility] admission   Chronic renal insufficiency Reducing torsemide dose to 20 mg daily per discharge  instructions.  Advised patient to continue to monitor wt daily and double the torsemide dose for overnight weight gain of 2 lbs or weekly weight gain of 5 lbs.   Hospital discharge follow-up Patient has had 3 admissions in the last 60 days.  The most recent occurred last week and resolved a large pleural effusion removed by thoracentesis .  Patient is stable post discharge and has been taking an excessive dose of torsemide,  which was corrected.  All labs , imaging studies and progress notes from admission were reviewed with patient today      I discussed the assessment and treatment plan with the patient. The patient was provided an opportunity to ask questions and all were answered. The patient agreed with the plan and demonstrated an understanding of the instructions.   The patient was advised to call back or seek an in-person evaluation if the symptoms worsen or if the condition fails to improve as anticipated.   Crecencio Mc, MD

## 2019-09-17 NOTE — Telephone Encounter (Signed)
Pt and daughter had a virtual visit with Dr. Derrel Nip this morning.

## 2019-09-17 NOTE — Assessment & Plan Note (Signed)
S/p removal of 2.3 L  During October 2020 [facility] admission

## 2019-09-17 NOTE — Assessment & Plan Note (Signed)
Rate controlled on metoprolol 50 mg daily

## 2019-09-17 NOTE — Assessment & Plan Note (Signed)
S/p defibrillator discharge , on amiodarone  200 mg daily

## 2019-09-17 NOTE — Assessment & Plan Note (Signed)
Patient has had 3 admissions in the last 60 days.  The most recent occurred last week and resolved a large pleural effusion removed by thoracentesis .  Patient is stable post discharge and has been taking an excessive dose of torsemide,  which was corrected.  All labs , imaging studies and progress notes from admission were reviewed with patient today

## 2019-09-17 NOTE — Telephone Encounter (Signed)
Copied from [place] [id number]. Topic: Quick Communication - Home Health Verbal Orders >> Sep 17, 2019  1:30 PM Ivar Drape wrote: Caller/Agency: Elder Cyphers PT w/[facility] Number:  [phone] Requesting OT/PT/Skilled Nursing/Social Work/Speech Therapy: PT  Frequency: 2w4

## 2019-09-18 NOTE — Telephone Encounter (Signed)
LMTCB to give verbal orders.

## 2019-09-19 ENCOUNTER — Ambulatory Visit (INDEPENDENT_AMBULATORY_CARE_PROVIDER_SITE_OTHER): Payer: Medicare Other | Admitting: *Deleted

## 2019-09-19 ENCOUNTER — Other Ambulatory Visit: Payer: Self-pay

## 2019-09-19 DIAGNOSIS — I4430 Unspecified atrioventricular block: Secondary | ICD-10-CM | POA: Diagnosis not present

## 2019-09-19 DIAGNOSIS — I472 Ventricular tachycardia: Secondary | ICD-10-CM | POA: Diagnosis not present

## 2019-09-19 DIAGNOSIS — N1831 Chronic kidney disease, stage 3a: Secondary | ICD-10-CM | POA: Diagnosis not present

## 2019-09-19 DIAGNOSIS — I480 Paroxysmal atrial fibrillation: Secondary | ICD-10-CM | POA: Diagnosis not present

## 2019-09-19 DIAGNOSIS — Z23 Encounter for immunization: Secondary | ICD-10-CM | POA: Diagnosis not present

## 2019-09-19 DIAGNOSIS — I2510 Atherosclerotic heart disease of native coronary artery without angina pectoris: Secondary | ICD-10-CM | POA: Diagnosis not present

## 2019-09-19 LAB — BASIC METABOLIC PANEL
BUN: 35 mg/dL — ABNORMAL HIGH (ref 6–23)
CO2: 25 mEq/L (ref 19–32)
Calcium: 9.5 mg/dL (ref 8.4–10.5)
Chloride: 99 mEq/L (ref 96–112)
Creatinine, Ser: 2.23 mg/dL — ABNORMAL HIGH (ref 0.40–1.50)
GFR: 28.64 mL/min — ABNORMAL LOW (ref >60.00)
Glucose, Bld: 103 mg/dL — ABNORMAL HIGH (ref 70–99)
Potassium: 4.6 mEq/L (ref 3.5–5.1)
Sodium: 135 mEq/L (ref 135–145)

## 2019-09-19 NOTE — Telephone Encounter (Signed)
Rick Gilmore returned the office call. Advised per note below.

## 2019-09-20 ENCOUNTER — Other Ambulatory Visit: Payer: Self-pay | Admitting: *Deleted

## 2019-09-20 NOTE — Patient Outreach (Signed)
[facility]) Care Management  09/20/2019  Rick Gilmore 04/09/1941 [id number]   Initial Referral Received-10/9  Telephone Assessment- Unsuccessful  RN attempted outreach call however unsuccessful. RN able to leave a HIPAA approved voice message requesting a call back. Will further engage and completed EMMIs as noted below.   EMMI-GENERAL DISCHARGE RED ON EMMI ALERT Day #1 Date:09/15/19 Red Alert Reason: D/C PAPERWORK (NO)  EMMI-GENERAL DISCHARGE RED ON EMMI ALERT Day #4 Date:09/18/2019 Red Alert Reason: SAD/HOPELESS/EMPTY  Will attempt another outreach call within 4 business days and send outreach letter based upon the initial referral for pending services.  Raina Mina, RN Care Management Coordinator [place] Office [phone]

## 2019-09-21 ENCOUNTER — Ambulatory Visit: Payer: Self-pay | Admitting: *Deleted

## 2019-09-21 DIAGNOSIS — I2510 Atherosclerotic heart disease of native coronary artery without angina pectoris: Secondary | ICD-10-CM | POA: Diagnosis not present

## 2019-09-21 DIAGNOSIS — G4733 Obstructive sleep apnea (adult) (pediatric): Secondary | ICD-10-CM | POA: Diagnosis not present

## 2019-09-21 DIAGNOSIS — I255 Ischemic cardiomyopathy: Secondary | ICD-10-CM | POA: Diagnosis not present

## 2019-09-21 DIAGNOSIS — N183 Chronic kidney disease, stage 3 unspecified: Secondary | ICD-10-CM | POA: Diagnosis not present

## 2019-09-21 DIAGNOSIS — I5023 Acute on chronic systolic (congestive) heart failure: Secondary | ICD-10-CM | POA: Diagnosis not present

## 2019-09-21 DIAGNOSIS — I130 Hypertensive heart and chronic kidney disease with heart failure and stage 1 through stage 4 chronic kidney disease, or unspecified chronic kidney disease: Secondary | ICD-10-CM | POA: Diagnosis not present

## 2019-09-22 DIAGNOSIS — I2510 Atherosclerotic heart disease of native coronary artery without angina pectoris: Secondary | ICD-10-CM | POA: Diagnosis not present

## 2019-09-22 DIAGNOSIS — G4733 Obstructive sleep apnea (adult) (pediatric): Secondary | ICD-10-CM | POA: Diagnosis not present

## 2019-09-22 DIAGNOSIS — I5023 Acute on chronic systolic (congestive) heart failure: Secondary | ICD-10-CM | POA: Diagnosis not present

## 2019-09-22 DIAGNOSIS — I130 Hypertensive heart and chronic kidney disease with heart failure and stage 1 through stage 4 chronic kidney disease, or unspecified chronic kidney disease: Secondary | ICD-10-CM | POA: Diagnosis not present

## 2019-09-22 DIAGNOSIS — N183 Chronic kidney disease, stage 3 unspecified: Secondary | ICD-10-CM | POA: Diagnosis not present

## 2019-09-22 DIAGNOSIS — I255 Ischemic cardiomyopathy: Secondary | ICD-10-CM | POA: Diagnosis not present

## 2019-09-24 ENCOUNTER — Other Ambulatory Visit: Payer: Self-pay

## 2019-09-24 ENCOUNTER — Encounter: Payer: Self-pay | Admitting: Family

## 2019-09-24 ENCOUNTER — Ambulatory Visit: Payer: Medicare Other | Attending: Family | Admitting: Family

## 2019-09-24 VITALS — BP 124/68 | HR 59 | Resp 22 | Ht 74.0 in | Wt 248.0 lb

## 2019-09-24 DIAGNOSIS — I252 Old myocardial infarction: Secondary | ICD-10-CM | POA: Diagnosis not present

## 2019-09-24 DIAGNOSIS — G4733 Obstructive sleep apnea (adult) (pediatric): Secondary | ICD-10-CM | POA: Insufficient documentation

## 2019-09-24 DIAGNOSIS — I2510 Atherosclerotic heart disease of native coronary artery without angina pectoris: Secondary | ICD-10-CM | POA: Insufficient documentation

## 2019-09-24 DIAGNOSIS — Z95810 Presence of automatic (implantable) cardiac defibrillator: Secondary | ICD-10-CM | POA: Insufficient documentation

## 2019-09-24 DIAGNOSIS — Z8719 Personal history of other diseases of the digestive system: Secondary | ICD-10-CM | POA: Diagnosis not present

## 2019-09-24 DIAGNOSIS — Z951 Presence of aortocoronary bypass graft: Secondary | ICD-10-CM | POA: Diagnosis not present

## 2019-09-24 DIAGNOSIS — I255 Ischemic cardiomyopathy: Secondary | ICD-10-CM | POA: Diagnosis not present

## 2019-09-24 DIAGNOSIS — Z8546 Personal history of malignant neoplasm of prostate: Secondary | ICD-10-CM | POA: Diagnosis not present

## 2019-09-24 DIAGNOSIS — I130 Hypertensive heart and chronic kidney disease with heart failure and stage 1 through stage 4 chronic kidney disease, or unspecified chronic kidney disease: Secondary | ICD-10-CM | POA: Diagnosis not present

## 2019-09-24 DIAGNOSIS — I959 Hypotension, unspecified: Secondary | ICD-10-CM | POA: Insufficient documentation

## 2019-09-24 DIAGNOSIS — E785 Hyperlipidemia, unspecified: Secondary | ICD-10-CM | POA: Diagnosis not present

## 2019-09-24 DIAGNOSIS — Z79899 Other long term (current) drug therapy: Secondary | ICD-10-CM | POA: Diagnosis not present

## 2019-09-24 DIAGNOSIS — N184 Chronic kidney disease, stage 4 (severe): Secondary | ICD-10-CM | POA: Insufficient documentation

## 2019-09-24 DIAGNOSIS — J449 Chronic obstructive pulmonary disease, unspecified: Secondary | ICD-10-CM | POA: Diagnosis not present

## 2019-09-24 DIAGNOSIS — I5022 Chronic systolic (congestive) heart failure: Secondary | ICD-10-CM | POA: Diagnosis not present

## 2019-09-24 DIAGNOSIS — K219 Gastro-esophageal reflux disease without esophagitis: Secondary | ICD-10-CM | POA: Insufficient documentation

## 2019-09-24 DIAGNOSIS — I950 Idiopathic hypotension: Secondary | ICD-10-CM

## 2019-09-24 DIAGNOSIS — Z8249 Family history of ischemic heart disease and other diseases of the circulatory system: Secondary | ICD-10-CM | POA: Insufficient documentation

## 2019-09-24 DIAGNOSIS — Z7901 Long term (current) use of anticoagulants: Secondary | ICD-10-CM | POA: Diagnosis not present

## 2019-09-24 DIAGNOSIS — I4891 Unspecified atrial fibrillation: Secondary | ICD-10-CM | POA: Diagnosis not present

## 2019-09-24 DIAGNOSIS — Z950 Presence of cardiac pacemaker: Secondary | ICD-10-CM | POA: Diagnosis not present

## 2019-09-24 DIAGNOSIS — I472 Ventricular tachycardia: Secondary | ICD-10-CM | POA: Diagnosis not present

## 2019-09-24 NOTE — Progress Notes (Signed)
Patient ID: Rick Gilmore, male    DOB: [birth year], 78 y.o.   MRN: [id number]  HPI  Mr Loman is a 78 y/o male with a history of thrombocytopenia, obstructive sleep apnea (with CPAP), prostate cancer, MI, HTN, hyperlipidemia, GERD, COPD, atrial fibrillation, anemia, AICD and chronic heart failure.   Reviewed echo done 07/04/17 which showed an EF of 30% along with severe MR/TR. Previous echo was done 09/02/16 and showed an EF of 25% along with severe MR/TR. EF has decreased from 01/16/16 which showed an EF of 30-35% along with mild MR/TR.   Has had 5 admissions and 1 ED visit in the last 6 months. He states he was at [facility] 08/28/2019 because his ICD fired 4 times and he had an ablation for Vtach. Last admission 09/08/2019 for CHF was diuresed with IV lasix, went into ARF and had cardiology and nephrology consulted. He had a right paracentesis with 2.3L fluid removed. On discharge after 5 days, his entresto was stopped and torsemide decreased to 20mg  daily.   He presents today for his follow-up visit with a chief complaint of shortness of breath on minimal exertion. Walking to the front door will cause him to get short of breath. This is associated with fatigue, rhinorrhea, and bruising easily. He denies chest pain, cough, leg swelling, palpitations, abdominal distention, dizziness, and trouble sleeping. He weighs himself daily and his weight fluctuates between 245-249 pounds. He is not adding salt to his food. He states his cardiologist referred him to cardiac rehab. He currently has home health RN and PT come to his house twice a week. His cardiologist plans to change the generator on his ICD at the end of this month.   Past Medical History:  Diagnosis Date  . AICD (automatic cardioverter/defibrillator) present   . Anemia   . Arrhythmia    a fib  . Atrial fibrillation ([place])    on Coumadin  . CAD (coronary artery disease)   . Cancer [facility]) 2001   Prostate, XRT and implant  . CHF (congestive heart  failure) ([place])   . COPD (chronic obstructive pulmonary disease) ([place])   . Erectile dysfunction   . GERD (gastroesophageal reflux disease)   . Hyperlipidemia   . Hypertension   . Ischemic cardiomyopathy    s/p AICD/pacer 2009, replaced 2010 [facility]  . MI (myocardial infarction) ([place]) 1988  . Personal history of prostate cancer 2001   s/p XRT and implant ([place])  . Prostate cancer ([place])   . Sleep apnea, obstructive    uses CPAP nightly  . Thrombocytopenia ([place])   . Tubular adenoma    colon polyp  . Vitamin B 12 deficiency    Past Surgical History:  Procedure Laterality Date  . CARDIAC CATHETERIZATION    . COLONOSCOPY WITH PROPOFOL [name] 05/30/2015   Procedure: COLONOSCOPY WITH PROPOFOL;  Surgeon: Manya Silvas, MD;  Location: [facility] ENDOSCOPY;  Service: Endoscopy;  Laterality: [name];  . COLONOSCOPY WITH PROPOFOL [name] 07/16/2019   Procedure: COLONOSCOPY WITH PROPOFOL;  Surgeon: Toledo, Benay Pike, MD;  Location: [facility] ENDOSCOPY;  Service: Gastroenterology;  Laterality: [name];  . CORONARY ARTERY BYPASS GRAFT  1988  . ESOPHAGOGASTRODUODENOSCOPY [name] 05/30/2015   Procedure: ESOPHAGOGASTRODUODENOSCOPY (EGD);  Surgeon: Manya Silvas, MD;  Location: [facility] ENDOSCOPY;  Service: Endoscopy;  Laterality: [name];  . ESOPHAGOGASTRODUODENOSCOPY (EGD) WITH PROPOFOL [name] 07/16/2019   Procedure: ESOPHAGOGASTRODUODENOSCOPY (EGD) WITH PROPOFOL;  Surgeon: Toledo, Benay Pike, MD;  Location: [facility] ENDOSCOPY;  Service: Gastroenterology;  Laterality: [name];  . IMPLANTABLE CARDIOVERTER DEFIBRILLATOR  GENERATOR CHANGE  April 2014   Bi V RRR  . INSERT / REPLACE / REMOVE PACEMAKER  2014  . VASECTOMY     Family History  Problem Relation Age of Onset  . Hypertension Mother   . Hypertension Father   . Cancer Father        prostate, throat  . Emphysema Father   . Heart disease Maternal Grandmother   . Heart disease Paternal Grandmother    Social History   Tobacco Use  . Smoking status: Never Smoker  . Smokeless tobacco:  Never Used  Substance Use Topics  . Alcohol use: Yes    Alcohol/week: 4.0 standard drinks    Types: 2 Standard drinks or equivalent, 2 Cans of beer per week    Comment: Mixed drinks   No Known Allergies  Prior to Admission medications   Medication Sig Start Date End Date Taking? Authorizing Provider  albuterol (PROVENTIL HFA;VENTOLIN HFA) 108 (90 Base) MCG/ACT inhaler Inhale 2 puffs into the lungs every 6 (six) hours as needed for wheezing or shortness of breath. 12/26/18   Guse, Jacquelynn Cree, FNP  allopurinol (ZYLOPRIM) 100 MG tablet Take 1 tablet (100 mg total) by mouth daily. 08/17/19   Crecencio Mc, MD  amiodarone (PACERONE) 200 MG tablet Take 1 tablet (200 mg total) by mouth daily. 09/13/19   Vaughan Basta, MD  ANORO ELLIPTA 62.5-25 MCG/INH AEPB USE 1 INHALATION DAILY Patient taking differently: Inhale 1 puff into the lungs daily.  06/12/19   Wilhelmina Mcardle, MD  apixaban (ELIQUIS) 5 MG TABS tablet Take 5 mg by mouth 2 (two) times daily.  06/12/19   [provider]  colchicine 0.6 MG tablet Take 0.6 mg by mouth daily.    [provider]  Cyanocobalamin 1000 MCG SUBL Place 1 tablet (1,000 mcg total) under the tongue every morning. 06/15/17   Crecencio Mc, MD  escitalopram (LEXAPRO) 10 MG tablet Take 1 tablet (10 mg total) by mouth daily. 08/17/19   Crecencio Mc, MD  fluticasone (FLONASE) 50 MCG/ACT nasal spray USE 1 SPRAY IN EACH NOSTRIL DAILY Patient taking differently: Place 2 sprays into both nostrils daily.  05/15/18   Wilhelmina Mcardle, MD  isosorbide mononitrate (IMDUR) 30 MG 24 hr tablet Take 30 mg by mouth daily.    [provider]  metoprolol succinate (TOPROL-XL) 50 MG 24 hr tablet Take 1 tablet (50 mg total) by mouth daily. Take with or immediately following a meal. 09/14/19   Vaughan Basta, MD  montelukast (SINGULAIR) 10 MG tablet Take 1 tablet (10 mg total) by mouth at bedtime. 08/17/19   Crecencio Mc, MD  pantoprazole (PROTONIX) 40  MG tablet Take 1 tablet (40 mg total) by mouth daily. 05/10/19   Crecencio Mc, MD  potassium chloride SA (K-DUR) 20 MEQ tablet Take 1 tablet (20 mEq total) by mouth daily. 08/17/19   Crecencio Mc, MD  pravastatin (PRAVACHOL) 80 MG tablet Take 80 mg by mouth daily.    [provider]  spironolactone (ALDACTONE) 25 MG tablet Take 25 mg by mouth daily.    [provider]  torsemide (DEMADEX) 20 MG tablet Take 1 tablet (20 mg total) by mouth daily. 09/14/19   Vaughan Basta, MD   Review of Systems  Constitutional: Positive for fatigue (minimal). Negative for appetite change and fever.  HENT: Positive for rhinorrhea and tinnitus. Negative for congestion, ear pain, postnasal drip, sinus pressure and sore throat.   Eyes: Negative.  Respiratory: Positive for shortness of breath (with minimal exertion). Negative for cough and chest tightness.   Cardiovascular: Negative for chest pain, palpitations and leg swelling.  Gastrointestinal: Negative for abdominal distention and abdominal pain.  Endocrine: Negative.   Genitourinary: Negative.   Musculoskeletal: Negative for back pain and neck pain.  Skin: Negative.   Allergic/Immunologic: Negative.   Neurological: Negative for dizziness and light-headedness.  Hematological: Negative for adenopathy. Bruises/bleeds easily.  Psychiatric/Behavioral: Negative for dysphoric mood, sleep disturbance (wearing CPAP at night. sleeping on 1 pillow) and suicidal ideas. The patient is not nervous/anxious.    Vitals:   09/24/19 1147  BP: 124/68  Pulse: (!) 59  Resp: (!) 22  SpO2: 96%   Filed Weights   09/24/19 1147  Weight: 248 lb (112.5 kg)   Lab Results  Component Value Date   CREATININE 2.23 (H) 09/19/2019   CREATININE 1.49 (H) 09/12/2019   CREATININE 1.68 (H) 09/11/2019    Physical Exam  Constitutional: He is oriented to person, place, and time. He appears well-developed and well-nourished.  HENT:  Head: Normocephalic and  atraumatic.  Eyes: Pupils are equal, round, and reactive to light. Conjunctivae are normal.  Neck: Normal range of motion. Neck supple. No JVD present.  Cardiovascular: Normal rate and regular rhythm.  Pulmonary/Chest: Effort normal. He has no wheezes. He has no rales.  Abdominal: Soft. He exhibits no distension. There is no abdominal tenderness.  Musculoskeletal:        General: No tenderness or edema.  Neurological: He is alert and oriented to person, place, and time.  Skin: Skin is warm and dry.  Psychiatric: He has a normal mood and affect. His behavior is normal. Thought content normal.  Nursing note and vitals reviewed.  Assessment & Plan:  1: Chronic heart failure with reduced ejection fraction- - NYHA class III - euvolemic today - weighing daily at home. Reminded to call for an overnight weight gain of >2 pounds or a weekly weight gain of >5 pounds - weight down 20 pounds from last visit 8 months ago - not adding salt and trying to follow a 2000mg  sodium diet - torsemide decreased to 20mg  daily and entresto stopped after last hospitalization with worsening kidney function - saw cardiologoly Nehemiah Massed) 09/19/2019 - BNP from 09/08/2019 was 711.0 - saw pulmonologist Alva Garnet) 02/02/18. Recommend he make an appointment with pulmonologist soon since still having shortness of breath with exertion and no signs of fluid overload.  - made a referral to the paramedicine program with recent admissions/ED visits.  - received a flu vaccine this season  2: Hypotension- - BP looks good today  - saw PCP Derrel Nip) 09/17/2019 - BMP from 09/19/2019 reviewed and shows sodium 135, potassium 4.6, creatinine 2.23, and GFR 28  3: Chronic kidney disease stage 4- - Gave patient information for Dr. Juleen China who he saw during the last hospitalization. Will give referral if he needs one.  - Continue with current diuretic dose and monitor kidney function   Medication bottles were reviewed.   Follow up in  3 weeks or sooner for questions/problems.

## 2019-09-24 NOTE — Patient Instructions (Addendum)
Continue weighing daily and call for an overnight weight gain of >2 pounds or a weekly weight gain of >5 pounds.  Call for appointment with kidney doctor (Dr. Juleen China): [phone]  Call for an appointment with lung doctor (pulmonologist)  We will make a referral to paramedicine program. Nile Dear will call you. Her phone number is [phone]  Call us to make an appointment for 3 weeks

## 2019-09-25 DIAGNOSIS — N183 Chronic kidney disease, stage 3 unspecified: Secondary | ICD-10-CM | POA: Diagnosis not present

## 2019-09-25 DIAGNOSIS — I5023 Acute on chronic systolic (congestive) heart failure: Secondary | ICD-10-CM | POA: Diagnosis not present

## 2019-09-25 DIAGNOSIS — I130 Hypertensive heart and chronic kidney disease with heart failure and stage 1 through stage 4 chronic kidney disease, or unspecified chronic kidney disease: Secondary | ICD-10-CM | POA: Diagnosis not present

## 2019-09-25 DIAGNOSIS — I2510 Atherosclerotic heart disease of native coronary artery without angina pectoris: Secondary | ICD-10-CM | POA: Diagnosis not present

## 2019-09-25 DIAGNOSIS — G4733 Obstructive sleep apnea (adult) (pediatric): Secondary | ICD-10-CM | POA: Diagnosis not present

## 2019-09-25 DIAGNOSIS — I255 Ischemic cardiomyopathy: Secondary | ICD-10-CM | POA: Diagnosis not present

## 2019-09-26 ENCOUNTER — Telehealth: Payer: Self-pay | Admitting: Internal Medicine

## 2019-09-26 ENCOUNTER — Other Ambulatory Visit: Payer: Self-pay | Admitting: *Deleted

## 2019-09-26 NOTE — Telephone Encounter (Signed)
Mardene Celeste with [facility] calling and states that the patient cancelled appointments for 2 weeks for nursing and he cancelled home health aid.  CB#: [phone] (can leave voicemail)

## 2019-09-26 NOTE — Patient Outreach (Addendum)
[facility]) Care Management  09/26/2019  Rick Gilmore 04/15/1941 [id number]   EMMI: GENERAL DISCHARGE (RESOLVED) DAY# 1 AND 4 DATE: #1 10/10 AND #4 10/13 10/10 D/C PAPERS, 10/13 SAD/HOPELESS/EMPTY  OUTREACH #1-RN spoke with pt and verified clearance for the above EMMI. Pt states she has since review his discharge paper work and reports on the sad/hopeless/empty was an incorrect. Pt states he is actively participating in outings and is not depress or hopeless. Will close EMMu  Telephone Assessment -CHF   RN further explained [facility] services for enrollment due to pt's recent history of HF. Pt very receptive however did not have time to completed the initial assessment and requested a call back next week to complete the initial assessment.  Pt's medical history was discussed along with the recent admission with HF as pt states now at 30% ejection faction. RN was able to discuss the plan of care and focus concerning enrollment services for HF management. Pt also aware his provider (Dr. Derrel Nip) would be updated on his participation and how he is progress with quarterly reprots on his progress.  Pt agreed with the goals and interventions and willing to adhere to what has been discussed. Educated briefly on the CHF zones and verified pt is in the GREEN zone with no acute symptoms at this time. Pt denies any swelling, no coughing or issues with breathing. Also offered to sent EMMI printed material to further discuss HF and preventive measures. Plan of care generated accordingly and will be updated accordingly based upon pt's progress. All medication were reviewed along with goals of care.  THN CM Care Plan Problem One     Most Recent Value  Care Plan Problem One  Recent hospitalization with HF  Role Documenting the Problem One  Care Management Telephonic Coordinator  Care Plan for Problem One  Active  THN Long Term Goal   Pt will verbalized the action plan in the GREEN zone within the  next 90 days.  THN Long Term Goal Start Date  09/26/19  Interventions for Problem One Long Term Goal  Discussed the heart failure action plan and the importance of staying in the GREEN zone and what to do if acute symptoms should occur.  THN CM Short Term Goal #1   Pt will complete daily weights and document all readings within the next 30 days.  THN CM Short Term Goal #1 Start Date  09/26/19  Interventions for Short Term Goal #1  Discuss the importance of fluid retention and why daily weights should be completed daily for HF monitoring. Will educate when to contact her provider with 3 lbs over night and 5 lbs within one week with any precipitating symptoms pt awaret ocontact her provider.  THN CM Short Term Goal #2   Adherence with medical appointments over the next 30 days.  THN CM Short Term Goal #2 Start Date  09/26/19  Interventions for Short Term Goal #2  Will stress the improtance of attending all medical appointments to avoid acute events or related issues. Will discuss the risk if this medical condition is not monitored. Will encouraged adherence with all medical appointments accordingly.      Raina Mina, RN Care Management Coordinator [place] [facility] [phone]

## 2019-09-27 DIAGNOSIS — N183 Chronic kidney disease, stage 3 unspecified: Secondary | ICD-10-CM | POA: Diagnosis not present

## 2019-09-27 DIAGNOSIS — I2510 Atherosclerotic heart disease of native coronary artery without angina pectoris: Secondary | ICD-10-CM | POA: Diagnosis not present

## 2019-09-27 DIAGNOSIS — I130 Hypertensive heart and chronic kidney disease with heart failure and stage 1 through stage 4 chronic kidney disease, or unspecified chronic kidney disease: Secondary | ICD-10-CM | POA: Diagnosis not present

## 2019-09-27 DIAGNOSIS — G4733 Obstructive sleep apnea (adult) (pediatric): Secondary | ICD-10-CM | POA: Diagnosis not present

## 2019-09-27 DIAGNOSIS — I5023 Acute on chronic systolic (congestive) heart failure: Secondary | ICD-10-CM | POA: Diagnosis not present

## 2019-09-27 DIAGNOSIS — I255 Ischemic cardiomyopathy: Secondary | ICD-10-CM | POA: Diagnosis not present

## 2019-09-27 NOTE — Telephone Encounter (Signed)
FYI

## 2019-09-28 ENCOUNTER — Other Ambulatory Visit
Admission: RE | Admit: 2019-09-28 | Discharge: 2019-09-28 | Disposition: A | Discharge status: Home / Self Care | Payer: Medicare Other | Source: Ambulatory Visit | Attending: Cardiology | Admitting: Cardiology

## 2019-09-28 ENCOUNTER — Other Ambulatory Visit: Payer: Self-pay

## 2019-09-28 DIAGNOSIS — I248 Other forms of acute ischemic heart disease: Secondary | ICD-10-CM | POA: Diagnosis not present

## 2019-09-28 DIAGNOSIS — K7460 Unspecified cirrhosis of liver: Secondary | ICD-10-CM

## 2019-09-28 DIAGNOSIS — R0602 Shortness of breath: Secondary | ICD-10-CM | POA: Diagnosis not present

## 2019-09-28 DIAGNOSIS — I4821 Permanent atrial fibrillation: Secondary | ICD-10-CM

## 2019-09-28 DIAGNOSIS — R079 Chest pain, unspecified: Secondary | ICD-10-CM | POA: Diagnosis not present

## 2019-09-28 DIAGNOSIS — E785 Hyperlipidemia, unspecified: Secondary | ICD-10-CM | POA: Diagnosis not present

## 2019-09-28 DIAGNOSIS — I2510 Atherosclerotic heart disease of native coronary artery without angina pectoris: Secondary | ICD-10-CM

## 2019-09-28 DIAGNOSIS — Z01818 Encounter for other preprocedural examination: Secondary | ICD-10-CM | POA: Diagnosis not present

## 2019-09-28 DIAGNOSIS — I130 Hypertensive heart and chronic kidney disease with heart failure and stage 1 through stage 4 chronic kidney disease, or unspecified chronic kidney disease: Secondary | ICD-10-CM | POA: Diagnosis not present

## 2019-09-28 DIAGNOSIS — I5023 Acute on chronic systolic (congestive) heart failure: Secondary | ICD-10-CM | POA: Diagnosis not present

## 2019-09-28 DIAGNOSIS — Z95810 Presence of automatic (implantable) cardiac defibrillator: Secondary | ICD-10-CM

## 2019-09-28 DIAGNOSIS — I472 Ventricular tachycardia: Secondary | ICD-10-CM

## 2019-09-28 DIAGNOSIS — Z01812 Encounter for preprocedural laboratory examination: Secondary | ICD-10-CM | POA: Insufficient documentation

## 2019-09-28 DIAGNOSIS — Z20828 Contact with and (suspected) exposure to other viral communicable diseases: Secondary | ICD-10-CM | POA: Diagnosis not present

## 2019-09-28 DIAGNOSIS — E875 Hyperkalemia: Secondary | ICD-10-CM | POA: Diagnosis not present

## 2019-09-28 DIAGNOSIS — N400 Enlarged prostate without lower urinary tract symptoms: Secondary | ICD-10-CM | POA: Diagnosis not present

## 2019-09-28 DIAGNOSIS — G4733 Obstructive sleep apnea (adult) (pediatric): Secondary | ICD-10-CM

## 2019-09-28 DIAGNOSIS — J90 Pleural effusion, not elsewhere classified: Secondary | ICD-10-CM | POA: Diagnosis not present

## 2019-09-28 DIAGNOSIS — J449 Chronic obstructive pulmonary disease, unspecified: Secondary | ICD-10-CM

## 2019-09-28 DIAGNOSIS — N184 Chronic kidney disease, stage 4 (severe): Secondary | ICD-10-CM | POA: Diagnosis not present

## 2019-09-28 DIAGNOSIS — M109 Gout, unspecified: Secondary | ICD-10-CM

## 2019-09-28 DIAGNOSIS — N189 Chronic kidney disease, unspecified: Secondary | ICD-10-CM

## 2019-09-28 DIAGNOSIS — K219 Gastro-esophageal reflux disease without esophagitis: Secondary | ICD-10-CM

## 2019-09-28 DIAGNOSIS — I5022 Chronic systolic (congestive) heart failure: Secondary | ICD-10-CM

## 2019-09-29 LAB — SARS CORONAVIRUS 2 (TAT 6-24 HRS): SARS Coronavirus 2: NEGATIVE

## 2019-09-30 ENCOUNTER — Encounter: Payer: Self-pay | Admitting: Emergency Medicine

## 2019-09-30 ENCOUNTER — Other Ambulatory Visit: Payer: Self-pay

## 2019-09-30 ENCOUNTER — Telehealth (HOSPITAL_COMMUNITY): Payer: Self-pay

## 2019-09-30 ENCOUNTER — Inpatient Hospital Stay
Admission: EM | Admit: 2019-09-30 | Discharge: 2019-10-05 | DRG: 245 | Disposition: A | Discharge status: Home Health | Payer: Medicare Other | Attending: Internal Medicine | Admitting: Internal Medicine

## 2019-09-30 ENCOUNTER — Emergency Department: Payer: Medicare Other

## 2019-09-30 DIAGNOSIS — Z20828 Contact with and (suspected) exposure to other viral communicable diseases: Secondary | ICD-10-CM | POA: Diagnosis present

## 2019-09-30 DIAGNOSIS — I2510 Atherosclerotic heart disease of native coronary artery without angina pectoris: Secondary | ICD-10-CM | POA: Diagnosis present

## 2019-09-30 DIAGNOSIS — Z808 Family history of malignant neoplasm of other organs or systems: Secondary | ICD-10-CM

## 2019-09-30 DIAGNOSIS — N184 Chronic kidney disease, stage 4 (severe): Secondary | ICD-10-CM | POA: Diagnosis present

## 2019-09-30 DIAGNOSIS — Z825 Family history of asthma and other chronic lower respiratory diseases: Secondary | ICD-10-CM | POA: Diagnosis not present

## 2019-09-30 DIAGNOSIS — D631 Anemia in chronic kidney disease: Secondary | ICD-10-CM | POA: Diagnosis present

## 2019-09-30 DIAGNOSIS — E785 Hyperlipidemia, unspecified: Secondary | ICD-10-CM | POA: Diagnosis present

## 2019-09-30 DIAGNOSIS — I4891 Unspecified atrial fibrillation: Secondary | ICD-10-CM | POA: Diagnosis not present

## 2019-09-30 DIAGNOSIS — I5023 Acute on chronic systolic (congestive) heart failure: Secondary | ICD-10-CM | POA: Diagnosis present

## 2019-09-30 DIAGNOSIS — J449 Chronic obstructive pulmonary disease, unspecified: Secondary | ICD-10-CM | POA: Diagnosis present

## 2019-09-30 DIAGNOSIS — I480 Paroxysmal atrial fibrillation: Secondary | ICD-10-CM | POA: Diagnosis not present

## 2019-09-30 DIAGNOSIS — R0602 Shortness of breath: Secondary | ICD-10-CM

## 2019-09-30 DIAGNOSIS — Z86010 Personal history of colonic polyps: Secondary | ICD-10-CM

## 2019-09-30 DIAGNOSIS — I248 Other forms of acute ischemic heart disease: Secondary | ICD-10-CM | POA: Diagnosis present

## 2019-09-30 DIAGNOSIS — Z7901 Long term (current) use of anticoagulants: Secondary | ICD-10-CM

## 2019-09-30 DIAGNOSIS — Z951 Presence of aortocoronary bypass graft: Secondary | ICD-10-CM

## 2019-09-30 DIAGNOSIS — Z8546 Personal history of malignant neoplasm of prostate: Secondary | ICD-10-CM

## 2019-09-30 DIAGNOSIS — I340 Nonrheumatic mitral (valve) insufficiency: Secondary | ICD-10-CM | POA: Diagnosis present

## 2019-09-30 DIAGNOSIS — E875 Hyperkalemia: Secondary | ICD-10-CM | POA: Diagnosis present

## 2019-09-30 DIAGNOSIS — R079 Chest pain, unspecified: Secondary | ICD-10-CM | POA: Diagnosis not present

## 2019-09-30 DIAGNOSIS — D696 Thrombocytopenia, unspecified: Secondary | ICD-10-CM | POA: Diagnosis present

## 2019-09-30 DIAGNOSIS — R0789 Other chest pain: Secondary | ICD-10-CM | POA: Diagnosis not present

## 2019-09-30 DIAGNOSIS — I495 Sick sinus syndrome: Secondary | ICD-10-CM | POA: Diagnosis present

## 2019-09-30 DIAGNOSIS — K219 Gastro-esophageal reflux disease without esophagitis: Secondary | ICD-10-CM | POA: Diagnosis present

## 2019-09-30 DIAGNOSIS — Z8042 Family history of malignant neoplasm of prostate: Secondary | ICD-10-CM | POA: Diagnosis not present

## 2019-09-30 DIAGNOSIS — N183 Chronic kidney disease, stage 3 unspecified: Secondary | ICD-10-CM | POA: Diagnosis not present

## 2019-09-30 DIAGNOSIS — I252 Old myocardial infarction: Secondary | ICD-10-CM | POA: Diagnosis not present

## 2019-09-30 DIAGNOSIS — I130 Hypertensive heart and chronic kidney disease with heart failure and stage 1 through stage 4 chronic kidney disease, or unspecified chronic kidney disease: Principal | ICD-10-CM | POA: Diagnosis present

## 2019-09-30 DIAGNOSIS — Z923 Personal history of irradiation: Secondary | ICD-10-CM | POA: Diagnosis not present

## 2019-09-30 DIAGNOSIS — Z8249 Family history of ischemic heart disease and other diseases of the circulatory system: Secondary | ICD-10-CM | POA: Diagnosis not present

## 2019-09-30 DIAGNOSIS — I10 Essential (primary) hypertension: Secondary | ICD-10-CM | POA: Diagnosis not present

## 2019-09-30 DIAGNOSIS — Z4502 Encounter for adjustment and management of automatic implantable cardiac defibrillator: Secondary | ICD-10-CM | POA: Diagnosis not present

## 2019-09-30 DIAGNOSIS — N189 Chronic kidney disease, unspecified: Secondary | ICD-10-CM | POA: Diagnosis not present

## 2019-09-30 DIAGNOSIS — I499 Cardiac arrhythmia, unspecified: Secondary | ICD-10-CM | POA: Diagnosis not present

## 2019-09-30 DIAGNOSIS — Z95810 Presence of automatic (implantable) cardiac defibrillator: Secondary | ICD-10-CM | POA: Diagnosis not present

## 2019-09-30 DIAGNOSIS — I255 Ischemic cardiomyopathy: Secondary | ICD-10-CM | POA: Diagnosis present

## 2019-09-30 DIAGNOSIS — E538 Deficiency of other specified B group vitamins: Secondary | ICD-10-CM | POA: Diagnosis present

## 2019-09-30 DIAGNOSIS — M109 Gout, unspecified: Secondary | ICD-10-CM | POA: Diagnosis present

## 2019-09-30 DIAGNOSIS — I959 Hypotension, unspecified: Secondary | ICD-10-CM | POA: Diagnosis not present

## 2019-09-30 DIAGNOSIS — G4730 Sleep apnea, unspecified: Secondary | ICD-10-CM | POA: Diagnosis not present

## 2019-09-30 DIAGNOSIS — R069 Unspecified abnormalities of breathing: Secondary | ICD-10-CM | POA: Diagnosis not present

## 2019-09-30 DIAGNOSIS — I5022 Chronic systolic (congestive) heart failure: Secondary | ICD-10-CM | POA: Diagnosis not present

## 2019-09-30 DIAGNOSIS — R0689 Other abnormalities of breathing: Secondary | ICD-10-CM | POA: Diagnosis not present

## 2019-09-30 LAB — CBC WITH DIFFERENTIAL/PLATELET
Abs Immature Granulocytes: 0.07 10*3/uL (ref 0.00–0.07)
Basophils Absolute: 0.1 10*3/uL (ref 0.0–0.1)
Basophils Relative: 0 %
Eosinophils Absolute: 0 10*3/uL (ref 0.0–0.5)
Eosinophils Relative: 0 %
HCT: 32.6 % — ABNORMAL LOW (ref 39.0–52.0)
Hemoglobin: 10.4 g/dL — ABNORMAL LOW (ref 13.0–17.0)
Immature Granulocytes: 1 %
Lymphocytes Relative: 6 %
Lymphs Abs: 0.7 10*3/uL (ref 0.7–4.0)
MCH: 30.9 pg (ref 26.0–34.0)
MCHC: 31.9 g/dL (ref 30.0–36.0)
MCV: 96.7 fL (ref 80.0–100.0)
Monocytes Absolute: 1 10*3/uL (ref 0.1–1.0)
Monocytes Relative: 8 %
Neutro Abs: 10.6 10*3/uL — ABNORMAL HIGH (ref 1.7–7.7)
Neutrophils Relative %: 85 %
Platelets: 137 10*3/uL — ABNORMAL LOW (ref 150–400)
RBC: 3.37 MIL/uL — ABNORMAL LOW (ref 4.22–5.81)
RDW: 16.8 % — ABNORMAL HIGH (ref 11.5–15.5)
WBC: 12.5 10*3/uL — ABNORMAL HIGH (ref 4.0–10.5)
nRBC: 0 % (ref 0.0–0.2)

## 2019-09-30 LAB — COMPREHENSIVE METABOLIC PANEL
ALT: 12 U/L (ref 0–44)
AST: 17 U/L (ref 15–41)
Albumin: 4.2 g/dL (ref 3.5–5.0)
Alkaline Phosphatase: 59 U/L (ref 38–126)
Anion gap: 11 (ref 5–15)
BUN: 35 mg/dL — ABNORMAL HIGH (ref 8–23)
CO2: 22 mmol/L (ref 22–32)
Calcium: 9.4 mg/dL (ref 8.9–10.3)
Chloride: 102 mmol/L (ref 98–111)
Creatinine, Ser: 2.35 mg/dL — ABNORMAL HIGH (ref 0.61–1.24)
GFR calc Af Amer: 30 mL/min — ABNORMAL LOW (ref >60)
GFR calc non Af Amer: 26 mL/min — ABNORMAL LOW (ref >60)
Glucose, Bld: 117 mg/dL — ABNORMAL HIGH (ref 70–99)
Potassium: 5.6 mmol/L — ABNORMAL HIGH (ref 3.5–5.1)
Sodium: 135 mmol/L (ref 135–145)
Total Bilirubin: 1.5 mg/dL — ABNORMAL HIGH (ref 0.3–1.2)
Total Protein: 7.2 g/dL (ref 6.5–8.1)

## 2019-09-30 LAB — TROPONIN I (HIGH SENSITIVITY)
Troponin I (High Sensitivity): 30 ng/L — ABNORMAL HIGH (ref <18)
Troponin I (High Sensitivity): 32 ng/L — ABNORMAL HIGH (ref <18)

## 2019-09-30 LAB — BRAIN NATRIURETIC PEPTIDE: B Natriuretic Peptide: 1277 pg/mL — ABNORMAL HIGH (ref 0.0–100.0)

## 2019-09-30 LAB — TSH: TSH: 2.104 u[IU]/mL (ref 0.350–4.500)

## 2019-09-30 MED ORDER — ALBUTEROL SULFATE (2.5 MG/3ML) 0.083% IN NEBU: 2.5000 mg | INHALATION_SOLUTION | RESPIRATORY_TRACT | Status: DC | PRN

## 2019-09-30 MED ORDER — FUROSEMIDE 10 MG/ML IJ SOLN
80.0000 mg | INTRAMUSCULAR | Status: AC
  Administered 2019-09-30: 22:00:00 80 mg via INTRAVENOUS
  Filled 2019-09-30: qty 8

## 2019-09-30 MED ORDER — DOCUSATE SODIUM 100 MG PO CAPS
100.0000 mg | ORAL_CAPSULE | Freq: Two times a day (BID) | ORAL | Status: DC
  Administered 2019-09-30 – 2019-10-04 (×2): 100 mg via ORAL
  Filled 2019-09-30 (×6): qty 1

## 2019-09-30 MED ORDER — ACETAMINOPHEN 650 MG RE SUPP: 650.0000 mg | Freq: Four times a day (QID) | RECTAL | Status: DC | PRN

## 2019-09-30 MED ORDER — MORPHINE SULFATE (PF) 2 MG/ML IV SOLN: 2.0000 mg | Freq: Once | INTRAVENOUS | Status: DC

## 2019-09-30 MED ORDER — ONDANSETRON HCL 4 MG/2ML IJ SOLN
4.0000 mg | Freq: Four times a day (QID) | INTRAMUSCULAR | Status: DC | PRN
  Administered 2019-10-02: 4 mg via INTRAVENOUS

## 2019-09-30 MED ORDER — FUROSEMIDE 10 MG/ML IJ SOLN
60.0000 mg | Freq: Four times a day (QID) | INTRAMUSCULAR | Status: DC
  Administered 2019-10-01 (×2): 60 mg via INTRAVENOUS
  Filled 2019-09-30 (×2): qty 6

## 2019-09-30 MED ORDER — NITROGLYCERIN 0.4 MG SL SUBL: 0.4000 mg | SUBLINGUAL_TABLET | SUBLINGUAL | Status: DC | PRN

## 2019-09-30 MED ORDER — ACETAMINOPHEN 325 MG PO TABS: 650.0000 mg | ORAL_TABLET | Freq: Four times a day (QID) | ORAL | Status: DC | PRN

## 2019-09-30 MED ORDER — ONDANSETRON HCL 4 MG PO TABS: 4.0000 mg | ORAL_TABLET | Freq: Four times a day (QID) | ORAL | Status: DC | PRN

## 2019-09-30 MED ORDER — HEPARIN SODIUM (PORCINE) 5000 UNIT/ML IJ SOLN
5000.0000 [IU] | Freq: Three times a day (TID) | INTRAMUSCULAR | Status: DC
  Administered 2019-09-30 – 2019-10-02 (×4): 5000 [IU] via SUBCUTANEOUS
  Filled 2019-09-30 (×4): qty 1

## 2019-09-30 NOTE — ED Triage Notes (Signed)
Pt presents to ED c/o palpitations and SOB starting today. Hx CHF, COPD, ICD present - due to be replaced next Tuesday per pt. Pt has had 2 admission in last 1.27mo for V tach, CHF/fluid overload. Pt states last admission he had large amount of pleural fluid drained. Stopped taking his eliquis Friday in preparation for ICD replacement.

## 2019-09-30 NOTE — H&P (Signed)
Rick Gilmore is an 78 y.o. male.   Chief Complaint: Shortness of breath HPI: The patient with past medical history of atrial fibrillation, coronary artery disease, ischemic cardiomyopathy and COPD presents to the emergency department complaining of shortness of breath.  The patient reports that he felt persistent palpitations "like thumping with every inhalation" in his left lower chest a few hours prior to seeking evaluation in the emergency department.  He also reports worsening dyspnea for approximately 1 to 2 days.  He denies chest pain.  Chest x-ray showed an interval increase in his right-sided pleural effusion.  (Notably the patient had a thoracentesis of the left chest in the recent past).  BNP was greater than 1000.  The patient was given IV Lasix which improved his breathing prior to the emergency department staff called the hospitalist service for admission.  Past Medical History:  Diagnosis Date  . AICD (automatic cardioverter/defibrillator) present   . Anemia   . Arrhythmia    a fib  . Atrial fibrillation ([place])    on Coumadin  . CAD (coronary artery disease)   . Cancer [facility]) 2001   Prostate, XRT and implant  . CHF (congestive heart failure) ([place])   . COPD (chronic obstructive pulmonary disease) ([place])   . Erectile dysfunction   . GERD (gastroesophageal reflux disease)   . Hyperlipidemia   . Hypertension   . Ischemic cardiomyopathy    s/p AICD/pacer 2009, replaced 2010 [facility]  . MI (myocardial infarction) ([place]) 1988  . Personal history of prostate cancer 2001   s/p XRT and implant ([place])  . Prostate cancer ([place])   . Sleep apnea, obstructive    uses CPAP nightly  . Thrombocytopenia ([place])   . Tubular adenoma    colon polyp  . Vitamin B 12 deficiency     Past Surgical History:  Procedure Laterality Date  . CARDIAC CATHETERIZATION    . COLONOSCOPY WITH PROPOFOL [name] 05/30/2015   Procedure: COLONOSCOPY WITH PROPOFOL;  Surgeon: Manya Silvas, MD;  Location: [facility] ENDOSCOPY;   Service: Endoscopy;  Laterality: [name];  . COLONOSCOPY WITH PROPOFOL [name] 07/16/2019   Procedure: COLONOSCOPY WITH PROPOFOL;  Surgeon: Toledo, Benay Pike, MD;  Location: [facility] ENDOSCOPY;  Service: Gastroenterology;  Laterality: [name];  . CORONARY ARTERY BYPASS GRAFT  1988  . ESOPHAGOGASTRODUODENOSCOPY [name] 05/30/2015   Procedure: ESOPHAGOGASTRODUODENOSCOPY (EGD);  Surgeon: Manya Silvas, MD;  Location: [facility] ENDOSCOPY;  Service: Endoscopy;  Laterality: [name];  . ESOPHAGOGASTRODUODENOSCOPY (EGD) WITH PROPOFOL [name] 07/16/2019   Procedure: ESOPHAGOGASTRODUODENOSCOPY (EGD) WITH PROPOFOL;  Surgeon: Toledo, Benay Pike, MD;  Location: [facility] ENDOSCOPY;  Service: Gastroenterology;  Laterality: [name];  . IMPLANTABLE CARDIOVERTER DEFIBRILLATOR GENERATOR CHANGE  April 2014   Bi V RRR  . INSERT / REPLACE / REMOVE PACEMAKER  2014  . VASECTOMY      Family History  Problem Relation Age of Onset  . Hypertension Mother   . Hypertension Father   . Cancer Father        prostate, throat  . Emphysema Father   . Heart disease Maternal Grandmother   . Heart disease Paternal Grandmother    Social History:  reports that he has never smoked. He has never used smokeless tobacco. He reports current alcohol use of about 4.0 standard drinks of alcohol per week. He reports that he does not use drugs.  Allergies: No Known Allergies  (Not in a hospital admission)   Results for orders placed or performed during the hospital encounter of 09/30/19 (from the past 48 hour(s))  Comprehensive metabolic panel     Status: Abnormal   Collection Time: 09/30/19  5:19 PM  Result Value Ref Range   Sodium 135 135 - 145 mmol/L   Potassium 5.6 (H) 3.5 - 5.1 mmol/L   Chloride 102 98 - 111 mmol/L   CO2 22 22 - 32 mmol/L   Glucose, Bld 117 (H) 70 - 99 mg/dL   BUN 35 (H) 8 - 23 mg/dL   Creatinine, Ser 2.35 (H) 0.61 - 1.24 mg/dL   Calcium 9.4 8.9 - 10.3 mg/dL   Total Protein 7.2 6.5 - 8.1 g/dL   Albumin 4.2 3.5 - 5.0 g/dL   AST 17 15 - 41  U/L   ALT 12 0 - 44 U/L   Alkaline Phosphatase 59 38 - 126 U/L   Total Bilirubin 1.5 (H) 0.3 - 1.2 mg/dL   GFR calc non Af Amer 26 (L) >60 mL/min   GFR calc Af Amer 30 (L) >60 mL/min   Anion gap 11 5 - 15    Comment: Performed at [facility], [address] [place], [place] [address]  CBC with Differential     Status: Abnormal   Collection Time: 09/30/19  5:19 PM  Result Value Ref Range   WBC 12.5 (H) 4.0 - 10.5 K/uL   RBC 3.37 (L) 4.22 - 5.81 MIL/uL   Hemoglobin 10.4 (L) 13.0 - 17.0 g/dL   HCT 32.6 (L) 39.0 - 52.0 %   MCV 96.7 80.0 - 100.0 fL   MCH 30.9 26.0 - 34.0 pg   MCHC 31.9 30.0 - 36.0 g/dL   RDW 16.8 (H) 11.5 - 15.5 %   Platelets 137 (L) 150 - 400 K/uL   nRBC 0.0 0.0 - 0.2 %   Neutrophils Relative % 85 %   Neutro Abs 10.6 (H) 1.7 - 7.7 K/uL   Lymphocytes Relative 6 %   Lymphs Abs 0.7 0.7 - 4.0 K/uL   Monocytes Relative 8 %   Monocytes Absolute 1.0 0.1 - 1.0 K/uL   Eosinophils Relative 0 %   Eosinophils Absolute 0.0 0.0 - 0.5 K/uL   Basophils Relative 0 %   Basophils Absolute 0.1 0.0 - 0.1 K/uL   Immature Granulocytes 1 %   Abs Immature Granulocytes 0.07 0.00 - 0.07 K/uL    Comment: Performed at [facility], [place], [place] [address]  Troponin I (High Sensitivity)     Status: Abnormal   Collection Time: 09/30/19  5:19 PM  Result Value Ref Range   Troponin I (High Sensitivity) 30 (H) <18 ng/L    Comment: (NOTE) Elevated high sensitivity troponin I (hsTnI) values and significant  changes across serial measurements may suggest ACS but many other  chronic and acute conditions are known to elevate hsTnI results.  Refer to the "Links" section for chest pain algorithms and additional  guidance. Performed at [facility], [address] [place], [place] [address]   Brain natriuretic peptide     Status: Abnormal   Collection Time: 09/30/19  5:19 PM  Result Value Ref Range   B Natriuretic Peptide 1,277.0 (H) 0.0 - 100.0  pg/mL    Comment: Performed at [facility], [address] [place], [place] [address]   Dg Chest Portable 1 View  Result Date: 09/30/2019 CLINICAL DATA:  Shortness of breath EXAM: PORTABLE CHEST 1 VIEW COMPARISON:  09/12/2019 FINDINGS: Interval increase in a right-sided pleural effusion, small to moderate, with associated atelectasis or consolidation. Gross cardiomegaly with  left chest multi lead pacer defibrillator status post median sternotomy. IMPRESSION: 1. Interval increase in a right-sided pleural effusion, small to moderate, with associated atelectasis or consolidation. 2. Gross cardiomegaly with left chest multi lead pacer defibrillator status post median sternotomy. Electronically Signed   By: Eddie Candle M.D.   On: 09/30/2019 18:03    Review of Systems  Constitutional: Negative for chills and fever.  HENT: Negative for sore throat and tinnitus.   Eyes: Negative for blurred vision and redness.  Respiratory: Positive for shortness of breath. Negative for cough.   Cardiovascular: Positive for chest pain and palpitations. Negative for orthopnea and PND.  Gastrointestinal: Negative for abdominal pain, diarrhea, nausea and vomiting.  Genitourinary: Negative for dysuria, frequency and urgency.  Musculoskeletal: Negative for joint pain and myalgias.  Skin: Negative for rash.       No lesions  Neurological: Negative for speech change, focal weakness and weakness.  Endo/Heme/Allergies: Does not bruise/bleed easily.       No temperature intolerance  Psychiatric/Behavioral: Negative for depression and suicidal ideas.    Blood pressure 101/61, pulse 70, temperature 98.6 F (37 C), temperature source Oral, resp. rate (!) 23, height 6\' 2"  (1.88 m), weight 113 kg, SpO2 94 %. Physical Exam  Vitals reviewed. Constitutional: He is oriented to person, place, and time. He appears well-developed and well-nourished. No distress.  HENT:  Head: Normocephalic and atraumatic.   Mouth/Throat: Oropharynx is clear and moist.  Eyes: Pupils are equal, round, and reactive to light. Conjunctivae and EOM are normal. No scleral icterus.  Neck: Normal range of motion. Neck supple. No JVD present. No tracheal deviation present. No thyromegaly present.  Cardiovascular: Normal rate and regular rhythm. Exam reveals no gallop and no friction rub.  No murmur heard. Respiratory: Tachypnea noted. He has rales.  GI: Soft. Bowel sounds are normal. He exhibits no distension. There is no abdominal tenderness.  Genitourinary:    Genitourinary Comments: Deferred   Musculoskeletal: Normal range of motion.        General: No edema.  Lymphadenopathy:    He has no cervical adenopathy.  Neurological: He is alert and oriented to person, place, and time. No cranial nerve deficit.  Skin: Skin is warm and dry. No rash noted. No erythema.  Psychiatric: He has a normal mood and affect. His behavior is normal. Judgment and thought content normal.     Assessment/Plan This is a 78 year old male admitted for CHF exacerbation. 1.  CHF: Acute on chronic; systolic.  EF per echo 3 years ago 30 to 35%.  Continue diuresis with IV Lasix.  I have scheduled 3 doses every 6 hours.  Reassess tomorrow morning to determine if patient may resume oral torsemide.  If IV diuresis still needed, also consider thoracentesis of right-sided pleural effusion.  Obtain echocardiogram to evaluate interval decline in cardiac function. 2.  Atypical chest pain: Troponin elevated but stable.  The patient has a pacemaker/AICD that was due to be changed in 3 days.  He reports that it fired a few days ago which could be the etiology of elevated troponin.  Differential diagnosis also includes demand ischemia.  The patient is currently chest pain-free.  Follow cardiac biomarkers.  Monitor telemetry.  Consult cardiology.  Continue Imdur 3.  Hypertension: Controlled; continue metoprolol and spironolactone. 4.  CKD:  Stage IV; monitor renal  function. 5.  Atrial fibrillation: Rate controlled; tinea amiodarone.  Continue Eliquis for anticoagulation 6.  Gout: Stable; continue renally adjusted allopurinol.  Colchicine as needed 7.  Hyperlipidemia: Continue statin therapy 8.  COPD: Stable; continue Anoro Ellipta.  Albuterol as needed 9.  DVT prophylaxis: Therapeutic anticoagulation 10.  GI prophylaxis: PPI per home regimen The patient is a full code.  Time spent on admission orders and patient care approximately 45 minutes  Harrie Foreman, MD 09/30/2019, 8:39 PM

## 2019-09-30 NOTE — ED Provider Notes (Signed)
[facility] Emergency Department Provider Note   ____________________________________________    I have reviewed the triage vital signs and the nursing notes.   HISTORY  Chief Complaint Shortness of Breath and Palpitations     HPI Rick Gilmore is a 78 y.o. male with extensive past medical history including ischemic cardiomyopathy, CHF, COPD, atrial fibrillation anticoagulated who presents with complaints of shortness of breath and some palpitations.  Review of records demonstrates the patient has had recurrent pleural effusions which have required thoracentesis, recent ablation for sustained ventricular tachycardia at [facility].  Patient denies fevers or chills, mild cough.  Reports worsening shortness of breath over the last several days.  Stop taking Eliquis on Friday for upcoming procedure  Past Medical History:  Diagnosis Date  . AICD (automatic cardioverter/defibrillator) present   . Anemia   . Arrhythmia    a fib  . Atrial fibrillation ([place])    on Coumadin  . CAD (coronary artery disease)   . [facility] [address]) 2001   Prostate, XRT and implant  . CHF (congestive heart failure) ([place])   . COPD (chronic obstructive pulmonary disease) ([place])   . Erectile dysfunction   . GERD (gastroesophageal reflux disease)   . Hyperlipidemia   . Hypertension   . Ischemic cardiomyopathy    s/p AICD/pacer 2009, replaced 2010 [facility]  . MI (myocardial infarction) ([place]) 1988  . Personal history of prostate cancer 2001   s/p XRT and implant ([place])  . Prostate cancer ([place])   . Sleep apnea, obstructive    uses CPAP nightly  . Thrombocytopenia ([place])   . Tubular adenoma    colon polyp  . Vitamin B 12 deficiency     Patient Active Problem List   Diagnosis Date Noted  . Pleural effusion due to CHF (congestive heart failure) ([place]) 09/17/2019  . Hospital discharge follow-up 08/19/2019  . V tach ([place]) 08/09/2019  . Defibrillator discharge 08/09/2019  . V-tach ([place])  08/09/2019  . 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 Portal hypertension ([place]) 07/19/2019  . 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 Obesity 04/20/2016  . Hearing loss of left ear due to cerumen impaction 04/19/2016  . Hypotension 03/10/2016  . Allergic rhinitis 02/02/2016  . Acute on chronic systolic CHF (congestive heart failure) ([place]) 01/16/2016  . Chronic renal insufficiency 01/16/2016  . Prediabetes 01/16/2016  . 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 S/P implantation of automatic cardioverter/defibrillator (AICD) 10/21/2015  . Diverticulosis of colon without hemorrhage 10/21/2015  . Internal hemorrhoids 10/21/2015  . Atrial fibrillation ([place]) 10/15/2015  . Tubular adenoma of colon 06/04/2015  . COPD (chronic obstructive pulmonary disease) ([place]) 04/20/2015  . Iron deficiency anemia 04/13/2015  . Thrombocytopenia ([place]) 12/01/2014  . Hyperlipidemia LDL goal <70 12/01/2014  . Chronic systolic heart failure ([place]) 10/08/2014  . Long term current use of anticoagulant therapy 11/02/2011  . Pernicious anemia 11/02/2011  . Ischemic cardiomyopathy   . Sleep apnea, obstructive     Past Surgical History:  Procedure Laterality Date  . CARDIAC CATHETERIZATION    . COLONOSCOPY WITH PROPOFOL [name] 05/30/2015   Procedure: COLONOSCOPY WITH PROPOFOL;  Surgeon: Manya Silvas, MD;  Location: [facility] ENDOSCOPY;  Service: Endoscopy;  Laterality: [name];  . COLONOSCOPY WITH PROPOFOL [name] 07/16/2019   Procedure: COLONOSCOPY WITH PROPOFOL;  Surgeon: Toledo, Benay Pike, MD;  Location: [facility] ENDOSCOPY;  Service: Gastroenterology;  Laterality: [name];  . CORONARY ARTERY BYPASS GRAFT  1988  . ESOPHAGOGASTRODUODENOSCOPY [name] 05/30/2015   Procedure: ESOPHAGOGASTRODUODENOSCOPY (EGD);  Surgeon: Manya Silvas, MD;  Location[facility] ENDOSCOPY;  Service: Endoscopy;  Laterality: [name];  . ESOPHAGOGASTRODUODENOSCOPY (EGD) WITH PROPOFOL [name] 07/16/2019   Procedure: ESOPHAGOGASTRODUODENOSCOPY (EGD) WITH PROPOFOL;   Surgeon: Toledo, Benay Pike, MD;  Location: [facility] ENDOSCOPY;  Service: Gastroenterology;  Laterality: [name];  . IMPLANTABLE CARDIOVERTER DEFIBRILLATOR GENERATOR CHANGE  April 2014   Bi V RRR  . INSERT / REPLACE / REMOVE PACEMAKER  2014  . VASECTOMY      Prior to Admission medications   Medication Sig Start Date End Date Taking? Authorizing Provider  albuterol (PROVENTIL HFA;VENTOLIN HFA) 108 (90 Base) MCG/ACT inhaler Inhale 2 puffs into the lungs every 6 (six) hours as needed for wheezing or shortness of breath. 12/26/18   Guse, Jacquelynn Cree, FNP  allopurinol (ZYLOPRIM) 100 MG tablet Take 1 tablet (100 mg total) by mouth daily. 08/17/19   Crecencio Mc, MD  amiodarone (PACERONE) 200 MG tablet Take 1 tablet (200 mg total) by mouth daily. 09/13/19   Vaughan Basta, MD  ANORO ELLIPTA 62.5-25 MCG/INH AEPB USE 1 INHALATION DAILY Patient taking differently: Inhale 1 puff into the lungs daily.  06/12/19   Wilhelmina Mcardle, MD  apixaban (ELIQUIS) 5 MG TABS tablet Take 5 mg by mouth 2 (two) times daily.  06/12/19   [provider]  colchicine 0.6 MG tablet Take 0.6 mg by mouth as needed.     [provider]  Cyanocobalamin 1000 MCG SUBL Place 1 tablet (1,000 mcg total) under the tongue every morning. 06/15/17   Crecencio Mc, MD  escitalopram (LEXAPRO) 10 MG tablet Take 1 tablet (10 mg total) by mouth daily. 08/17/19   Crecencio Mc, MD  fluticasone (FLONASE) 50 MCG/ACT nasal spray USE 1 SPRAY IN EACH NOSTRIL DAILY Patient taking differently: Place 2 sprays into both nostrils daily.  05/15/18   Wilhelmina Mcardle, MD  isosorbide mononitrate (IMDUR) 30 MG 24 hr tablet Take 30 mg by mouth daily.    [provider]  magnesium oxide (MAG-OX) 400 MG tablet Take 400 mg by mouth 2 (two) times daily.    [provider]  metoprolol succinate (TOPROL-XL) 50 MG 24 hr tablet Take 1 tablet (50 mg total) by mouth daily. Take with or immediately following a meal. 09/14/19   Vaughan Basta, MD  montelukast (SINGULAIR) 10 MG tablet Take 1 tablet (10 mg total) by mouth at bedtime. 08/17/19   Crecencio Mc, MD  pantoprazole (PROTONIX) 40 MG tablet Take 1 tablet (40 mg total) by mouth daily. 05/10/19   Crecencio Mc, MD  potassium chloride SA (K-DUR) 20 MEQ tablet Take 1 tablet (20 mEq total) by mouth daily. 08/17/19   Crecencio Mc, MD  pravastatin (PRAVACHOL) 80 MG tablet Take 80 mg by mouth daily.    [provider]  spironolactone (ALDACTONE) 25 MG tablet Take 25 mg by mouth daily.    [provider]  torsemide (DEMADEX) 20 MG tablet Take 1 tablet (20 mg total) by mouth daily. 09/14/19   Vaughan Basta, MD     Allergies Patient has no known allergies.  Family History  Problem Relation Age of Onset  . Hypertension Mother   . Hypertension Father   . Cancer Father        prostate, throat  . Emphysema Father   . Heart disease Maternal Grandmother   . Heart disease Paternal Grandmother     Social History Social History   Tobacco Use  . Smoking status: Never Smoker  .  Smokeless tobacco: Never Used  Substance Use Topics  . Alcohol use: Yes    Alcohol/week: 4.0 standard drinks    Types: 2 Standard drinks or equivalent, 2 Cans of beer per week    Comment: Mixed drinks  . Drug use: No    Review of Systems  Constitutional: No fever/chills Eyes: No visual changes.  ENT: No sore throat. Cardiovascular: As above Respiratory: As above Gastrointestinal: No abdominal pain.   Genitourinary: Negative for dysuria. Musculoskeletal: Negative for back pain. Skin: Negative for rash. Neurological: Negative for headaches    ____________________________________________   PHYSICAL EXAM:  VITAL SIGNS: ED Triage Vitals  Enc Vitals Group     BP 09/30/19 1716 109/65     Pulse Rate 09/30/19 1716 71     Resp 09/30/19 1716 (!) 24     Temp 09/30/19 1716 98.6 F (37 C)     Temp Source 09/30/19 1716 Oral     SpO2 09/30/19 1716 99 %      Weight 09/30/19 1712 113 kg (249 lb 1.9 oz)     Height 09/30/19 1712 1.88 m (6\' 2" )     Head Circumference --      Peak Flow --      Pain Score 09/30/19 1712 0     Pain Loc --      Pain Edu? --      Excl. in [place]? --     Constitutional: Alert and oriented.   Nose: No congestion/rhinnorhea. Mouth/Throat: Mucous membranes are moist.    Cardiovascular: Normal rate, regular rhythm.   Good peripheral circulation. Respiratory: Normal respiratory effort.  No retractions.  Decreased lung sounds on the right, scattered rales noted Gastrointestinal: Soft and nontender. No distention.    Musculoskeletal:   Warm and well perfused Neurologic:  Normal speech and language. No gross focal neurologic deficits are appreciated.  Skin:  Skin is warm, dry and intact. No rash noted. Psychiatric: Mood and affect are normal. Speech and behavior are normal.  ____________________________________________   LABS (all labs ordered are listed, but only abnormal results are displayed)  Labs Reviewed  COMPREHENSIVE METABOLIC PANEL - Abnormal; Notable for the following components:      Result Value   Potassium 5.6 (*)    Glucose, Bld 117 (*)    BUN 35 (*)    Creatinine, Ser 2.35 (*)    Total Bilirubin 1.5 (*)    GFR calc non Af Amer 26 (*)    GFR calc Af Amer 30 (*)    All other components within normal limits  CBC WITH DIFFERENTIAL/PLATELET - Abnormal; Notable for the following components:   WBC 12.5 (*)    RBC 3.37 (*)    Hemoglobin 10.4 (*)    HCT 32.6 (*)    RDW 16.8 (*)    Platelets 137 (*)    Neutro Abs 10.6 (*)    All other components within normal limits  BRAIN NATRIURETIC PEPTIDE - Abnormal; Notable for the following components:   B Natriuretic Peptide 1,277.0 (*)    All other components within normal limits  TROPONIN I (HIGH SENSITIVITY) - Abnormal; Notable for the following components:   Troponin I (High Sensitivity) 30 (*)    All other components within normal limits    ____________________________________________  EKG  ____________________________________________  RADIOLOGY  Chest x-ray demonstrates increased right-sided pleural effusion ____________________________________________   PROCEDURES  Procedure(s) performed: No  Procedures   Critical Care performed: No ____________________________________________   INITIAL IMPRESSION / ASSESSMENT AND PLAN /  ED COURSE  Pertinent labs & imaging results that were available during my care of the patient were reviewed by me and considered in my medical decision making (see chart for details).  Patient with atypical chest pain as above, troponin is essentially at his baseline.  Elevated BNP with redevelopment of effusion.  Mildly elevated potassium.  Spoke with Dr. Nehemiah Massed his cardiologist who is overall reassured by his tests however patient continues to feel chest discomfort and does not feel comfortable going home will discuss with hospitalist for admission    ____________________________________________   FINAL CLINICAL IMPRESSION(S) / ED DIAGNOSES  Final diagnoses:  Chest pain, unspecified type  Shortness of breath  Hyperkalemia        Note:  This document was prepared using Dragon voice recognition software and may include unintentional dictation errors.   Lavonia Drafts, MD 09/30/19 Karl Bales

## 2019-10-01 ENCOUNTER — Other Ambulatory Visit: Payer: Self-pay | Admitting: *Deleted

## 2019-10-01 ENCOUNTER — Inpatient Hospital Stay
Admit: 2019-10-01 | Discharge: 2019-10-01 | Disposition: A | Discharge status: Home / Self Care | Payer: Medicare Other | Attending: Internal Medicine | Admitting: Internal Medicine

## 2019-10-01 LAB — BASIC METABOLIC PANEL
Anion gap: 12 (ref 5–15)
BUN: 31 mg/dL — ABNORMAL HIGH (ref 8–23)
CO2: 21 mmol/L — ABNORMAL LOW (ref 22–32)
Calcium: 9.1 mg/dL (ref 8.9–10.3)
Chloride: 100 mmol/L (ref 98–111)
Creatinine, Ser: 2.19 mg/dL — ABNORMAL HIGH (ref 0.61–1.24)
GFR calc Af Amer: 32 mL/min — ABNORMAL LOW (ref >60)
GFR calc non Af Amer: 28 mL/min — ABNORMAL LOW (ref >60)
Glucose, Bld: 129 mg/dL — ABNORMAL HIGH (ref 70–99)
Potassium: 4.3 mmol/L (ref 3.5–5.1)
Sodium: 133 mmol/L — ABNORMAL LOW (ref 135–145)

## 2019-10-01 LAB — CBC
HCT: 30.1 % — ABNORMAL LOW (ref 39.0–52.0)
Hemoglobin: 9.6 g/dL — ABNORMAL LOW (ref 13.0–17.0)
MCH: 30.8 pg (ref 26.0–34.0)
MCHC: 31.9 g/dL (ref 30.0–36.0)
MCV: 96.5 fL (ref 80.0–100.0)
Platelets: 83 10*3/uL — ABNORMAL LOW (ref 150–400)
RBC: 3.12 MIL/uL — ABNORMAL LOW (ref 4.22–5.81)
RDW: 16.8 % — ABNORMAL HIGH (ref 11.5–15.5)
WBC: 7.6 10*3/uL (ref 4.0–10.5)
nRBC: 0 % (ref 0.0–0.2)

## 2019-10-01 LAB — ECHOCARDIOGRAM COMPLETE
Height: 74 in
Weight: 3899.2 oz

## 2019-10-01 LAB — TROPONIN I (HIGH SENSITIVITY): Troponin I (High Sensitivity): 34 ng/L — ABNORMAL HIGH (ref <18)

## 2019-10-01 MED ORDER — FUROSEMIDE 10 MG/ML IJ SOLN
40.0000 mg | Freq: Two times a day (BID) | INTRAMUSCULAR | Status: DC
  Administered 2019-10-01 – 2019-10-05 (×7): 40 mg via INTRAVENOUS
  Filled 2019-10-01 (×8): qty 4

## 2019-10-01 MED ORDER — SALINE SPRAY 0.65 % NA SOLN
1.0000 | NASAL | Status: DC | PRN
  Administered 2019-10-01: 1 via NASAL
  Filled 2019-10-01: qty 44

## 2019-10-01 NOTE — Consult Note (Signed)
[facility] Consultation Note  Patient ID: Rick Gilmore, MRN: [id number], DOB/AGE: 1941-07-31 78 y.o. Admit date: 09/30/2019   Date of Consult: 10/01/2019 Primary Physician: Crecencio Mc, MD Primary Cardiologist: Nehemiah Massed  Chief Complaint:  Chief Complaint  Patient presents with  . Shortness of Breath  . Palpitations   Reason for Consult: Heart failure  HPI: 78 y.o. male with known chronic systolic dysfunction congestive heart failure with LV systolic ejection fraction of 30% with paroxysmal nonvalvular atrial fibrillation hypertension hyperlipidemia previously on appropriate medication management.  The patient has had an ICD placed in 2009 replaced in 2010 and is currently at the low battery and needs to have a generator change out.  The patient did have an episode of incessant ventricular tachycardia for which he had some battery drain.  He was placed on amiodarone for atrial fibrillation as well as ventricular tachycardia and has had no further episodes at this current's.  Additionally the patient did have a V. tach ablation which was successful as well.  He has had recurrent pleural effusions mainly on the right due to congestive heart failure LV dysfunction chronic kidney disease stage IV for which oral Lasix has helped but intravenous Lasix and thoracentesis has helped as well.  The patient now has had slightly increased amount of right pleural effusion which may be causing some thumping and chest discomfort and decrease energy.  He has received intravenous Lasix with significant improvements of his symptoms and current BNP was 1277.  The patient did have a troponin level of 34 more consistent with demand ischemia rather than acute coronary syndrome.  Past Medical History:  Diagnosis Date  . AICD (automatic cardioverter/defibrillator) present   . Anemia   . Arrhythmia    a fib  . Atrial fibrillation ([place])    on Coumadin  . CAD (coronary artery disease)   . Cancer  [facility]) 2001   Prostate, XRT and implant  . CHF (congestive heart failure) ([place])   . COPD (chronic obstructive pulmonary disease) ([place])   . Erectile dysfunction   . GERD (gastroesophageal reflux disease)   . Hyperlipidemia   . Hypertension   . Ischemic cardiomyopathy    s/p AICD/pacer 2009, replaced 2010 [facility]  . MI (myocardial infarction) ([place]) 1988  . Personal history of prostate cancer 2001   s/p XRT and implant ([place])  . Prostate cancer ([place])   . Sleep apnea, obstructive    uses CPAP nightly  . Thrombocytopenia ([place])   . Tubular adenoma    colon polyp  . Vitamin B 12 deficiency       Surgical History:  Past Surgical History:  Procedure Laterality Date  . CARDIAC CATHETERIZATION    . COLONOSCOPY WITH PROPOFOL [name] 05/30/2015   Procedure: COLONOSCOPY WITH PROPOFOL;  Surgeon: Manya Silvas, MD;  Location: [facility] ENDOSCOPY;  Service: Endoscopy;  Laterality: [name];  . COLONOSCOPY WITH PROPOFOL [name] 07/16/2019   Procedure: COLONOSCOPY WITH PROPOFOL;  Surgeon: Toledo, Benay Pike, MD;  Location: [facility] ENDOSCOPY;  Service: Gastroenterology;  Laterality: [name];  . CORONARY ARTERY BYPASS GRAFT  1988  . ESOPHAGOGASTRODUODENOSCOPY [name] 05/30/2015   Procedure: ESOPHAGOGASTRODUODENOSCOPY (EGD);  Surgeon: Manya Silvas, MD;  Location: [facility] ENDOSCOPY;  Service: Endoscopy;  Laterality: [name];  . ESOPHAGOGASTRODUODENOSCOPY (EGD) WITH PROPOFOL [name] 07/16/2019   Procedure: ESOPHAGOGASTRODUODENOSCOPY (EGD) WITH PROPOFOL;  Surgeon: Toledo, Benay Pike, MD;  Location: [facility] ENDOSCOPY;  Service: Gastroenterology;  Laterality: [name];  . IMPLANTABLE CARDIOVERTER DEFIBRILLATOR GENERATOR CHANGE  April 2014   Bi V RRR  .  INSERT / REPLACE / REMOVE PACEMAKER  2014  . VASECTOMY       Home Meds: Prior to Admission medications   Medication Sig Start Date End Date Taking? Authorizing Provider  albuterol (PROVENTIL HFA;VENTOLIN HFA) 108 (90 Base) MCG/ACT inhaler Inhale 2 puffs into the lungs every 6 (six) hours as needed for  wheezing or shortness of breath. 12/26/18  Yes Guse, Jacquelynn Cree, FNP  allopurinol (ZYLOPRIM) 100 MG tablet Take 1 tablet (100 mg total) by mouth daily. 08/17/19  Yes Crecencio Mc, MD  amiodarone (PACERONE) 200 MG tablet Take 1 tablet (200 mg total) by mouth daily. 09/13/19  Yes Vaughan Basta, MD  ANORO ELLIPTA 62.5-25 MCG/INH AEPB USE 1 INHALATION DAILY Patient taking differently: Inhale 1 puff into the lungs daily.  06/12/19  Yes Wilhelmina Mcardle, MD  Cyanocobalamin 1000 MCG SUBL Place 1 tablet (1,000 mcg total) under the tongue every morning. 06/15/17  Yes Crecencio Mc, MD  escitalopram (LEXAPRO) 10 MG tablet Take 1 tablet (10 mg total) by mouth daily. 08/17/19  Yes Crecencio Mc, MD  fluticasone (FLONASE) 50 MCG/ACT nasal spray USE 1 SPRAY IN EACH NOSTRIL DAILY Patient taking differently: Place 2 sprays into both nostrils daily.  05/15/18  Yes Wilhelmina Mcardle, MD  isosorbide mononitrate (IMDUR) 30 MG 24 hr tablet Take 30 mg by mouth daily.   Yes [provider]  magnesium oxide (MAG-OX) 400 MG tablet Take 400 mg by mouth 2 (two) times daily.   Yes [provider]  metoprolol succinate (TOPROL-XL) 50 MG 24 hr tablet Take 1 tablet (50 mg total) by mouth daily. Take with or immediately following a meal. 09/14/19  Yes Vaughan Basta, MD  montelukast (SINGULAIR) 10 MG tablet Take 1 tablet (10 mg total) by mouth at bedtime. 08/17/19  Yes Crecencio Mc, MD  pantoprazole (PROTONIX) 40 MG tablet Take 1 tablet (40 mg total) by mouth daily. 05/10/19  Yes Crecencio Mc, MD  potassium chloride SA (K-DUR) 20 MEQ tablet Take 1 tablet (20 mEq total) by mouth daily. 08/17/19  Yes Crecencio Mc, MD  pravastatin (PRAVACHOL) 80 MG tablet Take 80 mg by mouth daily.   Yes [provider]  spironolactone (ALDACTONE) 25 MG tablet Take 25 mg by mouth daily.   Yes [provider]  torsemide (DEMADEX) 20 MG tablet Take 1 tablet (20 mg total) by mouth daily. 09/14/19  Yes  Vaughan Basta, MD  apixaban (ELIQUIS) 5 MG TABS tablet Take 5 mg by mouth 2 (two) times daily.  06/12/19   [provider]  colchicine 0.6 MG tablet Take 0.6 mg by mouth as needed.     [provider]    Inpatient Medications:  . docusate sodium  100 mg Oral BID  . furosemide  60 mg Intravenous Q6H  . heparin  5,000 Units Subcutaneous Q8H     Allergies: No Known Allergies  Social History   Socioeconomic History  . Marital status: Divorced    Spouse name: Not on file  . Number of children: Not on file  . Years of education: Not on file  . Highest education level: Not on file  Occupational History  . Occupation: Retired  Scientific laboratory technician  . Financial resource strain: Not hard at all  . Food insecurity    Worry: Never true    Inability: Never true  . Transportation needs    Medical: No    Non-medical: No  Tobacco Use  . Smoking status: Never  Smoker  . Smokeless tobacco: Never Used  Substance and Sexual Activity  . Alcohol use: Yes    Alcohol/week: 4.0 standard drinks    Types: 2 Standard drinks or equivalent, 2 Cans of beer per week    Comment: Mixed drinks  . Drug use: No  . Sexual activity: Never  Lifestyle  . Physical activity    Days per week: 0 days    Minutes per session: 0 min  . Stress: Not at all  Relationships  . Social connections    Talks on phone: More than three times a week    Gets together: Three times a week    Attends religious service: More than 4 times per year    Active member of club or organization: Yes    Attends meetings of clubs or organizations: More than 4 times per year    Relationship status: Divorced  . Intimate partner violence    Fear of current or ex partner: No    Emotionally abused: No    Physically abused: No    Forced sexual activity: No  Other Topics Concern  . Not on file  Social History Narrative  . Not on file     Family History  Problem Relation Age of Onset  . Hypertension Mother   .  Hypertension Father   . Cancer Father        prostate, throat  . Emphysema Father   . Heart disease Maternal Grandmother   . Heart disease Paternal Grandmother      Review of Systems Positive for shortness of breath cough congestion Negative for: General:  chills, fever, night sweats or weight changes.  Cardiovascular: PND orthopnea syncope dizziness  Dermatological skin lesions rashes Respiratory: Positive for cough congestion Urologic: Frequent urination urination at night and hematuria Abdominal: negative for nausea, vomiting, diarrhea, bright red blood per rectum, melena, or hematemesis Neurologic: negative for visual changes, and/or hearing changes  All other systems reviewed and are otherwise negative except as noted above.  Labs: No results for input(s): CKTOTAL, CKMB, TROPONINI in the last 72 hours. Lab Results  Component Value Date   WBC 12.5 (H) 09/30/2019   HGB 10.4 (L) 09/30/2019   HCT 32.6 (L) 09/30/2019   MCV 96.7 09/30/2019   PLT 137 (L) 09/30/2019    Recent Labs  Lab 09/30/19 1719  NA 135  K 5.6*  CL 102  CO2 22  BUN 35*  CREATININE 2.35*  CALCIUM 9.4  PROT 7.2  BILITOT 1.5*  ALKPHOS 59  ALT 12  AST 17  GLUCOSE 117*   Lab Results  Component Value Date   CHOL 147 05/28/2019   HDL 38.50 (L) 05/28/2019   LDLCALC 89 05/28/2019   TRIG 102.0 05/28/2019   No results found for: DDIMER  Radiology/Studies:  Dg Chest 1 View  Result Date: 09/10/2019 CLINICAL DATA:  Status post right thoracentesis EXAM: CHEST  1 VIEW COMPARISON:  09/10/2019 FINDINGS: Interval right thoracentesis has been performed with resolution of the previously seen right-sided pleural effusion. No pneumothorax is noted. The left lung is clear. Defibrillator is again noted and stable. Postsurgical changes are seen and stable. Vascular congestion has improved from the earlier exam. IMPRESSION: No pneumothorax following right-sided thoracentesis. No residual fluid is seen. Previously  seen vascular congestion has improved in the interval. Electronically Signed   By: Inez Catalina M.D.   On: 09/10/2019 15:24   Dg Chest 2 View  Result Date: 09/12/2019 CLINICAL DATA:  Shortness of breath, history  of right-sided thoracentesis 2 days ago EXAM: CHEST - 2 VIEW COMPARISON:  09/10/2019 FINDINGS: Cardiac shadow is enlarged. Defibrillator is again noted. The lungs are well aerated bilaterally without focal infiltrate. Small tiny pleural effusions are noted bilaterally. Some associated mild atelectatic changes are noted. No pneumothorax is seen. No bony abnormality is noted. IMPRESSION: Tiny bilateral pleural effusions. Mild bibasilar atelectasis. Electronically Signed   By: Inez Catalina M.D.   On: 09/12/2019 17:50   US Renal  Result Date: 09/09/2019 CLINICAL DATA:  Acute renal failure. Chronic kidney disease stage 3. EXAM: RENAL / URINARY TRACT ULTRASOUND COMPLETE COMPARISON:  06/13/2014 FINDINGS: Right Kidney: Renal measurements: 11.5 x 5.4 x 5.3 cm = volume: 171 mL . Echogenicity within normal limits. No mass or hydronephrosis visualized. Left Kidney: Renal measurements: 4.0 x 4.8 x 7.2 cm = volume: 216 mL. Echogenicity within normal limits. No mass or hydronephrosis visualized. Bladder: Empty. IMPRESSION: Normal size kidneys without hydronephrosis. Electronically Signed   By: Marin Olp M.D.   On: 09/09/2019 16:33   Dg Chest Portable 1 View  Result Date: 09/30/2019 CLINICAL DATA:  Shortness of breath EXAM: PORTABLE CHEST 1 VIEW COMPARISON:  09/12/2019 FINDINGS: Interval increase in a right-sided pleural effusion, small to moderate, with associated atelectasis or consolidation. Gross cardiomegaly with left chest multi lead pacer defibrillator status post median sternotomy. IMPRESSION: 1. Interval increase in a right-sided pleural effusion, small to moderate, with associated atelectasis or consolidation. 2. Gross cardiomegaly with left chest multi lead pacer defibrillator status post median  sternotomy. Electronically Signed   By: Eddie Candle M.D.   On: 09/30/2019 18:03   Dg Chest Port 1 View  Result Date: 09/10/2019 CLINICAL DATA:  Follow-up pleural effusion EXAM: PORTABLE CHEST 1 VIEW COMPARISON:  09/08/2019 FINDINGS: Cardiac shadow is again enlarged. Defibrillator is again noted and stable. Aortic calcifications are seen. Elevation of the right hemidiaphragm is noted consistent with volume loss. Right pleural effusion and basilar atelectasis is noted. Mild vascular congestion is again seen. IMPRESSION: Increasing opacity in the right lung base consistent with a combination of effusion and atelectasis. Vascular congestion is noted which is stable. Electronically Signed   By: Inez Catalina M.D.   On: 09/10/2019 07:53   Dg Chest Portable 1 View  Result Date: 09/08/2019 CLINICAL DATA:  Dyspnea with exertion. EXAM: PORTABLE CHEST 1 VIEW COMPARISON:  08/21/2019 FINDINGS: Stable enlarged cardiac silhouette, post CABG changes and left subclavian pacer and AICD leads. Stable prominence of the pulmonary vasculature and interstitial markings. Interval small to moderate-sized right pleural effusion. No acute bony abnormality. IMPRESSION: 1. Interval small to moderate-sized right pleural effusion. 2. Stable cardiomegaly, pulmonary vascular congestion and mild chronic interstitial lung disease. Electronically Signed   By: Claudie Revering M.D.   On: 09/08/2019 20:19   US Thoracentesis Asp Pleural Space W/img Guide  Result Date: 09/10/2019 INDICATION: Right-sided pleural effusion EXAM: ULTRASOUND GUIDED RIGHT THORACENTESIS MEDICATIONS: None. COMPLICATIONS: None immediate. PROCEDURE: An ultrasound guided thoracentesis was thoroughly discussed with the patient and questions answered. The benefits, risks, alternatives and complications were also discussed. The patient understands and wishes to proceed with the procedure. Written consent was obtained. Ultrasound was performed to localize and mark an adequate  pocket of fluid in the right chest. The area was then prepped and draped in the normal sterile fashion. 1% Lidocaine was used for local anesthesia. Under ultrasound guidance a 6 Fr Safe-T-Centesis catheter was introduced. Thoracentesis was performed. The catheter was removed and a dressing applied. FINDINGS: A total of approximately  2.3 L of clear yellow fluid was removed. Samples were sent to the laboratory as requested by the clinical team. IMPRESSION: Successful ultrasound guided right thoracentesis yielding 2.3 L of pleural fluid. Electronically Signed   By: Inez Catalina M.D.   On: 09/10/2019 15:23    EKG: Normal sinus rhythm  Weights: Filed Weights   09/30/19 1712 09/30/19 2201  Weight: 113 kg 110.5 kg     Physical Exam: Blood pressure (!) 104/51, pulse 70, temperature 97.9 F (36.6 C), temperature source Oral, resp. rate 17, height 6\' 2"  (1.88 m), weight 110.5 kg, SpO2 90 %. Body mass index is 31.29 kg/m. General: Well developed, well nourished, in no acute distress. Head eyes ears nose throat: Normocephalic, atraumatic, sclera non-icteric, no xanthomas, nares are without discharge. No apparent thyromegaly and/or mass  Lungs: Normal respiratory effort.  Few wheezes, few rales, no rhonchi.  Slight decreased breath sounds in the right base Heart: RRR with normal S1 S2. no murmur gallop, no rub, PMI is normal size and placement, carotid upstroke normal without bruit, jugular venous pressure is normal Abdomen: Soft, non-tender, distended with normoactive bowel sounds. No hepatomegaly. No rebound/guarding. No obvious abdominal masses. Abdominal aorta is normal size without bruit Extremities: Trace edema. no cyanosis, no clubbing, no ulcers  Peripheral : 2+ bilateral upper extremity pulses, 2+ bilateral femoral pulses, 2+ bilateral dorsal pedal pulse Neuro: Alert and oriented. No facial asymmetry. No focal deficit. Moves all extremities spontaneously. Musculoskeletal: Normal muscle tone  without kyphosis Psych:  Responds to questions appropriately with a normal affect.    Assessment: 78 year old male with acute on chronic systolic dysfunction heart failure with right pleural effusion slightly worse than before and elevated BNP and minimal elevation of troponin consistent with demand ischemia multifactorial in nature including heart dysfunction chronic kidney disease anemia improved with medication management including diuretics.  Plan: 1.  Continue intravenous Lasix for pleural effusion and returned oral medication management after tomorrow 2.  Continue to watch closely for anemia chronic kidney disease is contributing to above 3.  Patient is planned for defibrillator battery change out as of tomorrow which will likely proceed without concerns of significant issues listed above 4.  Continue amiodarone for V. tach in the past and atrial fibrillation 5.  Further treatment options after above  Signed, Corey Skains M.D. [facility] 10/01/2019, 12:43 PM

## 2019-10-01 NOTE — TOC Initial Note (Signed)
Transition of Care [name]) - Initial/Assessment Note    Patient Details  Name: Rick Gilmore MRN: [id number] Date of Birth: 11/14/1941  Transition of Care [facility]) CM/SW Contact:    Candie Chroman, LCSW Phone Number: 10/01/2019, 4:07 PM  Clinical Narrative:  Readmission prevention screen complete. CSW met with patient. No supports at bedside. CSW introduced role and explained that discharge planning would be discussed. Patient confirmed he is active with [facility] for PT and nursing. He has a walking stick, cane, and walker at home. PCP is Dr. Derrel Nip. Pharmacy is either [facility] through [facility] on [address]. Patient still drives. No issues affording medications. No further concerns. CSW encouraged patient to contact CSW as needed. CSW will continue to follow patient for support and facilitate return home when stable.                Expected Discharge Plan: [place] Barriers to Discharge: Continued Medical Work up   Patient Goals and CMS Choice     Choice offered to / list presented to : NA  Expected Discharge Plan and Services Expected Discharge Plan: [place] Choice: [place] arrangements for the past 2 months: [place] Arranged: RN, PT [facility] Agency: [place] ([place]) Date [place]: 10/01/19   Representative spoke with at [place]: Floydene Flock  Prior Living Arrangements/Services Living arrangements for the past 2 months: [place] Lives with:: Self Patient language and need for interpreter reviewed:: Yes Do you feel safe going back to the place where you live?: Yes      Need for Family Participation in Patient Care: Yes (Comment)   Current home services: Home PT, Home RN Criminal Activity/Legal Involvement Pertinent to Current Situation/Hospitalization: No - Comment as  needed  Activities of Daily Living Home Assistive Devices/Equipment: CPAP, Hearing aid, Eyeglasses ADL Screening (condition at time of admission) Patient's cognitive ability adequate to safely complete daily activities?: Yes Is the patient deaf or have difficulty hearing?: No Does the patient have difficulty seeing, even when wearing glasses/contacts?: No Does the patient have difficulty concentrating, remembering, or making decisions?: No Patient able to express need for assistance with ADLs?: Yes Does the patient have difficulty dressing or bathing?: No Independently performs ADLs?: Yes (appropriate for developmental age) Does the patient have difficulty walking or climbing stairs?: No Weakness of Legs: None Weakness of Arms/Hands: None  Permission Sought/Granted Permission sought to share information with : Facility Art therapist granted to share information with : Yes, Verbal Permission Granted     Permission granted to share info w AGENCY: [facility]        Emotional Assessment Appearance:: Appears stated age Attitude/Demeanor/Rapport: Engaged, Gracious Affect (typically observed): Accepting, Appropriate, Calm, Pleasant Orientation: : Oriented to Self, Oriented to Place, Oriented to  Time, Oriented to Situation Alcohol / Substance Use: Never Used Psych Involvement: No (comment)  Admission diagnosis:  Shortness of breath [R06.02] Hyperkalemia [E87.5] Chest pain, unspecified type [R07.9] Patient Active Problem List   Diagnosis Date Noted  . Pleural effusion due to CHF (congestive heart failure) ([place]) 09/17/2019  . Hospital discharge follow-up 08/19/2019  . V tach ([place]) 08/09/2019  . Defibrillator discharge 08/09/2019  . V-tach ([place]) 08/09/2019  .  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 Portal hypertension ([place]) 07/19/2019  . 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 Obesity 04/20/2016  . Hearing loss of left ear due to cerumen impaction  04/19/2016  . Hypotension 03/10/2016  . Allergic rhinitis 02/02/2016  . Acute on chronic systolic CHF (congestive heart failure) ([place]) 01/16/2016  . Chronic renal insufficiency 01/16/2016  . Prediabetes 01/16/2016  . 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 S/P implantation of automatic cardioverter/defibrillator (AICD) 10/21/2015  . Diverticulosis of colon without hemorrhage 10/21/2015  . Internal hemorrhoids 10/21/2015  . Atrial fibrillation ([place]) 10/15/2015  . Tubular adenoma of colon 06/04/2015  . COPD (chronic obstructive pulmonary disease) ([place]) 04/20/2015  . Iron deficiency anemia 04/13/2015  . Thrombocytopenia ([place]) 12/01/2014  . Hyperlipidemia LDL goal <70 12/01/2014  . Chronic systolic heart failure ([place]) 10/08/2014  . Long term current use of anticoagulant therapy 11/02/2011  . Pernicious anemia 11/02/2011  . Ischemic cardiomyopathy   . Sleep apnea, obstructive    PCP:  Crecencio Mc, MD Pharmacy:   [facility], [place] [address] [place] [address] Phone: [phone] Fax: [facility] #[id number] [place], [place] - [place] AT [place] [address] [place] [address] Phone: [phone] Fax: [phone]     Social Determinants of Health ([place]) Interventions    Readmission Risk Interventions Readmission Risk Prevention Plan 10/01/2019 09/11/2019  Transportation Screening Complete Complete  PCP or Specialist Appt within 3-5 Days Complete Complete  HRI or Home Care Consult Complete Complete  Social Work Consult for [place] Planning/Counseling Complete Complete  Palliative Care Screening Not Applicable Not Applicable  Medication Review Press photographer) Complete Complete  Some recent data might be hidden

## 2019-10-01 NOTE — Telephone Encounter (Signed)
Rick Gilmore is a new patient that I have scheduled a home visit with this week.  His daughter contacted me today and advised he has been having palpatations with shortness of breath.  He is under quarantine for replacement of his pacemaker this week.  He is not sure to go to ED or not.  Advised her to call 911 and get them to check him out, they can decide if he needs to go to ED or not.  Will follow up on him.   [place] [phone]

## 2019-10-01 NOTE — Patient Outreach (Addendum)
[facility]) Care Management  10/01/2019  Rick Gilmore 1941-10-25 [id number]    Telephone Assessment-Post op ED visit 10/25  RN spoke with pt who indicates this ED visit was once again related to his pacer with extra fluid on board. States he will have a new ICD placed tomorrow that should resolve the issues surrounding his ongoing fluid retention. No immediate needs as RN will follow up with pt next week as scheduled after pt's procedure for replacing his ICD. No other issues presented at this time. Pt remains aware of the goals of care and will continue to adhere to this plan.  PLAN: Will continue pt next week and update the plan of care with goals and interventions.  Raina Mina, RN Care Management Coordinator [place] Office [phone]

## 2019-10-01 NOTE — Progress Notes (Signed)
*  PRELIMINARY RESULTS* Echocardiogram 2D Echocardiogram has been performed.  Sherrie Sport 10/01/2019, 11:17 AM

## 2019-10-01 NOTE — Progress Notes (Signed)
[place] at [place] NAME: Rick Gil[place]    MR#:  [id number]  DATE OF BIRTH:  1941-04-22  SUBJECTIVE:  CHIEF COMPLAINT: Patient is resting comfortably.  Shortness of breath is better.  Daughter at bedside.  REVIEW OF SYSTEMS:  CONSTITUTIONAL: No fever, fatigue or weakness.  EYES: No blurred or double vision.  EARS, NOSE, AND THROAT: No tinnitus or ear pain.  RESPIRATORY: No cough, improving shortness of breath, denies wheezing or hemoptysis.  CARDIOVASCULAR: No chest pain, orthopnea, edema.  GASTROINTESTINAL: No nausea, vomiting, diarrhea or abdominal pain.  GENITOURINARY: No dysuria, hematuria.  ENDOCRINE: No polyuria, nocturia,  HEMATOLOGY: No anemia, easy bruising or bleeding SKIN: No rash or lesion. MUSCULOSKELETAL: No joint pain or arthritis.   NEUROLOGIC: No tingling, numbness, weakness.  PSYCHIATRY: No anxiety or depression.   DRUG ALLERGIES:  No Known Allergies  VITALS:  Blood pressure (!) 104/51, pulse 70, temperature 97.9 F (36.6 C), temperature source Oral, resp. rate 17, height 6\' 2"  (1.88 m), weight 110.5 kg, SpO2 90 %.  PHYSICAL EXAMINATION:  GENERAL:  78 y.o.-year-old patient lying in the bed with no acute distress.  EYES: Pupils equal, round, reactive to light and accommodation. No scleral icterus. Extraocular muscles intact.  HEENT: Head atraumatic, normocephalic. Oropharynx and nasopharynx clear.  NECK:  Supple, no jugular venous distention. No thyroid enlargement, no tenderness.  LUNGS: Moderate breath sounds bilaterally, no wheezing, few rales,rhonchi, no crepitation. No use of accessory muscles of respiration.  CARDIOVASCULAR: S1, S2 normal. No murmurs, rubs, or gallops.  ABDOMEN: Soft, nontender, nondistended. Bowel sounds present.  EXTREMITIES: No pedal edema, cyanosis, or clubbing.  NEUROLOGIC: Cranial nerves II through XII are intact. Muscle strength 5/5 in all extremities. Sensation intact. Gait not  checked.  PSYCHIATRIC: The patient is alert and oriented x 3.  SKIN: No obvious rash, lesion, or ulcer.    LABORATORY PANEL:   CBC Recent Labs  Lab 09/30/19 1719  WBC 12.5*  HGB 10.4*  HCT 32.6*  PLT 137*   ------------------------------------------------------------------------------------------------------------------  Chemistries  Recent Labs  Lab 09/30/19 1719 10/01/19 1323  NA 135 133*  K 5.6* 4.3  CL 102 100  CO2 22 21*  GLUCOSE 117* 129*  BUN 35* 31*  CREATININE 2.35* 2.19*  CALCIUM 9.4 9.1  AST 17  --   ALT 12  --   ALKPHOS 59  --   BILITOT 1.5*  --    ------------------------------------------------------------------------------------------------------------------  Cardiac Enzymes No results for input(s): TROPONINI in the last 168 hours. ------------------------------------------------------------------------------------------------------------------  RADIOLOGY:  Dg Chest Portable 1 View  Result Date: 09/30/2019 CLINICAL DATA:  Shortness of breath EXAM: PORTABLE CHEST 1 VIEW COMPARISON:  09/12/2019 FINDINGS: Interval increase in a right-sided pleural effusion, small to moderate, with associated atelectasis or consolidation. Gross cardiomegaly with left chest multi lead pacer defibrillator status post median sternotomy. IMPRESSION: 1. Interval increase in a right-sided pleural effusion, small to moderate, with associated atelectasis or consolidation. 2. Gross cardiomegaly with left chest multi lead pacer defibrillator status post median sternotomy. Electronically Signed   By: Eddie Candle M.D.   On: 09/30/2019 18:03    EKG:   Orders placed or performed during the hospital encounter of 09/30/19  . ED EKG  . ED EKG    ASSESSMENT AND PLAN:    This is a 78 year old male admitted for CHF exacerbation. 1.  CHF: Acute on chronic; systolic.    With mild to moderate right-sided pleural effusion EF per echo 3 years ago  30 to 35%.  Continue diuresis with IV  Lasix.  Monitor renal function closely if creatinine bumped up from baseline to 219 today    Reassess tomorrow morning to determine if patient may resume oral torsemide.  If not clinically improving will consider thoracentesis of right-sided pleural effusion.  Obtain echocardiogram to evaluate interval decline in cardiac function. Cardiology Dr. Montine Circle is following Monitor daily weights, intake and output Repeat chest x-ray in a.m.  2.  Atypical chest pain: Troponin elevated but stable.  The patient has a pacemaker/AICD that was due to be changed in 3 days.  The pacemaker AICD battery will be changed tomorrow   Monitor telemetry.  Consult cardiology.  Continue Imdur 3.  Hypertension: Controlled; continue metoprolol and spironolactone. 4.  CKD:  Stage IV; monitor renal function.  Baseline creatinine seems to be at around 1.9 and currently it were 2.19.  We will continue close monitoring 5.  Atrial fibrillation: history of V. tach in the past :rate controlled; continue amiodarone.  Continue Eliquis for anticoagulation 6.  Gout: Stable; continue renally adjusted allopurinol.  Colchicine as needed 7.  Hyperlipidemia: Continue statin therapy 8.  COPD: Stable; continue Anoro Ellipta.  Albuterol as needed   .  DVT prophylaxis: Therapeutic anticoagulation   GI prophylaxis: PPI per home regimen    All the records are reviewed and case discussed with Care Management/Social Workerr. Management plans discussed with the patient, daughter at bed side and they are in agreement.  CODE STATUS:   TOTAL TIME TAKING CARE OF THIS PATIENT: 36  minutes.   POSSIBLE D/C IN  1-2  DAYS, DEPENDING ON CLINICAL CONDITION.  Note: This dictation was prepared with Dragon dictation along with smaller phrase technology. Any transcriptional errors that result from this process are unintentional.   Nicholes Mango M.D on 10/01/2019 at 2:55 PM  Between 7am to 6pm - Pager - [phone] After 6pm go to [email or web address] -  password [email or web address] [facility]  Office  [phone]  CC: Primary care physician; Crecencio Mc, MD

## 2019-10-02 ENCOUNTER — Inpatient Hospital Stay: Payer: Medicare Other | Admitting: Anesthesiology

## 2019-10-02 ENCOUNTER — Ambulatory Visit: Admission: RE | Admit: 2019-10-02 | Payer: Medicare Other | Source: Home / Self Care | Admitting: Cardiology

## 2019-10-02 ENCOUNTER — Encounter: Payer: Self-pay | Admitting: *Deleted

## 2019-10-02 ENCOUNTER — Encounter
Admission: EM | Disposition: A | Discharge status: Home Health | Payer: Self-pay | Source: Home / Self Care | Attending: Internal Medicine

## 2019-10-02 HISTORY — PX: IMPLANTABLE CARDIOVERTER DEFIBRILLATOR (ICD) GENERATOR CHANGE: SHX5469

## 2019-10-02 LAB — SURGICAL PCR SCREEN
MRSA, PCR: NEGATIVE
Staphylococcus aureus: NEGATIVE

## 2019-10-02 LAB — CBC WITH DIFFERENTIAL/PLATELET
Abs Immature Granulocytes: 0.03 10*3/uL (ref 0.00–0.07)
Basophils Absolute: 0 10*3/uL (ref 0.0–0.1)
Basophils Relative: 0 %
Eosinophils Absolute: 0 10*3/uL (ref 0.0–0.5)
Eosinophils Relative: 0 %
HCT: 28.8 % — ABNORMAL LOW (ref 39.0–52.0)
Hemoglobin: 9.2 g/dL — ABNORMAL LOW (ref 13.0–17.0)
Immature Granulocytes: 1 %
Lymphocytes Relative: 8 %
Lymphs Abs: 0.4 10*3/uL — ABNORMAL LOW (ref 0.7–4.0)
MCH: 30.6 pg (ref 26.0–34.0)
MCHC: 31.9 g/dL (ref 30.0–36.0)
MCV: 95.7 fL (ref 80.0–100.0)
Monocytes Absolute: 0.7 10*3/uL (ref 0.1–1.0)
Monocytes Relative: 13 %
Neutro Abs: 4.3 10*3/uL (ref 1.7–7.7)
Neutrophils Relative %: 78 %
Platelets: 76 10*3/uL — ABNORMAL LOW (ref 150–400)
RBC: 3.01 MIL/uL — ABNORMAL LOW (ref 4.22–5.81)
RDW: 16.2 % — ABNORMAL HIGH (ref 11.5–15.5)
WBC: 5.4 10*3/uL (ref 4.0–10.5)
nRBC: 0 % (ref 0.0–0.2)

## 2019-10-02 LAB — BASIC METABOLIC PANEL
Anion gap: 8 (ref 5–15)
BUN: 28 mg/dL — ABNORMAL HIGH (ref 8–23)
CO2: 24 mmol/L (ref 22–32)
Calcium: 8.9 mg/dL (ref 8.9–10.3)
Chloride: 101 mmol/L (ref 98–111)
Creatinine, Ser: 1.9 mg/dL — ABNORMAL HIGH (ref 0.61–1.24)
GFR calc Af Amer: 38 mL/min — ABNORMAL LOW (ref >60)
GFR calc non Af Amer: 33 mL/min — ABNORMAL LOW (ref >60)
Glucose, Bld: 111 mg/dL — ABNORMAL HIGH (ref 70–99)
Potassium: 4 mmol/L (ref 3.5–5.1)
Sodium: 133 mmol/L — ABNORMAL LOW (ref 135–145)

## 2019-10-02 SURGERY — ICD GENERATOR CHANGE
Anesthesia: General | Laterality: Left

## 2019-10-02 MED ORDER — LIDOCAINE HCL (PF) 1 % IJ SOLN
INTRAMUSCULAR | Status: DC | PRN
  Administered 2019-10-02: 17 mL

## 2019-10-02 MED ORDER — CEFAZOLIN SODIUM-DEXTROSE 2-4 GM/100ML-% IV SOLN
2.0000 g | INTRAVENOUS | Status: AC
  Administered 2019-10-02: 11:00:00 2 g via INTRAVENOUS

## 2019-10-02 MED ORDER — PROPOFOL 500 MG/50ML IV EMUL
INTRAVENOUS | Status: DC | PRN
  Administered 2019-10-02: 50 ug/kg/min via INTRAVENOUS

## 2019-10-02 MED ORDER — EPHEDRINE SULFATE 50 MG/ML IJ SOLN
INTRAMUSCULAR | Status: DC | PRN
  Administered 2019-10-02 (×2): 5 mg via INTRAVENOUS

## 2019-10-02 MED ORDER — FENTANYL CITRATE (PF) 100 MCG/2ML IJ SOLN: 25.0000 ug | INTRAMUSCULAR | Status: DC | PRN

## 2019-10-02 MED ORDER — CEFAZOLIN SODIUM-DEXTROSE 2-4 GM/100ML-% IV SOLN
INTRAVENOUS | Status: AC
  Filled 2019-10-02: qty 100

## 2019-10-02 MED ORDER — APIXABAN 5 MG PO TABS
5.0000 mg | ORAL_TABLET | Freq: Two times a day (BID) | ORAL | Status: DC
  Administered 2019-10-02 – 2019-10-05 (×7): 5 mg via ORAL
  Filled 2019-10-02 (×6): qty 1

## 2019-10-02 MED ORDER — SODIUM CHLORIDE 0.9 % IV SOLN: INTRAVENOUS | Status: DC

## 2019-10-02 MED ORDER — SODIUM CHLORIDE 0.9 % IV SOLN
INTRAVENOUS | Status: DC | PRN
  Administered 2019-10-02: 20 ug/min via INTRAVENOUS

## 2019-10-02 MED ORDER — DEXAMETHASONE SODIUM PHOSPHATE 10 MG/ML IJ SOLN
INTRAMUSCULAR | Status: DC | PRN
  Administered 2019-10-02: 10 mg via INTRAVENOUS

## 2019-10-02 MED ORDER — LACTATED RINGERS IV SOLN
INTRAVENOUS | Status: DC | PRN
  Administered 2019-10-02: 10:00:00 via INTRAVENOUS

## 2019-10-02 MED ORDER — SODIUM CHLORIDE 0.9 % IV SOLN
INTRAVENOUS | Status: DC | PRN
  Administered 2019-10-02: 250 mL

## 2019-10-02 MED ORDER — GENTAMICIN SULFATE 40 MG/ML IJ SOLN
INTRAMUSCULAR | Status: AC
  Filled 2019-10-02: qty 2

## 2019-10-02 MED ORDER — PHENYLEPHRINE HCL (PRESSORS) 10 MG/ML IV SOLN
INTRAVENOUS | Status: DC | PRN
  Administered 2019-10-02 (×2): 100 ug via INTRAVENOUS

## 2019-10-02 MED ORDER — METOPROLOL TARTRATE 5 MG/5ML IV SOLN
INTRAVENOUS | Status: DC | PRN
  Administered 2019-10-02: 2 mg via INTRAVENOUS

## 2019-10-02 MED ORDER — SODIUM CHLORIDE 0.9 % IV SOLN
80.0000 mg | INTRAVENOUS | Status: DC
  Administered 2019-10-02: 80 mg
  Filled 2019-10-02: qty 80

## 2019-10-02 SURGICAL SUPPLY — 23 items
BAG DECANTER FOR FLEXI CONT (MISCELLANEOUS) ×3 IMPLANT
BLADE PHOTON ILLUMINATED (MISCELLANEOUS) ×3 IMPLANT
CANISTER SUCT 1200ML W/VALVE (MISCELLANEOUS) ×3 IMPLANT
CHLORAPREP W/TINT 26 (MISCELLANEOUS) ×3 IMPLANT
COVER LIGHT HANDLE STERIS (MISCELLANEOUS) ×6 IMPLANT
COVER MAYO STAND REUSABLE (DRAPES) ×3 IMPLANT
COVER WAND RF STERILE (DRAPES) ×3 IMPLANT
DRSG TEGADERM 4X4.75 (GAUZE/BANDAGES/DRESSINGS) ×3 IMPLANT
DRSG TELFA 4X3 1S NADH ST (GAUZE/BANDAGES/DRESSINGS) ×3 IMPLANT
ELECT REM PT RETURN 9FT ADLT (ELECTROSURGICAL) ×3
ELECTRODE REM PT RTRN 9FT ADLT (ELECTROSURGICAL) ×1 IMPLANT
GLOVE BIO SURGEON STRL SZ7.5 (GLOVE) ×3 IMPLANT
GLOVE BIO SURGEON STRL SZ8 (GLOVE) ×3 IMPLANT
GOWN STRL REUS W/ TWL LRG LVL3 (GOWN DISPOSABLE) ×1 IMPLANT
GOWN STRL REUS W/ TWL XL LVL3 (GOWN DISPOSABLE) ×1 IMPLANT
GOWN STRL REUS W/TWL LRG LVL3 (GOWN DISPOSABLE) ×2
GOWN STRL REUS W/TWL XL LVL3 (GOWN DISPOSABLE) ×2
ICD CLARIA MRI DTMA1D1 (ICD Generator) ×2 IMPLANT
KIT TURNOVER KIT A (KITS) ×3 IMPLANT
MARKER SKIN DUAL TIP RULER LAB (MISCELLANEOUS) ×3 IMPLANT
PACK PACE INSERTION (MISCELLANEOUS) ×3 IMPLANT
PAD STATPAD (MISCELLANEOUS) ×3 IMPLANT
STRAP SAFETY 5IN WIDE (MISCELLANEOUS) ×3 IMPLANT

## 2019-10-02 NOTE — Progress Notes (Signed)
Recent ICD interrogation revealed BiV ICD is at elective replacement indication.  We will proceed with ICD generator change around.

## 2019-10-02 NOTE — Transfer of Care (Signed)
Immediate Anesthesia Transfer of Care Note  Patient: Rick Gilmore  Procedure(s) Performed: ICD GENERATOR CHANGE (Left )  Patient Location: PACU  Anesthesia Type:MAC  Level of Consciousness: awake, alert , oriented and patient cooperative  Airway & Oxygen Therapy: Patient Spontanous Breathing and Patient connected to face mask oxygen  Post-op Assessment: Report given to RN and Post -op Vital signs reviewed and stable  Post vital signs: Reviewed and stable  Last Vitals:  Vitals Value Taken Time  BP 105/70 10/02/19 1145  Temp    Pulse    Resp 26 10/02/19 1146  SpO2    Vitals shown include unvalidated device data.  Last Pain:  Vitals:   10/02/19 0919  TempSrc: Oral  PainSc: 0-No pain         Complications: No apparent anesthesia complications

## 2019-10-02 NOTE — Anesthesia Post-op Follow-up Note (Signed)
Anesthesia QCDR form completed.        

## 2019-10-02 NOTE — Op Note (Signed)
[facility] Card[facility]   09/30/2019 - 10/02/2019                     11:28 AM  PATIENT:  Rick Gilmore    PRE-OPERATIVE DIAGNOSIS:  battery ERI  POST-OPERATIVE DIAGNOSIS:  Same  PROCEDURE:  ICD GENERATOR CHANGE  SURGEON:  Isaias Cowman, MD    ANESTHESIA:     PREOPERATIVE INDICATIONS:  Kendrell Lottman is a  78 y.o. male with a diagnosis of battery ERI who failed conservative measures and elected for surgical management.    The risks benefits and alternatives were discussed with the patient preoperatively including but not limited to the risks of infection, bleeding, cardiopulmonary complications, the need for revision surgery, among others, and the patient was willing to proceed.   OPERATIVE PROCEDURE: The left pectoral region was prepped and draped in usual sterile manner.  Anesthesia was obtained 1% lidocaine locally.  A 6 cm incision was performed.  The ICD generator was retrieved by electrocautery and blunt dissection.  The leads were disconnected and connected to a new BiV ICD generator.  The device pocket was irrigated with gentamicin solution.  The new ICD generator was positioned into the pocket and the pocket was closed with 2-0 and 4-0 Vicryl, respectively.  Steri-Strips and a pressure dressing were applied.  Postprocedural interrogation revealed appropriate atrial, right ventricular, and left ventricular sensing and pacing thresholds.  There were no periprocedural complications.   ICD Criteria  Current LVEF:25-30%. Within 12 months prior to implant: Yes   Heart failure history: Yes, Class III  Cardiomyopathy history: Yes, Ischemic Cardiomyopathy - Prior MI.  Atrial Fibrillation/Atrial Flutter: Yes, Paroxysmal.  Ventricular tachycardia history: No.  Cardiac arrest history: No.  History of syndromes with risk of sudden death: No.  Previous ICD: Yes, Reason for ICD:  Primary  prevention.  Current ICD indication: Primary  PPM indication: Yes. Pacing type: Both. Greater than 40% RV pacing requirement anticipated. Indication: Sick Sinus Syndrome   Class I or II Bradycardia indication present: No  Beta Blocker therapy for 3 or more months: Yes, prescribed.   Ace Inhibitor/ARB therapy for 3 or more months: Yes, prescribed.

## 2019-10-02 NOTE — Progress Notes (Signed)
15 minute call to floor. 

## 2019-10-02 NOTE — Anesthesia Preprocedure Evaluation (Addendum)
Anesthesia Evaluation  Patient identified by MRN, date of birth, ID band Patient awake    Reviewed: Allergy & Precautions, H&P , NPO status , Patient's Chart, lab work & pertinent test results  Airway Mallampati: II  TM Distance: >3 FB Neck ROM: full    Dental  (+) Chipped   Pulmonary sleep apnea , COPD,  Recurrent pleural effusions secondary to CHF          Cardiovascular hypertension, + CAD, + Past MI and +CHF (Ischemic cardiomyopathy.  Acute on chronic heart failure. Last estimate of EF 30-35%)  + dysrhythmias Atrial Fibrillation and Ventricular Tachycardia + pacemaker + Cardiac Defibrillator      Neuro/Psych negative neurological ROS  negative psych ROS   GI/Hepatic Neg liver ROS, GERD  Controlled,  Endo/Other  negative endocrine ROS  Renal/GU CRFRenal disease  negative genitourinary   Musculoskeletal   Abdominal   Peds  Hematology  (+) Blood dyscrasia, anemia , thrombocytopenia   Anesthesia Other Findings Past Medical History: No date: AICD (automatic cardioverter/defibrillator) present No date: Anemia No date: Arrhythmia     Comment:  a fib No date: Atrial fibrillation (HCC)     Comment:  on Coumadin No date: CAD (coronary artery disease) 2001: Cancer ([place])     Comment:  Prostate, XRT and implant No date: CHF (congestive heart failure) (HCC) No date: COPD (chronic obstructive pulmonary disease) (HCC) No date: Erectile dysfunction No date: GERD (gastroesophageal reflux disease) No date: Hyperlipidemia No date: Hypertension No date: Ischemic cardiomyopathy     Comment:  s/p AICD/pacer 2009, replaced 2010 [facility] 1988: MI (myocardial infarction) ([place]) 2001: Personal history of prostate cancer     Comment:  s/p XRT and implant ([place]) No date: Prostate cancer ([place]) No date: Sleep apnea, obstructive     Comment:  uses CPAP nightly No date: Thrombocytopenia ([place]) No date: Tubular adenoma     Comment:   colon polyp No date: Vitamin B 12 deficiency  Past Surgical History: No date: CARDIAC CATHETERIZATION 05/30/2015: COLONOSCOPY WITH PROPOFOL; [name]     Comment:  Procedure: COLONOSCOPY WITH PROPOFOL;  Surgeon: Manya Silvas, MD;  Location: [facility] ENDOSCOPY;  Service:               Endoscopy;  Laterality: [name]; 07/16/2019: COLONOSCOPY WITH PROPOFOL; [name]     Comment:  Procedure: COLONOSCOPY WITH PROPOFOL;  Surgeon: [place],               Benay Pike, MD;  Location: [facility] ENDOSCOPY;  Service:               Gastroenterology;  Laterality: [name]; 1988: CORONARY ARTERY BYPASS GRAFT 05/30/2015: ESOPHAGOGASTRODUODENOSCOPY; [name]     Comment:  Procedure: ESOPHAGOGASTRODUODENOSCOPY (EGD);  Surgeon:               Manya Silvas, MD;  Location: [facility] ENDOSCOPY;                Service: Endoscopy;  Laterality: [name]; 07/16/2019: ESOPHAGOGASTRODUODENOSCOPY (EGD) WITH PROPOFOL; [name]     Comment:  Procedure: ESOPHAGOGASTRODUODENOSCOPY (EGD) WITH               PROPOFOL;  Surgeon: Tol[place], Benay Pike, MD;  Location:               [facility] ENDOSCOPY;  Service: Gastroenterology;  Laterality:               [name]; April 2014: IMPLANTABLE CARDIOVERTER  DEFIBRILLATOR GENERATOR CHANGE     Comment:  Bi V RRR 2014: INSERT / REPLACE / REMOVE PACEMAKER No date: VASECTOMY  BMI    Body Mass Index: 30.72 kg/m      Reproductive/Obstetrics negative OB ROS                            Anesthesia Physical Anesthesia Plan  ASA: IV  Anesthesia Plan: General   Post-op Pain Management:    Induction:   PONV Risk Score and Plan: Propofol infusion and TIVA  Airway Management Planned: Natural Airway and Simple Face Mask  Additional Equipment:   Intra-op Plan:   Post-operative Plan:   Informed Consent: I have reviewed the patients History and Physical, chart, labs and discussed the procedure including the risks, benefits and alternatives for the proposed anesthesia with the patient or authorized  representative who has indicated his/her understanding and acceptance.     Dental Advisory Given  Plan Discussed with: Anesthesiologist, CRNA and Surgeon  Anesthesia Plan Comments:         Anesthesia Quick Evaluation

## 2019-10-02 NOTE — Progress Notes (Signed)
[place] at [place] NAME: Rick Gil[place]    MR#:  [id number]  DATE OF BIRTH:  May 03, 1941  SUBJECTIVE:  CHIEF COMPLAINT: Patient is seen and examined after AICD battery replacement today, resting comfortably.  Tolerated procedure well shortness of breath is better.  Daughter at bedside.  REVIEW OF SYSTEMS:  CONSTITUTIONAL: No fever, fatigue or weakness.  EYES: No blurred or double vision.  EARS, NOSE, AND THROAT: No tinnitus or ear pain.  RESPIRATORY: No cough, improving shortness of breath, denies wheezing or hemoptysis.  CARDIOVASCULAR: No chest pain, orthopnea, edema.  GASTROINTESTINAL: No nausea, vomiting, diarrhea or abdominal pain.  GENITOURINARY: No dysuria, hematuria.  ENDOCRINE: No polyuria, nocturia,  HEMATOLOGY: No anemia, easy bruising or bleeding SKIN: No rash or lesion. MUSCULOSKELETAL: No joint pain or arthritis.   NEUROLOGIC: No tingling, numbness, weakness.  PSYCHIATRY: No anxiety or depression.   DRUG ALLERGIES:  No Known Allergies  VITALS:  Blood pressure 126/63, pulse 74, temperature 98.6 F (37 C), temperature source Oral, resp. rate 17, height 6\' 2"  (1.88 m), weight 108.5 kg, SpO2 93 %.  PHYSICAL EXAMINATION:  GENERAL:  78 y.o.-year-old patient lying in the bed with no acute distress.  EYES: Pupils equal, round, reactive to light and accommodation. No scleral icterus. Extraocular muscles intact.  HEENT: Head atraumatic, normocephalic. Oropharynx and nasopharynx clear.  NECK:  Supple, no jugular venous distention. No thyroid enlargement, no tenderness.  LUNGS: Moderate breath sounds bilaterally, no wheezing, few rales,rhonchi, no crepitation. No use of accessory muscles of respiration.  CARDIOVASCULAR: S1, S2 normal. No murmurs, rubs, or gallops.  ABDOMEN: Soft, nontender, nondistended. Bowel sounds present.  EXTREMITIES: No pedal edema, cyanosis, or clubbing.  NEUROLOGIC: Cranial nerves II through XII are  intact. Muscle strength 5/5 in all extremities. Sensation intact. Gait not checked.  PSYCHIATRIC: The patient is alert and oriented x 3.  SKIN: No obvious rash, lesion, or ulcer.    LABORATORY PANEL:   CBC Recent Labs  Lab 10/02/19 0528  WBC 5.4  HGB 9.2*  HCT 28.8*  PLT 76*   ------------------------------------------------------------------------------------------------------------------  Chemistries  Recent Labs  Lab 09/30/19 1719  10/02/19 0528  NA 135   < > 133*  K 5.6*   < > 4.0  CL 102   < > 101  CO2 22   < > 24  GLUCOSE 117*   < > 111*  BUN 35*   < > 28*  CREATININE 2.35*   < > 1.90*  CALCIUM 9.4   < > 8.9  AST 17  --   --   ALT 12  --   --   ALKPHOS 59  --   --   BILITOT 1.5*  --   --    < > = values in this interval not displayed.   ------------------------------------------------------------------------------------------------------------------  Cardiac Enzymes No results for input(s): TROPONINI in the last 168 hours. ------------------------------------------------------------------------------------------------------------------  RADIOLOGY:  Dg Chest Portable 1 View  Result Date: 09/30/2019 CLINICAL DATA:  Shortness of breath EXAM: PORTABLE CHEST 1 VIEW COMPARISON:  09/12/2019 FINDINGS: Interval increase in a right-sided pleural effusion, small to moderate, with associated atelectasis or consolidation. Gross cardiomegaly with left chest multi lead pacer defibrillator status post median sternotomy. IMPRESSION: 1. Interval increase in a right-sided pleural effusion, small to moderate, with associated atelectasis or consolidation. 2. Gross cardiomegaly with left chest multi lead pacer defibrillator status post median sternotomy. Electronically Signed   By: Eddie Candle M.D.   On: 09/30/2019  18:03    EKG:   Orders placed or performed during the hospital encounter of 09/30/19  . ED EKG  . ED EKG    ASSESSMENT AND PLAN:    This is a 78 year old male  admitted for CHF exacerbation.  With ischemic cardiomyopathy 1.  CHF: Acute on chronic; systolic.    With mild to moderate right-sided pleural effusion EF per echo 3 years ago 30 to 35%.  Continue diuresis with IV Lasix.  Monitor renal function closely    Reassess tomorrow morning , may resume oral torsemide clinically stable.   Repeat echocardiogram at 25 to 30% ejection fraction and ischemic cardiomyopathy Cardiology Dr. Montine Circle has seen the patient and okay to discharge if patient is ambulating fine in a.m. Monitor daily weights, intake and output Repeat chest xray revealing interval increase in the right-sided pleural effusion small to moderate in size with associated atelectasis Incentive spirometry  2.  Atypical chest pain: Troponin elevated but stable.  Demand ischemia The patient has a pacemaker/AICD that was due to be changed in 3 days.  The pacemaker AICD battery will be changed today   Monitor telemetry.  Continue Imdur.  Outpatient follow-up with cardiology 3.  Hypertension: Controlled; continue metoprolol and spironolactone. 4.  CKD:  Stage IV; monitor renal function.  Baseline creatinine seems to be at around 1.9 and patient is at his baseline we will continue close monitoring 5.  Atrial fibrillation: history of V. tach in the past :rate controlled; continue amiodarone.  Continue Eliquis for anticoagulation 6.  Gout: Stable; continue renally adjusted allopurinol.  Colchicine as needed 7.  Hyperlipidemia: Continue statin therapy 8.  COPD: Stable; continue Anoro Ellipta.  Albuterol as needed   .  DVT prophylaxis: Therapeutic anticoagulation   GI prophylaxis: PPI per home regimen    All the records are reviewed and case discussed with Care Management/Social Workerr. Management plans discussed with the patient, daughter at bed side and they are in agreement.  CODE STATUS:   TOTAL TIME TAKING CARE OF THIS PATIENT: 36  minutes.   POSSIBLE D/C IN  1-2  DAYS, DEPENDING ON CLINICAL  CONDITION.  Note: This dictation was prepared with Dragon dictation along with smaller phrase technology. Any transcriptional errors that result from this process are unintentional.   Nicholes Mango M.D on 10/02/2019 at 4:48 PM  Between 7am to 6pm - Pager - [phone] After 6pm go to [email or web address] - password [email or web address] [facility]  Office  [phone]  CC: Primary care physician; Crecencio Mc, MD

## 2019-10-02 NOTE — Progress Notes (Signed)
[facility] Encounter Note  Patient: Rick Gilmore / Admit Date: 09/30/2019 / Date of Encounter: 10/02/2019, 8:33 AM   Subjective: No significant issues or concerns from the cardiovascular standpoint overnight.  Patient has dramatically had improvements of his shortness of breath with intravenous Lasix yesterday.  Patient does have slight pleural effusion which does not need thoracentesis at this time.  There is been no other significant rhythm disturbances requiring further intervention.  Echocardiogram showing continued moderate LV systolic dysfunction with ejection fraction of 30% and moderate to severe mitral regurgitation as before.  Patient has had defibrillator battery at end-of-life and needing change out for which she is at low risk at this time and will proceed  Review of Systems: Positive for: Shortness of breath Negative for: Vision change, hearing change, syncope, dizziness, nausea, vomiting,diarrhea, bloody stool, stomach pain, cough, congestion, diaphoresis, urinary frequency, urinary pain,skin lesions, skin rashes Others previously listed  Objective:  Physical Exam: Blood pressure (!) 96/53, pulse 67, temperature 98 F (36.7 C), temperature source Oral, resp. rate 16, height 6\' 2"  (1.88 m), weight 108.5 kg, SpO2 96 %. Body mass index is 30.72 kg/m. General: Well developed, well nourished, in no acute distress. Head: Normocephalic, atraumatic, sclera non-icteric, no xanthomas, nares are without discharge. Neck: No apparent masses Lungs: Normal respirations with no wheezes, no rhonchi, no rales , few crackles decreased breath sounds basilar right  Heart: Regular rate and rhythm, normal S1 S2, no murmur, no rub, no gallop, PMI is normal size and placement, carotid upstroke normal without bruit, jugular venous pressure normal Abdomen: Soft, non-tender, non-distended with normoactive bowel sounds. No hepatosplenomegaly. Abdominal aorta is normal size without  bruit Extremities: Trace edema, no clubbing, no cyanosis, no ulcers,  Peripheral: 2+ radial, 2+ femoral, 2+ dorsal pedal pulses Neuro: Alert and oriented. Moves all extremities spontaneously. Psych:  Responds to questions appropriately with a normal affect.   Intake/Output Summary (Last 24 hours) at 10/02/2019 8676 Last data filed at 10/02/2019 0121 Gross per 24 hour  Intake -  Output 350 ml  Net -350 ml    Inpatient Medications:  . docusate sodium  100 mg Oral BID  . furosemide  40 mg Intravenous BID  . heparin  5,000 Units Subcutaneous Q8H   Infusions:   Labs: Recent Labs    10/01/19 1323 10/02/19 0528  NA 133* 133*  K 4.3 4.0  CL 100 101  CO2 21* 24  GLUCOSE 129* 111*  BUN 31* 28*  CREATININE 2.19* 1.90*  CALCIUM 9.1 8.9   Recent Labs    09/30/19 1719  AST 17  ALT 12  ALKPHOS 59  BILITOT 1.5*  PROT 7.2  ALBUMIN 4.2   Recent Labs    09/30/19 1719 10/01/19 1323 10/02/19 0528  WBC 12.5* 7.6 5.4  NEUTROABS 10.6*  --  4.3  HGB 10.4* 9.6* 9.2*  HCT 32.6* 30.1* 28.8*  MCV 96.7 96.5 95.7  PLT 137* 83* 76*   No results for input(s): CKTOTAL, CKMB, TROPONINI in the last 72 hours. Invalid input(s): POCBNP No results for input(s): HGBA1C in the last 72 hours.   Weights: Filed Weights   09/30/19 1712 09/30/19 2201 10/02/19 0408  Weight: 113 kg 110.5 kg 108.5 kg     Radiology/Studies:  Dg Chest 1 View  Result Date: 09/10/2019 CLINICAL DATA:  Status post right thoracentesis EXAM: CHEST  1 VIEW COMPARISON:  09/10/2019 FINDINGS: Interval right thoracentesis has been performed with resolution of the previously seen right-sided pleural effusion. No pneumothorax is  noted. The left lung is clear. Defibrillator is again noted and stable. Postsurgical changes are seen and stable. Vascular congestion has improved from the earlier exam. IMPRESSION: No pneumothorax following right-sided thoracentesis. No residual fluid is seen. Previously seen vascular congestion has  improved in the interval. Electronically Signed   By: Inez Catalina M.D.   On: 09/10/2019 15:24   Dg Chest 2 View  Result Date: 09/12/2019 CLINICAL DATA:  Shortness of breath, history of right-sided thoracentesis 2 days ago EXAM: CHEST - 2 VIEW COMPARISON:  09/10/2019 FINDINGS: Cardiac shadow is enlarged. Defibrillator is again noted. The lungs are well aerated bilaterally without focal infiltrate. Small tiny pleural effusions are noted bilaterally. Some associated mild atelectatic changes are noted. No pneumothorax is seen. No bony abnormality is noted. IMPRESSION: Tiny bilateral pleural effusions. Mild bibasilar atelectasis. Electronically Signed   By: Inez Catalina M.D.   On: 09/12/2019 17:50   US Renal  Result Date: 09/09/2019 CLINICAL DATA:  Acute renal failure. Chronic kidney disease stage 3. EXAM: RENAL / URINARY TRACT ULTRASOUND COMPLETE COMPARISON:  06/13/2014 FINDINGS: Right Kidney: Renal measurements: 11.5 x 5.4 x 5.3 cm = volume: 171 mL . Echogenicity within normal limits. No mass or hydronephrosis visualized. Left Kidney: Renal measurements: 4.0 x 4.8 x 7.2 cm = volume: 216 mL. Echogenicity within normal limits. No mass or hydronephrosis visualized. Bladder: Empty. IMPRESSION: Normal size kidneys without hydronephrosis. Electronically Signed   By: Marin Olp M.D.   On: 09/09/2019 16:33   Dg Chest Portable 1 View  Result Date: 09/30/2019 CLINICAL DATA:  Shortness of breath EXAM: PORTABLE CHEST 1 VIEW COMPARISON:  09/12/2019 FINDINGS: Interval increase in a right-sided pleural effusion, small to moderate, with associated atelectasis or consolidation. Gross cardiomegaly with left chest multi lead pacer defibrillator status post median sternotomy. IMPRESSION: 1. Interval increase in a right-sided pleural effusion, small to moderate, with associated atelectasis or consolidation. 2. Gross cardiomegaly with left chest multi lead pacer defibrillator status post median sternotomy. Electronically  Signed   By: Eddie Candle M.D.   On: 09/30/2019 18:03   Dg Chest Port 1 View  Result Date: 09/10/2019 CLINICAL DATA:  Follow-up pleural effusion EXAM: PORTABLE CHEST 1 VIEW COMPARISON:  09/08/2019 FINDINGS: Cardiac shadow is again enlarged. Defibrillator is again noted and stable. Aortic calcifications are seen. Elevation of the right hemidiaphragm is noted consistent with volume loss. Right pleural effusion and basilar atelectasis is noted. Mild vascular congestion is again seen. IMPRESSION: Increasing opacity in the right lung base consistent with a combination of effusion and atelectasis. Vascular congestion is noted which is stable. Electronically Signed   By: Inez Catalina M.D.   On: 09/10/2019 07:53   Dg Chest Portable 1 View  Result Date: 09/08/2019 CLINICAL DATA:  Dyspnea with exertion. EXAM: PORTABLE CHEST 1 VIEW COMPARISON:  08/21/2019 FINDINGS: Stable enlarged cardiac silhouette, post CABG changes and left subclavian pacer and AICD leads. Stable prominence of the pulmonary vasculature and interstitial markings. Interval small to moderate-sized right pleural effusion. No acute bony abnormality. IMPRESSION: 1. Interval small to moderate-sized right pleural effusion. 2. Stable cardiomegaly, pulmonary vascular congestion and mild chronic interstitial lung disease. Electronically Signed   By: Claudie Revering M.D.   On: 09/08/2019 20:19   US Thoracentesis Asp Pleural Space W/img Guide  Result Date: 09/10/2019 INDICATION: Right-sided pleural effusion EXAM: ULTRASOUND GUIDED RIGHT THORACENTESIS MEDICATIONS: None. COMPLICATIONS: None immediate. PROCEDURE: An ultrasound guided thoracentesis was thoroughly discussed with the patient and questions answered. The benefits, risks, alternatives and complications were  also discussed. The patient understands and wishes to proceed with the procedure. Written consent was obtained. Ultrasound was performed to localize and mark an adequate pocket of fluid in the right  chest. The area was then prepped and draped in the normal sterile fashion. 1% Lidocaine was used for local anesthesia. Under ultrasound guidance a 6 Fr Safe-T-Centesis catheter was introduced. Thoracentesis was performed. The catheter was removed and a dressing applied. FINDINGS: A total of approximately 2.3 L of clear yellow fluid was removed. Samples were sent to the laboratory as requested by the clinical team. IMPRESSION: Successful ultrasound guided right thoracentesis yielding 2.3 L of pleural fluid. Electronically Signed   By: Inez Catalina M.D.   On: 09/10/2019 15:23     Assessment and Recommendation  78 y.o. 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  Defibrillator battery change out today due to low risk and need for further long-term treatment of above 2.  Continuation of furosemide but changing over to oral furosemide when able watching closely for worsening chronic kidney disease 3.  Continue low-sodium diet to reduce the possibility of further concerns of fluid retention and congestive heart failure 4.  Begin ambulation after above and follow for improvements of symptoms and possible discharge to home  Signed, Serafina Royals M.D. FACC

## 2019-10-03 ENCOUNTER — Encounter: Payer: Self-pay | Admitting: Cardiology

## 2019-10-03 ENCOUNTER — Inpatient Hospital Stay: Payer: Medicare Other

## 2019-10-03 LAB — BASIC METABOLIC PANEL
Anion gap: 10 (ref 5–15)
BUN: 28 mg/dL — ABNORMAL HIGH (ref 8–23)
CO2: 22 mmol/L (ref 22–32)
Calcium: 8.7 mg/dL — ABNORMAL LOW (ref 8.9–10.3)
Chloride: 98 mmol/L (ref 98–111)
Creatinine, Ser: 1.7 mg/dL — ABNORMAL HIGH (ref 0.61–1.24)
GFR calc Af Amer: 44 mL/min — ABNORMAL LOW (ref >60)
GFR calc non Af Amer: 38 mL/min — ABNORMAL LOW (ref >60)
Glucose, Bld: 122 mg/dL — ABNORMAL HIGH (ref 70–99)
Potassium: 3.9 mmol/L (ref 3.5–5.1)
Sodium: 130 mmol/L — ABNORMAL LOW (ref 135–145)

## 2019-10-03 MED ORDER — PRAVASTATIN SODIUM 40 MG PO TABS
80.0000 mg | ORAL_TABLET | Freq: Every day | ORAL | Status: DC
  Administered 2019-10-03 – 2019-10-04 (×2): 80 mg via ORAL
  Filled 2019-10-03 (×2): qty 2

## 2019-10-03 MED ORDER — AMIODARONE HCL 200 MG PO TABS
200.0000 mg | ORAL_TABLET | Freq: Every day | ORAL | Status: DC
  Administered 2019-10-04 – 2019-10-05 (×2): 200 mg via ORAL
  Filled 2019-10-03 (×2): qty 1

## 2019-10-03 MED ORDER — MONTELUKAST SODIUM 10 MG PO TABS
10.0000 mg | ORAL_TABLET | Freq: Every day | ORAL | Status: DC
  Administered 2019-10-03 – 2019-10-04 (×2): 10 mg via ORAL
  Filled 2019-10-03 (×2): qty 1

## 2019-10-03 MED ORDER — ESCITALOPRAM OXALATE 10 MG PO TABS
10.0000 mg | ORAL_TABLET | Freq: Every day | ORAL | Status: DC
  Administered 2019-10-04: 10 mg via ORAL
  Filled 2019-10-03 (×2): qty 1

## 2019-10-03 MED ORDER — UMECLIDINIUM-VILANTEROL 62.5-25 MCG/INH IN AEPB
1.0000 | INHALATION_SPRAY | Freq: Every day | RESPIRATORY_TRACT | Status: DC
  Administered 2019-10-04 – 2019-10-05 (×2): 1 via RESPIRATORY_TRACT
  Filled 2019-10-03: qty 14

## 2019-10-03 NOTE — Progress Notes (Signed)
[facility] Encounter Note  Patient: Rick Gilmore / Admit Date: 09/30/2019 / Date of Encounter: 10/03/2019, 8:47 AM   Subjective: No significant issues or concerns from the cardiovascular standpoint overnight.  Patient has dramatically had improvements of his shortness of breath with intravenous Lasix since admission.  Patient does have slight pleural effusion which does not need thoracentesis at this time.  There is been no other significant rhythm disturbances requiring further intervention.  Echocardiogram showing continued moderate LV systolic dysfunction with ejection fraction of 30% and moderate to severe mitral regurgitation as before.  Patient has had defibrillator battery at end-of-life and needing change out for which was performed yesterday without complication Review of Systems: Positive for: Shortness of breath Negative for: Vision change, hearing change, syncope, dizziness, nausea, vomiting,diarrhea, bloody stool, stomach pain, cough, congestion, diaphoresis, urinary frequency, urinary pain,skin lesions, skin rashes Others previously listed  Objective:  Physical Exam: Blood pressure (!) 103/55, pulse 69, temperature 97.8 F (36.6 C), temperature source Oral, resp. rate 19, height 6\' 2"  (1.88 m), weight 108.5 kg, SpO2 93 %. Body mass index is 30.7 kg/m. General: Well developed, well nourished, in no acute distress. Head: Normocephalic, atraumatic, sclera non-icteric, no xanthomas, nares are without discharge. Neck: No apparent masses Lungs: Normal respirations with no wheezes, no rhonchi, no rales , few crackles decreased breath sounds basilar right  Heart: Regular rate and rhythm, normal S1 S2, no murmur, no rub, no gallop, PMI is normal size and placement, carotid upstroke normal without bruit, jugular venous pressure normal Abdomen: Soft, non-tender, non-distended with normoactive bowel sounds. No hepatosplenomegaly. Abdominal aorta is normal size  without bruit Extremities: Trace edema, no clubbing, no cyanosis, no ulcers,  Peripheral: 2+ radial, 2+ femoral, 2+ dorsal pedal pulses Neuro: Alert and oriented. Moves all extremities spontaneously. Psych:  Responds to questions appropriately with a normal affect.   Intake/Output Summary (Last 24 hours) at 10/03/2019 0847 Last data filed at 10/03/2019 1610 Gross per 24 hour  Intake 200 ml  Output 1075 ml  Net -875 ml    Inpatient Medications:  . apixaban  5 mg Oral BID  . docusate sodium  100 mg Oral BID  . furosemide  40 mg Intravenous BID   Infusions:   Labs: Recent Labs    10/02/19 0528 10/03/19 0308  NA 133* 130*  K 4.0 3.9  CL 101 98  CO2 24 22  GLUCOSE 111* 122*  BUN 28* 28*  CREATININE 1.90* 1.70*  CALCIUM 8.9 8.7*   Recent Labs    09/30/19 1719  AST 17  ALT 12  ALKPHOS 59  BILITOT 1.5*  PROT 7.2  ALBUMIN 4.2   Recent Labs    09/30/19 1719 10/01/19 1323 10/02/19 0528  WBC 12.5* 7.6 5.4  NEUTROABS 10.6*  --  4.3  HGB 10.4* 9.6* 9.2*  HCT 32.6* 30.1* 28.8*  MCV 96.7 96.5 95.7  PLT 137* 83* 76*   No results for input(s): CKTOTAL, CKMB, TROPONINI in the last 72 hours. Invalid input(s): POCBNP No results for input(s): HGBA1C in the last 72 hours.   Weights: Filed Weights   10/02/19 0408 10/02/19 0919 10/03/19 0432  Weight: 108.5 kg 108.5 kg 108.5 kg     Radiology/Studies:  Dg Chest 1 View  Result Date: 09/10/2019 CLINICAL DATA:  Status post right thoracentesis EXAM: CHEST  1 VIEW COMPARISON:  09/10/2019 FINDINGS: Interval right thoracentesis has been performed with resolution of the previously seen right-sided pleural effusion. No pneumothorax is noted. The left lung is  clear. Defibrillator is again noted and stable. Postsurgical changes are seen and stable. Vascular congestion has improved from the earlier exam. IMPRESSION: No pneumothorax following right-sided thoracentesis. No residual fluid is seen. Previously seen vascular congestion has  improved in the interval. Electronically Signed   By: Inez Catalina M.D.   On: 09/10/2019 15:24   Dg Chest 2 View  Result Date: 09/12/2019 CLINICAL DATA:  Shortness of breath, history of right-sided thoracentesis 2 days ago EXAM: CHEST - 2 VIEW COMPARISON:  09/10/2019 FINDINGS: Cardiac shadow is enlarged. Defibrillator is again noted. The lungs are well aerated bilaterally without focal infiltrate. Small tiny pleural effusions are noted bilaterally. Some associated mild atelectatic changes are noted. No pneumothorax is seen. No bony abnormality is noted. IMPRESSION: Tiny bilateral pleural effusions. Mild bibasilar atelectasis. Electronically Signed   By: Inez Catalina M.D.   On: 09/12/2019 17:50   US Renal  Result Date: 09/09/2019 CLINICAL DATA:  Acute renal failure. Chronic kidney disease stage 3. EXAM: RENAL / URINARY TRACT ULTRASOUND COMPLETE COMPARISON:  06/13/2014 FINDINGS: Right Kidney: Renal measurements: 11.5 x 5.4 x 5.3 cm = volume: 171 mL . Echogenicity within normal limits. No mass or hydronephrosis visualized. Left Kidney: Renal measurements: 4.0 x 4.8 x 7.2 cm = volume: 216 mL. Echogenicity within normal limits. No mass or hydronephrosis visualized. Bladder: Empty. IMPRESSION: Normal size kidneys without hydronephrosis. Electronically Signed   By: Marin Olp M.D.   On: 09/09/2019 16:33   Dg Chest Portable 1 View  Result Date: 09/30/2019 CLINICAL DATA:  Shortness of breath EXAM: PORTABLE CHEST 1 VIEW COMPARISON:  09/12/2019 FINDINGS: Interval increase in a right-sided pleural effusion, small to moderate, with associated atelectasis or consolidation. Gross cardiomegaly with left chest multi lead pacer defibrillator status post median sternotomy. IMPRESSION: 1. Interval increase in a right-sided pleural effusion, small to moderate, with associated atelectasis or consolidation. 2. Gross cardiomegaly with left chest multi lead pacer defibrillator status post median sternotomy. Electronically  Signed   By: Eddie Candle M.D.   On: 09/30/2019 18:03   Dg Chest Port 1 View  Result Date: 09/10/2019 CLINICAL DATA:  Follow-up pleural effusion EXAM: PORTABLE CHEST 1 VIEW COMPARISON:  09/08/2019 FINDINGS: Cardiac shadow is again enlarged. Defibrillator is again noted and stable. Aortic calcifications are seen. Elevation of the right hemidiaphragm is noted consistent with volume loss. Right pleural effusion and basilar atelectasis is noted. Mild vascular congestion is again seen. IMPRESSION: Increasing opacity in the right lung base consistent with a combination of effusion and atelectasis. Vascular congestion is noted which is stable. Electronically Signed   By: Inez Catalina M.D.   On: 09/10/2019 07:53   Dg Chest Portable 1 View  Result Date: 09/08/2019 CLINICAL DATA:  Dyspnea with exertion. EXAM: PORTABLE CHEST 1 VIEW COMPARISON:  08/21/2019 FINDINGS: Stable enlarged cardiac silhouette, post CABG changes and left subclavian pacer and AICD leads. Stable prominence of the pulmonary vasculature and interstitial markings. Interval small to moderate-sized right pleural effusion. No acute bony abnormality. IMPRESSION: 1. Interval small to moderate-sized right pleural effusion. 2. Stable cardiomegaly, pulmonary vascular congestion and mild chronic interstitial lung disease. Electronically Signed   By: Claudie Revering M.D.   On: 09/08/2019 20:19   US Thoracentesis Asp Pleural Space W/img Guide  Result Date: 09/10/2019 INDICATION: Right-sided pleural effusion EXAM: ULTRASOUND GUIDED RIGHT THORACENTESIS MEDICATIONS: None. COMPLICATIONS: None immediate. PROCEDURE: An ultrasound guided thoracentesis was thoroughly discussed with the patient and questions answered. The benefits, risks, alternatives and complications were also discussed. The patient understands  and wishes to proceed with the procedure. Written consent was obtained. Ultrasound was performed to localize and mark an adequate pocket of fluid in the right  chest. The area was then prepped and draped in the normal sterile fashion. 1% Lidocaine was used for local anesthesia. Under ultrasound guidance a 6 Fr Safe-T-Centesis catheter was introduced. Thoracentesis was performed. The catheter was removed and a dressing applied. FINDINGS: A total of approximately 2.3 L of clear yellow fluid was removed. Samples were sent to the laboratory as requested by the clinical team. IMPRESSION: Successful ultrasound guided right thoracentesis yielding 2.3 L of pleural fluid. Electronically Signed   By: Inez Catalina M.D.   On: 09/10/2019 15:23     Assessment and Recommendation  78 y.o. male with known acute on chronic systolic dysfunction congestive heart failure with mitral regurgitation multifactorial in nature including chronic kidney disease stage III anemia with controlled atrial fibrillation hypertension and cardiomyopathy and improvements of symptoms without evidence of myocardial infarction status post defibrillator battery change for which has been performed without complication now hemodynamically stable and having maximal hospital benefit 1.  No further cardiac intervention or diagnostic testing necessary at this time 2.  Continuation of furosemide but changing over to oral furosemide and/or torsemide when able watching closely for worsening chronic kidney disease 3.  Continue low-sodium diet to reduce the possibility of further concerns of fluid retention and congestive heart failure 4.  Begin ambulation after above and follow for improvements of symptoms and possible discharge to home with follow-up next week  Signed, Serafina Royals M.D. FACC

## 2019-10-03 NOTE — Care Management Important Message (Signed)
Important Message  Patient Details  Name: Rick Gilmore MRN: [id number] Date of Birth: 11/21/1941   Medicare Important Message Given:  Yes     Juliann Pulse A [name] 10/03/2019, 10:52 AM

## 2019-10-03 NOTE — Progress Notes (Signed)
[place] [facility] [place] NAME: Rick Gilmore    MR#:  [id number]  DATE OF BIRTH:  [birth year]  SUBJECTIVE:  Cs/p AICD battery replacement, seems SOB REVIEW OF SYSTEMS:  CONSTITUTIONAL: No fever, fatigue or weakness.  EYES: No blurred or double vision.  EARS, NOSE, AND THROAT: No tinnitus or ear pain.  RESPIRATORY: No cough, improving shortness of breath, denies wheezing or hemoptysis.  CARDIOVASCULAR: No chest pain, orthopnea, edema.  GASTROINTESTINAL: No nausea, vomiting, diarrhea or abdominal pain.  GENITOURINARY: No dysuria, hematuria.  ENDOCRINE: No polyuria, nocturia,  HEMATOLOGY: No anemia, easy bruising or bleeding SKIN: No rash or lesion. MUSCULOSKELETAL: No joint pain or arthritis.   NEUROLOGIC: No tingling, numbness, weakness.  PSYCHIATRY: No anxiety or depression.   DRUG ALLERGIES:  No Known Allergies  VITALS:  Blood pressure (!) 111/54, pulse 70, temperature 98.2 F (36.8 C), temperature source Oral, resp. rate 19, height 6\' 2"  (1.88 m), weight 108.5 kg, SpO2 93 %.  PHYSICAL EXAMINATION:  GENERAL:  78 y.o.-year-old patient lying in the bed with no acute distress.  EYES: Pupils equal, round, reactive to light and accommodation. No scleral icterus. Extraocular muscles intact.  HEENT: Head atraumatic, normocephalic. Oropharynx and nasopharynx clear.  NECK:  Supple, no jugular venous distention. No thyroid enlargement, no tenderness.  LUNGS: Moderate breath sounds bilaterally, no wheezing, few rales,rhonchi, no crepitation. No use of accessory muscles of respiration.  CARDIOVASCULAR: S1, S2 normal. No murmurs, rubs, or gallops.  ABDOMEN: Soft, nontender, nondistended. Bowel sounds present.  EXTREMITIES: No pedal edema, cyanosis, or clubbing.  NEUROLOGIC: Cranial nerves II through XII are intact. Muscle strength 5/5 in all extremities. Sensation intact. Gait not checked.  PSYCHIATRIC: The patient is alert and oriented x 3.   SKIN: No obvious rash, lesion, or ulcer.    LABORATORY PANEL:   CBC Recent Labs  Lab 10/02/19 0528  WBC 5.4  HGB 9.2*  HCT 28.8*  PLT 76*   ------------------------------------------------------------------------------------------------------------------  Chemistries  Recent Labs  Lab 09/30/19 1719  10/03/19 0308  NA 135   < > 130*  K 5.6*   < > 3.9  CL 102   < > 98  CO2 22   < > 22  GLUCOSE 117*   < > 122*  BUN 35*   < > 28*  CREATININE 2.35*   < > 1.70*  CALCIUM 9.4   < > 8.7*  AST 17  --   --   ALT 12  --   --   ALKPHOS 59  --   --   BILITOT 1.5*  --   --    < > = values in this interval not displayed.   ------------------------------------------------------------------------------------------------------------------  Cardiac Enzymes No results for input(s): TROPONINI in the last 168 hours. ------------------------------------------------------------------------------------------------------------------  RADIOLOGY:  Ct Chest Wo Contrast  Result Date: 10/03/2019 CLINICAL DATA:  Shortness of breath improved with Lasix. EXAM: CT CHEST WITHOUT CONTRAST TECHNIQUE: Multidetector CT imaging of the chest was performed following the standard protocol without IV contrast. COMPARISON:  10/07/2014 FINDINGS: Cardiovascular: Median sternotomy wires are present. Mild cardiomegaly. Cardiac pacer leads are present. Calcified plaque over the left main and 3 vessel coronary arteries unchanged. Calcified plaque over the thoracic aorta which is normal in caliber. Remaining vascular structures are unremarkable. Mediastinum/Nodes: 1 cm AP window lymph node likely reactive. Several other shotty mediastinal lymph nodes. No significant hilar adenopathy. Remaining mediastinal structures are unremarkable. Lungs/Pleura: Lungs are adequately inflated and demonstrate a moderate size  right pleural effusion and small left pleural effusion with associated atelectatic change. Mild patchy airspace  process over the superior segment left lower lobe and lingula which could represent early infection. Airways are within normal. Upper Abdomen: Cholelithiasis. Calcified plaque over the abdominal aorta. Mild nodular contour of the liver. Musculoskeletal: Degenerative change of the spine. IMPRESSION: 1. Moderate size right pleural effusion and small left pleural effusion with associated bibasilar atelectasis. Subtle hazy patchy airspace density over the left upper lobe and superior segment left lower lobe which could be due to concomitant infection. 1 cm AP window lymph node likely reactive. 2.  Cardiomegaly with atherosclerotic coronary artery disease. 3.  Aortic Atherosclerosis (ICD10-I70.0). 4.  Cholelithiasis. 5.  Nodular contour of the liver suggesting degree of cirrhosis. Electronically Signed   By: Marin Olp M.D.   On: 10/03/2019 13:39    EKG:   Orders placed or performed during the hospital encounter of 09/30/19  . ED EKG  . ED EKG    ASSESSMENT AND PLAN:  This is a 78 year old male admitted for CHF exacerbation.  With ischemic cardiomyopathy  1.  CHF: Acute on chronic; systolic.    With mild to moderate right-sided pleural effusion EF per echo 3 years ago 30 to 35%.  Continue diuresis with IV Lasix.  Monitor renal function closely    Reassess tomorrow morning , may resume oral torsemide clinically stable.   Repeat echocardiogram at 25 to 30% ejection fraction and ischemic cardiomyopathy Cardiology Dr. Montine Circle has seen the patient and okay to discharge if patient is ambulating fine in a.m. Monitor daily weights, intake and output Repeat chest xray revealing interval increase in the right-sided pleural effusion small to moderate in size with associated atelectasis Incentive spirometry - CT chest shows moderate size right pleural effusion and small left pleural effusion with associated bibasilar atelectasis. Subtle hazy patchy airspace density over the left upper lobe and superior segment  left lower lobe which could be due to concomitant infection.   2.  Atypical chest pain: Troponin elevated but stable.  Demand ischemia The patient has a pacemaker/AICD that was due to be changed in 3 days.  The pacemaker AICD battery will be changed today   Monitor telemetry.  Continue Imdur.  Outpatient follow-up with cardiology  3.  Hypertension: Controlled; continue metoprolol and spironolactone.  4.  CKD:  Stage IV; monitor renal function.  Baseline creatinine seems to be at around 1.9 and patient is at his baseline we will continue close monitoring 5.  Atrial fibrillation: history of V. tach in the past :rate controlled; continue amiodarone.  Continue Eliquis for anticoagulation 6.  Gout: Stable; continue renally adjusted allopurinol.  Colchicine as needed 7.  Hyperlipidemia: Continue statin therapy 8.  COPD: Stable; continue Anoro Ellipta.  Albuterol as needed   .  DVT prophylaxis: Therapeutic anticoagulation   GI prophylaxis: PPI per home regimen    All the records are reviewed and case discussed with Care Management/Social Workerr. Management plans discussed with the patient, daughter at bed side and they are in agreement.  CODE STATUS:   TOTAL TIME TAKING CARE OF THIS PATIENT: 35  minutes.   POSSIBLE D/C IN  1-2  DAYS, DEPENDING ON CLINICAL CONDITION.  Note: This dictation was prepared with Dragon dictation along with smaller phrase technology. Any transcriptional errors that result from this process are unintentional.   Max Sane M.D on 10/03/2019 at 6:05 PM  Between 7am to 6pm - Pager - [phone] After 6pm go to [email or web address] - password  [email or web address]  [facility] [place] [facility]  Office  [phone]  CC: Primary care physician; Crecencio Mc, MD

## 2019-10-03 NOTE — Anesthesia Postprocedure Evaluation (Signed)
Anesthesia Post Note  Patient: Rick Gilmore  Procedure(s) Performed: ICD GENERATOR CHANGE (Left )  Patient location during evaluation: PACU Anesthesia Type: General Level of consciousness: awake and alert Pain management: pain level controlled Vital Signs Assessment: post-procedure vital signs reviewed and stable Respiratory status: spontaneous breathing, nonlabored ventilation and respiratory function stable Cardiovascular status: blood pressure returned to baseline and stable Postop Assessment: no apparent nausea or vomiting Anesthetic complications: no     Last Vitals:  Vitals:   10/03/19 0432 10/03/19 0814  BP: (!) 111/58 (!) 103/55  Pulse: 71 69  Resp: 20 19  Temp: 37.1 C 36.6 C  SpO2: 93% 93%    Last Pain:  Vitals:   10/03/19 0814  TempSrc: Oral  PainSc:                  Durenda Hurt

## 2019-10-04 LAB — CBC
HCT: 29.2 % — ABNORMAL LOW (ref 39.0–52.0)
Hemoglobin: 9.4 g/dL — ABNORMAL LOW (ref 13.0–17.0)
MCH: 30.6 pg (ref 26.0–34.0)
MCHC: 32.2 g/dL (ref 30.0–36.0)
MCV: 95.1 fL (ref 80.0–100.0)
Platelets: 93 10*3/uL — ABNORMAL LOW (ref 150–400)
RBC: 3.07 MIL/uL — ABNORMAL LOW (ref 4.22–5.81)
RDW: 16 % — ABNORMAL HIGH (ref 11.5–15.5)
WBC: 7.5 10*3/uL (ref 4.0–10.5)
nRBC: 0 % (ref 0.0–0.2)

## 2019-10-04 LAB — BASIC METABOLIC PANEL
Anion gap: 10 (ref 5–15)
BUN: 25 mg/dL — ABNORMAL HIGH (ref 8–23)
CO2: 25 mmol/L (ref 22–32)
Calcium: 8.6 mg/dL — ABNORMAL LOW (ref 8.9–10.3)
Chloride: 98 mmol/L (ref 98–111)
Creatinine, Ser: 1.59 mg/dL — ABNORMAL HIGH (ref 0.61–1.24)
GFR calc Af Amer: 47 mL/min — ABNORMAL LOW (ref >60)
GFR calc non Af Amer: 41 mL/min — ABNORMAL LOW (ref >60)
Glucose, Bld: 110 mg/dL — ABNORMAL HIGH (ref 70–99)
Potassium: 3.7 mmol/L (ref 3.5–5.1)
Sodium: 133 mmol/L — ABNORMAL LOW (ref 135–145)

## 2019-10-04 NOTE — Evaluation (Signed)
Physical Therapy Evaluation Patient Details Name: Rick Gilmore MRN: [id number] DOB: 09-15-1941 Today's Date: 10/04/2019   History of Present Illness  78 year old male admitted with CHF exacerbation.  PMH includes MI, COPD, CHF and CA.  Clinical Impression  Pt was eager to get up and see how he would do with mobility.  Ultimately his biggest issue was O2, he was on 2.5 liters on arrival, trial on room air while gathering PLOF and home situation and he quickly dropped into the 80s with shortness of breath, 2.5 liters he quickly came back up to the high 90s. We started ambulation on 3L with sats in the upper 90s, but slowly dropping and reached 90% with SOB during standing rest break, put him on 4L for the remainder of ambulation with sats in the mid/low 90s.  Pt quite fatigued in getting back to room but glad that her was able to do as much walking as he did.    Follow Up Recommendations Home health PT    Equipment Recommendations  (will likely need O2)    Recommendations for Other Services       Precautions / Restrictions Precautions Precautions: Fall Restrictions Weight Bearing Restrictions: No      Mobility  Bed Mobility Overal bed mobility: Needs Assistance Bed Mobility: Supine to Sit     Supine to sit: Supervision     General bed mobility comments: Pt is able to get to sitting EOB w/o assist, though heavily reliant on the rails  Transfers Overall transfer level: Needs assistance Equipment used: Rolling walker (2 wheeled) Transfers: Sit to/from Stand Sit to Stand: Supervision         General transfer comment: Pt reports that bed at home is much higher (and easier to get out of) - again reliant on UEs but able to rise w/o assist  Ambulation/Gait Ambulation/Gait assistance: Supervision Gait Distance (Feet): 200 Feet Assistive device: Rolling walker (2 wheeled)       General Gait Details: Pt was able to circumambulate the nurses station with 2 standing rest  breaks.  He was on 3 then 4 liters O2 with sats staying in the 90s but slowly dropping the entire time, bumped to 4L at 100 ft as sats were at  90%.  Pt with no LOBs and good confidence with walker but is not at all near his baseline, especially cardiovascularly.    Stairs            Wheelchair Mobility    Modified Rankin (Stroke Patients Only)       Balance Overall balance assessment: Modified Independent   Sitting balance-Leahy Scale: Normal       Standing balance-Leahy Scale: Good                               Pertinent Vitals/Pain Pain Assessment: No/denies pain    Home Living Family/patient expects to be discharged to:: Private residence Living Arrangements: Alone Available Help at Discharge: Family(daughter lives locally) Type of Home: House Home Access: Stairs to enter Entrance Stairs-Rails: Can reach both Entrance Stairs-Number of Steps: 4   Home Equipment: Environmental consultant - 2 wheels;Cane - single point      Prior Function Level of Independence: Independent         Comments: Prior to the past 2 months of hospital admissions, pt was a limited community ambulator with use of cart at grocery store and no AD for support.  Has used [facility]  over the last few weeks     Hand Dominance        Extremity/Trunk Assessment   Upper Extremity Assessment Upper Extremity Assessment: Generalized weakness    Lower Extremity Assessment Lower Extremity Assessment: Generalized weakness(R ankle DF 3/5, L 3+/5)       Communication   Communication: No difficulties  Cognition Arousal/Alertness: Awake/alert Behavior During Therapy: WFL for tasks assessed/performed Overall Cognitive Status: Within Functional Limits for tasks assessed                                        General Comments General comments (skin integrity, edema, etc.): Pt on 2.5L on arrival, sats in the high 90s.  Removed O2 during history gathering and in less than 2 minutes was  down to 88% at rest.  Sats quickly came back up on O2, but had shortness of breath with essentially all activity.    Exercises     Assessment/Plan    PT Assessment Patient needs continued PT services  PT Problem List Decreased mobility;Decreased activity tolerance;Decreased balance;Cardiopulmonary status limiting activity       PT Treatment Interventions DME instruction;Therapeutic activities;Gait training;Therapeutic exercise;Patient/family education;Stair training;Balance training;Functional mobility training;Neuromuscular re-education    PT Goals (Current goals can be found in the Care Plan section)  Acute Rehab PT Goals Patient Stated Goal: get back to independent PLOF PT Goal Formulation: With patient Time For Goal Achievement: 10/18/19 Potential to Achieve Goals: Good    Frequency Min 2X/week   Barriers to discharge        Co-evaluation               AM-PAC PT "6 Clicks" Mobility  Outcome Measure Help needed turning from your back to your side while in a flat bed without using bedrails?: None Help needed moving from lying on your back to sitting on the side of a flat bed without using bedrails?: None Help needed moving to and from a bed to a chair (including a wheelchair)?: None Help needed standing up from a chair using your arms (e.g., wheelchair or bedside chair)?: None Help needed to walk in hospital room?: A Little Help needed climbing 3-5 steps with a railing? : A Little 6 Click Score: 22    End of Session Equipment Utilized During Treatment: Gait belt Activity Tolerance: Patient limited by fatigue Patient left: with chair alarm set;with call bell/phone within reach Nurse Communication: Mobility status PT Visit Diagnosis: Unsteadiness on feet (R26.81);Difficulty in walking, not elsewhere classified (R26.2)    Time: 8828-0034 PT Time Calculation (min) (ACUTE ONLY): 43 min   Charges:   PT Evaluation $PT Eval Moderate Complexity: 1 Mod PT  Treatments $Gait Training: 8-22 mins        Kreg Shropshire, DPT 10/04/2019, 1:34 PM

## 2019-10-04 NOTE — Progress Notes (Signed)
[place] at [place] NAME: Rick Gilmore    MR#:  [id number]  DATE OF BIRTH:  1941/03/09  SUBJECTIVE:  Cs/p AICD battery replacement, sob improving, he would like to take lasix even with low BP as he feel it's helped him a lot REVIEW OF SYSTEMS:  CONSTITUTIONAL: No fever, fatigue or weakness.  EYES: No blurred or double vision.  EARS, NOSE, AND THROAT: No tinnitus or ear pain.  RESPIRATORY: No cough, improving shortness of breath, denies wheezing or hemoptysis.  CARDIOVASCULAR: No chest pain, orthopnea, edema.  GASTROINTESTINAL: No nausea, vomiting, diarrhea or abdominal pain.  GENITOURINARY: No dysuria, hematuria.  ENDOCRINE: No polyuria, nocturia,  HEMATOLOGY: No anemia, easy bruising or bleeding SKIN: No rash or lesion. MUSCULOSKELETAL: No joint pain or arthritis.   NEUROLOGIC: No tingling, numbness, weakness.  PSYCHIATRY: No anxiety or depression.   DRUG ALLERGIES:  No Known Allergies  VITALS:  Blood pressure (!) 99/45, pulse 75, temperature 98.8 F (37.1 C), temperature source Oral, resp. rate 19, height 6\' 2"  (1.88 m), weight 102.8 kg, SpO2 90 %.  PHYSICAL EXAMINATION:  GENERAL:  78 y.o.-year-old patient lying in the bed with no acute distress.  EYES: Pupils equal, round, reactive to light and accommodation. No scleral icterus. Extraocular muscles intact.  HEENT: Head atraumatic, normocephalic. Oropharynx and nasopharynx clear.  NECK:  Supple, no jugular venous distention. No thyroid enlargement, no tenderness.  LUNGS: Moderate breath sounds bilaterally, no wheezing, few rales,rhonchi, no crepitation. No use of accessory muscles of respiration.  CARDIOVASCULAR: S1, S2 normal. No murmurs, rubs, or gallops.  ABDOMEN: Soft, nontender, nondistended. Bowel sounds present.  EXTREMITIES: No pedal edema, cyanosis, or clubbing.  NEUROLOGIC: Cranial nerves II through XII are intact. Muscle strength 5/5 in all extremities. Sensation  intact. Gait not checked.  PSYCHIATRIC: The patient is alert and oriented x 3.  SKIN: No obvious rash, lesion, or ulcer.    LABORATORY PANEL:   CBC Recent Labs  Lab 10/04/19 0454  WBC 7.5  HGB 9.4*  HCT 29.2*  PLT 93*   ------------------------------------------------------------------------------------------------------------------  Chemistries  Recent Labs  Lab 09/30/19 1719  10/04/19 0454  NA 135   < > 133*  K 5.6*   < > 3.7  CL 102   < > 98  CO2 22   < > 25  GLUCOSE 117*   < > 110*  BUN 35*   < > 25*  CREATININE 2.35*   < > 1.59*  CALCIUM 9.4   < > 8.6*  AST 17  --   --   ALT 12  --   --   ALKPHOS 59  --   --   BILITOT 1.5*  --   --    < > = values in this interval not displayed.   ------------------------------------------------------------------------------------------------------------------  Cardiac Enzymes No results for input(s): TROPONINI in the last 168 hours. ------------------------------------------------------------------------------------------------------------------  RADIOLOGY:  Ct Chest Wo Contrast  Result Date: 10/03/2019 CLINICAL DATA:  Shortness of breath improved with Lasix. EXAM: CT CHEST WITHOUT CONTRAST TECHNIQUE: Multidetector CT imaging of the chest was performed following the standard protocol without IV contrast. COMPARISON:  10/07/2014 FINDINGS: Cardiovascular: Median sternotomy wires are present. Mild cardiomegaly. Cardiac pacer leads are present. Calcified plaque over the left main and 3 vessel coronary arteries unchanged. Calcified plaque over the thoracic aorta which is normal in caliber. Remaining vascular structures are unremarkable. Mediastinum/Nodes: 1 cm AP window lymph node likely reactive. Several other shotty mediastinal lymph nodes. No  significant hilar adenopathy. Remaining mediastinal structures are unremarkable. Lungs/Pleura: Lungs are adequately inflated and demonstrate a moderate size right pleural effusion and small  left pleural effusion with associated atelectatic change. Mild patchy airspace process over the superior segment left lower lobe and lingula which could represent early infection. Airways are within normal. Upper Abdomen: Cholelithiasis. Calcified plaque over the abdominal aorta. Mild nodular contour of the liver. Musculoskeletal: Degenerative change of the spine. IMPRESSION: 1. Moderate size right pleural effusion and small left pleural effusion with associated bibasilar atelectasis. Subtle hazy patchy airspace density over the left upper lobe and superior segment left lower lobe which could be due to concomitant infection. 1 cm AP window lymph node likely reactive. 2.  Cardiomegaly with atherosclerotic coronary artery disease. 3.  Aortic Atherosclerosis (ICD10-I70.0). 4.  Cholelithiasis. 5.  Nodular contour of the liver suggesting degree of cirrhosis. Electronically Signed   By: Marin Olp M.D.   On: 10/03/2019 13:39    EKG:   Orders placed or performed during the hospital encounter of 09/30/19  . ED EKG  . ED EKG    ASSESSMENT AND PLAN:  This is a 78 year old male admitted for CHF exacerbation.  With ischemic cardiomyopathy  1.  CHF: Acute on chronic; systolic.    With mild to moderate right-sided pleural effusion EF per echo 3 years ago 30 to 35%.  Continue diuresis with IV Lasix - neg 3.2 liters.  Monitor renal function closely  - may resume oral torsemide at D/C   Repeat echocardiogram at 25 to 30% ejection fraction and ischemic cardiomyopathy  Repeat chest xray revealing interval increase in the right-sided pleural effusion small to moderate in size with associated atelectasis Incentive spirometry - CT chest shows moderate size right pleural effusion and small left pleural effusion with associated bibasilar atelectasis. Subtle hazy patchy airspace density over the left upper lobe and superior segment left lower lobe which could be due to concomitant infection.   2.  Atypical chest pain:  Troponin elevated but stable.  Demand ischemia The patient has a pacemaker/AICD that was due to be changed in 3 days.  The pacemaker AICD battery changed.   Continue Imdur.  Outpatient follow-up with cardiology  3.  Hypertension: Controlled; continue metoprolol and spironolactone.  4.  CKD:  Stage IV; monitor renal function.  Baseline creatinine seems to be at around 1.9 and patient is at his baseline we will continue close monitoring 5.  Atrial fibrillation: history of V. tach in the past :rate controlled; continue amiodarone.  Continue Eliquis for anticoagulation 6.  Gout: Stable; continue renally adjusted allopurinol.  Colchicine as needed 7.  Hyperlipidemia: Continue statin therapy 8.  COPD: Stable; continue Anoro Ellipta.  Albuterol as needed   .  DVT prophylaxis: Therapeutic anticoagulation   GI prophylaxis: PPI per home regimen    All the records are reviewed and case discussed with Care Management/Social Workerr. Management plans discussed with the patient, left message to daughter over phone and they are in agreement.  CODE STATUS: full coee  TOTAL TIME TAKING CARE OF THIS PATIENT: 35  minutes.   POSSIBLE D/C IN  1 DAYS, DEPENDING ON CLINICAL CONDITION.  Note: This dictation was prepared with Dragon dictation along with smaller phrase technology. Any transcriptional errors that result from this process are unintentional.   Max Sane M.D on 10/04/2019 at 8:32 PM  Between 7am to 6pm - Pager - [phone] After 6pm go to [email or web address] - [facility]  Office  [phone]  CC: Primary care physician; Crecencio Mc, MD

## 2019-10-04 NOTE — Plan of Care (Signed)
  Problem: Clinical Measurements: Goal: Diagnostic test results will improve Outcome: Progressing Goal: Respiratory complications will improve Outcome: Progressing Goal: Cardiovascular complication will be avoided Outcome: Progressing   Problem: Nutrition: Goal: Adequate nutrition will be maintained Outcome: Progressing   

## 2019-10-04 NOTE — Progress Notes (Signed)
SATURATION QUALIFICATIONS: (This note is used to comply with regulatory documentation for home oxygen)  Patient Saturations on Room Air at Rest = 88%  Patient Saturations on Room Air while Ambulating = n/a%  Patient Saturations on 3 Liters of oxygen while Ambulating = 91%  Please briefly explain why patient needs home oxygen:

## 2019-10-04 NOTE — Progress Notes (Signed)
OT Cancellation Note  Patient Details Name: Rick Gilmore MRN: [id number] DOB: 08/14/1941   Cancelled Treatment:    Reason Eval/Treat Not Completed: Patient declined, no reason specified Order received for OT evaluation and chart reviewed.  Pt was sitting up in recliner with his daughter [name] and declined OT due to fatigue.  Will attempt evaluation again later today or tomorrow.  Thank you for the referral.   Chrys Racer, OTR/L, [facility] ascom [phone] 10/04/19, 1:58 PM

## 2019-10-05 LAB — CBC
HCT: 28.5 % — ABNORMAL LOW (ref 39.0–52.0)
Hemoglobin: 9.4 g/dL — ABNORMAL LOW (ref 13.0–17.0)
MCH: 30.7 pg (ref 26.0–34.0)
MCHC: 33 g/dL (ref 30.0–36.0)
MCV: 93.1 fL (ref 80.0–100.0)
Platelets: 98 10*3/uL — ABNORMAL LOW (ref 150–400)
RBC: 3.06 MIL/uL — ABNORMAL LOW (ref 4.22–5.81)
RDW: 15.9 % — ABNORMAL HIGH (ref 11.5–15.5)
WBC: 9.1 10*3/uL (ref 4.0–10.5)
nRBC: 0 % (ref 0.0–0.2)

## 2019-10-05 LAB — BASIC METABOLIC PANEL
Anion gap: 11 (ref 5–15)
BUN: 27 mg/dL — ABNORMAL HIGH (ref 8–23)
CO2: 23 mmol/L (ref 22–32)
Calcium: 8.4 mg/dL — ABNORMAL LOW (ref 8.9–10.3)
Chloride: 97 mmol/L — ABNORMAL LOW (ref 98–111)
Creatinine, Ser: 1.42 mg/dL — ABNORMAL HIGH (ref 0.61–1.24)
GFR calc Af Amer: 54 mL/min — ABNORMAL LOW (ref >60)
GFR calc non Af Amer: 47 mL/min — ABNORMAL LOW (ref >60)
Glucose, Bld: 113 mg/dL — ABNORMAL HIGH (ref 70–99)
Potassium: 3.6 mmol/L (ref 3.5–5.1)
Sodium: 131 mmol/L — ABNORMAL LOW (ref 135–145)

## 2019-10-05 MED ORDER — POTASSIUM CHLORIDE CRYS ER 20 MEQ PO TBCR
40.0000 meq | EXTENDED_RELEASE_TABLET | Freq: Once | ORAL | Status: AC
  Administered 2019-10-05: 40 meq via ORAL
  Filled 2019-10-05: qty 2

## 2019-10-05 NOTE — Evaluation (Signed)
Occupational Therapy Evaluation Patient Details Name: Rick Gilmore MRN: [id number] DOB: 1941-05-09 Today's Date: 10/05/2019    History of Present Illness 78 year old male admitted with CHF exacerbation.  PMH includes MI, COPD, CHF and CA.   Clinical Impression   Rick Gilmore was seen for OT evaluation this date. Pt reports that prior to most recent hospitalizations he was independent in all ADLs and primarily using a SPC for mobility. Pt is not currently an oxygen user in the home. Pt reports becoming easily fatigued or out of breath with minimal exertion. Pt currently requires supervision for safety during ADL management and functional mobility due to increased shortness of breath and poor activity tolerance. Pt educated in energy conservation conservation strategies including pursed lip breathing, activity pacing, home/routines modifications, work simplification, AE/DME, prioritizing of meaningful occupations, and falls prevention. Handout provided. Pt verbalized understanding but would benefit from additional skilled OT services to maximize recall and carryover of learned techniques and facilitate implementation of learned techniques into daily routines. Upon discharge, recommend [place] services.       Follow Up Recommendations  Home health OT    Equipment Recommendations  Tub/shower bench    Recommendations for Other Services       Precautions / Restrictions Precautions Precautions: Fall Restrictions Weight Bearing Restrictions: No      Mobility Bed Mobility Overal bed mobility: Needs Assistance Bed Mobility: Supine to Sit;Sit to Supine     Supine to sit: Supervision Sit to supine: Supervision   General bed mobility comments: Pt is able to get to sitting EOB w/o assist, though heavily reliant on the rails  Transfers Overall transfer level: Needs assistance Equipment used: Rolling walker (2 wheeled) Transfers: Sit to/from Stand Sit to Stand: Supervision               Balance Overall balance assessment: Modified Independent   Sitting balance-Leahy Scale: Normal       Standing balance-Leahy Scale: Good                             ADL either performed or assessed with clinical judgement   ADL                                         General ADL Comments: Pt requires supervision during functional transfer to room commode this date with VCs for safety t/o functional activity. Pt limited by cardiopulmonarty status with SpO2 dropping to 90 during functional mobility on 2L [place]. Pt able to complete standing grooming at sink with supervision for safety with no need for physical assist/set-up this date. Pt required consistent cueing for PLB and energy conservation strategies t/o ADLs this date.     Vision Baseline Vision/History: Wears glasses Wears Glasses: At all times Patient Visual Report: No change from baseline       Perception     Praxis      Pertinent Vitals/Pain Pain Assessment: No/denies pain     Hand Dominance Right   Extremity/Trunk Assessment Upper Extremity Assessment Upper Extremity Assessment: Generalized weakness   Lower Extremity Assessment Lower Extremity Assessment: Generalized weakness   Cervical / Trunk Assessment Cervical / Trunk Assessment: Normal   Communication Communication Communication: No difficulties   Cognition Arousal/Alertness: Awake/alert Behavior During Therapy: WFL for tasks assessed/performed Overall Cognitive Status: Within Functional Limits for tasks assessed  General Comments  Pt on RA at time of OT arrival. Pt noed with SpO2 flucctuating  87-92. This therapist notified RN who indicated pt should be put back on 2L [place]. O2 sats rebounded quickly to 95 with replacement of [place] and remained [facility] t/o remainder of session. RN notified.    Exercises Other Exercises Other Exercises: Pt educated in energy conservation  strategies, safe use of AE/DME for ADL management, and routines modifications to support safety and functional independence upon hospital DC.   Shoulder Instructions      Home Living Family/patient expects to be discharged to:: Private residence Living Arrangements: Alone Available Help at Discharge: Family(dtr lives locally) Type of Home: House Home Access: Stairs to enter Technical brewer of Steps: 4 Entrance Stairs-Rails: Can reach both Home Layout: One level     Bathroom Shower/Tub: Teacher, early years/pre: Standard(Pt reports having plans to have raised commode and hand rails installed in his bathroom.) Bathroom Accessibility: Yes How Accessible: Accessible via [place]: [place] - 2 wheels;Cane - single point          Prior Functioning/Environment Level of Independence: Independent        Comments: Prior to the past 2 months of hospital admissions, pt was a limited community ambulator with use of cart at grocery store and no AD for support.  Has used SPC over the last few weeks        OT Problem List: Decreased strength;Cardiopulmonary status limiting activity;Decreased activity tolerance;Decreased safety awareness;Decreased knowledge of use of DME or AE;Decreased coordination;Impaired balance (sitting and/or standing)      OT Treatment/Interventions: Self-care/ADL training;Balance training;Therapeutic exercise;Therapeutic activities;Energy conservation;DME and/or AE instruction;Patient/family education    OT Goals(Current goals can be found in the care plan section) Acute Rehab OT Goals Patient Stated Goal: get back to independent PLOF OT Goal Formulation: With patient Time For Goal Achievement: 10/19/19 Potential to Achieve Goals: Good ADL Goals Additional ADL Goal #1: Pt will independently verbalize a plan to implement at least 3 learned energy conservation strategies into his daily routines/home environment for improved safety and  functional independence upon hospital DC Additional ADL Goal #2: Pt will independently verbalize a plan to implement at least 3 learned falls prevention strategies into his daily routines/home environment for improved safety and functional independence upon hospital DC  OT Frequency: Min 1X/week   Barriers to D/C: Decreased caregiver support          Co-evaluation              AM-PAC OT "6 Clicks" Daily Activity     Outcome Measure Help from another person eating meals?: None Help from another person taking care of personal grooming?: A Little Help from another person toileting, which includes using toliet, bedpan, or urinal?: A Little Help from another person bathing (including washing, rinsing, drying)?: A Little Help from another person to put on and taking off regular upper body clothing?: A Little Help from another person to put on and taking off regular lower body clothing?: A Little 6 Click Score: 19   End of Session Equipment Utilized During Treatment: Gait belt;Rolling walker Nurse Communication: Other (comment)(SpO2 t/o session.)  Activity Tolerance: Patient tolerated treatment well Patient left: in bed;with call bell/phone within reach(Pt declines bed alarm)  OT Visit Diagnosis: Other abnormalities of gait and mobility (R26.89);Muscle weakness (generalized) (M62.81)                Time: 9518-8416 OT Time Calculation (min): 52 min Charges:  OT General Charges $OT Visit: 1 Visit OT Evaluation $OT Eval Moderate Complexity: 1 Mod OT Treatments $Self Care/Home Management : 38-52 mins  Shara Blazing, M.S., OTR/L Ascom: [phone] 10/05/19, 1:13 PM

## 2019-10-05 NOTE — Progress Notes (Signed)
Patient discharged to home.  Verbalizes understanding of discharge instructions. 

## 2019-10-05 NOTE — Discharge Instructions (Signed)
Heart Failure, Diagnosis ° °Heart failure means that your heart is not able to pump blood in the right way. This makes it hard for your body to work well. Heart failure is usually a long-term (chronic) condition. You must take good care of yourself and follow your treatment plan from your doctor. °What are the causes? °This condition may be caused by: °· High blood pressure. °· Build up of cholesterol and fat in the arteries. °· Heart attack. This injures the heart muscle. °· Heart valves that do not open and close properly. °· Damage of the heart muscle. This is also called cardiomyopathy. °· Lung disease. °· Abnormal heart rhythms. °What increases the risk? °The risk of heart failure goes up as a person ages. This condition is also more likely to develop in people who: °· Are overweight. °· Are male. °· Smoke or chew tobacco. °· Abuse alcohol or illegal drugs. °· Have taken medicines that can damage the heart. °· Have diabetes. °· Have abnormal heart rhythms. °· Have thyroid problems. °· Have low blood counts (anemia). °What are the signs or symptoms? °Symptoms of this condition include: °· Shortness of breath. °· Coughing. °· Swelling of the feet, ankles, legs, or belly. °· Losing weight for no reason. °· Trouble breathing. °· Waking from sleep because of the need to sit up and get more air. °· Rapid heartbeat. °· Being very tired. °· Feeling dizzy, or feeling like you may pass out (faint). °· Having no desire to eat. °· Feeling like you may vomit (nauseous). °· Peeing (urinating) more at night. °· Feeling confused. °How is this treated? ° °  ° °This condition may be treated with: °· Medicines. These can be given to treat blood pressure and to make the heart muscles stronger. °· Changes in your daily life. These may include eating a healthy diet, staying at a healthy body weight, quitting tobacco and illegal drug use, or doing exercises. °· Surgery. Surgery can be done to open blocked valves, or to put devices in  the heart, such as pacemakers. °· A donor heart (heart transplant). You will receive a healthy heart from a donor. °Follow these instructions at home: °· Treat other conditions as told by your doctor. These may include high blood pressure, diabetes, thyroid disease, or abnormal heart rhythms. °· Learn as much as you can about heart failure. °· Get support as you need it. °· Keep all follow-up visits as told by your doctor. This is important. °Summary °· Heart failure means that your heart is not able to pump blood in the right way. °· This condition is caused by high blood pressure, heart attack, or damage of the heart muscle. °· Symptoms of this condition include shortness of breath and swelling of the feet, ankles, legs, or belly. You may also feel very tired or feel like you may vomit. °· You may be treated with medicines, surgery, or changes in your daily life. °· Treat other health conditions as told by your doctor. °This information is not intended to replace advice given to you by your health care provider. Make sure you discuss any questions you have with your health care provider. °Document Released: 08/31/2008 Document Revised: 02/09/2019 Document Reviewed: 02/09/2019 °Elsevier Patient Education © 2020 Elsevier Inc. ° °

## 2019-10-05 NOTE — TOC Transition Note (Signed)
Transition of Care [facility]) - CM/SW Discharge Note   Patient Details  Name: Rick Gilmore MRN: [id number] Date of Birth: 1941-10-30  Transition of [facility]) CM/SW Contact:  Ross Ludwig, LCSW Phone Number: 10/05/2019, 9:55 PM   Clinical Narrative:     Patient was requalified for oxygen, and is discharging home with home health and oxygen.  Patient stated he would rather not go oxygen, but expressed understanding that he does need it temporarily.  Patient did not express any other issues or concerns, his family will transport him home.   Final next level of care: [place] Barriers to Discharge: Barriers Resolved   Patient Goals and CMS Choice Patient states their goals for this hospitalization and ongoing recovery are:: To return back home with home health. CMS Medicare.gov Compare Post Acute Care list provided to:: Patient Choice offered to / list presented to : Patient  Discharge Placement                       Discharge Plan and Services     Post Acute Care Choice: Home Health          DME Arranged: Oxygen DME Agency: [employer] Date DME Agency Contacted: 10/05/19 Time DME Agency Contacted: 92 Representative spoke with at DME Agency: [place]: RN, PT, OT, Nurse's Aide [place] Agency: [place] ([place]) Date [place]: 10/05/19 Time [place]: 69 Representative spoke with at [place]: [place] ([place]) Interventions     Readmission Risk Interventions Readmission Risk Prevention Plan 10/01/2019 09/11/2019  Transportation Screening Complete Complete  PCP or Specialist Appt within 3-5 Days Complete Complete  HRI or [place] Complete Complete  Social Work Consult for [place] Planning/Counseling Complete Complete  Palliative Care Screening Not Applicable Not Applicable  Medication Review Press photographer) Complete Complete  Some recent data might be hidden

## 2019-10-05 NOTE — Progress Notes (Signed)
Patient ambulating in hall on Room air.  O2 sat ranged from 88%-94%

## 2019-10-06 NOTE — Discharge Summary (Signed)
[place] [facility] [place] NAME: Rick Gilmore    MR#:  [id number]  DATE OF BIRTH:  [birth year]  DATE OF ADMISSION:  09/30/2019   ADMITTING PHYSICIAN: Harrie Foreman, MD  DATE OF DISCHARGE: 10/05/2019  4:38 PM  PRIMARY CARE PHYSICIAN: Crecencio Mc, MD   ADMISSION DIAGNOSIS:  Shortness of breath [R06.02] Hyperkalemia [E87.5] Chest pain, unspecified type [R07.9] DISCHARGE DIAGNOSIS:  Active Problems:   Acute on chronic systolic CHF (congestive heart failure) ([place])  SECONDARY DIAGNOSIS:   Past Medical History:  Diagnosis Date   AICD (automatic cardioverter/defibrillator) present    Anemia    Arrhythmia    a fib   Atrial fibrillation ([place])    on Coumadin   CAD (coronary artery disease)    Cancer ([place]) 2001   Prostate, XRT and implant   CHF (congestive heart failure) (HCC)    COPD (chronic obstructive pulmonary disease) ([place])    Erectile dysfunction    GERD (gastroesophageal reflux disease)    Hyperlipidemia    Hypertension    Ischemic cardiomyopathy    s/p AICD/pacer 2009, replaced 2010 [facility]   MI (myocardial infarction) ([place]) 1988   Personal history of prostate cancer 2001   s/p XRT and implant ([place])   Prostate cancer ([place])    Sleep apnea, obstructive    uses CPAP nightly   Thrombocytopenia (HCC)    Tubular adenoma    colon polyp   Vitamin B 12 deficiency    HOSPITAL COURSE:  This is a 78 year old male admitted for CHF exacerbation.  With ischemic cardiomyopathy  1. CHF: Acute on chronic; systolic.   With mild to moderate right-sided pleural effusion EF per echo 3 years ago 30 to 35%. may resume oral torsemide at D/C   Repeat echocardiogram at 25 to 30% ejection fraction and ischemic cardiomyopathy Repeat chest xray revealing interval increase in the right-sided pleural effusion small to moderate in size with associated atelectasis - CT chest shows moderate size right pleural effusion and  small left pleural effusion with associated bibasilar atelectasis. Subtle hazy patchy airspace density over the left upper lobe and superior segment left lower lobe which could be due to concomitant infection.   2. Atypical chest pain: Troponin elevated but stable.  Demand ischemiaThe patient has a pacemaker/AICD that was due to be changed in 3 days.  The pacemaker AICD battery changed. Continue Imdur.  Outpatient follow-up with cardiology  3. Hypertension: Controlled; continue metoprolol and spironolactone.  4. CKD: Stage IV; at baseline 5. Atrial fibrillation: history of V. tach in the past :rate controlled; continue amiodarone. Continue Eliquis for anticoagulation 6. Gout: Stable; continue renally adjusted allopurinol. Colchicine as needed 7. Hyperlipidemia: Continue statin therapy 8. COPD: Stable; continue Anoro Ellipta. Albuterol as needed  DISCHARGE CONDITIONS:  stable CONSULTS OBTAINED:   DRUG ALLERGIES:  No Known Allergies DISCHARGE MEDICATIONS:   Allergies as of 10/05/2019   No Known Allergies     Medication List    TAKE these medications   albuterol 108 (90 Base) MCG/ACT inhaler Commonly known as: VENTOLIN HFA Inhale 2 puffs into the lungs every 6 (six) hours as needed for wheezing or shortness of breath.   allopurinol 100 MG tablet Commonly known as: ZYLOPRIM Take 1 tablet (100 mg total) by mouth daily.   amiodarone 200 MG tablet Commonly known as: Pacerone Take 1 tablet (200 mg total) by mouth daily.   Anoro Ellipta 62.5-25 MCG/INH Aepb Generic drug: umeclidinium-vilanterol USE 1 INHALATION DAILY What  changed: See the new instructions.   colchicine 0.6 MG tablet Take 0.6 mg by mouth as needed.   Cyanocobalamin 1000 MCG Subl Place 1 tablet (1,000 mcg total) under the tongue every morning.   Eliquis 5 MG Tabs tablet Generic drug: apixaban Take 5 mg by mouth 2 (two) times daily.   escitalopram 10 MG tablet Commonly known as:  LEXAPRO Take 1 tablet (10 mg total) by mouth daily.   fluticasone 50 MCG/ACT nasal spray Commonly known as: FLONASE USE 1 SPRAY IN EACH NOSTRIL DAILY What changed: how much to take   isosorbide mononitrate 30 MG 24 hr tablet Commonly known as: IMDUR Take 30 mg by mouth daily.   magnesium oxide 400 MG tablet Commonly known as: MAG-OX Take 400 mg by mouth 2 (two) times daily.   metoprolol succinate 50 MG 24 hr tablet Commonly known as: TOPROL-XL Take 1 tablet (50 mg total) by mouth daily. Take with or immediately following a meal.   montelukast 10 MG tablet Commonly known as: SINGULAIR Take 1 tablet (10 mg total) by mouth at bedtime.   pantoprazole 40 MG tablet Commonly known as: PROTONIX Take 1 tablet (40 mg total) by mouth daily.   potassium chloride SA 20 MEQ tablet Commonly known as: KLOR-CON Take 1 tablet (20 mEq total) by mouth daily.   pravastatin 80 MG tablet Commonly known as: PRAVACHOL Take 80 mg by mouth daily.   spironolactone 25 MG tablet Commonly known as: ALDACTONE Take 25 mg by mouth daily.   torsemide 20 MG tablet Commonly known as: DEMADEX Take 1 tablet (20 mg total) by mouth daily.      DISCHARGE INSTRUCTIONS:   DIET:  Cardiac diet DISCHARGE CONDITION:  Stable ACTIVITY:  Activity as tolerated OXYGEN:  Home Oxygen: Yes.    Oxygen Delivery: 2 liters via n.c. DISCHARGE LOCATION:  home with HHPT  If you experience worsening of your admission symptoms, develop shortness of breath, life threatening emergency, suicidal or homicidal thoughts you must seek medical attention immediately by calling 911 or calling your MD immediately  if symptoms less severe.  You Must read complete instructions/literature along with all the possible adverse reactions/side effects for all the Medicines you take and that have been prescribed to you. Take any new Medicines after you have completely understood and accpet all the possible adverse reactions/side effects.    Please note  You were cared for by a hospitalist during your hospital stay. If you have any questions about your discharge medications or the care you received while you were in the hospital after you are discharged, you can call the unit and asked to speak with the hospitalist on call if the hospitalist that took care of you is not available. Once you are discharged, your primary care physician will handle any further medical issues. Please note that NO REFILLS for any discharge medications will be authorized once you are discharged, as it is imperative that you return to your primary care physician (or establish a relationship with a primary care physician if you do not have one) for your aftercare needs so that they can reassess your need for medications and monitor your lab values.    On the day of Discharge:  VITAL SIGNS:  Blood pressure 114/65, pulse 75, temperature 98.2 F (36.8 C), temperature source Oral, resp. rate 18, height 6\' 2"  (1.88 m), weight 108 kg, SpO2 94 %. PHYSICAL EXAMINATION:  GENERAL:  78 y.o.-year-old patient lying in the bed with no acute distress.  EYES:  Pupils equal, round, reactive to light and accommodation. No scleral icterus. Extraocular muscles intact.  HEENT: Head atraumatic, normocephalic. Oropharynx and nasopharynx clear.  NECK:  Supple, no jugular venous distention. No thyroid enlargement, no tenderness.  LUNGS: Normal breath sounds bilaterally, no wheezing, rales,rhonchi or crepitation. No use of accessory muscles of respiration.  CARDIOVASCULAR: S1, S2 normal. No murmurs, rubs, or gallops.  ABDOMEN: Soft, non-tender, non-distended. Bowel sounds present. No organomegaly or mass.  EXTREMITIES: No pedal edema, cyanosis, or clubbing.  NEUROLOGIC: Cranial nerves II through XII are intact. Muscle strength 5/5 in all extremities. Sensation intact. Gait not checked.  PSYCHIATRIC: The patient is alert and oriented x 3.  SKIN: No obvious rash, lesion, or ulcer.   DATA REVIEW:   CBC Recent Labs  Lab 10/05/19 0636  WBC 9.1  HGB 9.4*  HCT 28.5*  PLT 98*    Chemistries  Recent Labs  Lab 09/30/19 1719  10/05/19 0636  NA 135   < > 131*  K 5.6*   < > 3.6  CL 102   < > 97*  CO2 22   < > 23  GLUCOSE 117*   < > 113*  BUN 35*   < > 27*  CREATININE 2.35*   < > 1.42*  CALCIUM 9.4   < > 8.4*  AST 17  --   --   ALT 12  --   --   ALKPHOS 59  --   --   BILITOT 1.5*  --   --    < > = values in this interval not displayed.     Follow-up Information    [facility] Follow up on 10/15/2019.   Specialty: Cardiology Why: at 9:30am. Please enter through the [place] entrance Contact information: [place] [phone]       Crecencio Mc, Page on 10/12/2019.   Specialty: Internal Medicine Why: Please schedule hospital follow up appt within 5-7 days after discharge....APPOINTMENT AT [employer] information: [place] [address] [phone]        Corey Skains, MD. Go on 10/18/2019.   Specialty: Cardiology Why: APPOINTMENT AT 11AM Contact information: [address] [facility] [place] [address] [phone]           Management plans discussed with the patient, family and they are in agreement.  CODE STATUS: Prior   TOTAL TIME TAKING CARE OF THIS PATIENT: 45 minutes.    Max Sane M.D on 10/06/2019 at 2:30 PM  Between 7am to 6pm - Pager - [phone]  After 6pm go to [email or web address] - Proofreader  [facility]  Office  [phone]  CC: Primary care physician; Crecencio Mc, MD   Note: This dictation was prepared with Dragon dictation along with smaller phrase technology. Any transcriptional errors that result from this process are unintentional.

## 2019-10-07 DIAGNOSIS — I2510 Atherosclerotic heart disease of native coronary artery without angina pectoris: Secondary | ICD-10-CM | POA: Diagnosis not present

## 2019-10-07 DIAGNOSIS — I130 Hypertensive heart and chronic kidney disease with heart failure and stage 1 through stage 4 chronic kidney disease, or unspecified chronic kidney disease: Secondary | ICD-10-CM | POA: Diagnosis not present

## 2019-10-07 DIAGNOSIS — G4733 Obstructive sleep apnea (adult) (pediatric): Secondary | ICD-10-CM | POA: Diagnosis not present

## 2019-10-07 DIAGNOSIS — I255 Ischemic cardiomyopathy: Secondary | ICD-10-CM | POA: Diagnosis not present

## 2019-10-07 DIAGNOSIS — N183 Chronic kidney disease, stage 3 unspecified: Secondary | ICD-10-CM | POA: Diagnosis not present

## 2019-10-07 DIAGNOSIS — I5023 Acute on chronic systolic (congestive) heart failure: Secondary | ICD-10-CM | POA: Diagnosis not present

## 2019-10-08 ENCOUNTER — Telehealth: Payer: Self-pay | Admitting: Internal Medicine

## 2019-10-08 ENCOUNTER — Other Ambulatory Visit: Payer: Self-pay | Admitting: *Deleted

## 2019-10-08 DIAGNOSIS — I5023 Acute on chronic systolic (congestive) heart failure: Secondary | ICD-10-CM | POA: Diagnosis not present

## 2019-10-08 DIAGNOSIS — I2510 Atherosclerotic heart disease of native coronary artery without angina pectoris: Secondary | ICD-10-CM | POA: Diagnosis not present

## 2019-10-08 DIAGNOSIS — I130 Hypertensive heart and chronic kidney disease with heart failure and stage 1 through stage 4 chronic kidney disease, or unspecified chronic kidney disease: Secondary | ICD-10-CM | POA: Diagnosis not present

## 2019-10-08 DIAGNOSIS — I255 Ischemic cardiomyopathy: Secondary | ICD-10-CM | POA: Diagnosis not present

## 2019-10-08 DIAGNOSIS — G4733 Obstructive sleep apnea (adult) (pediatric): Secondary | ICD-10-CM | POA: Diagnosis not present

## 2019-10-08 DIAGNOSIS — N183 Chronic kidney disease, stage 3 unspecified: Secondary | ICD-10-CM | POA: Diagnosis not present

## 2019-10-08 NOTE — Patient Outreach (Signed)
[place] [facility]) Care Management  10/08/2019  Jaeshaun Riva 1941/01/22 [id number]   Telephone Assessment  Post hospital f/u-unsuccessful. Provider to completed transition of care call   RN attempted outreach call post discharged 10/30 for ICD placement however unsuccessful. RN able to leave a HIPAA approved voice message requesting a call back.   PLAN:  Will rescheduled another follow up within one week.  Raina Mina, RN Care Management Coordinator [place] Office [phone]

## 2019-10-08 NOTE — Telephone Encounter (Signed)
Home Health Verbal Orders - Caller/Agency:  Stacy with [facility] Callback Number: [phone] Requesting OT/PT/Skilled Nursing/Social Work/Speech Therapy: Need [place] for PT  Frequency:  2 week 4 1 week 1  She would like to let Dr know that pt is having some coughing. Coughing started yesterday.

## 2019-10-08 NOTE — Telephone Encounter (Signed)
Tried to return call t o Stacey no voicemail it is full. OK to give verbal for as listed . PEC nurse can advise.

## 2019-10-09 ENCOUNTER — Telehealth: Payer: Self-pay

## 2019-10-09 DIAGNOSIS — I480 Paroxysmal atrial fibrillation: Secondary | ICD-10-CM | POA: Diagnosis not present

## 2019-10-09 NOTE — Telephone Encounter (Signed)
Unable to reach patient for transitional care management. First attempt. Will follow as appropriate. Hospital appointment scheduled 10/17/19 at 11:00 with pcp.

## 2019-10-09 NOTE — Telephone Encounter (Signed)
Called Marzetta Board w/ [facility].  Left message on voice mail and gave verbal orders for PT as requested.

## 2019-10-10 ENCOUNTER — Telehealth: Payer: Self-pay | Admitting: *Deleted

## 2019-10-10 NOTE — Telephone Encounter (Signed)
FYI verbal given as written below.

## 2019-10-10 NOTE — Telephone Encounter (Signed)
Transition Care Management Follow-up Telephone Call   Date discharged? 10/05/19   How have you been since you were released from the hospital? Patient states, "I am doing as good or better than I was 6 to 8 months ago. Using oxygen 2L daily and with cpap and sleeping really good." Notes he is not as strong as he was, but is working on that with PT. Denies chest pain and all other symptoms.    Do you understand why you were in the hospital? Yes, SOB and chest pain   Do you understand the discharge instructions? Yes, increase activity slowly, Oxygen 2L, follow up with pcp.   Where were you discharged to? Home   Items Reviewed:  Medications reviewed: Yes, taking all scheduled medications without issue. Daughter assists as needed.   Allergies reviewed: Yes, none new  Dietary changes reviewed: Yes, cardiac  Referrals reviewed: Yes, HHPT,Cardiology, [facility]   Functional Questionnaire:   Activities of Daily Living (ADLs):   He states they are independent in the following: toileting, grooming, self feeds States they require assistance with the following: bathing, dressing, meal prep, ambulates with walker/cane   Any transportation issues/concerns?: None at this time.    Any patient concerns? None at this time.    Confirmed importance and date/time of follow-up visits scheduled Yes, virtual appointment scheduled by cma JW, 10/12/19 @ 11:00 [email or web address]  Provider Appointment booked with Dr. Derrel Nip, pcp.  Confirmed with patient if condition begins to worsen call PCP or go to the ER.  Patient was given the office number and encouraged to call back with question or concerns.  : Yes

## 2019-10-10 NOTE — Telephone Encounter (Signed)
Copied from [place] [phone]. Topic: Quick Communication - Home Health Verbal Orders >> Oct 10, 2019  3:18 PM Virl Axe D wrote: Caller/Agency: Mendel Ryder, RN/[facility] Callback Number: [phone] Requesting OT/PT/Skilled Nursing/Social Work/Speech Therapy: Continuation of Care including nurse aide Frequency: 1 week 1/ 2 week 1/ 1 week 4 / 1 every other week 3

## 2019-10-11 ENCOUNTER — Other Ambulatory Visit: Payer: Self-pay | Admitting: *Deleted

## 2019-10-11 DIAGNOSIS — I130 Hypertensive heart and chronic kidney disease with heart failure and stage 1 through stage 4 chronic kidney disease, or unspecified chronic kidney disease: Secondary | ICD-10-CM | POA: Diagnosis not present

## 2019-10-11 DIAGNOSIS — N183 Chronic kidney disease, stage 3 unspecified: Secondary | ICD-10-CM | POA: Diagnosis not present

## 2019-10-11 DIAGNOSIS — I255 Ischemic cardiomyopathy: Secondary | ICD-10-CM | POA: Diagnosis not present

## 2019-10-11 DIAGNOSIS — I2510 Atherosclerotic heart disease of native coronary artery without angina pectoris: Secondary | ICD-10-CM | POA: Diagnosis not present

## 2019-10-11 DIAGNOSIS — G4733 Obstructive sleep apnea (adult) (pediatric): Secondary | ICD-10-CM | POA: Diagnosis not present

## 2019-10-11 DIAGNOSIS — I5023 Acute on chronic systolic (congestive) heart failure: Secondary | ICD-10-CM | POA: Diagnosis not present

## 2019-10-11 NOTE — Patient Outreach (Signed)
[facility]) Care Management  10/11/2019  Rick Gilmore [birth year] [id number]   Telephone Assessment-Unsuccessful   Outreach attempt today unsuccessful. RN able to leave a HIPAA approved voice message requesting a call back.    PLAN: Will further engage at that time and obtain an update on pt's ongiong recovery from his new ICD placement. Will continue outreach attempts.  Raina Mina, RN Care Management Coordinator [place] Office [phone]

## 2019-10-12 ENCOUNTER — Other Ambulatory Visit: Payer: Self-pay

## 2019-10-12 ENCOUNTER — Telehealth: Payer: Self-pay | Admitting: Internal Medicine

## 2019-10-12 ENCOUNTER — Ambulatory Visit (INDEPENDENT_AMBULATORY_CARE_PROVIDER_SITE_OTHER): Payer: Medicare Other | Admitting: Internal Medicine

## 2019-10-12 ENCOUNTER — Inpatient Hospital Stay: Payer: Medicare Other | Admitting: Internal Medicine

## 2019-10-12 DIAGNOSIS — Z09 Encounter for follow-up examination after completed treatment for conditions other than malignant neoplasm: Secondary | ICD-10-CM

## 2019-10-12 NOTE — Telephone Encounter (Unsigned)
Copied from [place] [id number]. Topic: Quick Communication - Home Health Verbal Orders >> Oct 12, 2019 10:44 AM Yvette Rack wrote: Caller/Agency: Sherlynn Stalls with [facility]  Callback Number: [phone] Requesting OT/PT/Skilled Nursing/Social Work/Speech Therapy: OT Frequency: 1 time a week for 1 week, 2 times a week for 1 week, and then 1 time a week for 1 week

## 2019-10-12 NOTE — Progress Notes (Signed)
Virtual Visit via [email or web address]  This visit type was conducted due to national recommendations for restrictions regarding the COVID-19 pandemic (e.g. social distancing).  This format is felt to be most appropriate for this patient at this time.  All issues noted in this document were discussed and addressed.  No physical exam was performed (except for noted visual exam findings with Video Visits).   I connected with@ on 10/12/19 at 11:00 AM EST by a video enabled telemedicine application and verified that I am speaking with the correct person using two identifiers. Location patient: home Location provider: work or home office Persons participating in the virtual visit: patient, provider  I discussed the limitations, risks, security and privacy concerns of performing an evaluation and management service by telephone and the availability of in person appointments. I also discussed with the patient that there may be a patient responsible charge related to this service. The patient expressed understanding and agreed to proceed.  Reason for visit: hospital follow up   HPI:  4th admission since early September.  Admitted with chest pain , recurrent pleural effusion on Oct 29. Demand ischemia, battery for AICD was changed .  Diuresed  And discharged to home.  He feels much better than he has in several weeks  His weight has been stable since discharge   ROS: See pertinent positives and negatives per HPI.  Past Medical History:  Diagnosis Date  . AICD (automatic cardioverter/defibrillator) present   . Anemia   . Arrhythmia    a fib  . Atrial fibrillation ([place])    on Coumadin  . CAD (coronary artery disease)   . Cancer [facility]) 2001   Prostate, XRT and implant  . CHF (congestive heart failure) ([place])   . COPD (chronic obstructive pulmonary disease) ([place])   . Erectile dysfunction   . GERD (gastroesophageal reflux disease)   . Hyperlipidemia   . Hypertension   . Ischemic cardiomyopathy    s/p  AICD/pacer 2009, replaced 2010 [facility]  . MI (myocardial infarction) ([place]) 1988  . Personal history of prostate cancer 2001   s/p XRT and implant ([place])  . Prostate cancer ([place])   . Sleep apnea, obstructive    uses CPAP nightly  . Thrombocytopenia ([place])   . Tubular adenoma    colon polyp  . Vitamin B 12 deficiency     Past Surgical History:  Procedure Laterality Date  . CARDIAC CATHETERIZATION    . COLONOSCOPY WITH PROPOFOL [name] 05/30/2015   Procedure: COLONOSCOPY WITH PROPOFOL;  Surgeon: Manya Silvas, MD;  Location: [facility] ENDOSCOPY;  Service: Endoscopy;  Laterality: [name];  . COLONOSCOPY WITH PROPOFOL [name] 07/16/2019   Procedure: COLONOSCOPY WITH PROPOFOL;  Surgeon: Toledo, Benay Pike, MD;  Location: [facility] ENDOSCOPY;  Service: Gastroenterology;  Laterality: [name];  . CORONARY ARTERY BYPASS GRAFT  1988  . ESOPHAGOGASTRODUODENOSCOPY [name] 05/30/2015   Procedure: ESOPHAGOGASTRODUODENOSCOPY (EGD);  Surgeon: Manya Silvas, MD;  Location[facility] ENDOSCOPY;  Service: Endoscopy;  Laterality: [name];  . ESOPHAGOGASTRODUODENOSCOPY (EGD) WITH PROPOFOL [name] 07/16/2019   Procedure: ESOPHAGOGASTRODUODENOSCOPY (EGD) WITH PROPOFOL;  Surgeon: Toledo, Benay Pike, MD;  Location: [facility] ENDOSCOPY;  Service: Gastroenterology;  Laterality: [name];  . IMPLANTABLE CARDIOVERTER DEFIBRILLATOR (ICD) GENERATOR CHANGE Left 10/02/2019   Procedure: ICD GENERATOR CHANGE;  Surgeon: Isaias Cowman, MD;  Location: [facility] ORS;  Service: Cardiovascular;  Laterality: Left;  . IMPLANTABLE CARDIOVERTER DEFIBRILLATOR GENERATOR CHANGE  April 2014   Bi V RRR  . INSERT / REPLACE / REMOVE PACEMAKER  2014  . VASECTOMY  Family History  Problem Relation Age of Onset  . Hypertension Mother   . Hypertension Father   . Cancer Father        prostate, throat  . Emphysema Father   . Heart disease Maternal Grandmother   . Heart disease Paternal Grandmother     SOCIAL HX:  reports that he has never smoked. He has never used smokeless  tobacco. He reports current alcohol use of about 4.0 standard drinks of alcohol per week. He reports that he does not use drugs.   Current Outpatient Medications:  .  albuterol (PROVENTIL HFA;VENTOLIN HFA) 108 (90 Base) MCG/ACT inhaler, Inhale 2 puffs into the lungs every 6 (six) hours as needed for wheezing or shortness of breath., Disp: 1 Inhaler, Rfl: 1 .  allopurinol (ZYLOPRIM) 100 MG tablet, Take 1 tablet (100 mg total) by mouth daily., Disp: 90 tablet, Rfl: 3 .  amiodarone (PACERONE) 200 MG tablet, Take 1 tablet (200 mg total) by mouth daily., Disp: 30 tablet, Rfl: 0 .  ANORO ELLIPTA 62.5-25 MCG/INH AEPB, USE 1 INHALATION DAILY (Patient taking differently: Inhale 1 puff into the lungs daily. ), Disp: 60 each, Rfl: 0 .  apixaban (ELIQUIS) 5 MG TABS tablet, Take 5 mg by mouth 2 (two) times daily. , Disp: , Rfl:  .  colchicine 0.6 MG tablet, Take 0.6 mg by mouth as needed. , Disp: , Rfl:  .  Cyanocobalamin 1000 MCG SUBL, Place 1 tablet (1,000 mcg total) under the tongue every morning., Disp: 90 tablet, Rfl: 3 .  escitalopram (LEXAPRO) 10 MG tablet, Take 1 tablet (10 mg total) by mouth daily., Disp: 90 tablet, Rfl: 3 .  fluticasone (FLONASE) 50 MCG/ACT nasal spray, USE 1 SPRAY IN EACH NOSTRIL DAILY (Patient taking differently: Place 2 sprays into both nostrils daily. ), Disp: 48 g, Rfl: 3 .  isosorbide mononitrate (IMDUR) 30 MG 24 hr tablet, Take 30 mg by mouth daily., Disp: , Rfl: 11 .  magnesium oxide (MAG-OX) 400 MG tablet, Take 400 mg by mouth 2 (two) times daily., Disp: , Rfl:  .  metoprolol succinate (TOPROL-XL) 50 MG 24 hr tablet, Take 1 tablet (50 mg total) by mouth daily. Take with or immediately following a meal., Disp: 30 tablet, Rfl: 0 .  montelukast (SINGULAIR) 10 MG tablet, Take 1 tablet (10 mg total) by mouth at bedtime., Disp: 90 tablet, Rfl: 3 .  pantoprazole (PROTONIX) 40 MG tablet, Take 1 tablet (40 mg total) by mouth daily., Disp: 90 tablet, Rfl: 3 .  potassium chloride SA  (K-DUR) 20 MEQ tablet, Take 1 tablet (20 mEq total) by mouth daily., Disp: 90 tablet, Rfl: 3 .  pravastatin (PRAVACHOL) 80 MG tablet, Take 80 mg by mouth daily., Disp: , Rfl:  .  spironolactone (ALDACTONE) 25 MG tablet, Take 25 mg by mouth daily., Disp: , Rfl:  .  torsemide (DEMADEX) 20 MG tablet, Take 1 tablet (20 mg total) by mouth daily., Disp: 30 tablet, Rfl: 0  EXAM:  VITALS per patient if applicable:  GENERAL: alert, oriented, appears well and in no acute distress  HEENT: atraumatic, conjunttiva clear, no obvious abnormalities on inspection of external nose and ears  NECK: normal movements of the head and neck  LUNGS: on inspection no signs of respiratory distress, breathing rate appears normal, no obvious gross SOB, gasping or wheezing  CV: no obvious cyanosis  MS: moves all visible extremities without noticeable abnormality  PSYCH/NEURO: pleasant and cooperative, no obvious depression or anxiety, speech and thought processing grossly  intact  ASSESSMENT AND PLAN:  Discussed the following assessment and plan:  Hospital discharge follow-up  Hospital discharge follow-up Patient has had 4 admissions in the last 60 days.  .  Patient is stable post discharge and has been stable in weight which was corrected.  All labs , imaging studies and progress notes from admission were reviewed with patient today      I discussed the assessment and treatment plan with the patient. The patient was provided an opportunity to ask questions and all were answered. The patient agreed with the plan and demonstrated an understanding of the instructions.   The patient was advised to call back or seek an in-person evaluation if the symptoms worsen or if the condition fails to improve as anticipated.  I provided  25 minutes of non-face-to-face time during this encounter reviewing patient's current problems and post surgeries.  Providing counseling on the above mentioned problems , and coordination  of  care .  Crecencio Mc, MD

## 2019-10-13 DIAGNOSIS — N183 Chronic kidney disease, stage 3 unspecified: Secondary | ICD-10-CM | POA: Diagnosis not present

## 2019-10-13 DIAGNOSIS — I130 Hypertensive heart and chronic kidney disease with heart failure and stage 1 through stage 4 chronic kidney disease, or unspecified chronic kidney disease: Secondary | ICD-10-CM | POA: Diagnosis not present

## 2019-10-13 DIAGNOSIS — I5023 Acute on chronic systolic (congestive) heart failure: Secondary | ICD-10-CM | POA: Diagnosis not present

## 2019-10-13 DIAGNOSIS — I2510 Atherosclerotic heart disease of native coronary artery without angina pectoris: Secondary | ICD-10-CM | POA: Diagnosis not present

## 2019-10-13 DIAGNOSIS — G4733 Obstructive sleep apnea (adult) (pediatric): Secondary | ICD-10-CM | POA: Diagnosis not present

## 2019-10-13 DIAGNOSIS — I255 Ischemic cardiomyopathy: Secondary | ICD-10-CM | POA: Diagnosis not present

## 2019-10-14 NOTE — Assessment & Plan Note (Signed)
Patient has had 4 admissions in the last 60 days.  .  Patient is stable post discharge and has been stable in weight which was corrected.  All labs , imaging studies and progress notes from admission were reviewed with patient today

## 2019-10-15 ENCOUNTER — Telehealth (HOSPITAL_COMMUNITY): Payer: Self-pay

## 2019-10-15 ENCOUNTER — Telehealth: Payer: Self-pay

## 2019-10-15 ENCOUNTER — Ambulatory Visit: Payer: Medicare Other | Admitting: Family

## 2019-10-15 NOTE — Telephone Encounter (Signed)
Copied from [place] [id number]. Topic: General - Inquiry >> Oct 15, 2019  4:53 PM Alease Frame wrote: Reason for CRM: Patient has requested all home health be cancelled due to not be homebound

## 2019-10-15 NOTE — Telephone Encounter (Signed)
Ok to give verbal to cancer home health

## 2019-10-15 NOTE — Telephone Encounter (Signed)
Attempted to contact to set up home visit, no answer left message for him to contact me back.    [place] [phone]

## 2019-10-15 NOTE — Telephone Encounter (Signed)
Called and spoke to [place] w/ [facility] and gave verbal orders for OT as requested.

## 2019-10-17 ENCOUNTER — Inpatient Hospital Stay: Payer: Medicare Other | Admitting: Internal Medicine

## 2019-10-17 NOTE — Progress Notes (Signed)
Patient ID: Rick Gilmore, male    DOB: [birth year], 78 y.o.   MRN: [id number]  HPI  Rick Gilmore is a 78 y/o male with a history of thrombocytopenia, obstructive sleep apnea (with CPAP), prostate cancer, MI, HTN, hyperlipidemia, GERD, COPD, atrial fibrillation, anemia, AICD and chronic heart failure.   Echo report from 10/01/2019 reviewed and showed an EF of 25-30% along with severe Rick, moderate TR, trivial AR/PR and moderately elevated PA pressure. Reviewed echo done 07/04/17 which showed an EF of 30% along with severe Rick/TR. Previous echo was done 09/02/16 and showed an EF of 25% along with severe Rick/TR. EF has decreased from 01/16/16 which showed an EF of 30-35% along with mild Rick/TR.   Admitted 09/30/2019 due to acute on chronic HF. Cardiology consult obtained. Troponin thought to be due to demand ischemia.  Discharged after 5 days.  Admitted 09/08/2019 for CHF was diuresed with IV lasix, went into ARF and had cardiology and nephrology consulted. He had a right paracentesis with 2.3L fluid removed. On discharge after 5 days, his entresto was stopped and torsemide decreased to 20mg  daily.   He presents today for his follow-up visit with a chief complaint of moderate fatigue upon minimal exertion. He describes this as chronic in nature having been present for several years. He has associated shortness of breath and easy bruising along with this. He denies any difficulty sleeping, abdominal distention, palpitations, pedal edema, chest pain, cough, dizziness or weight gain.   Past Medical History:  Diagnosis Date  . AICD (automatic cardioverter/defibrillator) present   . Anemia   . Arrhythmia    a fib  . Atrial fibrillation ([place])    on Coumadin  . CAD (coronary artery disease)   . Cancer [facility]) 2001   Prostate, XRT and implant  . CHF (congestive heart failure) ([place])   . COPD (chronic obstructive pulmonary disease) ([place])   . Erectile dysfunction   . GERD (gastroesophageal reflux disease)   .  Hyperlipidemia   . Hypertension   . Ischemic cardiomyopathy    s/p AICD/pacer 2009, replaced 2010 [facility]  . MI (myocardial infarction) ([place]) 1988  . Personal history of prostate cancer 2001   s/p XRT and implant ([place])  . Prostate cancer ([place])   . Sleep apnea, obstructive    uses CPAP nightly  . Thrombocytopenia ([place])   . Tubular adenoma    colon polyp  . Vitamin B 12 deficiency    Past Surgical History:  Procedure Laterality Date  . CARDIAC CATHETERIZATION    . COLONOSCOPY WITH PROPOFOL [name] 05/30/2015   Procedure: COLONOSCOPY WITH PROPOFOL;  Surgeon: Manya Silvas, MD;  Location[facility] END[facility]COPY;  Service: Endoscopy;  Laterality: [name];  . COLONOSCOPY WITH PROPOFOL [name] 07/16/2019   Procedure: COLONOSCOPY WITH PROPOFOL;  Surgeon: Toledo, Benay Pike, MD;  Location: [facility] ENDOSCOPY;  Service: Gastroenterology;  Laterality: [name];  . CORONARY ARTERY BYPASS GRAFT  1988  . ESOPHAGOGASTRODUODENOSCOPY [name] 05/30/2015   Procedure: ESOPHAGOGASTRODUODENOSCOPY (EGD);  Surgeon: Manya Silvas, MD;  Location: [facility] ENDOSCOPY;  Service: Endoscopy;  Laterality: [name];  . ESOPHAGOGASTRODUODENOSCOPY (EGD) WITH PROPOFOL [name] 07/16/2019   Procedure: ESOPHAGOGASTRODUODENOSCOPY (EGD) WITH PROPOFOL;  Surgeon: Toledo, Benay Pike, MD;  Location: [facility] ENDOSCOPY;  Service: Gastroenterology;  Laterality: [name];  . IMPLANTABLE CARDIOVERTER DEFIBRILLATOR (ICD) GENERATOR CHANGE Left 10/02/2019   Procedure: ICD GENERATOR CHANGE;  Surgeon: Isaias Cowman, MD;  Location: [facility] ORS;  Service: Cardiovascular;  Laterality: Left;  . IMPLANTABLE CARDIOVERTER DEFIBRILLATOR GENERATOR CHANGE  April 2014   Bi  V RRR  . INSERT / REPLACE / REMOVE PACEMAKER  2014  . VASECTOMY     Family History  Problem Relation Age of Onset  . Hypertension Mother   . Hypertension Father   . Cancer Father        prostate, throat  . Emphysema Father   . Heart disease Maternal Grandmother   . Heart disease Paternal Grandmother    Social History    Tobacco Use  . Smoking status: Never Smoker  . Smokeless tobacco: Never Used  Substance Use Topics  . Alcohol use: Yes    Alcohol/week: 4.0 standard drinks    Types: 2 Standard drinks or equivalent, 2 Cans of beer per week    Comment: Mixed drinks   No Known Allergies  Prior to Admission medications   Medication Sig Start Date End Date Taking? Authorizing Provider  albuterol (PROVENTIL HFA;VENTOLIN HFA) 108 (90 Base) MCG/ACT inhaler Inhale 2 puffs into the lungs every 6 (six) hours as needed for wheezing or shortness of breath. 12/26/18  Yes Guse, Jacquelynn Cree, FNP  allopurinol (ZYLOPRIM) 100 MG tablet Take 1 tablet (100 mg total) by mouth daily. 08/17/19  Yes Crecencio Mc, MD  amiodarone (PACERONE) 200 MG tablet Take 1 tablet (200 mg total) by mouth daily. 09/13/19  Yes Vaughan Basta, MD  ANORO ELLIPTA 62.5-25 MCG/INH AEPB USE 1 INHALATION DAILY Patient taking differently: Inhale 1 puff into the lungs daily.  06/12/19  Yes Wilhelmina Mcardle, MD  Cyanocobalamin 1000 MCG SUBL Place 1 tablet (1,000 mcg total) under the tongue every morning. 06/15/17  Yes Crecencio Mc, MD  escitalopram (LEXAPRO) 10 MG tablet Take 1 tablet (10 mg total) by mouth daily. 08/17/19  Yes Crecencio Mc, MD  isosorbide mononitrate (IMDUR) 30 MG 24 hr tablet Take 30 mg by mouth daily.   Yes [provider]  magnesium oxide (MAG-OX) 400 MG tablet Take 400 mg by mouth 2 (two) times daily.   Yes [provider]  metoprolol succinate (TOPROL-XL) 50 MG 24 hr tablet Take 1 tablet (50 mg total) by mouth daily. Take with or immediately following a meal. 09/14/19  Yes Vaughan Basta, MD  montelukast (SINGULAIR) 10 MG tablet Take 1 tablet (10 mg total) by mouth at bedtime. 08/17/19  Yes Crecencio Mc, MD  pantoprazole (PROTONIX) 40 MG tablet Take 1 tablet (40 mg total) by mouth daily. 05/10/19  Yes Crecencio Mc, MD  potassium chloride SA (K-DUR) 20 MEQ tablet Take 1 tablet (20 mEq total) by  mouth daily. 08/17/19  Yes Crecencio Mc, MD  pravastatin (PRAVACHOL) 80 MG tablet Take 80 mg by mouth daily.   Yes [provider]  spironolactone (ALDACTONE) 25 MG tablet Take 25 mg by mouth daily.   Yes [provider]  torsemide (DEMADEX) 20 MG tablet Take 1 tablet (20 mg total) by mouth daily. 09/14/19  Yes Vaughan Basta, MD  apixaban (ELIQUIS) 5 MG TABS tablet Take 5 mg by mouth 2 (two) times daily.  06/12/19   [provider]  colchicine 0.6 MG tablet Take 0.6 mg by mouth as needed.     [provider]    Review of Systems  Constitutional: Positive for fatigue (with minimal exertion). Negative for appetite change and fever.  HENT: Positive for rhinorrhea and tinnitus. Negative for congestion, ear pain, postnasal drip, sinus pressure and sore throat.   Eyes: Negative.   Respiratory: Positive for shortness of breath (with minimal exertion). Negative for cough and  chest tightness.   Cardiovascular: Negative for chest pain, palpitations and leg swelling.  Gastrointestinal: Negative for abdominal distention and abdominal pain.  Endocrine: Negative.   Genitourinary: Negative.   Musculoskeletal: Negative for back pain and neck pain.  Skin: Negative.   Allergic/Immunologic: Negative.   Neurological: Negative for dizziness and light-headedness.  Hematological: Negative for adenopathy. Bruises/bleeds easily.  Psychiatric/Behavioral: Negative for dysphoric mood, sleep disturbance (wearing CPAP at night. sleeping on 1 pillow) and suicidal ideas. The patient is not nervous/anxious.    Vitals:   10/18/19 1223  BP: 109/62  Pulse: 70  Resp: 18  SpO2: 98%  Weight: 230 lb (104.3 kg)  Height: 6\' 2"  (1.88 m)   Wt Readings from Last 3 Encounters:  10/18/19 230 lb (104.3 kg)  10/12/19 238 lb 1.6 oz (108 kg)  10/05/19 238 lb 1.6 oz (108 kg)   Lab Results  Component Value Date   CREATININE 1.42 (H) 10/05/2019   CREATININE 1.59 (H) 10/04/2019    CREATININE 1.70 (H) 10/03/2019    Physical Exam  Constitutional: He is oriented to person, place, and time. He appears well-developed and well-nourished.  HENT:  Head: Normocephalic and atraumatic.  Eyes: Pupils are equal, round, and reactive to light. Conjunctivae are normal.  Neck: Normal range of motion. Neck supple. No JVD present.  Cardiovascular: Normal rate and regular rhythm.  Pulmonary/Chest: Effort normal. He has no wheezes. He has no rales.  Abdominal: Soft. He exhibits no distension. There is no abdominal tenderness.  Musculoskeletal:        General: No tenderness or edema.  Neurological: He is alert and oriented to person, place, and time.  Skin: Skin is warm and dry.  Psychiatric: He has a normal mood and affect. His behavior is normal. Thought content normal.  Nursing note and vitals reviewed.  Assessment & Plan:  1: Chronic heart failure with reduced ejection fraction- - NYHA class III - euvolemic today - weighing daily at home. Reminded to call for an overnight weight gain of >2 pounds or a weekly weight gain of >5 pounds - weight down 18 pounds from last visit 3 weeks ago - not adding salt and trying to follow a 2000mg  sodium diet - saw cardiologoly Nehemiah Massed) earlier today - BNP from 09/30/2019 was 1277.0 - saw pulmonologist Alva Garnet) 02/02/18.  - previously referred to paramedicine program but they haven't connected yet; he says that he's gotten her messages but hasn't taken the time to call her back yet - received his flu vaccine for this season - consider farxiga at future visits  2: Hypotension- - BP looks good today although no room for entresto - saw PCP Derrel Nip) 09/17/2019 - BMP from 10/05/2019 reviewed and shows sodium 131, potassium 3.6, creatinine 1.42, and GFR 47  3: Chronic kidney disease stage 4- - renal function has improved some  Medication bottles were reviewed.   He sees PCP in a couple of weeks and cardiology in December 2020 so will have  patient return here January 2021 or sooner for any questions/problems before then.

## 2019-10-18 ENCOUNTER — Telehealth: Payer: Self-pay

## 2019-10-18 ENCOUNTER — Encounter: Payer: Self-pay | Admitting: Family

## 2019-10-18 ENCOUNTER — Other Ambulatory Visit: Payer: Self-pay

## 2019-10-18 ENCOUNTER — Ambulatory Visit (INDEPENDENT_AMBULATORY_CARE_PROVIDER_SITE_OTHER): Payer: Medicare Other

## 2019-10-18 ENCOUNTER — Ambulatory Visit: Payer: Medicare Other | Attending: Family | Admitting: Family

## 2019-10-18 VITALS — BP 109/62 | HR 70 | Resp 18 | Ht 74.0 in | Wt 230.0 lb

## 2019-10-18 DIAGNOSIS — Z800 Family history of malignant neoplasm of digestive organs: Secondary | ICD-10-CM | POA: Diagnosis not present

## 2019-10-18 DIAGNOSIS — Z951 Presence of aortocoronary bypass graft: Secondary | ICD-10-CM | POA: Diagnosis not present

## 2019-10-18 DIAGNOSIS — J449 Chronic obstructive pulmonary disease, unspecified: Secondary | ICD-10-CM | POA: Insufficient documentation

## 2019-10-18 DIAGNOSIS — Z79899 Other long term (current) drug therapy: Secondary | ICD-10-CM | POA: Insufficient documentation

## 2019-10-18 DIAGNOSIS — I10 Essential (primary) hypertension: Secondary | ICD-10-CM | POA: Diagnosis not present

## 2019-10-18 DIAGNOSIS — I130 Hypertensive heart and chronic kidney disease with heart failure and stage 1 through stage 4 chronic kidney disease, or unspecified chronic kidney disease: Secondary | ICD-10-CM | POA: Insufficient documentation

## 2019-10-18 DIAGNOSIS — I255 Ischemic cardiomyopathy: Secondary | ICD-10-CM | POA: Diagnosis not present

## 2019-10-18 DIAGNOSIS — Z836 Family history of other diseases of the respiratory system: Secondary | ICD-10-CM | POA: Insufficient documentation

## 2019-10-18 DIAGNOSIS — Z23 Encounter for immunization: Secondary | ICD-10-CM

## 2019-10-18 DIAGNOSIS — Z95810 Presence of automatic (implantable) cardiac defibrillator: Secondary | ICD-10-CM | POA: Insufficient documentation

## 2019-10-18 DIAGNOSIS — I4891 Unspecified atrial fibrillation: Secondary | ICD-10-CM | POA: Diagnosis not present

## 2019-10-18 DIAGNOSIS — Z8546 Personal history of malignant neoplasm of prostate: Secondary | ICD-10-CM | POA: Insufficient documentation

## 2019-10-18 DIAGNOSIS — G4733 Obstructive sleep apnea (adult) (pediatric): Secondary | ICD-10-CM | POA: Insufficient documentation

## 2019-10-18 DIAGNOSIS — N184 Chronic kidney disease, stage 4 (severe): Secondary | ICD-10-CM | POA: Diagnosis not present

## 2019-10-18 DIAGNOSIS — I5022 Chronic systolic (congestive) heart failure: Secondary | ICD-10-CM | POA: Insufficient documentation

## 2019-10-18 DIAGNOSIS — I25810 Atherosclerosis of coronary artery bypass graft(s) without angina pectoris: Secondary | ICD-10-CM | POA: Diagnosis not present

## 2019-10-18 DIAGNOSIS — I252 Old myocardial infarction: Secondary | ICD-10-CM | POA: Insufficient documentation

## 2019-10-18 DIAGNOSIS — Z8249 Family history of ischemic heart disease and other diseases of the circulatory system: Secondary | ICD-10-CM | POA: Insufficient documentation

## 2019-10-18 DIAGNOSIS — I950 Idiopathic hypotension: Secondary | ICD-10-CM

## 2019-10-18 DIAGNOSIS — Z7901 Long term (current) use of anticoagulants: Secondary | ICD-10-CM | POA: Diagnosis not present

## 2019-10-18 DIAGNOSIS — I959 Hypotension, unspecified: Secondary | ICD-10-CM | POA: Diagnosis not present

## 2019-10-18 DIAGNOSIS — E785 Hyperlipidemia, unspecified: Secondary | ICD-10-CM | POA: Insufficient documentation

## 2019-10-18 DIAGNOSIS — I2510 Atherosclerotic heart disease of native coronary artery without angina pectoris: Secondary | ICD-10-CM | POA: Diagnosis not present

## 2019-10-18 DIAGNOSIS — Z8042 Family history of malignant neoplasm of prostate: Secondary | ICD-10-CM | POA: Insufficient documentation

## 2019-10-18 DIAGNOSIS — K219 Gastro-esophageal reflux disease without esophagitis: Secondary | ICD-10-CM | POA: Diagnosis not present

## 2019-10-18 DIAGNOSIS — I480 Paroxysmal atrial fibrillation: Secondary | ICD-10-CM | POA: Diagnosis not present

## 2019-10-18 NOTE — Patient Instructions (Signed)
Continue weighing daily and call for an overnight weight gain of > 2 pounds or a weekly weight gain of >5 pounds. 

## 2019-10-18 NOTE — Telephone Encounter (Signed)
SIGNED.  THE REST OF THE FORM SHOULD BE FILLED OUT BY A NON PHYSICIAN SO I HAVE ASKED NINA  TO DO IT,

## 2019-10-18 NOTE — Telephone Encounter (Signed)
Patient dropped off an application for a disability license plate while at his nurse visit today.  Pt said he is about to purchase a vehicle.  Pt requesting form to be completed by Dr. Derrel Nip and mailed to his home address once completed.  Application placed in Dr. Ulice Dash Quick Sign folder.

## 2019-10-18 NOTE — Telephone Encounter (Signed)
Application mailed to pt per pt request.  A copy was sent to scan.  Called and left message to make pt aware.  (ok per DPR).

## 2019-10-19 ENCOUNTER — Other Ambulatory Visit: Payer: Self-pay | Admitting: *Deleted

## 2019-10-19 ENCOUNTER — Encounter: Payer: Self-pay | Admitting: *Deleted

## 2019-10-19 NOTE — Patient Outreach (Signed)
[facility]) Care Management  10/19/2019  Rick Gilmore 09-May-1941 [id number]    Telephone Assessment-Successful HF  RN spoke with pt today and received an update and completed the missing elements for the initial assessment. Pt reports a successful ICD replacement and he is feeling much better. States he was on home O2 however weaned off and awaiting for pick up of this DME. Pt able to recite all upcoming appointments with his providers over the next few weeks. Pt has been attending all appointments with sufficient transportation.Pt states he only problems is his breathing. States he is using an IS to exercise his lungs to "fully expand his lungs". No other issues mentioned at this time.   RN able to update the plan of care as pt reports he is in the GREEN zone with weights staying around 228 lbs. RN verified no weight gain of 3 lbs overnight or 5 lbs within one week with fluid retention noted. Pt states he has loss a total of 41 lbs since Sept and having the replacement ICD. Pt verified he received the EMMI packet of information on HF however has not had time to review all the material. RN stress the importance of awareness of the other zones for quick interventions if symptoms are presented. RN educated pt once again on the HF zones and when to contact his provider with acute symptoms. RN has update the plan of care accordingly based upon pt's progress and will continue to review this information on the next call. Will continue to be available if needed as pt continue to recovery from his procedure.   THN CM Care Plan Problem One     Most Recent Value  Care Plan Problem One  Recent hospitalization with HF  Role Documenting the Problem One  Care Management Telephonic Coordinator  Care Plan for Problem One  Active  THN Long Term Goal   Pt will verbalized the action plan in the GREEN zone within the next 90 days.  THN Long Term Goal Start Date  09/26/19  Interventions for  Problem One Long Term Goal  WIll verify pt received the education material MMI and encouraged pt ot review. Will continue to educate on HF zones and verified pt remainsin the GREEN zone. Will continue ot stress lowering the risk of readmission with adherence to this plan of care.   THN CM Short Term Goal #1   Pt will complete daily weights and document all readings within the next 30 days.  THN CM Short Term Goal #1 Start Date  09/26/19  Interventions for Short Term Goal #1  Will extend to allow ongoing adherence post ICD replacement on pt perform this goal daily. Will encouraged ongoing adherence with weights and review what to do if acute symptoms or fluid retention occurs.  THN CM Short Term Goal #2   Adherence with medical appointments over the next 30 days.  THN CM Short Term Goal #2 Start Date  09/26/19  Interventions for Short Term Goal #2  Will extend to allow adherence with upcoming  appointments      Raina Mina, RN Care Management Coordinator [place] Office [phone]

## 2019-10-23 DIAGNOSIS — Z95810 Presence of automatic (implantable) cardiac defibrillator: Secondary | ICD-10-CM

## 2019-10-23 DIAGNOSIS — C61 Malignant neoplasm of prostate: Secondary | ICD-10-CM

## 2019-10-23 DIAGNOSIS — Z950 Presence of cardiac pacemaker: Secondary | ICD-10-CM

## 2019-10-23 DIAGNOSIS — Z7901 Long term (current) use of anticoagulants: Secondary | ICD-10-CM

## 2019-10-23 DIAGNOSIS — N183 Chronic kidney disease, stage 3 unspecified: Secondary | ICD-10-CM

## 2019-10-23 DIAGNOSIS — E538 Deficiency of other specified B group vitamins: Secondary | ICD-10-CM | POA: Diagnosis not present

## 2019-10-23 DIAGNOSIS — I5023 Acute on chronic systolic (congestive) heart failure: Secondary | ICD-10-CM | POA: Diagnosis not present

## 2019-10-23 DIAGNOSIS — D696 Thrombocytopenia, unspecified: Secondary | ICD-10-CM

## 2019-10-23 DIAGNOSIS — Z79899 Other long term (current) drug therapy: Secondary | ICD-10-CM

## 2019-10-23 DIAGNOSIS — I2510 Atherosclerotic heart disease of native coronary artery without angina pectoris: Secondary | ICD-10-CM | POA: Diagnosis not present

## 2019-10-23 DIAGNOSIS — G4733 Obstructive sleep apnea (adult) (pediatric): Secondary | ICD-10-CM

## 2019-10-23 DIAGNOSIS — D638 Anemia in other chronic diseases classified elsewhere: Secondary | ICD-10-CM | POA: Diagnosis not present

## 2019-10-23 DIAGNOSIS — I255 Ischemic cardiomyopathy: Secondary | ICD-10-CM | POA: Diagnosis not present

## 2019-10-23 DIAGNOSIS — K219 Gastro-esophageal reflux disease without esophagitis: Secondary | ICD-10-CM | POA: Diagnosis not present

## 2019-10-23 DIAGNOSIS — K7469 Other cirrhosis of liver: Secondary | ICD-10-CM

## 2019-10-23 DIAGNOSIS — Z951 Presence of aortocoronary bypass graft: Secondary | ICD-10-CM

## 2019-10-23 DIAGNOSIS — I130 Hypertensive heart and chronic kidney disease with heart failure and stage 1 through stage 4 chronic kidney disease, or unspecified chronic kidney disease: Secondary | ICD-10-CM | POA: Diagnosis not present

## 2019-10-23 DIAGNOSIS — J449 Chronic obstructive pulmonary disease, unspecified: Secondary | ICD-10-CM

## 2019-10-26 DIAGNOSIS — E875 Hyperkalemia: Secondary | ICD-10-CM | POA: Diagnosis not present

## 2019-10-26 DIAGNOSIS — R7989 Other specified abnormal findings of blood chemistry: Secondary | ICD-10-CM | POA: Diagnosis not present

## 2019-10-27 ENCOUNTER — Emergency Department
Admission: EM | Admit: 2019-10-27 | Discharge: 2019-10-27 | Disposition: A | Discharge status: Other Acute Inpatient Hospital | Payer: Medicare Other | Attending: Emergency Medicine | Admitting: Emergency Medicine

## 2019-10-27 ENCOUNTER — Other Ambulatory Visit: Payer: Self-pay

## 2019-10-27 ENCOUNTER — Emergency Department: Payer: Medicare Other

## 2019-10-27 ENCOUNTER — Encounter: Payer: Self-pay | Admitting: Emergency Medicine

## 2019-10-27 DIAGNOSIS — Z20828 Contact with and (suspected) exposure to other viral communicable diseases: Secondary | ICD-10-CM | POA: Insufficient documentation

## 2019-10-27 DIAGNOSIS — J9811 Atelectasis: Secondary | ICD-10-CM | POA: Diagnosis not present

## 2019-10-27 DIAGNOSIS — T827XXA Infection and inflammatory reaction due to other cardiac and vascular devices, implants and grafts, initial encounter: Secondary | ICD-10-CM | POA: Insufficient documentation

## 2019-10-27 DIAGNOSIS — I252 Old myocardial infarction: Secondary | ICD-10-CM | POA: Diagnosis not present

## 2019-10-27 DIAGNOSIS — I999 Unspecified disorder of circulatory system: Secondary | ICD-10-CM | POA: Diagnosis not present

## 2019-10-27 DIAGNOSIS — I110 Hypertensive heart disease with heart failure: Secondary | ICD-10-CM | POA: Diagnosis not present

## 2019-10-27 DIAGNOSIS — I5022 Chronic systolic (congestive) heart failure: Secondary | ICD-10-CM | POA: Insufficient documentation

## 2019-10-27 DIAGNOSIS — Z950 Presence of cardiac pacemaker: Secondary | ICD-10-CM | POA: Insufficient documentation

## 2019-10-27 DIAGNOSIS — R0602 Shortness of breath: Secondary | ICD-10-CM | POA: Diagnosis not present

## 2019-10-27 DIAGNOSIS — I2510 Atherosclerotic heart disease of native coronary artery without angina pectoris: Secondary | ICD-10-CM | POA: Diagnosis not present

## 2019-10-27 DIAGNOSIS — Z95810 Presence of automatic (implantable) cardiac defibrillator: Secondary | ICD-10-CM | POA: Diagnosis not present

## 2019-10-27 DIAGNOSIS — Y628 Failure of sterile precautions during other surgical and medical care: Secondary | ICD-10-CM | POA: Diagnosis not present

## 2019-10-27 DIAGNOSIS — J449 Chronic obstructive pulmonary disease, unspecified: Secondary | ICD-10-CM | POA: Insufficient documentation

## 2019-10-27 DIAGNOSIS — J90 Pleural effusion, not elsewhere classified: Secondary | ICD-10-CM | POA: Diagnosis not present

## 2019-10-27 DIAGNOSIS — I4821 Permanent atrial fibrillation: Secondary | ICD-10-CM | POA: Diagnosis not present

## 2019-10-27 LAB — URINALYSIS, COMPLETE (UACMP) WITH MICROSCOPIC
Bacteria, UA: NONE SEEN
Bilirubin Urine: NEGATIVE
Glucose, UA: NEGATIVE mg/dL
Hgb urine dipstick: NEGATIVE
Ketones, ur: NEGATIVE mg/dL
Leukocytes,Ua: NEGATIVE
Nitrite: NEGATIVE
Protein, ur: NEGATIVE mg/dL
Specific Gravity, Urine: 1.016 (ref 1.005–1.030)
Squamous Epithelial / HPF: NONE SEEN (ref 0–5)
pH: 5 (ref 5.0–8.0)

## 2019-10-27 LAB — CBC WITH DIFFERENTIAL/PLATELET
Abs Immature Granulocytes: 0.07 10*3/uL (ref 0.00–0.07)
Basophils Absolute: 0 10*3/uL (ref 0.0–0.1)
Basophils Relative: 0 %
Eosinophils Absolute: 0 10*3/uL (ref 0.0–0.5)
Eosinophils Relative: 0 %
HCT: 31.7 % — ABNORMAL LOW (ref 39.0–52.0)
Hemoglobin: 10.1 g/dL — ABNORMAL LOW (ref 13.0–17.0)
Immature Granulocytes: 1 %
Lymphocytes Relative: 9 %
Lymphs Abs: 0.8 10*3/uL (ref 0.7–4.0)
MCH: 29.7 pg (ref 26.0–34.0)
MCHC: 31.9 g/dL (ref 30.0–36.0)
MCV: 93.2 fL (ref 80.0–100.0)
Monocytes Absolute: 0.8 10*3/uL (ref 0.1–1.0)
Monocytes Relative: 9 %
Neutro Abs: 7.5 10*3/uL (ref 1.7–7.7)
Neutrophils Relative %: 81 %
Platelets: 112 10*3/uL — ABNORMAL LOW (ref 150–400)
RBC: 3.4 MIL/uL — ABNORMAL LOW (ref 4.22–5.81)
RDW: 16.5 % — ABNORMAL HIGH (ref 11.5–15.5)
WBC: 9.3 10*3/uL (ref 4.0–10.5)
nRBC: 0 % (ref 0.0–0.2)

## 2019-10-27 LAB — COMPREHENSIVE METABOLIC PANEL
ALT: 21 U/L (ref 0–44)
AST: 38 U/L (ref 15–41)
Albumin: 3.4 g/dL — ABNORMAL LOW (ref 3.5–5.0)
Alkaline Phosphatase: 109 U/L (ref 38–126)
Anion gap: 13 (ref 5–15)
BUN: 28 mg/dL — ABNORMAL HIGH (ref 8–23)
CO2: 21 mmol/L — ABNORMAL LOW (ref 22–32)
Calcium: 8.7 mg/dL — ABNORMAL LOW (ref 8.9–10.3)
Chloride: 93 mmol/L — ABNORMAL LOW (ref 98–111)
Creatinine, Ser: 1.7 mg/dL — ABNORMAL HIGH (ref 0.61–1.24)
GFR calc Af Amer: 44 mL/min — ABNORMAL LOW (ref >60)
GFR calc non Af Amer: 38 mL/min — ABNORMAL LOW (ref >60)
Glucose, Bld: 123 mg/dL — ABNORMAL HIGH (ref 70–99)
Potassium: 4.6 mmol/L (ref 3.5–5.1)
Sodium: 127 mmol/L — ABNORMAL LOW (ref 135–145)
Total Bilirubin: 1.3 mg/dL — ABNORMAL HIGH (ref 0.3–1.2)
Total Protein: 7.2 g/dL (ref 6.5–8.1)

## 2019-10-27 LAB — PROTIME-INR
INR: 1.8 — ABNORMAL HIGH (ref 0.8–1.2)
Prothrombin Time: 20.4 seconds — ABNORMAL HIGH (ref 11.4–15.2)

## 2019-10-27 LAB — LACTIC ACID, PLASMA: Lactic Acid, Venous: 3.6 mmol/L (ref 0.5–1.9)

## 2019-10-27 MED ORDER — PIPERACILLIN-TAZOBACTAM 3.375 G IVPB 30 MIN
3.3750 g | Freq: Once | INTRAVENOUS | Status: AC
  Administered 2019-10-27: 3.375 g via INTRAVENOUS
  Filled 2019-10-27: qty 50

## 2019-10-27 MED ORDER — SODIUM CHLORIDE 0.9 % IV BOLUS
500.0000 mL | Freq: Once | INTRAVENOUS | Status: AC
  Administered 2019-10-27: 500 mL via INTRAVENOUS

## 2019-10-27 MED ORDER — VANCOMYCIN HCL IN DEXTROSE 1-5 GM/200ML-% IV SOLN
1000.0000 mg | Freq: Once | INTRAVENOUS | Status: AC
  Administered 2019-10-27: 1000 mg via INTRAVENOUS
  Filled 2019-10-27: qty 200

## 2019-10-27 MED ORDER — SODIUM CHLORIDE 0.9% FLUSH: 3.0000 mL | Freq: Once | INTRAVENOUS | Status: DC

## 2019-10-27 NOTE — ED Notes (Signed)
FIRST NURSE NOTE:  Pt here with family reports pt continues to have bleeding present where pacemaker was placed over 1 month ago.  No distress noted on arrival.

## 2019-10-27 NOTE — ED Notes (Signed)
Pt desatting down to 87%- pt normally wears a cpap at night- pt placed on 2L O2 so he could take a nap

## 2019-10-27 NOTE — ED Notes (Signed)
Dr Williams at bedside 

## 2019-10-27 NOTE — ED Provider Notes (Signed)
EKG: Interpreted by me, ventricular paced rhythm with rate of 70 bpm, normal pacemaker function is noted   Earleen Newport, MD 10/27/19 2104

## 2019-10-27 NOTE — ED Notes (Signed)
Report given to Linna Hoff with [employer].

## 2019-10-27 NOTE — ED Notes (Signed)
Pt given peanut butter and crackers and ice cream with the ok of Dr Jimmye Norman. Pt understanding about awaiting to go to [facility].

## 2019-10-27 NOTE — ED Notes (Signed)
Lab called with lactic acid of 3.6- will notify MD

## 2019-10-27 NOTE — ED Notes (Signed)
Pt given update on transfer

## 2019-10-27 NOTE — ED Triage Notes (Signed)
Had defibrillator placed upper L chest oct 27th, noted bleeding from site since yesterday. Noted to be oozing blood today.  SOB in triage but states this in normal for him.

## 2019-10-27 NOTE — ED Notes (Signed)
Pt given urinal to use, declines help, pt states he already contacted his daughter to come in to visit with him.

## 2019-10-27 NOTE — ED Notes (Signed)
Dr Jimmye Norman at bedside- pt given blanket

## 2019-10-27 NOTE — ED Provider Notes (Addendum)
[facility] Emergency Department Provider Note       Time seen: ----------------------------------------- 3:26 PM on 10/27/2019 -----------------------------------------   I have reviewed the triage vital signs and the nursing notes.  HISTORY   Chief Complaint Post-op Problem    HPI Rick Gilmore is a 78 y.o. male with a history of defibrillator, anemia, arrhythmia, atrial fibrillation, coronary artery disease, CHF, COPD, GERD, hypertension, hyperlipidemia who presents to the ED for wound drainage from a pacemaker defibrillator placement site.  Patient had a defibrillator placed in his left chest on October 27, noted bleeding from the site since yesterday.  It was noted to be oozing blood yesterday.  Patient states he is chronically short of breath, has some soreness over the defibrillator site.  Past Medical History:  Diagnosis Date  . AICD (automatic cardioverter/defibrillator) present   . Anemia   . Arrhythmia    a fib  . Atrial fibrillation ([place])    on Coumadin  . CAD (coronary artery disease)   . Cancer [facility]) 2001   Prostate, XRT and implant  . CHF (congestive heart failure) ([place])   . COPD (chronic obstructive pulmonary disease) ([place])   . Erectile dysfunction   . GERD (gastroesophageal reflux disease)   . Hyperlipidemia   . Hypertension   . Ischemic cardiomyopathy    s/p AICD/pacer 2009, replaced 2010 [facility]  . MI (myocardial infarction) ([place]) 1988  . Personal history of prostate cancer 2001   s/p XRT and implant ([place])  . Prostate cancer ([place])   . Sleep apnea, obstructive    uses CPAP nightly  . Thrombocytopenia ([place])   . Tubular adenoma    colon polyp  . Vitamin B 12 deficiency     Patient Active Problem List   Diagnosis Date Noted  . Pleural effusion due to CHF (congestive heart failure) ([place]) 09/17/2019  . Hospital discharge follow-up 08/19/2019  . V tach ([place]) 08/09/2019  . Defibrillator discharge 08/09/2019  . V-tach ([place])  08/09/2019  . 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 Portal hypertension ([place]) 07/19/2019  . 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 Obesity 04/20/2016  . Hearing loss of left ear due to cerumen impaction 04/19/2016  . Hypotension 03/10/2016  . Allergic rhinitis 02/02/2016  . Acute on chronic systolic CHF (congestive heart failure) ([place]) 01/16/2016  . Chronic renal insufficiency 01/16/2016  . Prediabetes 01/16/2016  . 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 S/P implantation of automatic cardioverter/defibrillator (AICD) 10/21/2015  . Diverticulosis of colon without hemorrhage 10/21/2015  . Internal hemorrhoids 10/21/2015  . Atrial fibrillation ([place]) 10/15/2015  . Tubular adenoma of colon 06/04/2015  . COPD (chronic obstructive pulmonary disease) ([place]) 04/20/2015  . Iron deficiency anemia 04/13/2015  . Thrombocytopenia ([place]) 12/01/2014  . Hyperlipidemia LDL goal <70 12/01/2014  . Chronic systolic heart failure ([place]) 10/08/2014  . Long term current use of anticoagulant therapy 11/02/2011  . Pernicious anemia 11/02/2011  . Ischemic cardiomyopathy   . Sleep apnea, obstructive     Past Surgical History:  Procedure Laterality Date  . CARDIAC CATHETERIZATION    . COLONOSCOPY WITH PROPOFOL [name] 05/30/2015   Procedure: COLONOSCOPY WITH PROPOFOL;  Surgeon: Manya Silvas, MD;  Location: [facility] ENDOSCOPY;  Service: Endoscopy;  Laterality: [name];  . COLONOSCOPY WITH PROPOFOL [name] 07/16/2019   Procedure: COLONOSCOPY WITH PROPOFOL;  Surgeon: Toledo, Benay Pike, MD;  Location: [facility] ENDOSCOPY;  Service: Gastroenterology;  Laterality: [name];  . CORONARY ARTERY BYPASS GRAFT  1988  . ESOPHAGOGASTRODUODENOSCOPY [name] 05/30/2015  Procedure: ESOPHAGOGASTRODUODENOSCOPY (EGD);  Surgeon: Manya Silvas, MD;  Location: [facility] ENDOSCOPY;  Service: Endoscopy;  Laterality: [name];  . ESOPHAGOGASTRODUODENOSCOPY (EGD) WITH PROPOFOL [name] 07/16/2019   Procedure: ESOPHAGOGASTRODUODENOSCOPY (EGD) WITH PROPOFOL;   Surgeon: Toledo, Benay Pike, MD;  Location: [facility] ENDOSCOPY;  Service: Gastroenterology;  Laterality: [name];  . IMPLANTABLE CARDIOVERTER DEFIBRILLATOR (ICD) GENERATOR CHANGE Left 10/02/2019   Procedure: ICD GENERATOR CHANGE;  Surgeon: Isaias Cowman, MD;  Location: [facility] ORS;  Service: Cardiovascular;  Laterality: Left;  . IMPLANTABLE CARDIOVERTER DEFIBRILLATOR GENERATOR CHANGE  April 2014   Bi V RRR  . INSERT / REPLACE / REMOVE PACEMAKER  2014  . VASECTOMY      Allergies Patient has no known allergies.  Social History Social History   Tobacco Use  . Smoking status: Never Smoker  . Smokeless tobacco: Never Used  Substance Use Topics  . Alcohol use: Yes    Alcohol/week: 4.0 standard drinks    Types: 2 Standard drinks or equivalent, 2 Cans of beer per week    Comment: Mixed drinks  . Drug use: No   Review of Systems Constitutional: Negative for fever. Cardiovascular: Negative for chest pain. Respiratory: Negative for shortness of breath. Gastrointestinal: Negative for abdominal pain, vomiting and diarrhea. Musculoskeletal: Negative for back pain. Skin: Positive for bleeding from a left chest wound Neurological: Negative for headaches, focal weakness or numbness.  All systems negative/normal/unremarkable except as stated in the HPI  ____________________________________________   PHYSICAL EXAM:  VITAL SIGNS: ED Triage Vitals  Enc Vitals Group     BP 10/27/19 1407 118/90     Pulse Rate 10/27/19 1407 80     Resp 10/27/19 1407 20     Temp 10/27/19 1407 99.1 F (37.3 C)     Temp Source 10/27/19 1407 Oral     SpO2 10/27/19 1407 97 %     Weight 10/27/19 1408 223 lb (101.2 kg)     Height 10/27/19 1408 6\' 2"  (1.88 m)     Head Circumference --      Peak Flow --      Pain Score 10/27/19 1407 0     Pain Loc --      Pain Edu? --      Excl. in [place]? --    Constitutional: Alert and oriented.  Chronically ill-appearing, no distress Eyes: Conjunctivae are normal. Normal  extraocular movements. Cardiovascular: Normal rate, regular rhythm. No murmurs, rubs, or gallops. Respiratory: Mild tachypnea with grossly clear breath sounds Gastrointestinal: Soft and nontender. Normal bowel sounds Musculoskeletal: Nontender with normal range of motion in extremities. No lower extremity tenderness nor edema. Neurologic:  Normal speech and language. No gross focal neurologic deficits are appreciated.  Skin: Pacemaker and defibrillator site is not erythematous, Steri-Strips were removed by me.  There is bleeding coming from the most lateral aspect of the insertion site.  Copious purulent material began to drain from the incision site laterally with slight pressure. Psychiatric: Mood and affect are normal. Speech and behavior are normal.  ____________________________________________  ED COURSE:  As part of my medical decision making, I reviewed the following data within the [place] History obtained from family if available, nursing notes, old chart and ekg, as well as notes from prior ED visits. Patient presented for bleeding from a defibrillator site, we will assess with labs as indicated at this time.   Marland Kitchen.Incision and Drainage  Date/Time: 10/27/2019 3:31 PM Performed by: Earleen Newport, MD Authorized by: Earleen Newport, MD   Consent:  Consent obtained:  Verbal   Consent given by:  Patient Location:    Type:  Abscess   Size:  Moderate   Location:  Trunk   Trunk location:  Chest Pre-procedure details:    Skin preparation:  Betadine Anesthesia (see MAR for exact dosages):    Anesthesia method:  None Procedure type:    Complexity:  Simple Procedure details:    Needle aspiration: no      Demitrious Mccannon was evaluated in Emergency Department on 10/27/2019 for the symptoms described in the history of present illness. He was evaluated in the context of the global COVID-19 pandemic, which necessitated consideration that the patient  might be at risk for infection with the SARS-CoV-2 virus that causes COVID-19. Institutional protocols and algorithms that pertain to the evaluation of patients at risk for COVID-19 are in a state of rapid change based on information released by regulatory bodies including the CDC and federal and state organizations. These policies and algorithms were followed during the patient's care in the ED.  ____________________________________________   LABS (pertinent positives/negatives)  Labs Reviewed  LACTIC ACID, PLASMA - Abnormal; Notable for the following components:      Result Value   Lactic Acid, Venous 3.6 (*)    All other components within normal limits  COMPREHENSIVE METABOLIC PANEL - Abnormal; Notable for the following components:   Sodium 127 (*)    Chloride 93 (*)    CO2 21 (*)    Glucose, Bld 123 (*)    BUN 28 (*)    Creatinine, Ser 1.70 (*)    Calcium 8.7 (*)    Albumin 3.4 (*)    Total Bilirubin 1.3 (*)    GFR calc non Af Amer 38 (*)    GFR calc Af Amer 44 (*)    All other components within normal limits  CBC WITH DIFFERENTIAL/PLATELET - Abnormal; Notable for the following components:   RBC 3.40 (*)    Hemoglobin 10.1 (*)    HCT 31.7 (*)    RDW 16.5 (*)    Platelets 112 (*)    All other components within normal limits  URINALYSIS, COMPLETE (UACMP) WITH MICROSCOPIC - Abnormal; Notable for the following components:   Color, Urine YELLOW (*)    APPearance HAZY (*)    All other components within normal limits  PROTIME-INR - Abnormal; Notable for the following components:   Prothrombin Time 20.4 (*)    INR 1.8 (*)    All other components within normal limits  AEROBIC/ANAEROBIC CULTURE (SURGICAL/DEEP WOUND)  CULTURE, BLOOD (ROUTINE X 2)  CULTURE, BLOOD (ROUTINE X 2)  SARS CORONAVIRUS 2 (TAT 6-24 HRS)  LACTIC ACID, PLASMA   ____________________________________________   DIFFERENTIAL DIAGNOSIS   Wound infection, abscess, cellulitis, hardware infection  FINAL  ASSESSMENT AND PLAN  Wound infection, abscess   Plan: The patient had presented for purulent drainage from his defibrillator site. Patient's labs did not reveal leukocytosis but he does have a lactic acidosis.  Unsure if this is acute or chronic.  He does not clinically appear septic but does have a bad wound infection.  Abscess was drained as dictated above and was spontaneously draining.  We have sent cultures and given broad-spectrum antibiotics.  I will discuss with cardiology, he will likely need IV antibiotics and observation.    Cardiology would prefer transfer to [place] in case he needs to have hardware removed.  I have discussed with the [facility] transfer center, patient has been accepted by Dr. Marcello Moores.  Laurence Aly, MD    Note: This note was generated in part or whole with voice recognition software. Voice recognition is usually quite accurate but there are transcription errors that can and very often do occur. I apologize for any typographical errors that were not detected and corrected.     Earleen Newport, MD 10/27/19 1530    Earleen Newport, MD 10/27/19 [phone]

## 2019-10-27 NOTE — ED Notes (Signed)
Report given to Anne,RN.

## 2019-10-28 DIAGNOSIS — B952 Enterococcus as the cause of diseases classified elsewhere: Secondary | ICD-10-CM | POA: Insufficient documentation

## 2019-10-28 DIAGNOSIS — Z95810 Presence of automatic (implantable) cardiac defibrillator: Secondary | ICD-10-CM | POA: Diagnosis not present

## 2019-10-28 DIAGNOSIS — R161 Splenomegaly, not elsewhere classified: Secondary | ICD-10-CM | POA: Diagnosis not present

## 2019-10-28 DIAGNOSIS — I5022 Chronic systolic (congestive) heart failure: Secondary | ICD-10-CM | POA: Diagnosis not present

## 2019-10-28 DIAGNOSIS — T827XXA Infection and inflammatory reaction due to other cardiac and vascular devices, implants and grafts, initial encounter: Secondary | ICD-10-CM | POA: Diagnosis not present

## 2019-10-28 DIAGNOSIS — J90 Pleural effusion, not elsewhere classified: Secondary | ICD-10-CM | POA: Diagnosis not present

## 2019-10-28 DIAGNOSIS — R918 Other nonspecific abnormal finding of lung field: Secondary | ICD-10-CM | POA: Diagnosis not present

## 2019-10-28 DIAGNOSIS — I4821 Permanent atrial fibrillation: Secondary | ICD-10-CM | POA: Diagnosis not present

## 2019-10-28 LAB — BLOOD CULTURE ID PANEL (REFLEXED)

## 2019-10-28 LAB — SARS CORONAVIRUS 2 (TAT 6-24 HRS): SARS Coronavirus 2: NEGATIVE

## 2019-10-28 NOTE — ED Notes (Signed)
Pt left a silver colored Yeti insulated tumbler with a red straw in the room. Patients daughter Isabelle Course called at [phone] and informed and will come by on Sunday 10/28/19 to pick up. Cup placed in plastic bag and pt ID label and daughters name and number on bag.

## 2019-10-28 NOTE — Progress Notes (Signed)
PHARMACY - PHYSICIAN COMMUNICATION CRITICAL VALUE ALERT - BLOOD CULTURE IDENTIFICATION (BCID)  Rick Gilmore is an 78 y.o. male who presented to [facility] on 10/27/2019 with a chief complaint of post-op problem from defibrillator site that is bleeding.  Assessment:  Tachypneic, hypotensive systolic mid 11'H, LA 3.6  CXR: IMPRESSION: 1. Stable cardiomegaly with vascular congestion, basilar atelectasis and small effusions. 2. No significant interval change.  2/4 GNR Enterobacteriaceae Enterobacter cloacae complex  Name of physician (or Provider) Contacted: Patient was transferred to [facility] -- culture called into Gastro[facility]ology [facility] covering 7300.  Results for orders placed or performed during the hospital encounter of 10/27/19  Blood Culture ID Panel (Reflexed) (Collected: 10/27/2019  3:44 PM)  Result Value Ref Range   Enterococcus species NOT DETECTED NOT DETECTED   Listeria monocytogenes NOT DETECTED NOT DETECTED   Staphylococcus species NOT DETECTED NOT DETECTED   Staphylococcus aureus (BCID) NOT DETECTED NOT DETECTED   Streptococcus species NOT DETECTED NOT DETECTED   Streptococcus agalactiae NOT DETECTED NOT DETECTED   Streptococcus pneumoniae NOT DETECTED NOT DETECTED   Streptococcus pyogenes NOT DETECTED NOT DETECTED   Acinetobacter baumannii NOT DETECTED NOT DETECTED   Enterobacteriaceae species DETECTED (A) NOT DETECTED   Enterobacter cloacae complex DETECTED (A) NOT DETECTED   Escherichia coli NOT DETECTED NOT DETECTED   Klebsiella oxytoca NOT DETECTED NOT DETECTED   Klebsiella pneumoniae NOT DETECTED NOT DETECTED   Proteus species NOT DETECTED NOT DETECTED   Serratia marcescens NOT DETECTED NOT DETECTED   Carbapenem resistance NOT DETECTED NOT DETECTED   Haemophilus influenzae NOT DETECTED NOT DETECTED   Neisseria meningitidis NOT DETECTED NOT DETECTED   Pseudomonas aeruginosa NOT DETECTED NOT DETECTED   Candida albicans NOT DETECTED NOT DETECTED   Candida  glabrata NOT DETECTED NOT DETECTED   Candida krusei NOT DETECTED NOT DETECTED   Candida parapsilosis NOT DETECTED NOT DETECTED   Candida tropicalis NOT DETECTED NOT DETECTED   Tobie Lords, PharmD, BCPS Clinical Pharmacist 10/28/2019  5:14 AM

## 2019-10-29 ENCOUNTER — Other Ambulatory Visit: Payer: Self-pay | Admitting: *Deleted

## 2019-10-29 ENCOUNTER — Telehealth: Payer: Self-pay | Admitting: Internal Medicine

## 2019-10-29 DIAGNOSIS — I4821 Permanent atrial fibrillation: Secondary | ICD-10-CM | POA: Diagnosis not present

## 2019-10-29 DIAGNOSIS — R161 Splenomegaly, not elsewhere classified: Secondary | ICD-10-CM | POA: Diagnosis not present

## 2019-10-29 DIAGNOSIS — T827XXA Infection and inflammatory reaction due to other cardiac and vascular devices, implants and grafts, initial encounter: Secondary | ICD-10-CM | POA: Diagnosis not present

## 2019-10-29 DIAGNOSIS — Z95810 Presence of automatic (implantable) cardiac defibrillator: Secondary | ICD-10-CM | POA: Diagnosis not present

## 2019-10-29 DIAGNOSIS — D7389 Other diseases of spleen: Secondary | ICD-10-CM | POA: Diagnosis not present

## 2019-10-29 DIAGNOSIS — I361 Nonrheumatic tricuspid (valve) insufficiency: Secondary | ICD-10-CM | POA: Diagnosis not present

## 2019-10-29 DIAGNOSIS — I5022 Chronic systolic (congestive) heart failure: Secondary | ICD-10-CM | POA: Diagnosis not present

## 2019-10-29 DIAGNOSIS — I340 Nonrheumatic mitral (valve) insufficiency: Secondary | ICD-10-CM | POA: Diagnosis not present

## 2019-10-29 NOTE — Patient Outreach (Signed)
[facility]) Care Management  10/29/2019  Rick Gilmore 1940-12-17 [id number]    Telephone Assessment  Pt was in ED on 11/21 and transported to [facility] on 11/21 due to his critical status.   RN able to speak with pt today with more details. Pt reports he has an infection to the operative site where the ICD was placed. Pt states he is receiving high potency antibiotics with plans remove the ICD with ongoing antibiotic will continue as the heart surgeon with be on stand-by with any anticipated problems.   Pt in good spirits however aware his status is critical. RN will continue to follow accordingly however if pt remains in patient for 10 days. Will follow protocol and close case accordingly.   Raina Mina, RN Care Management Coordinator [place] Office [phone]

## 2019-10-29 NOTE — Telephone Encounter (Signed)
Patient wanted Dr Derrel Nip to know he is in the hospital at [facility]. He stated that he has an infection in the incision where they replaced his pace maker.

## 2019-10-29 NOTE — Telephone Encounter (Signed)
FYI

## 2019-10-30 DIAGNOSIS — N189 Chronic kidney disease, unspecified: Secondary | ICD-10-CM | POA: Diagnosis not present

## 2019-10-30 DIAGNOSIS — B9689 Other specified bacterial agents as the cause of diseases classified elsewhere: Secondary | ICD-10-CM | POA: Diagnosis not present

## 2019-10-30 DIAGNOSIS — I130 Hypertensive heart and chronic kidney disease with heart failure and stage 1 through stage 4 chronic kidney disease, or unspecified chronic kidney disease: Secondary | ICD-10-CM | POA: Diagnosis not present

## 2019-10-30 DIAGNOSIS — R7881 Bacteremia: Secondary | ICD-10-CM | POA: Diagnosis not present

## 2019-10-30 DIAGNOSIS — I509 Heart failure, unspecified: Secondary | ICD-10-CM | POA: Diagnosis not present

## 2019-10-30 DIAGNOSIS — I4430 Unspecified atrioventricular block: Secondary | ICD-10-CM | POA: Diagnosis not present

## 2019-10-30 DIAGNOSIS — D631 Anemia in chronic kidney disease: Secondary | ICD-10-CM | POA: Diagnosis not present

## 2019-10-30 DIAGNOSIS — I361 Nonrheumatic tricuspid (valve) insufficiency: Secondary | ICD-10-CM | POA: Diagnosis not present

## 2019-10-30 DIAGNOSIS — T827XXD Infection and inflammatory reaction due to other cardiac and vascular devices, implants and grafts, subsequent encounter: Secondary | ICD-10-CM | POA: Diagnosis not present

## 2019-10-30 DIAGNOSIS — T82897A Other specified complication of cardiac prosthetic devices, implants and grafts, initial encounter: Secondary | ICD-10-CM | POA: Diagnosis not present

## 2019-10-30 DIAGNOSIS — I255 Ischemic cardiomyopathy: Secondary | ICD-10-CM | POA: Diagnosis not present

## 2019-10-30 DIAGNOSIS — T827XXA Infection and inflammatory reaction due to other cardiac and vascular devices, implants and grafts, initial encounter: Secondary | ICD-10-CM | POA: Diagnosis not present

## 2019-10-30 DIAGNOSIS — Z95810 Presence of automatic (implantable) cardiac defibrillator: Secondary | ICD-10-CM | POA: Diagnosis not present

## 2019-10-30 LAB — CULTURE, BLOOD (ROUTINE X 2): Special Requests: ADEQUATE

## 2019-10-31 ENCOUNTER — Ambulatory Visit: Payer: Medicare Other

## 2019-10-31 ENCOUNTER — Ambulatory Visit: Payer: Medicare Other | Admitting: Internal Medicine

## 2019-10-31 DIAGNOSIS — R7881 Bacteremia: Secondary | ICD-10-CM | POA: Diagnosis not present

## 2019-10-31 DIAGNOSIS — T827XXD Infection and inflammatory reaction due to other cardiac and vascular devices, implants and grafts, subsequent encounter: Secondary | ICD-10-CM | POA: Diagnosis not present

## 2019-10-31 DIAGNOSIS — Z95810 Presence of automatic (implantable) cardiac defibrillator: Secondary | ICD-10-CM | POA: Diagnosis not present

## 2019-10-31 MED ORDER — GENERIC EXTERNAL MEDICATION
1.00 | Status: DC
Start: 2019-11-06 — End: 2019-10-31

## 2019-10-31 MED ORDER — PANTOPRAZOLE SODIUM 40 MG PO TBEC
40.00 | DELAYED_RELEASE_TABLET | ORAL | Status: DC
Start: 2019-11-07 — End: 2019-10-31

## 2019-10-31 MED ORDER — LIDOCAINE HCL 1 % IJ SOLN
0.50 | INTRAMUSCULAR | Status: DC
Start: ? — End: 2019-10-31

## 2019-10-31 MED ORDER — AMIODARONE HCL 200 MG PO TABS
200.00 | ORAL_TABLET | ORAL | Status: DC
Start: 2019-11-07 — End: 2019-10-31

## 2019-10-31 MED ORDER — GENERIC EXTERNAL MEDICATION
1.00 | Status: DC
Start: ? — End: 2019-10-31

## 2019-10-31 MED ORDER — FLUTICASONE PROPIONATE 50 MCG/ACT NA SUSP
2.00 | NASAL | Status: DC
Start: 2019-11-07 — End: 2019-10-31

## 2019-10-31 MED ORDER — SPIRONOLACTONE 25 MG PO TABS
25.00 | ORAL_TABLET | ORAL | Status: DC
Start: 2019-10-31 — End: 2019-10-31

## 2019-10-31 MED ORDER — UMECLIDINIUM-VILANTEROL 62.5-25 MCG/INH IN AEPB
1.00 | INHALATION_SPRAY | RESPIRATORY_TRACT | Status: DC
Start: 2019-11-07 — End: 2019-10-31

## 2019-10-31 MED ORDER — ACETAMINOPHEN 325 MG PO TABS
650.00 | ORAL_TABLET | ORAL | Status: DC
Start: ? — End: 2019-10-31

## 2019-10-31 MED ORDER — PRAVASTATIN SODIUM 20 MG PO TABS
80.00 | ORAL_TABLET | ORAL | Status: DC
Start: 2019-11-06 — End: 2019-10-31

## 2019-10-31 MED ORDER — GENERIC EXTERNAL MEDICATION
1000.00 | Status: DC
Start: 2019-11-07 — End: 2019-10-31

## 2019-10-31 MED ORDER — ISOSORBIDE MONONITRATE ER 30 MG PO TB24
30.00 | ORAL_TABLET | ORAL | Status: DC
Start: 2019-11-07 — End: 2019-10-31

## 2019-10-31 MED ORDER — ALBUTEROL SULFATE HFA 108 (90 BASE) MCG/ACT IN AERS
2.00 | INHALATION_SPRAY | RESPIRATORY_TRACT | Status: DC
Start: ? — End: 2019-10-31

## 2019-10-31 MED ORDER — TORSEMIDE 20 MG PO TABS
20.00 | ORAL_TABLET | ORAL | Status: DC
Start: 2019-10-31 — End: 2019-10-31

## 2019-10-31 MED ORDER — METOPROLOL SUCCINATE ER 50 MG PO TB24
50.00 | ORAL_TABLET | ORAL | Status: DC
Start: 2019-11-07 — End: 2019-10-31

## 2019-10-31 MED ORDER — ESCITALOPRAM OXALATE 10 MG PO TABS
10.00 | ORAL_TABLET | ORAL | Status: DC
Start: 2019-11-07 — End: 2019-10-31

## 2019-11-01 LAB — AEROBIC/ANAEROBIC CULTURE W GRAM STAIN (SURGICAL/DEEP WOUND): Special Requests: NORMAL

## 2019-11-02 DIAGNOSIS — Z95810 Presence of automatic (implantable) cardiac defibrillator: Secondary | ICD-10-CM | POA: Diagnosis not present

## 2019-11-02 DIAGNOSIS — T827XXD Infection and inflammatory reaction due to other cardiac and vascular devices, implants and grafts, subsequent encounter: Secondary | ICD-10-CM | POA: Diagnosis not present

## 2019-11-02 DIAGNOSIS — R7881 Bacteremia: Secondary | ICD-10-CM | POA: Diagnosis not present

## 2019-11-02 NOTE — Progress Notes (Signed)
Brief Pharmacy Note  Patient is a 77 y/o M with medical hx including defibrillator, anemia, atrial fibrillation, coronary artery disease, CHF, COPD who presented to [facility] ED 11/21 c/o wound drainage from pacemaker site which was placed 10/27. Patient was subsequently transferred to [facility]. 11/21 Bcx with 3/4 bottles Enterobacter cloacae (cefazolin resistant). 11/21 Wound cx with same organism. Spoke with RN on floor where patient was transferred on 11/25 and faxed culture sensitivities to number provided.  [place] Resident 02 November 2019

## 2019-11-03 DIAGNOSIS — T827XXA Infection and inflammatory reaction due to other cardiac and vascular devices, implants and grafts, initial encounter: Secondary | ICD-10-CM | POA: Diagnosis not present

## 2019-11-03 DIAGNOSIS — R7881 Bacteremia: Secondary | ICD-10-CM | POA: Diagnosis not present

## 2019-11-03 DIAGNOSIS — I4430 Unspecified atrioventricular block: Secondary | ICD-10-CM | POA: Diagnosis not present

## 2019-11-03 DIAGNOSIS — I5022 Chronic systolic (congestive) heart failure: Secondary | ICD-10-CM | POA: Diagnosis not present

## 2019-11-03 DIAGNOSIS — B952 Enterococcus as the cause of diseases classified elsewhere: Secondary | ICD-10-CM | POA: Diagnosis not present

## 2019-11-05 DIAGNOSIS — N183 Chronic kidney disease, stage 3 unspecified: Secondary | ICD-10-CM | POA: Diagnosis not present

## 2019-11-05 DIAGNOSIS — I5022 Chronic systolic (congestive) heart failure: Secondary | ICD-10-CM | POA: Diagnosis not present

## 2019-11-05 DIAGNOSIS — I130 Hypertensive heart and chronic kidney disease with heart failure and stage 1 through stage 4 chronic kidney disease, or unspecified chronic kidney disease: Secondary | ICD-10-CM | POA: Diagnosis not present

## 2019-11-05 DIAGNOSIS — I441 Atrioventricular block, second degree: Secondary | ICD-10-CM | POA: Diagnosis not present

## 2019-11-05 DIAGNOSIS — I255 Ischemic cardiomyopathy: Secondary | ICD-10-CM | POA: Diagnosis not present

## 2019-11-05 DIAGNOSIS — I4821 Permanent atrial fibrillation: Secondary | ICD-10-CM | POA: Diagnosis not present

## 2019-11-06 DIAGNOSIS — J90 Pleural effusion, not elsewhere classified: Secondary | ICD-10-CM | POA: Diagnosis not present

## 2019-11-06 DIAGNOSIS — Z4502 Encounter for adjustment and management of automatic implantable cardiac defibrillator: Secondary | ICD-10-CM | POA: Diagnosis not present

## 2019-11-06 DIAGNOSIS — R7881 Bacteremia: Secondary | ICD-10-CM | POA: Diagnosis not present

## 2019-11-06 DIAGNOSIS — J9811 Atelectasis: Secondary | ICD-10-CM | POA: Diagnosis not present

## 2019-11-06 DIAGNOSIS — Z95810 Presence of automatic (implantable) cardiac defibrillator: Secondary | ICD-10-CM | POA: Diagnosis not present

## 2019-11-06 MED ORDER — HM VITAMIN C 1000 MG PO TABS
25.00 | ORAL_TABLET | ORAL | Status: DC
Start: 2019-11-07 — End: 2019-11-06

## 2019-11-06 MED ORDER — Medication
20.00 | Status: DC
Start: 2019-11-07 — End: 2019-11-06

## 2019-11-06 MED ORDER — Medication
1.00 | Status: DC
Start: ? — End: 2019-11-06

## 2019-11-06 MED ORDER — TUSSI PRES-B 2-15-200 MG/5ML PO LIQD
0.50 | ORAL | Status: DC
Start: ? — End: 2019-11-06

## 2019-11-07 ENCOUNTER — Telehealth: Payer: Self-pay

## 2019-11-07 ENCOUNTER — Encounter: Payer: Self-pay | Admitting: *Deleted

## 2019-11-07 ENCOUNTER — Other Ambulatory Visit: Payer: Self-pay | Admitting: *Deleted

## 2019-11-07 NOTE — Telephone Encounter (Signed)
Transition Care Management Follow-up Telephone Call   Date discharged? 11/06/19 from [place].   How have you been since you were released from the hospital? Patient states,"I am doing great. I feel better than I ever have." Appetite is good, using spirometer and wearing Cpap at bedtime. Incision site is covered and dated for stitch removal by [facility]. Denies shortness of breath, chest pain and all other symptoms.    Do you understand why you were in the hospital? Yes.   Do you understand the discharge instructions? Yes, increase activity as tolerated. Spirometer 10 times daily. Not lifting more than 10lbs. Do not push up/stand using R arm due.    Where were you discharged to? Home.   Items Reviewed:  Medications reviewed: Yes, daughter managing medications, no questions or concerns.   Allergies reviewed: Yes, none new  Dietary changes reviewed: Yes, heart healthy diet  Referrals reviewed: Yes, Cardiology, HHPT   Functional Questionnaire:   Activities of Daily Living (ADLs):   He states they are independent in the following: Bathing, dressing, ambulates with walker, self feeding, toileting.  States they require assistance with the following: Meal prep, daughter assist.    Any transportation issues/concerns?: None.   Any patient concerns? None at this time.    Confirmed importance and date/time of follow-up visits scheduled Yes, scheduled 10/13/19 @ 11:00 via doxy [phone].   Provider Appointment booked with Dr. Derrel Nip, pcp.  Confirmed with patient if condition begins to worsen call PCP or go to the ER.  Patient was given the office number and encouraged to call back with question or concerns.  : Yes.

## 2019-11-07 NOTE — Patient Outreach (Signed)
[place] [facility]) Care Management  11/07/2019  Rick Gilmore 10/21/1941 [id number]    Pt admitted to [place] with hospital stay over 10 days. Case closure per protocol.  Raina Mina, RN Care Management Coordinator [place] Office [phone]

## 2019-11-08 DIAGNOSIS — I130 Hypertensive heart and chronic kidney disease with heart failure and stage 1 through stage 4 chronic kidney disease, or unspecified chronic kidney disease: Secondary | ICD-10-CM | POA: Diagnosis not present

## 2019-11-08 DIAGNOSIS — I442 Atrioventricular block, complete: Secondary | ICD-10-CM | POA: Diagnosis not present

## 2019-11-08 DIAGNOSIS — J90 Pleural effusion, not elsewhere classified: Secondary | ICD-10-CM | POA: Diagnosis not present

## 2019-11-08 DIAGNOSIS — Z20828 Contact with and (suspected) exposure to other viral communicable diseases: Secondary | ICD-10-CM | POA: Diagnosis not present

## 2019-11-08 DIAGNOSIS — N179 Acute kidney failure, unspecified: Secondary | ICD-10-CM | POA: Diagnosis not present

## 2019-11-08 DIAGNOSIS — J810 Acute pulmonary edema: Secondary | ICD-10-CM | POA: Diagnosis not present

## 2019-11-08 DIAGNOSIS — R918 Other nonspecific abnormal finding of lung field: Secondary | ICD-10-CM | POA: Diagnosis not present

## 2019-11-08 DIAGNOSIS — Z95810 Presence of automatic (implantable) cardiac defibrillator: Secondary | ICD-10-CM | POA: Diagnosis not present

## 2019-11-08 DIAGNOSIS — I4821 Permanent atrial fibrillation: Secondary | ICD-10-CM | POA: Diagnosis not present

## 2019-11-08 DIAGNOSIS — I5022 Chronic systolic (congestive) heart failure: Secondary | ICD-10-CM | POA: Diagnosis not present

## 2019-11-08 DIAGNOSIS — Z4502 Encounter for adjustment and management of automatic implantable cardiac defibrillator: Secondary | ICD-10-CM | POA: Diagnosis not present

## 2019-11-08 DIAGNOSIS — Z9889 Other specified postprocedural states: Secondary | ICD-10-CM | POA: Diagnosis not present

## 2019-11-09 DIAGNOSIS — T827XXA Infection and inflammatory reaction due to other cardiac and vascular devices, implants and grafts, initial encounter: Secondary | ICD-10-CM | POA: Diagnosis present

## 2019-11-09 DIAGNOSIS — Z95810 Presence of automatic (implantable) cardiac defibrillator: Secondary | ICD-10-CM | POA: Diagnosis not present

## 2019-11-09 DIAGNOSIS — I2510 Atherosclerotic heart disease of native coronary artery without angina pectoris: Secondary | ICD-10-CM | POA: Diagnosis present

## 2019-11-09 DIAGNOSIS — Z955 Presence of coronary angioplasty implant and graft: Secondary | ICD-10-CM | POA: Diagnosis not present

## 2019-11-09 DIAGNOSIS — G4733 Obstructive sleep apnea (adult) (pediatric): Secondary | ICD-10-CM | POA: Diagnosis present

## 2019-11-09 DIAGNOSIS — R7881 Bacteremia: Secondary | ICD-10-CM | POA: Diagnosis present

## 2019-11-09 DIAGNOSIS — Z8546 Personal history of malignant neoplasm of prostate: Secondary | ICD-10-CM | POA: Diagnosis not present

## 2019-11-09 DIAGNOSIS — J449 Chronic obstructive pulmonary disease, unspecified: Secondary | ICD-10-CM | POA: Diagnosis present

## 2019-11-09 DIAGNOSIS — Z79899 Other long term (current) drug therapy: Secondary | ICD-10-CM | POA: Diagnosis not present

## 2019-11-09 DIAGNOSIS — Z20828 Contact with and (suspected) exposure to other viral communicable diseases: Secondary | ICD-10-CM | POA: Diagnosis present

## 2019-11-09 DIAGNOSIS — I4821 Permanent atrial fibrillation: Secondary | ICD-10-CM | POA: Diagnosis present

## 2019-11-09 DIAGNOSIS — E785 Hyperlipidemia, unspecified: Secondary | ICD-10-CM | POA: Diagnosis present

## 2019-11-09 DIAGNOSIS — K7460 Unspecified cirrhosis of liver: Secondary | ICD-10-CM | POA: Diagnosis present

## 2019-11-09 DIAGNOSIS — I130 Hypertensive heart and chronic kidney disease with heart failure and stage 1 through stage 4 chronic kidney disease, or unspecified chronic kidney disease: Secondary | ICD-10-CM | POA: Diagnosis present

## 2019-11-09 DIAGNOSIS — I081 Rheumatic disorders of both mitral and tricuspid valves: Secondary | ICD-10-CM | POA: Diagnosis present

## 2019-11-09 DIAGNOSIS — Z4502 Encounter for adjustment and management of automatic implantable cardiac defibrillator: Secondary | ICD-10-CM | POA: Diagnosis not present

## 2019-11-09 DIAGNOSIS — Z7901 Long term (current) use of anticoagulants: Secondary | ICD-10-CM | POA: Diagnosis not present

## 2019-11-09 DIAGNOSIS — I340 Nonrheumatic mitral (valve) insufficiency: Secondary | ICD-10-CM | POA: Diagnosis not present

## 2019-11-09 DIAGNOSIS — N183 Chronic kidney disease, stage 3 unspecified: Secondary | ICD-10-CM | POA: Diagnosis present

## 2019-11-09 DIAGNOSIS — I5022 Chronic systolic (congestive) heart failure: Secondary | ICD-10-CM | POA: Diagnosis present

## 2019-11-09 DIAGNOSIS — I442 Atrioventricular block, complete: Secondary | ICD-10-CM | POA: Diagnosis present

## 2019-11-09 DIAGNOSIS — E538 Deficiency of other specified B group vitamins: Secondary | ICD-10-CM | POA: Diagnosis present

## 2019-11-09 DIAGNOSIS — N179 Acute kidney failure, unspecified: Secondary | ICD-10-CM | POA: Insufficient documentation

## 2019-11-09 DIAGNOSIS — Z951 Presence of aortocoronary bypass graft: Secondary | ICD-10-CM | POA: Diagnosis not present

## 2019-11-09 DIAGNOSIS — K219 Gastro-esophageal reflux disease without esophagitis: Secondary | ICD-10-CM | POA: Diagnosis present

## 2019-11-09 DIAGNOSIS — I255 Ischemic cardiomyopathy: Secondary | ICD-10-CM | POA: Diagnosis present

## 2019-11-10 MED ORDER — ALTAFLUOR OP
1.00 | OPHTHALMIC | Status: DC
Start: 2019-11-11 — End: 2019-11-10

## 2019-11-10 MED ORDER — AMIODARONE HCL IV
200.00 | INTRAVENOUS | Status: DC
Start: 2019-11-11 — End: 2019-11-10

## 2019-11-10 MED ORDER — BAYER WOMENS 81-300 MG PO TABS
40.00 | ORAL_TABLET | ORAL | Status: DC
Start: 2019-11-11 — End: 2019-11-10

## 2019-11-10 MED ORDER — QUINERVA 260 MG PO TABS
650.00 | ORAL_TABLET | ORAL | Status: DC
Start: ? — End: 2019-11-10

## 2019-11-10 MED ORDER — WEIGHT LOSS DAILY MULTI PO TABS
5.00 | ORAL_TABLET | ORAL | Status: DC
Start: 2019-11-10 — End: 2019-11-10

## 2019-11-10 MED ORDER — Medication
1000.00 | Status: DC
Start: 2019-11-11 — End: 2019-11-10

## 2019-11-10 MED ORDER — Medication
20.00 | Status: DC
Start: 2019-11-11 — End: 2019-11-10

## 2019-11-10 MED ORDER — BILIRUBIN (URINE) TEST VI
30.00 | Status: DC
Start: 2019-11-11 — End: 2019-11-10

## 2019-11-10 MED ORDER — Medication
1.00 | Status: DC
Start: 2019-11-10 — End: 2019-11-10

## 2019-11-10 MED ORDER — X-5 THERAPEUTIC EX
2.00 | CUTANEOUS | Status: DC
Start: 2019-11-11 — End: 2019-11-10

## 2019-11-10 MED ORDER — RIBAVIRIN-INTERFERON ALFA-2B 1200/3000000 UNIT/0.5ML CO KIT
80.00 | PACK | Status: DC
Start: 2019-11-10 — End: 2019-11-10

## 2019-11-10 MED ORDER — IRBESARTAN
0.50 | Status: DC
Start: ? — End: 2019-11-10

## 2019-11-10 MED ORDER — Medication
1.00 | Status: DC
Start: ? — End: 2019-11-10

## 2019-11-10 MED ORDER — Medication
100.00 | Status: DC
Start: 2019-11-11 — End: 2019-11-10

## 2019-11-10 MED ORDER — KERLONE 10 MG PO TABS
1.00 | ORAL_TABLET | ORAL | Status: DC
Start: 2019-11-11 — End: 2019-11-10

## 2019-11-10 MED ORDER — ARMOUR THYROID 30 MG PO TABS
2.00 | ORAL_TABLET | ORAL | Status: DC
Start: ? — End: 2019-11-10

## 2019-11-10 MED ORDER — Medication
10.00 | Status: DC
Start: 2019-11-11 — End: 2019-11-10

## 2019-11-10 MED ORDER — Medication
50.00 | Status: DC
Start: 2019-11-11 — End: 2019-11-10

## 2019-11-12 ENCOUNTER — Encounter: Payer: Self-pay | Admitting: Internal Medicine

## 2019-11-12 ENCOUNTER — Ambulatory Visit (INDEPENDENT_AMBULATORY_CARE_PROVIDER_SITE_OTHER): Payer: Medicare Other | Admitting: Internal Medicine

## 2019-11-12 ENCOUNTER — Other Ambulatory Visit: Payer: Self-pay

## 2019-11-12 VITALS — Ht 74.0 in | Wt 217.0 lb

## 2019-11-12 DIAGNOSIS — Z09 Encounter for follow-up examination after completed treatment for conditions other than malignant neoplasm: Secondary | ICD-10-CM

## 2019-11-12 DIAGNOSIS — R197 Diarrhea, unspecified: Secondary | ICD-10-CM

## 2019-11-12 DIAGNOSIS — K521 Toxic gastroenteritis and colitis: Secondary | ICD-10-CM | POA: Diagnosis not present

## 2019-11-12 DIAGNOSIS — T3695XA Adverse effect of unspecified systemic antibiotic, initial encounter: Secondary | ICD-10-CM

## 2019-11-12 DIAGNOSIS — D510 Vitamin B12 deficiency anemia due to intrinsic factor deficiency: Secondary | ICD-10-CM

## 2019-11-12 DIAGNOSIS — N179 Acute kidney failure, unspecified: Secondary | ICD-10-CM

## 2019-11-12 DIAGNOSIS — I255 Ischemic cardiomyopathy: Secondary | ICD-10-CM

## 2019-11-12 DIAGNOSIS — D500 Iron deficiency anemia secondary to blood loss (chronic): Secondary | ICD-10-CM

## 2019-11-12 DIAGNOSIS — R7303 Prediabetes: Secondary | ICD-10-CM

## 2019-11-12 DIAGNOSIS — I5022 Chronic systolic (congestive) heart failure: Secondary | ICD-10-CM

## 2019-11-12 DIAGNOSIS — I5023 Acute on chronic systolic (congestive) heart failure: Secondary | ICD-10-CM | POA: Diagnosis not present

## 2019-11-12 DIAGNOSIS — N184 Chronic kidney disease, stage 4 (severe): Secondary | ICD-10-CM

## 2019-11-12 MED ORDER — QUINERVA 260 MG PO TABS
650.00 | ORAL_TABLET | ORAL | Status: DC
Start: ? — End: 2019-11-12

## 2019-11-12 MED ORDER — BILIRUBIN (URINE) TEST VI
30.00 | Status: DC
Start: 2019-11-11 — End: 2019-11-12

## 2019-11-12 MED ORDER — Medication
1000.00 | Status: DC
Start: 2019-11-11 — End: 2019-11-12

## 2019-11-12 MED ORDER — Medication
1.00 | Status: DC
Start: ? — End: 2019-11-12

## 2019-11-12 MED ORDER — Medication
50.00 | Status: DC
Start: 2019-11-11 — End: 2019-11-12

## 2019-11-12 MED ORDER — ARMOUR THYROID 30 MG PO TABS
2.00 | ORAL_TABLET | ORAL | Status: DC
Start: ? — End: 2019-11-12

## 2019-11-12 MED ORDER — IRBESARTAN
0.50 | Status: DC
Start: ? — End: 2019-11-12

## 2019-11-12 MED ORDER — Medication
1.00 | Status: DC
Start: 2019-11-10 — End: 2019-11-12

## 2019-11-12 MED ORDER — RIBAVIRIN-INTERFERON ALFA-2B 1200/3000000 UNIT/0.5ML CO KIT
80.00 | PACK | Status: DC
Start: 2019-11-10 — End: 2019-11-12

## 2019-11-12 MED ORDER — Medication
20.00 | Status: DC
Start: 2019-11-11 — End: 2019-11-12

## 2019-11-12 MED ORDER — AMIODARONE HCL 200 MG PO TABS
200.00 | ORAL_TABLET | ORAL | Status: DC
Start: 2019-11-11 — End: 2019-11-12

## 2019-11-12 MED ORDER — KERLONE 10 MG PO TABS
1.00 | ORAL_TABLET | ORAL | Status: DC
Start: 2019-11-11 — End: 2019-11-12

## 2019-11-12 MED ORDER — BAYER WOMENS 81-300 MG PO TABS
40.00 | ORAL_TABLET | ORAL | Status: DC
Start: 2019-11-11 — End: 2019-11-12

## 2019-11-12 MED ORDER — Medication
10.00 | Status: DC
Start: 2019-11-11 — End: 2019-11-12

## 2019-11-12 MED ORDER — X-5 THERAPEUTIC EX
2.00 | CUTANEOUS | Status: DC
Start: 2019-11-11 — End: 2019-11-12

## 2019-11-12 NOTE — Progress Notes (Signed)
Virtual Visit via [email or web address]  This visit type was conducted due to national recommendations for restrictions regarding the COVID-19 pandemic (e.g. social distancing).  This format is felt to be most appropriate for this patient at this time.  All issues noted in this document were discussed and addressed.  No physical exam was performed (except for noted visual exam findings with Video Visits).   I connected with@ on 11/12/19 at 11:00 AM EST by a video enabled telemedicine application  and verified that I am speaking with the correct person using two identifiers. Location patient: home Location provider: work or home office Persons participating in the virtual visit: patient, provider  I discussed the limitations, risks, security and privacy concerns of performing an evaluation and management service by telephone and the availability of in person appointments. I also discussed with the patient that there may be a patient responsible charge related to this service. The patient expressed understanding and agreed to proceed.  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    Developed infection of ICD pocket  Following ICD battery replacement on 10/27.    Presented with hematoma and bruising to local cardiologist on Nov 12. Per patient wound was not examined.  Went to ED at [facility]  on Nov 21 with increased pain at ICD site and found to have abscess under intact skin .  underwaent drainage,  iv abx and transferred to [facility] .  Cultures positive for enterobacter bacteremia    On Nov 24 at [facility] and medastinotomy with exploration, drainage and lead extraction was performed. He was discharged home on Dec 1 with additional one week of Septra DS .   Readmitted to [facility] on Dec 3 with  Symptoms concerning for  ICD firing x 4 , described by patient as 4 electric shocks that started in groin and ran down left leg . ICD was interrogated but had not fired.  Found to be in acute renal failure, attributed to Septra ,   Abx changed to cipro  Worsening Anemia not addressed during hospitalization .  hgb of 8,  Prior hgb at [facility] was 10.1 on Nov 21 .  History of iron deficiency treated with transfusions  Last EGD/colon August 2020 for heme positive stool: portal hypertensive gastropathy  Noted ,  Several polyps biopsied : tubular adenomas ,  No high grade dysplasia or malignancy    Currently states that he feels much better but still lhas very ow energy and no appetite,  still losing weight. Denies nausea and abd pain. Has been having watery loose stools for the last several days.    ROS: See pertinent positives and negatives per HPI.  Past Medical History:  Diagnosis Date  . AICD (automatic cardioverter/defibrillator) present   . Anemia   . Arrhythmia    a fib  . Atrial fibrillation ([place])    on Coumadin  . CAD (coronary artery disease)   . Cancer [facility]) 2001   Prostate, XRT and implant  . CHF (congestive  heart failure) ([place])   . COPD (chronic obstructive pulmonary disease) ([place])   . Erectile dysfunction   . GERD (gastroesophageal reflux disease)   . Hyperlipidemia   . Hypertension   . Ischemic cardiomyopathy    s/p AICD/pacer 2009, replaced 2010 [facility]  . MI (myocardial infarction) ([place]) 1988  . Personal history of prostate cancer 2001   s/p XRT and implant ([place])  . Prostate cancer ([place])   . Sleep apnea, obstructive    uses CPAP nightly  . Thrombocytopenia ([place])   . Tubular adenoma    colon polyp  . Vitamin B 12 deficiency     Past Surgical History:  Procedure  Laterality Date  . CARDIAC CATHETERIZATION    . COLONOSCOPY WITH PROPOFOL [name] 05/30/2015   Procedure: COLONOSCOPY WITH PROPOFOL;  Surgeon: Manya Silvas, MD;  Location: [facility] ENDOSCOPY;  Service: Endoscopy;  Laterality: [name];  . COLONOSCOPY WITH PROPOFOL [name] 07/16/2019   Procedure: COLONOSCOPY WITH PROPOFOL;  Surgeon: Toledo, Benay Pike, MD;  Location: [facility] ENDOSCOPY;  Service: Gastroenterology;  Laterality: [name];  . CORONARY ARTERY BYPASS GRAFT  1988  . ESOPHAGOGASTRODUODENOSCOPY [name] 05/30/2015   Procedure: ESOPHAGOGASTRODUODENOSCOPY (EGD);  Surgeon: Manya Silvas, MD;  Location: [facility] ENDOSCOPY;  Service: Endoscopy;  Laterality: [name];  . ESOPHAGOGASTRODUODENOSCOPY (EGD) WITH PROPOFOL [name] 07/16/2019   Procedure: ESOPHAGOGASTRODUODENOSCOPY (EGD) WITH PROPOFOL;  Surgeon: Toledo, Benay Pike, MD;  Location: [facility] ENDOSCOPY;  Service: Gastroenterology;  Laterality: [name];  . IMPLANTABLE CARDIOVERTER DEFIBRILLATOR (ICD) GENERATOR CHANGE Left 10/02/2019   Procedure: ICD GENERATOR CHANGE;  Surgeon: Isaias Cowman, MD;  Location: [facility] ORS;  Service: Cardiovascular;  Laterality: Left;  . IMPLANTABLE CARDIOVERTER DEFIBRILLATOR GENERATOR CHANGE  April 2014   Bi V RRR  . INSERT / REPLACE / REMOVE PACEMAKER  2014  . VASECTOMY      Family History  Problem Relation Age of Onset  . Hypertension Mother   . Hypertension Father   . Cancer Father        prostate, throat  . Emphysema Father   . Heart disease Maternal Grandmother   . Heart disease Paternal Grandmother     SOCIAL HX:  reports that he has never smoked. He has never used smokeless tobacco. He reports current alcohol use of about 4.0 standard drinks of alcohol per week. He reports that he does not use drugs.   Current Outpatient Medications:  .  albuterol (PROVENTIL HFA;VENTOLIN HFA) 108 (90 Base) MCG/ACT inhaler, Inhale 2 puffs into the lungs every 6 (six) hours as needed for wheezing or shortness of breath., Disp: 1 Inhaler, Rfl: 1 .   allopurinol (ZYLOPRIM) 100 MG tablet, Take 1 tablet (100 mg total) by mouth daily., Disp: 90 tablet, Rfl: 3 .  amiodarone (PACERONE) 200 MG tablet, Take 1 tablet (200 mg total) by mouth daily., Disp: 30 tablet, Rfl: 0 .  ANORO ELLIPTA 62.5-25 MCG/INH AEPB, USE 1 INHALATION DAILY (Patient taking differently: Inhale 1 puff into the lungs daily. ), Disp: 60 each, Rfl: 0 .  apixaban (ELIQUIS) 5 MG TABS tablet, Take 5 mg by mouth 2 (two) times daily. , Disp: , Rfl:  .  ciprofloxacin (CIPRO) 500 MG tablet, Take by mouth., Disp: , Rfl:  .  colchicine 0.6 MG tablet, Take 0.6 mg by mouth as needed. , Disp: , Rfl:  .  Cyanocobalamin 1000 MCG SUBL, Place 1 tablet (1,000 mcg total) under the tongue every morning., Disp: 90 tablet, Rfl: 3 .  escitalopram (LEXAPRO) 10 MG tablet, Take 1 tablet (10 mg total) by  mouth daily., Disp: 90 tablet, Rfl: 3 .  isosorbide mononitrate (IMDUR) 30 MG 24 hr tablet, Take 30 mg by mouth daily., Disp: , Rfl: 11 .  magnesium oxide (MAG-OX) 400 MG tablet, Take 400 mg by mouth 2 (two) times daily., Disp: , Rfl:  .  metoprolol succinate (TOPROL-XL) 50 MG 24 hr tablet, Take 1 tablet (50 mg total) by mouth daily. Take with or immediately following a meal., Disp: 30 tablet, Rfl: 0 .  montelukast (SINGULAIR) 10 MG tablet, Take 1 tablet (10 mg total) by mouth at bedtime., Disp: 90 tablet, Rfl: 3 .  pantoprazole (PROTONIX) 40 MG tablet, Take 1 tablet (40 mg total) by mouth daily., Disp: 90 tablet, Rfl: 3 .  potassium chloride SA (K-DUR) 20 MEQ tablet, Take 1 tablet (20 mEq total) by mouth daily., Disp: 90 tablet, Rfl: 3 .  pravastatin (PRAVACHOL) 80 MG tablet, Take 80 mg by mouth daily., Disp: , Rfl:  .  spironolactone (ALDACTONE) 25 MG tablet, Take 25 mg by mouth daily., Disp: , Rfl:  .  torsemide (DEMADEX) 20 MG tablet, Take 1 tablet (20 mg total) by mouth daily., Disp: 30 tablet, Rfl: 0  EXAM:  VITALS per patient if applicable:  GENERAL: alert, oriented, appears well and in no acute  distress  HEENT: atraumatic, conjunttiva clear, no obvious abnormalities on inspection of external nose and ears  NECK: normal movements of the head and neck  LUNGS: on inspection no signs of respiratory distress, breathing rate appears normal, no obvious gross SOB, gasping or wheezing  CV: no obvious cyanosis  MS: moves all visible extremities without noticeable abnormality  Chest well: incision sites with intact steri strips bilaterally,  No erythema or oozing noted.   PSYCH/NEURO: pleasant and cooperative, no obvious depression or anxiety, speech and thought processing grossly intact  ASSESSMENT AND PLAN:  Discussed the following assessment and plan:  Antibiotic-associated diarrhea - Plan: Clostridium Difficile by PCR([facility]/Sunquest), Stool Culture, Stool, WBC/Lactoferrin  Acute on chronic systolic CHF (congestive heart failure) (HCC)  Chronic systolic heart failure ([name]) - Plan: Ambulatory referral to Cardiology  Ischemic cardiomyopathy - Plan: Ambulatory referral to Cardiology  Iron deficiency anemia due to chronic blood loss - Plan: CBC with Differential/Platelet, Iron, TIBC and Ferritin Panel, Erythropoietin, Retic  Acute renal failure superimposed on stage 4 chronic kidney disease, unspecified acute renal failure type ([place]) - Plan: Comprehensive metabolic panel  Pernicious anemia - Plan: Vitamin B12  Prediabetes - Plan: Hemoglobin A1c  Hospital discharge follow-up  Diarrhea of presumed infectious origin  Iron deficiency anemia Multifactorial  ..  Cirrhosis,  2./2 NASH with  EGD/colonoscopy August 2020 noting poratal  hypertensive gastropathy, tubular adenomas,  Acute on chronic renal disease aggravated by use of Septra for management of  ICD pocket infection  With Enterobacter.  EPO, retic ct  CBC iron levels ordered.  Will need infusion of iron and possibly blood  given symptomatic anemia and low EF   Prediabetes Repeat a1c ordered   Chronic systolic heart  failure [facility]) Patient requesting transfer of cardiology care from [facility] to [facility] following recent ICD pocket infection . Referral in process.  Continue daily weights and fluid restriction   Hospital discharge follow-up Patient recently admitted for ICD pocket infection wit bacteremia , followed by readmission for possible ICD firing which was not proven.  Patient is stable post discharge but has a worsening anemia whih was not addressed during admission  . All labs , imaging studies and progress notes from admission were reviewed  with patient today  And follow up labs ,  Cardiology referral made   Diarrhea Need to rule out c dificie colitis given recent use of abx for ICD pocket infection     I discussed the assessment and treatment plan with the patient. The patient was provided an opportunity to ask questions and all were answered. The patient agreed with the plan and demonstrated an understanding of the instructions.   The patient was advised to call back or seek an in-person evaluation if the symptoms worsen or if the condition fails to improve as anticipated.  I provided  25 minutes of non-face-to-face time during this encounter reviewing patient's current problems and past procedures/imaging studies, an additional 40 minutes reviewing his most recent admissions to [facility], providing counseling on the above mentioned problems , and coordination  of care .  Crecencio Mc, MD

## 2019-11-13 NOTE — Telephone Encounter (Signed)
Can you ask Rick Gilmore to come in for the labs and stools studies that I ordered.  I don't want ot wait for home health to get around to doing it.

## 2019-11-13 NOTE — Assessment & Plan Note (Signed)
Patient requesting transfer of cardiology care from [facility] to [facility] following recent ICD pocket infection . Referral in process.  Continue daily weights and fluid restriction

## 2019-11-13 NOTE — Assessment & Plan Note (Signed)
Multifactorial  ..  Cirrhosis,  2./2 NASH with  EGD/colonoscopy August 2020 noting poratal  hypertensive gastropathy, tubular adenomas,  Acute on chronic renal disease aggravated by use of Septra for management of  ICD pocket infection  With Enterobacter.  EPO, retic ct  CBC iron levels ordered.  Will need infusion of iron and possibly blood  given symptomatic anemia and low EF

## 2019-11-13 NOTE — Telephone Encounter (Signed)
Spoke with pt's daughter and she is going to bring the pt in tomorrow for the lab work and to get the supplies for the stool studies.

## 2019-11-13 NOTE — Telephone Encounter (Signed)
LMTCB

## 2019-11-13 NOTE — Assessment & Plan Note (Signed)
Repeat a1c ordered.

## 2019-11-13 NOTE — Assessment & Plan Note (Signed)
Patient recently admitted for ICD pocket infection wit bacteremia , followed by readmission for possible ICD firing which was not proven.  Patient is stable post discharge but has a worsening anemia whih was not addressed during admission  . All labs , imaging studies and progress notes from admission were reviewed with patient today  And follow up labs ,  Cardiology referral made

## 2019-11-13 NOTE — Assessment & Plan Note (Signed)
Need to rule out c dificie colitis given recent use of abx for ICD pocket infection

## 2019-11-14 ENCOUNTER — Other Ambulatory Visit: Payer: Self-pay

## 2019-11-14 ENCOUNTER — Other Ambulatory Visit (INDEPENDENT_AMBULATORY_CARE_PROVIDER_SITE_OTHER): Payer: Medicare Other

## 2019-11-14 DIAGNOSIS — R7303 Prediabetes: Secondary | ICD-10-CM

## 2019-11-14 DIAGNOSIS — D510 Vitamin B12 deficiency anemia due to intrinsic factor deficiency: Secondary | ICD-10-CM

## 2019-11-14 DIAGNOSIS — N179 Acute kidney failure, unspecified: Secondary | ICD-10-CM

## 2019-11-14 DIAGNOSIS — T3695XA Adverse effect of unspecified systemic antibiotic, initial encounter: Secondary | ICD-10-CM

## 2019-11-14 DIAGNOSIS — N184 Chronic kidney disease, stage 4 (severe): Secondary | ICD-10-CM

## 2019-11-14 DIAGNOSIS — K521 Toxic gastroenteritis and colitis: Secondary | ICD-10-CM

## 2019-11-14 DIAGNOSIS — D500 Iron deficiency anemia secondary to blood loss (chronic): Secondary | ICD-10-CM

## 2019-11-14 NOTE — Addendum Note (Signed)
Addended by: Elpidio Galea T on: 11/14/2019 03:18 PM   Modules accepted: Orders

## 2019-11-15 ENCOUNTER — Ambulatory Visit: Payer: Medicare Other | Admitting: Cardiology

## 2019-11-15 ENCOUNTER — Encounter: Payer: Self-pay | Admitting: Podiatry

## 2019-11-15 ENCOUNTER — Other Ambulatory Visit: Payer: Medicare Other

## 2019-11-15 ENCOUNTER — Ambulatory Visit (INDEPENDENT_AMBULATORY_CARE_PROVIDER_SITE_OTHER): Payer: Medicare Other | Admitting: Podiatry

## 2019-11-15 ENCOUNTER — Telehealth: Payer: Self-pay

## 2019-11-15 DIAGNOSIS — D689 Coagulation defect, unspecified: Secondary | ICD-10-CM

## 2019-11-15 DIAGNOSIS — I5022 Chronic systolic (congestive) heart failure: Secondary | ICD-10-CM | POA: Diagnosis not present

## 2019-11-15 DIAGNOSIS — I255 Ischemic cardiomyopathy: Secondary | ICD-10-CM | POA: Diagnosis not present

## 2019-11-15 DIAGNOSIS — I2510 Atherosclerotic heart disease of native coronary artery without angina pectoris: Secondary | ICD-10-CM | POA: Diagnosis not present

## 2019-11-15 DIAGNOSIS — N400 Enlarged prostate without lower urinary tract symptoms: Secondary | ICD-10-CM | POA: Diagnosis not present

## 2019-11-15 DIAGNOSIS — Z8546 Personal history of malignant neoplasm of prostate: Secondary | ICD-10-CM | POA: Diagnosis not present

## 2019-11-15 DIAGNOSIS — I081 Rheumatic disorders of both mitral and tricuspid valves: Secondary | ICD-10-CM | POA: Diagnosis not present

## 2019-11-15 DIAGNOSIS — M79676 Pain in unspecified toe(s): Secondary | ICD-10-CM | POA: Diagnosis not present

## 2019-11-15 DIAGNOSIS — B351 Tinea unguium: Secondary | ICD-10-CM | POA: Diagnosis not present

## 2019-11-15 DIAGNOSIS — I130 Hypertensive heart and chronic kidney disease with heart failure and stage 1 through stage 4 chronic kidney disease, or unspecified chronic kidney disease: Secondary | ICD-10-CM | POA: Diagnosis not present

## 2019-11-15 DIAGNOSIS — K766 Portal hypertension: Secondary | ICD-10-CM | POA: Diagnosis not present

## 2019-11-15 DIAGNOSIS — B952 Enterococcus as the cause of diseases classified elsewhere: Secondary | ICD-10-CM | POA: Diagnosis not present

## 2019-11-15 DIAGNOSIS — N183 Chronic kidney disease, stage 3 unspecified: Secondary | ICD-10-CM | POA: Diagnosis not present

## 2019-11-15 DIAGNOSIS — G4730 Sleep apnea, unspecified: Secondary | ICD-10-CM | POA: Diagnosis not present

## 2019-11-15 DIAGNOSIS — Z95810 Presence of automatic (implantable) cardiac defibrillator: Secondary | ICD-10-CM | POA: Diagnosis not present

## 2019-11-15 DIAGNOSIS — T827XXD Infection and inflammatory reaction due to other cardiac and vascular devices, implants and grafts, subsequent encounter: Secondary | ICD-10-CM | POA: Diagnosis not present

## 2019-11-15 DIAGNOSIS — I4891 Unspecified atrial fibrillation: Secondary | ICD-10-CM | POA: Diagnosis not present

## 2019-11-15 DIAGNOSIS — R7881 Bacteremia: Secondary | ICD-10-CM | POA: Diagnosis not present

## 2019-11-15 DIAGNOSIS — N179 Acute kidney failure, unspecified: Secondary | ICD-10-CM | POA: Diagnosis not present

## 2019-11-15 DIAGNOSIS — Z951 Presence of aortocoronary bypass graft: Secondary | ICD-10-CM | POA: Diagnosis not present

## 2019-11-15 DIAGNOSIS — K7460 Unspecified cirrhosis of liver: Secondary | ICD-10-CM | POA: Diagnosis not present

## 2019-11-15 DIAGNOSIS — I442 Atrioventricular block, complete: Secondary | ICD-10-CM | POA: Diagnosis not present

## 2019-11-15 DIAGNOSIS — E538 Deficiency of other specified B group vitamins: Secondary | ICD-10-CM | POA: Diagnosis not present

## 2019-11-15 DIAGNOSIS — F329 Major depressive disorder, single episode, unspecified: Secondary | ICD-10-CM | POA: Diagnosis not present

## 2019-11-15 DIAGNOSIS — Z955 Presence of coronary angioplasty implant and graft: Secondary | ICD-10-CM | POA: Diagnosis not present

## 2019-11-15 DIAGNOSIS — J449 Chronic obstructive pulmonary disease, unspecified: Secondary | ICD-10-CM | POA: Diagnosis not present

## 2019-11-15 DIAGNOSIS — I0981 Rheumatic heart failure: Secondary | ICD-10-CM | POA: Diagnosis not present

## 2019-11-15 DIAGNOSIS — E782 Mixed hyperlipidemia: Secondary | ICD-10-CM | POA: Diagnosis not present

## 2019-11-15 LAB — HEMOGLOBIN A1C: Hgb A1c MFr Bld: 5.2 % (ref 4.6–6.5)

## 2019-11-15 LAB — CBC WITH DIFFERENTIAL/PLATELET
Basophils Absolute: 0.1 10*3/uL (ref 0.0–0.1)
Basophils Relative: 1.3 % (ref 0.0–3.0)
Eosinophils Absolute: 0 10*3/uL (ref 0.0–0.7)
Eosinophils Relative: 0.7 % (ref 0.0–5.0)
HCT: 27.5 % — ABNORMAL LOW (ref 39.0–52.0)
Hemoglobin: 9 g/dL — ABNORMAL LOW (ref 13.0–17.0)
Lymphocytes Relative: 17.7 % (ref 12.0–46.0)
Lymphs Abs: 1.1 10*3/uL (ref 0.7–4.0)
MCHC: 32.6 g/dL (ref 30.0–36.0)
MCV: 94.6 fl (ref 78.0–100.0)
Monocytes Absolute: 0.5 10*3/uL (ref 0.1–1.0)
Monocytes Relative: 7.9 % (ref 3.0–12.0)
Neutro Abs: 4.6 10*3/uL (ref 1.4–7.7)
Neutrophils Relative %: 72.4 % (ref 43.0–77.0)
Platelets: 122 10*3/uL — ABNORMAL LOW (ref 150.0–400.0)
RBC: 2.91 Mil/uL — ABNORMAL LOW (ref 4.22–5.81)
RDW: 20.4 % — ABNORMAL HIGH (ref 11.5–15.5)
WBC: 6.4 10*3/uL (ref 4.0–10.5)

## 2019-11-15 LAB — COMPREHENSIVE METABOLIC PANEL
ALT: 8 U/L (ref 0–53)
AST: 15 U/L (ref 0–37)
Albumin: 3.9 g/dL (ref 3.5–5.2)
Alkaline Phosphatase: 75 U/L (ref 39–117)
BUN: 29 mg/dL — ABNORMAL HIGH (ref 6–23)
CO2: 22 mEq/L (ref 19–32)
Calcium: 8.9 mg/dL (ref 8.4–10.5)
Chloride: 103 mEq/L (ref 96–112)
Creatinine, Ser: 2.66 mg/dL — ABNORMAL HIGH (ref 0.40–1.50)
GFR: 23.36 mL/min — ABNORMAL LOW (ref >60.00)
Glucose, Bld: 104 mg/dL — ABNORMAL HIGH (ref 70–99)
Potassium: 4 mEq/L (ref 3.5–5.1)
Sodium: 137 mEq/L (ref 135–145)
Total Bilirubin: 0.8 mg/dL (ref 0.2–1.2)
Total Protein: 6.5 g/dL (ref 6.0–8.3)

## 2019-11-15 LAB — VITAMIN B12: Vitamin B-12: 838 pg/mL (ref 211–911)

## 2019-11-15 NOTE — Addendum Note (Signed)
Addended by: Elpidio Galea T on: 11/15/2019 04:31 PM   Modules accepted: Orders

## 2019-11-15 NOTE — Addendum Note (Signed)
Addended by: Elpidio Galea T on: 11/15/2019 04:21 PM   Modules accepted: Orders

## 2019-11-15 NOTE — Telephone Encounter (Signed)
Verbal orders given  

## 2019-11-15 NOTE — Progress Notes (Signed)
This patient presents the office with chief complaint of long thick nails  He states the nails are painful walking and wearing his shoes. He is unable to self treat. He  presents the office today for an evaluation and treatment of his nails and    No pain from callus right forefoot today.  Patient is taking eliquiss.  GENERAL APPEARANCE: Alert, conversant. Appropriately groomed. No acute distress.  VASCULAR: Pedal pulses are  palpable at  DP and PT bilateral.  Capillary refill time is immediate to all digits,  Normal temperature gradient.  Digital hair growth is present bilateral  NEUROLOGIC: sensation is normal to 5.07 monofilament at 5/5 sites bilateral.  Light touch is intact bilateral, Muscle strength normal.  MUSCULOSKELETAL: acceptable muscle strength, tone and stability bilateral.  Intrinsic muscluature intact bilateral.  Rectus appearance of foot .  Severe hammer toes 1-5  B/L   DERMATOLOGIC: skin color, texture, and turgor are within normal limits.  No preulcerative lesions or ulcers  are seen, no interdigital maceration noted.  No open lesions present.  .   NAILS  thick disfigured discolored hallux toenails, both feet   Onychomycosis hallux nails    Debridement of nails. RTC  10 weeks  for preventive foot care services.       Mikalah Skyles DPM  

## 2019-11-15 NOTE — Telephone Encounter (Signed)
Copied from [place] [id number]. Topic: Quick Communication - Home Health Verbal Orders >> Nov 15, 2019  1:18 PM Carolyn Stare wrote: Cristi Loron with [facility]  Callback Number [phone]  [id number]  PT    and home health aide    2 x 3 starting next week 11/19/2019   Frequency 1 X 1   2 X 3

## 2019-11-16 DIAGNOSIS — I0981 Rheumatic heart failure: Secondary | ICD-10-CM | POA: Diagnosis not present

## 2019-11-16 DIAGNOSIS — I255 Ischemic cardiomyopathy: Secondary | ICD-10-CM | POA: Diagnosis not present

## 2019-11-16 DIAGNOSIS — N1832 Chronic kidney disease, stage 3b: Secondary | ICD-10-CM

## 2019-11-16 DIAGNOSIS — D638 Anemia in other chronic diseases classified elsewhere: Secondary | ICD-10-CM

## 2019-11-16 DIAGNOSIS — I5022 Chronic systolic (congestive) heart failure: Secondary | ICD-10-CM | POA: Diagnosis not present

## 2019-11-16 DIAGNOSIS — D500 Iron deficiency anemia secondary to blood loss (chronic): Secondary | ICD-10-CM

## 2019-11-16 DIAGNOSIS — N183 Chronic kidney disease, stage 3 unspecified: Secondary | ICD-10-CM | POA: Diagnosis not present

## 2019-11-16 DIAGNOSIS — I130 Hypertensive heart and chronic kidney disease with heart failure and stage 1 through stage 4 chronic kidney disease, or unspecified chronic kidney disease: Secondary | ICD-10-CM | POA: Diagnosis not present

## 2019-11-16 DIAGNOSIS — I2510 Atherosclerotic heart disease of native coronary artery without angina pectoris: Secondary | ICD-10-CM | POA: Diagnosis not present

## 2019-11-16 LAB — IRON,TIBC AND FERRITIN PANEL
%SAT: 21 % (calc) (ref 20–48)
Ferritin: 311 ng/mL (ref 24–380)
Iron: 62 ug/dL (ref 50–180)
TIBC: 299 mcg/dL (calc) (ref 250–425)

## 2019-11-16 LAB — RETICULOCYTES
ABS Retic: 127600 cells/uL — ABNORMAL HIGH (ref 25000–9000)
Retic Ct Pct: 4.4 %

## 2019-11-16 LAB — ERYTHROPOIETIN: Erythropoietin: 39 m[IU]/mL — ABNORMAL HIGH (ref 2.6–18.5)

## 2019-11-19 DIAGNOSIS — N183 Chronic kidney disease, stage 3 unspecified: Secondary | ICD-10-CM | POA: Diagnosis not present

## 2019-11-19 DIAGNOSIS — I2510 Atherosclerotic heart disease of native coronary artery without angina pectoris: Secondary | ICD-10-CM | POA: Diagnosis not present

## 2019-11-19 DIAGNOSIS — I255 Ischemic cardiomyopathy: Secondary | ICD-10-CM | POA: Diagnosis not present

## 2019-11-19 DIAGNOSIS — I130 Hypertensive heart and chronic kidney disease with heart failure and stage 1 through stage 4 chronic kidney disease, or unspecified chronic kidney disease: Secondary | ICD-10-CM | POA: Diagnosis not present

## 2019-11-19 DIAGNOSIS — I5022 Chronic systolic (congestive) heart failure: Secondary | ICD-10-CM | POA: Diagnosis not present

## 2019-11-19 DIAGNOSIS — I0981 Rheumatic heart failure: Secondary | ICD-10-CM | POA: Diagnosis not present

## 2019-11-20 ENCOUNTER — Ambulatory Visit: Payer: Medicare Other | Admitting: *Deleted

## 2019-11-20 DIAGNOSIS — I5022 Chronic systolic (congestive) heart failure: Secondary | ICD-10-CM | POA: Diagnosis not present

## 2019-11-20 DIAGNOSIS — I255 Ischemic cardiomyopathy: Secondary | ICD-10-CM | POA: Diagnosis not present

## 2019-11-20 DIAGNOSIS — I0981 Rheumatic heart failure: Secondary | ICD-10-CM | POA: Diagnosis not present

## 2019-11-20 DIAGNOSIS — I130 Hypertensive heart and chronic kidney disease with heart failure and stage 1 through stage 4 chronic kidney disease, or unspecified chronic kidney disease: Secondary | ICD-10-CM | POA: Diagnosis not present

## 2019-11-20 DIAGNOSIS — I2510 Atherosclerotic heart disease of native coronary artery without angina pectoris: Secondary | ICD-10-CM | POA: Diagnosis not present

## 2019-11-20 DIAGNOSIS — N183 Chronic kidney disease, stage 3 unspecified: Secondary | ICD-10-CM | POA: Diagnosis not present

## 2019-11-21 DIAGNOSIS — I2510 Atherosclerotic heart disease of native coronary artery without angina pectoris: Secondary | ICD-10-CM | POA: Diagnosis not present

## 2019-11-21 DIAGNOSIS — I255 Ischemic cardiomyopathy: Secondary | ICD-10-CM | POA: Diagnosis not present

## 2019-11-21 DIAGNOSIS — I5022 Chronic systolic (congestive) heart failure: Secondary | ICD-10-CM | POA: Diagnosis not present

## 2019-11-21 DIAGNOSIS — N183 Chronic kidney disease, stage 3 unspecified: Secondary | ICD-10-CM | POA: Diagnosis not present

## 2019-11-21 DIAGNOSIS — I130 Hypertensive heart and chronic kidney disease with heart failure and stage 1 through stage 4 chronic kidney disease, or unspecified chronic kidney disease: Secondary | ICD-10-CM | POA: Diagnosis not present

## 2019-11-21 DIAGNOSIS — I0981 Rheumatic heart failure: Secondary | ICD-10-CM | POA: Diagnosis not present

## 2019-11-23 ENCOUNTER — Other Ambulatory Visit: Payer: Self-pay | Admitting: Oncology

## 2019-11-23 ENCOUNTER — Inpatient Hospital Stay: Payer: Medicare Other | Admitting: Oncology

## 2019-11-23 ENCOUNTER — Inpatient Hospital Stay: Payer: Medicare Other | Attending: Oncology

## 2019-11-23 ENCOUNTER — Other Ambulatory Visit: Payer: Self-pay

## 2019-11-23 DIAGNOSIS — I130 Hypertensive heart and chronic kidney disease with heart failure and stage 1 through stage 4 chronic kidney disease, or unspecified chronic kidney disease: Secondary | ICD-10-CM | POA: Diagnosis not present

## 2019-11-23 DIAGNOSIS — I5022 Chronic systolic (congestive) heart failure: Secondary | ICD-10-CM | POA: Diagnosis not present

## 2019-11-23 DIAGNOSIS — Z808 Family history of malignant neoplasm of other organs or systems: Secondary | ICD-10-CM | POA: Diagnosis not present

## 2019-11-23 DIAGNOSIS — N183 Chronic kidney disease, stage 3 unspecified: Secondary | ICD-10-CM | POA: Diagnosis not present

## 2019-11-23 DIAGNOSIS — I2510 Atherosclerotic heart disease of native coronary artery without angina pectoris: Secondary | ICD-10-CM | POA: Diagnosis not present

## 2019-11-23 DIAGNOSIS — Z8042 Family history of malignant neoplasm of prostate: Secondary | ICD-10-CM | POA: Diagnosis not present

## 2019-11-23 DIAGNOSIS — I255 Ischemic cardiomyopathy: Secondary | ICD-10-CM | POA: Diagnosis not present

## 2019-11-23 DIAGNOSIS — D631 Anemia in chronic kidney disease: Secondary | ICD-10-CM | POA: Insufficient documentation

## 2019-11-23 DIAGNOSIS — D649 Anemia, unspecified: Secondary | ICD-10-CM

## 2019-11-23 DIAGNOSIS — E538 Deficiency of other specified B group vitamins: Secondary | ICD-10-CM | POA: Diagnosis not present

## 2019-11-23 DIAGNOSIS — I0981 Rheumatic heart failure: Secondary | ICD-10-CM | POA: Diagnosis not present

## 2019-11-23 DIAGNOSIS — N189 Chronic kidney disease, unspecified: Secondary | ICD-10-CM | POA: Insufficient documentation

## 2019-11-23 LAB — RETICULOCYTES
Immature Retic Fract: 21.7 % — ABNORMAL HIGH (ref 2.3–15.9)
RBC.: 2.95 MIL/uL — ABNORMAL LOW (ref 4.22–5.81)
Retic Count, Absolute: 100.3 10*3/uL (ref 19.0–186.0)
Retic Ct Pct: 3.4 % — ABNORMAL HIGH (ref 0.4–3.1)

## 2019-11-23 LAB — CBC
HCT: 29.2 % — ABNORMAL LOW (ref 39.0–52.0)
Hemoglobin: 8.8 g/dL — ABNORMAL LOW (ref 13.0–17.0)
MCH: 29.8 pg (ref 26.0–34.0)
MCHC: 30.1 g/dL (ref 30.0–36.0)
MCV: 99 fL (ref 80.0–100.0)
Platelets: 82 10*3/uL — ABNORMAL LOW (ref 150–400)
RBC: 2.95 MIL/uL — ABNORMAL LOW (ref 4.22–5.81)
RDW: 18.7 % — ABNORMAL HIGH (ref 11.5–15.5)
WBC: 6.1 10*3/uL (ref 4.0–10.5)
nRBC: 0 % (ref 0.0–0.2)

## 2019-11-23 LAB — LACTATE DEHYDROGENASE: LDH: 168 U/L (ref 98–192)

## 2019-11-23 LAB — FOLATE: Folate: 4.5 ng/mL — ABNORMAL LOW (ref >5.9)

## 2019-11-23 LAB — DAT, POLYSPECIFIC AHG (ARMC ONLY): Polyspecific AHG test: NEGATIVE

## 2019-11-23 LAB — VITAMIN B12: Vitamin B-12: 959 pg/mL — ABNORMAL HIGH (ref 180–914)

## 2019-11-24 LAB — HAPTOGLOBIN: Haptoglobin: 83 mg/dL (ref 34–355)

## 2019-11-25 NOTE — Progress Notes (Signed)
Cardiology Office Note  Date:  11/26/2019   ID:  Rick Gilmore, DOB 1941-03-22, MRN [id number]  PCP:  Crecencio Mc, MD   Chief Complaint  Patient presents with  . New Patient (Initial Visit)    Ref by Dr. Derrel Nip for chronic systolic heart failure/ischemic cardiomyopathy. Former patient of Dr. Serafina Royals. Meds reviewed by the pt.'s daughter. Pt. c/o LE edema and shortness of breath with little activity.     HPI:  Rick Gilmore is a 78 year old gentleman with past medical history of Iron def anemia CRI Chronic systolic CHF, ejection fraction 25 to 30% CAD, prior MI VT Portal HTN  Pacer/ICD Atrial fibrillation OSA ICD infection Who presents to establish care in the [facility] office, follow-up with his CHF  Previously seen by [facility] cardiology In the hospital 09/2019, acute on chronic systolic CHF  Echocardiogram showing continued moderate LV systolic dysfunction with ejection fraction of 25-30% and moderate to severe mitral regurgitation Large anterior, lateral and posterolateral myocardial infarction. Left atrial size was severely dilated. Moderately elevated pulmonary artery systolic pressure.  ICD battery change out 10/02/2019 He reports he develops infection in the pocket This required lead extraction for bacteremia October 2020  Prior to hospitalization was taking torsemide 60 in the morning 40 in the evening After hospital discharge was put on torsemide 20 daily Since that time he has had worsening abdominal swelling, leg swelling Family who presents with him today reports he typically never has leg swelling Swelling is severe per the daughter  Denies excessive fluid intake  in summer 2020, weight was 269,  Now weight 220, stopped eating during his hospital course Reports he was in hospital 40 days  EKG personally reviewed by myself on todays visit Shows paced rhythm rate 70 bpm  REDS VEST 33 on today's visit   PMH:   has a past medical history  of AICD (automatic cardioverter/defibrillator) present, Anemia, Arrhythmia, Atrial fibrillation ([place]), CAD (coronary artery disease), Cancer ([place]) (2001), CHF (congestive heart failure) ([place]), COPD (chronic obstructive pulmonary disease) ([place]), Erectile dysfunction, GERD (gastroesophageal reflux disease), Hyperlipidemia, Hypertension, Ischemic cardiomyopathy, MI (myocardial infarction) ([place]) (1988), Personal history of prostate cancer (2001), Prostate cancer ([place]), Sleep apnea, obstructive, Thrombocytopenia ([place]), Tubular adenoma, and Vitamin B 12 deficiency.  PSH:    Past Surgical History:  Procedure Laterality Date  . CARDIAC CATHETERIZATION    . COLONOSCOPY WITH PROPOFOL [name] 05/30/2015   Procedure: COLONOSCOPY WITH PROPOFOL;  Surgeon: Manya Silvas, MD;  Location: [facility] ENDOSCOPY;  Service: Endoscopy;  Laterality: [name];  . COLONOSCOPY WITH PROPOFOL [name] 07/16/2019   Procedure: COLONOSCOPY WITH PROPOFOL;  Surgeon: Toledo, Benay Pike, MD;  Location: [facility] ENDOSCOPY;  Service: Gastroenterology;  Laterality: [name];  . CORONARY ARTERY BYPASS GRAFT  1988  . ESOPHAGOGASTRODUODENOSCOPY [name] 05/30/2015   Procedure: ESOPHAGOGASTRODUODENOSCOPY (EGD);  Surgeon: Manya Silvas, MD;  Location[facility] ENDOSCOPY;  Service: Endoscopy;  Laterality: [name];  . ESOPHAGOGASTRODUODENOSCOPY (EGD) WITH PROPOFOL [name] 07/16/2019   Procedure: ESOPHAGOGASTRODUODENOSCOPY (EGD) WITH PROPOFOL;  Surgeon: Toledo, Benay Pike, MD;  Location: [facility] ENDOSCOPY;  Service: Gastroenterology;  Laterality: [name];  . IMPLANTABLE CARDIOVERTER DEFIBRILLATOR (ICD) GENERATOR CHANGE Left 10/02/2019   Procedure: ICD GENERATOR CHANGE;  Surgeon: Isaias Cowman, MD;  Location: [facility] ORS;  Service: Cardiovascular;  Laterality: Left;  . IMPLANTABLE CARDIOVERTER DEFIBRILLATOR GENERATOR CHANGE  April 2014   Bi V RRR  . INSERT / REPLACE / REMOVE PACEMAKER  2014  . VASECTOMY      Current Outpatient Medications  Medication Sig Dispense Refill  .  albuterol  (PROVENTIL HFA;VENTOLIN HFA) 108 (90 Base) MCG/ACT inhaler Inhale 2 puffs into the lungs every 6 (six) hours as needed for wheezing or shortness of breath. 1 Inhaler 1  . allopurinol (ZYLOPRIM) 100 MG tablet Take 1 tablet (100 mg total) by mouth daily. 90 tablet 3  . amiodarone (PACERONE) 200 MG tablet Take 1 tablet (200 mg total) by mouth daily. 30 tablet 0  . ANORO ELLIPTA 62.5-25 MCG/INH AEPB USE 1 INHALATION DAILY (Patient taking differently: Inhale 1 puff into the lungs daily. ) 60 each 0  . apixaban (ELIQUIS) 5 MG TABS tablet Take 5 mg by mouth 2 (two) times daily.     . colchicine 0.6 MG tablet Take 0.6 mg by mouth as needed.     . Cyanocobalamin 1000 MCG SUBL Place 1 tablet (1,000 mcg total) under the tongue every morning. 90 tablet 3  . escitalopram (LEXAPRO) 10 MG tablet Take 1 tablet (10 mg total) by mouth daily. 90 tablet 3  . isosorbide mononitrate (IMDUR) 30 MG 24 hr tablet Take 30 mg by mouth daily.  11  . magnesium oxide (MAG-OX) 400 MG tablet Take 400 mg by mouth 2 (two) times daily.    . metoprolol succinate (TOPROL-XL) 50 MG 24 hr tablet Take 1 tablet (50 mg total) by mouth daily. Take with or immediately following a meal. 30 tablet 0  . montelukast (SINGULAIR) 10 MG tablet Take 1 tablet (10 mg total) by mouth at bedtime. 90 tablet 3  . pantoprazole (PROTONIX) 40 MG tablet Take 1 tablet (40 mg total) by mouth daily. 90 tablet 3  . pravastatin (PRAVACHOL) 80 MG tablet Take 80 mg by mouth daily.    Marland Kitchen torsemide (DEMADEX) 20 MG tablet Take 1 tablet (20 mg total) by mouth daily. 30 tablet 0   No current facility-administered medications for this visit.     Allergies:   Patient has no known allergies.   Social History:  The patient  reports that he has never smoked. He has never used smokeless tobacco. He reports current alcohol use of about 4.0 standard drinks of alcohol per week. He reports that he does not use drugs.   Family History:   family history includes Cancer in his  father; Emphysema in his father; Heart disease in his maternal grandmother and paternal grandmother; Hypertension in his father and mother.    Review of Systems: Review of Systems  Constitutional: Negative.   HENT: Negative.   Respiratory: Positive for shortness of breath.   Cardiovascular: Positive for leg swelling.  Gastrointestinal: Negative.   Musculoskeletal: Negative.   Neurological: Negative.   Psychiatric/Behavioral: Negative.   All other systems reviewed and are negative.   PHYSICAL EXAM: VS:  BP (!) 112/54 (BP Location: Right Arm, Patient Position: Sitting, Cuff Size: Normal)   Pulse 70   Ht 6\' 2"  (1.88 m)   Wt 221 lb (100.2 kg)   SpO2 98%   BMI 28.37 kg/m  , BMI Body mass index is 28.37 kg/m. GEN: Well nourished, well developed, in no acute distress HEENT: normal Neck: no JVD, carotid bruits, or masses Cardiac: RRR; no murmurs, rubs, or gallops, 2+ pitting edema lower extremities Respiratory:  Clear with dullness at the bases one third of the way up bilaterally GI: soft, nontender, nondistended, + BS MS: no deformity or atrophy Skin: warm and dry, no rash Neuro:  Strength and sensation are intact Psych: euthymic mood, full affect    Recent Labs: 08/17/2019: Pro B Natriuretic peptide (BNP) 1,124.0  09/12/2019: Magnesium 2.3 09/30/2019: B Natriuretic Peptide 1,277.0; TSH 2.104 11/14/2019: ALT 8; BUN 29; Creatinine, Ser 2.66; Potassium 4.0; Sodium 137 11/23/2019: Hemoglobin 8.8; Platelets 82    Lipid Panel Lab Results  Component Value Date   CHOL 147 05/28/2019   HDL 38.50 (L) 05/28/2019   LDLCALC 89 05/28/2019   TRIG 102.0 05/28/2019      Wt Readings from Last 3 Encounters:  11/26/19 221 lb (100.2 kg)  11/12/19 217 lb (98.4 kg)  10/27/19 223 lb (101.2 kg)      ASSESSMENT AND PLAN:  Problem List Items Addressed This Visit      Cardiology Problems   Chronic systolic heart failure (HCC) - Primary   Relevant Orders   EKG 12-Lead   Atrial  fibrillation (HCC)   Relevant Orders   EKG 12-Lead   V tach ([place])   Ischemic cardiomyopathy    Other Visit Diagnoses    CKD (chronic kidney disease) stage 4, GFR 15-29 ml/min (HCC)       ICD (implantable cardioverter-defibrillator) in place         Acute on chronic systolic CHF Worsening lower extremity edema , also in the setting of anemia Worsening renal failure Suspect component of cardiorenal syndrome with lymphedema/venous insufficiency, third spacing from anemia Albumin stable above 3 despite poor intake/anorexia ----Problems seem to start with cut back in his torsemide from 60/40 daily down to 20 daily Given worsening symptoms we have recommended he increase torsemide up to 40 in the morning 20 after lunch Labs in 1 week for close monitoring of his renal function Recommend he moderate his fluid intake REDS VEST 33 is not particularly elevated and may indicate a component of lymphedema/venous insufficiency, exacerbated by anemia -Recommend for this he use compression hose    Disposition:   F/U  1 month  Long discussion with him on today's visit, numerous records reviewed from [facility] Complicated cardiac history with 40-day hospital course   Total encounter time more than 60 minutes  Greater than 50% was spent in counseling and coordination of care with the patient    Signed, Esmond Plants, M.D., Ph.D. [place], [place]

## 2019-11-26 ENCOUNTER — Other Ambulatory Visit: Payer: Self-pay

## 2019-11-26 ENCOUNTER — Ambulatory Visit (INDEPENDENT_AMBULATORY_CARE_PROVIDER_SITE_OTHER): Payer: Medicare Other | Admitting: Cardiovascular Disease

## 2019-11-26 ENCOUNTER — Encounter: Payer: Self-pay | Admitting: Cardiovascular Disease

## 2019-11-26 VITALS — BP 112/54 | HR 70 | Ht 74.0 in | Wt 221.0 lb

## 2019-11-26 DIAGNOSIS — I472 Ventricular tachycardia, unspecified: Secondary | ICD-10-CM

## 2019-11-26 DIAGNOSIS — N183 Chronic kidney disease, stage 3 unspecified: Secondary | ICD-10-CM | POA: Diagnosis not present

## 2019-11-26 DIAGNOSIS — I255 Ischemic cardiomyopathy: Secondary | ICD-10-CM | POA: Diagnosis not present

## 2019-11-26 DIAGNOSIS — I2510 Atherosclerotic heart disease of native coronary artery without angina pectoris: Secondary | ICD-10-CM | POA: Diagnosis not present

## 2019-11-26 DIAGNOSIS — N184 Chronic kidney disease, stage 4 (severe): Secondary | ICD-10-CM | POA: Diagnosis not present

## 2019-11-26 DIAGNOSIS — I5022 Chronic systolic (congestive) heart failure: Secondary | ICD-10-CM

## 2019-11-26 DIAGNOSIS — Z95810 Presence of automatic (implantable) cardiac defibrillator: Secondary | ICD-10-CM

## 2019-11-26 DIAGNOSIS — I130 Hypertensive heart and chronic kidney disease with heart failure and stage 1 through stage 4 chronic kidney disease, or unspecified chronic kidney disease: Secondary | ICD-10-CM | POA: Diagnosis not present

## 2019-11-26 DIAGNOSIS — I0981 Rheumatic heart failure: Secondary | ICD-10-CM | POA: Diagnosis not present

## 2019-11-26 DIAGNOSIS — D649 Anemia, unspecified: Secondary | ICD-10-CM | POA: Diagnosis not present

## 2019-11-26 DIAGNOSIS — I482 Chronic atrial fibrillation, unspecified: Secondary | ICD-10-CM

## 2019-11-26 LAB — PROTEIN ELECTROPHORESIS, SERUM
A/G Ratio: 1.5 (ref 0.7–1.7)
Albumin ELP: 3.8 g/dL (ref 2.9–4.4)
Alpha-1-Globulin: 0.3 g/dL (ref 0.0–0.4)
Alpha-2-Globulin: 0.6 g/dL (ref 0.4–1.0)
Beta Globulin: 0.9 g/dL (ref 0.7–1.3)
Gamma Globulin: 0.7 g/dL (ref 0.4–1.8)
Globulin, Total: 2.5 g/dL (ref 2.2–3.9)
Total Protein ELP: 6.3 g/dL (ref 6.0–8.5)

## 2019-11-26 LAB — ERYTHROPOIETIN: Erythropoietin: 37.5 m[IU]/mL — ABNORMAL HIGH (ref 2.6–18.5)

## 2019-11-26 MED ORDER — TORSEMIDE 20 MG PO TABS: ORAL_TABLET | ORAL | 6 refills | Status: DC

## 2019-11-26 NOTE — Patient Instructions (Addendum)
  Medication Instructions:  Please increase torsemide up to 40 mg in the AM, 20 mg in the Pm   If you need a refill on your cardiac medications before your next appointment, please call your pharmacy.    Lab work: 1- Labs in 1 week. Either with advanced home care or [facility]   If you have labs (blood work) drawn today and your tests are completely normal, you will receive your results only by: Marland Kitchen MyChart Message (if you have MyChart) OR . A paper copy in the mail If you have any lab test that is abnormal or we need to change your treatment, we will call you to review the results.   Testing/Procedures: No new testing needed   Follow-Up: At [facility], you and your health needs are our priority.  As part of our continuing mission to provide you with exceptional heart care, we have created designated Provider Care Teams.  These Care Teams include your primary Cardiologist (physician) and Advanced Practice Providers (APPs -  Physician Assistants and Nurse Practitioners) who all work together to provide you with the care you need, when you need it.  . You will need a follow up appointment in 1 month  . Providers on your designated Care Team:   . Murray Hodgkins, NP . Christell Faith, PA-C . Marrianne Mood, PA-C  Any Other Special Instructions Will Be Listed Below (If Applicable).  For educational health videos Log in to : [email or web address] Or : [email or web address], password : [email or web address]

## 2019-11-27 ENCOUNTER — Encounter: Payer: Self-pay | Admitting: Oncology

## 2019-11-27 ENCOUNTER — Other Ambulatory Visit: Payer: Self-pay

## 2019-11-27 ENCOUNTER — Telehealth: Payer: Self-pay | Admitting: Internal Medicine

## 2019-11-27 DIAGNOSIS — I130 Hypertensive heart and chronic kidney disease with heart failure and stage 1 through stage 4 chronic kidney disease, or unspecified chronic kidney disease: Secondary | ICD-10-CM | POA: Diagnosis not present

## 2019-11-27 DIAGNOSIS — I0981 Rheumatic heart failure: Secondary | ICD-10-CM | POA: Diagnosis not present

## 2019-11-27 DIAGNOSIS — I2510 Atherosclerotic heart disease of native coronary artery without angina pectoris: Secondary | ICD-10-CM | POA: Diagnosis not present

## 2019-11-27 DIAGNOSIS — I5022 Chronic systolic (congestive) heart failure: Secondary | ICD-10-CM | POA: Diagnosis not present

## 2019-11-27 DIAGNOSIS — N183 Chronic kidney disease, stage 3 unspecified: Secondary | ICD-10-CM | POA: Diagnosis not present

## 2019-11-27 DIAGNOSIS — I255 Ischemic cardiomyopathy: Secondary | ICD-10-CM | POA: Diagnosis not present

## 2019-11-27 NOTE — Progress Notes (Signed)
Patient prescreened for appointment. Patient has no concerns or questions.  

## 2019-11-27 NOTE — Telephone Encounter (Signed)
Spoke with Joya Gaskins and confirmed with her that the pt did do right by increasing the lasix to 40mg  until he is back to his original weight. Once back to original weight he should go back to taking the 20mg  daily. Then do the same thing if he sees the 2lb overnight gain or the 5lb weekly gain. Joya Gaskins gave a verbal understanding.

## 2019-11-27 NOTE — Telephone Encounter (Signed)
Joya Gaskins physical therapist called, [phone]. pt has gained 6 pounds in 1w. He took his fluid pill, he changed dose from 20mg  to 40mg , pt stated that Dr Derrel Nip told him to do that. Glory Buff wanted to confirm that dosage. Also has not made an appt with his new cardiologist.

## 2019-11-27 NOTE — Telephone Encounter (Signed)
LMTCB with Rick Gilmore

## 2019-11-27 NOTE — Telephone Encounter (Signed)
Yes you can tell him that I advise patients with CHF to   Advised patient to continue to monitor wt daily and double the lasix dose for overnight weight gain of 2 lbs or for a weekly weight gain of 5 lbs.

## 2019-11-28 ENCOUNTER — Inpatient Hospital Stay (HOSPITAL_BASED_OUTPATIENT_CLINIC_OR_DEPARTMENT_OTHER): Payer: Medicare Other | Admitting: Oncology

## 2019-11-28 ENCOUNTER — Other Ambulatory Visit: Payer: Self-pay

## 2019-11-28 ENCOUNTER — Inpatient Hospital Stay: Payer: Medicare Other

## 2019-11-28 VITALS — BP 100/56 | HR 76 | Temp 95.8°F | Wt 221.0 lb

## 2019-11-28 DIAGNOSIS — D631 Anemia in chronic kidney disease: Secondary | ICD-10-CM | POA: Diagnosis not present

## 2019-11-28 DIAGNOSIS — Z8042 Family history of malignant neoplasm of prostate: Secondary | ICD-10-CM

## 2019-11-28 DIAGNOSIS — N189 Chronic kidney disease, unspecified: Secondary | ICD-10-CM

## 2019-11-28 DIAGNOSIS — D649 Anemia, unspecified: Secondary | ICD-10-CM

## 2019-11-28 DIAGNOSIS — Z808 Family history of malignant neoplasm of other organs or systems: Secondary | ICD-10-CM

## 2019-11-28 DIAGNOSIS — E538 Deficiency of other specified B group vitamins: Secondary | ICD-10-CM | POA: Diagnosis not present

## 2019-11-28 MED ORDER — FOLIC ACID 1 MG PO TABS: 1.0000 mg | ORAL_TABLET | Freq: Every day | ORAL | 1 refills | Status: DC

## 2019-11-28 MED ORDER — EPOETIN ALFA-EPBX 40000 UNIT/ML IJ SOLN
40000.0000 [IU] | Freq: Once | INTRAMUSCULAR | Status: AC
  Administered 2019-11-28: 11:00:00 40000 [IU] via SUBCUTANEOUS
  Filled 2019-11-28: qty 1

## 2019-11-28 NOTE — Progress Notes (Signed)
[place]  [phone] [phone]  ID: [name] OB: 1941-08-09  MR#: [id number]  ZJI#:[id number]  Patient Care Team: Crecencio Mc, MD as PCP - General (Internal Medicine) Manya Silvas, MD (Inactive) (Gastroenterology) Corey Skains, MD as Consulting Physician (Cardiology) Alisa Graff, FNP as Nurse Practitioner (Family Medicine) Vilinda Boehringer, MD (Inactive) as Consulting Physician (Pulmonary Disease)  CHIEF COMPLAINT: Anemia secondary to chronic renal insufficiency.  INTERVAL HISTORY: Patient is a 78 year old male who was last evaluated in clinic greater than 4 years ago is referred back for persistent anemia.  He currently feels well and is asymptomatic.  He does not complain of weakness and fatigue.  He has no neurologic complaints.  He has good appetite and denies weight loss.  He has no chest pain, shortness of breath, cough, or hemoptysis.  He denies any nausea, vomiting, constipation, or diarrhea.  He has no melena or hematochezia.  He has no urinary complaints.  Patient feels at his baseline offers no specific complaints today.  REVIEW OF SYSTEMS:   Review of Systems  Constitutional: Negative.  Negative for fever, malaise/fatigue and weight loss.  Respiratory: Negative.  Negative for cough, hemoptysis and shortness of breath.   Cardiovascular: Negative.  Negative for chest pain and leg swelling.  Gastrointestinal: Negative.  Negative for abdominal pain, blood in stool and melena.  Genitourinary: Negative.  Negative for dysuria and hematuria.  Musculoskeletal: Negative.  Negative for back pain.  Skin: Negative for rash.  Neurological: Negative.  Negative for dizziness, focal weakness, weakness and headaches.  Psychiatric/Behavioral: Negative.  The patient is not nervous/anxious.     As per HPI. Otherwise, a complete review of systems is negative.  PAST MEDICAL HISTORY: Past Medical History:  Diagnosis Date  . AICD  (automatic cardioverter/defibrillator) present   . Anemia   . Arrhythmia    a fib  . Atrial fibrillation ([place])    on Coumadin  . CAD (coronary artery disease)   . Cancer [facility]) 2001   Prostate, XRT and implant  . CHF (congestive heart failure) ([place])   . COPD (chronic obstructive pulmonary disease) ([place])   . Erectile dysfunction   . GERD (gastroesophageal reflux disease)   . Hyperlipidemia   . Hypertension   . Ischemic cardiomyopathy    s/p AICD/pacer 2009, replaced 2010 [facility]  . MI (myocardial infarction) ([place]) 1988  . Personal history of prostate cancer 2001   s/p XRT and implant ([place])  . Prostate cancer ([place])   . Sleep apnea, obstructive    uses CPAP nightly  . Thrombocytopenia ([place])   . Tubular adenoma    colon polyp  . Vitamin B 12 deficiency     PAST SURGICAL HISTORY: Past Surgical History:  Procedure Laterality Date  . CARDIAC CATHETERIZATION    . COLONOSCOPY WITH PROPOFOL [name] 05/30/2015   Procedure: COLONOSCOPY WITH PROPOFOL;  Surgeon: Manya Silvas, MD;  Location: [facility] ENDOSCOPY;  Service: Endoscopy;  Laterality: [name];  . COLONOSCOPY WITH PROPOFOL [name] 07/16/2019   Procedure: COLONOSCOPY WITH PROPOFOL;  Surgeon: Toledo, Benay Pike, MD;  Location: [facility] ENDOSCOPY;  Service: Gastroenterology;  Laterality: [name];  . CORONARY ARTERY BYPASS GRAFT  1988  . ESOPHAGOGASTRODUODENOSCOPY [name] 05/30/2015   Procedure: ESOPHAGOGASTRODUODENOSCOPY (EGD);  Surgeon: Manya Silvas, MD;  Location: [facility] ENDOSCOPY;  Service: Endoscopy;  Laterality: [name];  . ESOPHAGOGASTRODUODENOSCOPY (EGD) WITH PROPOFOL [name] 07/16/2019   Procedure: ESOPHAGOGASTRODUODENOSCOPY (EGD) WITH PROPOFOL;  Surgeon: Toledo, Benay Pike, MD;  Location: [facility] ENDOSCOPY;  Service: Gastroenterology;  Laterality: [name];  . IMPLANTABLE CARDIOVERTER DEFIBRILLATOR (ICD) GENERATOR CHANGE Left 10/02/2019   Procedure: ICD GENERATOR CHANGE;  Surgeon: Isaias Cowman, MD;  Location: [facility] ORS;  Service: Cardiovascular;  Laterality:  Left;  . IMPLANTABLE CARDIOVERTER DEFIBRILLATOR GENERATOR CHANGE  April 2014   Bi V RRR  . INSERT / REPLACE / REMOVE PACEMAKER  2014  . VASECTOMY      FAMILY HISTORY: Family History  Problem Relation Age of Onset  . Hypertension Mother   . Hypertension Father   . Cancer Father        prostate, throat  . Emphysema Father   . Heart disease Maternal Grandmother   . Heart disease Paternal Grandmother     ADVANCED DIRECTIVES (Y/N):  N  HEALTH MAINTENANCE: Social History   Tobacco Use  . Smoking status: Never Smoker  . Smokeless tobacco: Never Used  Substance Use Topics  . Alcohol use: Yes    Alcohol/week: 4.0 standard drinks    Types: 2 Standard drinks or equivalent, 2 Cans of beer per week    Comment: Mixed drinks  . Drug use: No     Colonoscopy:  PAP:  Bone density:  Lipid panel:  No Known Allergies  Current Outpatient Medications  Medication Sig Dispense Refill  . albuterol (PROVENTIL HFA;VENTOLIN HFA) 108 (90 Base) MCG/ACT inhaler Inhale 2 puffs into the lungs every 6 (six) hours as needed for wheezing or shortness of breath. 1 Inhaler 1  . allopurinol (ZYLOPRIM) 100 MG tablet Take 1 tablet (100 mg total) by mouth daily. 90 tablet 3  . amiodarone (PACERONE) 200 MG tablet Take 1 tablet (200 mg total) by mouth daily. 30 tablet 0  . ANORO ELLIPTA 62.5-25 MCG/INH AEPB USE 1 INHALATION DAILY (Patient taking differently: Inhale 1 puff into the lungs daily. ) 60 each 0  . apixaban (ELIQUIS) 5 MG TABS tablet Take 5 mg by mouth 2 (two) times daily.     . colchicine 0.6 MG tablet Take 0.6 mg by mouth as needed.     . Cyanocobalamin 1000 MCG SUBL Place 1 tablet (1,000 mcg total) under the tongue every morning. 90 tablet 3  . escitalopram (LEXAPRO) 10 MG tablet Take 1 tablet (10 mg total) by mouth daily. 90 tablet 3  . folic acid (FOLVITE) 1 MG tablet Take 1 tablet (1 mg total) by mouth daily. 90 tablet 1  . isosorbide mononitrate (IMDUR) 30 MG 24 hr tablet Take 30 mg by mouth  daily.  11  . magnesium oxide (MAG-OX) 400 MG tablet Take 400 mg by mouth 2 (two) times daily.    . metoprolol succinate (TOPROL-XL) 50 MG 24 hr tablet Take 1 tablet (50 mg total) by mouth daily. Take with or immediately following a meal. 30 tablet 0  . montelukast (SINGULAIR) 10 MG tablet Take 1 tablet (10 mg total) by mouth at bedtime. 90 tablet 3  . pantoprazole (PROTONIX) 40 MG tablet Take 1 tablet (40 mg total) by mouth daily. 90 tablet 3  . pravastatin (PRAVACHOL) 80 MG tablet Take 80 mg by mouth daily.    Marland Kitchen torsemide (DEMADEX) 20 MG tablet Take 2 tablets (40 mg total) in the morning and 1 tablet (20 mg total) in the afternoon daily. 90 tablet 6   No current facility-administered medications for this visit.    OBJECTIVE: Vitals:   11/28/19 1006  BP: (!) 100/56  Pulse: 76  Temp: (!) 95.8 F (35.4 C)     Body mass index is 28.37 kg/m.  ECOG FS:0 - Asymptomatic  General: Well-developed, well-nourished, no acute distress. Eyes: Pink conjunctiva, anicteric sclera. HEENT: Normocephalic, moist mucous membranes. Lungs: No audible wheezing or coughing. Heart: Regular rate and rhythm. Abdomen: Soft, nontender, no obvious distention. Musculoskeletal: No edema, cyanosis, or clubbing. Neuro: Alert, answering all questions appropriately. Cranial nerves grossly intact. Skin: No rashes or petechiae noted. Psych: Normal affect. Lymphatics: No cervical, calvicular, axillary or inguinal LAD.   LAB RESULTS:  Lab Results  Component Value Date   NA 137 11/14/2019   K 4.0 11/14/2019   CL 103 11/14/2019   CO2 22 11/14/2019   GLUCOSE 104 (H) 11/14/2019   BUN 29 (H) 11/14/2019   CREATININE 2.66 (H) 11/14/2019   CALCIUM 8.9 11/14/2019   PROT 6.5 11/14/2019   ALBUMIN 3.9 11/14/2019   AST 15 11/14/2019   ALT 8 11/14/2019   ALKPHOS 75 11/14/2019   BILITOT 0.8 11/14/2019   GFRNONAA 38 (L) 10/27/2019   GFRAA 44 (L) 10/27/2019    Lab Results  Component Value Date   WBC 6.1 11/23/2019    NEUTROABS 4.6 11/14/2019   HGB 8.8 (L) 11/23/2019   HCT 29.2 (L) 11/23/2019   MCV 99.0 11/23/2019   PLT 82 (L) 11/23/2019   Lab Results  Component Value Date   IRON 62 11/14/2019   TIBC 299 11/14/2019   IRONPCTSAT 21 11/14/2019   Lab Results  Component Value Date   FERRITIN 311 11/14/2019     STUDIES: No results found.  ASSESSMENT: Anemia secondary to chronic renal insufficiency.  PLAN:    1. Anemia secondary to chronic renal insufficiency: Patient's hemoglobin is decreased to below 10.0, therefore he will benefit from 40,000 units Retacrit today.  He has a mild folic acid deficiency and was given supplements today, but otherwise the remainder of his laboratory work was either negative or within normal limits.  Will draw [facility] myeloid extended panel with next lab draw.  Erythropoietin levels are mildly elevated, but still inappropriately low for this level of anemia.  Proceed with treatment today.  Patient will return every 4 weeks for laboratory work and consideration of Retacrit if his hemoglobin remains below 10.0.  He was then return to clinic in 4 months with repeat laboratory can further evaluation. 2.  Folic acid deficiency: Patient given a prescription for 1 mg folate daily. 3.  Chronic renal insufficiency: Patient reports he has an appointment with nephrology later this week.  I spent a total of 45 minutes face-to-face with the patient and reviewing chart data of which greater than 50% of the visit was spent in counseling and coordination of care as detailed above.  Patient expressed understanding and was in agreement with this plan. He also understands that He can call clinic at any time with any questions, concerns, or complaints.   Cancer Staging No matching staging information was found for the patient.  Lloyd Huger, MD   11/28/2019 12:10 PM

## 2019-12-03 ENCOUNTER — Telehealth: Payer: Self-pay | Admitting: Cardiovascular Disease

## 2019-12-03 ENCOUNTER — Telehealth: Payer: Self-pay | Admitting: Internal Medicine

## 2019-12-03 DIAGNOSIS — N183 Chronic kidney disease, stage 3 unspecified: Secondary | ICD-10-CM | POA: Diagnosis not present

## 2019-12-03 DIAGNOSIS — I130 Hypertensive heart and chronic kidney disease with heart failure and stage 1 through stage 4 chronic kidney disease, or unspecified chronic kidney disease: Secondary | ICD-10-CM | POA: Diagnosis not present

## 2019-12-03 DIAGNOSIS — I0981 Rheumatic heart failure: Secondary | ICD-10-CM | POA: Diagnosis not present

## 2019-12-03 DIAGNOSIS — I5022 Chronic systolic (congestive) heart failure: Secondary | ICD-10-CM | POA: Diagnosis not present

## 2019-12-03 DIAGNOSIS — I2510 Atherosclerotic heart disease of native coronary artery without angina pectoris: Secondary | ICD-10-CM | POA: Diagnosis not present

## 2019-12-03 DIAGNOSIS — I255 Ischemic cardiomyopathy: Secondary | ICD-10-CM | POA: Diagnosis not present

## 2019-12-03 NOTE — Telephone Encounter (Signed)
Rick Gilmore, [facility] PT, calling in regarding patient and his lasix. Patient does not know his current instructions since last visit and his therapist would like to help him with them. Please call Jatana at [phone]

## 2019-12-03 NOTE — Telephone Encounter (Signed)
Jatana from [facility] called, Pt has a stable in his chest remaining, getting red, it needs to be removed. Also, his cardiologist raised his fluid pill dosage even higher then what Dr. Derrel Nip raised. Cardiologist changed instructions. Patient still has up and down weight gain. Please call her at [phone]. Caregiver has called cardiologist. Requesting additional therapy, 2x a week, just for next.

## 2019-12-04 LAB — BASIC METABOLIC PANEL
BUN/Creatinine Ratio: 15 (ref 10–24)
BUN: 35 mg/dL — ABNORMAL HIGH (ref 8–27)
CO2: 26 mmol/L (ref 20–29)
Calcium: 9 mg/dL (ref 8.6–10.2)
Chloride: 97 mmol/L (ref 96–106)
Creatinine, Ser: 2.28 mg/dL — ABNORMAL HIGH (ref 0.76–1.27)
GFR calc Af Amer: 31 mL/min/{1.73_m2} — ABNORMAL LOW (ref >59)
GFR calc non Af Amer: 26 mL/min/{1.73_m2} — ABNORMAL LOW (ref >59)
Glucose: 100 mg/dL — ABNORMAL HIGH (ref 65–99)
Potassium: 4.2 mmol/L (ref 3.5–5.2)
Sodium: 139 mmol/L (ref 134–144)

## 2019-12-04 NOTE — Telephone Encounter (Signed)
Verbal orders given  

## 2019-12-04 NOTE — Telephone Encounter (Signed)
Spoke with [place] with [facility] and she stated that the pt did not want to go all the way back to [facility] just to have one staple removed. She stated that she advised the daughter to see if she could find a doctor that would be willing to take it out. Explained to Glory Buff that if the daughter had not found a doctor to remove it that Dr. Derrel Nip would like to have someone take a picture of the staple and the area around it and upload it to his mychart so she could take a look at it. Glory Buff gave a verbal understanding and stated that she would be going out to visit the pt tomorrow.

## 2019-12-04 NOTE — Telephone Encounter (Signed)
Can the home health rn remove the staple ? I do not have any appointments  Tomorrow or Thursday  And he should have had it removed by now a cardiology follow up   Yes she can continue to see him

## 2019-12-04 NOTE — Telephone Encounter (Signed)
Spoke with [place] advanced home care provider and she wanted to confirm medications. She said he looks well put together but he is very confused and wanted to identify these issues so all providers can be on the same page. He can appear organized and together but after a few visits she states that she has noticed his confusion and hides it well.   She also wants Dr. Rockey Situ to be aware that patient reported he was taking Torsemide 20 mg once daily at last visit but in fact he was taking Torsemide 40 mg for some time prior to that visit. Reviewed current dosing ordered and that I would make Dr. Rockey Situ aware. Patient also sees Otila Kluver in [facility] as well.   On the 24th pt started taking 60 mg in the morning and 20 in the evening and still gained 2 pounds and since his last visit with [place] he has been up total of 4 pounds. Pt lives alone and daughter comes in to do his pills but both daughter and patient were unclear of instructions.    She also reports that he has one staple under steri strips from procedure back in November. She states it is a little red but requires order for it to be removed. She did call other providers to see if they would assist with this but unable to get it done. Provided her with verbal order to remove the one staple. I did advise that if infection or wound care is needed then surgeon or PCP will need to take care of those orders. Let her know that I would bring this to Dr. Donivan Scull recommendations and she can also reach out to HF clinic if further adjustments are needed. She verbalized understanding of our conversation, agreement with plan, and had no further questions at this time.

## 2019-12-05 ENCOUNTER — Telehealth (HOSPITAL_COMMUNITY): Payer: Self-pay

## 2019-12-05 DIAGNOSIS — N183 Chronic kidney disease, stage 3 unspecified: Secondary | ICD-10-CM | POA: Diagnosis not present

## 2019-12-05 DIAGNOSIS — I5022 Chronic systolic (congestive) heart failure: Secondary | ICD-10-CM | POA: Diagnosis not present

## 2019-12-05 DIAGNOSIS — I255 Ischemic cardiomyopathy: Secondary | ICD-10-CM | POA: Diagnosis not present

## 2019-12-05 DIAGNOSIS — I130 Hypertensive heart and chronic kidney disease with heart failure and stage 1 through stage 4 chronic kidney disease, or unspecified chronic kidney disease: Secondary | ICD-10-CM | POA: Diagnosis not present

## 2019-12-05 DIAGNOSIS — I2510 Atherosclerotic heart disease of native coronary artery without angina pectoris: Secondary | ICD-10-CM | POA: Diagnosis not present

## 2019-12-05 DIAGNOSIS — I0981 Rheumatic heart failure: Secondary | ICD-10-CM | POA: Diagnosis not present

## 2019-12-05 NOTE — Telephone Encounter (Signed)
Contacted [facility] and have a home appt scheduled for Jan 6 at his home.   [place] [phone]

## 2019-12-05 NOTE — Telephone Encounter (Signed)
Spoke with Steffanie Dunn regarding patient concerns and she is going to reach out to his family for further information and to possibly set up appointment to go out.

## 2019-12-10 DIAGNOSIS — I255 Ischemic cardiomyopathy: Secondary | ICD-10-CM | POA: Diagnosis not present

## 2019-12-10 DIAGNOSIS — I5022 Chronic systolic (congestive) heart failure: Secondary | ICD-10-CM | POA: Diagnosis not present

## 2019-12-10 DIAGNOSIS — N183 Chronic kidney disease, stage 3 unspecified: Secondary | ICD-10-CM | POA: Diagnosis not present

## 2019-12-10 DIAGNOSIS — I0981 Rheumatic heart failure: Secondary | ICD-10-CM | POA: Diagnosis not present

## 2019-12-10 DIAGNOSIS — I130 Hypertensive heart and chronic kidney disease with heart failure and stage 1 through stage 4 chronic kidney disease, or unspecified chronic kidney disease: Secondary | ICD-10-CM | POA: Diagnosis not present

## 2019-12-10 DIAGNOSIS — I2510 Atherosclerotic heart disease of native coronary artery without angina pectoris: Secondary | ICD-10-CM | POA: Diagnosis not present

## 2019-12-11 NOTE — Telephone Encounter (Signed)
Spoke with Steffanie Dunn EMT and requested that she please look to see if he still has staple in place. She will assess this and call us with any questions.

## 2019-12-12 ENCOUNTER — Other Ambulatory Visit (HOSPITAL_COMMUNITY): Payer: Self-pay

## 2019-12-12 DIAGNOSIS — N183 Chronic kidney disease, stage 3 unspecified: Secondary | ICD-10-CM | POA: Diagnosis not present

## 2019-12-12 DIAGNOSIS — I0981 Rheumatic heart failure: Secondary | ICD-10-CM | POA: Diagnosis not present

## 2019-12-12 DIAGNOSIS — I255 Ischemic cardiomyopathy: Secondary | ICD-10-CM | POA: Diagnosis not present

## 2019-12-12 DIAGNOSIS — I5022 Chronic systolic (congestive) heart failure: Secondary | ICD-10-CM | POA: Diagnosis not present

## 2019-12-12 DIAGNOSIS — I2510 Atherosclerotic heart disease of native coronary artery without angina pectoris: Secondary | ICD-10-CM | POA: Diagnosis not present

## 2019-12-12 DIAGNOSIS — I130 Hypertensive heart and chronic kidney disease with heart failure and stage 1 through stage 4 chronic kidney disease, or unspecified chronic kidney disease: Secondary | ICD-10-CM | POA: Diagnosis not present

## 2019-12-13 ENCOUNTER — Telehealth: Payer: Self-pay

## 2019-12-13 ENCOUNTER — Encounter (HOSPITAL_COMMUNITY): Payer: Self-pay

## 2019-12-13 NOTE — Telephone Encounter (Signed)
Received a refill request for Metolazone but it looks like medication was stopped when pt was discharged from the hospital.

## 2019-12-13 NOTE — Telephone Encounter (Signed)
CALL PATIENT AND FIND OUT IF HE IS STILL USING IT

## 2019-12-13 NOTE — Telephone Encounter (Signed)
Spoke with pt and he stated that he is not taking this medication anymore.

## 2019-12-13 NOTE — Progress Notes (Signed)
Today had a home visit with Rick Gilmore. Today was first visit with him.  Discussed the program and he is willing to be part of it. [facility] was worried about a staple he had from his surgery, took a look at that, he went to an urgent care and had it removed, the scar is not red or swollen.  Appears to be healing well.  He states been doing pretty good.  Verified his medications, his daughter places his meds in his box for him.  He does know what to take and how.  We discussed torsemide because of the dose change, he is taking what has been ordered.  His weight has went up a little in past month.  He has some swelling in extremities, plus 2 edema.  He is not wearing socks.  He says when he goes out he puts on ted hose.  Advised him to wear them everyday, he states he will start.  Explained the importance of it.  Discussed diet, he has salt on the take, he says on some foods he just needs it.  Discussed different salt substitutes that may taste similar.  Discussed how much water he should be drinking or liquids.  He has not been measuring.  Took a glass he normally drinks out of and discussed how many he should be drinking.  Advised him if he notices weight gain of 3 lbs overnight or 5 lbs in a week to let me know.  He appeared to understand.  He lives alone but has a daughter that checks on him.  He tries to stay active as much as he can.  He is able to ambulate through his house and do most things for his self.  He does have home health with PT and OT right now.  Will reach out to his daughter to see what she thinks he may need more help with.  Will continue to visit for heart failure.   [place] [phone]

## 2019-12-17 ENCOUNTER — Other Ambulatory Visit: Payer: Self-pay

## 2019-12-17 MED ORDER — TORSEMIDE 20 MG PO TABS: ORAL_TABLET | ORAL | 0 refills | Status: DC

## 2019-12-21 NOTE — Telephone Encounter (Signed)
Pt called about wanting to let Dr Nicki Reaper know he sent a Mychart msg.

## 2019-12-25 ENCOUNTER — Telehealth (HOSPITAL_COMMUNITY): Payer: Self-pay

## 2019-12-25 ENCOUNTER — Other Ambulatory Visit: Payer: Self-pay

## 2019-12-25 NOTE — Telephone Encounter (Signed)
Today had a telephone appt with Juanda Crumble.  He states todays weight is 230 lbs. He is up 8 lbs in 7 days.  He has contacted cardiology and they have increased his torsemide to 2 twice daily.  He had a 1 lbs loss since started new regiman yesterday.  He states legs have some swelling but feet does not.  He still using table salt on some foods.  Discussed diet and fluids again.  He did states he is wearing socks in the house now.  Advised him if he has a weight gain to contact me and he advised he understood.  He is aware of upcoming appts including HF clinic appt and cardiology appt.  He states has all his medications and aware of how to take them.  Will make a home visit next week.  Will continue to visit for heart failure, medication and diet.  He denies chest pain, shortness of breath, headaches or dizziness.  He states just gets a little tired when he does too much.  He lives alone and does simple house chores on his own.    [place] [phone]

## 2019-12-26 ENCOUNTER — Other Ambulatory Visit: Payer: Self-pay

## 2019-12-26 ENCOUNTER — Inpatient Hospital Stay: Payer: Medicare Other | Attending: Oncology

## 2019-12-26 ENCOUNTER — Inpatient Hospital Stay: Payer: Medicare Other

## 2019-12-26 VITALS — BP 97/61 | HR 71

## 2019-12-26 DIAGNOSIS — N189 Chronic kidney disease, unspecified: Secondary | ICD-10-CM | POA: Insufficient documentation

## 2019-12-26 DIAGNOSIS — D649 Anemia, unspecified: Secondary | ICD-10-CM

## 2019-12-26 DIAGNOSIS — D631 Anemia in chronic kidney disease: Secondary | ICD-10-CM | POA: Diagnosis not present

## 2019-12-26 LAB — CBC WITH DIFFERENTIAL/PLATELET
Abs Immature Granulocytes: 0.03 10*3/uL (ref 0.00–0.07)
Basophils Absolute: 0 10*3/uL (ref 0.0–0.1)
Basophils Relative: 1 %
Eosinophils Absolute: 0.1 10*3/uL (ref 0.0–0.5)
Eosinophils Relative: 1 %
HCT: 29.6 % — ABNORMAL LOW (ref 39.0–52.0)
Hemoglobin: 8.8 g/dL — ABNORMAL LOW (ref 13.0–17.0)
Immature Granulocytes: 1 %
Lymphocytes Relative: 18 %
Lymphs Abs: 1.1 10*3/uL (ref 0.7–4.0)
MCH: 29.3 pg (ref 26.0–34.0)
MCHC: 29.7 g/dL — ABNORMAL LOW (ref 30.0–36.0)
MCV: 98.7 fL (ref 80.0–100.0)
Monocytes Absolute: 0.5 10*3/uL (ref 0.1–1.0)
Monocytes Relative: 8 %
Neutro Abs: 4.5 10*3/uL (ref 1.7–7.7)
Neutrophils Relative %: 71 %
Platelets: 100 10*3/uL — ABNORMAL LOW (ref 150–400)
RBC: 3 MIL/uL — ABNORMAL LOW (ref 4.22–5.81)
RDW: 17.6 % — ABNORMAL HIGH (ref 11.5–15.5)
WBC: 6.3 10*3/uL (ref 4.0–10.5)
nRBC: 0 % (ref 0.0–0.2)

## 2019-12-26 LAB — IRON AND TIBC
Iron: 39 ug/dL — ABNORMAL LOW (ref 45–182)
Saturation Ratios: 11 % — ABNORMAL LOW (ref 17.9–39.5)
TIBC: 358 ug/dL (ref 250–450)
UIBC: 319 ug/dL

## 2019-12-26 LAB — FOLATE: Folate: 11.4 ng/mL (ref >5.9)

## 2019-12-26 LAB — FERRITIN: Ferritin: 96 ng/mL (ref 24–336)

## 2019-12-26 MED ORDER — EPOETIN ALFA-EPBX 40000 UNIT/ML IJ SOLN
40000.0000 [IU] | Freq: Once | INTRAMUSCULAR | Status: AC
  Administered 2019-12-26: 40000 [IU] via SUBCUTANEOUS
  Filled 2019-12-26: qty 1

## 2019-12-26 NOTE — Progress Notes (Signed)
Patient ID: Rick Gilmore, male    DOB: 14-Nov-1941, 79 y.o.   MRN: [id number]  HPI  Rick Gilmore is a 79 y/o male with a history of thrombocytopenia, obstructive sleep apnea (with CPAP), prostate cancer, MI, HTN, hyperlipidemia, GERD, COPD, atrial fibrillation, anemia, AICD and chronic heart failure.   Echo report from 10/01/2019 reviewed and showed an EF of 25-30% along with severe Rick, moderate TR, trivial AR/PR and moderately elevated PA pressure. Reviewed echo done 07/04/17 which showed an EF of 30% along with severe Rick/TR. Previous echo was done 09/02/16 and showed an EF of 25% along with severe Rick/TR. EF has decreased from 01/16/16 which showed an EF of 30-35% along with mild Rick/TR.   Admitted 11/08/2019 due to 4 ICD shocks at home. Antibiotics changed. PT/OT consulted Discharged after 2 days. Admitted 10/27/2019 due to infected defibrillator site. ID consult obtained. Antibiotics started. Abscess drained and cultures obtained. Underwent Right sided Med[employer]onic BiV ICD reimplantation on 11/05/2019. Discharged after 10 days. Admitted 09/30/2019 due to acute on chronic HF. Cardiology consult obtained. Troponin thought to be due to demand ischemia.  Discharged after 5 days.  Admitted 09/08/2019 for CHF was diuresed with IV lasix, went into ARF and had cardiology and nephrology consulted. He had a right paracentesis with 2.3L fluid removed. On discharge after 5 days, his entresto was stopped and torsemide decreased to 20mg  daily.   He presents today for his follow-up visit with a chief complaint of moderate shortness of breath upon minimal exertion. He describes this as chronic in nature having been present for several years with varying levels of severity. Does feel like his breathing and his swelling are getting worse. He has associated fatigue, pedal edema, weight gain and easy bruising along with this. He denies any difficulty sleeping, dizziness, abdominal distention, palpitations, chest pain or cough.    Currently taking torsemide 40mg  BID but feels like it needs to be increased. Is unable to wear compression socks because he says that they are painful to wear.   Past Medical History:  Diagnosis Date  . AICD (automatic cardioverter/defibrillator) present   . Anemia   . Arrhythmia    a fib  . Atrial fibrillation ([place])    on Coumadin  . CAD (coronary artery disease)   . Cancer [facility]) 2001   Prostate, XRT and implant  . CHF (congestive heart failure) ([place])   . COPD (chronic obstructive pulmonary disease) ([place])   . Erectile dysfunction   . GERD (gastroesophageal reflux disease)   . Hyperlipidemia   . Hypertension   . Ischemic cardiomyopathy    s/p AICD/pacer 2009, replaced 2010 [facility]  . MI (myocardial infarction) ([place]) 1988  . Personal history of prostate cancer 2001   s/p XRT and implant ([place])  . Prostate cancer ([place])   . Sleep apnea, obstructive    uses CPAP nightly  . Thrombocytopenia ([place])   . Tubular adenoma    colon polyp  . Vitamin B 12 deficiency    Past Surgical History:  Procedure Laterality Date  . CARDIAC CATHETERIZATION    . COLONOSCOPY WITH PROPOFOL [name] 05/30/2015   Procedure: COLONOSCOPY WITH PROPOFOL;  Surgeon: Manya Silvas, MD;  Location: [facility] ENDOSCOPY;  Service: Endoscopy;  Laterality: [name];  . COLONOSCOPY WITH PROPOFOL [name] 07/16/2019   Procedure: COLONOSCOPY WITH PROPOFOL;  Surgeon: Toledo, Benay Pike, MD;  Location: [facility] ENDOSCOPY;  Service: Gastroenterology;  Laterality: [name];  . CORONARY ARTERY BYPASS GRAFT  1988  . ESOPHAGOGASTRODUODENOSCOPY [name] 05/30/2015   Procedure:  ESOPHAGOGASTRODUODENOSCOPY (EGD);  Surgeon: Manya Silvas, MD;  Location: [facility] ENDOSCOPY;  Service: Endoscopy;  Laterality: [name];  . ESOPHAGOGASTRODUODENOSCOPY (EGD) WITH PROPOFOL [name] 07/16/2019   Procedure: ESOPHAGOGASTRODUODENOSCOPY (EGD) WITH PROPOFOL;  Surgeon: Toledo, Benay Pike, MD;  Location: [facility] ENDOSCOPY;  Service: Gastroenterology;  Laterality: [name];  . IMPLANTABLE CARDIOVERTER  DEFIBRILLATOR (ICD) GENERATOR CHANGE Left 10/02/2019   Procedure: ICD GENERATOR CHANGE;  Surgeon: Isaias Cowman, MD;  Location: [facility] ORS;  Service: Cardiovascular;  Laterality: Left;  . IMPLANTABLE CARDIOVERTER DEFIBRILLATOR GENERATOR CHANGE  April 2014   Bi V RRR  . INSERT / REPLACE / REMOVE PACEMAKER  2014  . VASECTOMY     Family History  Problem Relation Age of Onset  . Hypertension Mother   . Hypertension Father   . Cancer Father        prostate, throat  . Emphysema Father   . Heart disease Maternal Grandmother   . Heart disease Paternal Grandmother    Social History   Tobacco Use  . Smoking status: Never Smoker  . Smokeless tobacco: Never Used  Substance Use Topics  . Alcohol use: Yes    Alcohol/week: 4.0 standard drinks    Types: 2 Standard drinks or equivalent, 2 Cans of beer per week    Comment: Mixed drinks   No Known Allergies  Prior to Admission medications   Medication Sig Start Date End Date Taking? Authorizing Provider  albuterol (PROVENTIL HFA;VENTOLIN HFA) 108 (90 Base) MCG/ACT inhaler Inhale 2 puffs into the lungs every 6 (six) hours as needed for wheezing or shortness of breath. 12/26/18  Yes Guse, Jacquelynn Cree, FNP  allopurinol (ZYLOPRIM) 100 MG tablet Take 1 tablet (100 mg total) by mouth daily. 08/17/19  Yes Crecencio Mc, MD  amiodarone (PACERONE) 200 MG tablet Take 1 tablet (200 mg total) by mouth daily. 09/13/19  Yes Vaughan Basta, MD  ANORO ELLIPTA 62.5-25 MCG/INH AEPB USE 1 INHALATION DAILY Patient taking differently: Inhale 1 puff into the lungs daily.  06/12/19  Yes Wilhelmina Mcardle, MD  apixaban (ELIQUIS) 5 MG TABS tablet Take 5 mg by mouth 2 (two) times daily.  06/12/19  Yes [provider]  colchicine 0.6 MG tablet Take 0.6 mg by mouth as needed.    Yes [provider]  Cyanocobalamin 1000 MCG SUBL Place 1 tablet (1,000 mcg total) under the tongue every morning. 06/15/17  Yes Crecencio Mc, MD  escitalopram (LEXAPRO) 10  MG tablet Take 1 tablet (10 mg total) by mouth daily. 08/17/19  Yes Crecencio Mc, MD  folic acid (FOLVITE) 1 MG tablet Take 1 tablet (1 mg total) by mouth daily. 11/28/19  Yes Lloyd Huger, MD  isosorbide mononitrate (IMDUR) 30 MG 24 hr tablet Take 30 mg by mouth daily.   Yes [provider]  magnesium oxide (MAG-OX) 400 MG tablet Take 400 mg by mouth 2 (two) times daily.   Yes [provider]  metoprolol succinate (TOPROL-XL) 50 MG 24 hr tablet Take 1 tablet (50 mg total) by mouth daily. Take with or immediately following a meal. 09/14/19  Yes Vaughan Basta, MD  montelukast (SINGULAIR) 10 MG tablet Take 1 tablet (10 mg total) by mouth at bedtime. 08/17/19  Yes Crecencio Mc, MD  pantoprazole (PROTONIX) 40 MG tablet Take 1 tablet (40 mg total) by mouth daily. 05/10/19  Yes Crecencio Mc, MD  pravastatin (PRAVACHOL) 80 MG tablet Take 80 mg by mouth daily.   Yes [provider]  torsemide (DEMADEX) 20 MG  tablet Take 2 tablets (40 mg total) in the morning and 1 tablet (20 mg total) in the afternoon daily. Patient taking differently: Take 2 tablets (40 mg total) twice daily. 12/17/19  Yes Minna Merritts, MD     Review of Systems  Constitutional: Positive for fatigue (with minimal exertion). Negative for appetite change and fever.  HENT: Positive for rhinorrhea and tinnitus. Negative for congestion, ear pain, postnasal drip, sinus pressure and sore throat.   Eyes: Negative.   Respiratory: Positive for shortness of breath (easily). Negative for cough and chest tightness.   Cardiovascular: Positive for leg swelling (around ankles). Negative for chest pain and palpitations.  Gastrointestinal: Negative for abdominal distention and abdominal pain.  Endocrine: Negative.   Genitourinary: Negative.   Musculoskeletal: Negative for back pain and neck pain.  Skin: Negative.   Allergic/Immunologic: Negative.   Neurological: Negative for dizziness and  light-headedness.  Hematological: Negative for adenopathy. Bruises/bleeds easily.  Psychiatric/Behavioral: Negative for dysphoric mood, sleep disturbance (wearing CPAP at night. sleeping on 1 pillow) and suicidal ideas. The patient is not nervous/anxious.    Vitals:   12/27/19 1126  BP: 106/69  Pulse: 82  Resp: (!) 22  SpO2: 97%  Weight: 236 lb 6.4 oz (107.2 kg)  Height: 6\' 2"  (1.88 m)   Wt Readings from Last 3 Encounters:  12/27/19 236 lb 6.4 oz (107.2 kg)  12/13/19 222 lb 9.6 oz (101 kg)  11/28/19 221 lb (100.2 kg)   Lab Results  Component Value Date   CREATININE 2.28 (H) 12/03/2019   CREATININE 2.66 (H) 11/14/2019   CREATININE 1.70 (H) 10/27/2019     Physical Exam  Constitutional: He is oriented to person, place, and time. He appears well-developed and well-nourished.  HENT:  Head: Normocephalic and atraumatic.  Eyes: Pupils are equal, round, and reactive to light. Conjunctivae are normal.  Neck: No JVD present.  Cardiovascular: Normal rate and regular rhythm.  Pulmonary/Chest: Effort normal. He has no wheezes. He has no rales.  Abdominal: Soft. He exhibits no distension. There is no abdominal tenderness.  Musculoskeletal:        General: Edema (2+ pitting bilateral lower legs) present. No tenderness.     Cervical back: Normal range of motion and neck supple.  Neurological: He is alert and oriented to person, place, and time.  Skin: Skin is warm and dry.  Psychiatric: He has a normal mood and affect. His behavior is normal. Thought content normal.  Nursing note and vitals reviewed.  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 Reminded to call for an overnight weight gain of >2 pounds or a weekly weight gain of >5 pounds - weight up 6 pounds from last visit 2 months ago - not  adding salt and trying to follow a 2000mg  sodium diet - saw cardiologoly Rockey Situ) 11/26/2019 - BNP from 09/30/2019 was 1277.0 - saw pulmonologist Alva Garnet) 02/02/18.  - participating in paramedicine program - received his flu vaccine for this season - GFR too low for farxiga - sent secure chat to Dr. Candiss Norse (nephrology) in regards to change in diuretic usage; he suggests metolazone 2.5mg  twice weekly unless BP is < 003'B systolic; will also add potassium 56meq twice weekly with metolazone. Patient was advised - patient sees Dr. Rockey Situ on 01/01/20 so will ask his office to check  BMP   2: Hypotension- - BP on the low side this morning - saw PCP Derrel Nip) 11/12/2019 - BMP from 12/03/2019 reviewed and shows sodium 139, potassium 4.2, creatinine 2.28, and GFR 26  3: Chronic kidney disease stage 4- - saw nephrology Candiss Norse) 12/10/19  4: Lymphedema- - stage 2 - unable to exercise due to symptoms - has worn compression socks in the past but they hurt his legs - does elevate his legs during the day but edema persists - will send referral for lymphapress compression boots  Medication bottles were reviewed.   Return in 2 months or sooner for any questions/problems before then.

## 2019-12-27 ENCOUNTER — Encounter: Payer: Self-pay | Admitting: Family

## 2019-12-27 ENCOUNTER — Ambulatory Visit: Payer: Medicare Other | Attending: Family | Admitting: Family

## 2019-12-27 VITALS — BP 106/69 | HR 82 | Resp 22 | Ht 74.0 in | Wt 236.4 lb

## 2019-12-27 DIAGNOSIS — D696 Thrombocytopenia, unspecified: Secondary | ICD-10-CM | POA: Insufficient documentation

## 2019-12-27 DIAGNOSIS — G4733 Obstructive sleep apnea (adult) (pediatric): Secondary | ICD-10-CM | POA: Diagnosis not present

## 2019-12-27 DIAGNOSIS — J449 Chronic obstructive pulmonary disease, unspecified: Secondary | ICD-10-CM | POA: Insufficient documentation

## 2019-12-27 DIAGNOSIS — N184 Chronic kidney disease, stage 4 (severe): Secondary | ICD-10-CM | POA: Diagnosis not present

## 2019-12-27 DIAGNOSIS — I252 Old myocardial infarction: Secondary | ICD-10-CM | POA: Diagnosis not present

## 2019-12-27 DIAGNOSIS — E785 Hyperlipidemia, unspecified: Secondary | ICD-10-CM | POA: Insufficient documentation

## 2019-12-27 DIAGNOSIS — R0602 Shortness of breath: Secondary | ICD-10-CM | POA: Diagnosis not present

## 2019-12-27 DIAGNOSIS — I5023 Acute on chronic systolic (congestive) heart failure: Secondary | ICD-10-CM | POA: Insufficient documentation

## 2019-12-27 DIAGNOSIS — I890 Lymphedema, not elsewhere classified: Secondary | ICD-10-CM | POA: Diagnosis not present

## 2019-12-27 DIAGNOSIS — I2510 Atherosclerotic heart disease of native coronary artery without angina pectoris: Secondary | ICD-10-CM | POA: Insufficient documentation

## 2019-12-27 DIAGNOSIS — Z7901 Long term (current) use of anticoagulants: Secondary | ICD-10-CM | POA: Diagnosis not present

## 2019-12-27 DIAGNOSIS — Z8249 Family history of ischemic heart disease and other diseases of the circulatory system: Secondary | ICD-10-CM | POA: Diagnosis not present

## 2019-12-27 DIAGNOSIS — I950 Idiopathic hypotension: Secondary | ICD-10-CM

## 2019-12-27 DIAGNOSIS — Z79899 Other long term (current) drug therapy: Secondary | ICD-10-CM | POA: Insufficient documentation

## 2019-12-27 DIAGNOSIS — I959 Hypotension, unspecified: Secondary | ICD-10-CM | POA: Insufficient documentation

## 2019-12-27 DIAGNOSIS — K219 Gastro-esophageal reflux disease without esophagitis: Secondary | ICD-10-CM | POA: Diagnosis not present

## 2019-12-27 DIAGNOSIS — I255 Ischemic cardiomyopathy: Secondary | ICD-10-CM | POA: Diagnosis not present

## 2019-12-27 DIAGNOSIS — Z951 Presence of aortocoronary bypass graft: Secondary | ICD-10-CM | POA: Diagnosis not present

## 2019-12-27 DIAGNOSIS — D649 Anemia, unspecified: Secondary | ICD-10-CM | POA: Diagnosis not present

## 2019-12-27 DIAGNOSIS — I4891 Unspecified atrial fibrillation: Secondary | ICD-10-CM | POA: Diagnosis not present

## 2019-12-27 DIAGNOSIS — I130 Hypertensive heart and chronic kidney disease with heart failure and stage 1 through stage 4 chronic kidney disease, or unspecified chronic kidney disease: Secondary | ICD-10-CM | POA: Insufficient documentation

## 2019-12-27 DIAGNOSIS — Z950 Presence of cardiac pacemaker: Secondary | ICD-10-CM | POA: Diagnosis not present

## 2019-12-27 MED ORDER — POTASSIUM CHLORIDE CRYS ER 20 MEQ PO TBCR: 20.0000 meq | EXTENDED_RELEASE_TABLET | ORAL | 5 refills | Status: DC

## 2019-12-27 MED ORDER — METOLAZONE 2.5 MG PO TABS: 2.5000 mg | ORAL_TABLET | ORAL | 5 refills | Status: DC

## 2019-12-27 NOTE — Patient Instructions (Signed)
Continue weighing daily and call for an overnight weight gain of > 2 pounds or a weekly weight gain of >5 pounds. 

## 2019-12-28 ENCOUNTER — Telehealth: Payer: Self-pay | Admitting: Family

## 2019-12-28 NOTE — Telephone Encounter (Signed)
Spoke with patient to follow-up in regards to [employer]. He says that his BP this morning was 108/67. He says that his home weight yesterday was 229.8 lbs and this morning it is 238.8 lbs although no change in his symptoms.   Advised patient to take the metolazone/potassium this morning. He is to check his BP in 2-3 hours and let me know what it is as he may need to still take his morning torsemide just hours later.  Patient verbalized understanding of above.

## 2019-12-28 NOTE — Telephone Encounter (Signed)
Patient called back at 12:30pm to say that his BP is 102/61 and that he doesn't feel dizzy or off balance. Advised him to take his 40mg  torsemide just one time today.   He will take the metolazone 2.5mg  on Fridays and Tuesdays and hold morning dose of torsemide on those days until he's evaluated by cardiology on Tuesday 01/01/20. The other days of the week, he's to continue his torsemide 40mg  twice daily.   Patient verbalized understanding.

## 2019-12-30 NOTE — Progress Notes (Signed)
Cardiology Office Note  Date:  01/01/2020   ID:  Rick Gilmore, DOB 24-Apr-1941, MRN [id number]  PCP:  Crecencio Mc, MD   Chief Complaint  Patient presents with  . Other    1 month follow up. Patient c.o some SOB when walking and swelling in ankles. Meds reviewed verbally with patient.     HPI:  Mr. Render Marley is a 79 year old gentleman with past medical history of Iron def anemia CRI Chronic systolic CHF, ejection fraction 25 to 30% CAD, prior MI VT Portal HTN  Pacer/ICD Atrial fibrillation OSA ICD infection Who presents for follow-up of his chronic systolic CHF  Reports he has been monitoring weight daily Last month weight up to 230 pounds Reports he has been taking torsemide 40 twice daily Has had an appointment with nephrology Was seen by CHF clinic, started on metolazone twice a week He has taken 1 dose but held the torsemide when he took the metolazone Weight down to 225 pounds, up up 4 pounds from his last clinic visit -Denies excessive fluid intake, reports compliance with the torsemide  Daughter feels that he is very short of breath today, " having a bad day" .  Patient she reports that he feels well despite the breathing  He has not set up a visit with EP yet  EKG personally reviewed by myself on todays visit Shows paced rhythm rate 72 bpm  Previously seen by [facility] cardiology In the hospital 09/2019, acute on chronic systolic CHF  Echocardiogram showing continued moderate LV systolic dysfunction with ejection fraction of 25-30% and moderate to severe mitral regurgitation Large anterior, lateral and posterolateral myocardial infarction. Left atrial size was severely dilated. Moderately elevated pulmonary artery systolic pressure.  ICD battery change out 10/02/2019  developed infection in the pocket This required lead extraction for bacteremia October 2020  Prior to hospitalization was taking torsemide 60 in the morning 40 in the evening After  hospital discharge was put on torsemide 20 daily Dose was increased up to 40/20 mg on prior clinic visit   PMH:   has a past medical history of AICD (automatic cardioverter/defibrillator) present, Anemia, Arrhythmia, Atrial fibrillation ([place]), CAD (coronary artery disease), Cancer ([place]) (2001), CHF (congestive heart failure) ([place]), COPD (chronic obstructive pulmonary disease) ([place]), Erectile dysfunction, GERD (gastroesophageal reflux disease), Hyperlipidemia, Hypertension, Ischemic cardiomyopathy, MI (myocardial infarction) ([place]) (1988), Personal history of prostate cancer (2001), Prostate cancer ([place]), Sleep apnea, obstructive, Thrombocytopenia ([place]), Tubular adenoma, and Vitamin B 12 deficiency.  PSH:    Past Surgical History:  Procedure Laterality Date  . CARDIAC CATHETERIZATION    . COLONOSCOPY WITH PROPOFOL [name] 05/30/2015   Procedure: COLONOSCOPY WITH PROPOFOL;  Surgeon: Manya Silvas, MD;  Location: [facility] ENDOSCOPY;  Service: Endoscopy;  Laterality: [name];  . COLONOSCOPY WITH PROPOFOL [name] 07/16/2019   Procedure: COLONOSCOPY WITH PROPOFOL;  Surgeon: Toledo, Benay Pike, MD;  Location: [facility] ENDOSCOPY;  Service: Gastroenterology;  Laterality: [name];  . CORONARY ARTERY BYPASS GRAFT  1988  . ESOPHAGOGASTRODUODENOSCOPY [name] 05/30/2015   Procedure: ESOPHAGOGASTRODUODENOSCOPY (EGD);  Surgeon: Manya Silvas, MD;  Location: [facility] ENDOSCOPY;  Service: Endoscopy;  Laterality: [name];  . ESOPHAGOGASTRODUODENOSCOPY (EGD) WITH PROPOFOL [name] 07/16/2019   Procedure: ESOPHAGOGASTRODUODENOSCOPY (EGD) WITH PROPOFOL;  Surgeon: Toledo, Benay Pike, MD;  Location: [facility] ENDOSCOPY;  Service: Gastroenterology;  Laterality: [name];  . IMPLANTABLE CARDIOVERTER DEFIBRILLATOR (ICD) GENERATOR CHANGE Left 10/02/2019   Procedure: ICD GENERATOR CHANGE;  Surgeon: Isaias Cowman, MD;  Location: [facility] ORS;  Service: Cardiovascular;  Laterality: Left;  . IMPLANTABLE CARDIOVERTER  DEFIBRILLATOR GENERATOR CHANGE  April 2014   Bi V RRR  .  INSERT / REPLACE / REMOVE PACEMAKER  2014  . VASECTOMY      Current Outpatient Medications  Medication Sig Dispense Refill  . albuterol (PROVENTIL HFA;VENTOLIN HFA) 108 (90 Base) MCG/ACT inhaler Inhale 2 puffs into the lungs every 6 (six) hours as needed for wheezing or shortness of breath. 1 Inhaler 1  . allopurinol (ZYLOPRIM) 100 MG tablet Take 1 tablet (100 mg total) by mouth daily. 90 tablet 3  . amiodarone (PACERONE) 400 MG tablet Take 200 mg by mouth daily.    Jearl Klinefelter ELLIPTA 62.5-25 MCG/INH AEPB USE 1 INHALATION DAILY (Patient taking differently: Inhale 1 puff into the lungs daily. ) 60 each 0  . apixaban (ELIQUIS) 5 MG TABS tablet Take 5 mg by mouth 2 (two) times daily.     . colchicine 0.6 MG tablet Take 0.6 mg by mouth as needed.     . Cyanocobalamin 1000 MCG SUBL Place 1 tablet (1,000 mcg total) under the tongue every morning. 90 tablet 3  . escitalopram (LEXAPRO) 10 MG tablet Take 1 tablet (10 mg total) by mouth daily. 90 tablet 3  . folic acid (FOLVITE) 1 MG tablet Take 1 tablet (1 mg total) by mouth daily. 90 tablet 1  . isosorbide mononitrate (IMDUR) 30 MG 24 hr tablet Take 30 mg by mouth daily.  11  . magnesium oxide (MAG-OX) 400 MG tablet Take 400 mg by mouth 2 (two) times daily.    . metolazone (ZAROXOLYN) 2.5 MG tablet Take 1 tablet (2.5 mg total) by mouth 2 (two) times a week. Hold for blood pressure less than 120/50 8 tablet 5  . metoprolol succinate (TOPROL-XL) 50 MG 24 hr tablet Take 1 tablet (50 mg total) by mouth daily. Take with or immediately following a meal. 30 tablet 0  . montelukast (SINGULAIR) 10 MG tablet Take 1 tablet (10 mg total) by mouth at bedtime. 90 tablet 3  . pantoprazole (PROTONIX) 40 MG tablet Take 1 tablet (40 mg total) by mouth daily. 90 tablet 3  . potassium chloride SA (KLOR-CON) 20 MEQ tablet Take 1 tablet (20 mEq total) by mouth 2 (two) times a week. Take when you take the metolazone 8 tablet 5  . pravastatin (PRAVACHOL) 80 MG tablet Take 80 mg  by mouth daily.    Marland Kitchen torsemide (DEMADEX) 20 MG tablet Take 2 tablets (40 mg total) in the morning and 1 tablet (20 mg total) in the afternoon daily. (Patient taking differently: Take 2 tablets (40 mg total) twice daily.) 270 tablet 0   No current facility-administered medications for this visit.    Allergies:   Patient has no known allergies.   Social History:  The patient  reports that he has never smoked. He has never used smokeless tobacco. He reports current alcohol use of about 4.0 standard drinks of alcohol per week. He reports that he does not use drugs.   Family History:   family history includes Cancer in his father; Emphysema in his father; Heart disease in his maternal grandmother and paternal grandmother; Hypertension in his father and mother.    Review of Systems: Review of Systems  Constitutional: Negative.   HENT: Negative.   Respiratory: Positive for shortness of breath.   Cardiovascular: Positive for leg swelling.  Gastrointestinal: Negative.   Musculoskeletal: Negative.   Neurological: Negative.   Psychiatric/Behavioral: Negative.   All other systems reviewed and are negative.   PHYSICAL EXAM: VS:  BP (!) 100/52 (BP Location: Right Arm, Patient Position: Sitting, Cuff Size: Normal)   Pulse 72   Ht 6\' 2"  (1.88 m)   Wt 225 lb (102.1 kg)   BMI 28.89 kg/m  , BMI Body mass index is 28.89 kg/m. Constitutional:  oriented to person, place, and time. No distress.  HENT:  Head: Grossly normal Eyes:  no discharge. No scleral icterus.  Neck: No JVD, no carotid bruits  Cardiovascular: Regular rate and rhythm, no murmurs appreciated 1+ pitting edema above the sock line bilaterally Pulmonary/Chest: Dullness at the bases bilaterally, no wheezes or rails Abdominal: Soft.  no distension.  no tenderness.  Musculoskeletal: Normal range of motion Neurological:  normal muscle tone. Coordination normal. No atrophy Skin: Skin warm and dry Psychiatric: normal affect,  pleasant   Recent Labs: 08/17/2019: Pro B Natriuretic peptide (BNP) 1,124.0 09/12/2019: Magnesium 2.3 09/30/2019: B Natriuretic Peptide 1,277.0; TSH 2.104 11/14/2019: ALT 8 12/03/2019: BUN 35; Creatinine, Ser 2.28; Potassium 4.2; Sodium 139 12/26/2019: Hemoglobin 8.8; Platelets 100    Lipid Panel Lab Results  Component Value Date   CHOL 147 05/28/2019   HDL 38.50 (L) 05/28/2019   LDLCALC 89 05/28/2019   TRIG 102.0 05/28/2019      Wt Readings from Last 3 Encounters:  01/01/20 225 lb (102.1 kg)  12/27/19 236 lb 6.4 oz (107.2 kg)  12/13/19 222 lb 9.6 oz (101 kg)      ASSESSMENT AND PLAN:  Problem List Items Addressed This Visit      Cardiology Problems   Chronic systolic heart failure (HCC)   Relevant Medications   amiodarone (PACERONE) 400 MG tablet   Atrial fibrillation (HCC)   Relevant Medications   amiodarone (PACERONE) 400 MG tablet   Other Relevant Orders   EKG 12-Lead   V tach (HCC)   Relevant Medications   amiodarone (PACERONE) 400 MG tablet   Hypotension - Primary   Relevant Medications   amiodarone (PACERONE) 400 MG tablet   Ischemic cardiomyopathy   Relevant Medications   amiodarone (PACERONE) 400 MG tablet    Other Visit Diagnoses    CKD (chronic kidney disease) stage 4, GFR 15-29 ml/min (HCC)       ICD (implantable cardioverter-defibrillator) in place         Acute on chronic systolic CHF Weight is up, pleural effusions with lower extremity pitting edema Suspect goal weight 217 pounds or less -Recommend he continue torsemide 40 twice daily with metolazone 2.5 mg twice a week Would hold the metolazone for weight less than 217 pounds -BMP for weight 216 up to 217 pounds, order placed today  Vtach/ICD Ischemic cardiomyopathy Lead extraction for bacteremia October 2020 Ejection fraction 25 to 30%  Ischemic cardiomyopathy On isosorbide Low blood pressure limiting use of beta-blockers, ACE/ARB/ entresto 443 systolic on today's visit with need  for more aggressive diuresis  Disposition:   F/U  2 month  Long discussion with him concerning CHF management, moderating his fluid intake, titrating his diuretics  Total encounter time more than 45 minutes  Greater than 50% was spent in counseling and coordination of care with the patient   Signed, Esmond Plants, M.D., Ph.D. [place], [place]

## 2020-01-01 ENCOUNTER — Other Ambulatory Visit: Payer: Self-pay

## 2020-01-01 ENCOUNTER — Encounter: Payer: Self-pay | Admitting: Cardiovascular Disease

## 2020-01-01 ENCOUNTER — Ambulatory Visit (INDEPENDENT_AMBULATORY_CARE_PROVIDER_SITE_OTHER): Payer: Medicare Other | Admitting: Cardiovascular Disease

## 2020-01-01 VITALS — BP 100/52 | HR 72 | Ht 74.0 in | Wt 225.0 lb

## 2020-01-01 DIAGNOSIS — Z95810 Presence of automatic (implantable) cardiac defibrillator: Secondary | ICD-10-CM

## 2020-01-01 DIAGNOSIS — I472 Ventricular tachycardia, unspecified: Secondary | ICD-10-CM

## 2020-01-01 DIAGNOSIS — N184 Chronic kidney disease, stage 4 (severe): Secondary | ICD-10-CM | POA: Diagnosis not present

## 2020-01-01 DIAGNOSIS — I482 Chronic atrial fibrillation, unspecified: Secondary | ICD-10-CM | POA: Diagnosis not present

## 2020-01-01 DIAGNOSIS — I5022 Chronic systolic (congestive) heart failure: Secondary | ICD-10-CM | POA: Diagnosis not present

## 2020-01-01 DIAGNOSIS — I255 Ischemic cardiomyopathy: Secondary | ICD-10-CM

## 2020-01-01 DIAGNOSIS — I950 Idiopathic hypotension: Secondary | ICD-10-CM

## 2020-01-01 MED ORDER — TORSEMIDE 20 MG PO TABS: 20.0000 mg | ORAL_TABLET | Freq: Two times a day (BID) | ORAL | 3 refills | Status: DC

## 2020-01-01 MED ORDER — TORSEMIDE 20 MG PO TABS: 40.0000 mg | ORAL_TABLET | Freq: Two times a day (BID) | ORAL | 3 refills | Status: AC

## 2020-01-01 NOTE — Patient Instructions (Addendum)
Appt with Dr. Caryl Comes, EP   Medication Instructions:  Continue the torsemide 40 twice a day  For weight > 217,  continue to take metolazone twice a week with potassium **Take metolazone 30 min before the morning torsemide 40 mg with potassium)**  For weight 216 or less, hold the metolazone (sty on torsemide 40 twice a day)  When weight 216 to 217,  Check labs   (BMP) in hospital lobby  If you need a refill on your cardiac medications before your next appointment, please call your pharmacy.    Lab work: No new labs needed   If you have labs (blood work) drawn today and your tests are completely normal, you will receive your results only by: Marland Kitchen MyChart Message (if you have MyChart) OR . A paper copy in the mail If you have any lab test that is abnormal or we need to change your treatment, we will call you to review the results.   Testing/Procedures: No new testing needed   Follow-Up: At [facility], you and your health needs are our priority.  As part of our continuing mission to provide you with exceptional heart care, we have created designated Provider Care Teams.  These Care Teams include your primary Cardiologist (physician) and Advanced Practice Providers (APPs -  Physician Assistants and Nurse Practitioners) who all work together to provide you with the care you need, when you need it.  . You will need a follow up appointment in 2 months . Please schedule appointment with Dr. Caryl Comes for EP. Patient does have a device.    . Providers on your designated Care Team:   . Murray Hodgkins, NP . Christell Faith, PA-C . Marrianne Mood, PA-C  Any Other Special Instructions Will Be Listed Below (If Applicable).  For educational health videos Log in to : [email or web address] Or : [email or web address], password : [email or web address]

## 2020-01-03 ENCOUNTER — Encounter: Payer: Self-pay | Admitting: Family

## 2020-01-07 LAB — INTELLIGEN MYELOID: IntelliGEN Myeloid PDF: 0

## 2020-01-22 ENCOUNTER — Other Ambulatory Visit: Payer: Self-pay

## 2020-01-22 MED ORDER — ESCITALOPRAM OXALATE 10 MG PO TABS: 10.0000 mg | ORAL_TABLET | Freq: Every day | ORAL | 3 refills | Status: AC

## 2020-01-22 MED ORDER — METOPROLOL SUCCINATE ER 50 MG PO TB24: 50.0000 mg | ORAL_TABLET | Freq: Every day | ORAL | 0 refills | Status: DC

## 2020-01-22 MED ORDER — ANORO ELLIPTA 62.5-25 MCG/INH IN AEPB: INHALATION_SPRAY | RESPIRATORY_TRACT | 0 refills | Status: AC

## 2020-01-22 MED ORDER — MAGNESIUM OXIDE 400 MG PO TABS: 400.0000 mg | ORAL_TABLET | Freq: Two times a day (BID) | ORAL | 1 refills | Status: AC

## 2020-01-23 ENCOUNTER — Other Ambulatory Visit: Payer: Self-pay

## 2020-01-23 ENCOUNTER — Emergency Department
Admission: EM | Admit: 2020-01-23 | Discharge: 2020-01-23 | Disposition: A | Discharge status: Other Acute Inpatient Hospital | Payer: Medicare Other | Attending: Student | Admitting: Student

## 2020-01-23 ENCOUNTER — Emergency Department: Payer: Medicare Other

## 2020-01-23 ENCOUNTER — Inpatient Hospital Stay: Payer: Medicare Other

## 2020-01-23 ENCOUNTER — Inpatient Hospital Stay (HOSPITAL_COMMUNITY)
Admission: EM | Admit: 2020-01-23 | Discharge: 2020-02-01 | DRG: 308 | Disposition: A | Discharge status: Hospice | Payer: Medicare Other | Source: Other Acute Inpatient Hospital | Attending: Internal Medicine | Admitting: Internal Medicine

## 2020-01-23 ENCOUNTER — Inpatient Hospital Stay: Payer: Medicare Other | Attending: Oncology

## 2020-01-23 VITALS — BP 96/58 | HR 70

## 2020-01-23 DIAGNOSIS — F329 Major depressive disorder, single episode, unspecified: Secondary | ICD-10-CM | POA: Diagnosis not present

## 2020-01-23 DIAGNOSIS — E611 Iron deficiency: Secondary | ICD-10-CM | POA: Diagnosis present

## 2020-01-23 DIAGNOSIS — I252 Old myocardial infarction: Secondary | ICD-10-CM

## 2020-01-23 DIAGNOSIS — D6489 Other specified anemias: Secondary | ICD-10-CM | POA: Diagnosis present

## 2020-01-23 DIAGNOSIS — Z6828 Body mass index (BMI) 28.0-28.9, adult: Secondary | ICD-10-CM

## 2020-01-23 DIAGNOSIS — I5022 Chronic systolic (congestive) heart failure: Secondary | ICD-10-CM | POA: Insufficient documentation

## 2020-01-23 DIAGNOSIS — Z951 Presence of aortocoronary bypass graft: Secondary | ICD-10-CM

## 2020-01-23 DIAGNOSIS — R918 Other nonspecific abnormal finding of lung field: Secondary | ICD-10-CM | POA: Diagnosis not present

## 2020-01-23 DIAGNOSIS — S0990XA Unspecified injury of head, initial encounter: Secondary | ICD-10-CM | POA: Diagnosis not present

## 2020-01-23 DIAGNOSIS — I2510 Atherosclerotic heart disease of native coronary artery without angina pectoris: Secondary | ICD-10-CM | POA: Diagnosis present

## 2020-01-23 DIAGNOSIS — I472 Ventricular tachycardia, unspecified: Secondary | ICD-10-CM

## 2020-01-23 DIAGNOSIS — N170 Acute kidney failure with tubular necrosis: Secondary | ICD-10-CM | POA: Diagnosis present

## 2020-01-23 DIAGNOSIS — M109 Gout, unspecified: Secondary | ICD-10-CM | POA: Diagnosis not present

## 2020-01-23 DIAGNOSIS — N183 Chronic kidney disease, stage 3 unspecified: Secondary | ICD-10-CM | POA: Diagnosis not present

## 2020-01-23 DIAGNOSIS — Z9889 Other specified postprocedural states: Secondary | ICD-10-CM

## 2020-01-23 DIAGNOSIS — I4821 Permanent atrial fibrillation: Secondary | ICD-10-CM | POA: Diagnosis present

## 2020-01-23 DIAGNOSIS — K766 Portal hypertension: Secondary | ICD-10-CM | POA: Diagnosis present

## 2020-01-23 DIAGNOSIS — J449 Chronic obstructive pulmonary disease, unspecified: Secondary | ICD-10-CM | POA: Diagnosis not present

## 2020-01-23 DIAGNOSIS — R570 Cardiogenic shock: Secondary | ICD-10-CM | POA: Diagnosis not present

## 2020-01-23 DIAGNOSIS — Z9989 Dependence on other enabling machines and devices: Secondary | ICD-10-CM | POA: Diagnosis not present

## 2020-01-23 DIAGNOSIS — Z20822 Contact with and (suspected) exposure to covid-19: Secondary | ICD-10-CM | POA: Diagnosis not present

## 2020-01-23 DIAGNOSIS — J811 Chronic pulmonary edema: Secondary | ICD-10-CM | POA: Diagnosis not present

## 2020-01-23 DIAGNOSIS — I517 Cardiomegaly: Secondary | ICD-10-CM | POA: Diagnosis not present

## 2020-01-23 DIAGNOSIS — I272 Pulmonary hypertension, unspecified: Secondary | ICD-10-CM | POA: Diagnosis not present

## 2020-01-23 DIAGNOSIS — E538 Deficiency of other specified B group vitamins: Secondary | ICD-10-CM | POA: Diagnosis not present

## 2020-01-23 DIAGNOSIS — E785 Hyperlipidemia, unspecified: Secondary | ICD-10-CM | POA: Diagnosis present

## 2020-01-23 DIAGNOSIS — Z95810 Presence of automatic (implantable) cardiac defibrillator: Secondary | ICD-10-CM | POA: Diagnosis not present

## 2020-01-23 DIAGNOSIS — G4733 Obstructive sleep apnea (adult) (pediatric): Secondary | ICD-10-CM | POA: Diagnosis present

## 2020-01-23 DIAGNOSIS — K7460 Unspecified cirrhosis of liver: Secondary | ICD-10-CM | POA: Diagnosis present

## 2020-01-23 DIAGNOSIS — I361 Nonrheumatic tricuspid (valve) insufficiency: Secondary | ICD-10-CM | POA: Diagnosis not present

## 2020-01-23 DIAGNOSIS — R0602 Shortness of breath: Secondary | ICD-10-CM | POA: Diagnosis not present

## 2020-01-23 DIAGNOSIS — I130 Hypertensive heart and chronic kidney disease with heart failure and stage 1 through stage 4 chronic kidney disease, or unspecified chronic kidney disease: Secondary | ICD-10-CM | POA: Diagnosis present

## 2020-01-23 DIAGNOSIS — R55 Syncope and collapse: Secondary | ICD-10-CM | POA: Diagnosis not present

## 2020-01-23 DIAGNOSIS — F419 Anxiety disorder, unspecified: Secondary | ICD-10-CM | POA: Diagnosis present

## 2020-01-23 DIAGNOSIS — Z7901 Long term (current) use of anticoagulants: Secondary | ICD-10-CM

## 2020-01-23 DIAGNOSIS — R64 Cachexia: Secondary | ICD-10-CM | POA: Diagnosis present

## 2020-01-23 DIAGNOSIS — J948 Other specified pleural conditions: Secondary | ICD-10-CM | POA: Diagnosis not present

## 2020-01-23 DIAGNOSIS — I5040 Unspecified combined systolic (congestive) and diastolic (congestive) heart failure: Secondary | ICD-10-CM

## 2020-01-23 DIAGNOSIS — D649 Anemia, unspecified: Secondary | ICD-10-CM

## 2020-01-23 DIAGNOSIS — J95811 Postprocedural pneumothorax: Secondary | ICD-10-CM | POA: Diagnosis not present

## 2020-01-23 DIAGNOSIS — R4020 Unspecified coma: Secondary | ICD-10-CM | POA: Diagnosis not present

## 2020-01-23 DIAGNOSIS — Z515 Encounter for palliative care: Secondary | ICD-10-CM | POA: Diagnosis not present

## 2020-01-23 DIAGNOSIS — Z452 Encounter for adjustment and management of vascular access device: Secondary | ICD-10-CM

## 2020-01-23 DIAGNOSIS — N184 Chronic kidney disease, stage 4 (severe): Secondary | ICD-10-CM | POA: Diagnosis present

## 2020-01-23 DIAGNOSIS — J939 Pneumothorax, unspecified: Secondary | ICD-10-CM | POA: Diagnosis not present

## 2020-01-23 DIAGNOSIS — I110 Hypertensive heart disease with heart failure: Secondary | ICD-10-CM | POA: Diagnosis not present

## 2020-01-23 DIAGNOSIS — I255 Ischemic cardiomyopathy: Secondary | ICD-10-CM | POA: Diagnosis present

## 2020-01-23 DIAGNOSIS — I959 Hypotension, unspecified: Secondary | ICD-10-CM | POA: Diagnosis present

## 2020-01-23 DIAGNOSIS — J918 Pleural effusion in other conditions classified elsewhere: Secondary | ICD-10-CM | POA: Diagnosis present

## 2020-01-23 DIAGNOSIS — E440 Moderate protein-calorie malnutrition: Secondary | ICD-10-CM | POA: Diagnosis not present

## 2020-01-23 DIAGNOSIS — Z923 Personal history of irradiation: Secondary | ICD-10-CM

## 2020-01-23 DIAGNOSIS — N189 Chronic kidney disease, unspecified: Secondary | ICD-10-CM | POA: Insufficient documentation

## 2020-01-23 DIAGNOSIS — Z79899 Other long term (current) drug therapy: Secondary | ICD-10-CM | POA: Diagnosis not present

## 2020-01-23 DIAGNOSIS — D631 Anemia in chronic kidney disease: Secondary | ICD-10-CM | POA: Insufficient documentation

## 2020-01-23 DIAGNOSIS — I4891 Unspecified atrial fibrillation: Secondary | ICD-10-CM | POA: Diagnosis not present

## 2020-01-23 DIAGNOSIS — I499 Cardiac arrhythmia, unspecified: Secondary | ICD-10-CM | POA: Diagnosis not present

## 2020-01-23 DIAGNOSIS — R000 Tachycardia, unspecified: Secondary | ICD-10-CM | POA: Diagnosis not present

## 2020-01-23 DIAGNOSIS — D6959 Other secondary thrombocytopenia: Secondary | ICD-10-CM | POA: Diagnosis present

## 2020-01-23 DIAGNOSIS — K219 Gastro-esophageal reflux disease without esophagitis: Secondary | ICD-10-CM | POA: Diagnosis not present

## 2020-01-23 DIAGNOSIS — R0902 Hypoxemia: Secondary | ICD-10-CM | POA: Diagnosis not present

## 2020-01-23 DIAGNOSIS — I5043 Acute on chronic combined systolic (congestive) and diastolic (congestive) heart failure: Secondary | ICD-10-CM | POA: Diagnosis not present

## 2020-01-23 DIAGNOSIS — Z8546 Personal history of malignant neoplasm of prostate: Secondary | ICD-10-CM

## 2020-01-23 DIAGNOSIS — Z03818 Encounter for observation for suspected exposure to other biological agents ruled out: Secondary | ICD-10-CM | POA: Diagnosis not present

## 2020-01-23 DIAGNOSIS — J90 Pleural effusion, not elsewhere classified: Secondary | ICD-10-CM | POA: Diagnosis not present

## 2020-01-23 DIAGNOSIS — Z950 Presence of cardiac pacemaker: Secondary | ICD-10-CM | POA: Diagnosis not present

## 2020-01-23 DIAGNOSIS — Z7189 Other specified counseling: Secondary | ICD-10-CM | POA: Diagnosis not present

## 2020-01-23 DIAGNOSIS — I081 Rheumatic disorders of both mitral and tricuspid valves: Secondary | ICD-10-CM | POA: Diagnosis present

## 2020-01-23 DIAGNOSIS — I5082 Biventricular heart failure: Secondary | ICD-10-CM | POA: Diagnosis present

## 2020-01-23 DIAGNOSIS — N529 Male erectile dysfunction, unspecified: Secondary | ICD-10-CM | POA: Diagnosis not present

## 2020-01-23 DIAGNOSIS — Z825 Family history of asthma and other chronic lower respiratory diseases: Secondary | ICD-10-CM

## 2020-01-23 DIAGNOSIS — N179 Acute kidney failure, unspecified: Secondary | ICD-10-CM | POA: Diagnosis not present

## 2020-01-23 DIAGNOSIS — Z8249 Family history of ischemic heart disease and other diseases of the circulatory system: Secondary | ICD-10-CM

## 2020-01-23 DIAGNOSIS — R0689 Other abnormalities of breathing: Secondary | ICD-10-CM | POA: Diagnosis not present

## 2020-01-23 DIAGNOSIS — Z66 Do not resuscitate: Secondary | ICD-10-CM | POA: Diagnosis present

## 2020-01-23 DIAGNOSIS — I25709 Atherosclerosis of coronary artery bypass graft(s), unspecified, with unspecified angina pectoris: Secondary | ICD-10-CM | POA: Diagnosis not present

## 2020-01-23 DIAGNOSIS — L813 Cafe au lait spots: Secondary | ICD-10-CM | POA: Diagnosis present

## 2020-01-23 DIAGNOSIS — R42 Dizziness and giddiness: Secondary | ICD-10-CM | POA: Diagnosis not present

## 2020-01-23 DIAGNOSIS — Z7401 Bed confinement status: Secondary | ICD-10-CM | POA: Diagnosis not present

## 2020-01-23 DIAGNOSIS — E876 Hypokalemia: Secondary | ICD-10-CM | POA: Diagnosis present

## 2020-01-23 DIAGNOSIS — Z8679 Personal history of other diseases of the circulatory system: Secondary | ICD-10-CM | POA: Diagnosis not present

## 2020-01-23 DIAGNOSIS — Z9689 Presence of other specified functional implants: Secondary | ICD-10-CM

## 2020-01-23 DIAGNOSIS — I340 Nonrheumatic mitral (valve) insufficiency: Secondary | ICD-10-CM | POA: Diagnosis not present

## 2020-01-23 DIAGNOSIS — Z4682 Encounter for fitting and adjustment of non-vascular catheter: Secondary | ICD-10-CM | POA: Diagnosis not present

## 2020-01-23 DIAGNOSIS — M2550 Pain in unspecified joint: Secondary | ICD-10-CM | POA: Diagnosis not present

## 2020-01-23 DIAGNOSIS — I428 Other cardiomyopathies: Secondary | ICD-10-CM | POA: Diagnosis not present

## 2020-01-23 LAB — COMPREHENSIVE METABOLIC PANEL
ALT: 9 U/L (ref 0–44)
AST: 27 U/L (ref 15–41)
Albumin: 3.3 g/dL — ABNORMAL LOW (ref 3.5–5.0)
Alkaline Phosphatase: 56 U/L (ref 38–126)
Anion gap: 14 (ref 5–15)
BUN: 49 mg/dL — ABNORMAL HIGH (ref 8–23)
CO2: 19 mmol/L — ABNORMAL LOW (ref 22–32)
Calcium: 7.5 mg/dL — ABNORMAL LOW (ref 8.9–10.3)
Chloride: 103 mmol/L (ref 98–111)
Creatinine, Ser: 2.75 mg/dL — ABNORMAL HIGH (ref 0.61–1.24)
GFR calc Af Amer: 24 mL/min — ABNORMAL LOW (ref >60)
GFR calc non Af Amer: 21 mL/min — ABNORMAL LOW (ref >60)
Glucose, Bld: 152 mg/dL — ABNORMAL HIGH (ref 70–99)
Potassium: 3.2 mmol/L — ABNORMAL LOW (ref 3.5–5.1)
Sodium: 136 mmol/L (ref 135–145)
Total Bilirubin: 1.5 mg/dL — ABNORMAL HIGH (ref 0.3–1.2)
Total Protein: 5.7 g/dL — ABNORMAL LOW (ref 6.5–8.1)

## 2020-01-23 LAB — CBC WITH DIFFERENTIAL/PLATELET
Abs Immature Granulocytes: 0.04 10*3/uL (ref 0.00–0.07)
Abs Immature Granulocytes: 0.06 10*3/uL (ref 0.00–0.07)
Basophils Absolute: 0 10*3/uL (ref 0.0–0.1)
Basophils Absolute: 0 10*3/uL (ref 0.0–0.1)
Basophils Relative: 0 %
Basophils Relative: 0 %
Eosinophils Absolute: 0 10*3/uL (ref 0.0–0.5)
Eosinophils Absolute: 0 10*3/uL (ref 0.0–0.5)
Eosinophils Relative: 0 %
Eosinophils Relative: 0 %
HCT: 29.1 % — ABNORMAL LOW (ref 39.0–52.0)
HCT: 29.6 % — ABNORMAL LOW (ref 39.0–52.0)
Hemoglobin: 8.8 g/dL — ABNORMAL LOW (ref 13.0–17.0)
Hemoglobin: 9.1 g/dL — ABNORMAL LOW (ref 13.0–17.0)
Immature Granulocytes: 1 %
Immature Granulocytes: 1 %
Lymphocytes Relative: 13 %
Lymphocytes Relative: 20 %
Lymphs Abs: 1 10*3/uL (ref 0.7–4.0)
Lymphs Abs: 1.9 10*3/uL (ref 0.7–4.0)
MCH: 28.9 pg (ref 26.0–34.0)
MCH: 29.4 pg (ref 26.0–34.0)
MCHC: 30.2 g/dL (ref 30.0–36.0)
MCHC: 30.7 g/dL (ref 30.0–36.0)
MCV: 95.7 fL (ref 80.0–100.0)
MCV: 95.5 fL (ref 80.0–100.0)
Monocytes Absolute: 0.6 10*3/uL (ref 0.1–1.0)
Monocytes Absolute: 0.5 10*3/uL (ref 0.1–1.0)
Monocytes Relative: 8 %
Monocytes Relative: 6 %
Neutro Abs: 5.8 10*3/uL (ref 1.7–7.7)
Neutro Abs: 6.9 10*3/uL (ref 1.7–7.7)
Neutrophils Relative %: 78 %
Neutrophils Relative %: 73 %
Platelets: 109 10*3/uL — ABNORMAL LOW (ref 150–400)
Platelets: 135 10*3/uL — ABNORMAL LOW (ref 150–400)
RBC: 3.04 MIL/uL — ABNORMAL LOW (ref 4.22–5.81)
RBC: 3.1 MIL/uL — ABNORMAL LOW (ref 4.22–5.81)
RDW: 17.1 % — ABNORMAL HIGH (ref 11.5–15.5)
RDW: 17.2 % — ABNORMAL HIGH (ref 11.5–15.5)
WBC: 7.5 10*3/uL (ref 4.0–10.5)
WBC: 9.4 10*3/uL (ref 4.0–10.5)
nRBC: 0 % (ref 0.0–0.2)
nRBC: 0 % (ref 0.0–0.2)

## 2020-01-23 LAB — IRON AND TIBC
Iron: 37 ug/dL — ABNORMAL LOW (ref 45–182)
Saturation Ratios: 10 % — ABNORMAL LOW (ref 17.9–39.5)
TIBC: 374 ug/dL (ref 250–450)
UIBC: 337 ug/dL

## 2020-01-23 LAB — PROTIME-INR
INR: 1.9 — ABNORMAL HIGH (ref 0.8–1.2)
Prothrombin Time: 21.9 seconds — ABNORMAL HIGH (ref 11.4–15.2)

## 2020-01-23 LAB — APTT: aPTT: 42 seconds — ABNORMAL HIGH (ref 24–36)

## 2020-01-23 LAB — TROPONIN I (HIGH SENSITIVITY)
Troponin I (High Sensitivity): 24 ng/L — ABNORMAL HIGH (ref <18)
Troponin I (High Sensitivity): 31 ng/L — ABNORMAL HIGH (ref <18)

## 2020-01-23 LAB — FERRITIN: Ferritin: 103 ng/mL (ref 24–336)

## 2020-01-23 LAB — GLUCOSE, CAPILLARY: Glucose-Capillary: 163 mg/dL — ABNORMAL HIGH (ref 70–99)

## 2020-01-23 LAB — MAGNESIUM: Magnesium: 1.9 mg/dL (ref 1.7–2.4)

## 2020-01-23 LAB — RESPIRATORY PANEL BY RT PCR (FLU A&B, COVID)
Influenza A by PCR: NEGATIVE
Influenza B by PCR: NEGATIVE
SARS Coronavirus 2 by RT PCR: NEGATIVE

## 2020-01-23 LAB — BRAIN NATRIURETIC PEPTIDE: B Natriuretic Peptide: 1070 pg/mL — ABNORMAL HIGH (ref 0.0–100.0)

## 2020-01-23 MED ORDER — MAGNESIUM SULFATE 50 % IJ SOLN: 1.0000 g | Freq: Once | INTRAMUSCULAR | Status: DC

## 2020-01-23 MED ORDER — AMIODARONE HCL IN DEXTROSE 360-4.14 MG/200ML-% IV SOLN
INTRAVENOUS | Status: AC
  Administered 2020-01-23: 20:00:00 60 mg/h via INTRAVENOUS
  Filled 2020-01-23: qty 200

## 2020-01-23 MED ORDER — AMIODARONE HCL IN DEXTROSE 360-4.14 MG/200ML-% IV SOLN: 30.0000 mg/h | INTRAVENOUS | Status: DC

## 2020-01-23 MED ORDER — ETOMIDATE 2 MG/ML IV SOLN
INTRAVENOUS | Status: AC | PRN
  Administered 2020-01-23 (×2): 10 mg via INTRAVENOUS

## 2020-01-23 MED ORDER — POTASSIUM CHLORIDE CRYS ER 20 MEQ PO TBCR
40.0000 meq | EXTENDED_RELEASE_TABLET | Freq: Once | ORAL | Status: AC
  Administered 2020-01-24: 01:00:00 40 meq via ORAL
  Filled 2020-01-23: qty 2

## 2020-01-23 MED ORDER — EPOETIN ALFA-EPBX 40000 UNIT/ML IJ SOLN
40000.0000 [IU] | Freq: Once | INTRAMUSCULAR | Status: AC
  Administered 2020-01-23: 40000 [IU] via SUBCUTANEOUS
  Filled 2020-01-23: qty 1

## 2020-01-23 MED ORDER — AMIODARONE HCL IN DEXTROSE 360-4.14 MG/200ML-% IV SOLN
60.0000 mg/h | INTRAVENOUS | Status: DC
  Administered 2020-01-23: 19:00:00 60 mg/h via INTRAVENOUS
  Administered 2020-01-23: 20:00:00 150 mg/h via INTRAVENOUS
  Filled 2020-01-23: qty 200

## 2020-01-23 MED ORDER — AMIODARONE HCL IN DEXTROSE 360-4.14 MG/200ML-% IV SOLN
60.0000 mg/h | INTRAVENOUS | Status: DC
  Administered 2020-01-24 – 2020-01-25 (×6): 60 mg/h via INTRAVENOUS
  Filled 2020-01-23 (×6): qty 200

## 2020-01-23 MED ORDER — ETOMIDATE 2 MG/ML IV SOLN
INTRAVENOUS | Status: AC
  Filled 2020-01-23: qty 10

## 2020-01-23 NOTE — Sedation Documentation (Signed)
Pt in continued vtach

## 2020-01-23 NOTE — Sedation Documentation (Signed)
Respiratory at bedside.

## 2020-01-23 NOTE — ED Notes (Signed)
Dr. Joan Mayans aware of BP 75/61, at bedside.  Continued runs of vtach Pt alert and oriented

## 2020-01-23 NOTE — Sedation Documentation (Addendum)
shck given 200J sync

## 2020-01-23 NOTE — Sedation Documentation (Signed)
Continued vtach on monitor

## 2020-01-23 NOTE — ED Notes (Signed)
Daughter at bedside, ok per Dr. Joan Mayans

## 2020-01-23 NOTE — Sedation Documentation (Signed)
150mg  amio bolus per dr.monks. given

## 2020-01-23 NOTE — ED Notes (Signed)
Monks at bedside

## 2020-01-23 NOTE — ED Notes (Signed)
Pt in continued Vtach. Awake and alert. Monks aware at bedside defib device interrogated by [employer]

## 2020-01-23 NOTE — ED Triage Notes (Signed)
Pt to ED via [facility] from home for vtach/syncope. EMS reports 80 systolic BP, HR 58K.  Pt has defib, reports it has not gone off.  Hx vtach in sept, reports he was shocked and given amio Received iron infusion today.  Pt pale upon arrival  20G left wrist. 22G left wrist EMS reports 150 amio given Pt alert and talkative at this time Denies CP, reports SHOB Denies pain from syncopal episode

## 2020-01-23 NOTE — Sedation Documentation (Addendum)
Shock given 200J sync

## 2020-01-23 NOTE — ED Notes (Signed)
Pending ICU bed at [facility]

## 2020-01-23 NOTE — Sedation Documentation (Signed)
Per Dr. Joan Mayans cardiovert, sync at [facility]

## 2020-01-23 NOTE — Progress Notes (Signed)
Ch paged @7 :05 p.m. ED 8. Patient was being cared for by medical staff. After a brief conversation with attending Dr. I went to find family member who had accompanied patient. I never located family member. The Doctor continued to update me on treatment plan to update family if contact was made. I prayed for patient and staff. Chaplain visit ended asked to be paged if needed again.

## 2020-01-23 NOTE — ED Notes (Signed)
[facility] here to transport to [address] [facility]

## 2020-01-23 NOTE — Sedation Documentation (Signed)
Pt stating "cant breathe". Respiratory providing breaths BVM.  SpO2 100%

## 2020-01-23 NOTE — Sedation Documentation (Signed)
Pt in continued vtach. Reported not feeling well, "feels hot". Monks at bedside

## 2020-01-23 NOTE — ED Notes (Signed)
Pt to ct with RN

## 2020-01-23 NOTE — ED Provider Notes (Addendum)
[facility] Emergency Department Provider Note  ____________________________________________   First MD Initiated Contact with Patient 01/23/20 1909     (approximate)  I have reviewed the triage vital signs and the nursing notes.  History  Chief Complaint vtach and Loss of Consciousness    HPI Rick Gilmore is a 79 y.o. male with a history of ischemic cardiomyopathy, EF 25 to 30%, CAD with prior MI, iron deficiency anemia (was just at the infusion clinic earlier today), pacer/ICD in place, VT, atrial fibrillation on anticoagulation, who presents emergency department for syncope.  Patient reports feeling well today, including through his appointment at the infusion clinic.  Reportedly had a syncopal episode at home. He denies any pain or injuries related to this, though he is noted to have some superficial skin tears to the bilateral forearms and left anterior shin.  He is unsure if he might have hit his head.  On EMS arrival he was noted to be in V. tach, positive pulse throughout.  Received 150 mg amiodarone bolus with EMS, completing on arrival.  Soft blood pressure with EMS, 90s over 60s.  On arrival to the emergency department, patient has no acute complaints.  He denies any chest pain, shortness of breath, palpitations, nausea, vomiting.  His only complaint is that his lips are dry.   Past Medical Hx Past Medical History:  Diagnosis Date  . AICD (automatic cardioverter/defibrillator) present   . Anemia   . Arrhythmia    a fib  . Atrial fibrillation ([place])    on Coumadin  . CAD (coronary artery disease)   . Cancer [facility]) 2001   Prostate, XRT and implant  . CHF (congestive heart failure) ([place])   . COPD (chronic obstructive pulmonary disease) ([place])   . Erectile dysfunction   . GERD (gastroesophageal reflux disease)   . Hyperlipidemia   . Hypertension   . Ischemic cardiomyopathy    s/p AICD/pacer 2009, replaced 2010 [facility]  . MI (myocardial  infarction) ([place]) 1988  . Personal history of prostate cancer 2001   s/p XRT and implant ([place])  . Prostate cancer ([place])   . Sleep apnea, obstructive    uses CPAP nightly  . Thrombocytopenia ([place])   . Tubular adenoma    colon polyp  . Vitamin B 12 deficiency     Problem List Patient Active Problem List   Diagnosis Date Noted  . Anemia, unspecified 11/23/2019  . AKI (acute kidney injury) ([place]) 11/09/2019  . Enterococcal bacteremia 10/28/2019  . ICD (implantable cardioverter-defibrillator) infection ([place]) 10/28/2019  . Pleural effusion due to CHF (congestive heart failure) ([place]) 09/17/2019  . Hospital discharge follow-up 08/19/2019  . V tach ([place]) 08/09/2019  . Defibrillator discharge 08/09/2019  . V-tach ([place]) 08/09/2019  . 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 Portal hypertension ([place]) 07/19/2019  . 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 Obesity 04/20/2016  . Hearing loss of left ear due to cerumen impaction 04/19/2016  . Hypotension 03/10/2016  . Allergic rhinitis 02/02/2016  . Acute on chronic systolic CHF (congestive heart failure) ([place]) 01/16/2016  . Chronic renal insufficiency 01/16/2016  . Prediabetes 01/16/2016  . 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 S/P implantation of automatic cardioverter/defibrillator (AICD) 10/21/2015  . Diverticulosis of colon without hemorrhage 10/21/2015  . Internal hemorrhoids 10/21/2015  . Atrial fibrillation ([place]) 10/15/2015  . Tubular adenoma of colon 06/04/2015  . COPD (chronic obstructive pulmonary disease) ([place]) 04/20/2015  . Iron deficiency anemia 04/13/2015  . Thrombocytopenia ([place]) 12/01/2014  .  Hyperlipidemia LDL goal <70 12/01/2014  . Chronic systolic heart failure ([place]) 10/08/2014  . Diarrhea 05/07/2013  . Long term current use of anticoagulant therapy 11/02/2011  . Pernicious anemia 11/02/2011  . Ischemic cardiomyopathy   . Sleep apnea, obstructive     Past Surgical Hx Past Surgical History:    Procedure Laterality Date  . CARDIAC CATHETERIZATION    . COLONOSCOPY WITH PROPOFOL [name] 05/30/2015   Procedure: COLONOSCOPY WITH PROPOFOL;  Surgeon: Manya Silvas, MD;  Location: [facility] ENDOSCOPY;  Service: Endoscopy;  Laterality: [name];  . COLONOSCOPY WITH PROPOFOL [name] 07/16/2019   Procedure: COLONOSCOPY WITH PROPOFOL;  Surgeon: Toledo, Benay Pike, MD;  Location: [facility] ENDOSCOPY;  Service: Gastroenterology;  Laterality: [name];  . CORONARY ARTERY BYPASS GRAFT  1988  . ESOPHAGOGASTRODUODENOSCOPY [name] 05/30/2015   Procedure: ESOPHAGOGASTRODUODENOSCOPY (EGD);  Surgeon: Manya Silvas, MD;  Location[facility] ENDOSCOPY;  Service: Endoscopy;  Laterality: [name];  . ESOPHAGOGASTRODUODENOSCOPY (EGD) WITH PROPOFOL [name] 07/16/2019   Procedure: ESOPHAGOGASTRODUODENOSCOPY (EGD) WITH PROPOFOL;  Surgeon: Toledo, Benay Pike, MD;  Location: [facility] ENDOSCOPY;  Service: Gastroenterology;  Laterality: [name];  . IMPLANTABLE CARDIOVERTER DEFIBRILLATOR (ICD) GENERATOR CHANGE Left 10/02/2019   Procedure: ICD GENERATOR CHANGE;  Surgeon: Isaias Cowman, MD;  Location: [facility] ORS;  Service: Cardiovascular;  Laterality: Left;  . IMPLANTABLE CARDIOVERTER DEFIBRILLATOR GENERATOR CHANGE  April 2014   Bi V RRR  . INSERT / REPLACE / REMOVE PACEMAKER  2014  . VASECTOMY      Medications Prior to Admission medications   Medication Sig Start Date End Date Taking? Authorizing Provider  albuterol (PROVENTIL HFA;VENTOLIN HFA) 108 (90 Base) MCG/ACT inhaler Inhale 2 puffs into the lungs every 6 (six) hours as needed for wheezing or shortness of breath. 12/26/18   Guse, Jacquelynn Cree, FNP  allopurinol (ZYLOPRIM) 100 MG tablet Take 1 tablet (100 mg total) by mouth daily. 08/17/19   Crecencio Mc, MD  amiodarone (PACERONE) 400 MG tablet Take 200 mg by mouth daily.    [provider]  apixaban (ELIQUIS) 5 MG TABS tablet Take 5 mg by mouth 2 (two) times daily.  06/12/19   [provider]  colchicine 0.6 MG tablet Take 0.6 mg by mouth as  needed.     [provider]  Cyanocobalamin 1000 MCG SUBL Place 1 tablet (1,000 mcg total) under the tongue every morning. 06/15/17   Crecencio Mc, MD  escitalopram (LEXAPRO) 10 MG tablet Take 1 tablet (10 mg total) by mouth daily. 01/22/20   Crecencio Mc, MD  folic acid (FOLVITE) 1 MG tablet Take 1 tablet (1 mg total) by mouth daily. 11/28/19   Lloyd Huger, MD  isosorbide mononitrate (IMDUR) 30 MG 24 hr tablet Take 30 mg by mouth daily.    [provider]  magnesium oxide (MAG-OX) 400 MG tablet Take 1 tablet (400 mg total) by mouth 2 (two) times daily. 01/22/20   Crecencio Mc, MD  metolazone (ZAROXOLYN) 2.5 MG tablet Take 1 tablet (2.5 mg total) by mouth 2 (two) times a week. Hold for blood pressure less than 120/50 12/27/19 03/26/20  Darylene Price A, FNP  metoprolol succinate (TOPROL-XL) 50 MG 24 hr tablet Take 1 tablet (50 mg total) by mouth daily. Take with or immediately following a meal. 01/22/20   Crecencio Mc, MD  montelukast (SINGULAIR) 10 MG tablet Take 1 tablet (10 mg total) by mouth at bedtime. 08/17/19   Crecencio Mc, MD  pantoprazole (PROTONIX) 40 MG tablet Take 1 tablet (40 mg total)  by mouth daily. 05/10/19   Crecencio Mc, MD  potassium chloride SA (KLOR-CON) 20 MEQ tablet Take 1 tablet (20 mEq total) by mouth 2 (two) times a week. Take when you take the metolazone 12/27/19   Darylene Price A, FNP  pravastatin (PRAVACHOL) 80 MG tablet Take 80 mg by mouth daily.    [provider]  torsemide (DEMADEX) 20 MG tablet Take 2 tablets (40 mg total) by mouth 2 (two) times daily. 01/01/20 03/31/20  Minna Merritts, MD  umeclidinium-vilanterol [facility] ELLIPTA) 62.5-25 MCG/INH AEPB USE 1 INHALATION DAILY 01/22/20   Crecencio Mc, MD    Allergies Patient has no known allergies.  Family Hx Family History  Problem Relation Age of Onset  . Hypertension Mother   . Hypertension Father   . Cancer Father        prostate, throat  . Emphysema Father   .  Heart disease Maternal Grandmother   . Heart disease Paternal Grandmother     Social Hx Social History   Tobacco Use  . Smoking status: Never Smoker  . Smokeless tobacco: Never Used  Substance Use Topics  . Alcohol use: Yes    Alcohol/week: 4.0 standard drinks    Types: 2 Standard drinks or equivalent, 2 Cans of beer per week    Comment: Mixed drinks  . Drug use: No     Review of Systems  Constitutional: Negative for fever, chills. Eyes: Negative for visual changes. ENT: Negative for sore throat. Cardiovascular: Negative for chest pain. Positive for VT. Respiratory: Negative for shortness of breath. Gastrointestinal: Negative for nausea, vomiting.  Genitourinary: Negative for dysuria. Musculoskeletal: Negative for leg swelling. Skin: Negative for rash. Neurological: Negative for headaches. Positive for syncope.    Physical Exam  Vital Signs: ED Triage Vitals  Enc Vitals Group     BP 01/23/20 1908 95/68     Pulse Rate 01/23/20 1908 (!) 141     Resp 01/23/20 1908 (!) 29     Temp 01/23/20 1908 97.7 F (36.5 C)     Temp Source 01/23/20 1908 Oral     SpO2 01/23/20 1908 100 %     Weight --      Height 01/23/20 1904 6' (1.829 m)     Head Circumference --      Peak Flow --      Pain Score 01/23/20 1904 0     Pain Loc --      Pain Edu? --      Excl. in [place]? --     Constitutional: Alert and oriented.  Head: Normocephalic. Atraumatic. Eyes: Conjunctivae clear. Sclera anicteric. Nose: No congestion. No rhinorrhea. Mouth/Throat: MM very dry.  Neck: No stridor.   Cardiovascular: Tachycardic. Extremities well perfused. Respiratory: Normal respiratory effort.  Lungs clear anteriorly.  Gastrointestinal: Soft. Non-tender. Non-distended.  Musculoskeletal: Symmetric pitting edema to mid shin.  Neurologic:  Normal speech and language. No gross focal neurologic deficits are appreciated.  Skin: Skin tear to bilateral forearms. Skin tear to left anterior shin with small  amount of lymphedema weeping.  Psychiatric: Mood and affect are appropriate for situation.  EKG  Personally reviewed.   Obtained 01/23/2020 19:06  Rate: 139 Rhythm: wide complex regular tachycardia, c/f VT Axis: normal Intervals: wide QRS Wide-complex, regular tachycardia, consistent with ventricular tachycardia  Obtained 01/23/2020 20:01  Rate: 139 Rhythm: wide-complex regular tachycardia Axis: appears normal Intervals: wide QRS Complex, regular tachycardia, concerning for ventricular tachycardia    Radiology  CXR: IMPRESSION:  1. Stable  marked cardiomegaly. Pulmonary venous hypertension and  minimal interstitial pulmonary edema.  2. Moderately large RIGHT pleural effusion and associated passive  atelectasis and/or pneumonia in the RIGHT LOWER LOBE.   CT head: IMPRESSION:  1. No acute intracranial pathology.  2. Mild age-related atrophy and chronic microvascular ischemic  changes.    Procedures  Procedure(s) performed (including critical care):  .Critical Care Performed by: Lilia Pro., MD Authorized by: Lilia Pro., MD   Critical care provider statement:    Critical care time (minutes):  60   Critical care was necessary to treat or prevent imminent or life-threatening deterioration of the following conditions:  Cardiac failure   Critical care was time spent personally by me on the following activities:  Discussions with consultants, evaluation of patient's response to treatment, examination of patient, ordering and performing treatments and interventions, ordering and review of laboratory studies, ordering and review of radiographic studies, pulse oximetry, re-evaluation of patient's condition, obtaining history from patient or surrogate and review of old charts .Sedation  Date/Time: 01/23/2020 9:59 PM Performed by: Lilia Pro., MD Authorized by: Lilia Pro., MD   Consent:    Consent obtained:  Verbal   Consent given by:  Patient   Risks  discussed:  Respiratory compromise necessitating ventilatory assistance and intubation Indications:    Procedure performed:  Cardioversion Pre-sedation assessment:    Time since last food or drink:  Unable to specify   NPO status caution: unable to specify NPO status     ASA classification: class 3 - patient with severe systemic disease     Mallampati score:  II - soft palate, uvula, fauces visible   Pre-sedation assessments completed and reviewed: airway patency, cardiovascular function, hydration status, mental status, nausea/vomiting, pain level and respiratory function     Pre-sedation assessment completed:  01/23/2020 7:30 PM Immediate pre-procedure details:    Reassessment: Patient reassessed immediately prior to procedure     Reviewed: vital signs and relevant labs/tests     Verified: bag valve mask available, emergency equipment available, intubation equipment available, IV patency confirmed and oxygen available   Procedure details (see MAR for exact dosages):    Preoxygenation:  Nasal cannula   Sedation:  Etomidate   Intended level of sedation: deep   Intra-procedure monitoring:  Blood pressure monitoring, cardiac monitor, continuous pulse oximetry, frequent vital sign checks and frequent LOC assessments   Intra-procedure events: none     Total Provider sedation time (minutes):  48 Post-procedure details:    Attendance: Constant attendance by certified staff until patient recovered     Recovery: Patient returned to pre-procedure baseline     Post-sedation assessments completed and reviewed: airway patency, mental status and respiratory function     Patient tolerance:  Tolerated well, no immediate complications .Cardioversion  Date/Time: 01/23/2020 10:03 PM Performed by: Lilia Pro., MD Authorized by: Lilia Pro., MD   Consent:    Consent obtained:  Verbal   Consent given by:  Patient   Risks discussed:  Induced arrhythmia and pain   Alternatives discussed:  No  treatment and alternative treatment Pre-procedure details:    Cardioversion basis:  Emergent   Rhythm:  Ventricular tachycardia   Electrode placement:  Anterior-lateral Patient sedated: Yes. Refer to sedation procedure documentation for details of sedation.  Attempt one:    Cardioversion mode:  Synchronous   Waveform:  Biphasic   Shock (Joules):  200   Shock outcome:  No change in rhythm Post-procedure details:  Patient status:  Awake   Patient tolerance of procedure:  Tolerated well, no immediate complications Comments:     Patient sedated w/ etomidate - see sedation notes. Attempted synchronized cardioversion with 200 J x 3 without any change in rhythm. Patient tolerated well. Returned to baseline after sedation.       Initial Impression / Assessment and Plan / ED Course  79 y.o. male who presents to the ED for syncope, found to be in ventricular tachycardia with EMS, with a pulse.  Presentation most consistent with arrhythmogenic syncope, but also consider anemia, other electrolyte abnormality, intracranial.  Patient does have a history of VT, on amiodarone.  AICD in place.  Denies any shocks delivered by his AICD. (Pacemaker interrogated, discussed with [employer] rep, his pulse rate currently [140-150s] does not meet the minimum threshold of the AICD for shock delivery.)  Received 150 mg amiodarone bolus with EMS prior to arrival, will continue on 1 mg/min infusion and attempt pharmacologic cardioversion, however low threshold for electrical cardioversion should he become unstable.  Patient beginning to feel more nauseous, unwell, BP decreased to 70s/40s. Discussed sedation and electrical cardioversion w/ patient, who agrees.   See procedure note above.  Despite sedation and multiple attempts at electrical cardioversion, patient remains in ventricular tachycardia.  After resolution of sedation, patient has returned to baseline, no acute complications, continues to be  asymptomatic w/o complaints, blood pressures with systolics remaining 62V to 90s.  Discussed with Dr. Saralyn Pilar given my concern for refractory VT.  Advised an additional 150 mg amiodarone bolus and continue infusion.  Will attempt to touch base with Dr. Lovena Le of [facility], as patient sees Dr. Rockey Situ.   Discussed case with Dr. Lovena Le (on call for [facility] and EP), who advises transfer to [facility] for potential electrophysiology lab cardioversion.  Patient and daughter agreeable.  We will proceed with transfer.  CT head obtained given syncope with unclear head injury on anticoagulation, which is negative.  Other labs reveal anemia near baseline.  Creatinine minimally increased from prior.  Normal magnesium.  Potassium 3.2, though hemolyzed.  BNP elevated.  High-sensitivity troponin minimally elevated 20-30s.  COVID negative.  Transport to take to [facility] - remains w/ no acute complaints, BP remaining stable around 80s/60s, amiodarone infusing.    Final Clinical Impression(s) / ED Diagnosis  Final diagnoses:  Ventricular tachycardia ([place])  Syncope, unspecified syncope type     Note:  This document was prepared using Dragon voice recognition software and may include unintentional dictation errors.     Lilia Pro., MD 01/23/20 2248

## 2020-01-23 NOTE — Sedation Documentation (Signed)
Pt breathing on his own, SpO2 100. 4L [place]

## 2020-01-23 NOTE — Sedation Documentation (Addendum)
Pt in continued vtach

## 2020-01-23 NOTE — Sedation Documentation (Signed)
Pt placed back on 4L [place]. 

## 2020-01-23 NOTE — ED Notes (Signed)
EMTALA complete prior to pt transport to [facility].

## 2020-01-23 NOTE — ED Notes (Signed)
Pt in [place]. Dr. Joan Mayans aware. Pt alert and orientedx4

## 2020-01-23 NOTE — ED Notes (Signed)
Pt placed on pads 

## 2020-01-24 ENCOUNTER — Inpatient Hospital Stay (HOSPITAL_COMMUNITY): Payer: Medicare Other

## 2020-01-24 ENCOUNTER — Ambulatory Visit: Payer: Medicare Other | Admitting: Podiatry

## 2020-01-24 ENCOUNTER — Encounter (HOSPITAL_COMMUNITY): Payer: Self-pay | Admitting: Internal Medicine

## 2020-01-24 DIAGNOSIS — I5022 Chronic systolic (congestive) heart failure: Secondary | ICD-10-CM

## 2020-01-24 DIAGNOSIS — I2510 Atherosclerotic heart disease of native coronary artery without angina pectoris: Secondary | ICD-10-CM

## 2020-01-24 DIAGNOSIS — I361 Nonrheumatic tricuspid (valve) insufficiency: Secondary | ICD-10-CM

## 2020-01-24 DIAGNOSIS — I472 Ventricular tachycardia, unspecified: Secondary | ICD-10-CM

## 2020-01-24 DIAGNOSIS — J90 Pleural effusion, not elsewhere classified: Secondary | ICD-10-CM

## 2020-01-24 DIAGNOSIS — I959 Hypotension, unspecified: Secondary | ICD-10-CM

## 2020-01-24 DIAGNOSIS — I340 Nonrheumatic mitral (valve) insufficiency: Secondary | ICD-10-CM

## 2020-01-24 DIAGNOSIS — Z9889 Other specified postprocedural states: Secondary | ICD-10-CM

## 2020-01-24 LAB — PROTEIN, PLEURAL OR PERITONEAL FLUID: Total protein, fluid: <3 g/dL

## 2020-01-24 LAB — CBC
HCT: 25.5 % — ABNORMAL LOW (ref 39.0–52.0)
Hemoglobin: 8.1 g/dL — ABNORMAL LOW (ref 13.0–17.0)
MCH: 29.6 pg (ref 26.0–34.0)
MCHC: 31.8 g/dL (ref 30.0–36.0)
MCV: 93.1 fL (ref 80.0–100.0)
Platelets: 89 10*3/uL — ABNORMAL LOW (ref 150–400)
RBC: 2.74 MIL/uL — ABNORMAL LOW (ref 4.22–5.81)
RDW: 17.2 % — ABNORMAL HIGH (ref 11.5–15.5)
WBC: 6.9 10*3/uL (ref 4.0–10.5)
nRBC: 0 % (ref 0.0–0.2)

## 2020-01-24 LAB — LACTATE DEHYDROGENASE, PLEURAL OR PERITONEAL FLUID: LD, Fluid: 29 U/L — ABNORMAL HIGH (ref 3–23)

## 2020-01-24 LAB — ECHOCARDIOGRAM COMPLETE
Height: 74 in
Weight: 3530.89 oz

## 2020-01-24 LAB — BASIC METABOLIC PANEL
Anion gap: 15 (ref 5–15)
BUN: 55 mg/dL — ABNORMAL HIGH (ref 8–23)
CO2: 27 mmol/L (ref 22–32)
Calcium: 8.6 mg/dL — ABNORMAL LOW (ref 8.9–10.3)
Chloride: 93 mmol/L — ABNORMAL LOW (ref 98–111)
Creatinine, Ser: 3.45 mg/dL — ABNORMAL HIGH (ref 0.61–1.24)
GFR calc Af Amer: 19 mL/min — ABNORMAL LOW (ref >60)
GFR calc non Af Amer: 16 mL/min — ABNORMAL LOW (ref >60)
Glucose, Bld: 136 mg/dL — ABNORMAL HIGH (ref 70–99)
Potassium: 3 mmol/L — ABNORMAL LOW (ref 3.5–5.1)
Sodium: 135 mmol/L (ref 135–145)

## 2020-01-24 LAB — POTASSIUM: Potassium: 4.1 mmol/L (ref 3.5–5.1)

## 2020-01-24 LAB — PROTIME-INR
INR: 2 — ABNORMAL HIGH (ref 0.8–1.2)
Prothrombin Time: 22.8 seconds — ABNORMAL HIGH (ref 11.4–15.2)

## 2020-01-24 LAB — MAGNESIUM: Magnesium: 2.4 mg/dL (ref 1.7–2.4)

## 2020-01-24 LAB — APTT: aPTT: 40 seconds — ABNORMAL HIGH (ref 24–36)

## 2020-01-24 LAB — MRSA PCR SCREENING: MRSA by PCR: NEGATIVE

## 2020-01-24 MED ORDER — FOLIC ACID 1 MG PO TABS
1.0000 mg | ORAL_TABLET | Freq: Every day | ORAL | Status: DC
  Administered 2020-01-24 – 2020-02-01 (×7): 1 mg via ORAL
  Filled 2020-01-24 (×8): qty 1

## 2020-01-24 MED ORDER — CHLORHEXIDINE GLUCONATE CLOTH 2 % EX PADS
6.0000 | MEDICATED_PAD | Freq: Every day | CUTANEOUS | Status: DC
  Administered 2020-01-27 – 2020-02-01 (×4): 6 via TOPICAL

## 2020-01-24 MED ORDER — METOLAZONE 2.5 MG PO TABS
2.5000 mg | ORAL_TABLET | ORAL | Status: DC
  Administered 2020-01-24: 09:00:00 2.5 mg via ORAL
  Filled 2020-01-24: qty 1

## 2020-01-24 MED ORDER — ALLOPURINOL 100 MG PO TABS
100.0000 mg | ORAL_TABLET | Freq: Every day | ORAL | Status: DC
  Administered 2020-01-24 – 2020-01-29 (×6): 100 mg via ORAL
  Filled 2020-01-24 (×7): qty 1

## 2020-01-24 MED ORDER — PRAVASTATIN SODIUM 40 MG PO TABS
80.0000 mg | ORAL_TABLET | Freq: Every day | ORAL | Status: DC
  Administered 2020-01-24 – 2020-01-29 (×6): 80 mg via ORAL
  Filled 2020-01-24 (×6): qty 2

## 2020-01-24 MED ORDER — INFLUENZA VAC A&B SA ADJ QUAD 0.5 ML IM PRSY
0.5000 mL | PREFILLED_SYRINGE | INTRAMUSCULAR | Status: DC
  Filled 2020-01-24: qty 0.5

## 2020-01-24 MED ORDER — PANTOPRAZOLE SODIUM 40 MG PO TBEC
40.0000 mg | DELAYED_RELEASE_TABLET | Freq: Every day | ORAL | Status: DC
  Administered 2020-01-24 – 2020-02-01 (×7): 40 mg via ORAL
  Filled 2020-01-24 (×8): qty 1

## 2020-01-24 MED ORDER — MAGNESIUM OXIDE 400 (241.3 MG) MG PO TABS
400.0000 mg | ORAL_TABLET | Freq: Two times a day (BID) | ORAL | Status: DC
  Administered 2020-01-24 – 2020-01-26 (×6): 400 mg via ORAL
  Filled 2020-01-24 (×6): qty 1

## 2020-01-24 MED ORDER — METOPROLOL SUCCINATE ER 50 MG PO TB24: 50.0000 mg | ORAL_TABLET | Freq: Every day | ORAL | Status: DC

## 2020-01-24 MED ORDER — ESCITALOPRAM OXALATE 10 MG PO TABS
10.0000 mg | ORAL_TABLET | Freq: Every day | ORAL | Status: DC
  Administered 2020-01-24 – 2020-02-01 (×7): 10 mg via ORAL
  Filled 2020-01-24 (×8): qty 1

## 2020-01-24 MED ORDER — POTASSIUM CHLORIDE CRYS ER 20 MEQ PO TBCR
20.0000 meq | EXTENDED_RELEASE_TABLET | ORAL | Status: DC
  Administered 2020-01-24: 20 meq via ORAL
  Filled 2020-01-24: qty 1

## 2020-01-24 MED ORDER — MAGNESIUM SULFATE IN D5W 1-5 GM/100ML-% IV SOLN
1.0000 g | Freq: Once | INTRAVENOUS | Status: AC
  Administered 2020-01-24: 1 g via INTRAVENOUS
  Filled 2020-01-24: qty 100

## 2020-01-24 MED ORDER — VITAMIN B-12 1000 MCG PO TABS
1000.0000 ug | ORAL_TABLET | Freq: Every morning | ORAL | Status: DC
  Administered 2020-01-24 – 2020-01-29 (×6): 1000 ug via ORAL
  Filled 2020-01-24 (×7): qty 1

## 2020-01-24 MED ORDER — UMECLIDINIUM-VILANTEROL 62.5-25 MCG/INH IN AEPB
1.0000 | INHALATION_SPRAY | Freq: Every day | RESPIRATORY_TRACT | Status: DC
  Filled 2020-01-24: qty 14

## 2020-01-24 MED ORDER — ACETAMINOPHEN 325 MG PO TABS
650.0000 mg | ORAL_TABLET | Freq: Four times a day (QID) | ORAL | Status: DC | PRN
  Administered 2020-01-25 – 2020-01-26 (×2): 650 mg via ORAL
  Filled 2020-01-24 (×2): qty 2

## 2020-01-24 MED ORDER — ORAL CARE MOUTH RINSE
15.0000 mL | Freq: Two times a day (BID) | OROMUCOSAL | Status: DC
  Administered 2020-01-24 – 2020-02-01 (×13): 15 mL via OROMUCOSAL

## 2020-01-24 MED ORDER — ALBUTEROL SULFATE (2.5 MG/3ML) 0.083% IN NEBU: 3.0000 mL | INHALATION_SOLUTION | Freq: Four times a day (QID) | RESPIRATORY_TRACT | Status: DC | PRN

## 2020-01-24 MED ORDER — FLUTICASONE FUROATE-VILANTEROL 200-25 MCG/INH IN AEPB
1.0000 | INHALATION_SPRAY | Freq: Every day | RESPIRATORY_TRACT | Status: DC
  Administered 2020-01-25 – 2020-02-01 (×8): 1 via RESPIRATORY_TRACT
  Filled 2020-01-24: qty 28

## 2020-01-24 MED ORDER — UMECLIDINIUM BROMIDE 62.5 MCG/INH IN AEPB
1.0000 | INHALATION_SPRAY | Freq: Every day | RESPIRATORY_TRACT | Status: DC
  Administered 2020-01-25 – 2020-02-01 (×8): 1 via RESPIRATORY_TRACT
  Filled 2020-01-24 (×2): qty 7

## 2020-01-24 MED ORDER — TORSEMIDE 20 MG PO TABS
40.0000 mg | ORAL_TABLET | Freq: Two times a day (BID) | ORAL | Status: DC
  Administered 2020-01-24 – 2020-01-27 (×7): 40 mg via ORAL
  Filled 2020-01-24 (×7): qty 2

## 2020-01-24 MED ORDER — ACETAMINOPHEN 650 MG RE SUPP: 650.0000 mg | Freq: Four times a day (QID) | RECTAL | Status: DC | PRN

## 2020-01-24 MED ORDER — POTASSIUM CHLORIDE CRYS ER 20 MEQ PO TBCR
60.0000 meq | EXTENDED_RELEASE_TABLET | Freq: Once | ORAL | Status: AC
  Administered 2020-01-24: 05:00:00 60 meq via ORAL
  Filled 2020-01-24: qty 3

## 2020-01-24 MED ORDER — ISOSORBIDE MONONITRATE ER 30 MG PO TB24
30.0000 mg | ORAL_TABLET | Freq: Every day | ORAL | Status: DC
  Administered 2020-01-24: 30 mg via ORAL
  Filled 2020-01-24: qty 1

## 2020-01-24 MED ORDER — MONTELUKAST SODIUM 10 MG PO TABS
10.0000 mg | ORAL_TABLET | Freq: Every day | ORAL | Status: DC
  Administered 2020-01-24 – 2020-01-31 (×8): 10 mg via ORAL
  Filled 2020-01-24 (×8): qty 1

## 2020-01-24 MED ORDER — PNEUMOCOCCAL VAC POLYVALENT 25 MCG/0.5ML IJ INJ: 0.5000 mL | INJECTION | INTRAMUSCULAR | Status: DC

## 2020-01-24 NOTE — H&P (Signed)
Cardiology Admission History and Physical:   Patient ID: Rick Gilmore MRN: [id number]; DOB: 05/05/1941   Admission date: 01/23/2020  Primary Care Provider: Crecencio Mc, MD Primary Cardiologist: Ida Rogue Primary Electrophysiologist:  Lovena Le  Chief Complaint:  VT  Patient Profile:   Rick Gilmore is a 79 y.o. male with h/o HFrEF (EF 25-30%), CAD s/p prior MI w/ h/o CABG (1988 with LIMA-LAD, SVG-D1, SVG-OM1) and PCI (h/o PCI to OM1 graft in march 2003) at [facility], h/o VT s/p ablation in the past, portal HTN, s/p ICD, permanent afib, OSA presents as a transfer from [place] for refractory VT.  History of Present Illness:   Rick Gilmore has the above hx, including h/o VT s/p ablation, h/o MI w/ extensive myocardial scarring (the likely driver for his VT), s/p ICD presented to the ED at [facility] after a syncopal episode. He had an appointment earlier in the day at the infusion clinic.  Patient reports feeling well today, including through his appointment at the infusion clinic.  Reportedly had a syncopal episode at home. He denies any pain or injuries related to this, though he is noted to have some superficial skin tears to the bilateral forearms and left anterior shin.  He is unsure if he might have hit his head.  On EMS arrival he was noted to be in V. tach, positive pulse throughout.  Received 150 mg amiodarone bolus with EMS, completing on arrival.  Soft blood pressure with EMS, 90s over 60s. His VT was under the VT zone for his device, and there has been no shock discharge from the device up to this point. On arrival to the emergency department, patient had no acute complaints.   He remains asymptomatic at this time. He denies any chest pain, shortness of breath, palpitations, nausea, vomiting.   He was unaware that his heart was out of rhythm (denies palpitations, chest discomfort or any other sx to suggest an arrhythmia). He has recently had some issues w/ device/pocket infection  requiring explant and re-implant at [facility], which took place in Dec 2020. It does not sound like he has had any issues w/ recurrent VT for a while until today and has been maintained on amiodarone therapy (both for his afib as well as the VT).   Heart Pathway Score:     Past Medical History:  Diagnosis Date  . AICD (automatic cardioverter/defibrillator) present   . Anemia   . Arrhythmia    a fib  . Atrial fibrillation ([place])    on Coumadin  . CAD (coronary artery disease)   . Cancer [facility]) 2001   Prostate, XRT and implant  . CHF (congestive heart failure) ([place])   . COPD (chronic obstructive pulmonary disease) ([place])   . Erectile dysfunction   . GERD (gastroesophageal reflux disease)   . Hyperlipidemia   . Hypertension   . Ischemic cardiomyopathy    s/p AICD/pacer 2009, replaced 2010 [facility]  . MI (myocardial infarction) ([place]) 1988  . Personal history of prostate cancer 2001   s/p XRT and implant ([place])  . Prostate cancer ([place])   . Sleep apnea, obstructive    uses CPAP nightly  . Thrombocytopenia ([place])   . Tubular adenoma    colon polyp  . Vitamin B 12 deficiency     Past Surgical History:  Procedure Laterality Date  . CARDIAC CATHETERIZATION    . COLONOSCOPY WITH PROPOFOL [name] 05/30/2015   Procedure: COLONOSCOPY WITH PROPOFOL;  Surgeon: Manya Silvas, MD;  Location: [facility] ENDOSCOPY;  Service: Endoscopy;  Laterality: [name];  . COLONOSCOPY WITH PROPOFOL [name] 07/16/2019   Procedure: COLONOSCOPY WITH PROPOFOL;  Surgeon: Toledo, Benay Pike, MD;  Location: [facility] ENDOSCOPY;  Service: Gastroenterology;  Laterality: [name];  . CORONARY ARTERY BYPASS GRAFT  1988  . ESOPHAGOGASTRODUODENOSCOPY [name] 05/30/2015   Procedure: ESOPHAGOGASTRODUODENOSCOPY (EGD);  Surgeon: Manya Silvas, MD;  Location: [facility] ENDOSCOPY;  Service: Endoscopy;  Laterality: [name];  . ESOPHAGOGASTRODUODENOSCOPY (EGD) WITH PROPOFOL [name] 07/16/2019   Procedure: ESOPHAGOGASTRODUODENOSCOPY (EGD) WITH PROPOFOL;  Surgeon: Toledo, Benay Pike,  MD;  Location: [facility] ENDOSCOPY;  Service: Gastroenterology;  Laterality: [name];  . IMPLANTABLE CARDIOVERTER DEFIBRILLATOR (ICD) GENERATOR CHANGE Left 10/02/2019   Procedure: ICD GENERATOR CHANGE;  Surgeon: Isaias Cowman, MD;  Location: [facility] ORS;  Service: Cardiovascular;  Laterality: Left;  . IMPLANTABLE CARDIOVERTER DEFIBRILLATOR GENERATOR CHANGE  April 2014   Bi V RRR  . INSERT / REPLACE / REMOVE PACEMAKER  2014  . VASECTOMY       Medications Prior to Admission: Prior to Admission medications   Medication Sig Start Date End Date Taking? Authorizing Provider  albuterol (PROVENTIL HFA;VENTOLIN HFA) 108 (90 Base) MCG/ACT inhaler Inhale 2 puffs into the lungs every 6 (six) hours as needed for wheezing or shortness of breath. 12/26/18   Guse, Jacquelynn Cree, FNP  allopurinol (ZYLOPRIM) 100 MG tablet Take 1 tablet (100 mg total) by mouth daily. 08/17/19   Crecencio Mc, MD  amiodarone (PACERONE) 400 MG tablet Take 200 mg by mouth daily.    [provider]  apixaban (ELIQUIS) 5 MG TABS tablet Take 5 mg by mouth 2 (two) times daily.  06/12/19   [provider]  colchicine 0.6 MG tablet Take 0.6 mg by mouth as needed.     [provider]  Cyanocobalamin 1000 MCG SUBL Place 1 tablet (1,000 mcg total) under the tongue every morning. 06/15/17   Crecencio Mc, MD  escitalopram (LEXAPRO) 10 MG tablet Take 1 tablet (10 mg total) by mouth daily. 01/22/20   Crecencio Mc, MD  folic acid (FOLVITE) 1 MG tablet Take 1 tablet (1 mg total) by mouth daily. 11/28/19   Lloyd Huger, MD  isosorbide mononitrate (IMDUR) 30 MG 24 hr tablet Take 30 mg by mouth daily.    [provider]  magnesium oxide (MAG-OX) 400 MG tablet Take 1 tablet (400 mg total) by mouth 2 (two) times daily. 01/22/20   Crecencio Mc, MD  metolazone (ZAROXOLYN) 2.5 MG tablet Take 1 tablet (2.5 mg total) by mouth 2 (two) times a week. Hold for blood pressure less than 120/50 12/27/19 03/26/20  Darylene Price A,  FNP  metoprolol succinate (TOPROL-XL) 50 MG 24 hr tablet Take 1 tablet (50 mg total) by mouth daily. Take with or immediately following a meal. 01/22/20   Crecencio Mc, MD  montelukast (SINGULAIR) 10 MG tablet Take 1 tablet (10 mg total) by mouth at bedtime. 08/17/19   Crecencio Mc, MD  pantoprazole (PROTONIX) 40 MG tablet Take 1 tablet (40 mg total) by mouth daily. 05/10/19   Crecencio Mc, MD  potassium chloride SA (KLOR-CON) 20 MEQ tablet Take 1 tablet (20 mEq total) by mouth 2 (two) times a week. Take when you take the metolazone 12/27/19   Darylene Price A, FNP  pravastatin (PRAVACHOL) 80 MG tablet Take 80 mg by mouth daily.    [provider]  torsemide (DEMADEX) 20 MG tablet Take 2 tablets (40 mg total) by mouth 2 (two) times daily. 01/01/20 03/31/20  Minna Merritts, MD  umeclidinium-vilanterol [facility] ELLIPTA) 62.5-25 MCG/INH AEPB USE 1 INHALATION DAILY 01/22/20   Crecencio Mc, MD     Allergies:   No Known Allergies  Social History:   Social History   Socioeconomic History  . Marital status: Divorced    Spouse name: Not on file  . Number of children: Not on file  . Years of education: Not on file  . Highest education level: Not on file  Occupational History  . Occupation: Retired  Tobacco Use  . Smoking status: Never Smoker  . Smokeless tobacco: Never Used  Substance and Sexual Activity  . Alcohol use: Yes    Alcohol/week: 4.0 standard drinks    Types: 2 Standard drinks or equivalent, 2 Cans of beer per week    Comment: Mixed drinks  . Drug use: No  . Sexual activity: Never  Other Topics Concern  . Not on file  Social History Narrative  . Not on file   Social Determinants of Health   Financial Resource Strain:   . Difficulty of Paying Living Expenses: Not on file  Food Insecurity:   . Worried About Charity fundraiser in the Last Year: Not on file  . Ran Out of Food in the Last Year: Not on file  Transportation Needs:   . Lack of Transportation  (Medical): Not on file  . Lack of Transportation (Non-Medical): Not on file  Physical Activity:   . Days of Exercise per Week: Not on file  . Minutes of Exercise per Session: Not on file  Stress:   . Feeling of Stress : Not on file  Social Connections:   . Frequency of Communication with Friends and Family: Not on file  . Frequency of Social Gatherings with Friends and Family: Not on file  . Attends Religious Services: Not on file  . Active Member of Clubs or Organizations: Not on file  . Attends Archivist Meetings: Not on file  . Marital Status: Not on file  Intimate Partner Violence:   . Fear of Current or Ex-Partner: Not on file  . Emotionally Abused: Not on file  . Physically Abused: Not on file  . Sexually Abused: Not on file    Family History:   The patient's family history includes Cancer in his father; Emphysema in his father; Heart disease in his maternal grandmother and paternal grandmother; Hypertension in his father and mother.    ROS:  Please see the history of present illness.  All other ROS reviewed and negative.     Physical Exam/Data:   Vitals:   01/23/20 2316 01/23/20 2330 01/24/20 0000  BP: (!) 91/57 (!) 94/57 (!) 91/54  Resp: 18 17 20   Temp: 97.6 F (36.4 C)    TempSrc: Oral    SpO2: 98%  97%  Weight: 100.1 kg    Height: 6\' 2"  (1.88 m)     No intake or output data in the 24 hours ending 01/24/20 0013 Last 3 Weights 01/23/2020 01/01/2020 12/27/2019  Weight (lbs) 220 lb 10.9 oz 225 lb 236 lb 6.4 oz  Weight (kg) 100.1 kg 102.059 kg 107.23 kg     Body mass index is 28.33 kg/m.  General:  Well nourished, well developed, in no acute distress HEENT: normal Lymph: no adenopathy Neck: no JVD Endocrine:  No thryomegaly Vascular: No carotid bruits; DP pulses not palpable  Cardiac:  normal S1, S2; RRR; no murmur  Lungs:  clear to auscultation bilaterally, no wheezing, rhonchi  or rales  Abd: soft, nontender, no hepatomegaly  Ext: no  edema Musculoskeletal:  No deformities, BUE and BLE strength normal and equal Skin: warm and dry  Neuro:  CNs 2-12 intact, no focal abnormalities noted Psych:  Normal affect    EKG:  The ECG that was done 01-23-20 was personally reviewed and demonstrates V-paced rhythm with HR in 70s  Relevant CV Studies: 10-01-19 TTE 1. Left ventricular ejection fraction, by visual estimation, is 25 to  30%. The left ventricle has severely decreased function. Moderately  increased left ventricular size. There is no left ventricular hypertrophy.  2. Ischemic cardiomyopathy.  3. Large anterior, lateral and posterolateral myocardial infarction.  4. Global right ventricle has normal systolic function.The right  ventricular size is mildly enlarged. No increase in right ventricular wall  thickness.  5. Left atrial size was severely dilated.  6. Right atrial size was moderately dilated.  7. Small pericardial effusion.  8. Moderate thickening of the mitral valve leaflet(s).  9. The mitral valve is normal in structure. Severe mitral valve  regurgitation.  10. The tricuspid valve is normal in structure. Tricuspid valve  regurgitation moderate.  11. The aortic valve is normal in structure. Aortic valve regurgitation is  trivial by color flow Doppler.  12. The pulmonic valve was grossly normal. Pulmonic valve regurgitation is  trivial by color flow Doppler.  13. Moderately elevated pulmonary artery systolic pressure.  14. Pulmonary hypertension is moderate.   Laboratory Data:  High Sensitivity Troponin:   Recent Labs  Lab 01/23/20 1907 01/23/20 2110  TROPONINIHS 24* 31*      Chemistry Recent Labs  Lab 01/23/20 1907  NA 136  K 3.2*  CL 103  CO2 19*  GLUCOSE 152*  BUN 49*  CREATININE 2.75*  CALCIUM 7.5*  GFRNONAA 21*  GFRAA 24*  ANIONGAP 14    Recent Labs  Lab 01/23/20 1907  PROT 5.7*  ALBUMIN 3.3*  AST 27  ALT 9  ALKPHOS 56  BILITOT 1.5*   Hematology Recent Labs  Lab  01/23/20 1106 01/23/20 1907  WBC 7.5 9.4  RBC 3.04* 3.10*  HGB 8.8* 9.1*  HCT 29.1* 29.6*  MCV 95.7 95.5  MCH 28.9 29.4  MCHC 30.2 30.7  RDW 17.1* 17.2*  PLT 109* 135*   BNP Recent Labs  Lab 01/23/20 1907  BNP 1,070.0*    DDimer No results for input(s): DDIMER in the last 168 hours.   Radiology/Studies:  CT Head Wo Contrast  Result Date: 01/23/2020 CLINICAL DATA:  79 year old male with fall. EXAM: CT HEAD WITHOUT CONTRAST TECHNIQUE: Contiguous axial images were obtained from the base of the skull through the vertex without intravenous contrast. COMPARISON:  None. FINDINGS: Brain: There is mild age-related atrophy and chronic microvascular ischemic changes. There is no acute intracranial hemorrhage. No mass effect or midline shift. No extra-axial fluid collection. Vascular: No hyperdense vessel or unexpected calcification. Skull: Normal. Negative for fracture or focal lesion. Sinuses/Orbits: There is partial opacification of the left sphenoid sinus. The remainder of the visualized paranasal sinuses and mastoid air cells are clear. Probable partial right mastoidectomy. Clinical correlation is recommended. Other: Air within the vasculature in the left side of the face, likely iatrogenic. IMPRESSION: 1. No acute intracranial pathology. 2. Mild age-related atrophy and chronic microvascular ischemic changes. Electronically Signed   By: Anner Crete M.D.   On: 01/23/2020 21:46   DG Chest Port 1 View  Result Date: 01/23/2020 CLINICAL DATA:  79 year old with an indwelling pacing defibrillator who presents  from home with syncope and an episode of ventricular tachycardia. Hypotension upon EMS arrival. Patient received an iron infusion today. EXAM: PORTABLE CHEST 1 VIEW COMPARISON:  10/27/2019. FINDINGS: Prior sternotomy for CABG. Cardiac silhouette markedly enlarged, unchanged. Interval removal of the previous LEFT pacing subclavian defibrillator and placement of a new RIGHT subclavian pacing  defibrillator. External pacing pads are present. Pulmonary venous hypertension and minimal interstitial pulmonary edema. Moderately large RIGHT pleural effusion and associated consolidation in the RIGHT LOWER LOBE. No visible LEFT pleural effusion. IMPRESSION: 1. Stable marked cardiomegaly. Pulmonary venous hypertension and minimal interstitial pulmonary edema. 2. Moderately large RIGHT pleural effusion and associated passive atelectasis and/or pneumonia in the RIGHT LOWER LOBE. Electronically Signed   By: Evangeline Dakin M.D.   On: 01/23/2020 19:38         Assessment and Plan:   1. VT: most likely due to extensive myocardial scar. Overall rate is sub-VT zone and pt has not had any therapy from his device. Pt has had ablation in the past, and has been on chronic amiodarone. He is V-paced currently, on amiodarone gtt, with HR in 70s. Will cont amiodarone gtt tonight and bolus if necessary if VT recurs. EP to see in the AM. Will get repeat TTE for the AM. 2. CAD: no active anginal sx; assumption is VT is scar and not ischemia-driven, but ischemia workup could be part of plan if suspected.  3. Afib: chronic, persistent. Pt on [place] for CVA prophylaxis at home (eliquis). Will hold eliquis for now in case EP moves ahead w/ procedure tomorrow. 4. HFrEF: TTE repeat pending for AM as above. Does not seem decompensated from volume standpoint at this time 5. Hypotension: MAP ~70. On BB alone in regard to BP-dropping meds. Will cont BB given VT  For questions or updates, please contact [place] Please consult [email or web address] for contact info under        Signed, Rudean Curt, MD, [facility] 01/24/2020 12:13 AM

## 2020-01-24 NOTE — Plan of Care (Signed)
  Problem: Education: Goal: Knowledge of cardiac device and self-care will improve Outcome: Progressing Goal: Ability to safely manage health related needs after discharge will improve Outcome: Progressing Goal: Individualized Educational Video(s) Outcome: Progressing   Problem: Cardiac: Goal: Ability to achieve and maintain adequate cardiopulmonary perfusion will improve Outcome: Progressing   

## 2020-01-24 NOTE — Consult Note (Signed)
NAME:  Rick Gilmore, MRN:  [id number], DOB:  [birth year], LOS: 1 ADMISSION DATE:  01/23/2020, CONSULTATION DATE:  01/24/20 REFERRING MD:  Lovena Le, CHIEF COMPLAINT:  VT   Brief History   79 year old man with ischemic cardiomyopathy (post CABG and PCI) w/ AICD in place, prior VT and ablation, cirrhosis w/ portal HTN presenting with Vtach storm found to have pleural effusion for which pulmonology is consulted.  History of present illness   79 year old man with ischemic cardiomyopathy (post CABG and PCI) w/ AICD in place, prior VT and ablation, cirrhosis w/ portal HTN presenting with Vtach storm found to have pleural effusion for which pulmonology is consulted.  Patient admits to orthopnea and increasing weakness, DOE recently since Sept 2020.  Has had effusion drained back in October 2020 with 2.3L removed, lymphocyte predominant transudate, inflammatory cells on cytology.  Also carries diagnosis of COPD, PFTs 2017 show moderate obstruction with + BD response, lung volumes borderline restriction with air trapping, DLCO moderately reduced with no correction for alveolar ventilation.  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  No evidence of varices on recent EGD.  Socially, nonsmoker, worked in [employer], history of heavy secondhand smoke exposure.  Echo showing pulm HTN which precludes shunt.  Past Medical History  Ischemic cardiomyopathy, prior VT post ablation with AICD in place Permanent afib OSA on CPAP COPD HTN HLD COPD  Significant Hospital Events   01/24/20  Consults:  EP, Cardiology, PCCM  Procedures:  01/24/20 Inocencio Homes  Significant Diagnostic Tests:  Echo >>  Micro Data:  COVID neg  Antimicrobials:  None   Interim history/subjective:  Consulted  Objective   Blood pressure (!) 79/52, pulse 69, temperature 97.6 F (36.4 C), temperature source Oral, resp. rate (!) 21, height _0  (1.88 m), weight 100.1 kg, SpO2 96  %.        Intake/Output Summary (Last 24 hours) at 01/24/2020 1452 Last data filed at 01/24/2020 1300 Gross per 24 hour  Intake 491.92 ml  Output 400 ml  Net 91.92 ml   Filed Weights   01/23/20 2316 01/24/20 0016  Weight: 100.1 kg 100.1 kg    Examination: General: 79 year old man in NAD HENT: MMM, trachea midline Lungs: Diminished R base, large effusion on [place] Cardiovascular: RRR, paced on monitor, ext warm Abdomen: Soft, +BS Extremities: lower ext slightly cyanotic, no edema Neuro: moves all 4 ext to command Skin: myriad of age-related changes, skin tags, cafe au lait spots  Resolved Hospital Problem list   None  Assessment & Plan:  Pleural effusion- hx of cirrhosis and CHF are likely culprits.  Path neg but did show inflammation which is a little odd. Will repeat labs but perform mostly for therapeutic purposes.  In terms of prevention, not a shunt candidate due to pulm HTN, not a transplant candidate so treatment relies on diuresis as able and intermittent thoracenteses for therapeutic purposes.  If going on hospice, a pleurX can be placed. - Thoracentesis, send for usual labs - We will comment on results once path is back - Consider palliative consult - Please call if we can be of further help - If needs drained as OP, can refer to our office  COPD- asthma overlap suspected given marked BD response, will consolidate inhalers to trelegy (breo + incruse as IP, change to trelegy instead of anoro as OP)  Vtach storm- management per primary   Labs   CBC: Recent Labs  Lab  01/23/20 1106 01/23/20 1907 01/24/20 0304  WBC 7.5 9.4 6.9  NEUTROABS 5.8 6.9  --   HGB 8.8* 9.1* 8.1*  HCT 29.1* 29.6* 25.5*  MCV 95.7 95.5 93.1  PLT 109* 135* 89*    Basic Metabolic Panel: Recent Labs  Lab 01/23/20 1907 01/24/20 0304 01/24/20 1048  NA 136 135  --   K 3.2* 3.0* 4.1  CL 103 93*  --   CO2 19* 27  --   GLUCOSE 152* 136*  --   BUN 49* 55*  --   CREATININE 2.75* 3.45*  --    CALCIUM 7.5* 8.6*  --   MG 1.9 2.4  --    GFR: Estimated Creatinine Clearance: 22.3 mL/min (A) (by C-G formula based on SCr of 3.45 mg/dL (H)). Recent Labs  Lab 01/23/20 1106 01/23/20 1907 01/24/20 0304  WBC 7.5 9.4 6.9    Liver Function Tests: Recent Labs  Lab 01/23/20 1907  AST 27  ALT 9  ALKPHOS 56  BILITOT 1.5*  PROT 5.7*  ALBUMIN 3.3*   No results for input(s): LIPASE, AMYLASE in the last 168 hours. No results for input(s): AMMONIA in the last 168 hours.  ABG No results found for: PHART, PCO2ART, PO2ART, HCO3, TCO2, ACIDBASEDEF, O2SAT   Coagulation Profile: Recent Labs  Lab 01/23/20 1907 01/24/20 0304  INR 1.9* 2.0*    Cardiac Enzymes: No results for input(s): CKTOTAL, CKMB, CKMBINDEX, TROPONINI in the last 168 hours.  HbA1C: Hemoglobin A1C  Date/Time Value Ref Range Status  11/21/2014 05:28 AM 5.9 4.2 - 6.3 % Final    Comment:    The American Diabetes Association recommends that a primary goal of therapy should be <7% and that physicians should reevaluate the treatment regimen in patients with HbA1c values consistently >8%.   06/24/2014 07:30 AM 5.6 4.2 - 6.3 % Final    Comment:    The American Diabetes Association recommends that a primary goal of therapy should be <7% and that physicians should reevaluate the treatment regimen in patients with HbA1c values consistently >8%.    Hgb A1c MFr Bld  Date/Time Value Ref Range Status  11/14/2019 03:15 PM 5.2 4.6 - 6.5 % Final    Comment:    Glycemic Control Guidelines for People with Diabetes:Non Diabetic:  <6%Goal of Therapy: <7%Additional Action Suggested:  >8%   05/28/2019 09:33 AM 5.9 4.6 - 6.5 % Final    Comment:    Glycemic Control Guidelines for People with Diabetes:Non Diabetic:  <6%Goal of Therapy: <7%Additional Action Suggested:  >8%     CBG: Recent Labs  Lab 01/23/20 2347  GLUCAP 163*    Review of Systems:    Positive Symptoms in bold:  Constitutional fevers, chills, weight  loss, fatigue, anorexia, malaise  Eyes decreased vision, double vision, eye irritation  Ears, Nose, Mouth, Throat sore throat, trouble swallowing, sinus congestion  Cardiovascular chest pain, paroxysmal nocturnal dyspnea, lower ext edema, palpitations   Respiratory SOB, cough, DOE, hemoptysis, wheezing  Gastrointestinal nausea, vomiting, diarrhea  Genitourinary burning with urination, trouble urinating  Musculoskeletal joint aches, joint swelling, back pain  Integumentary  rashes, skin lesions  Neurological focal weakness, focal numbness, trouble speaking, headaches  Psychiatric depression, anxiety, confusion  Endocrine polyuria, polydipsia, cold intolerance, heat intolerance  Hematologic abnormal bruising, abnormal bleeding, unexplained nose bleeds  Allergic/Immunologic recurrent infections, hives, swollen lymph nodes     Past Medical History  He,  has a past medical history of AICD (automatic cardioverter/defibrillator) present, Anemia, Arrhythmia, Atrial fibrillation ([place]),  CAD (coronary artery disease), Cancer ([place]) (2001), CHF (congestive heart failure) ([place]), COPD (chronic obstructive pulmonary disease) ([place]), Erectile dysfunction, GERD (gastroesophageal reflux disease), Hyperlipidemia, Hypertension, Ischemic cardiomyopathy, MI (myocardial infarction) ([place]) (1988), Personal history of prostate cancer (2001), Prostate cancer ([place]), Sleep apnea, obstructive, Thrombocytopenia ([place]), Tubular adenoma, and Vitamin B 12 deficiency.   Surgical History    Past Surgical History:  Procedure Laterality Date  . CARDIAC CATHETERIZATION    . COLONOSCOPY WITH PROPOFOL [name] 05/30/2015   Procedure: COLONOSCOPY WITH PROPOFOL;  Surgeon: Manya Silvas, MD;  Location: [facility] ENDOSCOPY;  Service: Endoscopy;  Laterality: [name];  . COLONOSCOPY WITH PROPOFOL [name] 07/16/2019   Procedure: COLONOSCOPY WITH PROPOFOL;  Surgeon: Toledo, Benay Pike, MD;  Location: [facility] ENDOSCOPY;  Service: Gastroenterology;  Laterality:  [name];  . CORONARY ARTERY BYPASS GRAFT  1988  . ESOPHAGOGASTRODUODENOSCOPY [name] 05/30/2015   Procedure: ESOPHAGOGASTRODUODENOSCOPY (EGD);  Surgeon: Manya Silvas, MD;  Location: [facility] ENDOSCOPY;  Service: Endoscopy;  Laterality: [name];  . ESOPHAGOGASTRODUODENOSCOPY (EGD) WITH PROPOFOL [name] 07/16/2019   Procedure: ESOPHAGOGASTRODUODENOSCOPY (EGD) WITH PROPOFOL;  Surgeon: Toledo, Benay Pike, MD;  Location: [facility] ENDOSCOPY;  Service: Gastroenterology;  Laterality: [name];  . IMPLANTABLE CARDIOVERTER DEFIBRILLATOR (ICD) GENERATOR CHANGE Left 10/02/2019   Procedure: ICD GENERATOR CHANGE;  Surgeon: Isaias Cowman, MD;  Location: [facility] ORS;  Service: Cardiovascular;  Laterality: Left;  . IMPLANTABLE CARDIOVERTER DEFIBRILLATOR GENERATOR CHANGE  April 2014   Bi V RRR  . INSERT / REPLACE / REMOVE PACEMAKER  2014  . VASECTOMY       Social History   reports that he has never smoked. He has never used smokeless tobacco. He reports current alcohol use of about 4.0 standard drinks of alcohol per week. He reports that he does not use drugs.   Family History   His family history includes Cancer in his father; Emphysema in his father; Heart disease in his maternal grandmother and paternal grandmother; Hypertension in his father and mother.   Allergies No Known Allergies   Home Medications  Prior to Admission medications   Medication Sig Start Date End Date Taking? Authorizing Provider  albuterol (PROVENTIL HFA;VENTOLIN HFA) 108 (90 Base) MCG/ACT inhaler Inhale 2 puffs into the lungs every 6 (six) hours as needed for wheezing or shortness of breath. 12/26/18   Guse, Jacquelynn Cree, FNP  allopurinol (ZYLOPRIM) 100 MG tablet Take 1 tablet (100 mg total) by mouth daily. 08/17/19   Crecencio Mc, MD  amiodarone (PACERONE) 400 MG tablet Take 200 mg by mouth daily.    [provider]  apixaban (ELIQUIS) 5 MG TABS tablet Take 5 mg by mouth 2 (two) times daily.  06/12/19   [provider]  colchicine 0.6 MG  tablet Take 0.6 mg by mouth as needed.     [provider]  Cyanocobalamin 1000 MCG SUBL Place 1 tablet (1,000 mcg total) under the tongue every morning. 06/15/17   Crecencio Mc, MD  escitalopram (LEXAPRO) 10 MG tablet Take 1 tablet (10 mg total) by mouth daily. 01/22/20   Crecencio Mc, MD  folic acid (FOLVITE) 1 MG tablet Take 1 tablet (1 mg total) by mouth daily. 11/28/19   Lloyd Huger, MD  isosorbide mononitrate (IMDUR) 30 MG 24 hr tablet Take 30 mg by mouth daily.    [provider]  magnesium oxide (MAG-OX) 400 MG tablet Take 1 tablet (400 mg total) by mouth 2 (two) times daily. 01/22/20   Crecencio Mc, MD  metolazone (ZAROXOLYN) 2.5 MG tablet Take  1 tablet (2.5 mg total) by mouth 2 (two) times a week. Hold for blood pressure less than 120/50 12/27/19 03/26/20  Darylene Price A, FNP  metoprolol succinate (TOPROL-XL) 50 MG 24 hr tablet Take 1 tablet (50 mg total) by mouth daily. Take with or immediately following a meal. 01/22/20   Crecencio Mc, MD  montelukast (SINGULAIR) 10 MG tablet Take 1 tablet (10 mg total) by mouth at bedtime. 08/17/19   Crecencio Mc, MD  pantoprazole (PROTONIX) 40 MG tablet Take 1 tablet (40 mg total) by mouth daily. 05/10/19   Crecencio Mc, MD  potassium chloride SA (KLOR-CON) 20 MEQ tablet Take 1 tablet (20 mEq total) by mouth 2 (two) times a week. Take when you take the metolazone 12/27/19   Darylene Price A, FNP  pravastatin (PRAVACHOL) 80 MG tablet Take 80 mg by mouth daily.    [provider]  torsemide (DEMADEX) 20 MG tablet Take 2 tablets (40 mg total) by mouth 2 (two) times daily. 01/01/20 03/31/20  Minna Merritts, MD  umeclidinium-vilanterol [facility] ELLIPTA) 62.5-25 MCG/INH AEPB USE 1 INHALATION DAILY 01/22/20   Crecencio Mc, MD

## 2020-01-24 NOTE — Progress Notes (Signed)
CRITICAL VALUE STICKER  CRITICAL VALUE: Potassium is 3.0   DATE & TIME NOTIFIED: 01/24/20 4:23 AM  MD NOTIFIED: Dr. Hassell Done, cardiology  TIME OF NOTIFICATION: 01/24/20 4:23 AM   RESPONSE: 60 meq of Potassium Chloride PO

## 2020-01-24 NOTE — Progress Notes (Signed)
  Echocardiogram 2D Echocardiogram has been performed.  Rick Gilmore 01/24/2020, 10:53 AM

## 2020-01-24 NOTE — Procedures (Signed)
Thoracentesis Procedure Note  Pre-operative Diagnosis: Right Pleural Effusion   Post-operative Diagnosis: same  Indications: Diagnostic evaluation of pleural fluid and therapeutic relief of dyspnea  Procedure Details  Consent: Informed consent was obtained. Risks of the procedure were discussed including: infection, bleeding, pain, pneumothorax.  Under sterile conditions the patient was positioned. Betadine solution and sterile drapes were utilized.  1% plain lidocaine was used to anesthetize the 8th rib space. Fluid was obtained without any difficulties and minimal blood loss.  A dressing was applied to the wound and wound care instructions were provided.   Findings 2000 ml of clear yellow pleural fluid was obtained. A sample was sent to for LDH, protein, cell counts, cytology as well as for infection analysis.  Complications:  None; patient tolerated the procedure well.          Condition: stable  Plan A follow up chest x-ray was ordered.   Procedure performed under direct supervision of Dr. Tamala Julian and with ultrasound guidance.   Noe Gens, MSN, NP-C [facility] 01/24/2020, 3:18 PM   Please see [email or web address] for pager details.

## 2020-01-24 NOTE — Progress Notes (Signed)
RT assisted pt with setup of home CPAP machine. Pt is on auto titrate with a min 12 max 20 w/no oxygen bled into the system. RT will continue to monitor.

## 2020-01-24 NOTE — Consult Note (Addendum)
Cardiology Consultation:   Patient ID: Rick Gilmore MRN: [id number]; DOB: 1941/07/09  Admit date: 01/23/2020 Date of Consult: 01/24/2020  Primary Care Provider: Crecencio Mc, MD Primary Cardiologist: Dr. Rockey Situ (fairly new to the service) Primary Electrophysiologist: Dr. Nehemiah Massed, DR. Parachjos   Patient Profile:   Rick Gilmore is a 79 y.o. male with a hx of CAD (CABG 1988, PCI to Neihart graft 2003), CKD (III-IV), chronic CHF (systolic), ICM, VT, ICD, AFib, COPD, HTN, HLD, OSA, prostate cancer, cirrhosis, portal HTN, VHD w/severe MRwho is being seen today for the evaluation of VT at the request of Dr. Hassell Done.   Hx of VT ablation 08/27/2019  Hx of prior ICD implanted 2009, gen change 2010, 2014, 10/02/2019 Bacteremia (enterococcal), pocket infection Nov 2020, system extraction 10/30/2019 and R IJ temp PM New system implant (MDT CRT-D) right side 11/05/2019 >> discharged 11/06/2019 [facility]  Chronically on amiodarone  History of Present Illness:   Rick Gilmore was most recently admitted to [facility] 11/08/2019 for unclear LLE pain, no cardiac complaints He was discharged 11/10/2019 it seems with unclear caiuse for his LE electrical type pains though were resolved. ICD was interrogated without shocks.  He comes to [facility] last night after a syncopal event at home.  EMS apparently found him in VT, with pulse, SBP 90's, continued on amiodarone gtt in the ER he was cardioverted with 200J (x3)at [facility] it seems without change in rhythm, at some point (I am unclear, he converted to a V paced rhythm VT was reported below his therapy zones and not shocked He was initially seen at [facility] and transferred to [facility] He arrived here in a V paced rhythm   I am unclear when he converted out of VT, the pt states he was told by RN with him in transfer that he had "an arrest on the way", I talked with the RN here, there was no official report of that given.  He denies any kind of CP or cardiac awareness of  late.  He denies feeling SOB now or particularly at home He had been to the cancer center for B12 infusion for his anemia and once home he felt weak, tired, sat down and shortly afterwards fainted.  He lives alon, thinks he was out only moments.  He woke without CP or symptoms other then feeling tired, weak.  He at this time continues to  Generally feel very tired.  No CP, no palpitations.  No SOB He does not wear O2 at home, here is on HFO2  LABS K+ 3.2 > 3.0 Mag 1.9 > 2.4 BUN/Creat 49/2.75 >> 55/3.45 BNP 1070 HS Trop 24 > 31 WBC 6.9 H/H 8.1/25.5 Plts 109 > 135 > 89 INR 2.0   SBP's generally about 90 Remains on amiodarone gtt @ 60   Device information MDT CRT-P (no A lead) Implanted 10/31/2019 [place] transmission yesterday 14 monitored VT episodes No treated episodes Presenting rhythm VS 142bpm One EGM appears to have variable morphology to V rhythm His 1st therapy zone is VT 176bpm   CXR IMPRESSION: 1. Stable marked cardiomegaly. Pulmonary venous hypertension and minimal interstitial pulmonary edema. 2. Moderately large RIGHT pleural effusion and associated passive atelectasis and/or pneumonia in the RIGHT LOWER LOBE.   Heart Pathway Score:     Past Medical History:  Diagnosis Date   AICD (automatic cardioverter/defibrillator) present    Anemia    Arrhythmia    a fib   Atrial fibrillation (HCC)    on Coumadin   CAD (  coronary artery disease)    Cancer ([place]) 2001   Prostate, XRT and implant   CHF (congestive heart failure) (HCC)    COPD (chronic obstructive pulmonary disease) ([place])    Erectile dysfunction    GERD (gastroesophageal reflux disease)    Hyperlipidemia    Hypertension    Ischemic cardiomyopathy    s/p AICD/pacer 2009, replaced 2010 [facility]   MI (myocardial infarction) ([place]) 1988   Personal history of prostate cancer 2001   s/p XRT and implant ([place])   Prostate cancer ([place])    Sleep apnea, obstructive    uses CPAP nightly   Thrombocytopenia  (HCC)    Tubular adenoma    colon polyp   Vitamin B 12 deficiency     Past Surgical History:  Procedure Laterality Date   CARDIAC CATHETERIZATION     COLONOSCOPY WITH PROPOFOL [name] 05/30/2015   Procedure: COLONOSCOPY WITH PROPOFOL;  Surgeon: Manya Silvas, MD;  Location: [facility] ENDOSCOPY;  Service: Endoscopy;  Laterality: [name];   COLONOSCOPY WITH PROPOFOL [name] 07/16/2019   Procedure: COLONOSCOPY WITH PROPOFOL;  Surgeon: Toledo, Benay Pike, MD;  Location: [facility] ENDOSCOPY;  Service: Gastroenterology;  Laterality: [name];   CORONARY ARTERY BYPASS GRAFT  1988   ESOPHAGOGASTRODUODENOSCOPY [name] 05/30/2015   Procedure: ESOPHAGOGASTRODUODENOSCOPY (EGD);  Surgeon: Manya Silvas, MD;  Location: [facility] ENDOSCOPY;  Service: Endoscopy;  Laterality: [name];   ESOPHAGOGASTRODUODENOSCOPY (EGD) WITH PROPOFOL [name] 07/16/2019   Procedure: ESOPHAGOGASTRODUODENOSCOPY (EGD) WITH PROPOFOL;  Surgeon: Toledo, Benay Pike, MD;  Location: [facility] ENDOSCOPY;  Service: Gastroenterology;  Laterality: [name];   IMPLANTABLE CARDIOVERTER DEFIBRILLATOR (ICD) GENERATOR CHANGE Left 10/02/2019   Procedure: ICD GENERATOR CHANGE;  Surgeon: Isaias Cowman, MD;  Location: [facility] ORS;  Service: Cardiovascular;  Laterality: Left;   IMPLANTABLE CARDIOVERTER DEFIBRILLATOR GENERATOR CHANGE  April 2014   Bi V RRR   INSERT / REPLACE / REMOVE PACEMAKER  2014   VASECTOMY       Home Medications:  Prior to Admission medications   Medication Sig Start Date End Date Taking? Authorizing Provider  albuterol (PROVENTIL HFA;VENTOLIN HFA) 108 (90 Base) MCG/ACT inhaler Inhale 2 puffs into the lungs every 6 (six) hours as needed for wheezing or shortness of breath. 12/26/18   Guse, Jacquelynn Cree, FNP  allopurinol (ZYLOPRIM) 100 MG tablet Take 1 tablet (100 mg total) by mouth daily. 08/17/19   Crecencio Mc, MD  amiodarone (PACERONE) 400 MG tablet Take 200 mg by mouth daily.    [provider]  apixaban (ELIQUIS) 5 MG TABS tablet Take 5 mg by mouth 2 (two)  times daily.  06/12/19   [provider]  colchicine 0.6 MG tablet Take 0.6 mg by mouth as needed.     [provider]  Cyanocobalamin 1000 MCG SUBL Place 1 tablet (1,000 mcg total) under the tongue every morning. 06/15/17   Crecencio Mc, MD  escitalopram (LEXAPRO) 10 MG tablet Take 1 tablet (10 mg total) by mouth daily. 01/22/20   Crecencio Mc, MD  folic acid (FOLVITE) 1 MG tablet Take 1 tablet (1 mg total) by mouth daily. 11/28/19   Lloyd Huger, MD  isosorbide mononitrate (IMDUR) 30 MG 24 hr tablet Take 30 mg by mouth daily.    [provider]  magnesium oxide (MAG-OX) 400 MG tablet Take 1 tablet (400 mg total) by mouth 2 (two) times daily. 01/22/20   Crecencio Mc, MD  metolazone (ZAROXOLYN) 2.5 MG tablet Take 1 tablet (2.5 mg total) by mouth 2 (two) times a week.  Hold for blood pressure less than 120/50 12/27/19 03/26/20  Darylene Price A, FNP  metoprolol succinate (TOPROL-XL) 50 MG 24 hr tablet Take 1 tablet (50 mg total) by mouth daily. Take with or immediately following a meal. 01/22/20   Crecencio Mc, MD  montelukast (SINGULAIR) 10 MG tablet Take 1 tablet (10 mg total) by mouth at bedtime. 08/17/19   Crecencio Mc, MD  pantoprazole (PROTONIX) 40 MG tablet Take 1 tablet (40 mg total) by mouth daily. 05/10/19   Crecencio Mc, MD  potassium chloride SA (KLOR-CON) 20 MEQ tablet Take 1 tablet (20 mEq total) by mouth 2 (two) times a week. Take when you take the metolazone 12/27/19   Darylene Price A, FNP  pravastatin (PRAVACHOL) 80 MG tablet Take 80 mg by mouth daily.    [provider]  torsemide (DEMADEX) 20 MG tablet Take 2 tablets (40 mg total) by mouth 2 (two) times daily. 01/01/20 03/31/20  Minna Merritts, MD  umeclidinium-vilanterol [facility] ELLIPTA) 62.5-25 MCG/INH AEPB USE 1 INHALATION DAILY 01/22/20   Crecencio Mc, MD    Inpatient Medications: Scheduled Meds:  allopurinol  100 mg Oral Daily   escitalopram  10 mg Oral Daily   folic acid  1  mg Oral Daily   [START ON 01/25/2020] influenza vaccine adjuvanted  0.5 mL Intramuscular Tomorrow-1000   isosorbide mononitrate  30 mg Oral Daily   magnesium oxide  400 mg Oral BID   mouth rinse  15 mL Mouth Rinse BID   metolazone  2.5 mg Oral Once per day on Mon Thu   metoprolol succinate  50 mg Oral Daily   montelukast  10 mg Oral QHS   pantoprazole  40 mg Oral Daily   [START ON 01/25/2020] pneumococcal 23 valent vaccine  0.5 mL Intramuscular Tomorrow-1000   potassium chloride SA  20 mEq Oral Once per day on Mon Thu   pravastatin  80 mg Oral q1800   torsemide  40 mg Oral BID   umeclidinium-vilanterol  1 puff Inhalation Daily   vitamin B-12  1,000 mcg Oral q morning - 10a   Continuous Infusions:  amiodarone 60 mg/hr (01/24/20 0450)   PRN Meds: acetaminophen **OR** acetaminophen, albuterol  Allergies:   No Known Allergies  Social History:   Social History   Socioeconomic History   Marital status: Divorced    Spouse name: Not on file   Number of children: Not on file   Years of education: Not on file   Highest education level: Not on file  Occupational History   Occupation: Retired  Tobacco Use   Smoking status: Never Smoker   Smokeless tobacco: Never Used  Substance and Sexual Activity   Alcohol use: Yes    Alcohol/week: 4.0 standard drinks    Types: 2 Standard drinks or equivalent, 2 Cans of beer per week    Comment: Mixed drinks   Drug use: No   Sexual activity: Never  Other Topics Concern   Not on file  Social History Narrative   Not on file   Social Determinants of Health   Financial Resource Strain:    Difficulty of Paying Living Expenses: Not on file  Food Insecurity:    Worried About [place] in the Last Year: Not on file   Ran Out of Food in the Last Year: Not on file  Transportation Needs:    Lack of Transportation (Medical): Not on file   Lack of Transportation (Non-Medical): Not on file  Physical Activity:    Days of Exercise per Week:  Not on file   Minutes of Exercise per Session: Not on file  Stress:    Feeling of Stress : Not on file  Social Connections:    Frequency of Communication with Friends and Family: Not on file   Frequency of Social Gatherings with Friends and Family: Not on file   Attends Religious Services: Not on file   Active Member of Clubs or Organizations: Not on file   Attends Archivist Meetings: Not on file   Marital Status: Not on file  Intimate Partner Violence:    Fear of Current or Ex-Partner: Not on file   Emotionally Abused: Not on file   Physically Abused: Not on file   Sexually Abused: Not on file    Family History:   Family History  Problem Relation Age of Onset   Hypertension Mother    Hypertension Father    Cancer Father        prostate, throat   Emphysema Father    Heart disease Maternal Grandmother    Heart disease Paternal Grandmother      ROS:  Please see the history of present illness.  All other ROS reviewed and negative.     Physical Exam/Data:   Vitals:   01/24/20 0530 01/24/20 0600 01/24/20 0630 01/24/20 0700  BP: (!) 94/54 (!) 92/59 94/65 (!) 85/51  Pulse:      Resp: 20 12 20  (!) 27  Temp:      TempSrc:      SpO2: 98% 97% 97% 97%  Weight:      Height:        Intake/Output Summary (Last 24 hours) at 01/24/2020 0805 Last data filed at 01/24/2020 0400 Gross per 24 hour  Intake 200.95 ml  Output 50 ml  Net 150.95 ml   Last 3 Weights 01/24/2020 01/23/2020 01/01/2020  Weight (lbs) 220 lb 10.9 oz 220 lb 10.9 oz 225 lb  Weight (kg) 100.1 kg 100.1 kg 102.059 kg     Body mass index is 28.33 kg/m.  General:  Well nourished, well developed, in no acute distress, he is thin, appears chronically ill HEENT: normal Lymph: no adenopathy Neck: no JVD Endocrine:  No thryomegaly Vascular: No carotid bruits Cardiac:  RRR; 1-2/6SM, no gallops or rubs noted Lungs:  Diminished R side, soft crackles, no wheezing, rhonchi or rales  Abd: soft, nontender  Ext:  1-2+ edema, chronic looking skin changes Musculoskeletal:  No deformities, advanced atrophy for age Skin: warm and dry  Neuro:  no focal abnormalities noted Psych:  Normal affect   EKG:  The EKG was personally reviewed and demonstrates:    VT 139bpm, RBBB  V paced  Telemetry:  Telemetry was personally reviewed and demonstrates:  V paced  Relevant CV Studies:   10/29/2019: TTE AORTA          Values     Normal RangeMAIN PA      Values     Normal Range      Annulus:  nm*  cm    [2 - 3.2]      PA Main:  nm*  cm    [1.5 - 2.1]    Aorta Sin:  nm*  cm    [2.8 - 4]   RIGHT VENTRICLE  ST Junction:  nm*  cm    [2.3 - 3.5]    RV Base:  nm*  cm    [2.5 - 4.1]  Asc.Aorta:  nm*  cm    [2.2 - 3.8]     RV Mid:  nm*  cm    [1.9 - 3.5] LEFT VENTRICLE                         RV Length:  nm*  cm    [  ]        LVIDd:  nm*  cm    [4.2 - 5.8] RIGHT ATRIUM        LVIDs:  nm*  cm    [2.4 - 4]      RA Area:  nm*  cm2   [ <= 20]       LVEDVi:  nm*  ml/m2 [34 - 74]         RAVi:  nm*  ml/m2 [11 - 39]       LVESVi:  nm*  ml/m2 [11 - 31]   INFERIOR VENA CAVA           FS:  nm*  %     [ >= 25]       Max.IVC:   2.8 cm    [ <= 2.1]          SWT:  nm*  cm    [0.6 - 1]      Min.IVC:  nm*  cm    [ <= 1.7]          PWT:  nm*  cm    [0.6 - 1]   __________________ LEFT ATRIUM                           nm* - not measured      LA Diam:  nm*  cm    [3 - 4]      LA Area:  nm*  cm2   [ <= 20]    LA Volume:  nm*  mL    [18 - 58]         LAVi:  nm*  ml/m2 [16 - 34]  AORTIC ROOT         Size: Normal   Dissection: INDETERM FOR DISSECTION  AORTIC VALVE     Leaflets: Tricuspid             Morphology: Normal     Mobility: Fully Mobile  LEFT VENTRICLE                                      Anterior: HYPOCONTRACTILE         Size: Normal                                 Lateral: AKINETIC  Contraction: SEVERE GLOBAL DECREASE                  Septal: HYPOCONTRACTILE   Closest EF: 30%  (Estimated)                         Apical: HYPOCONTRACTILE    LV masses: No Masses                             Inferior: HYPOCONTRACTILE  LVH: [name]                                  Posterior: AKINETIC Dias.FxClass: [name]  MITRAL VALVE     Leaflets: Normal                  Mobility: Fully mobile   Morphology: Normal  LEFT ATRIUM         Size: SEVERELY ENLARGED    LA masses: No masses               Normal IAS  MAIN PA         Size: Normal  PULMONIC VALVE   Morphology: Normal     Mobility: Fully Mobile  RIGHT VENTRICLE         Size: MILDLY ENLARGED           Free wall: HYPOCONTRACTILE  Contraction: MILD GLOBAL DECREASE      RV masses: CATHETER IN RV  TRICUSPID VALVE     Leaflets: Normal                  Mobility: Fully mobile   Morphology: Normal  RIGHT ATRIUM         Size: SEVERELY ENLARGED          RA Other: None    RA masses: CATHETER IN RA  PERICARDIUM        Fluid: No effusion  INFERIOR VENACAVA         Size: DILATED    ABNORMAL RESPIRATORY COLLAPSE  Impression SEVERE LV DYSFUNCTION (See above)   MILD RV SYSTOLIC DYSFUNCTION (See above)   VALVULAR REGURGITATION: SEVERE MR, TRIVIAL PR, MODERATE TR   NO VALVULAR STENOSIS  Laboratory Data:  High Sensitivity Troponin:   Recent Labs  Lab 01/23/20 1907 01/23/20 2110  TROPONINIHS 24* 31*     Chemistry Recent Labs  Lab 01/23/20 1907 01/24/20 0304  NA 136 135  K 3.2* 3.0*  CL 103 93*  CO2 19* 27  GLUCOSE 152* 136*  BUN 49* 55*  CREATININE 2.75* 3.45*  CALCIUM 7.5* 8.6*  GFRNONAA 21* 16*  GFRAA 24* 19*  ANIONGAP 14 15    Recent Labs  Lab 01/23/20 1907  PROT 5.7*  ALBUMIN 3.3*  AST 27  ALT 9  ALKPHOS 56  BILITOT 1.5*   Hematology Recent Labs  Lab 01/23/20 1106 01/23/20 1907 01/24/20 0304  WBC 7.5 9.4 6.9  RBC 3.04* 3.10* 2.74*  HGB 8.8* 9.1* 8.1*  HCT 29.1* 29.6* 25.5*  MCV 95.7 95.5 93.1  MCH 28.9 29.4 29.6  MCHC 30.2 30.7 31.8  RDW 17.1* 17.2* 17.2*  PLT 109* 135* 89*   BNP Recent Labs  Lab 01/23/20 1907   BNP 1,070.0*    DDimer No results for input(s): DDIMER in the last 168 hours.   Radiology/Studies:  CT Head Wo Contrast  Result Date: 01/23/2020 CLINICAL DATA:  79 year old male with fall. EXAM: CT HEAD WITHOUT CONTRAST TECHNIQUE: Contiguous axial images were obtained from the base of the skull through the vertex without intravenous contrast. COMPARISON:  None. FINDINGS: Brain: There is mild age-related atrophy and chronic microvascular ischemic changes. There is no acute intracranial hemorrhage. No mass effect or midline shift. No extra-axial fluid collection. Vascular: No hyperdense vessel or unexpected calcification. Skull: Normal. Negative for fracture or focal lesion. Sinuses/Orbits: There is partial opacification of the left sphenoid sinus. The remainder of the visualized paranasal  sinuses and mastoid air cells are clear. Probable partial right mastoidectomy. Clinical correlation is recommended. Other: Air within the vasculature in the left side of the face, likely iatrogenic. IMPRESSION: 1. No acute intracranial pathology. 2. Mild age-related atrophy and chronic microvascular ischemic changes. Electronically Signed   By: Anner Crete M.D.   On: 01/23/2020 21:46   DG Chest Port 1 View  Result Date: 01/23/2020 CLINICAL DATA:  79 year old with an indwelling pacing defibrillator who presents from home with syncope and an episode of ventricular tachycardia. Hypotension upon EMS arrival. Patient received an iron infusion today. EXAM: PORTABLE CHEST 1 VIEW COMPARISON:  10/27/2019. FINDINGS: Prior sternotomy for CABG. Cardiac silhouette markedly enlarged, unchanged. Interval removal of the previous LEFT pacing subclavian defibrillator and placement of a new RIGHT subclavian pacing defibrillator. External pacing pads are present. Pulmonary venous hypertension and minimal interstitial pulmonary edema. Moderately large RIGHT pleural effusion and associated consolidation in the RIGHT LOWER LOBE. No  visible LEFT pleural effusion. IMPRESSION: 1. Stable marked cardiomegaly. Pulmonary venous hypertension and minimal interstitial pulmonary edema. 2. Moderately large RIGHT pleural effusion and associated passive atelectasis and/or pneumonia in the RIGHT LOWER LOBE. Electronically Signed   By: Evangeline Dakin M.D.   On: 01/23/2020 19:38     Assessment and Plan:   1. VT     Chronically on amiodarone >> gtt here      Failed cardioversion at [facility] and transferred to [place] in a V paced rhythm currently   2. Hypokalemia     Persists     Since this AM of 3.0 he has gotten in seprate doses a total of 9meq     Recheck ordered   3. Chronic CHF 4. ICM     Mild edema on CXR, large R effusion     Follow closely, may need AHF team, perhaps Pulm medicine, he reports some months ago having R thora done.       5. AFib is described as permanent     On Eliquis at home     Not ordered here     Will d/w Dr. Lovena Le, ? Unclear if he had thoughts towards an ablation     Will hold metoprolol this AM for hypotension   6. AKI on CKI      Dec 5, was 2.6 (previously baseline looks about 1.5)     Getting home metolazone and torsemide     Possibly 2/2 his prolonged VT     May need nephrology help  He has already gotten the diuretics this AM   7. CAD      No agninal complaints      HS not c/w ACS      On BB and nitrate  8. Thrombocytopenia      This has been mentioned for him in the past 9. Anemia     H/H about where he has been it seems     Gets B12 infusions outpatient     Plts decreasing, though has been on 90's historically as well      For questions or updates, please contact [place] Please consult [email or web address] for contact info under   Signed, Baldwin Jamaica, PA-C  01/24/2020 8:05 AM  EP Attending  Patient seen and examined. Agree with the findings as noted above. I have discussed with Dr. Hassell Done and Dr. Joan Mayans at [facility] ED. The patient presents with sustained MMVT  below his device detection associated with near syncope. He has undergone catheter  ablation at [facility]. He underwent ICD system extraction with Biv insertion. The patient had his dose of amio decreased from 400 to 200 about a month ago. He has not had angina. He has reverted to atrial fib with ventricular pacing. He will be admitted with more IV amiodarone. If he remains free of VT we will hold off on ablation as his multiple comorbidities make him a less than great candidate for another ablation. I would anticipate leaving him on amio 400 mg daily for several months.  Mikle Bosworth.D.

## 2020-01-25 ENCOUNTER — Inpatient Hospital Stay (HOSPITAL_COMMUNITY): Payer: Medicare Other

## 2020-01-25 DIAGNOSIS — J449 Chronic obstructive pulmonary disease, unspecified: Secondary | ICD-10-CM

## 2020-01-25 DIAGNOSIS — J95811 Postprocedural pneumothorax: Secondary | ICD-10-CM

## 2020-01-25 LAB — BASIC METABOLIC PANEL
Anion gap: 15 (ref 5–15)
BUN: 56 mg/dL — ABNORMAL HIGH (ref 8–23)
CO2: 26 mmol/L (ref 22–32)
Calcium: 9 mg/dL (ref 8.9–10.3)
Chloride: 95 mmol/L — ABNORMAL LOW (ref 98–111)
Creatinine, Ser: 3.48 mg/dL — ABNORMAL HIGH (ref 0.61–1.24)
GFR calc Af Amer: 18 mL/min — ABNORMAL LOW (ref >60)
GFR calc non Af Amer: 16 mL/min — ABNORMAL LOW (ref >60)
Glucose, Bld: 128 mg/dL — ABNORMAL HIGH (ref 70–99)
Potassium: 4.3 mmol/L (ref 3.5–5.1)
Sodium: 136 mmol/L (ref 135–145)

## 2020-01-25 LAB — CYTOLOGY - NON PAP

## 2020-01-25 LAB — PH, BODY FLUID: pH, Body Fluid: 8

## 2020-01-25 LAB — CBC
HCT: 26.9 % — ABNORMAL LOW (ref 39.0–52.0)
Hemoglobin: 8.5 g/dL — ABNORMAL LOW (ref 13.0–17.0)
MCH: 29.4 pg (ref 26.0–34.0)
MCHC: 31.6 g/dL (ref 30.0–36.0)
MCV: 93.1 fL (ref 80.0–100.0)
Platelets: 106 10*3/uL — ABNORMAL LOW (ref 150–400)
RBC: 2.89 MIL/uL — ABNORMAL LOW (ref 4.22–5.81)
RDW: 17.2 % — ABNORMAL HIGH (ref 11.5–15.5)
WBC: 8.1 10*3/uL (ref 4.0–10.5)
nRBC: 0 % (ref 0.0–0.2)

## 2020-01-25 LAB — PROTIME-INR
INR: 1.7 — ABNORMAL HIGH (ref 0.8–1.2)
Prothrombin Time: 20.2 seconds — ABNORMAL HIGH (ref 11.4–15.2)

## 2020-01-25 MED ORDER — AMIODARONE HCL 200 MG PO TABS
200.0000 mg | ORAL_TABLET | Freq: Two times a day (BID) | ORAL | Status: DC
  Administered 2020-01-25 – 2020-01-27 (×5): 200 mg via ORAL
  Filled 2020-01-25 (×5): qty 1

## 2020-01-25 MED ORDER — VITAMIN K1 10 MG/ML IJ SOLN
5.0000 mg | Freq: Once | INTRAVENOUS | Status: AC
  Administered 2020-01-25: 5 mg via INTRAVENOUS
  Filled 2020-01-25: qty 0.5

## 2020-01-25 MED ORDER — APIXABAN 5 MG PO TABS
5.0000 mg | ORAL_TABLET | Freq: Two times a day (BID) | ORAL | Status: DC
  Administered 2020-01-25 – 2020-01-27 (×5): 5 mg via ORAL
  Filled 2020-01-25 (×5): qty 1

## 2020-01-25 MED ORDER — "THROMBI-PAD 3""X3"" EX PADS"
1.0000 | MEDICATED_PAD | Freq: Once | CUTANEOUS | Status: DC
  Filled 2020-01-25: qty 1

## 2020-01-25 NOTE — Progress Notes (Signed)
RT offered pt assistance with CPAP from home. Pt states everything is okay and he can place himself on without difficulty. Pt has home CPAP w/auto titrate of min 12 max 20 and no oxygen bled into the system. RT will continue to monitor.

## 2020-01-25 NOTE — Progress Notes (Addendum)
Progress Note  Patient Name: Rick Gilmore Date of Encounter: 01/25/2020  Primary Cardiologist: Dr. Rockey Situ New to Dr. Lovena Le  Subjective   no CP, no rest SOB  Inpatient Medications    Scheduled Meds:  allopurinol  100 mg Oral Daily   Chlorhexidine Gluconate Cloth  6 each Topical Daily   escitalopram  10 mg Oral Daily   fluticasone furoate-vilanterol  1 puff Inhalation Daily   folic acid  1 mg Oral Daily   influenza vaccine adjuvanted  0.5 mL Intramuscular Tomorrow-1000   magnesium oxide  400 mg Oral BID   mouth rinse  15 mL Mouth Rinse BID   metolazone  2.5 mg Oral Once per day on Mon Thu   montelukast  10 mg Oral QHS   pantoprazole  40 mg Oral Daily   pneumococcal 23 valent vaccine  0.5 mL Intramuscular Tomorrow-1000   potassium chloride SA  20 mEq Oral Once per day on Mon Thu   pravastatin  80 mg Oral q1800   torsemide  40 mg Oral BID   umeclidinium bromide  1 puff Inhalation Daily   vitamin B-12  1,000 mcg Oral q morning - 10a   Continuous Infusions:  amiodarone 60 mg/hr (01/25/20 0700)   PRN Meds: acetaminophen **OR** acetaminophen, albuterol   Vital Signs    Vitals:   01/25/20 0500 01/25/20 0530 01/25/20 0600 01/25/20 0700  BP: (!) 93/56  99/64 93/62  Pulse:      Resp: (!) 33  19 19  Temp:      TempSrc:      SpO2: 97%  94% 94%  Weight:  99.6 kg    Height:        Intake/Output Summary (Last 24 hours) at 01/25/2020 0813 Last data filed at 01/25/2020 0700 Gross per 24 hour  Intake 867.76 ml  Output 2600 ml  Net -1732.24 ml   Last 3 Weights 01/25/2020 01/24/2020 01/23/2020  Weight (lbs) 219 lb 9.3 oz 220 lb 10.9 oz 220 lb 10.9 oz  Weight (kg) 99.6 kg 100.1 kg 100.1 kg      Telemetry    V paced - Personally Reviewed  ECG    V paced, diffuse ST/T changes similar to prior - Personally Reviewed  Physical Exam   GEN: No acute distress.   Neck: No JVD Cardiac: RRR, 1-2/6SM, no rubs, or gallops.  Respiratory: Moves air better L then R, no absent  BS appreciates, otherwise clear bilaterally. GI: Soft, nontender, non-distended  MS: trace edema, chronic skin changes; No deformity. Neuro:  Nonfocal  Psych: Normal affect   Labs    High Sensitivity Troponin:   Recent Labs  Lab 01/23/20 1907 01/23/20 2110  TROPONINIHS 24* 31*      Chemistry Recent Labs  Lab 01/23/20 1907 01/23/20 1907 01/24/20 0304 01/24/20 1048 01/25/20 0232  NA 136  --  135  --  136  K 3.2*   < > 3.0* 4.1 4.3  CL 103  --  93*  --  95*  CO2 19*  --  27  --  26  GLUCOSE 152*  --  136*  --  128*  BUN 49*  --  55*  --  56*  CREATININE 2.75*  --  3.45*  --  3.48*  CALCIUM 7.5*  --  8.6*  --  9.0  PROT 5.7*  --   --   --   --   ALBUMIN 3.3*  --   --   --   --  AST 27  --   --   --   --   ALT 9  --   --   --   --   ALKPHOS 56  --   --   --   --   BILITOT 1.5*  --   --   --   --   GFRNONAA 21*  --  16*  --  16*  GFRAA 24*  --  19*  --  18*  ANIONGAP 14  --  15  --  15   < > = values in this interval not displayed.     Hematology Recent Labs  Lab 01/23/20 1907 01/24/20 0304 01/25/20 0232  WBC 9.4 6.9 8.1  RBC 3.10* 2.74* 2.89*  HGB 9.1* 8.1* 8.5*  HCT 29.6* 25.5* 26.9*  MCV 95.5 93.1 93.1  MCH 29.4 29.6 29.4  MCHC 30.7 31.8 31.6  RDW 17.2* 17.2* 17.2*  PLT 135* 89* 106*    BNP Recent Labs  Lab 01/23/20 1907  BNP 1,070.0*     DDimer No results for input(s): DDIMER in the last 168 hours.   Radiology    DG Chest 1 View  Result Date: 01/24/2020 CLINICAL DATA:  Status post right-sided thoracentesis EXAM: CHEST  1 VIEW COMPARISON:  01/23/2020 FINDINGS: Prior CABG. Right-sided implanted cardiac device. Stable cardiomegaly. Decreased right-sided pleural effusion. Right costophrenic angle was not included within the field of view. Small right apical pneumothorax (less than 5% lung volume). Streaky left basilar opacity. No left-sided pleural effusion. IMPRESSION: 1. Small right apical pneumothorax (less than 5% lung volume). 2. Decreased  right-sided pleural effusion status post thoracentesis. These results will be called to the ordering clinician or representative by the Radiologist Assistant, and communication documented in the PACS or zVision Dashboard. Electronically Signed   By: Davina Poke D.O.   On: 01/24/2020 15:30   CT Head Wo Contrast  Result Date: 01/23/2020 CLINICAL DATA:  79 year old male with fall. EXAM: CT HEAD WITHOUT CONTRAST TECHNIQUE: Contiguous axial images were obtained from the base of the skull through the vertex without intravenous contrast. COMPARISON:  None. FINDINGS: Brain: There is mild age-related atrophy and chronic microvascular ischemic changes. There is no acute intracranial hemorrhage. No mass effect or midline shift. No extra-axial fluid collection. Vascular: No hyperdense vessel or unexpected calcification. Skull: Normal. Negative for fracture or focal lesion. Sinuses/Orbits: There is partial opacification of the left sphenoid sinus. The remainder of the visualized paranasal sinuses and mastoid air cells are clear. Probable partial right mastoidectomy. Clinical correlation is recommended. Other: Air within the vasculature in the left side of the face, likely iatrogenic. IMPRESSION: 1. No acute intracranial pathology. 2. Mild age-related atrophy and chronic microvascular ischemic changes. Electronically Signed   By: Anner Crete M.D.   On: 01/23/2020 21:46   DG Chest Port 1 View  Result Date: 01/23/2020 CLINICAL DATA:  79 year old with an indwelling pacing defibrillator who presents from home with syncope and an episode of ventricular tachycardia. Hypotension upon EMS arrival. Patient received an iron infusion today. EXAM: PORTABLE CHEST 1 VIEW COMPARISON:  10/27/2019. FINDINGS: Prior sternotomy for CABG. Cardiac silhouette markedly enlarged, unchanged. Interval removal of the previous LEFT pacing subclavian defibrillator and placement of a new RIGHT subclavian pacing defibrillator. External pacing  pads are present. Pulmonary venous hypertension and minimal interstitial pulmonary edema. Moderately large RIGHT pleural effusion and associated consolidation in the RIGHT LOWER LOBE. No visible LEFT pleural effusion. IMPRESSION: 1. Stable marked cardiomegaly. Pulmonary venous hypertension and minimal interstitial  pulmonary edema. 2. Moderately large RIGHT pleural effusion and associated passive atelectasis and/or pneumonia in the RIGHT LOWER LOBE. Electronically Signed   By: Evangeline Dakin M.D.   On: 01/23/2020 19:38     Cardiac Studies    01/24/2020: TTE IMPRESSIONS  1. Left ventricular ejection fraction, by estimation, is 25%. The left  ventricle has severely decreased function. The left ventricle demonstrates  regional wall motion abnormalities . Akinesis of the basal to mid inferior  wall, inferolateral wall, and  anterolateral wall. The remainder of the LV was hypokinetic. The left  ventricular internal cavity size was moderately dilated. Left ventricular  diastolic parameters are consistent with Grade III diastolic dysfunction  (restrictive).   2. Right ventricular systolic function is moderately reduced. The right  ventricular size is normal. There is severely elevated pulmonary artery  systolic pressure. The estimated right ventricular systolic pressure is  49.7 mmHg.   3. Left atrial size was severely dilated.   4. Right atrial size was severely dilated.   5. The mitral valve is abnormal. Severe mitral valve regurgitation. The  posterior leaflet appears restricted in setting of inferior and  inferolateral akinesis. Suspect infarct-related MR. This patient may be a  Mitraclip candidate. No evidence of mitral  stenosis.   6. Tricuspid valve regurgitation is moderate.   7. The aortic valve is tricuspid. Aortic valve regurgitation is not  visualized. No aortic stenosis is present.   8. The inferior vena cava is dilated in size with <50% respiratory  variability, suggesting  right atrial pressure of 15 mmHg.    10/29/2019: TTE AORTA          Values     Normal RangeMAIN PA      Values     Normal Range      Annulus:  nm*  cm    [2 - 3.2]      PA Main:  nm*  cm    [1.5 - 2.1]    Aorta Sin:  nm*  cm    [2.8 - 4]   RIGHT VENTRICLE  ST Junction:  nm*  cm    [2.3 - 3.5]    RV Base:  nm*  cm    [2.5 - 4.1]    Asc.Aorta:  nm*  cm    [2.2 - 3.8]     RV Mid:  nm*  cm    [1.9 - 3.5] LEFT VENTRICLE                         RV Length:  nm*  cm    [  ]        LVIDd:  nm*  cm    [4.2 - 5.8] RIGHT ATRIUM        LVIDs:  nm*  cm    [2.4 - 4]      RA Area:  nm*  cm2   [ <= 20]       LVEDVi:  nm*  ml/m2 [34 - 74]         RAVi:  nm*  ml/m2 [11 - 39]       LVESVi:  nm*  ml/m2 [11 - 31]   INFERIOR VENA CAVA           FS:  nm*  %     [ >= 25]       Max.IVC:   2.8 cm    [ <= 2.1]  SWT:  nm*  cm    [0.6 - 1]      Min.IVC:  nm*  cm    [ <= 1.7]          PWT:  nm*  cm    [0.6 - 1]   __________________ LEFT ATRIUM                           nm* - not measured      LA Diam:  nm*  cm    [3 - 4]      LA Area:  nm*  cm2   [ <= 20]    LA Volume:  nm*  mL    [18 - 58]         LAVi:  nm*  ml/m2 [16 - 34]   AORTIC ROOT         Size: Normal   Dissection: INDETERM FOR DISSECTION  AORTIC VALVE     Leaflets: Tricuspid             Morphology: Normal     Mobility: Fully Mobile  LEFT VENTRICLE                                      Anterior: HYPOCONTRACTILE         Size: Normal                                 Lateral: AKINETIC  Contraction: SEVERE GLOBAL DECREASE                  Septal: HYPOCONTRACTILE   Closest EF: 30%  (Estimated)                        Apical: HYPOCONTRACTILE    LV masses: No Masses                             Inferior: HYPOCONTRACTILE          LVH: [name]                                  Posterior: AKINETIC Dias.FxClass: [name]  MITRAL VALVE     Leaflets: Normal                  Mobility: Fully mobile   Morphology: Normal  LEFT ATRIUM         Size: SEVERELY ENLARGED     LA masses: No masses               Normal IAS  MAIN PA         Size: Normal  PULMONIC VALVE   Morphology: Normal     Mobility: Fully Mobile  RIGHT VENTRICLE         Size: MILDLY ENLARGED           Free wall: HYPOCONTRACTILE  Contraction: MILD GLOBAL DECREASE      RV masses: CATHETER IN RV  TRICUSPID VALVE     Leaflets: Normal                  Mobility: Fully mobile   Morphology: Normal  RIGHT ATRIUM  Size: SEVERELY ENLARGED          RA Other: None    RA masses: CATHETER IN RA  PERICARDIUM        Fluid: No effusion  INFERIOR VENACAVA         Size: DILATED    ABNORMAL RESPIRATORY COLLAPSE   Impression SEVERE LV DYSFUNCTION (See above)   MILD RV SYSTOLIC DYSFUNCTION (See above)   VALVULAR REGURGITATION: SEVERE MR, TRIVIAL PR, MODERATE TR   NO VALVULAR STENOSIS  Patient Profile     79 y.o. 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  Transferred to [facility] for refractory VT    Hx of VT ablation 08/27/2019   Hx of prior ICD implanted 2009, gen change 2010, 2014, 10/02/2019 Bacteremia (enterococcal), pocket infection Nov 2020, system extraction 10/30/2019 and R IJ temp PM New system implant (MDT CRT-D) right side 11/05/2019 >> discharged 11/06/2019 Followed at [facility]   Chronically on amiodarone   Device information MDT CRT-P (no A lead) Implanted 10/31/2019 [place] transmission yesterday 14 monitored VT episodes No treated episodes Presenting rhythm VS 142bpm One EGM appears to have variable morphology to V rhythm His 1st therapy zone is VT 176bpm   Assessment & Plan    1. VT (139bpm)     Chronically on amiodarone >> gtt here      Failed cardioversion at [facility] and transferred to [facility], appears he cardioverted en-route      Remains in a V paced rhythm currently   Dr. Lovena Le has seen and examined the patient  this AM  Will transition to PO amiodarone today, 200mg  BID  Device programming changes (I have discussed with MDT rep) Therapy zones 130 -170's 6burst 84%/6 ramp 81% 170's-221, 6 burst 84%/6 ramp 81% VF 222bpm w/ATP during charge  I note he has effective pacing of <1% Device check this AM with rep notes stable LV thresholds   2. Hypokalemia     4.3 this AM   3. Chronic CHF 4. ICM     Mild edema on CXR, large R effusion     large R pleural effusion s/p thora yesterday (2073ml), small apical ptx (?)     Discussed with Dr. Lovena Le, no resp distress, follow CXR in AM     Cumulatively fluid neg -1598, continue home diuretics     OptiVol looks OK, does not suggest he is volume OL     Off O2 this AM  5. AFib is described as permanent     On Eliquis at home     Not ordered here >> resume today     Calc CrCl using today's weight/creat is 25   6. AKI on CKI      Nonoliguric ATN suspect 2/2 prolonged VT     Dec 5, was 2.6 (previously baseline looks about 1.5) >> this admission 2.75 > 3.45 > 3.48 today     Getting home metolazone and torsemide     anticipate this will improve, follow daily   7. CAD      No agninal complaints      HS not c/w ACS      On BB and nitrate at home, hold both for some low BPs      follow   8. Thrombocytopenia      This has been mentioned for him in the past  9. Anemia     H/H about where he has been it seems     Gets B12 infusions outpatient     Plts better today 106  For questions or updates, please contact [place] Please consult [email or web address] for contact info under     Signed, Baldwin [place], PA-C  01/25/2020, 8:13 AM    EP Attending  Patient seen and examined. Agree with the findings as noted above. He has not had more VT overnight on IV amiodarone. He has a host of medical problems as noted above. His exam demonstrates a stable but chronically ill appearing man. He does not appear to be volume overloaded. We will transition from IV to  oral amiodarone today 200 mg bid. I would watch him in the hospital until Sunday. His renal functionis stage 4. He clearly had some ATN in the setting of prolonged VT. Hopefully this will improve over the next few days.   Mikle Bosworth.D.

## 2020-01-25 NOTE — Progress Notes (Signed)
NAME:  Rick Gilmore, MRN:  [id number], DOB:  1941-11-13, LOS: 2 ADMISSION DATE:  01/23/2020, CONSULTATION DATE:  01/24/20 REFERRING MD:  Lovena Le, CHIEF COMPLAINT:  VT   Brief History   79 year old man with ischemic cardiomyopathy (post CABG and PCI) w/ AICD in place, prior VT and ablation, cirrhosis w/ portal HTN presenting with Vtach storm found to have pleural effusion for which pulmonology is consulted.  Past Medical History  Ischemic cardiomyopathy, prior VT post ablation with AICD in place Permanent afib OSA on CPAP COPD HTN HLD Prostate cancer  Significant Hospital Events   2/17 admit 2/18 Rt thoracentesis, possible small PTX after  Consults:  EP, Cardiology  Procedures:    Significant Diagnostic Tests:  Echo 2/18 >> EF 25%, grade 3 DD, RVSP 72.5 mmHg, severe MR Rt thoracentesis 2/18 >> protein < 3, LDH 29  Micro Data:  Rt pleural fluid 2/18 >>   Antimicrobials:    Interim history/subjective:  Breathing better after thoracentesis.  Objective   Blood pressure (!) 78/57, pulse 70, temperature 97.8 F (36.6 C), temperature source Oral, resp. rate (!) 28, height 6\' 2"  (1.88 m), weight 99.6 kg, SpO2 93 %.    FiO2 (%):  [21 %] 21 %   Intake/Output Summary (Last 24 hours) at 01/25/2020 0851 Last data filed at 01/25/2020 0700 Gross per 24 hour  Intake 867.76 ml  Output 2600 ml  Net -1732.24 ml   Filed Weights   01/23/20 2316 01/24/20 0016 01/25/20 0530  Weight: 100.1 kg 100.1 kg 99.6 kg    Examination:  General - alert Eyes - pupils reactive ENT - no sinus tenderness, no stridor Cardiac - regular rate/rhythm, 2/6 SM Chest - equal breath sounds b/l, no wheezing or rales Abdomen - soft, non tender, + bowel sounds Extremities - 1+ edema Skin - no rashes Neuro - normal strength, moves extremities, follows commands  Resolved Hospital Problem list     Assessment & Plan:   Transudate Rt pleural effusion. - in setting of cirrhosis and CHF - repeat  thoracentesis as needed if respiratory symptoms progress - f/u pleural fluid cytology and culture from 2/18  Possible right pneumothorax after thoracentesis 2/18. - f/u portable CXR  COPD with asthma. - using breo and incruse as inpatient - continue singulair - can transition to trelegy as outpt  Hx of OSA. - CPAP qhs  VT. Chronic combined CHF with ischemic cardiomyopathy. Permanent A fib. Hx of CAD. - per cardiology  CKD 3b. - f/u BMET  Anemia of critical illness and chronic disease. Thrombocytopenia in setting of cirrhosis. - f/u CBC - transfuse for Hb < 7 or significant bleeding   Labs    CMP Latest Ref Rng & Units 01/25/2020 01/24/2020 01/24/2020  Glucose 70 - 99 mg/dL 128(H) - 136(H)  BUN 8 - 23 mg/dL 56(H) - 55(H)  Creatinine 0.61 - 1.24 mg/dL 3.48(H) - 3.45(H)  Sodium 135 - 145 mmol/L 136 - 135  Potassium 3.5 - 5.1 mmol/L 4.3 4.1 3.0(L)  Chloride 98 - 111 mmol/L 95(L) - 93(L)  CO2 22 - 32 mmol/L 26 - 27  Calcium 8.9 - 10.3 mg/dL 9.0 - 8.6(L)  Total Protein 6.5 - 8.1 g/dL - - -  Total Bilirubin 0.3 - 1.2 mg/dL - - -  Alkaline Phos 38 - 126 U/L - - -  AST 15 - 41 U/L - - -  ALT 0 - 44 U/L - - -   CBC Latest Ref Rng & Units 01/25/2020 01/24/2020  01/23/2020  WBC 4.0 - 10.5 K/uL 8.1 6.9 9.4  Hemoglobin 13.0 - 17.0 g/dL 8.5(L) 8.1(L) 9.1(L)  Hematocrit 39.0 - 52.0 % 26.9(L) 25.5(L) 29.6(L)  Platelets 150 - 400 K/uL 106(L) 89(L) 135(L)    CBG (last 3)  Recent Labs    01/23/20 2347  GLUCAP [place], MD [facility] Pulmonary/Critical Care 01/25/2020, 8:57 AM

## 2020-01-25 NOTE — Progress Notes (Signed)
Patient's daughter called to update on findings of pneumothorax, chest tube placement and plan of care moving forward.  Reviewed prior procedure, no indications of difficulties during procedure.  Questions answered.  Daughter planning to visit this afternoon.    Plan Follow serial CXR's CT care per protocol  CT to -20 cm suction for now, lung mostly inflated post CT insertion, small apical residual PTX.  Note ~ 970ml drained with chest tube insertion   Noe Gens, MSN, NP-C [facility] 01/25/2020, 11:00 AM   Please see [email or web address] for pager details.

## 2020-01-25 NOTE — Consult Note (Signed)
[place] Nurse Consult Note: Patient receiving care in [facility] 2H08.  I spoke with his primary RN, Gae Bon, via telephone. Reason for Consult: skin tear to RFA Wound type: Trauma injury approximately 1 week ago from a fall Pressure Injury POA: Yes/No/NA Measurement: Wound bed: Drainage (amount, consistency, odor) bleeding from site Periwound:  Dressing procedure/placement/frequency: Apply multiple vaseline gauzes to the RFA skin tear, top with ABD pad, secure with Kerlex. Monitor the wound area(s) for worsening of condition such as: Signs/symptoms of infection,  Increase in size,  Development of or worsening of odor, Development of pain, or increased pain at the affected locations.  Notify the medical team if any of these develop.  Thank you for the consult.  Discussed plan of care with the bedside nurse.  [place] nurse will not follow at this time.  Please re-consult the [place] team if needed.  Val Riles, RN, MSN, CWOCN, CNS-BC, pager [phone]

## 2020-01-25 NOTE — Progress Notes (Signed)
Change in chest x-ray noted by radiology. MD notified.

## 2020-01-25 NOTE — Procedures (Signed)
Chest Tube Insertion Procedure Note  Indications:  Clinically significant Pneumothorax on the Right  Pre-operative Diagnosis: Pneumothorax on the Right  Post-operative Diagnosis: Pneumothorax on the Right  Procedure Details  Informed consent was obtained for the procedure, including sedation.  Risks of lung perforation, hemorrhage, arrhythmia, and adverse drug reaction were discussed.   After sterile skin prep, using standard technique, a 14 French tube was placed in the right lateral 5th rib space.  Findings: 50 ml of serous fluid obtained  Estimated Blood Loss:  Minimal         Specimens:  None              Complications:  None; patient tolerated the procedure well.         Disposition: ICU - extubated and stable.         Condition: stable   Georgann Housekeeper, AGACNP-BC [place]  See Amion for personal pager PCCM on call pager [phone]  01/25/2020 10:33 AM

## 2020-01-26 ENCOUNTER — Inpatient Hospital Stay (HOSPITAL_COMMUNITY): Payer: Medicare Other

## 2020-01-26 DIAGNOSIS — I255 Ischemic cardiomyopathy: Secondary | ICD-10-CM

## 2020-01-26 DIAGNOSIS — I428 Other cardiomyopathies: Secondary | ICD-10-CM

## 2020-01-26 DIAGNOSIS — N183 Chronic kidney disease, stage 3 unspecified: Secondary | ICD-10-CM

## 2020-01-26 LAB — CBC
HCT: 27 % — ABNORMAL LOW (ref 39.0–52.0)
Hemoglobin: 8.5 g/dL — ABNORMAL LOW (ref 13.0–17.0)
MCH: 29.1 pg (ref 26.0–34.0)
MCHC: 31.5 g/dL (ref 30.0–36.0)
MCV: 92.5 fL (ref 80.0–100.0)
Platelets: 92 10*3/uL — ABNORMAL LOW (ref 150–400)
RBC: 2.92 MIL/uL — ABNORMAL LOW (ref 4.22–5.81)
RDW: 17.2 % — ABNORMAL HIGH (ref 11.5–15.5)
WBC: 7.2 10*3/uL (ref 4.0–10.5)
nRBC: 0 % (ref 0.0–0.2)

## 2020-01-26 LAB — BASIC METABOLIC PANEL
Anion gap: 14 (ref 5–15)
BUN: 58 mg/dL — ABNORMAL HIGH (ref 8–23)
CO2: 28 mmol/L (ref 22–32)
Calcium: 9 mg/dL (ref 8.9–10.3)
Chloride: 92 mmol/L — ABNORMAL LOW (ref 98–111)
Creatinine, Ser: 3.65 mg/dL — ABNORMAL HIGH (ref 0.61–1.24)
GFR calc Af Amer: 17 mL/min — ABNORMAL LOW (ref >60)
GFR calc non Af Amer: 15 mL/min — ABNORMAL LOW (ref >60)
Glucose, Bld: 119 mg/dL — ABNORMAL HIGH (ref 70–99)
Potassium: 4 mmol/L (ref 3.5–5.1)
Sodium: 134 mmol/L — ABNORMAL LOW (ref 135–145)

## 2020-01-26 MED ORDER — MAGNESIUM OXIDE 400 (241.3 MG) MG PO TABS
400.0000 mg | ORAL_TABLET | Freq: Every day | ORAL | Status: DC
  Administered 2020-01-28 – 2020-02-01 (×3): 400 mg via ORAL
  Filled 2020-01-26 (×4): qty 1

## 2020-01-26 NOTE — Progress Notes (Signed)
RT filled pts humidity chamber with sterile water. Pt able to place himself on his home CPAP without difficulty. RT will continue to monitor.

## 2020-01-26 NOTE — Progress Notes (Signed)
Progress Note  Patient Name: Rick Gilmore Date of Encounter: 01/26/2020  Primary Cardiologist: Dr. Rockey Situ New to Dr. Lovena Le  Patient Profile     79 y.o. 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  Unaware of his valve disease.  Currently without chest pain or shortness of breath but quite tired.  Inpatient Medications    Scheduled Meds: . allopurinol  100 mg Oral Daily  . amiodarone  200 mg Oral BID  . apixaban  5 mg Oral BID  . Chlorhexidine Gluconate Cloth  6 each Topical Daily  . escitalopram  10 mg Oral Daily  . fluticasone furoate-vilanterol  1 puff Inhalation Daily  . folic acid  1 mg Oral Daily  . influenza vaccine adjuvanted  0.5 mL Intramuscular Tomorrow-1000  . magnesium oxide  400 mg Oral BID  . mouth rinse  15 mL Mouth Rinse BID  . metolazone  2.5 mg Oral Once per day on Mon Thu  . montelukast  10 mg Oral QHS  . pantoprazole  40 mg Oral Daily  . pneumococcal 23 valent vaccine  0.5 mL Intramuscular Tomorrow-1000  . potassium chloride SA  20 mEq Oral Once per day on Mon Thu  . pravastatin  80 mg Oral q1800  . Thrombi-Pad  1 each Topical Once  . torsemide  40 mg Oral BID  . umeclidinium bromide  1 puff Inhalation Daily  . vitamin B-12  1,000 mcg Oral q morning - 10a   Continuous Infusions:  PRN  Meds: acetaminophen **OR** acetaminophen, albuterol   Vital Signs    Vitals:   01/26/20 0900 01/26/20 1000 01/26/20 1100 01/26/20 1136  BP: (!) 90/57 (!) 95/59 (!) 86/47   Pulse:      Resp: (!) 22 (!) 29 (!) 28   Temp:    98.4 F (36.9 C)  TempSrc:    Oral  SpO2: 96% 98% 97%   Weight:      Height:        Intake/Output Summary (Last 24 hours) at 01/26/2020 1408 Last data filed at 01/26/2020 1000 Gross per 24 hour  Intake 370 ml  Output 2595 ml  Net -2225 ml   Last 3 Weights 01/25/2020 01/24/2020 01/23/2020  Weight (lbs) 219 lb 9.3 oz 220 lb 10.9 oz 220 lb 10.9 oz  Weight (kg) 99.6 kg 100.1 kg 100.1 kg      Telemetry    V paced - Personally Reviewed  ECG    Ventricular pacing with underlying atrial fibrillation.  Upright QRS lead I, Q RS in lead V1  Physical Exam   Well developed and nourished in no acute distress HENT normal Neck supple with JVP-  10* +HJR Clear Regular rate and rhythm, no murmurs or gallops Abd-soft with active BS No Clubbing cyanosis edema Skin-warm and dry A &  Oriented  Grossly normal sensory and motor function       Labs    High Sensitivity Troponin:   Recent Labs  Lab 01/23/20 1907 01/23/20 2110  TROPONINIHS 24* 31*      Chemistry Recent Labs  Lab 01/23/20 1907 01/23/20 1907 01/24/20 0304 01/24/20 0304 01/24/20 1048 01/25/20 0232 01/26/20 0219  NA 136   < > 135  --   --  136 134*  K 3.2*   < > 3.0*   < > 4.1 4.3 4.0  CL 103   < > 93*  --   --  95* 92*  CO2 19*   < > 27  --   --  26 28  GLUCOSE 152*   < > 136*  --   --  128* 119*  BUN 49*   < > 55*  --   --  56* 58*  CREATININE 2.75*   < > 3.45*  --   --  3.48* 3.65*  CALCIUM 7.5*   < > 8.6*  --   --  9.0 9.0  PROT 5.7*  --   --   --   --   --   --   ALBUMIN 3.3*  --   --   --   --   --   --   AST 27  --   --   --   --   --   --   ALT 9  --   --   --   --   --   --   ALKPHOS 56  --   --   --   --   --   --   BILITOT 1.5*  --   --   --   --   --   --   GFRNONAA 21*    < > 16*  --   --  16* 15*  GFRAA 24*   < > 19*  --   --  18* 17*  ANIONGAP 14   < > 15  --   --  15 14   < > = values in this interval not displayed.     Hematology Recent Labs  Lab 01/24/20 0304 01/25/20 0232 01/26/20 0219  WBC 6.9 8.1 7.2  RBC 2.74* 2.89* 2.92*  HGB 8.1* 8.5* 8.5*  HCT 25.5* 26.9* 27.0*  MCV 93.1 93.1 92.5  MCH 29.6 29.4 29.1  MCHC 31.8 31.6 31.5  RDW 17.2* 17.2* 17.2*  PLT 89* 106* 92*    BNP Recent Labs  Lab 01/23/20 1907  BNP 1,070.0*     DDimer No results for input(s): DDIMER in the last 168 hours.   Radiology    DG Chest 1 View  Result Date: 01/24/2020 CLINICAL DATA:  Status post right-sided thoracentesis EXAM: CHEST  1 VIEW COMPARISON:  01/23/2020 FINDINGS: Prior CABG. Right-sided implanted cardiac device. Stable cardiomegaly. Decreased right-sided pleural effusion. Right costophrenic angle was not included within the field of view. Small right apical pneumothorax (less than 5% lung volume). Streaky left basilar opacity. No left-sided pleural effusion. IMPRESSION: 1. Small right apical pneumothorax (less than 5% lung volume). 2. Decreased right-sided pleural effusion status post thoracentesis. These results will be called to the ordering clinician or representative by the Radiologist Assistant, and communication documented in the PACS or zVision Dashboard. Electronically Signed   By: Davina Poke D.O.   On: 01/24/2020 15:30   CT Head Wo Contrast  Result Date: 01/23/2020 CLINICAL DATA:  79 year old male with fall. EXAM: CT HEAD WITHOUT CONTRAST TECHNIQUE: Contiguous axial images were obtained from the base of the skull through the vertex without intravenous contrast. COMPARISON:  None. FINDINGS: Brain: There is mild age-related atrophy and chronic microvascular ischemic changes. There is no acute intracranial hemorrhage. No mass effect or midline shift. No extra-axial fluid collection. Vascular: No hyperdense vessel or unexpected calcification.  Skull: Normal. Negative for fracture or focal lesion. Sinuses/Orbits: There is partial opacification of the left sphenoid sinus. The remainder of the visualized paranasal sinuses and mastoid air cells are clear. Probable partial right mastoidectomy. Clinical correlation is recommended. Other: Air within the vasculature in the left side of the face, likely iatrogenic. IMPRESSION: 1. No acute intracranial pathology. 2. Mild age-related atrophy and chronic microvascular ischemic changes. Electronically Signed   By: Anner Crete M.D.   On: 01/23/2020 21:46   DG Chest Port 1 View  Result Date: 01/23/2020 CLINICAL DATA:  79 year old with an indwelling pacing defibrillator who presents from home with syncope and an episode of ventricular tachycardia. Hypotension upon EMS arrival. Patient received an iron infusion today. EXAM: PORTABLE CHEST 1 VIEW COMPARISON:  10/27/2019. FINDINGS: Prior sternotomy for CABG. Cardiac silhouette markedly enlarged, unchanged. Interval removal of the previous LEFT pacing subclavian defibrillator and placement of a new RIGHT subclavian pacing defibrillator. External pacing pads are present. Pulmonary venous hypertension and minimal interstitial pulmonary edema. Moderately large RIGHT pleural effusion and associated consolidation in the RIGHT LOWER LOBE. No visible LEFT pleural effusion. IMPRESSION: 1. Stable marked cardiomegaly. Pulmonary venous hypertension and minimal interstitial pulmonary edema. 2. Moderately large RIGHT pleural effusion and associated passive atelectasis and/or pneumonia in the RIGHT LOWER LOBE. Electronically Signed   By: Evangeline Dakin M.D.   On: 01/23/2020 19:38     Cardiac Studies    01/24/2020: TTE IMPRESSIONS  1. Left ventricular ejection fraction, by estimation, is 25%. The left  ventricle has severely decreased function. The left ventricle demonstrates  regional wall motion abnormalities . Akinesis of the basal to mid inferior  wall,  inferolateral wall, and  anterolateral wall. The remainder of the LV was hypokinetic. The left  ventricular internal cavity size was moderately dilated. Left ventricular  diastolic parameters are consistent with Grade III diastolic dysfunction  (restrictive).   2. Right ventricular systolic function is moderately reduced. The right  ventricular size is normal. There is severely elevated pulmonary artery  systolic pressure. The estimated right ventricular systolic pressure is  16.1 mmHg.   3. Left atrial size was severely dilated.   4. Right atrial size was severely dilated.   5. The mitral valve is abnormal. Severe mitral valve regurgitation. The  posterior leaflet appears restricted in setting of inferior and  inferolateral akinesis. Suspect infarct-related MR. This patient may be a  Mitraclip candidate. No evidence of mitral  stenosis.   6. Tricuspid valve regurgitation is moderate.   7. The aortic valve is tricuspid. Aortic valve regurgitation is not  visualized. No aortic stenosis is present.   8. The inferior vena cava is dilated in size with <50% respiratory  variability, suggesting right atrial pressure of 15 mmHg.        Device information MDT CRT-P (no A lead) Implanted 10/31/2019 [place] transmission yesterday 14 monitored VT episodes No treated episodes Presenting rhythm VS 142bpm One EGM appears to have variable morphology to V rhythm His 1st therapy zone is VT 176bpm   Assessment & Plan    VT recurrent  S/p ablation and on amio  IV>>PO     Device programming changes (I have discussed with MDT rep) Therapy zones 130 -170's 6burst 84%/6 ramp 81% 170's-221, 6 burst 84%/6 ramp 81% VF 222bpm w/ATP during charge  CRT-D  Chronic CHF Biventricular w  large R pleural effusion s/p thora yesterday (2070ml), cx by small apical ptx (?)   Ischemic Cardiomyopathy  Anemia/thrombocytopenia   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  Continue amiodarone.  LV lead currently functioning.  Do not know whether is a role for ECG optimization.  Cardiac output is limited.  Capillary refill is down.  Creatinine is up.  Have asked Dr. DM to weigh in.  He is asked [place] to place a central line and to measure CVP and in the a.m. and Co-ox  His valvular issues aggravating his hypotension.  I do not know whether there is a way to mitigate his hypotension so as to improve renal perfusion.  Discussed these issues with him and his separately with his daughter, apprising them of the concern that I have that there are increasingly few options and is 32-year plus, with heart disease.  I suspect with his renal insufficiency that he would not be a candidate for advanced therapies.  I do not know if he might be a candidate for MitraClip.  His right heart failure is a separate aggravating factor  Duration with patient and family 62 min        Virl Axe, MD  01/26/2020, 2:08 PM

## 2020-01-26 NOTE — Progress Notes (Signed)
NAME:  Rick Gilmore, MRN:  [id number], DOB:  October 22, 1941, LOS: 3 ADMISSION DATE:  01/23/2020, CONSULTATION DATE:  01/24/20 REFERRING MD:  Lovena Le, CHIEF COMPLAINT:  VT   Brief History   79 year old man with ischemic cardiomyopathy (post CABG and PCI) w/ AICD in place, prior VT and ablation, cirrhosis w/ portal HTN presenting with Vtach storm found to have pleural effusion for which pulmonology is consulted.  Past Medical History  Ischemic cardiomyopathy, prior VT post ablation with AICD in place Permanent afib OSA on CPAP COPD HTN HLD Prostate cancer  Significant Hospital Events   2/17 admit 2/18 Rt thoracentesis, possible small PTX after  Consults:  EP, Cardiology  Procedures:  2/19 CT placement  Significant Diagnostic Tests:  Echo 2/18 >> EF 25%, grade 3 DD, RVSP 72.5 mmHg, severe MR Rt thoracentesis 2/18 >> protein < 3, LDH 29  Micro Data:  Rt pleural fluid 2/18 >>   Antimicrobials:    Interim history/subjective:  Reports feeling much better this morning. No acute events o/n. Tolerated CPAP.   Objective   Blood pressure (!) 88/54, pulse 70, temperature 98.1 F (36.7 C), temperature source Oral, resp. rate (!) 22, height 6\' 2"  (1.88 m), weight 99.6 kg, SpO2 96 %.        Intake/Output Summary (Last 24 hours) at 01/26/2020 0835 Last data filed at 01/26/2020 0600 Gross per 24 hour  Intake 436.24 ml  Output 2645 ml  Net -2208.76 ml   Filed Weights   01/23/20 2316 01/24/20 0016 01/25/20 0530  Weight: 100.1 kg 100.1 kg 99.6 kg    Examination:  General - alert,  Eyes - pupils reactive, sclera anicteric ENT - no sinus tenderness, no stridor Cardiac - regular rate/rhythm, 2/6 SM Chest - equal breath sounds b/l, no wheezing or rales Abdomen - soft, non tender, + bowel sounds Extremities - trace edema; kerlex on arms over skin breakdown/wounds Skin - no rashes Neuro - normal strength, moves extremities, follows commands  Resolved Hospital Problem list      Assessment & Plan:   Transudate Rt pleural effusion. - in setting of cirrhosis and CHF - repeat thoracentesis as needed if respiratory symptoms progress - f/u pleural fluid cytology and culture from 2/18; no cells on cytology, cx Pending  Possible right pneumothorax after thoracentesis 2/18. - CXR 2/20 improved, resid ~5% PTX. CT to 20 cm, no air leak.  PLAN: monitor output over next 4 hours. If accumulation reasonable, consider CT to H2O seal and repeat CXR 4 hours later.   COPD with asthma. - using breo and incruse as inpatient - continue singulair - can transition to trelegy as outpt  Hx of OSA. Tolerated CPAP o/n w/o problem - CPAP qhs  VT. Chronic combined CHF with ischemic cardiomyopathy. Permanent A fib. Hx of CAD. - per cardiology  CKD 3b. - f/u BMET  Anemia of critical illness and chronic disease. Thrombocytopenia in setting of cirrhosis. - f/u CBC - transfuse for Hb < 7 or significant bleeding   Labs    CMP Latest Ref Rng & Units 01/26/2020 01/25/2020 01/24/2020  Glucose 70 - 99 mg/dL 119(H) 128(H) -  BUN 8 - 23 mg/dL 58(H) 56(H) -  Creatinine 0.61 - 1.24 mg/dL 3.65(H) 3.48(H) -  Sodium 135 - 145 mmol/L 134(L) 136 -  Potassium 3.5 - 5.1 mmol/L 4.0 4.3 4.1  Chloride 98 - 111 mmol/L 92(L) 95(L) -  CO2 22 - 32 mmol/L 28 26 -  Calcium 8.9 - 10.3 mg/dL 9.0 9.0 -  Total Protein 6.5 - 8.1 g/dL - - -  Total Bilirubin 0.3 - 1.2 mg/dL - - -  Alkaline Phos 38 - 126 U/L - - -  AST 15 - 41 U/L - - -  ALT 0 - 44 U/L - - -   CBC Latest Ref Rng & Units 01/26/2020 01/25/2020 01/24/2020  WBC 4.0 - 10.5 K/uL 7.2 8.1 6.9  Hemoglobin 13.0 - 17.0 g/dL 8.5(L) 8.5(L) 8.1(L)  Hematocrit 39.0 - 52.0 % 27.0(L) 26.9(L) 25.5(L)  Platelets 150 - 400 K/uL 92(L) 106(L) 89(L)    CBG (last 3)  Recent Labs    01/23/20 2347  GLUCAP 163*    I have independently seen and examined the patient, reviewed data, and developed an assessment and plan. A total of 33 minutes were spent in  critical care assessment and medical decision making. This critical care time does not reflect procedure time, or teaching time or supervisory time of PA/NP/Med student/Med Resident, etc but could involve care discussion time.  Bonna Gains, MD PhD  01/26/2020, 8:35 AM

## 2020-01-27 ENCOUNTER — Inpatient Hospital Stay (HOSPITAL_COMMUNITY): Payer: Medicare Other

## 2020-01-27 DIAGNOSIS — Z7189 Other specified counseling: Secondary | ICD-10-CM

## 2020-01-27 DIAGNOSIS — Z452 Encounter for adjustment and management of vascular access device: Secondary | ICD-10-CM

## 2020-01-27 DIAGNOSIS — I5043 Acute on chronic combined systolic (congestive) and diastolic (congestive) heart failure: Secondary | ICD-10-CM

## 2020-01-27 LAB — BASIC METABOLIC PANEL
Anion gap: 11 (ref 5–15)
BUN: 62 mg/dL — ABNORMAL HIGH (ref 8–23)
CO2: 28 mmol/L (ref 22–32)
Calcium: 8.7 mg/dL — ABNORMAL LOW (ref 8.9–10.3)
Chloride: 93 mmol/L — ABNORMAL LOW (ref 98–111)
Creatinine, Ser: 3.78 mg/dL — ABNORMAL HIGH (ref 0.61–1.24)
GFR calc Af Amer: 17 mL/min — ABNORMAL LOW (ref >60)
GFR calc non Af Amer: 14 mL/min — ABNORMAL LOW (ref >60)
Glucose, Bld: 126 mg/dL — ABNORMAL HIGH (ref 70–99)
Potassium: 4.2 mmol/L (ref 3.5–5.1)
Sodium: 132 mmol/L — ABNORMAL LOW (ref 135–145)

## 2020-01-27 LAB — CBC
HCT: 24.6 % — ABNORMAL LOW (ref 39.0–52.0)
Hemoglobin: 7.9 g/dL — ABNORMAL LOW (ref 13.0–17.0)
MCH: 29.6 pg (ref 26.0–34.0)
MCHC: 32.1 g/dL (ref 30.0–36.0)
MCV: 92.1 fL (ref 80.0–100.0)
Platelets: 80 10*3/uL — ABNORMAL LOW (ref 150–400)
RBC: 2.67 MIL/uL — ABNORMAL LOW (ref 4.22–5.81)
RDW: 17.1 % — ABNORMAL HIGH (ref 11.5–15.5)
WBC: 6.6 10*3/uL (ref 4.0–10.5)
nRBC: 0 % (ref 0.0–0.2)

## 2020-01-27 LAB — COOXEMETRY PANEL
Carboxyhemoglobin: 2 % — ABNORMAL HIGH (ref 0.5–1.5)
Methemoglobin: 1.1 % (ref 0.0–1.5)
O2 Saturation: 60.8 %
Total hemoglobin: 8 g/dL — ABNORMAL LOW (ref 12.0–16.0)

## 2020-01-27 LAB — BODY FLUID CULTURE
Culture: NO GROWTH
Gram Stain: NONE SEEN

## 2020-01-27 MED ORDER — NOREPINEPHRINE 4 MG/250ML-% IV SOLN
0.0000 ug/min | INTRAVENOUS | Status: DC
  Administered 2020-01-27: 13:00:00 2 ug/min via INTRAVENOUS
  Administered 2020-01-28: 8 ug/min via INTRAVENOUS
  Administered 2020-01-28: 10 ug/min via INTRAVENOUS
  Administered 2020-01-29: 2 ug/min via INTRAVENOUS
  Filled 2020-01-27 (×5): qty 250

## 2020-01-27 MED ORDER — AMIODARONE HCL IN DEXTROSE 360-4.14 MG/200ML-% IV SOLN
30.0000 mg/h | INTRAVENOUS | Status: DC
  Administered 2020-01-27 – 2020-01-28 (×3): 30 mg/h via INTRAVENOUS
  Filled 2020-01-27 (×2): qty 200

## 2020-01-27 MED ORDER — MILRINONE LACTATE IN DEXTROSE 20-5 MG/100ML-% IV SOLN
0.2500 ug/kg/min | INTRAVENOUS | Status: DC
  Administered 2020-01-27 – 2020-01-28 (×2): 0.25 ug/kg/min via INTRAVENOUS
  Filled 2020-01-27: qty 100

## 2020-01-27 MED ORDER — MIDAZOLAM HCL 2 MG/2ML IJ SOLN: 2.0000 mg | Freq: Once | INTRAMUSCULAR | Status: AC

## 2020-01-27 MED ORDER — AMIODARONE HCL IN DEXTROSE 360-4.14 MG/200ML-% IV SOLN: 30.0000 mg/h | INTRAVENOUS | Status: DC

## 2020-01-27 MED ORDER — SODIUM CHLORIDE 0.9% FLUSH
10.0000 mL | Freq: Two times a day (BID) | INTRAVENOUS | Status: DC
  Administered 2020-01-27 – 2020-02-01 (×8): 10 mL

## 2020-01-27 MED ORDER — DIPHENHYDRAMINE HCL 25 MG PO CAPS
25.0000 mg | ORAL_CAPSULE | Freq: Once | ORAL | Status: AC | PRN
  Administered 2020-01-27: 25 mg via ORAL
  Filled 2020-01-27: qty 1

## 2020-01-27 MED ORDER — FUROSEMIDE 10 MG/ML IJ SOLN
80.0000 mg | Freq: Once | INTRAMUSCULAR | Status: AC
  Administered 2020-01-27: 80 mg via INTRAVENOUS
  Filled 2020-01-27: qty 8

## 2020-01-27 MED ORDER — SODIUM CHLORIDE 0.9% FLUSH: 10.0000 mL | INTRAVENOUS | Status: DC | PRN

## 2020-01-27 MED ORDER — MIDAZOLAM HCL 2 MG/2ML IJ SOLN
INTRAMUSCULAR | Status: AC
  Administered 2020-01-27: 11:00:00 2 mg via INTRAVENOUS
  Filled 2020-01-27: qty 2

## 2020-01-27 NOTE — Progress Notes (Signed)
Nursing Progress note  Brief History:  79 year old male with a ventricular paced ICD presented with Vtach, plural effusions, and worsening heart failure. Chest tube placed on 2/18 for a right sided pneumothorax.    Intake/Output Summary (Last 24 hours) at 01/27/2020 1901 Last data filed at 01/27/2020 1800 Gross per 24 hour  Intake 713.4 ml  Output 890 ml  Net -176.6 ml    . amiodarone 30 mg/hr (01/27/20 1800)  . milrinone 0.25 mcg/kg/min (01/27/20 1800)  . norepinephrine (LEVOPHED) Adult infusion 3 mcg/min (01/27/20 1800)    Today's Vitals   01/27/20 1640 01/27/20 1645 01/27/20 1700 01/27/20 1845  BP:  (!) 87/46 (!) 102/49 (!) 96/42  Pulse:      Resp:  16 14 19   Temp: 97.9 F (36.6 C)     TempSrc: Oral     SpO2:  100% 100% 96%  Weight:      Height:      PainSc:         Sodium  Date/Time Value Ref Range Status  01/27/2020 01:29 AM 132 (L) 135 - 145 mmol/L Final  01/26/2020 02:19 AM 134 (L) 135 - 145 mmol/L Final  12/03/2019 03:03 PM 139 134 - 144 mmol/L Final  03/10/2015 08:51 AM 136 mmol/L Final    Comment:    135-145 NOTE: New Reference Range  02/11/15   11/21/2014 05:28 AM 136 136 - 145 mmol/L Final   Potassium  Date/Time Value Ref Range Status  01/27/2020 01:29 AM 4.2 3.5 - 5.1 mmol/L Final  01/26/2020 02:19 AM 4.0 3.5 - 5.1 mmol/L Final  03/10/2015 08:51 AM 4.5 mmol/L Final    Comment:    3.5-5.1 NOTE: New Reference Range  02/11/15   11/23/2014 04:16 AM 3.4 (L) 3.5 - 5.1 mmol/L Final   Chloride  Date/Time Value Ref Range Status  01/27/2020 01:29 AM 93 (L) 98 - 111 mmol/L Final  01/26/2020 02:19 AM 92 (L) 98 - 111 mmol/L Final  03/10/2015 08:51 AM 107 mmol/L Final    Comment:    101-111 NOTE: New Reference Range  02/11/15   11/21/2014 05:28 AM 99 98 - 107 mmol/L Final   CO2  Date/Time Value Ref Range Status  01/27/2020 01:29 AM 28 22 - 32 mmol/L Final  01/26/2020 02:19 AM 28 22 - 32 mmol/L Final   Co2  Date/Time Value Ref Range Status   03/10/2015 08:51 AM 22 mmol/L Final    Comment:    22-32 NOTE: New Reference Range  02/11/15   11/21/2014 05:28 AM 31 21 - 32 mmol/L Final   Glucose  Date/Time Value Ref Range Status  03/10/2015 08:51 AM 95 mg/dL Final    Comment:    65-99 NOTE: New Reference Range  02/11/15   11/21/2014 05:28 AM 109 (H) 65 - 99 mg/dL Final   Glucose, Bld  Date/Time Value Ref Range Status  01/27/2020 01:29 AM 126 (H) 70 - 99 mg/dL Final  01/26/2020 02:19 AM 119 (H) 70 - 99 mg/dL Final   BUN  Date/Time Value Ref Range Status  01/27/2020 01:29 AM 62 (H) 8 - 23 mg/dL Final  01/26/2020 02:19 AM 58 (H) 8 - 23 mg/dL Final  12/03/2019 03:03 PM 35 (H) 8 - 27 mg/dL Final  03/10/2015 08:51 AM 28 (H) mg/dL Final    Comment:    6-20 NOTE: New Reference Range  02/11/15   11/23/2014 12:00 AM 17 4 - 21 mg/dL Final  11/21/2014 05:28 AM 17 7 - 18 mg/dL Final  Creatinine  Date/Time Value Ref Range Status  03/10/2015 08:51 AM 1.03 mg/dL Final    Comment:    0.61-1.24 NOTE: New Reference Range  02/11/15   11/23/2014 04:16 AM 0.91 0.60 - 1.30 mg/dL Final   Creat  Date/Time Value Ref Range Status  11/28/2014 12:29 PM 0.84 0.50 - 1.35 mg/dL Final   Creatinine, Ser  Date/Time Value Ref Range Status  01/27/2020 01:29 AM 3.78 (H) 0.61 - 1.24 mg/dL Final  01/26/2020 02:19 AM 3.65 (H) 0.61 - 1.24 mg/dL Final     WBC  Date/Time Value Ref Range Status  01/27/2020 01:29 AM 6.6 4.0 - 10.5 K/uL Final  01/26/2020 02:19 AM 7.2 4.0 - 10.5 K/uL Final   Hemoglobin  Date/Time Value Ref Range Status  01/27/2020 01:29 AM 7.9 (L) 13.0 - 17.0 g/dL Final  01/26/2020 02:19 AM 8.5 (L) 13.0 - 17.0 g/dL Final   HGB  Date/Time Value Ref Range Status  03/10/2015 08:51 AM 10.3 (L) 13.0 - 18.0 g/dL Final  12/16/2014 01:51 PM 11.2 (L) 13.0 - 18.0 g/dL Final   Platelets  Date/Time Value Ref Range Status  01/27/2020 01:29 AM 80 (L) 150 - 400 K/uL Final    Comment:    REPEATED TO VERIFY Immature Platelet  Fraction may be clinically indicated, consider ordering this additional test [id number] CONSISTENT WITH PREVIOUS RESULT   01/26/2020 02:19 AM 92 (L) 150 - 400 K/uL Final    Comment:    REPEATED TO VERIFY Immature Platelet Fraction may be clinically indicated, consider ordering this additional test [id number] CONSISTENT WITH PREVIOUS RESULT    Platelet  Date/Time Value Ref Range Status  03/10/2015 08:51 AM 104 (L) 150 - 440 x10 3/mm 3 Final  12/16/2014 01:51 PM 122 (L) 150 - 440 x10 3/mm  Final      Nursing Plan:  Advanced heart failure team consulted by primary care team.  CHF MD requested CVC placed for Co-ox and CVP readings.  PCCM MD placed triple lumen in the right IJ without complication.  Patient does have low platelet levels and on eliquis so there is some bleeding from the site, MD is aware.  Initial Co-Ox came back at 60.8 with a CVP of 9-12. Per MD patient started on Milrinone, Norepinephrine, and switched from PO amiodarone to IV infusion.  80 IVP lasix given one hour after start of aforementioned infusions. Patient is in good spirits but has stated many times he is just getting tired.  When elaborating on that he states he believes he is running out of medical options to get him better and is looking into hospice care.  Medical teams all aware of this and with consent of patient are pursuing the CVC with infusions and lab checks to see if this will help.  ICD is a [employer] that is ventricular paced and is capturing appropriately.  Hemodynamics stable with adequate urine output after IVP lasix. Right sided pneumothorax treated with a cook chest tube that was placed 01/24/20.  Tube draining appropriately, being flushed with 30cc sterile saline twice a shift to maintain patency.

## 2020-01-27 NOTE — Procedures (Signed)
[facility] Procedure Note Trapper Meech [id number] Apr 17, 1941  Procedure: R IJ central venous catheter placement, triple lumen; u/s guidance Indications: pressure monitoring, need for frequent labs including co-oximety  Procedure Details Consent: Risks of procedure as well as the alternatives and risks of each were explained to the (patient/caregiver).  Consent for procedure obtained. Time Out: Verified patient identification, verified procedure, site/side was marked, verified correct patient position, special equipment/implants available, medications/allergies/relevent history reviewed, required imaging and test results available.  Performed  Drugs:   2 mg Versed,  Chloraprep, full body drape, sterile u/s probe cover, sterile gloves and gown, all other barrier precautions. R IJ visualized, 1% lidocaine subcutaneous tissues, single needle entry into R IJ; wire passed easily, no ectopy.   Seldinger placement of TLC catheter at 16 cm, sutured into place, biopatch and sterile dressing applied.   Evaluation Hemodynamic Status: BP stable throughout; O2 sats: stable throughout Patient's Current Condition: stable Complications: No apparent complications Patient did tolerate procedure well. Chest X-ray ordered to verify placement.  CXR: in adequate postion ~ 1cm cephalad to RA.  Bonna Gains MD PhD 11:56 AM

## 2020-01-27 NOTE — Progress Notes (Addendum)
Patient ID: Rick Gilmore, male   DOB: [birth year], 79 y.o.   MRN: [id number]    Advanced Heart Failure Team Consult Note   Primary Physician: Crecencio Mc, MD PCP-Cardiologist:  No primary care provider on file.  Reason for Consultation: CHF  HPI:    [name] is seen today for evaluation of CHF at the request of Dr. Caryl Comes.   79 y.o. with history of CAD s/p CABG, ischemic CMP, VT, permanent atrial fibrillation, COPD/asthma, prostate cancer, cirrhosis/NASH was admitted initially after syncopal episode.   Patient had CABG in 1988, he had PCI to SVG-OM1 in 2003.  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  He has a Medtronic CRT-D device.  He had an Enterococcal device infection in 10/20, leads extracted and device later replaced.  He has had trouble with VT, has been on amiodarone and had VT ablation at [facility] in 9/20. Baseline CKD stage 3, creatinine around 2.3.  He is in permanent atrial fibrillation.   He was admitted 2/17 after syncopal episode, found to have VT with rate below detection threshold.  Initially failed DCCV but eventually converted on IV amiodarone.  He developed AKI, creatinine now up to 3.78.  Poor UOP.  SBP in 80s-90s.  He had right thoracentesis (transudate) on 7/82, complicated by small apical PTX and now has chest tube. He has been getting his home diuretic regimen of torsemide 40 mg bid.   Today, he says he feels "tired."  Overall just feels bad.  Not short of breath at rest.  Says that he has been thinking about hospice care but willing to push forwards with further treatment if it could help him feel better.  He has had no further VT.    Review of Systems: All systems reviewed and negative except as per HPI.    Home Medications Prior to Admission medications   Medication Sig Start Date End Date Taking? Authorizing Provider  albuterol (PROVENTIL HFA;VENTOLIN HFA) 108 (90 Base) MCG/ACT inhaler Inhale 2 puffs into the lungs every 6 (six) hours as needed for wheezing or shortness of breath. 12/26/18  Yes Guse, Jacquelynn Cree, FNP  allopurinol (ZYLOPRIM) 100 MG tablet Take 1 tablet (100 mg total) by mouth daily. 08/17/19  Yes Crecencio Mc, MD  amiodarone (PACERONE) 400 MG tablet Take 200 mg by mouth daily.   Yes [provider]  apixaban (ELIQUIS) 5 MG TABS tablet Take 5 mg by mouth 2 (two) times daily.  06/12/19  Yes [provider]  Cyanocobalamin 1000 MCG SUBL Place 1 tablet (1,000 mcg total) under the tongue every morning. 06/15/17  Yes Crecencio Mc, MD  escitalopram (LEXAPRO) 10 MG tablet Take 1 tablet (10 mg total) by mouth daily. 01/22/20  Yes Crecencio Mc, MD  folic acid (FOLVITE) 1 MG tablet Take 1 tablet (1 mg total) by mouth daily. 11/28/19  Yes Lloyd Huger, MD  isosorbide mononitrate (IMDUR) 30 MG 24 hr tablet Take 30 mg by mouth daily.   Yes [provider]  magnesium oxide (MAG-OX) 400 MG tablet Take 1 tablet (400 mg total) by mouth 2 (two) times daily. 01/22/20  Yes Crecencio Mc, MD  metolazone (ZAROXOLYN) 2.5 MG tablet Take 1 tablet (2.5 mg total) by mouth 2 (two) times a week. Hold for blood pressure less than 120/50 12/27/19  03/26/20 Yes Darylene Price A, FNP  metoprolol succinate (TOPROL-XL) 50 MG 24 hr tablet Take 1 tablet (50 mg total) by mouth daily. Take with or immediately following a meal. 01/22/20  Yes Crecencio Mc, MD  montelukast (SINGULAIR) 10 MG tablet Take 1 tablet (10 mg total) by mouth at bedtime. 08/17/19  Yes Crecencio Mc, MD  pantoprazole (PROTONIX) 40 MG tablet Take 1 tablet (40 mg total) by mouth daily. 05/10/19  Yes Crecencio Mc, MD  potassium chloride SA (KLOR-CON) 20 MEQ tablet Take 1 tablet (20 mEq total) by mouth 2 (two) times a  week. Take when you take the metolazone 12/27/19  Yes Darylene Price A, FNP  pravastatin (PRAVACHOL) 80 MG tablet Take 80 mg by mouth daily.   Yes [provider]  torsemide (DEMADEX) 20 MG tablet Take 2 tablets (40 mg total) by mouth 2 (two) times daily. 01/01/20 03/31/20 Yes Gollan, Kathlene November, MD  umeclidinium-vilanterol (ANORO ELLIPTA) 62.5-25 MCG/INH AEPB USE 1 INHALATION DAILY Patient taking differently: Inhale 1 puff into the lungs daily.  01/22/20  Yes Crecencio Mc, MD    Past Medical History: 1. COPD/asthma: Remote smoker.  2. 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  3. CAD: CABG 1988, PCI to SVG-OM1 in 2003.  4. OSA: CPAP 5. CKD: Stage 3.  6. Atrial fibrillation: Permanent.  7. Chronic systolic CHF: Ischemic cardiomyopathy.  Echo 2/21 with EF 25%, basal-mid inferior/inferolateral/anterolateral akinesis, moderate LV dilation, moderately decreased RV systolic function, PASP 73, moderate TR, severe MR (likely infarct-related), dilated IVC.  - Medtronic CRT-D device.  8. ICD infection: Enterococcal.  Leads extracted 10/20 and later replaced.  9. VT: On amiodarone.  9/20 ablation at [facility]  10. Chronic thrombocytopenia.  11. B12 deficiency.  12. H/o prostate cancer.   Past Surgical History: Past Surgical History:  Procedure Laterality Date  . CARDIAC CATHETERIZATION    . COLONOSCOPY WITH PROPOFOL [name] 05/30/2015   Procedure: COLONOSCOPY WITH PROPOFOL;  Surgeon: Manya Silvas, MD;  Location: [facility] ENDOSCOPY;  Service: Endoscopy;  Laterality: [name];  . COLONOSCOPY WITH PROPOFOL [name] 07/16/2019   Procedure: COLONOSCOPY WITH PROPOFOL;  Surgeon: Toledo, Benay Pike, MD;  Location: [facility] ENDOSCOPY;  Service: Gastroenterology;  Laterality: [name];  . CORONARY ARTERY BYPASS GRAFT  1988  . ESOPHAGOGASTRODUODENOSCOPY [name] 05/30/2015   Procedure: ESOPHAGOGASTRODUODENOSCOPY (EGD);  Surgeon: Manya Silvas, MD;  Location: [facility] ENDOSCOPY;  Service: Endoscopy;  Laterality: [name];  . ESOPHAGOGASTRODUODENOSCOPY  (EGD) WITH PROPOFOL [name] 07/16/2019   Procedure: ESOPHAGOGASTRODUODENOSCOPY (EGD) WITH PROPOFOL;  Surgeon: Toledo, Benay Pike, MD;  Location: [facility] ENDOSCOPY;  Service: Gastroenterology;  Laterality: [name];  . IMPLANTABLE CARDIOVERTER DEFIBRILLATOR (ICD) GENERATOR CHANGE Left 10/02/2019   Procedure: ICD GENERATOR CHANGE;  Surgeon: Isaias Cowman, MD;  Location: [facility] ORS;  Service: Cardiovascular;  Laterality: Left;  . IMPLANTABLE CARDIOVERTER DEFIBRILLATOR GENERATOR CHANGE  April 2014   Bi V RRR  . INSERT / REPLACE / REMOVE PACEMAKER  2014  . VASECTOMY      Family History: Family History  Problem Relation Age of Onset  . Hypertension Mother   . Hypertension Father   . Cancer Father        prostate, throat  . Emphysema Father   . Heart disease Maternal Grandmother   . Heart disease Paternal Grandmother     Social History: Social History   Socioeconomic History  . Marital status: Divorced    Spouse name: Not on file  . Number of children: Not on file  . Years of education: Not on file  .  Highest education level: Not on file  Occupational History  . Occupation: Retired  Tobacco Use  . Smoking status: Never Smoker  . Smokeless tobacco: Never Used  Substance and Sexual Activity  . Alcohol use: Yes    Alcohol/week: 4.0 standard drinks    Types: 2 Standard drinks or equivalent, 2 Cans of beer per week    Comment: Mixed drinks  . Drug use: No  . Sexual activity: Never  Other Topics Concern  . Not on file  Social History Narrative  . Not on file   Social Determinants of Health   Financial Resource Strain:   . Difficulty of Paying Living Expenses: Not on file  Food Insecurity:   . Worried About Charity fundraiser in the Last Year: Not on file  . Ran Out of Food in the Last Year: Not on file  Transportation Needs:   . Lack of Transportation (Medical): Not on file  . Lack of Transportation (Non-Medical): Not on file  Physical Activity:   . Days of Exercise per Week:  Not on file  . Minutes of Exercise per Session: Not on file  Stress:   . Feeling of Stress : Not on file  Social Connections:   . Frequency of Communication with Friends and Family: Not on file  . Frequency of Social Gatherings with Friends and Family: Not on file  . Attends Religious Services: Not on file  . Active Member of Clubs or Organizations: Not on file  . Attends Archivist Meetings: Not on file  . Marital Status: Not on file    Allergies:  No Known Allergies  Objective:    Vital Signs:   Temp:  [97.3 F (36.3 C)-98.4 F (36.9 C)] 98 F (36.7 C) (02/21 0740) Pulse Rate:  [70] 70 (02/21 0749) Resp:  [15-70] 33 (02/21 0900) BP: (77-98)/(39-68) 87/68 (02/21 0900) SpO2:  [86 %-100 %] 100 % (02/21 0900) Last BM Date: 01/23/20  Weight change: Filed Weights   01/23/20 2316 01/24/20 0016 01/25/20 0530  Weight: 100.1 kg 100.1 kg 99.6 kg    Intake/Output:   Intake/Output Summary (Last 24 hours) at 01/27/2020 1000 Last data filed at 01/27/2020 0900 Gross per 24 hour  Intake 320 ml  Output 1140 ml  Net -820 ml      Physical Exam    General:  Well appearing. No resp difficulty HEENT: normal Neck: supple. JVP 16 cm. Carotids 2+ bilat; no bruits. No lymphadenopathy or thyromegaly appreciated. Cor: PMI lateral. Regular rate & rhythm. No rubs, gallops. 2/6 HSM apex.  Lungs: clear Abdomen: soft, nontender, nondistended. No hepatosplenomegaly. No bruits or masses. Good bowel sounds. Extremities: no cyanosis, clubbing, rash. 1+ edema to knees Neuro: alert & orientedx3, cranial nerves grossly intact. moves all 4 extremities w/o difficulty. Affect pleasant   Telemetry   Atrial fibrillation with BiV pacing 70s (personally reviewed)  EKG    BiV pacing with underlying atrial fibrillation (personally reviewed)  Labs   Basic Metabolic Panel: Recent Labs  Lab 01/23/20 1907 01/23/20 1907 01/24/20 0304 01/24/20 0304 01/24/20 1048 01/25/20 0232  01/26/20 0219 01/27/20 0129  NA 136  --  135  --   --  136 134* 132*  K 3.2*   < > 3.0*  --  4.1 4.3 4.0 4.2  CL 103  --  93*  --   --  95* 92* 93*  CO2 19*  --  27  --   --  26 28 28   GLUCOSE 152*  --  136*  --   --  128* 119* 126*  BUN 49*  --  55*  --   --  56* 58* 62*  CREATININE 2.75*  --  3.45*  --   --  3.48* 3.65* 3.78*  CALCIUM 7.5*   < > 8.6*   < >  --  9.0 9.0 8.7*  MG 1.9  --  2.4  --   --   --   --   --    < > = values in this interval not displayed.    Liver Function Tests: Recent Labs  Lab 01/23/20 1907  AST 27  ALT 9  ALKPHOS 56  BILITOT 1.5*  PROT 5.7*  ALBUMIN 3.3*   No results for input(s): LIPASE, AMYLASE in the last 168 hours. No results for input(s): AMMONIA in the last 168 hours.  CBC: Recent Labs  Lab 01/23/20 1106 01/23/20 1106 01/23/20 1907 01/24/20 0304 01/25/20 0232 01/26/20 0219 01/27/20 0129  WBC 7.5   < > 9.4 6.9 8.1 7.2 6.6  NEUTROABS 5.8  --  6.9  --   --   --   --   HGB 8.8*   < > 9.1* 8.1* 8.5* 8.5* 7.9*  HCT 29.1*   < > 29.6* 25.5* 26.9* 27.0* 24.6*  MCV 95.7   < > 95.5 93.1 93.1 92.5 92.1  PLT 109*   < > 135* 89* 106* 92* 80*   < > = values in this interval not displayed.    Cardiac Enzymes: No results for input(s): CKTOTAL, CKMB, CKMBINDEX, TROPONINI in the last 168 hours.  BNP: BNP (last 3 results) Recent Labs    09/08/19 1952 09/30/19 1719 01/23/20 1907  BNP 711.0* 1,277.0* 1,070.0*    ProBNP (last 3 results) Recent Labs    08/17/19 1415  PROBNP 1,124.0*     CBG: Recent Labs  Lab 01/23/20 2347  GLUCAP 163*    Coagulation Studies: Recent Labs    01/25/20 0708  LABPROT 20.2*  INR 1.7*     Imaging    No results found.   Medications:     Current Medications: . allopurinol  100 mg Oral Daily  . amiodarone  200 mg Oral BID  . apixaban  5 mg Oral BID  . Chlorhexidine Gluconate Cloth  6 each Topical Daily  . escitalopram  10 mg Oral Daily  . fluticasone furoate-vilanterol  1 puff  Inhalation Daily  . folic acid  1 mg Oral Daily  . influenza vaccine adjuvanted  0.5 mL Intramuscular Tomorrow-1000  . [START ON 01/28/2020] magnesium oxide  400 mg Oral Daily  . mouth rinse  15 mL Mouth Rinse BID  . montelukast  10 mg Oral QHS  . pantoprazole  40 mg Oral Daily  . pneumococcal 23 valent vaccine  0.5 mL Intramuscular Tomorrow-1000  . pravastatin  80 mg Oral q1800  . Thrombi-Pad  1 each Topical Once  . umeclidinium bromide  1 puff Inhalation Daily  . vitamin B-12  1,000 mcg Oral q morning - 10a     Infusions:    Assessment/Plan   1. VT: Suspect scar-mediated, electromechanical.  Associated with syncope.  H/o VT ablation at [facility] 9/20, has been on amiodarone at home.  No evidence for ACS (no chest pain, HS-TnI minimally elevated with no trend).  VT was below detection threshold. No further VT.  - MDT CRT-D device adjusted.  - He was initially on IV amiodarone, now back to po.   - If inotrope  begun, will transition amiodarone back to IV.  2. Acute on chronic systolic CHF: Ischemic cardiomyopathy with MDT CRT-D device.  Echo this admission with EF 25%, basal-mid inferior/inferolateral/anterolateral akinesis, moderate LV dilation, moderately decreased RV systolic function, PASP 73, moderate TR, severe MR (likely infarct-related), dilated IVC. BP has been low (SBP 80s-90s), AKI on CKD, volume overloaded on exam.  He has been on his home diuretic regimen with minimal response. I am concerned for low output HF. - Place CVL today.  - Will send co-ox.  If low as expected, will start milrinone +/- norepinephrine.  - Follow CVP, will diurese after starting inotrope.  - Suspect severe infarct-related MR plays a role in CHF.  - With baseline renal dysfunction, he may not be a candidate for advanced therapies but need to see how he does with hemodynamic optimization.  3. Atrial fibrillation: Permanent per notes.  He is on apixaban.  4. 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  This likely plays a role in CHF.  May be a candidate in future for Mitraclip but needs optimization.   5. AKI on CKD stage 3: Creatinine up to 3.78 from baseline around 2.3. Possible cardiorenal syndrome with low output, but syncope/low BP prior to admit may have led to a degree of ATN.  - CVL today, check co-ox and optimize hemodynamics with inotrope if appropriate.  6. OSA: Continue CPAP at night.  7. CAD: H/o CABG.  Doubt ACS at presentation, low level HS-TnI elevation with no trend.  - No ASA with apixaban use.  - Continue statin.  8. Thrombocytopenia: Stable, chronic.   Length of Stay: [place], MD  01/27/2020, 10:00 AM  Advanced Heart Failure Team Pager [phone] (M-F; 7a - 4p)  Please contact [facility] for night-coverage after hours (4p -7a ) and weekends on [email or web address]  CVL placed, co-ox 60% with CVP 11-12.   I am going to try him on milrinone 0.25 + norepinephrine 2 to see if we can support BP and CO a bit and improve renal function.  Will give Lasix 80 mg IV x 1 after meds started.   Will use IV amiodarone while on inotropes.   Loralie Champagne 01/27/2020 12:12 PM

## 2020-01-27 NOTE — Progress Notes (Signed)
Progress Note  Patient Name: Rick Gilmore Date of Encounter: 01/27/2020  Primary Cardiologist: Dr. Rockey Situ New to Dr. Lovena Le  Patient Profile     79 y.o. 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; anemia -Fe def  Hx of VT ablation 08/27/2019   Transferred to [facility] for refractory VT below his VT detection rate -- at that time noted  K 3.0; Cr 3.45    Hx of prior ICD implanted 2009, gen change 2010, 2014, 10/02/2019 Bacteremia (enterococcal), pocket infection Nov 2020, system extraction 10/30/2019 and R IJ temp PM New system implant (MDT CRT-D) right side 11/05/2019 >> discharged 11/06/2019 Followed at [facility]   Chronically on amiodarone    Subjective   Tired today,  Expressing desire to just go home to hospice  No SOB   Inpatient Medications    Scheduled Meds: . allopurinol  100 mg Oral Daily  . apixaban  5 mg Oral BID  . Chlorhexidine Gluconate Cloth  6 each Topical Daily  . escitalopram  10 mg Oral Daily  . fluticasone furoate-vilanterol  1 puff Inhalation Daily  . folic acid  1 mg Oral Daily  . influenza vaccine adjuvanted  0.5 mL Intramuscular Tomorrow-1000  . [START ON 01/28/2020] magnesium oxide  400 mg Oral Daily  . mouth rinse  15 mL Mouth Rinse BID  . montelukast  10 mg Oral QHS  . pantoprazole  40 mg Oral Daily  . pneumococcal 23 valent vaccine  0.5 mL Intramuscular Tomorrow-1000  . pravastatin  80 mg Oral q1800  . umeclidinium bromide  1 puff Inhalation Daily  . vitamin B-12  1,000 mcg Oral q morning - 10a   Continuous Infusions: . amiodarone 30 mg/hr (01/27/20 1300)  . milrinone 0.25 mcg/kg/min (01/27/20 1300)  . norepinephrine (LEVOPHED) Adult infusion 2 mcg/min (01/27/20 1300)   PRN Meds: acetaminophen **OR** acetaminophen, albuterol   Vital Signs    Vitals:   01/27/20 0900 01/27/20 1000 01/27/20 1204  01/27/20 1300  BP: (!) 87/68 (!) 94/57  (!) 93/50  Pulse:      Resp: (!) 33 (!) 31  (!) 23  Temp:   97.9 F (36.6 C)   TempSrc:   Oral   SpO2: 100% 100%  100%  Weight:      Height:        Intake/Output Summary (Last 24 hours) at 01/27/2020 1420 Last data filed at 01/27/2020 1300 Gross per 24 hour  Intake 517.55 ml  Output 1040 ml  Net -522.45 ml   Last 3 Weights 01/25/2020 01/24/2020 01/23/2020  Weight (lbs) 219 lb 9.3 oz 220 lb 10.9 oz 220 lb 10.9 oz  Weight (kg) 99.6 kg 100.1 kg 100.1 kg      Telemetry     Vpaced  - Personally Reviewed  ECG    Ventricular pacing with underlying atrial fibrillation.  Upright QRS lead I, Q RS in lead V1  Physical Exam   Well developed and nourished in no acute distress; but looks weary HENT normal Neck supple with JVP- 10+HJR Clear Regular rate and rhythm, no murmurs or gallops Abd-soft with active BS No Clubbing cyanosis edema Skin-warm and dry A & Oriented  Grossly normal sensory and motor function           Labs    High Sensitivity Troponin:   Recent Labs  Lab 01/23/20  1907 01/23/20 2110  TROPONINIHS 24* 31*      Chemistry Recent Labs  Lab 01/23/20 1907 01/24/20 0304 01/25/20 0232 01/26/20 0219 01/27/20 0129  NA 136   < > 136 134* 132*  K 3.2*   < > 4.3 4.0 4.2  CL 103   < > 95* 92* 93*  CO2 19*   < > 26 28 28   GLUCOSE 152*   < > 128* 119* 126*  BUN 49*   < > 56* 58* 62*  CREATININE 2.75*   < > 3.48* 3.65* 3.78*  CALCIUM 7.5*   < > 9.0 9.0 8.7*  PROT 5.7*  --   --   --   --   ALBUMIN 3.3*  --   --   --   --   AST 27  --   --   --   --   ALT 9  --   --   --   --   ALKPHOS 56  --   --   --   --   BILITOT 1.5*  --   --   --   --   GFRNONAA 21*   < > 16* 15* 14*  GFRAA 24*   < > 18* 17* 17*  ANIONGAP 14   < > 15 14 11    < > = values in this interval not displayed.     Hematology Recent Labs  Lab 01/25/20 0232 01/26/20 0219 01/27/20 0129  WBC 8.1 7.2 6.6  RBC 2.89* 2.92* 2.67*  HGB 8.5* 8.5*  7.9*  HCT 26.9* 27.0* 24.6*  MCV 93.1 92.5 92.1  MCH 29.4 29.1 29.6  MCHC 31.6 31.5 32.1  RDW 17.2* 17.2* 17.1*  PLT 106* 92* 80*    BNP Recent Labs  Lab 01/23/20 1907  BNP 1,070.0*     DDimer No results for input(s): DDIMER in the last 168 hours.   Radiology    DG Chest 1 View  Result Date: 01/24/2020 CLINICAL DATA:  Status post right-sided thoracentesis EXAM: CHEST  1 VIEW COMPARISON:  01/23/2020 FINDINGS: Prior CABG. Right-sided implanted cardiac device. Stable cardiomegaly. Decreased right-sided pleural effusion. Right costophrenic angle was not included within the field of view. Small right apical pneumothorax (less than 5% lung volume). Streaky left basilar opacity. No left-sided pleural effusion. IMPRESSION: 1. Small right apical pneumothorax (less than 5% lung volume). 2. Decreased right-sided pleural effusion status post thoracentesis. These results will be called to the ordering clinician or representative by the Radiologist Assistant, and communication documented in the PACS or zVision Dashboard. Electronically Signed   By: Davina Poke D.O.   On: 01/24/2020 15:30   CT Head Wo Contrast  Result Date: 01/23/2020 CLINICAL DATA:  79 year old male with fall. EXAM: CT HEAD WITHOUT CONTRAST TECHNIQUE: Contiguous axial images were obtained from the base of the skull through the vertex without intravenous contrast. COMPARISON:  None. FINDINGS: Brain: There is mild age-related atrophy and chronic microvascular ischemic changes. There is no acute intracranial hemorrhage. No mass effect or midline shift. No extra-axial fluid collection. Vascular: No hyperdense vessel or unexpected calcification. Skull: Normal. Negative for fracture or focal lesion. Sinuses/Orbits: There is partial opacification of the left sphenoid sinus. The remainder of the visualized paranasal sinuses and mastoid air cells are clear. Probable partial right mastoidectomy. Clinical correlation is recommended. Other: Air  within the vasculature in the left side of the face, likely iatrogenic. IMPRESSION: 1. No acute intracranial pathology. 2. Mild age-related atrophy and chronic microvascular ischemic  changes. Electronically Signed   By: Anner Crete M.D.   On: 01/23/2020 21:46   DG Chest Port 1 View  Result Date: 01/23/2020 CLINICAL DATA:  79 year old with an indwelling pacing defibrillator who presents from home with syncope and an episode of ventricular tachycardia. Hypotension upon EMS arrival. Patient received an iron infusion today. EXAM: PORTABLE CHEST 1 VIEW COMPARISON:  10/27/2019. FINDINGS: Prior sternotomy for CABG. Cardiac silhouette markedly enlarged, unchanged. Interval removal of the previous LEFT pacing subclavian defibrillator and placement of a new RIGHT subclavian pacing defibrillator. External pacing pads are present. Pulmonary venous hypertension and minimal interstitial pulmonary edema. Moderately large RIGHT pleural effusion and associated consolidation in the RIGHT LOWER LOBE. No visible LEFT pleural effusion. IMPRESSION: 1. Stable marked cardiomegaly. Pulmonary venous hypertension and minimal interstitial pulmonary edema. 2. Moderately large RIGHT pleural effusion and associated passive atelectasis and/or pneumonia in the RIGHT LOWER LOBE. Electronically Signed   By: Evangeline Dakin M.D.   On: 01/23/2020 19:38     Cardiac Studies    01/24/2020: TTE IMPRESSIONS  1. Left ventricular ejection fraction, by estimation, is 25%. The left  ventricle has severely decreased function. The left ventricle demonstrates  regional wall motion abnormalities . Akinesis of the basal to mid inferior  wall, inferolateral wall, and  anterolateral wall. The remainder of the LV was hypokinetic. The left  ventricular internal cavity size was moderately dilated. Left ventricular  diastolic parameters are consistent with Grade III diastolic dysfunction  (restrictive).   2. Right ventricular systolic function is  moderately reduced. The right  ventricular size is normal. There is severely elevated pulmonary artery  systolic pressure. The estimated right ventricular systolic pressure is  75.6 mmHg.   3. Left atrial size was severely dilated.   4. Right atrial size was severely dilated.   5. The mitral valve is abnormal. Severe mitral valve regurgitation. The  posterior leaflet appears restricted in setting of inferior and  inferolateral akinesis. Suspect infarct-related MR. This patient may be a  Mitraclip candidate. No evidence of mitral  stenosis.   6. Tricuspid valve regurgitation is moderate.   7. The aortic valve is tricuspid. Aortic valve regurgitation is not  visualized. No aortic stenosis is present.   8. The inferior vena cava is dilated in size with <50% respiratory  variability, suggesting right atrial pressure of 15 mmHg.        Device information MDT CRT-P (no A lead) Implanted 10/31/2019 [place] transmission yesterday 14 monitored VT episodes No treated episodes Presenting rhythm VS 142bpm One EGM appears to have variable morphology to V rhythm His 1st therapy zone is VT 176bpm   Assessment & Plan    VT recurrent  S/p ablation and on amio  IV>>PO     Device programming changes (I have discussed with MDT rep) Therapy zones 130 -170's 6burst 84%/6 ramp 81% 170's-221, 6 burst 84%/6 ramp 81% VF 222bpm w/ATP during charge  CRT-D  Chronic CHF Biventricular w  large R pleural effusion s/p thora yesterday (2043ml), cx by small apical ptx (?)  Hypotension   Ischemic Cardiomyopathy  Anemia/thrombocytopenia   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 Eval of MitraClip possibilities prior to making a decision re comfort care  It may also be appropriate to engage help regarding the diagnosis and treatment of his despondency/depression prior to making a decisions regarding comfort care;. It is broadly appreciated that depression, if present, detracts from ones ability to make rational/appropriate autonomous decisions  He is agreeable to ongoing testing and treatment for now   Time with patient and daughter 54 min              Virl Axe, MD  01/27/2020, 2:20 PM

## 2020-01-27 NOTE — Progress Notes (Signed)
NAME:  Rick Gilmore, MRN:  [id number], DOB:  05-03-1941, LOS: 4 ADMISSION DATE:  01/23/2020, CONSULTATION DATE:  01/24/20 REFERRING MD:  Lovena Le, CHIEF COMPLAINT:  VT   Brief History   79 year old man with ischemic cardiomyopathy (post CABG and PCI) w/ AICD in place, prior VT and ablation, cirrhosis w/ portal HTN presenting with Vtach storm found to have pleural effusion for which pulmonology is consulted.  Past Medical History  Ischemic cardiomyopathy, prior VT post ablation with AICD in place Permanent afib OSA on CPAP COPD HTN HLD Prostate cancer  Significant Hospital Events   2/17 admit 2/18 Rt thoracentesis, possible small PTX after  Consults:  EP, Cardiology  Procedures:  2/19 CT placement 2/21 R IJ TLC for CVP, cooximetry  Significant Diagnostic Tests:  Echo 2/18 >> EF 25%, grade 3 DD, RVSP 72.5 mmHg, severe MR Rt thoracentesis 2/18 >> protein < 3, LDH 29  Micro Data:  Rt pleural fluid 2/18 >>   Antimicrobials:    Interim history/subjective:  Reports feeling tired this a.m. Has talked to cardiologists about goals of CVP, coox for R heart monitoring and possible treatment options.   Objective   Blood pressure (!) 94/57, pulse 70, temperature 98 F (36.7 C), temperature source Oral, resp. rate (!) 31, height 6\' 2"  (1.88 m), weight 99.6 kg, SpO2 100 %.        Intake/Output Summary (Last 24 hours) at 01/27/2020 1159 Last data filed at 01/27/2020 0900 Gross per 24 hour  Intake 290 ml  Output 1100 ml  Net -810 ml   Filed Weights   01/23/20 2316 01/24/20 0016 01/25/20 0530  Weight: 100.1 kg 100.1 kg 99.6 kg    Examination:  General - alert,  Eyes - pupils reactive, sclera anicteric ENT - no sinus tenderness, no stridor Cardiac - regular rate/rhythm, 2/6 SM Chest - equal breath sounds b/l, no wheezing or rales Abdomen - soft, non tender, + bowel sounds Extremities - trace edema; kerlex on arms over skin breakdown/wounds Skin - no rashes Neuro - normal  strength, moves extremities, A/0 x4, interactive   Resolved Hospital Problem list     Assessment & Plan:   Transudate Rt pleural effusion. - in setting of cirrhosis and CHF - repeat thoracentesis as needed if respiratory symptoms progress - f/u pleural fluid cytology and culture from 2/18; no cells on cytology, cx Pending  Possible right pneumothorax after thoracentesis 2/18. - CXR 2/20 improved, resid ~5% PTX. CT to 20 cm, no air leak.  PLAN: monitor output; continues to have 30-60cc/hr.  COPD with asthma. - using breo and incruse as inpatient - continue singulair - can transition to trelegy as outpt  Hx of OSA. Tolerated CPAP o/n w/o problem - CPAP qhs  VT. Chronic combined CHF with ischemic cardiomyopathy. Permanent A fib. Hx of CAD. - per cardiology -TLC placed for monitoring  CKD 3b. - f/u BMET  Anemia of critical illness and chronic disease. Thrombocytopenia in setting of cirrhosis. - f/u CBC - transfuse for Hb < 7 or significant bleeding   Labs    CMP Latest Ref Rng & Units 01/27/2020 01/26/2020 01/25/2020  Glucose 70 - 99 mg/dL 126(H) 119(H) 128(H)  BUN 8 - 23 mg/dL 62(H) 58(H) 56(H)  Creatinine 0.61 - 1.24 mg/dL 3.78(H) 3.65(H) 3.48(H)  Sodium 135 - 145 mmol/L 132(L) 134(L) 136  Potassium 3.5 - 5.1 mmol/L 4.2 4.0 4.3  Chloride 98 - 111 mmol/L 93(L) 92(L) 95(L)  CO2 22 - 32 mmol/L 28 28 26  Calcium 8.9 - 10.3 mg/dL 8.7(L) 9.0 9.0  Total Protein 6.5 - 8.1 g/dL - - -  Total Bilirubin 0.3 - 1.2 mg/dL - - -  Alkaline Phos 38 - 126 U/L - - -  AST 15 - 41 U/L - - -  ALT 0 - 44 U/L - - -   CBC Latest Ref Rng & Units 01/27/2020 01/26/2020 01/25/2020  WBC 4.0 - 10.5 K/uL 6.6 7.2 8.1  Hemoglobin 13.0 - 17.0 g/dL 7.9(L) 8.5(L) 8.5(L)  Hematocrit 39.0 - 52.0 % 24.6(L) 27.0(L) 26.9(L)  Platelets 150 - 400 K/uL 80(L) 92(L) 106(L)    CBG (last 3)  No results for input(s): GLUCAP in the last 72 hours.  I have independently seen and examined the patient, reviewed  data, and developed an assessment and plan. A total of 39 minutes were spent in critical care assessment and medical decision making. This critical care time does not reflect procedure time, or teaching time or supervisory time of PA/NP/Med student/Med Resident, etc but could involve care discussion time.  Bonna Gains, MD PhD  01/27/2020, 12:02 PM

## 2020-01-27 NOTE — Progress Notes (Signed)
[place] Progress Note Patient Name: Rick Gilmore DOB: 03/28/1941 MRN: [id number]   Date of Service  01/27/2020  HPI/Events of Note  Request for increase the maximum dose of norepinephrine which is currently set at 4. Current BP 90/46 (MAP 59)  eICU Interventions  Patient now has a central line and will increase maximum norepinephrine to allowable dose. Bedside RN to inform Elink with significant increase in requirement     Intervention Category Major Interventions: Shock - evaluation and management  Judd Lien 01/27/2020, 10:55 PM

## 2020-01-27 NOTE — Progress Notes (Signed)
[place] Progress Note Patient Name: Dierre Crevier DOB: [birth year] MRN: [id number]   Date of Service  01/27/2020  HPI/Events of Note  Patient unable to sleep and reportedly is anxious.   eICU Interventions  Ordered Benadryl 25 mg PO. Avoiding benzos given history of cirrhosis and OSA     Intervention Category Minor Interventions: Routine modifications to care plan (e.g. PRN medications for pain, fever)  Shona Needles Khalise Billard 01/27/2020, 9:04 PM

## 2020-01-27 NOTE — Progress Notes (Signed)
Pt has his home CPAP machine and is able to place himself on without difficulty. RT will continue to monitor.

## 2020-01-28 DIAGNOSIS — R570 Cardiogenic shock: Secondary | ICD-10-CM

## 2020-01-28 DIAGNOSIS — N179 Acute kidney failure, unspecified: Secondary | ICD-10-CM

## 2020-01-28 DIAGNOSIS — Z515 Encounter for palliative care: Secondary | ICD-10-CM

## 2020-01-28 DIAGNOSIS — I5040 Unspecified combined systolic (congestive) and diastolic (congestive) heart failure: Secondary | ICD-10-CM

## 2020-01-28 DIAGNOSIS — J95811 Postprocedural pneumothorax: Secondary | ICD-10-CM

## 2020-01-28 LAB — BASIC METABOLIC PANEL WITH GFR
Anion gap: 15 (ref 5–15)
BUN: 66 mg/dL — ABNORMAL HIGH (ref 8–23)
CO2: 25 mmol/L (ref 22–32)
Calcium: 8.5 mg/dL — ABNORMAL LOW (ref 8.9–10.3)
Chloride: 91 mmol/L — ABNORMAL LOW (ref 98–111)
Creatinine, Ser: 4.05 mg/dL — ABNORMAL HIGH (ref 0.61–1.24)
GFR calc Af Amer: 15 mL/min — ABNORMAL LOW
GFR calc non Af Amer: 13 mL/min — ABNORMAL LOW
Glucose, Bld: 129 mg/dL — ABNORMAL HIGH (ref 70–99)
Potassium: 3.8 mmol/L (ref 3.5–5.1)
Sodium: 131 mmol/L — ABNORMAL LOW (ref 135–145)

## 2020-01-28 LAB — CBC WITH DIFFERENTIAL/PLATELET
Abs Immature Granulocytes: 0.08 10*3/uL — ABNORMAL HIGH (ref 0.00–0.07)
Basophils Absolute: 0 10*3/uL (ref 0.0–0.1)
Basophils Relative: 0 %
Eosinophils Absolute: 0 10*3/uL (ref 0.0–0.5)
Eosinophils Relative: 0 %
HCT: 24.6 % — ABNORMAL LOW (ref 39.0–52.0)
Hemoglobin: 7.9 g/dL — ABNORMAL LOW (ref 13.0–17.0)
Immature Granulocytes: 1 %
Lymphocytes Relative: 7 %
Lymphs Abs: 0.8 10*3/uL (ref 0.7–4.0)
MCH: 29.4 pg (ref 26.0–34.0)
MCHC: 32.1 g/dL (ref 30.0–36.0)
MCV: 91.4 fL (ref 80.0–100.0)
Monocytes Absolute: 1.1 10*3/uL — ABNORMAL HIGH (ref 0.1–1.0)
Monocytes Relative: 10 %
Neutro Abs: 9.2 10*3/uL — ABNORMAL HIGH (ref 1.7–7.7)
Neutrophils Relative %: 82 %
Platelets: 159 10*3/uL (ref 150–400)
RBC: 2.69 MIL/uL — ABNORMAL LOW (ref 4.22–5.81)
RDW: 16.6 % — ABNORMAL HIGH (ref 11.5–15.5)
WBC: 11.2 10*3/uL — ABNORMAL HIGH (ref 4.0–10.5)
nRBC: 0 % (ref 0.0–0.2)

## 2020-01-28 LAB — COOXEMETRY PANEL
Carboxyhemoglobin: 2.3 % — ABNORMAL HIGH (ref 0.5–1.5)
Methemoglobin: 1.2 % (ref 0.0–1.5)
O2 Saturation: 73.4 %
Total hemoglobin: 8.6 g/dL — ABNORMAL LOW (ref 12.0–16.0)

## 2020-01-28 LAB — GLUCOSE, CAPILLARY: Glucose-Capillary: 137 mg/dL — ABNORMAL HIGH (ref 70–99)

## 2020-01-28 MED ORDER — APIXABAN 5 MG PO TABS: 5.0000 mg | ORAL_TABLET | Freq: Two times a day (BID) | ORAL | Status: DC

## 2020-01-28 MED ORDER — MIDODRINE HCL 5 MG PO TABS
5.0000 mg | ORAL_TABLET | Freq: Three times a day (TID) | ORAL | Status: DC
  Administered 2020-01-28 – 2020-02-01 (×7): 5 mg via ORAL
  Filled 2020-01-28 (×15): qty 1

## 2020-01-28 MED ORDER — METOLAZONE 2.5 MG PO TABS: 2.5000 mg | ORAL_TABLET | Freq: Every day | ORAL | Status: DC

## 2020-01-28 MED ORDER — ALPRAZOLAM 0.25 MG PO TABS
0.2500 mg | ORAL_TABLET | Freq: Every evening | ORAL | Status: AC | PRN
  Administered 2020-01-28: 0.25 mg via ORAL
  Filled 2020-01-28: qty 1

## 2020-01-28 MED ORDER — GLYCOPYRROLATE 0.2 MG/ML IJ SOLN: 0.2000 mg | INTRAMUSCULAR | Status: DC | PRN

## 2020-01-28 MED ORDER — FUROSEMIDE 10 MG/ML IJ SOLN
80.0000 mg | Freq: Once | INTRAMUSCULAR | Status: AC
  Administered 2020-01-28: 09:00:00 80 mg via INTRAVENOUS
  Filled 2020-01-28: qty 8

## 2020-01-28 MED ORDER — ONDANSETRON 4 MG PO TBDP: 4.0000 mg | ORAL_TABLET | Freq: Four times a day (QID) | ORAL | Status: DC | PRN

## 2020-01-28 MED ORDER — GLYCOPYRROLATE 1 MG PO TABS: 1.0000 mg | ORAL_TABLET | ORAL | Status: DC | PRN

## 2020-01-28 MED ORDER — POLYVINYL ALCOHOL 1.4 % OP SOLN: 1.0000 [drp] | Freq: Four times a day (QID) | OPHTHALMIC | Status: DC | PRN

## 2020-01-28 MED ORDER — TORSEMIDE 20 MG PO TABS
20.0000 mg | ORAL_TABLET | Freq: Two times a day (BID) | ORAL | Status: DC
  Administered 2020-01-29: 20 mg via ORAL
  Filled 2020-01-28: qty 1

## 2020-01-28 MED ORDER — HALOPERIDOL LACTATE 2 MG/ML PO CONC: 0.5000 mg | ORAL | Status: DC | PRN

## 2020-01-28 MED ORDER — LORAZEPAM 2 MG/ML IJ SOLN: 0.2500 mg | Freq: Four times a day (QID) | INTRAMUSCULAR | Status: DC | PRN

## 2020-01-28 MED ORDER — HYDROMORPHONE HCL 1 MG/ML IJ SOLN
0.2500 mg | INTRAMUSCULAR | Status: DC | PRN
  Administered 2020-02-01 (×3): 0.5 mg via INTRAVENOUS
  Filled 2020-01-28 (×2): qty 1
  Filled 2020-01-28: qty 0.5
  Filled 2020-01-28: qty 1

## 2020-01-28 MED ORDER — HALOPERIDOL 0.5 MG PO TABS: 0.5000 mg | ORAL_TABLET | ORAL | Status: DC | PRN

## 2020-01-28 MED ORDER — AMIODARONE HCL 200 MG PO TABS
200.0000 mg | ORAL_TABLET | Freq: Every day | ORAL | Status: DC
  Administered 2020-01-28 – 2020-02-01 (×3): 200 mg via ORAL
  Filled 2020-01-28 (×4): qty 1

## 2020-01-28 MED ORDER — BIOTENE DRY MOUTH MT LIQD: 15.0000 mL | OROMUCOSAL | Status: DC | PRN

## 2020-01-28 MED ORDER — "THROMBI-PAD 3""X3"" EX PADS"
1.0000 | MEDICATED_PAD | Freq: Once | CUTANEOUS | Status: AC
  Administered 2020-01-28: 1 via TOPICAL
  Filled 2020-01-28: qty 1

## 2020-01-28 MED ORDER — METOPROLOL SUCCINATE ER 50 MG PO TB24: 50.0000 mg | ORAL_TABLET | Freq: Every day | ORAL | Status: DC

## 2020-01-28 MED ORDER — HALOPERIDOL LACTATE 5 MG/ML IJ SOLN: 0.5000 mg | INTRAMUSCULAR | Status: DC | PRN

## 2020-01-28 MED ORDER — ONDANSETRON HCL 4 MG/2ML IJ SOLN
4.0000 mg | Freq: Four times a day (QID) | INTRAMUSCULAR | Status: DC | PRN
  Filled 2020-01-28: qty 2

## 2020-01-28 MED ORDER — POTASSIUM CHLORIDE CRYS ER 10 MEQ PO TBCR
10.0000 meq | EXTENDED_RELEASE_TABLET | Freq: Once | ORAL | Status: AC
  Administered 2020-01-28: 10 meq via ORAL
  Filled 2020-01-28: qty 1

## 2020-01-28 NOTE — Consult Note (Signed)
Consultation Note Date: 01/28/2020   Patient Name: Rick Gilmore  DOB: [birth year]  MRN: [id number]  Age / Sex: 79 y.o., male  PCP: Crecencio Mc, MD Referring Physician: Evans Lance, MD  Reason for Consultation: Establishing goals of care and Psychosocial/spiritual support  HPI/Patient Profile: 79 y.o. male  with past medical history of combined systolic (LVEF 97%) and diastolic heart failure, valvular disease, kidney failure (cr.4.05) and cirrhosis who was admitted on 01/23/2020 with refractory VTach. He has had 4 admits in 6 months.  Clinical Assessment and Goals of Care:  I have reviewed medical records including EPIC notes, labs and imaging, received report from Oda Kilts, PA-C, examined the patient and met at bedside with his daughter [name]  to discuss diagnosis prognosis, [place], EOL wishes, disposition and options.  I introduced Palliative Medicine as specialized medical care for people living with serious illness. It focuses on providing relief from the symptoms and stress of a serious illness.   We discussed a brief life review of the patient. He had a career working for the [employer].  He is very close to his daughter [name] and 3 grand children.  We discussed his current illness and what it means in the larger context of his on-going co-morbidities.  Natural disease trajectory and expectations at EOL were discussed.  I attempted to elicit values and goals of care important to the patient.  [name] is extremely logical.  He tells me that once one thing is fixed something else breaks.  He is exhausted and does not want to pursue further medical therapy.  He wants to pass away comfortably.  Hospice and Palliative Care services outpatient were explained and offered.  Questions and concerns were addressed.  The family was encouraged to call with questions or concerns.    Primary Decision Maker:   PATIENT    SUMMARY OF RECOMMENDATIONS    TOC order placed for [place].  Patient's specific request. Discussed comfort measures with Dr. Lynetta Mare.  Chest tube and right IJ to be removed. Will place PRN comfort orders. Unrestricted code status to allow his family to visit as his prognosis is very short.  Code Status/Advance Care Planning:  DNR   Symptom Management:   WIll place PRN comfort orders.  Additional Recommendations (Limitations, Scope, Preferences):  Full Comfort Care  Palliative Prophylaxis:   Frequent Pain Assessment/ Assessment for Tachypnea  Psycho-social/Spiritual:   Desire for further Chaplaincy support:  Not discussed.  Prognosis:  Days.    Discharge Planning: Hospice facility      Primary Diagnoses: Present on Admission: **None**   I have reviewed the medical record, interviewed the patient and family, and examined the patient. The following aspects are pertinent.  Past Medical History:  Diagnosis Date  . AICD (automatic cardioverter/defibrillator) present   . Anemia   . Arrhythmia    a fib  . Atrial fibrillation ([place])    on Coumadin  . CAD (coronary artery disease)   . Cancer [facility]) 2001   Prostate, XRT and implant  .  CHF (congestive heart failure) ([place])   . COPD (chronic obstructive pulmonary disease) ([place])   . Erectile dysfunction   . GERD (gastroesophageal reflux disease)   . Hyperlipidemia   . Hypertension   . Ischemic cardiomyopathy    s/p AICD/pacer 2009, replaced 2010 [facility]  . MI (myocardial infarction) ([place]) 1988  . Personal history of prostate cancer 2001   s/p XRT and implant ([place])  . Prostate cancer ([place])   . Sleep apnea, obstructive    uses CPAP nightly  . Thrombocytopenia ([place])   . Tubular adenoma    colon polyp  . Vitamin B 12 deficiency    Social History   Socioeconomic History  . Marital status: Divorced    Spouse name: Not on file  . Number of children: Not on file  . Years of education:  Not on file  . Highest education level: Not on file  Occupational History  . Occupation: Retired  Tobacco Use  . Smoking status: Never Smoker  . Smokeless tobacco: Never Used  Substance and Sexual Activity  . Alcohol use: Yes    Alcohol/week: 4.0 standard drinks    Types: 2 Standard drinks or equivalent, 2 Cans of beer per week    Comment: Mixed drinks  . Drug use: No  . Sexual activity: Never  Other Topics Concern  . Not on file  Social History Narrative  . Not on file   Social Determinants of Health   Financial Resource Strain:   . Difficulty of Paying Living Expenses: Not on file  Food Insecurity:   . Worried About Charity fundraiser in the Last Year: Not on file  . Ran Out of Food in the Last Year: Not on file  Transportation Needs:   . Lack of Transportation (Medical): Not on file  . Lack of Transportation (Non-Medical): Not on file  Physical Activity:   . Days of Exercise per Week: Not on file  . Minutes of Exercise per Session: Not on file  Stress:   . Feeling of Stress : Not on file  Social Connections:   . Frequency of Communication with Friends and Family: Not on file  . Frequency of Social Gatherings with Friends and Family: Not on file  . Attends Religious Services: Not on file  . Active Member of Clubs or Organizations: Not on file  . Attends Archivist Meetings: Not on file  . Marital Status: Not on file   Family History  Problem Relation Age of Onset  . Hypertension Mother   . Hypertension Father   . Cancer Father        prostate, throat  . Emphysema Father   . Heart disease Maternal Grandmother   . Heart disease Paternal Grandmother    Scheduled Meds: . allopurinol  100 mg Oral Daily  . apixaban  5 mg Oral BID  . Chlorhexidine Gluconate Cloth  6 each Topical Daily  . escitalopram  10 mg Oral Daily  . fluticasone furoate-vilanterol  1 puff Inhalation Daily  . folic acid  1 mg Oral Daily  . influenza vaccine adjuvanted  0.5 mL  Intramuscular Tomorrow-1000  . magnesium oxide  400 mg Oral Daily  . mouth rinse  15 mL Mouth Rinse BID  . montelukast  10 mg Oral QHS  . pantoprazole  40 mg Oral Daily  . pneumococcal 23 valent vaccine  0.5 mL Intramuscular Tomorrow-1000  . pravastatin  80 mg Oral q1800  . sodium chloride flush  10-40 mL Intracatheter  Q12H  . umeclidinium bromide  1 puff Inhalation Daily  . vitamin B-12  1,000 mcg Oral q morning - 10a   Continuous Infusions: . amiodarone 30 mg/hr (01/28/20 1037)  . milrinone 0.25 mcg/kg/min (01/28/20 1000)  . norepinephrine (LEVOPHED) Adult infusion 12 mcg/min (01/28/20 1000)   PRN Meds:.acetaminophen **OR** acetaminophen, albuterol, sodium chloride flush No Known Allergies  Physical Exam  Elderly gentleman seen in Cardiac ICU, alert, orientated, appropriate, NAD Right IJ, Right chest tube LE swelling (long standing)  Vital Signs: BP (!) 101/44   Pulse 70   Temp 98 F (36.7 C) (Oral)   Resp (!) 22   Ht '6\' 2"'  (1.88 m)   Wt 99.6 kg   SpO2 100%   BMI 28.19 kg/m  Pain Scale: 0-10   Pain Score: 0-No pain   SpO2: SpO2: 100 % O2 Device:SpO2: 100 % O2 Flow Rate: .O2 Flow Rate (L/min): 2 L/min  IO: Intake/output summary:   Intake/Output Summary (Last 24 hours) at 01/28/2020 1111 Last data filed at 01/28/2020 1000 Gross per 24 hour  Intake 1259.93 ml  Output 1005 ml  Net 254.93 ml    LBM: Last BM Date: 01/23/20 Baseline Weight: Weight: 100.1 kg Most recent weight: Weight: 99.6 kg     Palliative Assessment/Data:  20%     Time In: 10:40 Time Out: 11:10 Time Total: 30 min. Visit consisted of counseling and education dealing with the complex and emotionally intense issues surrounding the need for palliative care and symptom management in the setting of serious and potentially life-threatening illness. Greater than 50%  of this time was spent counseling and coordinating care related to the above assessment and plan.  Signed by: Florentina Jenny,  PA-C Palliative Medicine  Please contact Palliative Medicine Team phone at [phone] for questions and concerns.  For individual provider: See Shea Evans

## 2020-01-28 NOTE — Progress Notes (Addendum)
NAME:  Rick Gilmore, MRN:  [id number], DOB:  [birth year], LOS: 5 ADMISSION DATE:  01/23/2020, CONSULTATION DATE:  01/24/20 REFERRING MD:  Lovena Le, CHIEF COMPLAINT:  VT   Brief History   79 year old man with ischemic cardiomyopathy (post CABG and PCI) w/ AICD in place, prior VT and ablation, cirrhosis w/ portal HTN presenting with Vtach storm found to have pleural effusion for which pulmonology is consulted.  Past Medical History  Ischemic cardiomyopathy, prior VT post ablation with AICD in place Permanent afib OSA on CPAP COPD HTN HLD Prostate cancer  Significant Hospital Events   2/17 admit 2/18 Rt thoracentesis, possible small PTX after  Consults:  EP, Cardiology  Procedures:  2/19 CT placement 2/21 R IJ TLC for CVP, cooximetry  Significant Diagnostic Tests:  Echo 2/18 >> EF 25%, grade 3 DD, RVSP 72.5 mmHg, severe MR Rt thoracentesis 2/18 >> protein < 3, LDH 29  Micro Data:  Rt pleural fluid 2/18 >> NGTD  Antimicrobials:    Interim history/subjective:  Patient had a difficult night.  Received benadryl for anxiety with NI, later had xanax with some improvement.  Patient has had some oozing from his central line, so Eliquis was held this AM.  Patient expressed desired to switch to hospice care.  Objective   Blood pressure (!) 101/41, pulse 70, temperature 98 F (36.7 C), temperature source Oral, resp. rate (!) 24, height 6\' 2"  (1.88 m), weight 99.6 kg, SpO2 100 %. CVP:  [6 mmHg-25 mmHg] 16 mmHg      Intake/Output Summary (Last 24 hours) at 01/28/2020 1103 Last data filed at 01/28/2020 0900 Gross per 24 hour  Intake 1190.83 ml  Output 1005 ml  Net 185.83 ml   Filed Weights   01/23/20 2316 01/24/20 0016 01/25/20 0530  Weight: 100.1 kg 100.1 kg 99.6 kg    Examination:  General - awake, alert, sitting up in bed with laptop open HEENT - trachea midline Cardiac - regular rate/rhythm, 2/6 holosystolic murmur  Resp - mild crackles R lower lung, no wheezing Abdomen  - non-distended Extremities - pitting edema legs R>L; LLE "oozing" - straw colored d/c on wrap Skin - no rashes Neuro - moves all extremities, A/0, interactive   Resolved Hospital Problem list     Assessment & Plan:   Patient desires to transition to hospice care. - Palliative care consult  Transudate Rt pleural effusion. In setting of cirrhosis and CHF.  Thoracentesis on 2/18 - culture with NGTD.  Likely 2/2 right heart failure. - remove chest tube  Possible right pneumothorax after thoracentesis 2/18.  CXR 2/20 improved, resid ~5% PTX. CT to 20 cm, no air leak.  - repeat CXR  AKI on CKD 3b: Last creatinine prior to admission was 2.28 on 12/28.  Poor UOP w/ IV Lasix.  Creatinine continues to rise.  SBP running 90s-100s.  Patient still fluid overloaded. - Daily BMP - Lasix - metolazone - torsemide  COPD with asthma. - using breo and incruse as inpatient - continue singulair - can transition to trelegy as outpt  Hx of OSA. Tolerated CPAP o/n w/o problem - CPAP qhs  Anxiety: - ativan prn  VT. Chronic combined CHF with ischemic cardiomyopathy. Permanent A fib. Hx of CAD. - per cardiology -TLC placed for monitoring  Anemia of critical illness and chronic disease. Thrombocytopenia in setting of cirrhosis. - f/u CBC   Labs    CMP Latest Ref Rng & Units 01/28/2020 01/27/2020 01/26/2020  Glucose 70 - 99 mg/dL 129(H) 126(H)  119(H)  BUN 8 - 23 mg/dL 66(H) 62(H) 58(H)  Creatinine 0.61 - 1.24 mg/dL 4.05(H) 3.78(H) 3.65(H)  Sodium 135 - 145 mmol/L 131(L) 132(L) 134(L)  Potassium 3.5 - 5.1 mmol/L 3.8 4.2 4.0  Chloride 98 - 111 mmol/L 91(L) 93(L) 92(L)  CO2 22 - 32 mmol/L 25 28 28   Calcium 8.9 - 10.3 mg/dL 8.5(L) 8.7(L) 9.0  Total Protein 6.5 - 8.1 g/dL - - -  Total Bilirubin 0.3 - 1.2 mg/dL - - -  Alkaline Phos 38 - 126 U/L - - -  AST 15 - 41 U/L - - -  ALT 0 - 44 U/L - - -   CBC Latest Ref Rng & Units 01/28/2020 01/27/2020 01/26/2020  WBC 4.0 - 10.5 K/uL 11.2(H) 6.6 7.2   Hemoglobin 13.0 - 17.0 g/dL 7.9(L) 7.9(L) 8.5(L)  Hematocrit 39.0 - 52.0 % 24.6(L) 24.6(L) 27.0(L)  Platelets 150 - 400 K/uL 159 80(L) 92(L)    CBG (last 3)  No results for input(s): GLUCAP in the last 72 hours.  I have independently seen and examined the patient, reviewed data, and developed an assessment and plan. A total of 39 minutes were spent in critical care assessment and medical decision making. This critical care time does not reflect procedure time, or teaching time or supervisory time of PA/NP/Med student/Med Resident, etc but could involve care discussion time.  Mallie Darting, MS4

## 2020-01-28 NOTE — Progress Notes (Addendum)
Electrophysiology Rounding Note  Patient Name: Rick Gilmore Date of Encounter: 01/28/2020  Primary Cardiologist: Dr. Rockey Situ Electrophysiologist: Dr. Lovena Le (new)   Subjective   Coox 73% this am on 0.25 mcg of milrinone and 8 mcg of NE this am. SBP 90-100s  Long discussion with Dr. Aundra Dubin this am and pt wants to get hospice involved.  Awaiting consult.   UOP poor on IV lasix. Cr continues to rise.     He denies dyspnea sitting in bed.   Inpatient Medications    Scheduled Meds: . allopurinol  100 mg Oral Daily  . apixaban  5 mg Oral BID  . Chlorhexidine Gluconate Cloth  6 each Topical Daily  . escitalopram  10 mg Oral Daily  . fluticasone furoate-vilanterol  1 puff Inhalation Daily  . folic acid  1 mg Oral Daily  . influenza vaccine adjuvanted  0.5 mL Intramuscular Tomorrow-1000  . magnesium oxide  400 mg Oral Daily  . mouth rinse  15 mL Mouth Rinse BID  . montelukast  10 mg Oral QHS  . pantoprazole  40 mg Oral Daily  . pneumococcal 23 valent vaccine  0.5 mL Intramuscular Tomorrow-1000  . pravastatin  80 mg Oral q1800  . sodium chloride flush  10-40 mL Intracatheter Q12H  . umeclidinium bromide  1 puff Inhalation Daily  . vitamin B-12  1,000 mcg Oral q morning - 10a   Continuous Infusions: . amiodarone 30 mg/hr (01/28/20 0700)  . milrinone 0.25 mcg/kg/min (01/28/20 0700)  . norepinephrine (LEVOPHED) Adult infusion 8 mcg/min (01/28/20 0700)   PRN Meds: acetaminophen **OR** acetaminophen, albuterol, sodium chloride flush   Vital Signs    Vitals:   01/28/20 0730 01/28/20 0755 01/28/20 0800 01/28/20 0819  BP: (!) 102/44  (!) 99/50   Pulse:      Resp: (!) 29  (!) 22   Temp:  98 F (36.7 C)    TempSrc:  Oral    SpO2: 100%  100% 100%  Weight:      Height:        Intake/Output Summary (Last 24 hours) at 01/28/2020 0847 Last data filed at 01/28/2020 0700 Gross per 24 hour  Intake 1071.92 ml  Output 1075 ml  Net -3.08 ml   Filed Weights   01/23/20 2316  01/24/20 0016 01/25/20 0530  Weight: 100.1 kg 100.1 kg 99.6 kg    Physical Exam    GEN- Chronically ill appearing. alert and oriented x 3 today.   Head- normocephalic, atraumatic Eyes-  Sclera clear, conjunctiva pink Ears- hearing intact Oropharynx- clear Neck- supple Lungs- Clear to ausculation bilaterally, normal work of breathing Heart- Regular rate and rhythm, no murmurs, rubs or gallops. JVP elevated to ear. GI- soft, NT, ND, + BS Extremities- no clubbing or cyanosis. 1+ edema bilaterally to knees Skin- no rash or lesion Psych- flat but appropriate affect Neuro- strength and sensation are intact  Labs    CBC Recent Labs    01/27/20 0129 01/28/20 0356  WBC 6.6 11.2*  NEUTROABS  --  9.2*  HGB 7.9* 7.9*  HCT 24.6* 24.6*  MCV 92.1 91.4  PLT 80* 671   Basic Metabolic Panel Recent Labs    01/27/20 0129 01/28/20 0356  NA 132* 131*  K 4.2 3.8  CL 93* 91*  CO2 28 25  GLUCOSE 126* 129*  BUN 62* 66*  CREATININE 3.78* 4.05*  CALCIUM 8.7* 8.5*   Liver Function Tests No results for input(s): AST, ALT, ALKPHOS, BILITOT, PROT, ALBUMIN in the last  72 hours. No results for input(s): LIPASE, AMYLASE in the last 72 hours. Cardiac Enzymes No results for input(s): CKTOTAL, CKMB, CKMBINDEX, TROPONINI in the last 72 hours.   Telemetry    V paced 70s (personally reviewed)  Radiology    DG CHEST PORT 1 VIEW  Result Date: 01/27/2020 CLINICAL DATA:  Central line placement. EXAM: PORTABLE CHEST 1 VIEW COMPARISON:  Chest radiograph from the prior day. FINDINGS: A right internal jugular central venous catheter tip overlies the superior vena cava. A right subclavian cardiac device is unchanged. A right-sided pleural pigtail catheter is unchanged. Median sternotomy wires are redemonstrated. The heart remains enlarged. Vascular calcifications are seen in the aortic arch. The previously seen small right pneumothorax is less conspicuous on today's exam and has likely decreased in  size. There is no left pneumothorax. Left basilar atelectasis/airspace disease appears similar to prior exam. A small left pleural effusion could contribute. There is no right pleural effusion. The right lung is clear. IMPRESSION: 1. Right internal jugular central venous catheter tip overlies the superior vena cava. 2. Decreased small right pneumothorax. 3. Left basilar atelectasis/airspace disease appears similar to prior exam. Electronically Signed   By: Zerita Boers M.D.   On: 01/27/2020 12:09    Patient Profile     Rick Gilmore is a 79 y.o. male with a hx of CAD (CABG 1988, PCI to [place] graft 2003), CKD (III-IV), chronic CHF (systolic), ICM, VT, ICD, AFib, COPD, HTN, HLD, OSA, prostate cancer, cirrhosis, portal HTN, VHD w/severe MR; anemia -Fe def, and Hx of VT ablation 08/27/2019  Transferred to refractory VT below his detection rate. CHF team involved and pt started on inotrope supports.  Assessment & Plan    1. VT (139bpm) Quiescent on amiodarone Device programming changes this admission Therapy zones 130 -170's 6burst 84%/6 ramp 81% 170's-221, 6 burst 84%/6 ramp 81% VF 222bpm w/ATP during charge  2. Hypokalemia K 3.8 this am. Supp carefully with ARF.   3. Acute on chronic systolic CHF Remains volume overloaded with continued AKI and poor UOP despite IV lasix and pressor support AHF team following.  Palliative care team consulted by patient wishes.   4. AFib is described as permanent Continue eliquis for CHA2DS2VASC of at least 5    5. AKI on CKI  Suspect in setting of prolonged VT and CHF Cr up to 4.0.  6. CAD Denies ischemic symptoms.   7. Disposition Palliative care team consulted per patient wishes.   For questions or updates, please contact [facility] Please consult [email or web address] for contact info under Cardiology/STEMI.  Signed, Shirley Friar, PA-C  01/28/2020, 8:47 AM    As above   Discussion re end of life choices.  He is resolved that he  is tired and would like to stop therapies and go to [facility] in [place].  He denies being depressed, although to my unexpert opinion, the alacrity of the decisionbegs the question; having said that he has already written his obituary suggesting that his reflecting on his dying is not so immediate as just this hospitalization  For now will continue current therapies, although I am increasingly less sanguine with Cr going up UO still low and BP reasonable although on levo and milrinone The coox is confusing to me, but dont know enough to interpret a higher coox from the head and neck circulation  Total time 49 min

## 2020-01-28 NOTE — Progress Notes (Signed)
[facility] Progress Note Patient Name: Rick Gilmore DOB: June 13, 1941 MRN: [id number]   Date of Service  01/28/2020  HPI/Events of Note  Patient remains anxious and restless  eICU Interventions  Ordered low dose xanax 0.25 mg PO x 1     Intervention Category Minor Interventions: Agitation / anxiety - evaluation and management  Judd Lien 01/28/2020, 2:18 AM

## 2020-01-28 NOTE — Progress Notes (Signed)
[employer] called to turn off ICD. Chest tube removed. Titrating levophed down per orders.

## 2020-01-28 NOTE — Progress Notes (Signed)
Engineer, maintenance [facility]) Hospital Liaison note.    Received request from [facility] manager Jacqlyn Krauss, RN for family interest in [place]. Hospice Home is unable to offer a room today. Hospital Liaison will follow up tomorrow or sooner if a room becomes available.    A Please do not hesitate to call with questions.    Thank you,   Farrel Gordon, RN, CCM      [facility] (listed on AMION under Hospice and [place] [facility] [place])    [phone]

## 2020-01-28 NOTE — Progress Notes (Addendum)
Advanced Heart Failure Rounding Note  PCP-Cardiologist: No primary care provider on file.   Subjective:    79 y/o male w/ h/o chronic systolic HF 2/2 ICM, EF 59% and severe MR, admitted for syncope and found to be in VT. Initially failed DCCV but eventually converted on IV amiodarone. Milrinone and NE started 2/21 for low output. Initial Co-ox 60%.   Co-ox now 73% on 0.25 mcg of Milrinone. Remains on 8 mcg of NE. SBP 90s-100s.   Poor UOP w/ IV Lasix. Only 470 cc after 80 IV Lasix. Scr continues to rise, 3.65>>3.78>>4.05. CVP 10-11.   Rhythm stable on IV amiodarone. No recurrent VT overnight.   No resting dyspnea. Frustrated and wants hospice.   Objective:   Weight Range: 99.6 kg Body mass index is 28.19 kg/m.   Vital Signs:   Temp:  [97.5 F (36.4 C)-98 F (36.7 C)] 97.6 F (36.4 C) (02/22 0500) Pulse Rate:  [70] 70 (02/21 0749) Resp:  [13-70] 21 (02/22 0700) BP: (79-111)/(37-69) 100/43 (02/22 0700) SpO2:  [90 %-100 %] 99 % (02/22 0700) Last BM Date: 01/23/20  Weight change: Filed Weights   01/23/20 2316 01/24/20 0016 01/25/20 0530  Weight: 100.1 kg 100.1 kg 99.6 kg    Intake/Output:   Intake/Output Summary (Last 24 hours) at 01/28/2020 0713 Last data filed at 01/28/2020 0700 Gross per 24 hour  Intake 1071.92 ml  Output 1095 ml  Net -23.08 ml      Physical Exam    CVP 10-11 General:  Chronically ill appearing WM. No resp difficulty HEENT: Normal Neck: Supple. Elevated JVP to ear . Carotids 2+ bilat; no bruits. No lymphadenopathy or thyromegaly appreciated. Cor: PMI nondisplaced. Regular rate & rhythm. 2/6 HSM at apex Lungs: Clear Abdomen: Soft, nontender, nondistended. No hepatosplenomegaly. No bruits or masses. Good bowel sounds. Extremities: No cyanosis, clubbing, rash, 1+ bilateral LEE up to knees  Neuro: Alert & orientedx3, cranial nerves grossly intact. moves all 4 extremities w/o difficulty. Affect pleasant   Telemetry   NSR. No VT   Labs     CBC Recent Labs    01/27/20 0129 01/28/20 0356  WBC 6.6 11.2*  NEUTROABS  --  9.2*  HGB 7.9* 7.9*  HCT 24.6* 24.6*  MCV 92.1 91.4  PLT 80* 741   Basic Metabolic Panel Recent Labs    01/27/20 0129 01/28/20 0356  NA 132* 131*  K 4.2 3.8  CL 93* 91*  CO2 28 25  GLUCOSE 126* 129*  BUN 62* 66*  CREATININE 3.78* 4.05*  CALCIUM 8.7* 8.5*   Liver Function Tests No results for input(s): AST, ALT, ALKPHOS, BILITOT, PROT, ALBUMIN in the last 72 hours. No results for input(s): LIPASE, AMYLASE in the last 72 hours. Cardiac Enzymes No results for input(s): CKTOTAL, CKMB, CKMBINDEX, TROPONINI in the last 72 hours.  BNP: BNP (last 3 results) Recent Labs    09/08/19 1952 09/30/19 1719 01/23/20 1907  BNP 711.0* 1,277.0* 1,070.0*    ProBNP (last 3 results) Recent Labs    08/17/19 1415  PROBNP 1,124.0*     D-Dimer No results for input(s): DDIMER in the last 72 hours. Hemoglobin A1C No results for input(s): HGBA1C in the last 72 hours. Fasting Lipid Panel No results for input(s): CHOL, HDL, LDLCALC, TRIG, CHOLHDL, LDLDIRECT in the last 72 hours. Thyroid Function Tests No results for input(s): TSH, T4TOTAL, T3FREE, THYROIDAB in the last 72 hours.  Invalid input(s): FREET3  Other results:   Imaging    DG CHEST  PORT 1 VIEW  Result Date: 01/27/2020 CLINICAL DATA:  Central line placement. EXAM: PORTABLE CHEST 1 VIEW COMPARISON:  Chest radiograph from the prior day. FINDINGS: A right internal jugular central venous catheter tip overlies the superior vena cava. A right subclavian cardiac device is unchanged. A right-sided pleural pigtail catheter is unchanged. Median sternotomy wires are redemonstrated. The heart remains enlarged. Vascular calcifications are seen in the aortic arch. The previously seen small right pneumothorax is less conspicuous on today's exam and has likely decreased in size. There is no left pneumothorax. Left basilar atelectasis/airspace disease  appears similar to prior exam. A small left pleural effusion could contribute. There is no right pleural effusion. The right lung is clear. IMPRESSION: 1. Right internal jugular central venous catheter tip overlies the superior vena cava. 2. Decreased small right pneumothorax. 3. Left basilar atelectasis/airspace disease appears similar to prior exam. Electronically Signed   By: Zerita Boers M.D.   On: 01/27/2020 12:09      Medications:     Scheduled Medications: . allopurinol  100 mg Oral Daily  . apixaban  5 mg Oral BID  . Chlorhexidine Gluconate Cloth  6 each Topical Daily  . escitalopram  10 mg Oral Daily  . fluticasone furoate-vilanterol  1 puff Inhalation Daily  . folic acid  1 mg Oral Daily  . influenza vaccine adjuvanted  0.5 mL Intramuscular Tomorrow-1000  . magnesium oxide  400 mg Oral Daily  . mouth rinse  15 mL Mouth Rinse BID  . montelukast  10 mg Oral QHS  . pantoprazole  40 mg Oral Daily  . pneumococcal 23 valent vaccine  0.5 mL Intramuscular Tomorrow-1000  . pravastatin  80 mg Oral q1800  . sodium chloride flush  10-40 mL Intracatheter Q12H  . umeclidinium bromide  1 puff Inhalation Daily  . vitamin B-12  1,000 mcg Oral q morning - 10a     Infusions: . amiodarone 30 mg/hr (01/28/20 0700)  . milrinone 0.25 mcg/kg/min (01/28/20 0700)  . norepinephrine (LEVOPHED) Adult infusion 8 mcg/min (01/28/20 0700)     PRN Medications:  acetaminophen **OR** acetaminophen, albuterol, sodium chloride flush     Assessment/Plan   1. VT: Suspect scar-mediated, electromechanical.  Associated with syncope.  H/o VT ablation at [facility] 9/20, has been on amiodarone at home.  No evidence for ACS (no chest pain, HS-TnI minimally elevated with no trend).  VT was below detection threshold. No further VT on tele.  - MDT CRT-D device adjusted.  - Continue IV amiodarone while on inotropes 2. Acute on chronic systolic CHF: Ischemic cardiomyopathy with MDT CRT-D device.  Echo this  admission with EF 25%, basal-mid inferior/inferolateral/anterolateral akinesis, moderate LV dilation, moderately decreased RV systolic function, PASP 73, moderate TR, severe MR (likely infarct-related), dilated IVC. BP has been low (SBP 80s-90s), AKI on CKD, volume overloaded on exam. Co-ox improved w/ milrinone, 73%. CVP 10-11. Poor initial response to IV Lasix. Only 470 cc in UOP and SCr continues to trend up, now at 4.05.  BP stable on current meds.  - Continue 0.25 mcg of milrinone + NE.  - Suspect severe infarct-related MR plays a role in CHF.  - Will give another dose of IV lasix 80 mg today.  - He is adamant that he does not want any aggressive therapies. He desires hospice. Will place consult to palliative care.   3. Atrial fibrillation: Permanent per notes.  He is on apixaban.  4. Mitral regurgitation: Severe, suspect infarct-related.  This likely plays a role  in CHF.  May be a candidate in future for Mitraclip but needs optimization.   5. AKI on CKD stage 3: Creatinine continues to rise, now 4.05 from baseline around 2.3. Possible cardiorenal syndrome with low output, but syncope/low BP prior to admit may have led to a degree of ATN.  - he dose not want agressive treatment and would not be agreeable to HD 6. OSA: Continue CPAP at night.  7. CAD: H/o CABG.  Doubt ACS at presentation, low level HS-TnI elevation with no trend.  - No ASA with apixaban use.  - Continue statin.  8. Thrombocytopenia: Stable, chronic.    Length of Stay: [address], PA-C  01/28/2020, 7:13 AM  Advanced Heart Failure Team Pager [phone] (M-F; [place])  Please contact [facility] for night-coverage after hours (4p -7a ) and weekends on [email or web address]  Patient seen with PA, agree with the above note.   He does not feel any better on milrinone + norepinephrine, "just tired."  Creatinine up again, poor UOP.  Co-ox 73% with CVP not markedly high, 10-11 on my measure.  Some oozing at CVL site.   He says  that he has had a very hard 6 months and wants to stop, says he wants hospice.   General: NAD Neck: JVP 12+ cm, no thyromegaly or thyroid nodule.  Lungs: Clear to auscultation bilaterally with normal respiratory effort. CV: Nondisplaced PMI.  Heart regular S1/S2, no S3/S4, 2/6 HSM apex.  No peripheral edema.   Abdomen: Soft, nontender, no hepatosplenomegaly, no distention.  Skin: Intact without lesions or rashes.  Neurologic: Alert and oriented x 3.  Psych: Depressed. Extremities: No clubbing or cyanosis.  HEENT: Normal.   Patient seen with PA, agree with the above note.  Good co-ox this morning at 73% and stable BP on milrinone 0.25 + norepinephrine 8.  Minimal diuresis with Lasix 80 mg IV x 1 yesterday.  Creatinine higher at 4.05. He does not feel any better.  - For now, continue current milrinone + norepinephrine.  - Suspect he had ATN related to hypotension with VT prior to admission.  Think supportive care is the best we can do for his renal function for now, will maintain adequate MAP.  I will not involve nephrology at this time, he would not want hemodialysis.  - CVP not markedly high at 10-11, will give a dose of Lasix 80 mg IV x 1 again to see if we can mobilize UOP.   Some bleeding at CVL site, seems to have slowed.  Will hold am apixaban dose.  Baseline hgb low 8 range, 7.9 today.   We had a long talk today about future treatment.  Mr Schwieger is adamant at this point that he wants to involve hospice.  He has talked about this with his daughter.  He is tired, has had a hard last 6 months.  I told him that there was a chance that we could wait it out and get improvement in his renal function, but he says that this would still leave him where he was to begin with, and he feels that his quality of life was poor.  I will involve hospice/palliative care.  If he transitions to hospice care, he would probably not manage at home (lives alone) and would need the hospice house.   CRITICAL CARE  Performed by: Loralie Champagne  Total critical care time: 35 minutes  Critical care time was exclusive of separately billable procedures and treating other patients.  Critical care was necessary to treat or prevent imminent or life-threatening deterioration.  Critical care was time spent personally by me on the following activities: development of treatment plan with patient and/or surrogate as well as nursing, discussions with consultants, evaluation of patient's response to treatment, examination of patient, obtaining history from patient or surrogate, ordering and performing treatments and interventions, ordering and review of laboratory studies, ordering and review of radiographic studies, pulse oximetry and re-evaluation of patient's condition.  Loralie Champagne 01/28/2020 7:50 AM

## 2020-01-28 NOTE — Progress Notes (Signed)
[facility] Progress Note Patient Name: Rick Gilmore DOB: Apr 10, 1941 MRN: [id number]   Date of Service  01/28/2020  HPI/Events of Note  Request for thrombi pad. On review patient is oozing form central line soaking dressing and staining pillow. H/H stable from this morning and platelets 159. INR 1.7 on 2/19 and patient is on apixaban.  eICU Interventions  Thrombi pad ordered. If bleeding persistent may need to assess line and/or hold apixaban     Intervention Category Intermediate Interventions: Bleeding - evaluation and treatment with blood products  Judd Lien 01/28/2020, 4:37 AM

## 2020-01-28 NOTE — TOC Initial Note (Signed)
Transition of Care [facility]) - Initial/Assessment Note    Patient Details  Name: Rick Gilmore MRN: [id number] Date of Birth: Jul 11, 1941  Transition of Care [facility]) CM/SW Contact:    Bethena Roys, RN Phone Number: 01/28/2020, 1:23 PM  Clinical Narrative: Patient presented for syncope and found to be in VT. Prior to arrival patient was from home. Patient has the support of daughter. Palliative care consulted and patient wants to go to residential hospice at discharge. Patient had a specific request for [facility]. Case Manager did make the referral with Bradly Chris- no beds are available at this time. Liaison to make Case Manager aware once the facility has bed availability. Liaison did state that [facility]) has bed availability. Patient will need DNR signed on shadow chart and he will travel via [place] on discharge. Case Manager will continue to follow for additional transition of care needs.                Expected Discharge Plan: [place] Barriers to Discharge: Continued Medical Work up   Patient Goals and CMS Choice Patient states their goals for this hospitalization and ongoing recovery are:: "to go to residential hospice facility" CMS Medicare.gov Compare Post Acute Care list provided to:: (Patient had spoken to palliative Nurse Practitioner- and patient spoke with nurse practitioner and expressed that [facility].)    Expected Discharge Plan and Services Expected Discharge Plan: [place] In-house Referral: NA Discharge Planning Services: CM Consult Post Acute Care Choice: Hospice Living arrangements for the past 2 months: [place]: [facility].) Date [facility] Agency Contacted: 01/28/20 Time HH Agency Contacted:  1321 Representative spoke with at [place]: Bradly Chris  Prior Living Arrangements/Services Living arrangements for the past 2 months: [place] with:: Self(has support of daughter.) Patient language and need for interpreter reviewed:: Yes Do you feel safe going back to the place where you live?: Yes      Need for Family Participation in Patient Care: Yes (Comment) Care giver support system in place?: Yes (comment)   Criminal Activity/Legal Involvement Pertinent to Current Situation/Hospitalization: No - Comment as needed  Activities of Daily Living Home Assistive Devices/Equipment: Walker (specify type) ADL Screening (condition at time of admission) Patient's cognitive ability adequate to safely complete daily activities?: Yes Is the patient deaf or have difficulty hearing?: Yes Does the patient have difficulty seeing, even when wearing glasses/contacts?: No Does the patient have difficulty concentrating, remembering, or making decisions?: No Patient able to express need for assistance with ADLs?: Yes Does the patient have difficulty dressing or bathing?: No Independently performs ADLs?: Yes (appropriate for developmental age) Does the patient have difficulty walking or climbing stairs?: Yes Weakness of Legs: Both Weakness of Arms/Hands: None  Permission Sought/Granted Permission sought to share information with : Family Supports, Chartered certified accountant granted to share information with : Yes, Verbal Permission Granted     Permission granted to share info w AGENCY: [facility]        Emotional Assessment Appearance:: Appears stated age Attitude/Demeanor/Rapport: Engaged Affect (typically observed): Appropriate Orientation: : Oriented to Situation, Oriented to  Time, Oriented to Place, Oriented to Self Alcohol / Substance Use: Not Applicable Psych Involvement: No (comment)  Admission  diagnosis:  VT  (ventricular tachycardia) ([place]) [I47.2] Patient Active Problem List   Diagnosis Date Noted  . Encounter for central line placement   . Postprocedural pneumothorax   . VT (ventricular tachycardia) ([place]) 01/24/2020  . S/P thoracentesis   . Pleural effusion   . Anemia, unspecified 11/23/2019  . AKI (acute kidney injury) ([place]) 11/09/2019  . Enterococcal bacteremia 10/28/2019  . ICD (implantable cardioverter-defibrillator) infection ([place]) 10/28/2019  . Pleural effusion due to CHF (congestive heart failure) ([place]) 09/17/2019  . Hospital discharge follow-up 08/19/2019  . V tach ([place]) 08/09/2019  . Defibrillator discharge 08/09/2019  . V-tach ([place]) 08/09/2019  . Cirrhosis, cryptogenic ([place]) 08/07/2019  . Portal hypertension ([place]) 07/19/2019  . 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 Obesity 04/20/2016  . Hearing loss of left ear due to cerumen impaction 04/19/2016  . Hypotension 03/10/2016  . Allergic rhinitis 02/02/2016  . Acute on chronic systolic CHF (congestive heart failure) ([place]) 01/16/2016  . Chronic renal insufficiency 01/16/2016  . Prediabetes 01/16/2016  . 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 S/P implantation of automatic cardioverter/defibrillator (AICD) 10/21/2015  . Diverticulosis of colon without hemorrhage 10/21/2015  . Internal hemorrhoids 10/21/2015  . Atrial fibrillation ([place]) 10/15/2015  . Tubular adenoma of colon 06/04/2015  . COPD (chronic obstructive pulmonary disease) ([place]) 04/20/2015  . Iron deficiency anemia 04/13/2015  . Thrombocytopenia ([place]) 12/01/2014  . Hyperlipidemia LDL goal <70 12/01/2014  . Chronic systolic heart failure ([place]) 10/08/2014  . Diarrhea 05/07/2013  . Long term current use of anticoagulant therapy 11/02/2011  . Pernicious anemia 11/02/2011  . Ischemic cardiomyopathy   . Sleep apnea, obstructive    PCP:  Crecencio Mc, MD Pharmacy:   [facility] [place] [address] [place] [address] Phone: [phone] Fax: [place] #[id number] [place], [place] - [place] [facility] [place] [address] [place] [address] Phone: [phone] Fax: [phone]     Social Determinants of Health (SDOH) Interventions    Readmission Risk Interventions Readmission Risk Prevention Plan 01/28/2020 10/01/2019 09/11/2019  Transportation Screening Complete Complete Complete  PCP or Specialist Appt within 3-5 Days - Complete Complete  HRI or [place] - Complete Complete  Social Work Consult for [place] Planning/Counseling - Complete Complete  Palliative Care Screening - Not Applicable Not Applicable  Medication Review Press photographer) Complete Complete Complete  HRI or Home Care Consult Complete - -  SW Recovery Care/Counseling Consult Complete - -  Palliative Care Screening Complete - -  [place] Not Applicable - -  Some recent data might be hidden

## 2020-01-28 NOTE — Progress Notes (Signed)
[employer] rep at bedside to turn off ICD.

## 2020-01-29 MED ORDER — TORSEMIDE 20 MG PO TABS
40.0000 mg | ORAL_TABLET | Freq: Two times a day (BID) | ORAL | Status: DC
  Administered 2020-01-29: 20 mg via ORAL
  Administered 2020-01-30 – 2020-02-01 (×4): 40 mg via ORAL
  Filled 2020-01-29 (×9): qty 2

## 2020-01-29 NOTE — Consult Note (Signed)
   [facility] CM Inpatient Consult   01/29/2020  Rick Gilmore [birth year] [id number]    Patient screened for 48% extreme high risk score for unplanned readmission, with 4 hospitalizations and 3 ED visits in the past 6 months; and to check for potential needs for services from [facility]) care management under his Medicare/ [employer] benefits. Patient had been previously engaged by [facility] RN CM for chronic disease management.  Brief chart review of transition of care CM note andPalliative careconsult reveal thatpatient is transitioning to a Hospice Medical Facility(with specific request for [place]).  Patient will receive full care managementservicesunder the Hospice facility.  No needsidentifiedfor [place] [facility] Management at this time.   For questions, please call:  Edwena Felty A. Ara Mano, BSN, RN-BC [facility] Liaison Cell: [phone]

## 2020-01-29 NOTE — Progress Notes (Signed)
NAME:  Rick Gilmore, MRN:  [id number], DOB:  1941/10/23, LOS: 6 ADMISSION DATE:  01/23/2020, CONSULTATION DATE:  01/24/20 REFERRING MD:  Lovena Le, CHIEF COMPLAINT:  VT   Brief History   79 year old man with ischemic cardiomyopathy (post CABG and PCI) w/ AICD in place, prior VT and ablation, cirrhosis w/ portal HTN presenting with Vtach storm found to have pleural effusion for which pulmonology is consulted.  Pt transitioned to palliative care 2/22.  Awaiting hospice placement.  Past Medical History  Ischemic cardiomyopathy, prior VT post ablation with AICD in place Permanent afib OSA on CPAP COPD HTN HLD Prostate cancer  Significant Hospital Events   2/17 admit 2/18 Rt thoracentesis, possible small PTX after 2/22 Pt transitioned to palliative care, desires hospice  Consults:  EP, Cardiology  Procedures:  2/19 CT placement 2/21 R IJ TLC for CVP, cooximetry  Significant Diagnostic Tests:  Echo 2/18 >> EF 25%, grade 3 DD, RVSP 72.5 mmHg, severe MR Rt thoracentesis 2/18 >> protein < 3, LDH 29  Micro Data:  Rt pleural fluid 2/18 >> NGTD  Antimicrobials:    Interim history/subjective:  Patient had chest tube removed and ICD turned off yesterday.  Plan to remove IJ access after family visits this morning.  Objective   Blood pressure (!) 83/47, pulse 70, temperature (!) 97.3 F (36.3 C), temperature source Oral, resp. rate 13, height 6\' 2"  (1.88 m), weight 99.6 kg, SpO2 100 %.        Intake/Output Summary (Last 24 hours) at 01/29/2020 1002 Last data filed at 01/29/2020 4315 Gross per 24 hour  Intake 439.25 ml  Output 450 ml  Net -10.75 ml   Filed Weights   01/23/20 2316 01/24/20 0016 01/25/20 0530  Weight: 100.1 kg 100.1 kg 99.6 kg    Examination:  General - awake, alert, sitting up in bed eating breakfast HEENT - trachea midline Cardiac - regular rate/rhythm, 2/6 holosystolic murmur  Resp - CTAB Abdomen - soft, non-tender, non-distended Extremities - pitting  edema bilaterally Skin - no rashes Neuro - moves all extremities, A/0, interactive   Resolved Hospital Problem list     Assessment & Plan:   Awaiting transition to hospice care - Palliative care following  Transudate Rt pleural effusion. In setting of cirrhosis and CHF.  Thoracentesis on 2/18 - culture with NGTD.  Likely 2/2 right heart failure.  Possible right pneumothorax after thoracentesis 2/18.  CXR 2/20 improved, resid ~5% PTX. CT to 20 cm, no air leak.   AKI on CKD 3b: Last creatinine prior to admission was 2.28 on 12/28.  Poor UOP w/ IV Lasix.  Creatinine continues to rise.  SBP running 90s-100s.  Patient still fluid overloaded.  Treat with goal of symptom improvement. - Lasix - metolazone - torsemide  COPD with asthma. - using breo and incruse as inpatient - continue singulair  Hx of OSA. Tolerated CPAP o/n w/o problem - CPAP qhs  Anxiety: - ativan prn  VT. Chronic combined CHF with ischemic cardiomyopathy. Permanent A fib. Hx of CAD. - per cardiology -TLC placed for monitoring  Anemia of critical illness and chronic disease. Thrombocytopenia in setting of cirrhosis.   Labs    CMP Latest Ref Rng & Units 01/28/2020 01/27/2020 01/26/2020  Glucose 70 - 99 mg/dL 129(H) 126(H) 119(H)  BUN 8 - 23 mg/dL 66(H) 62(H) 58(H)  Creatinine 0.61 - 1.24 mg/dL 4.05(H) 3.78(H) 3.65(H)  Sodium 135 - 145 mmol/L 131(L) 132(L) 134(L)  Potassium 3.5 - 5.1 mmol/L 3.8 4.2 4.0  Chloride 98 - 111 mmol/L 91(L) 93(L) 92(L)  CO2 22 - 32 mmol/L 25 28 28   Calcium 8.9 - 10.3 mg/dL 8.5(L) 8.7(L) 9.0  Total Protein 6.5 - 8.1 g/dL - - -  Total Bilirubin 0.3 - 1.2 mg/dL - - -  Alkaline Phos 38 - 126 U/L - - -  AST 15 - 41 U/L - - -  ALT 0 - 44 U/L - - -   CBC Latest Ref Rng & Units 01/28/2020 01/27/2020 01/26/2020  WBC 4.0 - 10.5 K/uL 11.2(H) 6.6 7.2  Hemoglobin 13.0 - 17.0 g/dL 7.9(L) 7.9(L) 8.5(L)  Hematocrit 39.0 - 52.0 % 24.6(L) 24.6(L) 27.0(L)  Platelets 150 - 400 K/uL 159 80(L)  92(L)    CBG (last 3)  Recent Labs    01/28/20 1126  GLUCAP 137*    I have independently seen and examined the patient, reviewed data, and developed an assessment and plan. A total of 39 minutes were spent in critical care assessment and medical decision making. This critical care time does not reflect procedure time, or teaching time or supervisory time of PA/NP/Med student/Med Resident, etc but could involve care discussion time.  Mallie Darting, MS4

## 2020-01-29 NOTE — Plan of Care (Signed)
  Problem: Education: Goal: Individualized Educational Video(s) Outcome: Progressing   Problem: Cardiac: Goal: Ability to achieve and maintain adequate cardiopulmonary perfusion will improve Outcome: Progressing   Problem: Education: Goal: Knowledge of General Education information will improve Description: Including pain rating scale, medication(s)/side effects and non-pharmacologic comfort measures Outcome: Progressing   Problem: Clinical Measurements: Goal: Ability to maintain clinical measurements within normal limits will improve Outcome: Progressing Goal: Will remain free from infection Outcome: Progressing Goal: Diagnostic test results will improve Outcome: Progressing Goal: Respiratory complications will improve Outcome: Progressing   Problem: Nutrition: Goal: Adequate nutrition will be maintained Outcome: Progressing   Problem: Coping: Goal: Level of anxiety will decrease Outcome: Progressing   Problem: Elimination: Goal: Will not experience complications related to bowel motility Outcome: Progressing Goal: Will not experience complications related to urinary retention Outcome: Progressing   Problem: Pain Managment: Goal: General experience of comfort will improve Outcome: Progressing   Problem: Safety: Goal: Ability to remain free from injury will improve Outcome: Progressing   Problem: Skin Integrity: Goal: Risk for impaired skin integrity will decrease Outcome: Progressing

## 2020-01-29 NOTE — Progress Notes (Signed)
Chaplain engaged in initial visit with Rick Gilmore.  He wants chaplain to visit with him after he has spent time with his grandchildren today.  Chaplain will follow-up.

## 2020-01-29 NOTE — Progress Notes (Addendum)
Advanced Heart Failure Rounding Note  PCP-Cardiologist: No primary care provider on file.   Subjective:    79 y/o male w/ h/o chronic systolic HF 2/2 ICM, EF 23% and severe MR, admitted for syncope and found to be in VT. Initially failed DCCV but eventually converted on IV amiodarone. Milrinone and NE started 2/21 for low output. Initial Co-ox 60%.   Despite milrinone and IV Lasix, poor urinary response w/ progressive renal failure. Palliative care has seen and pt wishes not to continue w/ agressive treatment and now desires home hospice.   ICD has been deactivated. Milrinone discontinued. No lab draws today.   Resting comfortably. No dyspnea. Looking forward to his 3 grandchildren visiting today.   Objective:   Weight Range: 99.6 kg Body mass index is 28.19 kg/m.   Vital Signs:   Temp:  [96.7 F (35.9 C)-98.1 F (36.7 C)] 97.6 F (36.4 C) (02/23 0054) Resp:  [14-43] 15 (02/23 0500) BP: (85-114)/(40-84) 95/62 (02/23 0500) SpO2:  [99 %-100 %] 100 % (02/23 0500) Last BM Date: 01/23/20  Weight change: Filed Weights   01/23/20 2316 01/24/20 0016 01/25/20 0530  Weight: 100.1 kg 100.1 kg 99.6 kg    Intake/Output:   Intake/Output Summary (Last 24 hours) at 01/29/2020 0712 Last data filed at 01/29/2020 7628 Gross per 24 hour  Intake 657.26 ml  Output 450 ml  Net 207.26 ml      Physical Exam   General:  Chronically ill appearing WM. No resp difficulty HEENT: Normal Neck: Supple. Elevated JVP. + Rt IJ CVC (bloody). Carotids 2+ bilat; no bruits. No lymphadenopathy or thyromegaly appreciated. Cor: PMI nondisplaced. Regular rate & rhythm. 2/6 HSM at apex Lungs: Clear Abdomen: Soft, nontender, nondistended. No hepatosplenomegaly. No bruits or masses. Good bowel sounds. Extremities: No cyanosis, clubbing, rash, 1+ bilateral LEE up to knees  Neuro: Alert & orientedx3, cranial nerves grossly intact. moves all 4 extremities w/o difficulty. Affect pleasant   Telemetry    Paced 70s No recurrent VT   Labs    CBC Recent Labs    01/27/20 0129 01/28/20 0356  WBC 6.6 11.2*  NEUTROABS  --  9.2*  HGB 7.9* 7.9*  HCT 24.6* 24.6*  MCV 92.1 91.4  PLT 80* 315   Basic Metabolic Panel Recent Labs    01/27/20 0129 01/28/20 0356  NA 132* 131*  K 4.2 3.8  CL 93* 91*  CO2 28 25  GLUCOSE 126* 129*  BUN 62* 66*  CREATININE 3.78* 4.05*  CALCIUM 8.7* 8.5*   Liver Function Tests No results for input(s): AST, ALT, ALKPHOS, BILITOT, PROT, ALBUMIN in the last 72 hours. No results for input(s): LIPASE, AMYLASE in the last 72 hours. Cardiac Enzymes No results for input(s): CKTOTAL, CKMB, CKMBINDEX, TROPONINI in the last 72 hours.  BNP: BNP (last 3 results) Recent Labs    09/08/19 1952 09/30/19 1719 01/23/20 1907  BNP 711.0* 1,277.0* 1,070.0*    ProBNP (last 3 results) Recent Labs    08/17/19 1415  PROBNP 1,124.0*     D-Dimer No results for input(s): DDIMER in the last 72 hours. Hemoglobin A1C No results for input(s): HGBA1C in the last 72 hours. Fasting Lipid Panel No results for input(s): CHOL, HDL, LDLCALC, TRIG, CHOLHDL, LDLDIRECT in the last 72 hours. Thyroid Function Tests No results for input(s): TSH, T4TOTAL, T3FREE, THYROIDAB in the last 72 hours.  Invalid input(s): FREET3  Other results:   Imaging    No results found.   Medications:  Scheduled Medications: . allopurinol  100 mg Oral Daily  . amiodarone  200 mg Oral Daily  . Chlorhexidine Gluconate Cloth  6 each Topical Daily  . escitalopram  10 mg Oral Daily  . fluticasone furoate-vilanterol  1 puff Inhalation Daily  . folic acid  1 mg Oral Daily  . influenza vaccine adjuvanted  0.5 mL Intramuscular Tomorrow-1000  . magnesium oxide  400 mg Oral Daily  . mouth rinse  15 mL Mouth Rinse BID  . metolazone  2.5 mg Oral Daily  . midodrine  5 mg Oral TID WC  . montelukast  10 mg Oral QHS  . pantoprazole  40 mg Oral Daily  . pneumococcal 23 valent vaccine  0.5 mL  Intramuscular Tomorrow-1000  . pravastatin  80 mg Oral q1800  . sodium chloride flush  10-40 mL Intracatheter Q12H  . torsemide  20 mg Oral BID  . umeclidinium bromide  1 puff Inhalation Daily  . vitamin B-12  1,000 mcg Oral q morning - 10a    Infusions: . norepinephrine (LEVOPHED) Adult infusion 2 mcg/min (01/29/20 0500)    PRN Medications: acetaminophen **OR** acetaminophen, albuterol, antiseptic oral rinse, glycopyrrolate **OR** glycopyrrolate **OR** glycopyrrolate, haloperidol **OR** haloperidol **OR** haloperidol lactate, HYDROmorphone (DILAUDID) injection, LORazepam, ondansetron **OR** ondansetron (ZOFRAN) IV, polyvinyl alcohol, sodium chloride flush     Assessment/Plan   1. VT: Suspect scar-mediated, electromechanical.  Associated with syncope.  H/o VT ablation at [facility] 9/20, has been on amiodarone at home.  No evidence for ACS (no chest pain, HS-TnI minimally elevated with no trend).  VT was below detection threshold. No further VT on tele.  - MDT CRT-D device adjusted but later deactivated 2/22 for end of life comfort measures. - Continue PO amiodarone 200 mg daily 2. Acute on chronic systolic CHF: Ischemic cardiomyopathy with MDT CRT-D device.  Echo this admission with EF 25%, basal-mid inferior/inferolateral/anterolateral akinesis, moderate LV dilation, moderately decreased RV systolic function, PASP 73, moderate TR, severe MR (likely infarct-related), dilated IVC. Suspect severe infarct-related MR plays a role in CHF. BP has been low (SBP 80s-90s), AKI on CKD, volume overloaded on exam. Co-ox improved w/ milrinone up to 73%. However pt has shifted to comfort measures only. Plan transition to Home Hospice. Milrinone discontinued. - continue torsemide  - no lab draws today 3. Atrial fibrillation: Permanent per notes.  Apixaban stopped with transition to comfort.  4. Mitral regurgitation: Severe, suspect infarct-related.  This likely plays a role in CHF.  - no plans for [facility].  Plan for hospice.  5. AKI on CKD stage 3: progressive renal failure w/ SCr up to 4.05. Baseline around 2.3. Possible cardiorenal syndrome with low output, but syncope/low BP prior to admit may have led to a degree of ATN.  - he does not want agressive treatment and would not be agreeable to HD 6. OSA: Continue CPAP at night.  7. CAD: H/o CABG.  Doubt ACS at presentation, low level HS-TnI elevation with no trend.  - No ASA with apixaban use.  - Continue statin.  8. Thrombocytopenia: Stable, chronic.   Length of Stay: [address], [place]  01/29/2020, 7:12 AM  Advanced Heart Failure Team Pager [phone] (M-F; [place])  Please contact [facility] for night-coverage after hours (4p -7a ) and weekends on [email or web address]  Patient seen with PA, agree with the above note.   Patient has decided on comfort care transition.  He is very comfortable with his decision and has talked about it with his family.  ICD has been turned off.  He will be going to hospice house in [place] when a room is available.   Increase torsemide to 40 mg bid, stop metolazone.   Loralie Champagne 01/29/2020 7:39 AM

## 2020-01-29 NOTE — Progress Notes (Signed)
Engineer, maintenance [facility]) Hospital Liaison note.    [facility] does not have availability today.  Family and TOC manager updated. Hospital Liaison will follow up tomorrow or sooner if a room becomes available.     A Please do not hesitate to call with questions.    Thank you,   Farrel Gordon, RN, CCM      [place] (listed on AMION under [facility]osp[facility] [place] [facility] [place])    [phone]

## 2020-01-29 NOTE — Progress Notes (Signed)
Chaplain engaged in follow-up visit.  Rick Gilmore expressed peace and comfort from being and speaking with his family about his decision for hospice care.  Rick Gilmore has been making sure to call his friends and family to not leave anything undone.  He expresses a readiness to be with God.  Chaplain provided listening ear and affirmed his ability to advocate for himself and what he wants and needs.  Chaplain will follow-up.

## 2020-01-29 NOTE — Progress Notes (Addendum)
Electrophysiology Rounding Note  Patient Name: Rick Gilmore Date of Encounter: 01/29/2020  Electrophysiologist: Dr. Lovena Le   Subjective   No labs this am. At this time, the patient denies chest pain, shortness of breath, or any new concerns.  Pt decision to go to hospice; awaiting bed placement. ICD therapies de-activated 2.22.21  Inpatient Medications    Scheduled Meds:  allopurinol  100 mg Oral Daily   amiodarone  200 mg Oral Daily   Chlorhexidine Gluconate Cloth  6 each Topical Daily   escitalopram  10 mg Oral Daily   fluticasone furoate-vilanterol  1 puff Inhalation Daily   folic acid  1 mg Oral Daily   influenza vaccine adjuvanted  0.5 mL Intramuscular Tomorrow-1000   magnesium oxide  400 mg Oral Daily   mouth rinse  15 mL Mouth Rinse BID   midodrine  5 mg Oral TID WC   montelukast  10 mg Oral QHS   pantoprazole  40 mg Oral Daily   pneumococcal 23 valent vaccine  0.5 mL Intramuscular Tomorrow-1000   pravastatin  80 mg Oral q1800   sodium chloride flush  10-40 mL Intracatheter Q12H   torsemide  40 mg Oral BID   umeclidinium bromide  1 puff Inhalation Daily   vitamin B-12  1,000 mcg Oral q morning - 10a   Continuous Infusions:  norepinephrine (LEVOPHED) Adult infusion 2 mcg/min (01/29/20 0500)   PRN Meds: acetaminophen **OR** acetaminophen, albuterol, antiseptic oral rinse, glycopyrrolate **OR** glycopyrrolate **OR** glycopyrrolate, haloperidol **OR** haloperidol **OR** haloperidol lactate, HYDROmorphone (DILAUDID) injection, LORazepam, ondansetron **OR** ondansetron (ZOFRAN) IV, polyvinyl alcohol, sodium chloride flush   Vital Signs    Vitals:   01/29/20 0600 01/29/20 0700 01/29/20 0800 01/29/20 0812  BP: (!) 95/55 (!) 89/49 (!) 83/47   Pulse:      Resp: 16 17 13    Temp:    (!) 97.3 F (36.3 C)  TempSrc:    Oral  SpO2: 100% 97% 100%   Weight:      Height:        Intake/Output Summary (Last 24 hours) at 01/29/2020 0858 Last data filed at 01/29/2020  0611 Gross per 24 hour  Intake 599.63 ml  Output 450 ml  Net 149.63 ml   Filed Weights   01/23/20 2316 01/24/20 0016 01/25/20 0530  Weight: 100.1 kg 100.1 kg 99.6 kg    Physical Exam    GEN- The patient is chronically ill appearing, alert and oriented x 3 today.   Head- normocephalic, atraumatic Eyes-  Sclera clear, conjunctiva pink Ears- hearing intact Oropharynx- clear Neck- supple Lungs- Diminished,  normal work of breathing Heart- Regular rate and rhythm, no murmurs, rubs or gallops GI- soft, NT, ND, + BS Extremities- no clubbing or cyanosis. 1+ edema bilatearly.  Skin- no rash or lesion Psych- euthymic mood, full affect Neuro- strength and sensation are intact  Labs    CBC Recent Labs    01/27/20 0129 01/28/20 0356  WBC 6.6 11.2*  NEUTROABS  --  9.2*  HGB 7.9* 7.9*  HCT 24.6* 24.6*  MCV 92.1 91.4  PLT 80* 503   Basic Metabolic Panel Recent Labs    01/27/20 0129 01/28/20 0356  NA 132* 131*  K 4.2 3.8  CL 93* 91*  CO2 28 25  GLUCOSE 126* 129*  BUN 62* 66*  CREATININE 3.78* 4.05*  CALCIUM 8.7* 8.5*   Liver Function Tests No results for input(s): AST, ALT, ALKPHOS, BILITOT, PROT, ALBUMIN in the last 72 hours. No results for input(s):  LIPASE, AMYLASE in the last 72 hours. Cardiac Enzymes No results for input(s): CKTOTAL, CKMB, CKMBINDEX, TROPONINI in the last 72 hours.   Telemetry    V paced 70s (personally reviewed)  Radiology    DG CHEST PORT 1 VIEW  Result Date: 01/27/2020 CLINICAL DATA:  Central line placement. EXAM: PORTABLE CHEST 1 VIEW COMPARISON:  Chest radiograph from the prior day. FINDINGS: A right internal jugular central venous catheter tip overlies the superior vena cava. A right subclavian cardiac device is unchanged. A right-sided pleural pigtail catheter is unchanged. Median sternotomy wires are redemonstrated. The heart remains enlarged. Vascular calcifications are seen in the aortic arch. The previously seen small right  pneumothorax is less conspicuous on today's exam and has likely decreased in size. There is no left pneumothorax. Left basilar atelectasis/airspace disease appears similar to prior exam. A small left pleural effusion could contribute. There is no right pleural effusion. The right lung is clear. IMPRESSION: 1. Right internal jugular central venous catheter tip overlies the superior vena cava. 2. Decreased small right pneumothorax. 3. Left basilar atelectasis/airspace disease appears similar to prior exam. Electronically Signed   By: Zerita Boers M.D.   On: 01/27/2020 12:09    Patient Profile     Rick Gilmore is a 79 y.o. 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 CHF team involved and pt started on inotrope supports.  Assessment & Plan    1. VT (139bpm) Quiescent on amiodarone No further.  Pt decided on hospice, tachy-therapies de-activated. Pt now DNR.    2. Hypokalemia No further labs with hospice care decided on.    3. Acute on chronic systolic CHF Remains at least mildly volume overloaded.  Appreciate CHF team input.  Po diuretics increased for comfort.    4. AFib is described as permanent Continue eliquis for now.    5. AKI on CKI  Suspect ATN in setting of prolonged VT and CHF No further labs.    6. CAD No ischemic symptoms.    7. Disposition Plan to hospice house pending bed.   For questions or updates, please contact [place] Please consult [email or web address] for contact info under Cardiology/STEMI.  Signed, Shirley Friar, PA-C  01/29/2020, 8:58 AM   EP Attending  Patient seen and examined. Agree with above. The patient has continued to have renal dysfunction. He is hypotensive but he is not in pain. He  will be discharged to hospice.   Mikle Bosworth.D.

## 2020-01-30 DIAGNOSIS — Z515 Encounter for palliative care: Secondary | ICD-10-CM

## 2020-01-30 DIAGNOSIS — I5040 Unspecified combined systolic (congestive) and diastolic (congestive) heart failure: Secondary | ICD-10-CM

## 2020-01-30 MED ORDER — WITCH HAZEL-GLYCERIN EX PADS
MEDICATED_PAD | CUTANEOUS | Status: DC | PRN
  Administered 2020-01-30: 1 via TOPICAL
  Filled 2020-01-30 (×2): qty 100

## 2020-01-30 NOTE — Progress Notes (Signed)
Patient ID: Rick Gilmore, male   DOB: Apr 26, 1941, 79 y.o.   MRN: [id number]     Advanced Heart Failure Rounding Note  PCP-Cardiologist: No primary care provider on file.   Subjective:    79 y/o male w/ h/o chronic systolic HF 2/2 ICM, EF 83% and severe MR, admitted for syncope and found to be in VT. Initially failed DCCV but eventually converted on IV amiodarone. Milrinone and NE started 2/21 for low output. Initial Co-ox 60%.   Despite milrinone and IV Lasix, poor urinary response w/ progressive renal failure. Palliative care has seen and pt wishes not to continue w/ agressive treatment and now desires home hospice.   ICD has been deactivated. Milrinone discontinued. No further lab draws.   Resting comfortably. No dyspnea.  Feels too weak to get out of bed.    Objective:   Weight Range: 99.6 kg Body mass index is 28.19 kg/m.   Vital Signs:   Temp:  [97.7 F (36.5 C)-98.2 F (36.8 C)] 98.2 F (36.8 C) (02/24 0506) Pulse Rate:  [69-72] 72 (02/24 0506) Resp:  [16-17] 16 (02/24 0506) BP: (87-94)/(54) 87/54 (02/24 0506) SpO2:  [91 %-96 %] 96 % (02/24 0506) FiO2 (%):  [21 %] 21 % (02/23 2152) Last BM Date: 01/23/20  Weight change: Filed Weights   01/23/20 2316 01/24/20 0016 01/25/20 0530  Weight: 100.1 kg 100.1 kg 99.6 kg    Intake/Output:   Intake/Output Summary (Last 24 hours) at 01/30/2020 1023 Last data filed at 01/30/2020 0801 Gross per 24 hour  Intake 300 ml  Output 725 ml  Net -425 ml      Physical Exam   General: NAD Neck: JVP 8-9 cm, no thyromegaly or thyroid nodule.  Lungs: Clear to auscultation bilaterally with normal respiratory effort. CV: Nondisplaced PMI.  Heart regular S1/S2, no S3/S4, no murmur.  Trace ankle edema.  Abdomen: Soft, nontender, no hepatosplenomegaly, no distention.  Skin: Intact without lesions or rashes.  Neurologic: Alert and oriented x 3.  Psych: Normal affect. Extremities: No clubbing or cyanosis.  HEENT: Normal.     Telemetry   Not on telemetry  Labs    CBC Recent Labs    01/28/20 0356  WBC 11.2*  NEUTROABS 9.2*  HGB 7.9*  HCT 24.6*  MCV 91.4  PLT 419   Basic Metabolic Panel Recent Labs    01/28/20 0356  NA 131*  K 3.8  CL 91*  CO2 25  GLUCOSE 129*  BUN 66*  CREATININE 4.05*  CALCIUM 8.5*   Liver Function Tests No results for input(s): AST, ALT, ALKPHOS, BILITOT, PROT, ALBUMIN in the last 72 hours. No results for input(s): LIPASE, AMYLASE in the last 72 hours. Cardiac Enzymes No results for input(s): CKTOTAL, CKMB, CKMBINDEX, TROPONINI in the last 72 hours.  BNP: BNP (last 3 results) Recent Labs    09/08/19 1952 09/30/19 1719 01/23/20 1907  BNP 711.0* 1,277.0* 1,070.0*    ProBNP (last 3 results) Recent Labs    08/17/19 1415  PROBNP 1,124.0*     D-Dimer No results for input(s): DDIMER in the last 72 hours. Hemoglobin A1C No results for input(s): HGBA1C in the last 72 hours. Fasting Lipid Panel No results for input(s): CHOL, HDL, LDLCALC, TRIG, CHOLHDL, LDLDIRECT in the last 72 hours. Thyroid Function Tests No results for input(s): TSH, T4TOTAL, T3FREE, THYROIDAB in the last 72 hours.  Invalid input(s): FREET3  Other results:   Imaging    No results found.   Medications:  Scheduled Medications: . allopurinol  100 mg Oral Daily  . amiodarone  200 mg Oral Daily  . Chlorhexidine Gluconate Cloth  6 each Topical Daily  . escitalopram  10 mg Oral Daily  . fluticasone furoate-vilanterol  1 puff Inhalation Daily  . folic acid  1 mg Oral Daily  . influenza vaccine adjuvanted  0.5 mL Intramuscular Tomorrow-1000  . magnesium oxide  400 mg Oral Daily  . mouth rinse  15 mL Mouth Rinse BID  . midodrine  5 mg Oral TID WC  . montelukast  10 mg Oral QHS  . pantoprazole  40 mg Oral Daily  . pneumococcal 23 valent vaccine  0.5 mL Intramuscular Tomorrow-1000  . pravastatin  80 mg Oral q1800  . sodium chloride flush  10-40 mL Intracatheter Q12H  .  torsemide  40 mg Oral BID  . umeclidinium bromide  1 puff Inhalation Daily  . vitamin B-12  1,000 mcg Oral q morning - 10a    Infusions:   PRN Medications: acetaminophen **OR** acetaminophen, albuterol, antiseptic oral rinse, glycopyrrolate **OR** glycopyrrolate **OR** glycopyrrolate, haloperidol **OR** haloperidol **OR** haloperidol lactate, HYDROmorphone (DILAUDID) injection, LORazepam, ondansetron **OR** ondansetron (ZOFRAN) IV, polyvinyl alcohol, sodium chloride flush     Assessment/Plan   1. VT: Suspect scar-mediated, electromechanical.  Associated with syncope.  H/o VT ablation at [facility] 9/20, has been on amiodarone at home.  No evidence for ACS (no chest pain, HS-TnI minimally elevated with no trend).  VT was below detection threshold. No further VT on tele.  - MDT CRT-D device adjusted but later deactivated 2/22 for end of life comfort measures. - Continue PO amiodarone 200 mg daily 2. Acute on chronic systolic CHF: Ischemic cardiomyopathy with MDT CRT-D device.  Echo this admission with EF 25%, basal-mid inferior/inferolateral/anterolateral akinesis, moderate LV dilation, moderately decreased RV systolic function, PASP 73, moderate TR, severe MR (likely infarct-related), dilated IVC. Suspect severe infarct-related MR plays a role in CHF. BP has been low (SBP 80s-90s), AKI on CKD, volume overloaded on exam. Co-ox improved w/ milrinone up to 73%. However pt has shifted to comfort measures only. Plan transition to Home Hospice. Milrinone and norepinephrine discontinued. - continue torsemide 40 mg bid. He is making urine.  - no lab draws today 3. Atrial fibrillation: Permanent per notes.  Apixaban stopped with transition to comfort.  4. Mitral regurgitation: Severe, suspect infarct-related.  This likely plays a role in CHF.  - no plans for [facility]. Plan for hospice.  5. AKI on CKD stage 3: progressive renal failure w/ SCr up to 4.05. Baseline around 2.3. Possible cardiorenal syndrome  with low output, but syncope/low BP prior to admit may have led to a degree of ATN.  - he does not want agressive treatment and would not be agreeable to HD 6. OSA: Continue CPAP at night.  7. CAD: H/o CABG.  Doubt ACS at presentation, low level HS-TnI elevation with no trend.  8. Thrombocytopenia: Stable, chronic.   I discussed plan for hospice with patient's grandchildren.  Waiting for hospice house bed.  Will sign off.   Length of Stay: 7  Loralie Champagne, MD  01/30/2020, 10:23 AM  Advanced Heart Failure Team Pager [phone] (M-F; 7a - 4p)  Please contact [facility] for night-coverage after hours (4p -7a ) and weekends on [email or web address]

## 2020-01-30 NOTE — Progress Notes (Signed)
Chaplain went to engage in follow-up visit with Rick Gilmore.  Rick Gilmore requested that chaplain come back and visit because he was speaking with family or friends on laptop.    Chaplain will follow-up.

## 2020-01-30 NOTE — TOC Progression Note (Addendum)
Transition of Care [facility]) - Progression Note    Patient Details  Name: Rick Gilmore MRN: [id number] Date of Birth: 04/14/1941  Transition of Care [facility]) CM/SW [address],  Phone Number: 01/30/2020, 10:30 AM  Clinical Narrative:    11:17am- Per Bevely Palmer, pt liaison is Judeen Hammans. CSW spoke with Judeen Hammans and they report still no bed availability at [facility]. Judeen Hammans will f/u with pt and pt family.    10:30am- CSW has contacted [facility] liaison Bevely Palmer in regards to bed availability at [facility]. Await response/bed availability.    Expected Discharge Plan: [place] Barriers to Discharge: Continued Medical Work up  Expected Discharge Plan and Services Expected Discharge Plan: [place] In-house Referral: NA Discharge Planning Services: CM Consult Post Acute Care Choice: Hospice Living arrangements for the past 2 months: [place]: Hospice and [place] [facility].) Date [facility] Contacted: 01/28/20 Time [place]: 1321 Representative spoke with at [place]: Bradly Chris  Readmission Risk Interventions Readmission Risk Prevention Plan 01/28/2020 10/01/2019 09/11/2019  Transportation Screening Complete Complete Complete  PCP or Specialist Appt within 3-5 Days - Complete Complete  HRI or [place] - Complete Complete  Social Work Consult for [place] Planning/Counseling - Complete Complete  Palliative Care Screening - Not Applicable Not Applicable  Medication Review Press photographer) Complete Complete Complete  HRI or Home Care Consult Complete - -  SW Recovery Care/Counseling Consult Complete - -  Palliative Care Screening Complete - -  [place] Not Applicable - -  Some recent data might be hidden

## 2020-01-30 NOTE — Plan of Care (Signed)
Pt alert and oriented x4 able to express needs denies pain. Pt on comfort care. Patient refused all meds except torsemide to keep fluid off at this time expressed does not want any other medications. Demonstrates proper use of call belll, bed low skid resistant socks presently on patient. Will continue to monitor

## 2020-01-30 NOTE — Discharge Summary (Addendum)
ELECTROPHYSIOLOGY DISCHARGE SUMMARY    Patient ID: Rick Gilmore,  MRN: [id number], DOB/AGE: [birth year] 79 y.o.  Admit date: 01/23/2020 Discharge date: 02/01/2020  Primary Care Physician: Crecencio Mc, MD  Electrophysiologist: Dr. Caryl Comes  Primary Discharge Diagnosis:  Ventricular tachycardia Acute on chronic systolic CHF  Secondary Discharge Diagnosis:  Hypokalemia Permanent atrial fibrillation Acute on chronic systolic CHF AKI on CKD III CAD End of Life care  Brief HPI: Rick Gilmore is a 79 y.o. 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   Hospital Course:  The patient was initially presented to [facility] ED after a syncopal episode. Found to be having episodes of VT below his detection. He initially failed DCCV and was transferred to [facility]. He converted to NSR on IV amiodarone. ICD therapies adjusted. Echo 01/24/20 showed LVEF 25%. CHF team asked to see for poor UOP and concerns for low output. Pt started on milrinone and norepi but continued to exhibit progressive renal failure and poor urinary response.   Per patient request, conversation initiated with palliative care team during which he discussed his desire to stop any aggressive care and to pursue comfort measures. ICD therapies deactivated.  Bed requested with [facility] and pt monitored while awaiting placement. Labs and tele discontinued.   On day of discharge, pt was having more dyspnea which improved with Dilaudid. He reported having a poor night, and still feeling too weak to get out of bed.  His expected prognosis is weeks, and deemed suitable for Hospice house. In the event he does graduate hospice, EP team will be  available to see him and continue to manage his VT as needed.   Dr. Lovena Le examined the patient on day of discharge and determined him to be stable for transfer to hospice house.   Physical Exam: Vitals:   01/31/20 0944 01/31/20 1400 01/31/20 1417 02/01/20 0459  BP:   94/64 (!) 91/55  Pulse:   69 71  Resp:   20 20  Temp:   97.6 F (36.4 C) (!) 97.4 F (36.3 C)  TempSrc:   Oral Oral  SpO2: 93% 90% 98% 97%  Weight:      Height:        Labs:   Lab Results  Component Value Date   WBC 11.2 (H) 01/28/2020   HGB 7.9 (L) 01/28/2020   HCT 24.6 (L) 01/28/2020   MCV 91.4 01/28/2020   PLT 159 01/28/2020    Recent Labs  Lab 01/28/20 0356  NA 131*  K 3.8  CL 91*  CO2 25  BUN 66*  CREATININE 4.05*  CALCIUM 8.5*  GLUCOSE 129*     Discharge Medications:  Allergies as of 02/01/2020   No Known Allergies     Medication List    STOP taking these medications   allopurinol 100 MG tablet Commonly known as: ZYLOPRIM   Cyanocobalamin 2725 MCG Subl   folic acid 1 MG tablet Commonly known as: FOLVITE   metolazone 2.5 MG tablet Commonly known as: ZAROXOLYN   metoprolol succinate 50 MG 24 hr tablet Commonly known as: TOPROL-XL   potassium chloride SA 20 MEQ tablet Commonly known as: KLOR-CON   pravastatin 80 MG tablet Commonly known as: PRAVACHOL  TAKE these medications   acetaminophen 325 MG tablet Commonly known as: TYLENOL Take 2 tablets (650 mg total) by mouth every 6 (six) hours as needed for mild pain (or Fever >/= 101).   acetaminophen 650 MG suppository Commonly known as: TYLENOL Place 1 suppository (650 mg total) rectally every 6 (six) hours as needed for mild pain (or Fever >/= 101).   albuterol 108 (90 Base) MCG/ACT inhaler Commonly known as: VENTOLIN HFA Inhale 2 puffs into the lungs every 6 (six) hours as needed for wheezing or shortness of breath.   amiodarone 400 MG tablet Commonly known as: PACERONE Take 200 mg by mouth daily.   Anoro  Ellipta 62.5-25 MCG/INH Aepb Generic drug: umeclidinium-vilanterol USE 1 INHALATION DAILY What changed:   how much to take  how to take this  when to take this  additional instructions   antiseptic oral rinse Liqd Apply 15 mLs topically as needed for dry mouth.   mouth rinse Liqd solution 15 mLs by Mouth Rinse route 2 (two) times daily.   Eliquis 5 MG Tabs tablet Generic drug: apixaban Take 5 mg by mouth 2 (two) times daily.   escitalopram 10 MG tablet Commonly known as: LEXAPRO Take 1 tablet (10 mg total) by mouth daily.   glycopyrrolate 1 MG tablet Commonly known as: ROBINUL Take 1 tablet (1 mg total) by mouth every 4 (four) hours as needed (excessive secretions).   haloperidol 0.5 MG tablet Commonly known as: HALDOL Take 1 tablet (0.5 mg total) by mouth every 4 (four) hours as needed for agitation (or delirium).   haloperidol 2 MG/ML solution Commonly known as: HALDOL Place 0.3 mLs (0.6 mg total) under the tongue every 4 (four) hours as needed for agitation (or delirium).   haloperidol lactate 5 MG/ML injection Commonly known as: HALDOL Inject 0.1 mLs (0.5 mg total) into the vein every 4 (four) hours as needed (or delirium).   HYDROmorphone 1 MG/ML injection Commonly known as: DILAUDID Inject 0.25-0.5 mLs (0.25-0.5 mg total) into the vein every 15 (fifteen) minutes as needed (Dyspnea, SOB, pain.  He is nearing EOL.).   isosorbide mononitrate 30 MG 24 hr tablet Commonly known as: IMDUR Take 30 mg by mouth daily.   LORazepam 2 MG/ML injection Commonly known as: ATIVAN Inject 0.13-0.25 mLs (0.26-0.5 mg total) into the vein every 6 (six) hours as needed for anxiety or sedation.   magnesium oxide 400 MG tablet Commonly known as: MAG-OX Take 1 tablet (400 mg total) by mouth 2 (two) times daily.   midodrine 5 MG tablet Commonly known as: PROAMATINE Take 1 tablet (5 mg total) by mouth 3 (three) times daily with meals.   montelukast 10 MG tablet Commonly  known as: SINGULAIR Take 1 tablet (10 mg total) by mouth at bedtime.   ondansetron 4 MG disintegrating tablet Commonly known as: ZOFRAN-ODT Take 1 tablet (4 mg total) by mouth every 6 (six) hours as needed for nausea.   ondansetron 4 MG/2ML Soln injection Commonly known as: ZOFRAN Inject 2 mLs (4 mg total) into the vein every 6 (six) hours as needed for nausea.   pantoprazole 40 MG tablet Commonly known as: PROTONIX Take 1 tablet (40 mg total) by mouth daily.   polyvinyl alcohol 1.4 % ophthalmic solution Commonly known as: LIQUIFILM TEARS Place 1 drop into both eyes 4 (four) times daily as needed for dry eyes.   torsemide 20 MG tablet Commonly known as: DEMADEX Take 2 tablets (40 mg total) by mouth 2 (two) times daily.  witch hazel-glycerin pad Commonly known as: TUCKS Apply topically as needed for itching.       Disposition:   Follow-up Information    [facility].   Specialty: Hospice and Palliative Medicine Why: [place] Contact information: [place] [phone] [phone]           Duration of Discharge Encounter: Greater than 30 minutes including physician time.  Jacalyn Lefevre, PA-C  02/01/2020 10:58 AM  EP Attending  Patient seen and examined. Agree with the findings as note above. He will be discharged to [facility] of [place].   Mikle Bosworth.D.

## 2020-01-30 NOTE — Progress Notes (Signed)
Responded to  Spiritual Consult around issue of Rick Gilmore having chosen to go to a Hospice facility.  Met [name]' daughter, Rick Gilmore, in the room.  Rick Gilmore appeared glad to see me.  Initiated a relationship of care and concern for Rick Gilmore and his daughter.  Assisted Rick Gilmore in a review of the past several months which have led up to his decision to go to Hospice when he leaves the hospital.  Rick Gilmore clearly made this choice, and while it is his wish, it is sad for him to have reached this time in his life.  Identified [name]' spiritual strengths and validated his role as a father as evidence by the loving care he is receiving from his daughter.  Offered prayer and a blessing at bedside.  Rick Gilmore invited me back to see him if he is still here. Chaplain will re-visit as long as Rick Gilmore is here.   De Burrs Chaplain Resident

## 2020-01-30 NOTE — Progress Notes (Signed)
Palliative:  I met today at Mr. Chamberlin' bedside with his daughter and grandson. They are awaiting hospice bed availability and hoping this will be today. He has no complaints and is enjoying his time with his family. He is in good spirits. He has no complaints or needs at this time.   Daughter does report to me that he enjoys time with his family but by evening time he is very fatigued and exhausted. I am glad that he is reserving his energy to spend quality time with his family and will continue to support this.   All questions/concerns addressed. Emotional support provided.   No charge  Vinie Sill, NP Palliative Medicine Team Pager [phone] (Please see [email or web address] for schedule) Team Phone [phone]    Greater than 50%  of this time was spent counseling and coordinating care related to the above assessment and plan

## 2020-01-30 NOTE — Progress Notes (Addendum)
Electrophysiology Rounding Note  Patient Name: Rick Gilmore Date of Encounter: 01/30/2020  Electrophysiologist: Dr. Lovena Le   Subjective   No new complaints. Comfortable. His UOP has picked back up and he had a BM.   Awaiting hospice bed.   Inpatient Medications    Scheduled Meds: . allopurinol  100 mg Oral Daily  . amiodarone  200 mg Oral Daily  . Chlorhexidine Gluconate Cloth  6 each Topical Daily  . escitalopram  10 mg Oral Daily  . fluticasone furoate-vilanterol  1 puff Inhalation Daily  . folic acid  1 mg Oral Daily  . influenza vaccine adjuvanted  0.5 mL Intramuscular Tomorrow-1000  . magnesium oxide  400 mg Oral Daily  . mouth rinse  15 mL Mouth Rinse BID  . midodrine  5 mg Oral TID WC  . montelukast  10 mg Oral QHS  . pantoprazole  40 mg Oral Daily  . pneumococcal 23 valent vaccine  0.5 mL Intramuscular Tomorrow-1000  . pravastatin  80 mg Oral q1800  . sodium chloride flush  10-40 mL Intracatheter Q12H  . torsemide  40 mg Oral BID  . umeclidinium bromide  1 puff Inhalation Daily  . vitamin B-12  1,000 mcg Oral q morning - 10a   Continuous Infusions: . norepinephrine (LEVOPHED) Adult infusion 2 mcg/min (01/29/20 0500)   PRN Meds: acetaminophen **OR** acetaminophen, albuterol, antiseptic oral rinse, glycopyrrolate **OR** glycopyrrolate **OR** glycopyrrolate, haloperidol **OR** haloperidol **OR** haloperidol lactate, HYDROmorphone (DILAUDID) injection, LORazepam, ondansetron **OR** ondansetron (ZOFRAN) IV, polyvinyl alcohol, sodium chloride flush   Vital Signs    Vitals:   01/29/20 0900 01/29/20 1427 01/29/20 2152 01/30/20 0506  BP: (!) 90/46 (!) 94/54  (!) 87/54  Pulse:  69  72  Resp: 17 17  16   Temp:  97.7 F (36.5 C)  98.2 F (36.8 C)  TempSrc:  Oral  Oral  SpO2: 100% 92% 91% 96%  Weight:      Height:        Intake/Output Summary (Last 24 hours) at 01/30/2020 0755 Last data filed at 01/30/2020 0507 Gross per 24 hour  Intake 540 ml  Output 625 ml    Net -85 ml   Filed Weights   01/23/20 2316 01/24/20 0016 01/25/20 0530  Weight: 100.1 kg 100.1 kg 99.6 kg    Physical Exam    GEN- The patient is chronically ill and elderly appearing, alert and oriented x 3 today.   Head- normocephalic, atraumatic Eyes-  Sclera clear, conjunctiva pink Ears- hearing intact Oropharynx- clear Neck- supple Lungs- Diminished bilaterally, normal work of breathing Heart- Regular rate and rhythm, no murmurs, rubs or gallops GI- soft, NT, ND, + BS Extremities- no clubbing or cyanosis. Bilteral 1+ edema Skin- no rash or lesion Psych- euthymic mood, full affect Neuro- strength and sensation are intact  Labs    CBC Recent Labs    01/28/20 0356  WBC 11.2*  NEUTROABS 9.2*  HGB 7.9*  HCT 24.6*  MCV 91.4  PLT 616   Basic Metabolic Panel Recent Labs    01/28/20 0356  NA 131*  K 3.8  CL 91*  CO2 25  GLUCOSE 129*  BUN 66*  CREATININE 4.05*  CALCIUM 8.5*   Liver Function Tests No results for input(s): AST, ALT, ALKPHOS, BILITOT, PROT, ALBUMIN in the last 72 hours. No results for input(s): LIPASE, AMYLASE in the last 72 hours. Cardiac Enzymes No results for input(s): CKTOTAL, CKMB, CKMBINDEX, TROPONINI in the last 72 hours. BNP Invalid input(s): POCBNP D-Dimer No  results for input(s): DDIMER in the last 72 hours. Hemoglobin A1C No results for input(s): HGBA1C in the last 72 hours. Fasting Lipid Panel No results for input(s): CHOL, HDL, LDLCALC, TRIG, CHOLHDL, LDLDIRECT in the last 72 hours. Thyroid Function Tests No results for input(s): TSH, T4TOTAL, T3FREE, THYROIDAB in the last 72 hours.  Invalid input(s): [facility]  Radiology    No results found.   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 CHF team involved and pt started on inotrope supports.  Assessment & Plan    1. VT(139bpm) Quiescent on amiodarone ICD therapies turned off.  Pt now DNR and awaiting hospice bed.   2. Hypokalemia No further labs with hospice care  3. Acute on chronic systolic CHF UOP picking back up.  Continue po diuretics for comfort.   4. AFib is described as permanent Continue eliquis.  Rates controlled prior to d/c tele.   5. AKI on CKI Suspect ATN in setting of prolonged VT and CHF His UOP is improving.   6. CAD Denies ischemic symptoms.   7. Disposition Awaiting bed at [facility]  For questions or updates, please contact [place] Please consult [email or web address] for contact info under Cardiology/STEMI.  Signed, Shirley Friar, PA-C  01/30/2020, 7:55 AM   EP Attending  Patient seen and examined. Agree with the findings as noted above. The patient is doing well and denies chest pain or sob. UOP appears improved. He has had a BM as well. Await transfer to hospice.   Mikle Bosworth.D.

## 2020-01-30 NOTE — Progress Notes (Signed)
Manufacturing engineer [facility]) Hospital Liaison Note:  Unfortunately, [place] does not have a bed available today. Family and CM/TOC updated.   An [place] will follow up this afternoon if anything changes and tomorrow morning to check in about bed availability.   Please feel free to call with hospice related concerns or questions,  Gar Ponto, RN [facility] (in [place]) [phone]

## 2020-01-30 NOTE — Progress Notes (Signed)
Patient has his home CPAP and is able to use as needed.  Patient is familiar with equipment and procedure and machine is within reach.

## 2020-01-31 NOTE — Progress Notes (Deleted)
SATURATION QUALIFICATIONS: (This note is used to comply with regulatory documentation for home oxygen)  Patient Saturations on Room Air at Rest = 93%  Patient Saturations on Room Air while Ambulating = 91-93%  Patient Saturations on 0 Liters of oxygen while Ambulating =   Please briefly explain why patient needs home oxygen:  Patient did not desaturate while ambulating. She walked from bed in room to nurses station and back. Patient tolerated well. Was able to converse while ambulating with no SOB.  Lowest Spo2 recorded was 91%RA.

## 2020-01-31 NOTE — Progress Notes (Addendum)
Electrophysiology Rounding Note  Patient Name: Rick Gilmore Date of Encounter: 01/31/2020  Primary Cardiologist: No primary care provider on file. Electrophysiologist: Dr. Lovena Le   Subjective   The patient is comfortable this am. No complaints at this time. Aware that comfort meds are available as needed.   Inpatient Medications    Scheduled Meds:  amiodarone  200 mg Oral Daily   Chlorhexidine Gluconate Cloth  6 each Topical Daily   escitalopram  10 mg Oral Daily   fluticasone furoate-vilanterol  1 puff Inhalation Daily   folic acid  1 mg Oral Daily   influenza vaccine adjuvanted  0.5 mL Intramuscular Tomorrow-1000   magnesium oxide  400 mg Oral Daily   mouth rinse  15 mL Mouth Rinse BID   midodrine  5 mg Oral TID WC   montelukast  10 mg Oral QHS   pantoprazole  40 mg Oral Daily   pneumococcal 23 valent vaccine  0.5 mL Intramuscular Tomorrow-1000   sodium chloride flush  10-40 mL Intracatheter Q12H   torsemide  40 mg Oral BID   umeclidinium bromide  1 puff Inhalation Daily   Continuous Infusions:   PRN Meds: acetaminophen **OR** acetaminophen, albuterol, antiseptic oral rinse, glycopyrrolate **OR** glycopyrrolate **OR** glycopyrrolate, haloperidol **OR** haloperidol **OR** haloperidol lactate, HYDROmorphone (DILAUDID) injection, LORazepam, ondansetron **OR** ondansetron (ZOFRAN) IV, polyvinyl alcohol, sodium chloride flush, witch hazel-glycerin   Vital Signs    Vitals:   01/29/20 2152 01/30/20 0506 01/30/20 1350 01/31/20 0655  BP:  (!) 87/54 (!) 99/49 (!) 112/56  Pulse:  72 70 70  Resp:  16  17  Temp:  98.2 F (36.8 C) 97.9 F (36.6 C) 97.7 F (36.5 C)  TempSrc:  Oral Oral Oral  SpO2: 91% 96% 93% 93%  Weight:      Height:        Intake/Output Summary (Last 24 hours) at 01/31/2020 0810 Last data filed at 01/30/2020 1057 Gross per 24 hour  Intake --  Output 125 ml  Net -125 ml   Filed Weights   01/23/20 2316 01/24/20 0016 01/25/20 0530  Weight: 100.1 kg  100.1 kg 99.6 kg    Physical Exam    GEN- The patient is well appearing, alert and oriented x 3 today.   Head- normocephalic, atraumatic Eyes-  Sclera clear, conjunctiva pink Ears- hearing intact Oropharynx- clear Neck- supple Lungs- Clear to ausculation bilaterally, normal work of breathing Heart- Regular rate and rhythm, no murmurs, rubs or gallops GI- soft, NT, ND, + BS Extremities- no clubbing, cyanosis, or edema Skin- no rash or lesion Psych- euthymic mood, full affect Neuro- strength and sensation are intact  Labs    CBC No results for input(s): WBC, NEUTROABS, HGB, HCT, MCV, PLT in the last 72 hours. Basic Metabolic Panel No results for input(s): NA, K, CL, CO2, GLUCOSE, BUN, CREATININE, CALCIUM, MG, PHOS in the last 72 hours. Liver Function Tests No results for input(s): AST, ALT, ALKPHOS, BILITOT, PROT, ALBUMIN in the last 72 hours. No results for input(s): LIPASE, AMYLASE in the last 72 hours. Cardiac Enzymes No results for input(s): CKTOTAL, CKMB, CKMBINDEX, TROPONINI in the last 72 hours.   Telemetry    [name] (personally reviewed)  Radiology    No results found.  Patient Profile     Rick Gilmore is a 79 y.o. 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 CHF team involved and pt started on inotrope supports.  Pt transitioned to hospice care with progressive renal dysfunction despite inotrope support.   Assessment & Plan    1. VT (139bpm) No symptomatic VT; Tele d/c and ICD therapies off.  Continue po amiodarone.  Pt now DNR and awaiting hospice bed.    2. Hypokalemia No labs with hospice care.    3. Acute on chronic systolic CHF UOP stabilized Continue po diuretics for comfort.    4. AFib is  described as permanent Continue eliquis for CHA2DS2VASC of at least 5    Rates controlled prior to d/c tele.    5. AKI on CKI  Likely ATN in setting of prolonged VR and CHF.  His UOP is improving.    6. CAD Denies ischemic symptoms.    7. Disposition Awaiting bed at [facility]. Appreciate assistance of CSW and Palliative care team.   For questions or updates, please contact [place] Please consult [email or web address] for contact info under Cardiology/STEMI.  Signed, Shirley Friar, PA-C  01/31/2020, 8:10 AM   EP Attending  Patient seen and examined. Agree with the findings as noted above. The patient is stable and awaiting transfer to Hospice in [place].  Mikle Bosworth.D.

## 2020-01-31 NOTE — Social Work (Signed)
CSW spoke with [facility] liaison Wendy Poet. At this time there is no bed at [facility]. She spoke with pt daughter and they are currently declining [employer] [facility] (also no bed) and not open to hospice at home. TOC team has discussed continued support from Palliative Medicine Team, as well, with Vinie Sill who visited with pt yesterday.   Westley Hummer, MSW, [place] Work

## 2020-01-31 NOTE — Progress Notes (Signed)
Manufacturing engineer [facility]) Hospital Liaison Note:  Unfortunately, [place] does not have a bed available today. Family and CM/TOC updated.   An [place] will follow up this afternoon if anything changes and tomorrow morning to check in about bed availability.   Please feel free to call with hospice related concerns or questions,  Thank you, Margaretmary Eddy, BSN, RN [place] [phone]  [place] are on [place]

## 2020-01-31 NOTE — Progress Notes (Signed)
Pt states no assistance needed with home CPAP at this time. RT will continue to monitor.

## 2020-01-31 NOTE — Plan of Care (Signed)
  Problem: Cardiac: Goal: Ability to achieve and maintain adequate cardiopulmonary perfusion will improve Outcome: Progressing   Problem: Clinical Measurements: Goal: Diagnostic test results will improve Outcome: Progressing   Problem: Activity: Goal: Risk for activity intolerance will decrease Outcome: Progressing   Problem: Coping: Goal: Level of anxiety will decrease Outcome: Progressing   Problem: Pain Managment: Goal: General experience of comfort will improve Outcome: Progressing   Problem: Safety: Goal: Ability to remain free from injury will improve Outcome: Progressing

## 2020-02-01 MED ORDER — ONDANSETRON HCL 4 MG/2ML IJ SOLN: 4.0000 mg | Freq: Four times a day (QID) | INTRAMUSCULAR | 0 refills | Status: AC | PRN

## 2020-02-01 MED ORDER — WITCH HAZEL-GLYCERIN EX PADS: MEDICATED_PAD | CUTANEOUS | 12 refills | Status: AC | PRN

## 2020-02-01 MED ORDER — MIDODRINE HCL 5 MG PO TABS: 5.0000 mg | ORAL_TABLET | Freq: Three times a day (TID) | ORAL | Status: AC

## 2020-02-01 MED ORDER — ORAL CARE MOUTH RINSE: 15.0000 mL | Freq: Two times a day (BID) | OROMUCOSAL | 0 refills | Status: AC

## 2020-02-01 MED ORDER — LORAZEPAM 2 MG/ML IJ SOLN: 0.2500 mg | Freq: Four times a day (QID) | INTRAMUSCULAR | 0 refills | Status: AC | PRN

## 2020-02-01 MED ORDER — ACETAMINOPHEN 650 MG RE SUPP: 650.0000 mg | Freq: Four times a day (QID) | RECTAL | 0 refills | Status: AC | PRN

## 2020-02-01 MED ORDER — BIOTENE DRY MOUTH MT LIQD: 15.0000 mL | OROMUCOSAL | Status: AC | PRN

## 2020-02-01 MED ORDER — HALOPERIDOL LACTATE 5 MG/ML IJ SOLN: 0.5000 mg | INTRAMUSCULAR | Status: AC | PRN

## 2020-02-01 MED ORDER — HALOPERIDOL 0.5 MG PO TABS: 0.5000 mg | ORAL_TABLET | ORAL | Status: AC | PRN

## 2020-02-01 MED ORDER — HALOPERIDOL LACTATE 2 MG/ML PO CONC: 0.5000 mg | ORAL | 0 refills | Status: AC | PRN

## 2020-02-01 MED ORDER — HYDROMORPHONE HCL 1 MG/ML IJ SOLN: 0.2500 mg | INTRAMUSCULAR | 0 refills | Status: AC | PRN

## 2020-02-01 MED ORDER — POLYVINYL ALCOHOL 1.4 % OP SOLN: 1.0000 [drp] | Freq: Four times a day (QID) | OPHTHALMIC | 0 refills | Status: AC | PRN

## 2020-02-01 MED ORDER — ONDANSETRON 4 MG PO TBDP: 4.0000 mg | ORAL_TABLET | Freq: Four times a day (QID) | ORAL | 0 refills | Status: AC | PRN

## 2020-02-01 MED ORDER — ACETAMINOPHEN 325 MG PO TABS: 650.0000 mg | ORAL_TABLET | Freq: Four times a day (QID) | ORAL | Status: AC | PRN

## 2020-02-01 MED ORDER — GLYCOPYRROLATE 1 MG PO TABS: 1.0000 mg | ORAL_TABLET | ORAL | Status: AC | PRN

## 2020-02-01 NOTE — Progress Notes (Signed)
Patient just left [facility] via [facility] and family members following patient.  Discharge packet handed over to [facility] staff, patient premedicated with 0.5 mg Dilaudid IV prior to transport.  Family members verbalized understanding of discharge instructions.

## 2020-02-01 NOTE — Social Work (Signed)
Clinical Social Worker facilitated patient discharge including contacting patient family and facility to confirm patient discharge plans.  Clinical information faxed to facility and family agreeable with plan.  CSW arranged ambulance transport via [facility] to [facility] RN to call [phone]  with report prior to discharge.  Clinical Social Worker will sign off for now as social work intervention is no longer needed. Please consult [place] again if new need arises.  Westley Hummer, MSW, LCSW Clinical Social Worker

## 2020-02-01 NOTE — Plan of Care (Signed)
Pt for discharge going to [facility] home, family at the bedside, alert and oriented, given Dilaudid 2x for shortness of breath with 02nc at 3 liters/hr, possbile discharge 1500.

## 2020-02-01 NOTE — TOC Transition Note (Signed)
Transition of Care [facility]) - CM/SW Discharge Note   Patient Details  Name: Rick Gilmore MRN: [id number] Date of Birth: 03/23/1941  [facility] of [facility]) CM/SW Contact:  Alexander Mt, LCSW Phone Number: 02/01/2020, 10:39 AM   Clinical Narrative:    Bed confirmed by [facility] for [facility].  Pt family has spoken with PMT provider Elmo Putt and [facility] liaison [place] about bed. Family will complete paperwork. Secure message sent to Shirley Friar with electrophysiology requested discharge summary and signed DNR.    Final next level of care: [place] Barriers to Discharge: Barriers Resolved   Patient Goals and CMS Choice Patient states their goals for this hospitalization and ongoing recovery are:: "to go to residential hospice facility" CMS Medicare.gov Compare Post Acute Care list provided to:: (Patient had spoken to palliative Nurse Practitioner- and patient spoke with nurse practitioner and expressed that he wants [facility].)  Discharge Placement  Patient chooses bed at: Other - please specify in the comment section below:([facility]) Patient to be transferred to facility by: [place] Name of family member notified: pt daughter to complete paperwork Patient and family notified of of transfer: 02/01/20  Discharge Plan and Services In-house Referral: NA Discharge Planning Services: CM Consult Post Acute Care Choice: Hospice           [facility] Agency: [facility]osp[facility] and [place] for Residential Facility.) Date [facility] Agency Contacted: 01/28/20 Time [employer]: 1321 Representative spoke with at [place]: Bradly Chris  Readmission Risk Interventions Readmission Risk Prevention Plan 01/28/2020 10/01/2019 09/11/2019  Transportation Screening Complete Complete Complete  PCP or Specialist Appt within 3-5 Days - Complete Complete  HRI or [place] -  Complete Complete  Social Work Consult for [place] Planning/Counseling - Complete Complete  Palliative Care Screening - Not Applicable Not Applicable  Medication Review Press photographer) Complete Complete Complete  HRI or Home Care Consult Complete - -  SW Recovery Care/Counseling Consult Complete - -  Palliative Care Screening Complete - -  [place] Not Applicable - -  Some recent data might be hidden

## 2020-02-01 NOTE — Progress Notes (Signed)
Pt for discharge going to [facility], report given to Cordelia Pen , RN, possible discharge at around 8pm, she said to keep the peripheral IV line, DNR, will give Dilaudid prior to discharge.

## 2020-02-01 NOTE — Progress Notes (Signed)
[facility] Documentation     Pt has been approved for [facility] transfer. [facility]. Paperwork has been completed and transportation can be arranged.      Please call [facility] at [phone] to give charge nurse report and fax discharge summary to [phone].     Please dc any lines. May leave catheter in place if pt has one. Please send pt  with DNR paperwork.      Please call with any questions.      Thank you,   Freddie Breech, RN

## 2020-02-01 NOTE — Progress Notes (Addendum)
Electrophysiology Rounding Note  Patient Name: Rick Gilmore Date of Encounter: 02/01/2020  Electrophysiologist: Dr. Lovena Le   Subjective   Pt not feeling well this am. Very fatigued, didn't sleep well.  He is more SOB this am with 6-7 word dyspnea. Still making urine.  Edema persists up into his thighs.  He is not sure he will "make it" to hospice house, and says he is at peace with this.   Inpatient Medications    Scheduled Meds: . amiodarone  200 mg Oral Daily  . Chlorhexidine Gluconate Cloth  6 each Topical Daily  . escitalopram  10 mg Oral Daily  . fluticasone furoate-vilanterol  1 puff Inhalation Daily  . folic acid  1 mg Oral Daily  . influenza vaccine adjuvanted  0.5 mL Intramuscular Tomorrow-1000  . magnesium oxide  400 mg Oral Daily  . mouth rinse  15 mL Mouth Rinse BID  . midodrine  5 mg Oral TID WC  . montelukast  10 mg Oral QHS  . pantoprazole  40 mg Oral Daily  . pneumococcal 23 valent vaccine  0.5 mL Intramuscular Tomorrow-1000  . sodium chloride flush  10-40 mL Intracatheter Q12H  . torsemide  40 mg Oral BID  . umeclidinium bromide  1 puff Inhalation Daily   Continuous Infusions:  PRN Meds: acetaminophen **OR** acetaminophen, albuterol, antiseptic oral rinse, glycopyrrolate **OR** glycopyrrolate **OR** glycopyrrolate, haloperidol **OR** haloperidol **OR** haloperidol lactate, HYDROmorphone (DILAUDID) injection, LORazepam, ondansetron **OR** ondansetron (ZOFRAN) IV, polyvinyl alcohol, sodium chloride flush, witch hazel-glycerin   Vital Signs    Vitals:   01/31/20 0944 01/31/20 1400 01/31/20 1417 02/01/20 0459  BP:   94/64 (!) 91/55  Pulse:   69 71  Resp:   20 20  Temp:   97.6 F (36.4 C) (!) 97.4 F (36.3 C)  TempSrc:   Oral Oral  SpO2: 93% 90% 98% 97%  Weight:      Height:        Intake/Output Summary (Last 24 hours) at 02/01/2020 0716 Last data filed at 02/01/2020 0500 Gross per 24 hour  Intake 600 ml  Output 500 ml  Net 100 ml   Filed  Weights   01/23/20 2316 01/24/20 0016 01/25/20 0530  Weight: 100.1 kg 100.1 kg 99.6 kg    Physical Exam    GEN- The patient is chronically ill and elderly appearing, alert and oriented x 3 today.   Head- normocephalic, atraumatic Eyes-  Sclera clear, conjunctiva pink Ears- hearing intact Oropharynx- clear Neck- supple Lungs- Diminished. 6-7 word conversation dyspnea.  Heart- Regular rate and rhythm, no murmurs, rubs or gallops GI- soft, NT, ND, + BS Extremities- no clubbing or cyanosis. 2+ dependent edema into thighs.  Skin- no rash or lesion Psych- flat but appropriate affect Neuro- strength and sensation are intact  Labs    CBC No results for input(s): WBC, NEUTROABS, HGB, HCT, MCV, PLT in the last 72 hours. Basic Metabolic Panel No results for input(s): NA, K, CL, CO2, GLUCOSE, BUN, CREATININE, CALCIUM, MG, PHOS in the last 72 hours. Liver Function Tests No results for input(s): AST, ALT, ALKPHOS, BILITOT, PROT, ALBUMIN in the last 72 hours. No results for input(s): LIPASE, AMYLASE in the last 72 hours. Cardiac Enzymes No results for input(s): CKTOTAL, CKMB, CKMBINDEX, TROPONINI in the last 72 hours.   Telemetry    [name] (personally reviewed)  Radiology    No results found.  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 CHF team involved and pt started on inotrope supports.  Pt transitioned to hospice care with progressive renal dysfunction despite inotrope support.   Assessment & Plan    1. VT(139bpm) No tele Denies tachypalpitations. ICD therapies off.  Continue po amiodarone.  Pt now DNR and awaiting hospice bed.   2. Hypokalemia No labs with hospice care.    3. Acute on chronic systolic CHF UOP OK.  Continue po diuretics for comfort.  Meds available for SOB.  4. AFib is described as permanent Continue eliquis for CHA2DS2VASC of at least 5  5. AKI on CKI Suspected ATN in setting of prolonged VR and CHF.   6. CAD No ischemic symptoms.   7. Disposition Awaiting bed at hospice house.   For questions or updates, please contact [place] Please consult [email or web address] for contact info under Cardiology/STEMI.  Signed, Shirley Friar, PA-C  02/01/2020, 7:16 AM   EP Attending  Patient seen and examined. Agree with the findings as noted above. He awaits transfer to hospice in [place].  Rick Gilmore.D.

## 2020-02-01 NOTE — Progress Notes (Signed)
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 Has had 4 admissions in 6 months. He has decided to focus on comfort care.   I met again today at Rick Gilmore' bedside with he and his daughter. Hospice facility has bed available today but there was question of his stability to transfer. He did have a reported episode of respiratory distress this morning but this was relieved by prn medication. He is alert and interactive today as he was yesterday and without any distress. Daughter is at bedside and verifies their desire to move forward to transition to hospice facility. She has just come to bedside and was concerned because she received an upsetting call during Rick Gilmore' distress this morning. She is happy to see his symptoms are relieved now. They have no further questions or concerns. Emotional support provided.   Exam: Alert, oriented. Fatigued. No distress. Breathing regular, unlabored.   Plan: - Plans for continued comfort care at hospice facility - to transfer today.   15 min  Rick Sill, NP Palliative Medicine Team Pager [phone] (Please see [email or web address] for schedule) Team Phone [phone]    Greater than 50%  of this time was spent counseling and coordinating care related to the above assessment and plan

## 2020-02-04 ENCOUNTER — Ambulatory Visit: Payer: Medicare Other

## 2020-02-12 ENCOUNTER — Telehealth: Payer: Self-pay | Admitting: Internal Medicine

## 2020-02-12 NOTE — Telephone Encounter (Signed)
Patient's death certificate is up front in [place]'s color folder.

## 2020-02-12 NOTE — Telephone Encounter (Signed)
Placed on Dr. Lupita Dawn desk for signature.

## 2020-02-19 ENCOUNTER — Ambulatory Visit: Payer: Medicare Other | Admitting: Internal Medicine

## 2020-02-20 ENCOUNTER — Inpatient Hospital Stay: Payer: Medicare Other

## 2020-02-26 ENCOUNTER — Ambulatory Visit: Payer: Medicare Other | Admitting: Family

## 2020-03-03 ENCOUNTER — Ambulatory Visit: Payer: Medicare Other | Admitting: Cardiovascular Disease

## 2020-03-06 DEATH — deceased

## 2020-03-17 ENCOUNTER — Ambulatory Visit: Payer: Medicare Other

## 2020-03-27 ENCOUNTER — Ambulatory Visit: Payer: Medicare Other

## 2020-03-27 ENCOUNTER — Other Ambulatory Visit: Payer: Medicare Other

## 2020-03-27 ENCOUNTER — Ambulatory Visit: Payer: Medicare Other | Admitting: Oncology

## 2020-09-20 IMAGING — DX DG CHEST 2V
3 series · 3 of 3 positions shown · non-contrast
Comparison: 05/05/2017

CLINICAL DATA: Cough with phlegm

EXAM:
CHEST - 2 VIEW

[chest pa (1 of 2)]
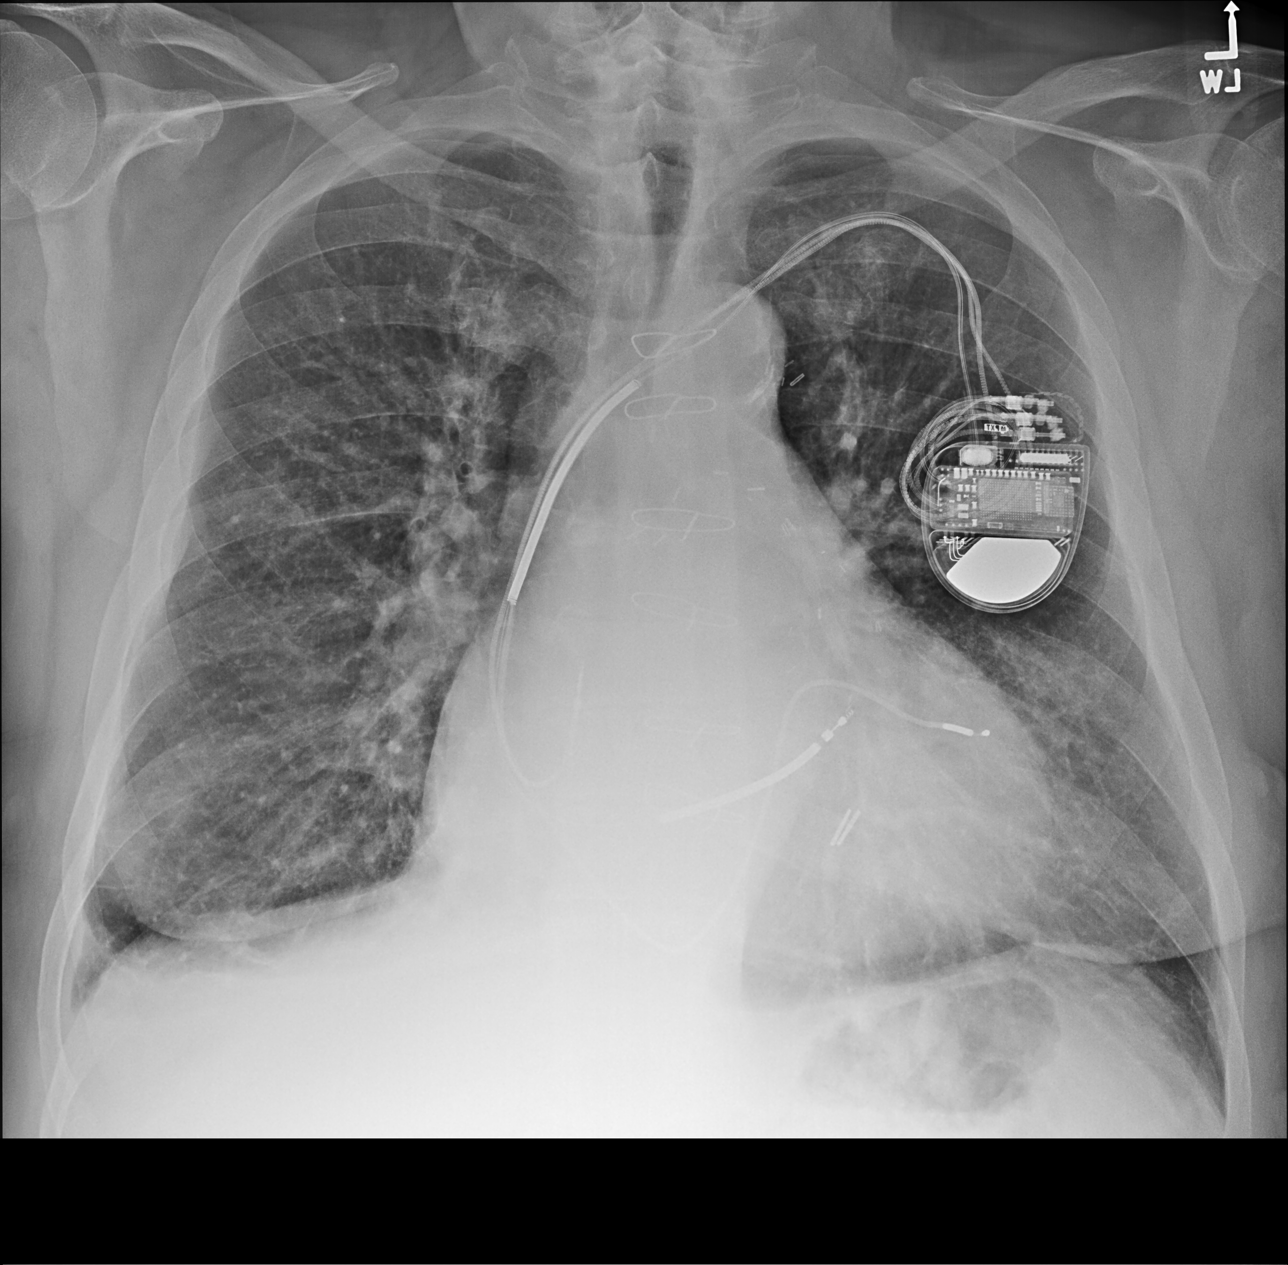

[chest lat]
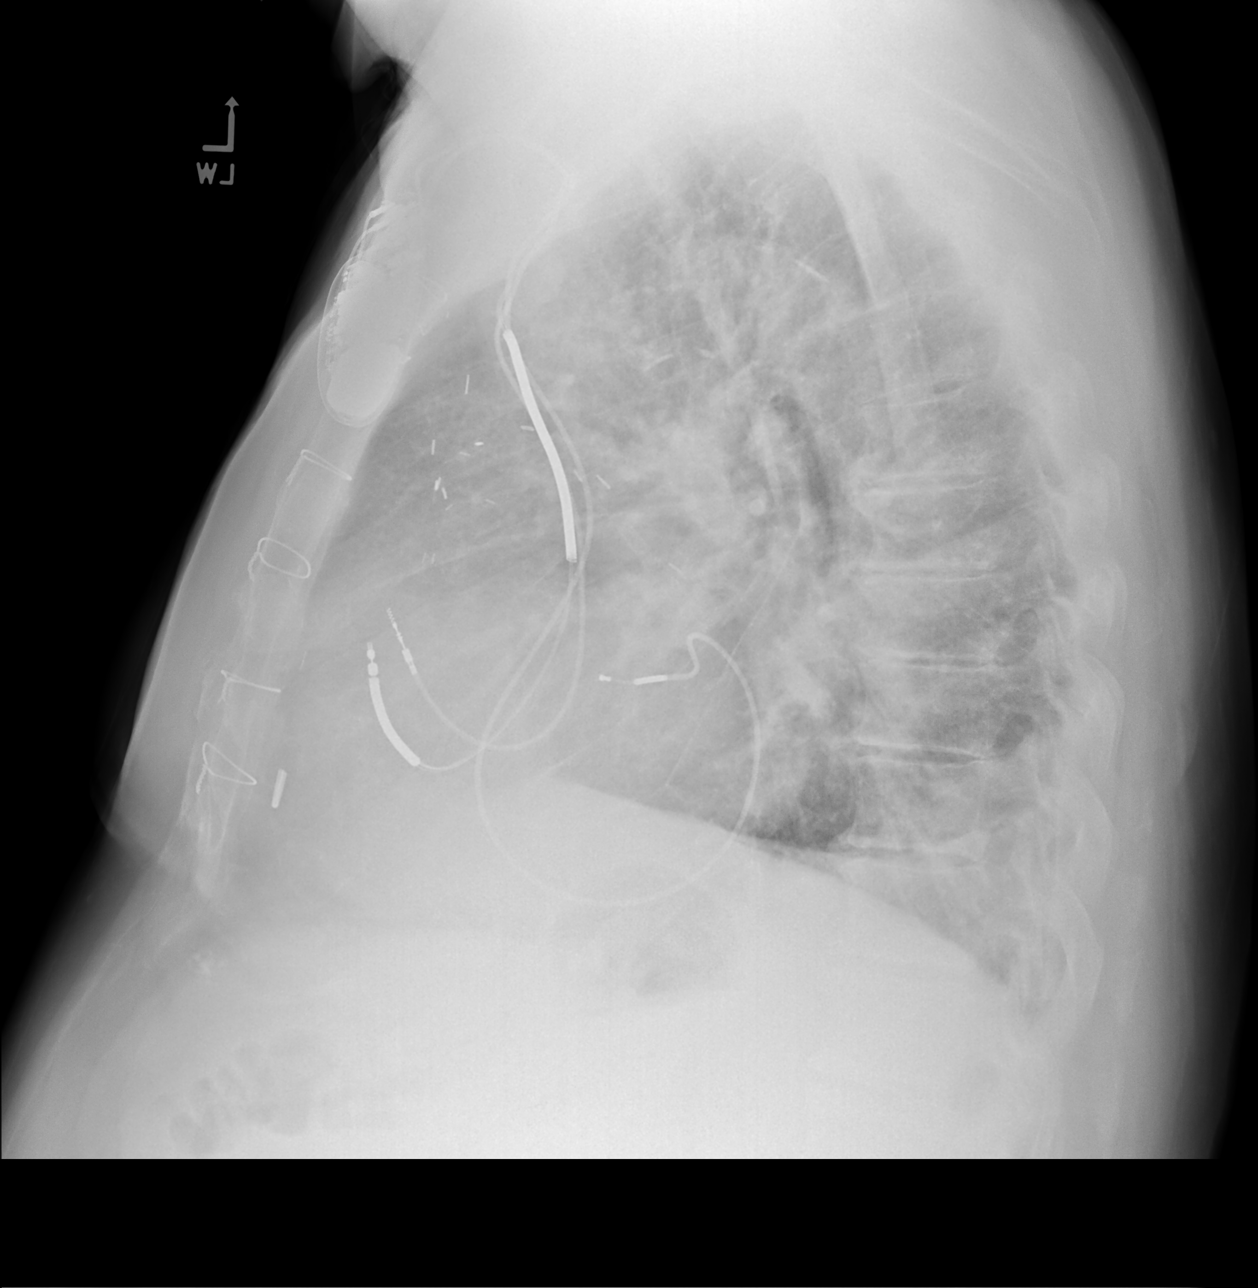

[chest pa (2 of 2)]
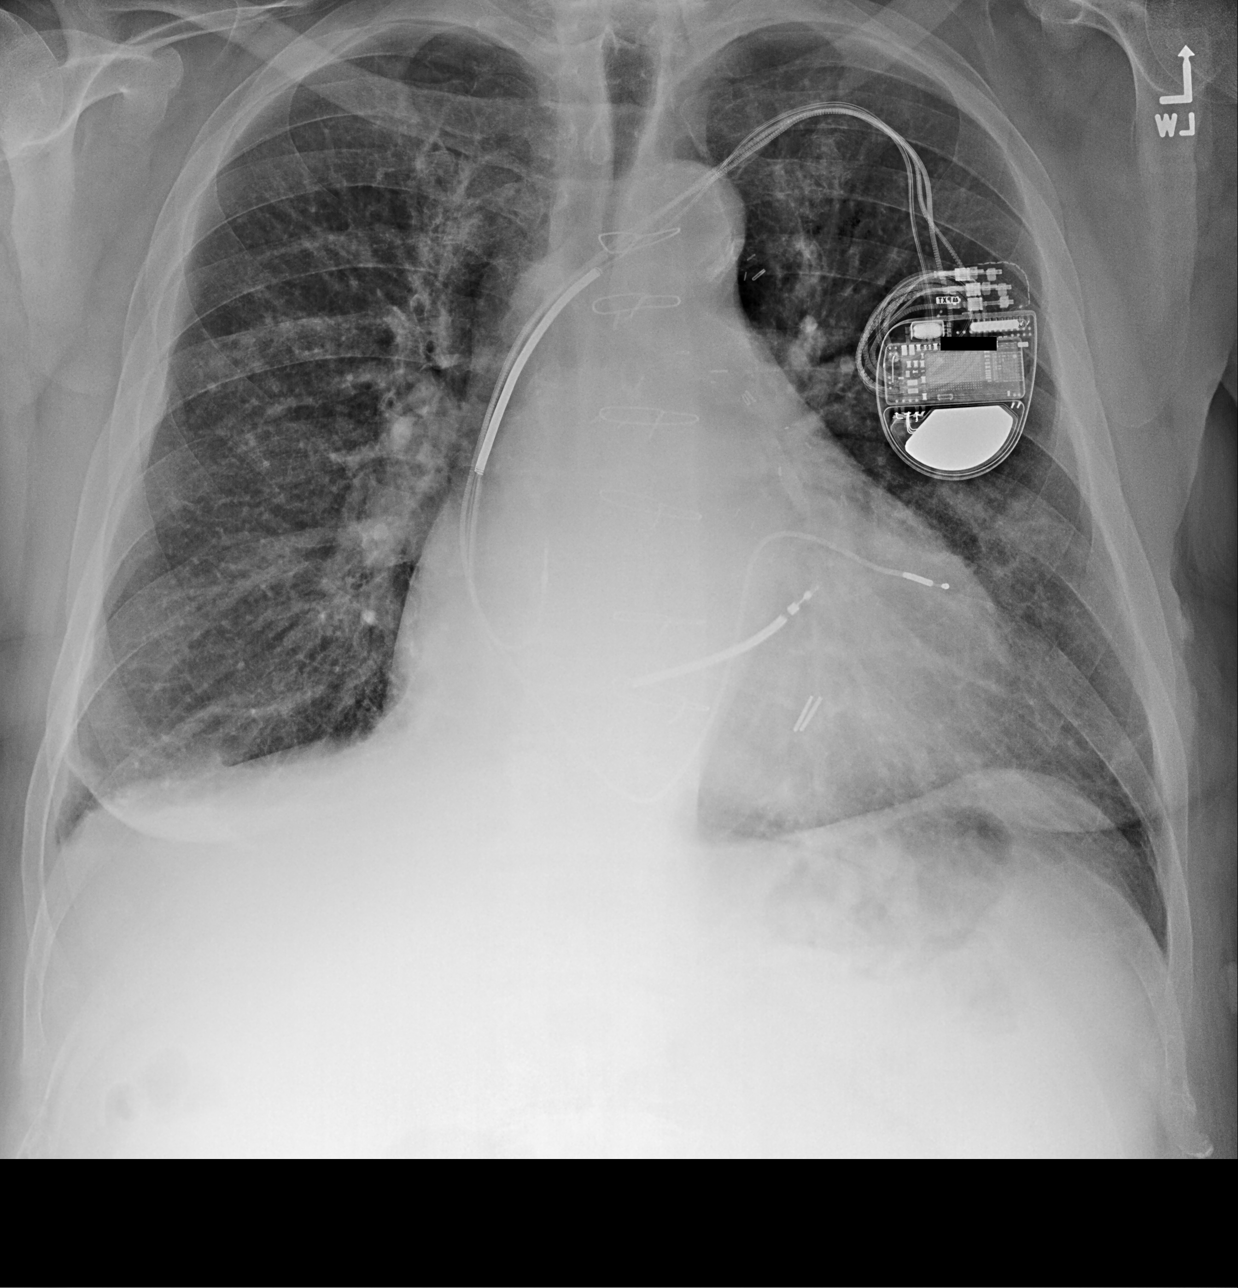

[3 of 3 positions shown; findings below may reference images not displayed]

FINDINGS: Left subclavian AICD device is stable. Leads are intact. The heart
is mildly enlarged. Normal vascularity. Subtle Kerley B-lines at the
right lung base are noted. No pneumothorax. No consolidation or lung
mass. Small right pleural effusion.
IMPRESSION: Cardiomegaly with minimal interstitial edema at the right lung base
and small right pleural effusion.

## 2021-05-04 IMAGING — DX DG CHEST 1V PORT
1 series · 1 of 1 positions shown · non-contrast
Comparison: 12/26/2018 and earlier, including CT chest 10/07/2014.

CLINICAL DATA: Acute onset of shortness of breath, diaphoresis and
nausea while at home earlier today. Current history of CHF. Prior
CABG.

EXAM:
PORTABLE CHEST 1 VIEW

[chest ap]
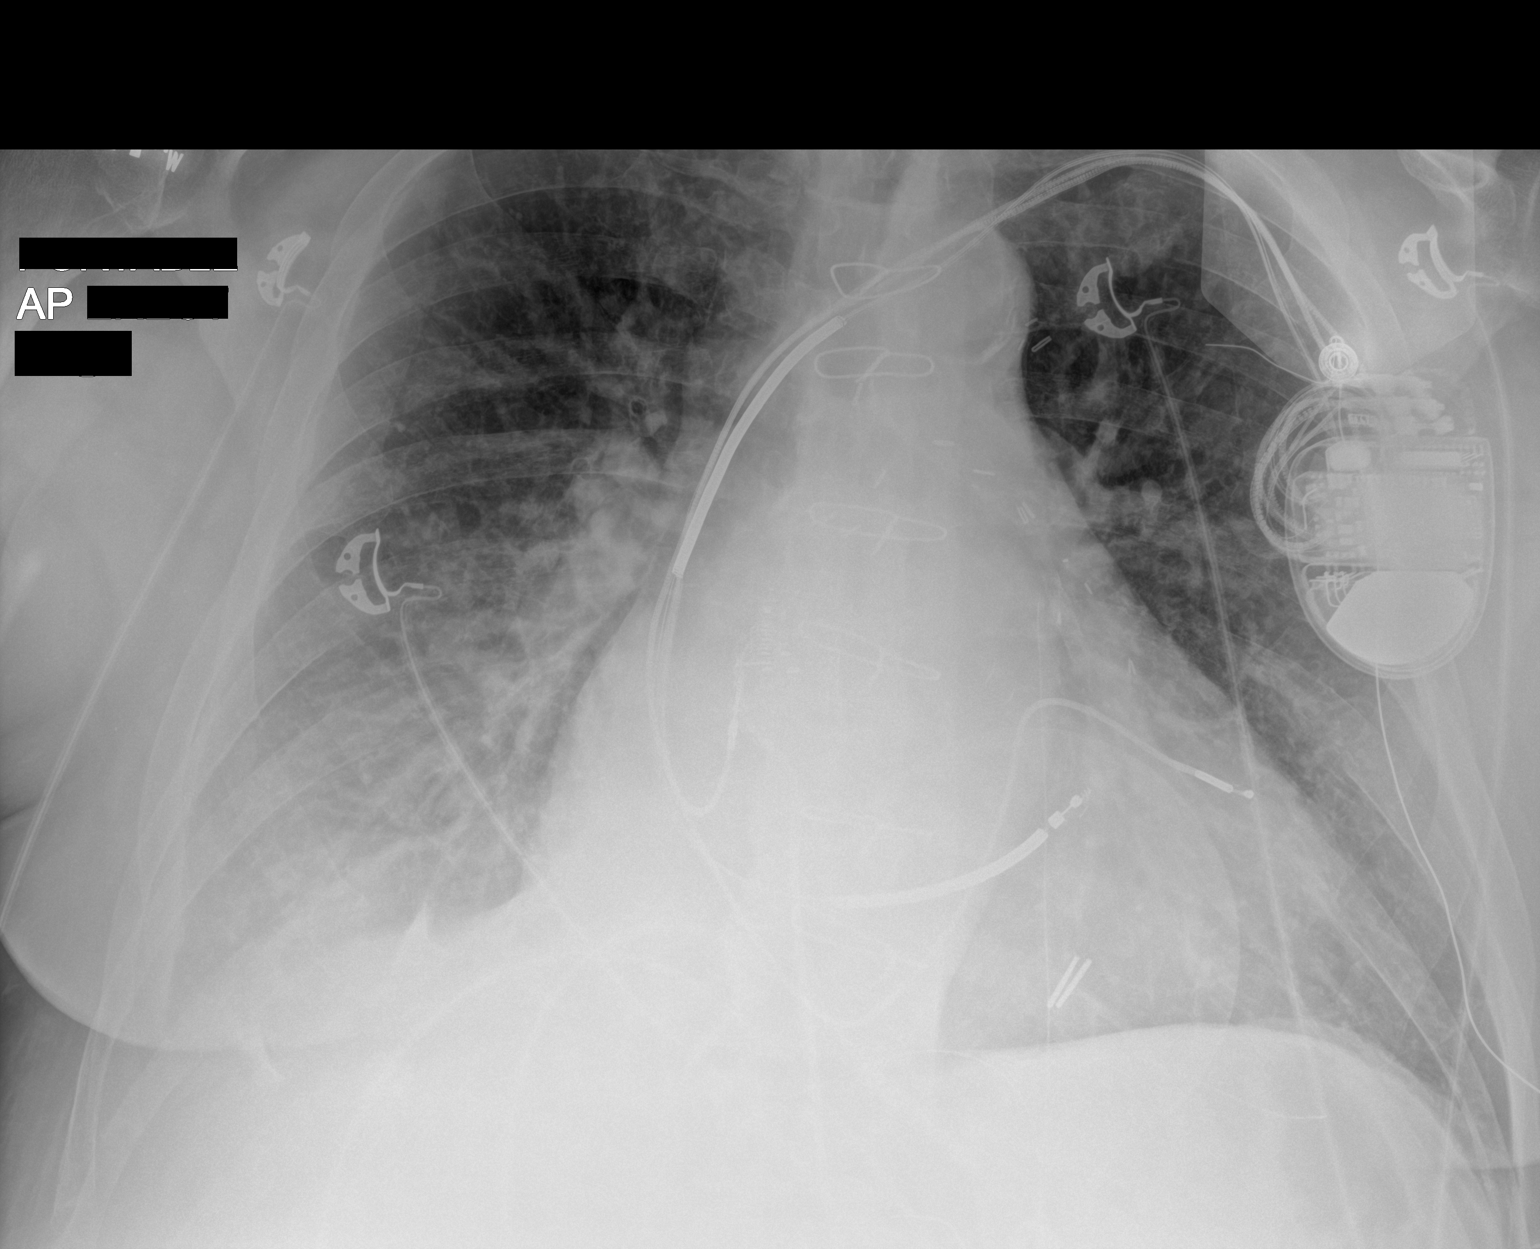

[1 of 1 positions shown; findings below may reference images not displayed]

FINDINGS: Prior sternotomy for CABG. LEFT subclavian biventricular pacing
defibrillator, unchanged. Cardiac silhouette moderately to markedly
enlarged, unchanged. Pulmonary venous hypertension and minimal to
mild interstitial pulmonary edema. Small RIGHT pleural effusion. No
confluent airspace consolidation.
IMPRESSION: Minimal to mild CHF, with stable moderate to marked cardiomegaly and
minimal to mild diffuse interstitial pulmonary edema. Small RIGHT
pleural effusion.

## 2021-07-22 IMAGING — CR DG CHEST 2V
1 series · 2 of 2 positions shown · non-contrast
Comparison: 09/30/2019

CLINICAL DATA: Shortness of breath, bleeding defibrillator site,
ischemic cardiomyopathy, hypertension

EXAM:
CHEST - 2 VIEW

[Series 1: dg chest 2 view · 0.14mm/px · 2 of 2 slices shown]
[im 1/2]
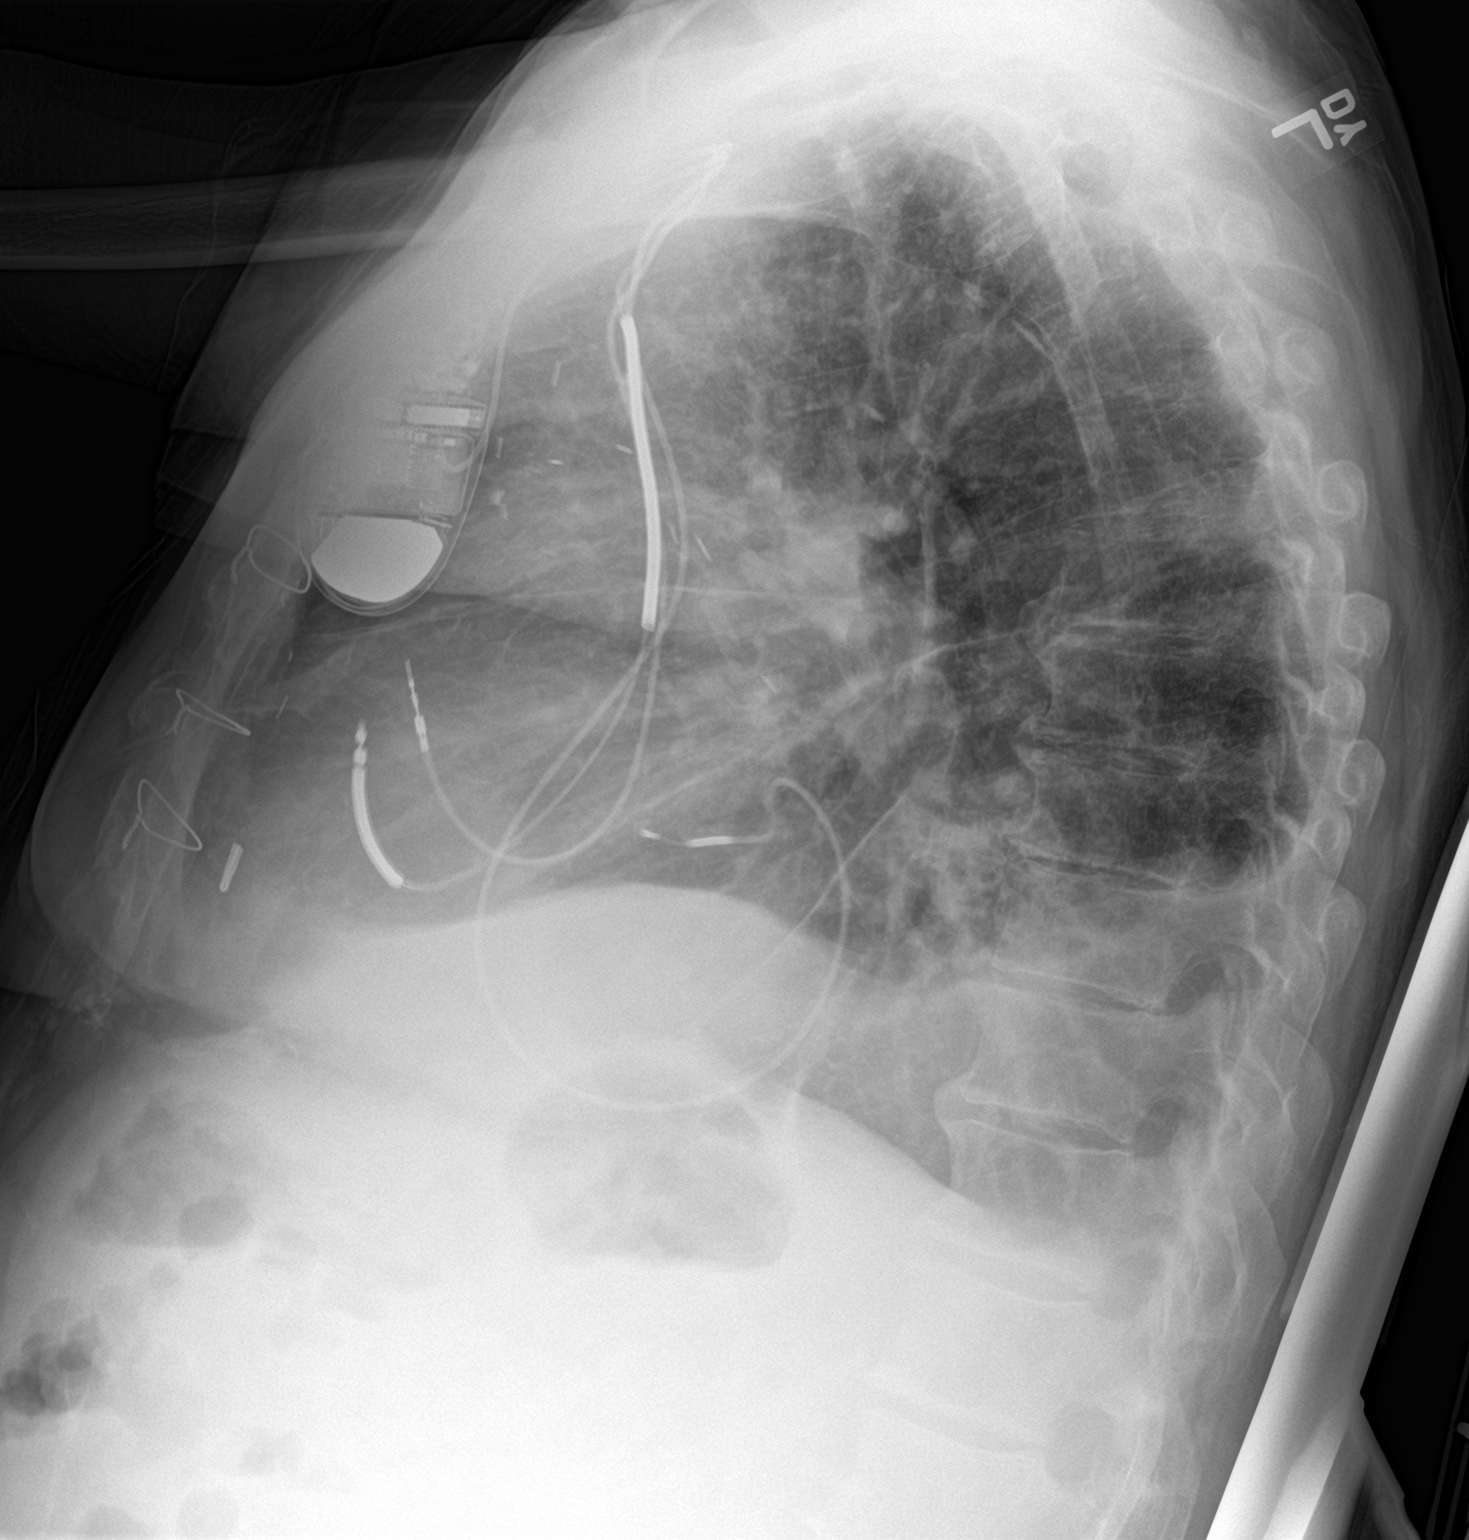
[im 2/2]
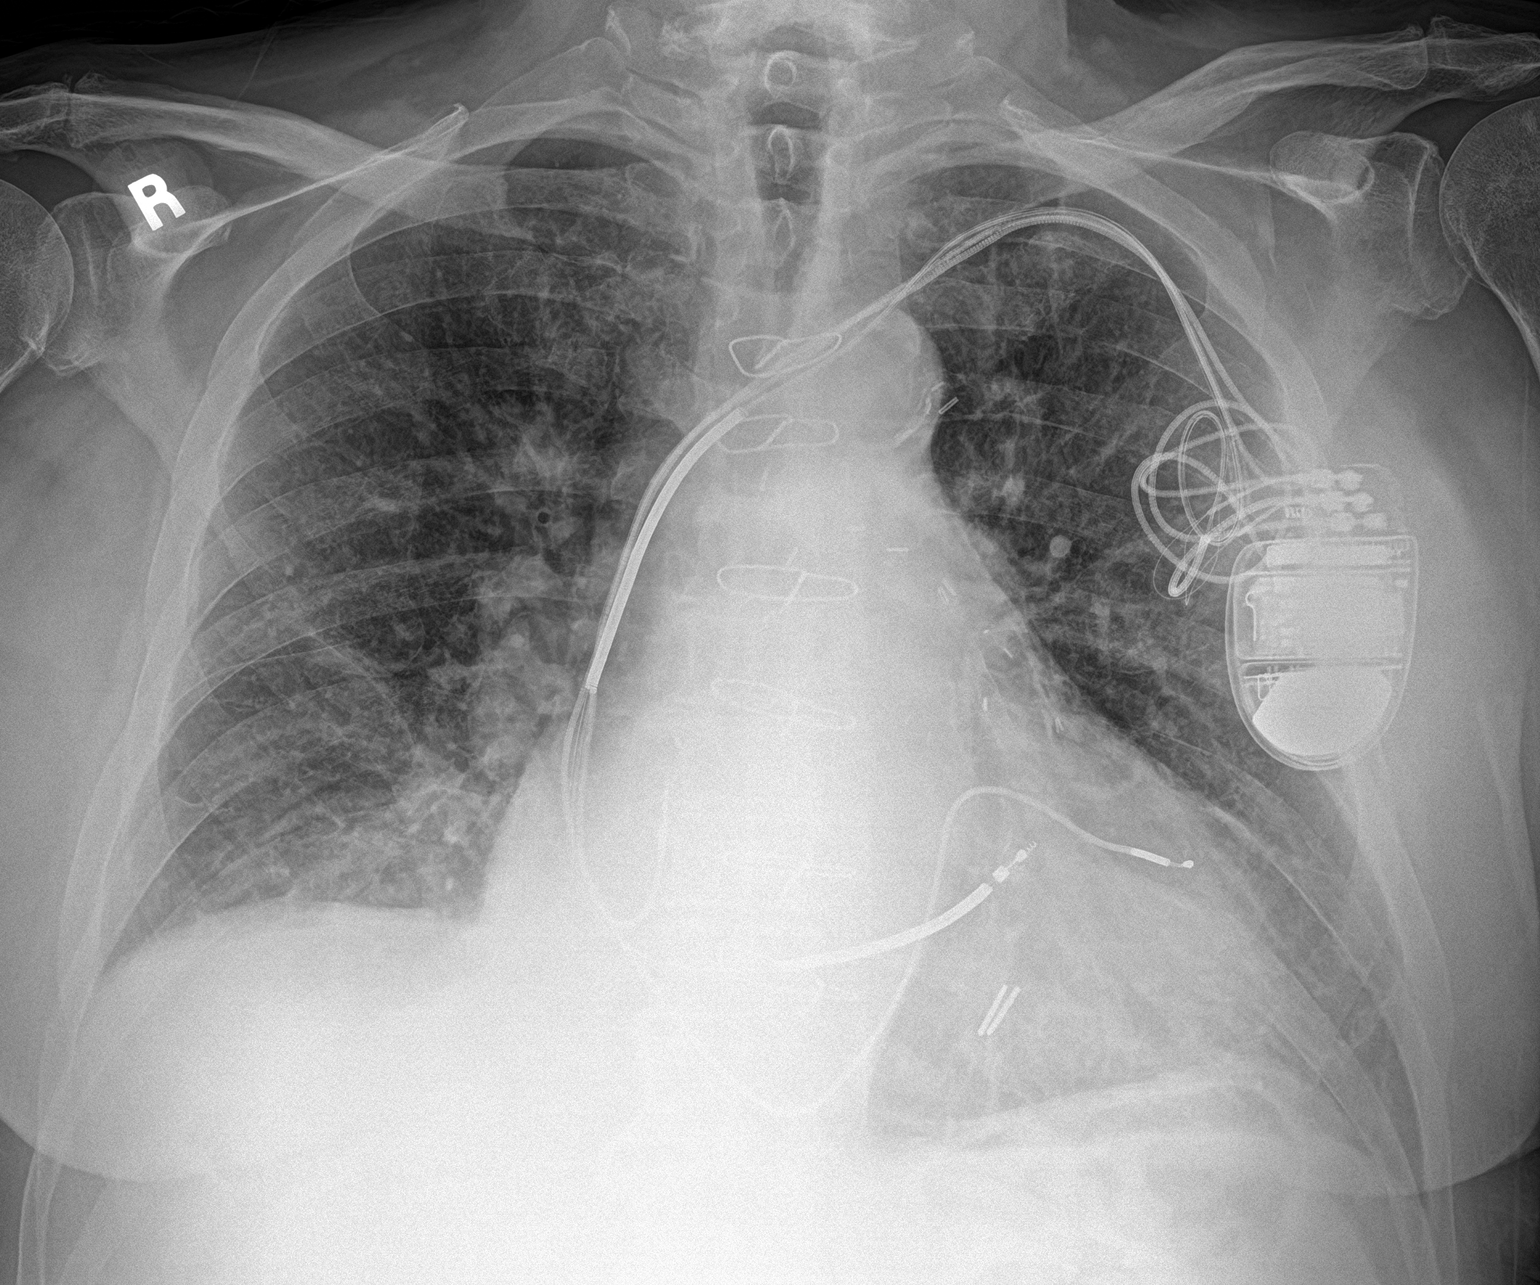

[2 of 2 positions shown; findings below may reference images not displayed]

FINDINGS: Previous median sternotomy and left subclavian defibrillator again
noted.

Stable cardiomegaly with vascular congestion, basilar atelectasis
and small effusions, larger on the right. No definite superimposed
edema pattern or focal pneumonia. No pneumothorax. Trachea midline.
Aorta atherosclerotic. Degenerative changes of the spine.
IMPRESSION: 1. Stable cardiomegaly with vascular congestion, basilar atelectasis
and small effusions.
2. No significant interval change.
